# Patient Record
Sex: Female | Born: 1950
Health system: Southern US, Community
[De-identification: ages and names within clinical notes are randomized; demographics above are authoritative.]

## PROBLEM LIST (undated history)

## (undated) ENCOUNTER — Emergency Department (HOSPITAL_COMMUNITY): Payer: Self-pay | Source: Home / Self Care

## (undated) DIAGNOSIS — R519 Headache, unspecified: Secondary | ICD-10-CM

## (undated) DIAGNOSIS — F419 Anxiety disorder, unspecified: Secondary | ICD-10-CM

## (undated) DIAGNOSIS — R079 Chest pain, unspecified: Secondary | ICD-10-CM

## (undated) DIAGNOSIS — K449 Diaphragmatic hernia without obstruction or gangrene: Secondary | ICD-10-CM

## (undated) DIAGNOSIS — I10 Essential (primary) hypertension: Secondary | ICD-10-CM

## (undated) DIAGNOSIS — G8929 Other chronic pain: Secondary | ICD-10-CM

## (undated) DIAGNOSIS — E669 Obesity, unspecified: Secondary | ICD-10-CM

## (undated) DIAGNOSIS — K5792 Diverticulitis of intestine, part unspecified, without perforation or abscess without bleeding: Secondary | ICD-10-CM

## (undated) DIAGNOSIS — E559 Vitamin D deficiency, unspecified: Secondary | ICD-10-CM

## (undated) DIAGNOSIS — R109 Unspecified abdominal pain: Secondary | ICD-10-CM

## (undated) DIAGNOSIS — I509 Heart failure, unspecified: Secondary | ICD-10-CM

## (undated) DIAGNOSIS — E039 Hypothyroidism, unspecified: Secondary | ICD-10-CM

## (undated) DIAGNOSIS — E1165 Type 2 diabetes mellitus with hyperglycemia: Secondary | ICD-10-CM

## (undated) DIAGNOSIS — R51 Headache: Secondary | ICD-10-CM

## (undated) DIAGNOSIS — M549 Dorsalgia, unspecified: Secondary | ICD-10-CM

## (undated) DIAGNOSIS — IMO0002 Reserved for concepts with insufficient information to code with codable children: Secondary | ICD-10-CM

## (undated) DIAGNOSIS — J189 Pneumonia, unspecified organism: Secondary | ICD-10-CM

## (undated) DIAGNOSIS — E785 Hyperlipidemia, unspecified: Secondary | ICD-10-CM

## (undated) DIAGNOSIS — G5600 Carpal tunnel syndrome, unspecified upper limb: Secondary | ICD-10-CM

## (undated) DIAGNOSIS — G5620 Lesion of ulnar nerve, unspecified upper limb: Secondary | ICD-10-CM

## (undated) DIAGNOSIS — G629 Polyneuropathy, unspecified: Secondary | ICD-10-CM

## (undated) DIAGNOSIS — N189 Chronic kidney disease, unspecified: Secondary | ICD-10-CM

## (undated) HISTORY — DX: Reserved for concepts with insufficient information to code with codable children: IMO0002

## (undated) HISTORY — DX: Vitamin D deficiency, unspecified: E55.9

## (undated) HISTORY — DX: Unspecified abdominal pain: R10.9

## (undated) HISTORY — DX: Hypothyroidism, unspecified: E03.9

## (undated) HISTORY — PX: THYROID SURGERY: SHX805

## (undated) HISTORY — PX: KNEE SURGERY: SHX244

## (undated) HISTORY — DX: Carpal tunnel syndrome, unspecified upper limb: G56.00

## (undated) HISTORY — DX: Type 2 diabetes mellitus with hyperglycemia: E11.65

## (undated) HISTORY — DX: Lesion of ulnar nerve, unspecified upper limb: G56.20

---

## 1998-01-24 ENCOUNTER — Inpatient Hospital Stay (HOSPITAL_COMMUNITY): Admission: AD | Admit: 1998-01-24 | Discharge: 1998-01-24 | Payer: Self-pay | Admitting: Obstetrics & Gynecology

## 1998-02-07 ENCOUNTER — Other Ambulatory Visit: Admission: RE | Admit: 1998-02-07 | Discharge: 1998-02-07 | Payer: Self-pay | Admitting: Obstetrics

## 1998-02-07 ENCOUNTER — Encounter: Admission: RE | Admit: 1998-02-07 | Discharge: 1998-02-07 | Payer: Self-pay | Admitting: Obstetrics

## 1998-02-20 ENCOUNTER — Ambulatory Visit (HOSPITAL_COMMUNITY): Admission: RE | Admit: 1998-02-20 | Discharge: 1998-02-20 | Payer: Self-pay | Admitting: Obstetrics

## 1998-07-08 ENCOUNTER — Encounter: Payer: Self-pay | Admitting: Emergency Medicine

## 1998-07-08 ENCOUNTER — Emergency Department (HOSPITAL_COMMUNITY): Admission: EM | Admit: 1998-07-08 | Discharge: 1998-07-09 | Payer: Self-pay | Admitting: Radiology

## 1998-11-25 ENCOUNTER — Ambulatory Visit (HOSPITAL_COMMUNITY): Admission: RE | Admit: 1998-11-25 | Discharge: 1998-11-25 | Payer: Self-pay | Admitting: Gynecology

## 1998-11-25 ENCOUNTER — Encounter: Payer: Self-pay | Admitting: Internal Medicine

## 1998-11-28 ENCOUNTER — Encounter: Admission: RE | Admit: 1998-11-28 | Discharge: 1999-01-01 | Payer: Self-pay | Admitting: Internal Medicine

## 1999-03-14 ENCOUNTER — Emergency Department (HOSPITAL_COMMUNITY): Admission: EM | Admit: 1999-03-14 | Discharge: 1999-03-15 | Payer: Self-pay | Admitting: Emergency Medicine

## 1999-11-17 ENCOUNTER — Encounter: Payer: Self-pay | Admitting: Internal Medicine

## 1999-11-17 ENCOUNTER — Ambulatory Visit (HOSPITAL_COMMUNITY): Admission: RE | Admit: 1999-11-17 | Discharge: 1999-11-17 | Payer: Self-pay | Admitting: Internal Medicine

## 2000-01-18 ENCOUNTER — Emergency Department (HOSPITAL_COMMUNITY): Admission: EM | Admit: 2000-01-18 | Discharge: 2000-01-18 | Payer: Self-pay | Admitting: Emergency Medicine

## 2000-01-18 ENCOUNTER — Emergency Department (HOSPITAL_COMMUNITY): Admission: EM | Admit: 2000-01-18 | Discharge: 2000-01-19 | Payer: Self-pay | Admitting: Emergency Medicine

## 2000-01-18 ENCOUNTER — Encounter: Payer: Self-pay | Admitting: Emergency Medicine

## 2000-01-31 ENCOUNTER — Encounter: Payer: Self-pay | Admitting: Emergency Medicine

## 2000-01-31 ENCOUNTER — Emergency Department (HOSPITAL_COMMUNITY): Admission: EM | Admit: 2000-01-31 | Discharge: 2000-01-31 | Payer: Self-pay | Admitting: Emergency Medicine

## 2000-05-15 ENCOUNTER — Ambulatory Visit (HOSPITAL_COMMUNITY): Admission: RE | Admit: 2000-05-15 | Discharge: 2000-05-15 | Payer: Self-pay | Admitting: *Deleted

## 2000-05-15 ENCOUNTER — Encounter: Payer: Self-pay | Admitting: *Deleted

## 2001-03-05 ENCOUNTER — Encounter: Payer: Self-pay | Admitting: Emergency Medicine

## 2001-03-05 ENCOUNTER — Emergency Department (HOSPITAL_COMMUNITY): Admission: EM | Admit: 2001-03-05 | Discharge: 2001-03-05 | Payer: Self-pay | Admitting: Emergency Medicine

## 2001-11-17 ENCOUNTER — Encounter: Payer: Self-pay | Admitting: Internal Medicine

## 2001-11-17 ENCOUNTER — Encounter: Admission: RE | Admit: 2001-11-17 | Discharge: 2001-11-17 | Payer: Self-pay | Admitting: Internal Medicine

## 2002-02-25 ENCOUNTER — Emergency Department (HOSPITAL_COMMUNITY): Admission: EM | Admit: 2002-02-25 | Discharge: 2002-02-25 | Payer: Self-pay | Admitting: Emergency Medicine

## 2002-04-10 ENCOUNTER — Emergency Department (HOSPITAL_COMMUNITY): Admission: EM | Admit: 2002-04-10 | Discharge: 2002-04-11 | Payer: Self-pay | Admitting: Emergency Medicine

## 2002-04-11 ENCOUNTER — Encounter: Payer: Self-pay | Admitting: Emergency Medicine

## 2002-04-14 ENCOUNTER — Encounter: Payer: Self-pay | Admitting: Internal Medicine

## 2002-04-14 ENCOUNTER — Encounter: Admission: RE | Admit: 2002-04-14 | Discharge: 2002-04-14 | Payer: Self-pay | Admitting: Internal Medicine

## 2002-04-24 ENCOUNTER — Emergency Department (HOSPITAL_COMMUNITY): Admission: EM | Admit: 2002-04-24 | Discharge: 2002-04-24 | Payer: Self-pay | Admitting: Emergency Medicine

## 2002-08-21 ENCOUNTER — Ambulatory Visit (HOSPITAL_BASED_OUTPATIENT_CLINIC_OR_DEPARTMENT_OTHER): Admission: RE | Admit: 2002-08-21 | Discharge: 2002-08-21 | Payer: Self-pay | Admitting: Orthopedic Surgery

## 2002-09-04 ENCOUNTER — Encounter: Admission: RE | Admit: 2002-09-04 | Discharge: 2002-11-27 | Payer: Self-pay | Admitting: Orthopedic Surgery

## 2002-11-19 ENCOUNTER — Emergency Department (HOSPITAL_COMMUNITY): Admission: EM | Admit: 2002-11-19 | Discharge: 2002-11-19 | Payer: Self-pay | Admitting: Emergency Medicine

## 2003-03-06 ENCOUNTER — Encounter: Admission: RE | Admit: 2003-03-06 | Discharge: 2003-03-21 | Payer: Self-pay | Admitting: Orthopedic Surgery

## 2003-03-19 ENCOUNTER — Other Ambulatory Visit: Admission: RE | Admit: 2003-03-19 | Discharge: 2003-03-19 | Payer: Self-pay | Admitting: Obstetrics and Gynecology

## 2003-03-22 ENCOUNTER — Ambulatory Visit (HOSPITAL_COMMUNITY): Admission: RE | Admit: 2003-03-22 | Discharge: 2003-03-22 | Payer: Self-pay | Admitting: Obstetrics and Gynecology

## 2003-03-22 ENCOUNTER — Encounter: Payer: Self-pay | Admitting: Obstetrics and Gynecology

## 2003-06-14 ENCOUNTER — Encounter: Payer: Self-pay | Admitting: Internal Medicine

## 2003-06-14 ENCOUNTER — Encounter: Admission: RE | Admit: 2003-06-14 | Discharge: 2003-06-14 | Payer: Self-pay | Admitting: Internal Medicine

## 2003-09-30 ENCOUNTER — Emergency Department (HOSPITAL_COMMUNITY): Admission: AD | Admit: 2003-09-30 | Discharge: 2003-10-01 | Payer: Self-pay | Admitting: Emergency Medicine

## 2003-10-29 ENCOUNTER — Emergency Department (HOSPITAL_COMMUNITY): Admission: EM | Admit: 2003-10-29 | Discharge: 2003-10-29 | Payer: Self-pay | Admitting: Emergency Medicine

## 2003-12-21 ENCOUNTER — Emergency Department (HOSPITAL_COMMUNITY): Admission: EM | Admit: 2003-12-21 | Discharge: 2003-12-22 | Payer: Self-pay | Admitting: Emergency Medicine

## 2004-01-20 ENCOUNTER — Emergency Department (HOSPITAL_COMMUNITY): Admission: EM | Admit: 2004-01-20 | Discharge: 2004-01-20 | Payer: Self-pay | Admitting: Emergency Medicine

## 2004-01-21 ENCOUNTER — Emergency Department (HOSPITAL_COMMUNITY): Admission: EM | Admit: 2004-01-21 | Discharge: 2004-01-22 | Payer: Self-pay | Admitting: Emergency Medicine

## 2004-02-15 ENCOUNTER — Emergency Department (HOSPITAL_COMMUNITY): Admission: EM | Admit: 2004-02-15 | Discharge: 2004-02-15 | Payer: Self-pay | Admitting: Family Medicine

## 2004-02-16 ENCOUNTER — Emergency Department (HOSPITAL_COMMUNITY): Admission: EM | Admit: 2004-02-16 | Discharge: 2004-02-16 | Payer: Self-pay | Admitting: Emergency Medicine

## 2004-04-30 ENCOUNTER — Encounter: Admission: RE | Admit: 2004-04-30 | Discharge: 2004-04-30 | Payer: Self-pay | Admitting: Internal Medicine

## 2004-05-09 ENCOUNTER — Ambulatory Visit (HOSPITAL_COMMUNITY): Admission: RE | Admit: 2004-05-09 | Discharge: 2004-05-09 | Payer: Self-pay | Admitting: Obstetrics & Gynecology

## 2004-11-27 ENCOUNTER — Ambulatory Visit (HOSPITAL_COMMUNITY): Admission: RE | Admit: 2004-11-27 | Discharge: 2004-11-27 | Payer: Self-pay | Admitting: Obstetrics & Gynecology

## 2005-03-07 ENCOUNTER — Emergency Department (HOSPITAL_COMMUNITY): Admission: EM | Admit: 2005-03-07 | Discharge: 2005-03-07 | Payer: Self-pay | Admitting: Emergency Medicine

## 2005-06-24 ENCOUNTER — Emergency Department (HOSPITAL_COMMUNITY): Admission: EM | Admit: 2005-06-24 | Discharge: 2005-06-24 | Payer: Self-pay | Admitting: Emergency Medicine

## 2005-07-28 ENCOUNTER — Emergency Department (HOSPITAL_COMMUNITY): Admission: EM | Admit: 2005-07-28 | Discharge: 2005-07-29 | Payer: Self-pay | Admitting: *Deleted

## 2005-10-06 ENCOUNTER — Encounter: Admission: RE | Admit: 2005-10-06 | Discharge: 2005-10-06 | Payer: Self-pay | Admitting: Obstetrics & Gynecology

## 2005-10-30 ENCOUNTER — Encounter: Admission: RE | Admit: 2005-10-30 | Discharge: 2005-10-30 | Payer: Self-pay | Admitting: Obstetrics & Gynecology

## 2006-03-25 ENCOUNTER — Emergency Department (HOSPITAL_COMMUNITY): Admission: EM | Admit: 2006-03-25 | Discharge: 2006-03-25 | Payer: Self-pay | Admitting: Emergency Medicine

## 2006-09-29 ENCOUNTER — Emergency Department (HOSPITAL_COMMUNITY): Admission: EM | Admit: 2006-09-29 | Discharge: 2006-09-29 | Payer: Self-pay | Admitting: Emergency Medicine

## 2007-02-10 ENCOUNTER — Emergency Department (HOSPITAL_COMMUNITY): Admission: EM | Admit: 2007-02-10 | Discharge: 2007-02-10 | Payer: Self-pay | Admitting: Emergency Medicine

## 2007-04-10 ENCOUNTER — Emergency Department (HOSPITAL_COMMUNITY): Admission: EM | Admit: 2007-04-10 | Discharge: 2007-04-11 | Payer: Self-pay | Admitting: Emergency Medicine

## 2007-04-10 ENCOUNTER — Emergency Department (HOSPITAL_COMMUNITY): Admission: EM | Admit: 2007-04-10 | Discharge: 2007-04-10 | Payer: Self-pay | Admitting: Emergency Medicine

## 2007-07-22 ENCOUNTER — Emergency Department (HOSPITAL_COMMUNITY): Admission: EM | Admit: 2007-07-22 | Discharge: 2007-07-22 | Payer: Self-pay | Admitting: Emergency Medicine

## 2007-08-31 ENCOUNTER — Emergency Department (HOSPITAL_COMMUNITY): Admission: EM | Admit: 2007-08-31 | Discharge: 2007-08-31 | Payer: Self-pay | Admitting: Emergency Medicine

## 2007-12-15 ENCOUNTER — Emergency Department (HOSPITAL_COMMUNITY): Admission: EM | Admit: 2007-12-15 | Discharge: 2007-12-16 | Payer: Self-pay | Admitting: Emergency Medicine

## 2008-01-13 ENCOUNTER — Emergency Department (HOSPITAL_COMMUNITY): Admission: EM | Admit: 2008-01-13 | Discharge: 2008-01-14 | Payer: Self-pay | Admitting: Emergency Medicine

## 2008-05-23 ENCOUNTER — Encounter: Admission: RE | Admit: 2008-05-23 | Discharge: 2008-05-23 | Payer: Self-pay | Admitting: Internal Medicine

## 2008-11-03 ENCOUNTER — Emergency Department (HOSPITAL_COMMUNITY): Admission: EM | Admit: 2008-11-03 | Discharge: 2008-11-03 | Payer: Self-pay | Admitting: Emergency Medicine

## 2008-12-18 ENCOUNTER — Emergency Department (HOSPITAL_COMMUNITY): Admission: EM | Admit: 2008-12-18 | Discharge: 2008-12-18 | Payer: Self-pay | Admitting: Emergency Medicine

## 2009-07-05 ENCOUNTER — Emergency Department (HOSPITAL_COMMUNITY): Admission: EM | Admit: 2009-07-05 | Discharge: 2009-07-05 | Payer: Self-pay | Admitting: Emergency Medicine

## 2009-07-08 ENCOUNTER — Emergency Department (HOSPITAL_COMMUNITY): Admission: EM | Admit: 2009-07-08 | Discharge: 2009-07-08 | Payer: Self-pay | Admitting: Emergency Medicine

## 2009-08-14 ENCOUNTER — Emergency Department (HOSPITAL_COMMUNITY): Admission: EM | Admit: 2009-08-14 | Discharge: 2009-08-14 | Payer: Self-pay | Admitting: Emergency Medicine

## 2009-08-17 ENCOUNTER — Emergency Department (HOSPITAL_COMMUNITY): Admission: EM | Admit: 2009-08-17 | Discharge: 2009-08-17 | Payer: Self-pay | Admitting: Emergency Medicine

## 2009-08-20 ENCOUNTER — Emergency Department (HOSPITAL_COMMUNITY): Admission: EM | Admit: 2009-08-20 | Discharge: 2009-08-20 | Payer: Self-pay | Admitting: Emergency Medicine

## 2009-09-12 ENCOUNTER — Ambulatory Visit (HOSPITAL_COMMUNITY): Admission: RE | Admit: 2009-09-12 | Discharge: 2009-09-12 | Payer: Self-pay | Admitting: Obstetrics

## 2009-10-03 ENCOUNTER — Emergency Department (HOSPITAL_COMMUNITY): Admission: EM | Admit: 2009-10-03 | Discharge: 2009-10-03 | Payer: Self-pay | Admitting: Emergency Medicine

## 2009-11-03 ENCOUNTER — Emergency Department (HOSPITAL_COMMUNITY): Admission: EM | Admit: 2009-11-03 | Discharge: 2009-11-03 | Payer: Self-pay | Admitting: Emergency Medicine

## 2009-11-19 ENCOUNTER — Emergency Department (HOSPITAL_BASED_OUTPATIENT_CLINIC_OR_DEPARTMENT_OTHER): Admission: EM | Admit: 2009-11-19 | Discharge: 2009-11-19 | Payer: Self-pay | Admitting: Emergency Medicine

## 2010-01-17 ENCOUNTER — Emergency Department (HOSPITAL_COMMUNITY): Admission: EM | Admit: 2010-01-17 | Discharge: 2010-01-17 | Payer: Self-pay | Admitting: Emergency Medicine

## 2010-04-30 ENCOUNTER — Ambulatory Visit: Payer: Self-pay | Admitting: Internal Medicine

## 2010-09-24 ENCOUNTER — Encounter (INDEPENDENT_AMBULATORY_CARE_PROVIDER_SITE_OTHER): Payer: Self-pay | Admitting: *Deleted

## 2010-09-24 LAB — CONVERTED CEMR LAB
ALT: 13 units/L (ref 0–35)
AST: 15 units/L (ref 0–37)
Albumin: 4.2 g/dL (ref 3.5–5.2)
Alkaline Phosphatase: 41 units/L (ref 39–117)
Calcium: 8.6 mg/dL (ref 8.4–10.5)
Chloride: 104 meq/L (ref 96–112)
HCT: 39.4 % (ref 36.0–46.0)
HDL: 46 mg/dL (ref >39)
LDL Cholesterol: 62 mg/dL (ref 0–99)
MCHC: 33 g/dL (ref 30.0–36.0)
Platelets: 230 10*3/uL (ref 150–400)
Potassium: 4.4 meq/L (ref 3.5–5.3)
RDW: 13.5 % (ref 11.5–15.5)
TSH: 2.331 microintl units/mL (ref 0.350–4.500)
Triglycerides: 108 mg/dL (ref <150)

## 2010-10-11 ENCOUNTER — Encounter: Payer: Self-pay | Admitting: Obstetrics & Gynecology

## 2010-10-12 ENCOUNTER — Encounter: Payer: Self-pay | Admitting: Internal Medicine

## 2010-10-12 ENCOUNTER — Encounter: Payer: Self-pay | Admitting: Obstetrics & Gynecology

## 2010-10-13 ENCOUNTER — Encounter: Payer: Self-pay | Admitting: Internal Medicine

## 2010-11-18 ENCOUNTER — Institutional Professional Consult (permissible substitution): Payer: Self-pay | Admitting: Cardiovascular Disease

## 2010-12-09 LAB — GLUCOSE, CAPILLARY
Glucose-Capillary: 428 mg/dL — ABNORMAL HIGH (ref 70–99)
Glucose-Capillary: 517 mg/dL — ABNORMAL HIGH (ref 70–99)

## 2010-12-10 LAB — GLUCOSE, CAPILLARY

## 2010-12-15 LAB — CBC
HCT: 41.2 % (ref 36.0–46.0)
MCHC: 34.2 g/dL (ref 30.0–36.0)
MCV: 87.4 fL (ref 78.0–100.0)
Platelets: 263 10*3/uL (ref 150–400)
RDW: 12.3 % (ref 11.5–15.5)
WBC: 9.4 10*3/uL (ref 4.0–10.5)

## 2010-12-15 LAB — DIFFERENTIAL
Basophils Absolute: 0 10*3/uL (ref 0.0–0.1)
Basophils Relative: 0 % (ref 0–1)
Eosinophils Absolute: 0.1 10*3/uL (ref 0.0–0.7)
Eosinophils Relative: 1 % (ref 0–5)
Lymphs Abs: 1.7 10*3/uL (ref 0.7–4.0)
Neutrophils Relative %: 73 % (ref 43–77)

## 2010-12-15 LAB — URINALYSIS, ROUTINE W REFLEX MICROSCOPIC
Glucose, UA: >1000 mg/dL — AB
Ketones, ur: NEGATIVE mg/dL
Leukocytes, UA: NEGATIVE
Protein, ur: NEGATIVE mg/dL
Urobilinogen, UA: 0.2 mg/dL (ref 0.0–1.0)

## 2010-12-15 LAB — URINE MICROSCOPIC-ADD ON

## 2010-12-15 LAB — BASIC METABOLIC PANEL
BUN: 18 mg/dL (ref 6–23)
CO2: 32 mEq/L (ref 19–32)
Chloride: 98 mEq/L (ref 96–112)
Creatinine, Ser: 0.7 mg/dL (ref 0.4–1.2)
Glucose, Bld: 281 mg/dL — ABNORMAL HIGH (ref 70–99)
Potassium: 4.6 mEq/L (ref 3.5–5.1)

## 2010-12-24 LAB — DIFFERENTIAL
Basophils Absolute: 0 10*3/uL (ref 0.0–0.1)
Basophils Relative: 0 % (ref 0–1)
Eosinophils Absolute: 0.1 10*3/uL (ref 0.0–0.7)
Eosinophils Relative: 1 % (ref 0–5)
Monocytes Absolute: 0.6 10*3/uL (ref 0.1–1.0)
Neutro Abs: 4.5 10*3/uL (ref 1.7–7.7)

## 2010-12-24 LAB — BASIC METABOLIC PANEL
CO2: 28 mEq/L (ref 19–32)
Calcium: 9.4 mg/dL (ref 8.4–10.5)
Chloride: 102 mEq/L (ref 96–112)
Glucose, Bld: 295 mg/dL — ABNORMAL HIGH (ref 70–99)
Sodium: 138 mEq/L (ref 135–145)

## 2010-12-24 LAB — CBC
Hemoglobin: 13.3 g/dL (ref 12.0–15.0)
MCHC: 34.2 g/dL (ref 30.0–36.0)
MCV: 88 fL (ref 78.0–100.0)
RDW: 12.9 % (ref 11.5–15.5)

## 2010-12-24 LAB — GLUCOSE, CAPILLARY

## 2010-12-25 LAB — DIFFERENTIAL
Basophils Absolute: 0 10*3/uL (ref 0.0–0.1)
Basophils Relative: 0 % (ref 0–1)
Eosinophils Relative: 1 % (ref 0–5)
Lymphocytes Relative: 29 % (ref 12–46)
Monocytes Absolute: 0.7 10*3/uL (ref 0.1–1.0)

## 2010-12-25 LAB — BASIC METABOLIC PANEL
Chloride: 104 mEq/L (ref 96–112)
GFR calc Af Amer: >60 mL/min (ref >60)
GFR calc non Af Amer: >60 mL/min (ref >60)
Potassium: 3.5 mEq/L (ref 3.5–5.1)
Sodium: 138 mEq/L (ref 135–145)

## 2010-12-25 LAB — CBC
HCT: 36.6 % (ref 36.0–46.0)
Hemoglobin: 12.6 g/dL (ref 12.0–15.0)
MCHC: 34.4 g/dL (ref 30.0–36.0)
RDW: 12.9 % (ref 11.5–15.5)

## 2010-12-25 LAB — GLUCOSE, CAPILLARY

## 2011-01-09 ENCOUNTER — Ambulatory Visit (INDEPENDENT_AMBULATORY_CARE_PROVIDER_SITE_OTHER): Payer: Self-pay

## 2011-01-09 ENCOUNTER — Inpatient Hospital Stay (INDEPENDENT_AMBULATORY_CARE_PROVIDER_SITE_OTHER)
Admission: RE | Admit: 2011-01-09 | Discharge: 2011-01-09 | Disposition: A | Discharge status: Home / Self Care | Payer: Self-pay | Source: Ambulatory Visit | Attending: Family Medicine | Admitting: Family Medicine

## 2011-01-09 ENCOUNTER — Encounter (HOSPITAL_COMMUNITY): Payer: Self-pay

## 2011-01-09 DIAGNOSIS — J40 Bronchitis, not specified as acute or chronic: Secondary | ICD-10-CM

## 2011-01-21 ENCOUNTER — Emergency Department (HOSPITAL_COMMUNITY): Payer: Self-pay

## 2011-01-21 ENCOUNTER — Emergency Department (HOSPITAL_COMMUNITY)
Admission: EM | Admit: 2011-01-21 | Discharge: 2011-01-21 | Disposition: A | Discharge status: Home / Self Care | Payer: Self-pay | Attending: Emergency Medicine | Admitting: Emergency Medicine

## 2011-01-21 DIAGNOSIS — R1011 Right upper quadrant pain: Secondary | ICD-10-CM | POA: Insufficient documentation

## 2011-01-21 DIAGNOSIS — Z794 Long term (current) use of insulin: Secondary | ICD-10-CM | POA: Insufficient documentation

## 2011-01-21 DIAGNOSIS — E039 Hypothyroidism, unspecified: Secondary | ICD-10-CM | POA: Insufficient documentation

## 2011-01-21 DIAGNOSIS — E119 Type 2 diabetes mellitus without complications: Secondary | ICD-10-CM | POA: Insufficient documentation

## 2011-01-21 DIAGNOSIS — R079 Chest pain, unspecified: Secondary | ICD-10-CM | POA: Insufficient documentation

## 2011-01-21 DIAGNOSIS — I10 Essential (primary) hypertension: Secondary | ICD-10-CM | POA: Insufficient documentation

## 2011-01-21 LAB — COMPREHENSIVE METABOLIC PANEL
AST: 17 U/L (ref 0–37)
Albumin: 4.2 g/dL (ref 3.5–5.2)
BUN: 21 mg/dL (ref 6–23)
Calcium: 10.1 mg/dL (ref 8.4–10.5)
Creatinine, Ser: 0.94 mg/dL (ref 0.4–1.2)
GFR calc Af Amer: >60 mL/min (ref >60)
Total Protein: 6.7 g/dL (ref 6.0–8.3)

## 2011-01-21 LAB — DIFFERENTIAL
Eosinophils Relative: 2 % (ref 0–5)
Lymphocytes Relative: 36 % (ref 12–46)
Lymphs Abs: 2.2 10*3/uL (ref 0.7–4.0)
Neutro Abs: 3.1 10*3/uL (ref 1.7–7.7)

## 2011-01-21 LAB — CBC
HCT: 36.6 % (ref 36.0–46.0)
Hemoglobin: 12.5 g/dL (ref 12.0–15.0)
MCV: 85.7 fL (ref 78.0–100.0)
RDW: 12.8 % (ref 11.5–15.5)
WBC: 6.1 10*3/uL (ref 4.0–10.5)

## 2011-01-21 LAB — URINALYSIS, ROUTINE W REFLEX MICROSCOPIC
Bilirubin Urine: NEGATIVE
Glucose, UA: 500 mg/dL — AB
Hgb urine dipstick: NEGATIVE
Specific Gravity, Urine: 1.034 — ABNORMAL HIGH (ref 1.005–1.030)
Urobilinogen, UA: 0.2 mg/dL (ref 0.0–1.0)
pH: 5 (ref 5.0–8.0)

## 2011-01-21 LAB — URINE MICROSCOPIC-ADD ON

## 2011-01-21 LAB — GLUCOSE, CAPILLARY: Glucose-Capillary: 195 mg/dL — ABNORMAL HIGH (ref 70–99)

## 2011-01-22 LAB — URINE CULTURE
Culture  Setup Time: 201205020855
Culture: NO GROWTH

## 2011-02-06 NOTE — Op Note (Signed)
   NAME:  Anita James, Anita James                           ACCOUNT NO.:  [id number]   MEDICAL RECORD NO.:  [id number]                   PATIENT TYPE:  AMB   LOCATION:  DSC                                  FACILITY:  [facility]   PHYSICIAN:  Harvie Junior, M.D.                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   POSTOPERATIVE DIAGNOSES:  1. Chondromalacia patella.  2. Medial plica.  3. Tight lateral retinaculum.   PROCEDURES:  1. Debridement of chondromalacia patella.  2. Debridement of medial plica.  3. Lateral retinacular release.   SURGEON:  Dr. Luiz Blare.   ANESTHESIA:  General.   BRIEF HISTORY:  This is a 59 year old female with a long history of having  right knee pain.  She ultimately had an arthroscopy previously by Dr.  Magdalene River who ultimately found a lateral meniscal tear.  Because of  continued complaints of pain, and more recent onset of medial side pain, she  was brought to the operating room for operative arthroscopic.   DESCRIPTION OF PROCEDURE:  The patient was brought to the operating room,  after adequate general anesthesia was obtained, the patient was placed on  the operating table where the right leg then prepped and draped in the usual  sterile fashion.  Following arthroscopic examination, there was an obvious  medial plica, this was debrided back to a smooth, stable rim and there was  obvious chondral injury where the plica was abrading the femoral condyle.  The medial facet of the patella had significant chondromalacia, this was  debrided back to smooth and stable rim, this was a grade 4 change.  Because  of the grade 4 change and the lateral patellar tracking, tight lateral  retinaculum, the patient ultimately underwent a lateral retinacular release,  this was done arthroscopically  without significant abnormality.  At this  point, the knee was copiously irrigated, suctioned dry and the arthroscopic  portals were closed with bandage and sterile compressive dressing was  applied.  The patient was taken to the recovery room and was noted to be  satisfactory condition.   ESTIMATED BLOOD LOSS:  None.                                               Harvie Junior, M.D.    Ranae Plumber  D:  08/21/2002  T:  08/21/2002  Job:  [id number]

## 2011-05-29 ENCOUNTER — Inpatient Hospital Stay (INDEPENDENT_AMBULATORY_CARE_PROVIDER_SITE_OTHER)
Admission: RE | Admit: 2011-05-29 | Discharge: 2011-05-29 | Disposition: A | Discharge status: Home / Self Care | Payer: Self-pay | Source: Ambulatory Visit | Attending: Family Medicine | Admitting: Family Medicine

## 2011-05-29 DIAGNOSIS — L03211 Cellulitis of face: Secondary | ICD-10-CM

## 2011-06-15 LAB — CBC
HCT: 38.6
MCV: 87.1
Platelets: 258
RDW: 12.8
WBC: 7.3

## 2011-06-15 LAB — DIFFERENTIAL
Basophils Absolute: 0
Eosinophils Absolute: 0
Eosinophils Relative: 1
Lymphs Abs: 1.9
Neutrophils Relative %: 63

## 2011-06-15 LAB — BASIC METABOLIC PANEL
BUN: 11
Chloride: 101
Creatinine, Ser: 0.76
GFR calc non Af Amer: >60
Glucose, Bld: 363 — ABNORMAL HIGH
Potassium: 4.2

## 2011-06-15 LAB — D-DIMER, QUANTITATIVE: D-Dimer, Quant: 0.33

## 2011-06-15 LAB — POCT CARDIAC MARKERS: Operator id: 1192

## 2011-06-16 LAB — BASIC METABOLIC PANEL
Calcium: 9.2
GFR calc Af Amer: >60
GFR calc non Af Amer: >60
Glucose, Bld: 136 — ABNORMAL HIGH
Potassium: 4.1
Sodium: 139

## 2011-06-16 LAB — URINE MICROSCOPIC-ADD ON

## 2011-06-16 LAB — CBC
HCT: 40.8
Hemoglobin: 14.5
RBC: 4.65
RDW: 12.8
WBC: 9.3

## 2011-06-16 LAB — URINALYSIS, ROUTINE W REFLEX MICROSCOPIC
Bilirubin Urine: NEGATIVE
Glucose, UA: NEGATIVE
Hgb urine dipstick: NEGATIVE
Ketones, ur: NEGATIVE
Specific Gravity, Urine: 1.029
pH: 6

## 2011-06-16 LAB — DIFFERENTIAL
Basophils Absolute: 0
Lymphocytes Relative: 9 — ABNORMAL LOW
Lymphs Abs: 0.8
Monocytes Absolute: 0.8
Neutro Abs: 7.6

## 2011-06-16 LAB — POCT CARDIAC MARKERS
CKMB, poc: 1.1
Troponin i, poc: <0.05

## 2011-06-29 LAB — URINE MICROSCOPIC-ADD ON

## 2011-06-29 LAB — URINALYSIS, ROUTINE W REFLEX MICROSCOPIC
Nitrite: NEGATIVE
Specific Gravity, Urine: 1.028
pH: 8

## 2011-06-29 LAB — URINE CULTURE

## 2011-07-01 LAB — ETHANOL: Alcohol, Ethyl (B): <5

## 2011-07-06 LAB — URINALYSIS, ROUTINE W REFLEX MICROSCOPIC
Bilirubin Urine: NEGATIVE
Nitrite: NEGATIVE
Specific Gravity, Urine: 1.018
Urobilinogen, UA: 1

## 2011-07-06 LAB — URINE MICROSCOPIC-ADD ON

## 2011-07-24 ENCOUNTER — Emergency Department (HOSPITAL_COMMUNITY)
Admission: EM | Admit: 2011-07-24 | Discharge: 2011-07-25 | Disposition: A | Discharge status: Home / Self Care | Payer: Self-pay | Attending: Emergency Medicine | Admitting: Emergency Medicine

## 2011-07-24 ENCOUNTER — Emergency Department (HOSPITAL_COMMUNITY): Payer: Self-pay

## 2011-07-24 DIAGNOSIS — E039 Hypothyroidism, unspecified: Secondary | ICD-10-CM | POA: Insufficient documentation

## 2011-07-24 DIAGNOSIS — M25519 Pain in unspecified shoulder: Secondary | ICD-10-CM | POA: Insufficient documentation

## 2011-07-24 DIAGNOSIS — Z79899 Other long term (current) drug therapy: Secondary | ICD-10-CM | POA: Insufficient documentation

## 2011-07-24 DIAGNOSIS — E119 Type 2 diabetes mellitus without complications: Secondary | ICD-10-CM | POA: Insufficient documentation

## 2011-07-24 DIAGNOSIS — I10 Essential (primary) hypertension: Secondary | ICD-10-CM | POA: Insufficient documentation

## 2011-07-24 DIAGNOSIS — E7800 Pure hypercholesterolemia, unspecified: Secondary | ICD-10-CM | POA: Insufficient documentation

## 2011-07-24 DIAGNOSIS — R071 Chest pain on breathing: Secondary | ICD-10-CM | POA: Insufficient documentation

## 2011-07-24 DIAGNOSIS — R0602 Shortness of breath: Secondary | ICD-10-CM | POA: Insufficient documentation

## 2011-07-24 LAB — DIFFERENTIAL
Basophils Absolute: 0 10*3/uL (ref 0.0–0.1)
Eosinophils Relative: 1 % (ref 0–5)
Lymphocytes Relative: 33 % (ref 12–46)
Monocytes Absolute: 0.7 10*3/uL (ref 0.1–1.0)

## 2011-07-24 LAB — BASIC METABOLIC PANEL
CO2: 25 mEq/L (ref 19–32)
Calcium: 9.9 mg/dL (ref 8.4–10.5)
Glucose, Bld: 230 mg/dL — ABNORMAL HIGH (ref 70–99)
Potassium: 3.4 mEq/L — ABNORMAL LOW (ref 3.5–5.1)
Sodium: 138 mEq/L (ref 135–145)

## 2011-07-24 LAB — CBC
HCT: 39.1 % (ref 36.0–46.0)
MCHC: 34.8 g/dL (ref 30.0–36.0)
RDW: 12.9 % (ref 11.5–15.5)

## 2011-07-24 LAB — PROTIME-INR: INR: 0.94 (ref 0.00–1.49)

## 2011-07-25 LAB — POCT I-STAT TROPONIN I: Troponin i, poc: 0 ng/mL (ref 0.00–0.08)

## 2011-08-07 ENCOUNTER — Emergency Department (HOSPITAL_BASED_OUTPATIENT_CLINIC_OR_DEPARTMENT_OTHER)
Admission: EM | Admit: 2011-08-07 | Discharge: 2011-08-08 | Disposition: A | Discharge status: Home / Self Care | Payer: Self-pay | Attending: Emergency Medicine | Admitting: Emergency Medicine

## 2011-08-07 ENCOUNTER — Encounter (HOSPITAL_BASED_OUTPATIENT_CLINIC_OR_DEPARTMENT_OTHER): Payer: Self-pay | Admitting: Emergency Medicine

## 2011-08-07 ENCOUNTER — Emergency Department (INDEPENDENT_AMBULATORY_CARE_PROVIDER_SITE_OTHER): Payer: Self-pay

## 2011-08-07 DIAGNOSIS — K5792 Diverticulitis of intestine, part unspecified, without perforation or abscess without bleeding: Secondary | ICD-10-CM

## 2011-08-07 DIAGNOSIS — E119 Type 2 diabetes mellitus without complications: Secondary | ICD-10-CM | POA: Insufficient documentation

## 2011-08-07 DIAGNOSIS — E785 Hyperlipidemia, unspecified: Secondary | ICD-10-CM | POA: Insufficient documentation

## 2011-08-07 DIAGNOSIS — I10 Essential (primary) hypertension: Secondary | ICD-10-CM | POA: Insufficient documentation

## 2011-08-07 DIAGNOSIS — R350 Frequency of micturition: Secondary | ICD-10-CM | POA: Insufficient documentation

## 2011-08-07 DIAGNOSIS — R109 Unspecified abdominal pain: Secondary | ICD-10-CM | POA: Insufficient documentation

## 2011-08-07 DIAGNOSIS — K5732 Diverticulitis of large intestine without perforation or abscess without bleeding: Secondary | ICD-10-CM | POA: Insufficient documentation

## 2011-08-07 DIAGNOSIS — E079 Disorder of thyroid, unspecified: Secondary | ICD-10-CM | POA: Insufficient documentation

## 2011-08-07 DIAGNOSIS — R300 Dysuria: Secondary | ICD-10-CM

## 2011-08-07 HISTORY — DX: Diaphragmatic hernia without obstruction or gangrene: K44.9

## 2011-08-07 HISTORY — DX: Essential (primary) hypertension: I10

## 2011-08-07 HISTORY — DX: Hyperlipidemia, unspecified: E78.5

## 2011-08-07 LAB — URINALYSIS, ROUTINE W REFLEX MICROSCOPIC
Bilirubin Urine: NEGATIVE
Glucose, UA: 100 mg/dL — AB
Ketones, ur: 15 mg/dL — AB
Nitrite: NEGATIVE
Protein, ur: 100 mg/dL — AB
Specific Gravity, Urine: 1.031 — ABNORMAL HIGH (ref 1.005–1.030)
Urobilinogen, UA: 1 mg/dL (ref 0.0–1.0)
pH: 5.5 (ref 5.0–8.0)

## 2011-08-07 LAB — BASIC METABOLIC PANEL
BUN: 11 mg/dL (ref 6–23)
CO2: 26 mEq/L (ref 19–32)
Calcium: 9.1 mg/dL (ref 8.4–10.5)
Chloride: 100 mEq/L (ref 96–112)
Creatinine, Ser: 0.6 mg/dL (ref 0.50–1.10)
GFR calc Af Amer: >90 mL/min (ref >90)
GFR calc non Af Amer: >90 mL/min (ref >90)
Glucose, Bld: 285 mg/dL — ABNORMAL HIGH (ref 70–99)
Potassium: 4.3 mEq/L (ref 3.5–5.1)
Sodium: 138 mEq/L (ref 135–145)

## 2011-08-07 LAB — URINE MICROSCOPIC-ADD ON

## 2011-08-07 LAB — CBC
HCT: 38.8 % (ref 36.0–46.0)
Hemoglobin: 13.5 g/dL (ref 12.0–15.0)
MCH: 29.3 pg (ref 26.0–34.0)
MCHC: 34.8 g/dL (ref 30.0–36.0)
MCV: 84.3 fL (ref 78.0–100.0)
Platelets: 260 10*3/uL (ref 150–400)
RBC: 4.6 MIL/uL (ref 3.87–5.11)
RDW: 13 % (ref 11.5–15.5)
WBC: 9.2 10*3/uL (ref 4.0–10.5)

## 2011-08-07 MED ORDER — METRONIDAZOLE 500 MG PO TABS: 500.0000 mg | ORAL_TABLET | Freq: Three times a day (TID) | ORAL | Status: DC

## 2011-08-07 MED ORDER — CIPROFLOXACIN IN D5W 400 MG/200ML IV SOLN
400.0000 mg | Freq: Once | INTRAVENOUS | Status: AC
  Administered 2011-08-07: 400 mg via INTRAVENOUS
  Filled 2011-08-07: qty 200

## 2011-08-07 MED ORDER — METRONIDAZOLE IN NACL 5-0.79 MG/ML-% IV SOLN
500.0000 mg | Freq: Once | INTRAVENOUS | Status: AC
  Administered 2011-08-07: 500 mg via INTRAVENOUS
  Filled 2011-08-07: qty 100

## 2011-08-07 MED ORDER — SODIUM CHLORIDE 0.9 % IV BOLUS (SEPSIS)
1000.0000 mL | Freq: Once | INTRAVENOUS | Status: AC
  Administered 2011-08-07: 1000 mL via INTRAVENOUS

## 2011-08-07 MED ORDER — MORPHINE SULFATE 4 MG/ML IJ SOLN
6.0000 mg | Freq: Once | INTRAMUSCULAR | Status: AC
  Administered 2011-08-07: 6 mg via INTRAVENOUS
  Filled 2011-08-07: qty 2

## 2011-08-07 MED ORDER — CIPROFLOXACIN HCL 500 MG PO TABS: 500.0000 mg | ORAL_TABLET | Freq: Two times a day (BID) | ORAL | Status: DC

## 2011-08-07 MED ORDER — OXYCODONE-ACETAMINOPHEN 5-325 MG PO TABS
1.0000 | ORAL_TABLET | Freq: Once | ORAL | Status: AC
  Administered 2011-08-07: 1 via ORAL
  Filled 2011-08-07: qty 1

## 2011-08-07 NOTE — ED Notes (Signed)
Pt c/o urinary frequency and dysuria

## 2011-08-07 NOTE — ED Notes (Signed)
Dr Juleen China informed that the patient has called out again requesting pain medications.  No orders received.

## 2011-08-07 NOTE — ED Notes (Signed)
Pt has called out requesting pain medication, Dr Juleen China informed, no orders received.

## 2011-08-07 NOTE — ED Notes (Addendum)
When reassessing pt's pain after admin of pain medication, pt was asked to rate her pain on a 0-10 scale.  Pt stated, "I'm still hurting"  When asked again what number her pain was on a scale of 0-10 she quipped, "I told you I am still hurting, that means it is a 10/10."

## 2011-08-07 NOTE — ED Notes (Signed)
Pt changed to observation status while IV abx continue infusing.  Will discharge patient as soon those abx infusions are complete.

## 2011-08-08 NOTE — ED Provider Notes (Signed)
History    70yf with lower abdominal pain. Onset shortly before arrival. Feels a lot of pressure. " I feel like there is something coming out." No urinary complaints. Has not felt mass or bulge. Denies hx of prolapse. No hx of similar symptoms. No n/v. No fever or chills. Pain has been relatively constant since onset. Feels pressure like needs to urinate. Uncomfortable when pees. Denies hx of kidney stones. No blood in urine. CSN: [id number] Arrival date & time: 08/07/2011  7:29 PM   First MD Initiated Contact with Patient 08/07/11 1931      Chief Complaint  Patient presents with  . Dysuria  . Urinary Frequency    (Consider location/radiation/quality/duration/timing/severity/associated sxs/prior treatment) HPI  Past Medical History  Diagnosis Date  . Diabetes mellitus   . Hypertension   . Thyroid disease   . Hiatal hernia   . Hyperlipemia     Past Surgical History  Procedure Date  . Cesarean section   . Knee surgery   . Thyroid surgery     History reviewed. No pertinent family history.  History  Substance Use Topics  . Smoking status: Never Smoker   . Smokeless tobacco: Not on file  . Alcohol Use: No    OB History    Grav Para Term Preterm Abortions TAB SAB Ect Mult Living                  Review of Systems   Review of symptoms negative unless otherwise noted in HPI.   Allergies  Sulfa antibiotics and Augmentin  Home Medications   Current Outpatient Rx  Name Route Sig Dispense Refill  . CIPROFLOXACIN HCL 500 MG PO TABS Oral Take 1 tablet (500 mg total) by mouth 2 (two) times daily. 14 tablet 0  . METRONIDAZOLE 500 MG PO TABS Oral Take 1 tablet (500 mg total) by mouth 3 (three) times daily. 21 tablet 0    BP 144/77  Pulse 74  Temp(Src) 97.7 F (36.5 C) (Oral)  Resp 17  Ht 5\' 7"  (1.702 m)  Wt 240 lb (108.863 kg)  BMI 37.59 kg/m2  SpO2 95%  Physical Exam  Nursing note and vitals reviewed. Constitutional: She appears well-developed and  well-nourished. No distress.       obese  HENT:  Head: Normocephalic and atraumatic.  Eyes: Conjunctivae are normal. Right eye exhibits no discharge. Left eye exhibits no discharge.  Neck: Neck supple.  Cardiovascular: Normal rate, regular rhythm and normal heart sounds.  Exam reveals no gallop and no friction rub.   No murmur heard. Pulmonary/Chest: Effort normal and breath sounds normal. No respiratory distress.  Abdominal: Soft. She exhibits no distension. There is tenderness. There is no rebound and no guarding.       Mild lower abdominal tenderness without guarding or rebound  Genitourinary:       Normal external female genitalia. No uterine or vaginal prolapse. No concerning lesions noted or significant discharge.  Musculoskeletal: She exhibits no edema and no tenderness.  Neurological: She is alert.  Skin: Skin is warm and dry. No pallor.  Psychiatric: Thought content normal.    ED Course  Procedures (including critical care time)  Labs Reviewed  URINALYSIS, ROUTINE W REFLEX MICROSCOPIC - Abnormal; Notable for the following:    Color, Urine AMBER (*) BIOCHEMICALS MAY BE AFFECTED BY COLOR   Appearance CLOUDY (*)    Specific Gravity, Urine 1.031 (*)    Glucose, UA 100 (*)    Hgb urine dipstick LARGE (*)  Ketones, ur 15 (*)    Protein, ur 100 (*)    Leukocytes, UA MODERATE (*)    All other components within normal limits  URINE MICROSCOPIC-ADD ON - Abnormal; Notable for the following:    Squamous Epithelial / LPF FEW (*)    Bacteria, UA FEW (*)    All other components within normal limits  BASIC METABOLIC PANEL - Abnormal; Notable for the following:    Glucose, Bld 285 (*)    All other components within normal limits  CBC   Ct Abdomen Pelvis Wo Contrast  08/07/2011  *RADIOLOGY REPORT*  Clinical Data: Dysuria, urinary frequency  CT ABDOMEN AND PELVIS WITHOUT CONTRAST  Technique:  Multidetector CT imaging of the abdomen and pelvis was performed following the standard  protocol without intravenous contrast.  Comparison: None.  Findings: Lung bases are clear.  Unenhanced liver is notable for probable hepatic steatosis.  Spleen, pancreas, adrenal glands within normal limits.  Gallbladder is unremarkable.  No intrahepatic or extrahepatic ductal dilatation.  Kidneys are unremarkable.  No renal calculi or hydronephrosis.  No evidence of bowel obstruction.  Normal appendix.  Mild diverticulosis with very mild inflammatory changes involving a loop of proximal sigmoid colon (series 2/image 72), possibly reflecting very mild diverticulitis.  No drainable fluid collection or abscess.  No free air.  No evidence of abdominal aortic aneurysm.  No abdominopelvic ascites.  No suspicious abdominopelvic lymphadenopathy.  No ureteral or bladder calculi.  Bladder is underdistended.  Fat-containing right inguinal hernia.  Mild degenerative changes of the visualized thoracolumbar spine.  IMPRESSION: No renal, ureteral, or bladder calculi.  No hydronephrosis.  Bladder is underdistended.  Mild inflammatory changes involving a loop of proximal sigmoid colon, possibly reflecting very mild diverticulitis.  Probable hepatic steatosis.  Original Report Authenticated By: Charline Bills, M.D.     1. Diverticulitis   2. Abdominal pain       MDM  60yf with lower abdominal pain. CT with evidence of possible sigmoid diverticulitis.  Treating as potential etiology of symptoms. UA not convincing for UTI. Cipro should cover anyways. Consider prolapse but no evidence on exam. DC with scripts and outpt fu.        Raeford Razor, MD 08/08/11 [phone]

## 2011-08-08 NOTE — ED Notes (Signed)
Pt has been given discharge paperwork, IV DC'd.  Pt was informed that she cannot drive home due to narcotic administration.  Pt states that she does not have a ride home and cannot get anyone to bring her back to the [facility] to pick up her vehicle if she is sent home with a taxi voucher.  Pt states that she will be stranded.  Pt was offered to stay at the [facility] until 0400 at which time she should no longer be under the influence of narcotics.  Pt has accepted this offer.

## 2011-08-08 NOTE — ED Notes (Signed)
Pt states that she wants to make sure that her discharge prescriptions are on the $4 list at [facility]  She also wishes to speak with the physician again.  Dr Juleen China informed, he is to speak with the patient again and verify discharge rx's.

## 2011-08-10 ENCOUNTER — Emergency Department (HOSPITAL_COMMUNITY)
Admission: EM | Admit: 2011-08-10 | Discharge: 2011-08-11 | Disposition: A | Discharge status: Home / Self Care | Payer: Self-pay | Attending: Emergency Medicine | Admitting: Emergency Medicine

## 2011-08-10 ENCOUNTER — Encounter (HOSPITAL_COMMUNITY): Payer: Self-pay | Admitting: *Deleted

## 2011-08-10 DIAGNOSIS — E119 Type 2 diabetes mellitus without complications: Secondary | ICD-10-CM | POA: Insufficient documentation

## 2011-08-10 DIAGNOSIS — N390 Urinary tract infection, site not specified: Secondary | ICD-10-CM | POA: Insufficient documentation

## 2011-08-10 DIAGNOSIS — R197 Diarrhea, unspecified: Secondary | ICD-10-CM | POA: Insufficient documentation

## 2011-08-10 DIAGNOSIS — I10 Essential (primary) hypertension: Secondary | ICD-10-CM | POA: Insufficient documentation

## 2011-08-10 DIAGNOSIS — R112 Nausea with vomiting, unspecified: Secondary | ICD-10-CM | POA: Insufficient documentation

## 2011-08-10 HISTORY — DX: Diverticulitis of intestine, part unspecified, without perforation or abscess without bleeding: K57.92

## 2011-08-10 LAB — CBC
HCT: 37.5 % (ref 36.0–46.0)
Hemoglobin: 13.5 g/dL (ref 12.0–15.0)
MCH: 30.2 pg (ref 26.0–34.0)
MCV: 83.9 fL (ref 78.0–100.0)
RBC: 4.47 MIL/uL (ref 3.87–5.11)

## 2011-08-10 LAB — COMPREHENSIVE METABOLIC PANEL
AST: 32 U/L (ref 0–37)
BUN: 6 mg/dL (ref 6–23)
CO2: 28 mEq/L (ref 19–32)
Chloride: 100 mEq/L (ref 96–112)
Creatinine, Ser: 0.61 mg/dL (ref 0.50–1.10)
GFR calc non Af Amer: >90 mL/min (ref >90)
Glucose, Bld: 183 mg/dL — ABNORMAL HIGH (ref 70–99)
Total Bilirubin: 0.3 mg/dL (ref 0.3–1.2)

## 2011-08-10 LAB — LIPASE, BLOOD: Lipase: 26 U/L (ref 11–59)

## 2011-08-10 MED ORDER — PROMETHAZINE HCL 25 MG RE SUPP: 25.0000 mg | Freq: Four times a day (QID) | RECTAL | Status: DC | PRN

## 2011-08-10 MED ORDER — ONDANSETRON 4 MG PO TBDP
4.0000 mg | ORAL_TABLET | Freq: Once | ORAL | Status: AC
  Administered 2011-08-10: 4 mg via ORAL
  Filled 2011-08-10: qty 1

## 2011-08-10 MED ORDER — ONDANSETRON HCL 4 MG/2ML IJ SOLN
4.0000 mg | Freq: Once | INTRAMUSCULAR | Status: AC
  Administered 2011-08-10: 4 mg via INTRAVENOUS
  Filled 2011-08-10: qty 2

## 2011-08-10 MED ORDER — HYDROCODONE-ACETAMINOPHEN 5-500 MG PO TABS: 1.0000 | ORAL_TABLET | Freq: Four times a day (QID) | ORAL | Status: DC | PRN

## 2011-08-10 MED ORDER — PROMETHAZINE HCL 25 MG RE SUPP: 25.0000 mg | Freq: Four times a day (QID) | RECTAL | Status: AC | PRN

## 2011-08-10 MED ORDER — SODIUM CHLORIDE 0.9 % IV BOLUS (SEPSIS)
1000.0000 mL | Freq: Once | INTRAVENOUS | Status: AC
  Administered 2011-08-10: 1000 mL via INTRAVENOUS

## 2011-08-10 MED ORDER — CEPHALEXIN 500 MG PO CAPS: 500.0000 mg | ORAL_CAPSULE | Freq: Four times a day (QID) | ORAL | Status: AC

## 2011-08-10 MED ORDER — HYDROCODONE-ACETAMINOPHEN 5-500 MG PO TABS: 1.0000 | ORAL_TABLET | Freq: Four times a day (QID) | ORAL | Status: AC | PRN

## 2011-08-10 NOTE — ED Notes (Signed)
Pt says that when she takes her medication for diverticulitis (flagyl and cipro)  she has vomiting and diarrhea. Symptoms have been occuring since she was released on saturday

## 2011-08-10 NOTE — ED Provider Notes (Signed)
History     CSN: [id number] Arrival date & time: 08/10/2011  5:10 PM   First MD Initiated Contact with Patient 08/10/11 2102      Chief Complaint  Patient presents with  . Medication Reaction    (Consider location/radiation/quality/duration/timing/severity/associated sxs/prior treatment) HPI Patient reports 4 days of nausea vomiting and diarrhea.  She was seen 3 days ago and was started on ciprofloxacin and Flagyl for possible diverticulitis.  The patient reports she continues to vomit sometimes it occurs 45 minutes after taking the ciprofloxacin and she believes it may be secondary to the antibiotics.  The patient however is having nausea and vomiting in between antibiotics as well.  Her vomiting is nonbloody and nonbilious.  Her diarrhea is watery without blood.  She's had no fevers or chills.  She reports continued fullness in her suprapubic region without dysuria or frank urinary frequency.  Nothing worsens her symptoms.  Nothing improves her symptoms.  She is a diabetic.  Her symptoms are constant  Past Medical History  Diagnosis Date  . Diabetes mellitus   . Hypertension   . Thyroid disease   . Hiatal hernia   . Hyperlipemia   . Diverticulitis     Past Surgical History  Procedure Date  . Cesarean section   . Knee surgery   . Thyroid surgery     No family history on file.  History  Substance Use Topics  . Smoking status: Never Smoker   . Smokeless tobacco: Not on file  . Alcohol Use: No    OB History    Grav Para Term Preterm Abortions TAB SAB Ect Mult Living                  Review of Systems  All other systems reviewed and are negative.    Allergies  Sulfa antibiotics and Augmentin  Home Medications   Current Outpatient Rx  Name Route Sig Dispense Refill  . AMLODIPINE BESYLATE 10 MG PO TABS Oral Take 10 mg by mouth daily.      . FENOFIBRATE 145 MG PO TABS Oral Take 145 mg by mouth daily.      . INSULIN ASPART 100 UNIT/ML [place] SOLN Subcutaneous  Inject 20 Units into the skin 4 (four) times daily.      . INSULIN DETEMIR 100 UNIT/ML [place] SOLN Subcutaneous Inject 60 Units into the skin at bedtime.      Marland Kitchen LEVOTHYROXINE SODIUM 100 MCG PO TABS Oral Take 100 mcg by mouth daily.      Marland Kitchen LISINOPRIL 40 MG PO TABS Oral Take 40 mg by mouth daily.      Marland Kitchen METFORMIN HCL 1000 MG PO TABS Oral Take 1,000 mg by mouth 2 (two) times daily with a meal.      . METOPROLOL TARTRATE 50 MG PO TABS Oral Take 50 mg by mouth 2 (two) times daily.      Marland Kitchen PANTOPRAZOLE SODIUM 40 MG PO TBEC Oral Take 40 mg by mouth daily.      Marland Kitchen SIMVASTATIN 20 MG PO TABS Oral Take 20 mg by mouth at bedtime.      . CEPHALEXIN 500 MG PO CAPS Oral Take 1 capsule (500 mg total) by mouth 4 (four) times daily. 20 capsule 0  . HYDROCODONE-ACETAMINOPHEN 5-500 MG PO TABS Oral Take 1 tablet by mouth every 6 (six) hours as needed for pain. 15 tablet 0  . PROMETHAZINE HCL 25 MG RE SUPP Rectal Place 1 suppository (25 mg total) rectally every 6 (  six) hours as needed for nausea. 12 each 0    BP 149/90  Pulse 70  Temp(Src) 98.2 F (36.8 C) (Oral)  Resp 16  Ht 5\' 6"  (1.676 m)  Wt 240 lb (108.863 kg)  BMI 38.74 kg/m2  SpO2 98%  Physical Exam  Nursing note and vitals reviewed. Constitutional: She is oriented to person, place, and time. She appears well-developed and well-nourished. No distress.  HENT:  Head: Normocephalic and atraumatic.  Eyes: EOM are normal.  Neck: Normal range of motion.  Cardiovascular: Normal rate, regular rhythm and normal heart sounds.   Pulmonary/Chest: Effort normal and breath sounds normal.  Abdominal: Soft. She exhibits no distension. There is no tenderness.  Musculoskeletal: Normal range of motion.  Neurological: She is alert and oriented to person, place, and time.  Skin: Skin is warm and dry.  Psychiatric: She has a normal mood and affect. Judgment normal.    ED Course  Procedures (including critical care time)  Labs Reviewed  GLUCOSE, CAPILLARY - Abnormal;  Notable for the following:    Glucose-Capillary 138 (*)    All other components within normal limits  COMPREHENSIVE METABOLIC PANEL - Abnormal; Notable for the following:    Glucose, Bld 183 (*)    All other components within normal limits  CBC  LIPASE, BLOOD   No results found.   1. Nausea, vomiting and diarrhea   2. Urinary tract infection       MDM   Patient feels much better in the emergency department.  I reviewed her CT scan from several days ago which was underwelming on CT scan.  Suspect is a viral gastroenteritis given her nausea vomiting or diarrhea.  Her abdomen is completely benign on exam.  Will cover urine with Keflex.  Urine culture sent.  DC home with close followup with her PCP.       Lyanne Co, MD 08/10/11 [phone]

## 2011-08-10 NOTE — ED Notes (Addendum)
Pt to ER with possible medication reaction. Pt states that she was seen at [facility] due to not being able to urinate. Pt states was dx with diverticulitis. Pt states was discharged with 2 antibiotics and states every time  she takes the medications she vomits. Pt states she drinks tea and water to take meds. Pt denies any abd pain or dysuria. Pt also reports diarrhea

## 2011-08-10 NOTE — ED Notes (Signed)
Pt given discharge instructions and verbalizes understanding  

## 2011-08-26 ENCOUNTER — Encounter: Payer: Self-pay | Admitting: Cardiovascular Disease

## 2011-08-27 ENCOUNTER — Ambulatory Visit: Payer: Self-pay | Admitting: Cardiovascular Disease

## 2011-09-18 ENCOUNTER — Inpatient Hospital Stay (HOSPITAL_COMMUNITY)
Admission: AD | Admit: 2011-09-18 | Discharge: 2011-09-18 | Disposition: A | Discharge status: Home / Self Care | Payer: Self-pay | Source: Ambulatory Visit | Attending: Obstetrics and Gynecology | Admitting: Obstetrics and Gynecology

## 2011-09-18 ENCOUNTER — Encounter (HOSPITAL_COMMUNITY): Payer: Self-pay | Admitting: *Deleted

## 2011-09-18 ENCOUNTER — Inpatient Hospital Stay (HOSPITAL_COMMUNITY)
Admission: EM | Admit: 2011-09-18 | Discharge: 2011-09-18 | Disposition: A | Discharge status: Home / Self Care | Payer: Self-pay | Attending: Obstetrics and Gynecology | Admitting: Obstetrics and Gynecology

## 2011-09-18 DIAGNOSIS — L738 Other specified follicular disorders: Secondary | ICD-10-CM | POA: Insufficient documentation

## 2011-09-18 DIAGNOSIS — L0231 Cutaneous abscess of buttock: Secondary | ICD-10-CM | POA: Insufficient documentation

## 2011-09-18 DIAGNOSIS — L0232 Furuncle of buttock: Secondary | ICD-10-CM

## 2011-09-18 DIAGNOSIS — N764 Abscess of vulva: Secondary | ICD-10-CM | POA: Insufficient documentation

## 2011-09-18 DIAGNOSIS — L739 Follicular disorder, unspecified: Secondary | ICD-10-CM

## 2011-09-18 DIAGNOSIS — L0233 Carbuncle of buttock: Secondary | ICD-10-CM

## 2011-09-18 MED ORDER — CIPROFLOXACIN HCL 500 MG PO TABS: 500.0000 mg | ORAL_TABLET | Freq: Two times a day (BID) | ORAL | Status: AC

## 2011-09-18 NOTE — ED Provider Notes (Signed)
History     Chief Complaint  Patient presents with  . Recurrent Skin Infections   HPI This is a 60 y.o. female who presents with complaints of 2 boils which started yesterday.  Has tried warm soaks without success. One is on buttocks and is draining, and the other is on the left labia.   OB History    Grav Para Term Preterm Abortions TAB SAB Ect Mult Living   4 4 3 1      6       Past Medical History  Diagnosis Date  . Diabetes mellitus   . Hypertension   . Thyroid disease   . Hiatal hernia   . Hyperlipemia   . Diverticulitis     Past Surgical History  Procedure Date  . Cesarean section   . Knee surgery   . Thyroid surgery     No family history on file.  History  Substance Use Topics  . Smoking status: Never Smoker   . Smokeless tobacco: Not on file  . Alcohol Use: No    Allergies:  Allergies  Allergen Reactions  . Sulfa Antibiotics Shortness Of Breath  . Augmentin     Combination of medications tear lining of stomach    Prescriptions prior to admission  Medication Sig Dispense Refill  . amLODipine (NORVASC) 10 MG tablet Take 10 mg by mouth daily.        . fenofibrate (TRICOR) 145 MG tablet Take 145 mg by mouth daily.        Marland Kitchen ibuprofen (ADVIL,MOTRIN) 200 MG tablet Take 200 mg by mouth every 6 (six) hours as needed. For pain       . insulin aspart (NOVOLOG) 100 UNIT/ML injection Inject 20 Units into the skin 4 (four) times daily.        . insulin detemir (LEVEMIR) 100 UNIT/ML injection Inject 60 Units into the skin at bedtime.        Marland Kitchen levothyroxine (SYNTHROID, LEVOTHROID) 100 MCG tablet Take 100 mcg by mouth daily.        Marland Kitchen lisinopril (PRINIVIL,ZESTRIL) 40 MG tablet Take 40 mg by mouth daily.        . metFORMIN (GLUCOPHAGE) 1000 MG tablet Take 1,000 mg by mouth 2 (two) times daily with a meal.        . metoprolol (LOPRESSOR) 50 MG tablet Take 50 mg by mouth 2 (two) times daily.        . pantoprazole (PROTONIX) 40 MG tablet Take 40 mg by mouth daily.          . simvastatin (ZOCOR) 20 MG tablet Take 20 mg by mouth at bedtime.          ROS See above  Physical Exam   Blood pressure 139/75, pulse 94, temperature 98.3 F (36.8 C), resp. rate 18, height 5\' 7"  (1.702 m), weight 251 lb 12.8 oz (114.216 kg).  Physical Exam  Constitutional: She is oriented to person, place, and time. She appears well-developed and well-nourished. No distress.  HENT:  Head: Normocephalic.  Cardiovascular: Normal rate.   Respiratory: Effort normal.  GI: Soft. There is no tenderness.  Genitourinary: Vagina normal.       There is a 2cm round abscess on left labia (well above [place]) which is superficial and has a scratch on surface but is not draining. Firm and not fluctuant.  There is also a 1mm draining furuncle on left buttock with minimal erethema.  Musculoskeletal: Normal range of motion.  Neurological: She is alert  and oriented to person, place, and time.  Skin: Skin is warm and dry.  Psychiatric: She has a normal mood and affect.    MAU Course  Procedures  Assessment and Plan  A:  Furunculosis/folliculitis      Buttock and labial abscesses P:  Unable to prescribe sulfa and keflex due to allergies       Rx cipro 500mg  bid       Recommend warm soaks      Abscess on labia is not ready for I&D      followup with family doctor  [facility] 09/18/2011, 4:50 AM

## 2011-09-18 NOTE — Progress Notes (Signed)
Pt reports 2 boils, one on labia and one by her rectum.  Pt has soaked in hot baths-no relief.

## 2011-09-21 NOTE — ED Provider Notes (Signed)
Agree with above note.  Anita James 09/21/2011 1:10 PM

## 2011-12-30 ENCOUNTER — Emergency Department (HOSPITAL_BASED_OUTPATIENT_CLINIC_OR_DEPARTMENT_OTHER)
Admission: EM | Admit: 2011-12-30 | Discharge: 2011-12-30 | Disposition: A | Discharge status: Home / Self Care | Payer: Self-pay | Attending: Emergency Medicine | Admitting: Emergency Medicine

## 2011-12-30 ENCOUNTER — Encounter (HOSPITAL_BASED_OUTPATIENT_CLINIC_OR_DEPARTMENT_OTHER): Payer: Self-pay | Admitting: *Deleted

## 2011-12-30 ENCOUNTER — Emergency Department (INDEPENDENT_AMBULATORY_CARE_PROVIDER_SITE_OTHER): Payer: Self-pay

## 2011-12-30 DIAGNOSIS — Z79899 Other long term (current) drug therapy: Secondary | ICD-10-CM | POA: Insufficient documentation

## 2011-12-30 DIAGNOSIS — E785 Hyperlipidemia, unspecified: Secondary | ICD-10-CM | POA: Insufficient documentation

## 2011-12-30 DIAGNOSIS — J3489 Other specified disorders of nose and nasal sinuses: Secondary | ICD-10-CM

## 2011-12-30 DIAGNOSIS — J329 Chronic sinusitis, unspecified: Secondary | ICD-10-CM | POA: Insufficient documentation

## 2011-12-30 DIAGNOSIS — E079 Disorder of thyroid, unspecified: Secondary | ICD-10-CM | POA: Insufficient documentation

## 2011-12-30 DIAGNOSIS — E119 Type 2 diabetes mellitus without complications: Secondary | ICD-10-CM | POA: Insufficient documentation

## 2011-12-30 DIAGNOSIS — R51 Headache: Secondary | ICD-10-CM

## 2011-12-30 DIAGNOSIS — I10 Essential (primary) hypertension: Secondary | ICD-10-CM | POA: Insufficient documentation

## 2011-12-30 LAB — COMPREHENSIVE METABOLIC PANEL
Albumin: 4.1 g/dL (ref 3.5–5.2)
Alkaline Phosphatase: 47 U/L (ref 39–117)
BUN: 14 mg/dL (ref 6–23)
Potassium: 4 mEq/L (ref 3.5–5.1)
Total Protein: 7.2 g/dL (ref 6.0–8.3)

## 2011-12-30 LAB — SEDIMENTATION RATE: Sed Rate: 5 mm/hr (ref 0–22)

## 2011-12-30 LAB — ACETAMINOPHEN LEVEL: Acetaminophen (Tylenol), Serum: <15 ug/mL (ref 10–30)

## 2011-12-30 MED ORDER — METOCLOPRAMIDE HCL 5 MG/ML IJ SOLN
10.0000 mg | Freq: Once | INTRAMUSCULAR | Status: AC
  Administered 2011-12-30: 10 mg via INTRAVENOUS
  Filled 2011-12-30: qty 2

## 2011-12-30 MED ORDER — KETOROLAC TROMETHAMINE 30 MG/ML IJ SOLN
30.0000 mg | Freq: Once | INTRAMUSCULAR | Status: AC
  Administered 2011-12-30: 30 mg via INTRAVENOUS
  Filled 2011-12-30: qty 1

## 2011-12-30 MED ORDER — HYDROCODONE-ACETAMINOPHEN 5-500 MG PO TABS
1.0000 | ORAL_TABLET | Freq: Four times a day (QID) | ORAL | Status: AC | PRN
Start: 2011-12-30 — End: 2012-01-09

## 2011-12-30 MED ORDER — CEPHALEXIN 250 MG PO CAPS
500.0000 mg | ORAL_CAPSULE | Freq: Once | ORAL | Status: AC
  Administered 2011-12-30: 500 mg via ORAL
  Filled 2011-12-30: qty 2

## 2011-12-30 MED ORDER — SODIUM CHLORIDE 0.9 % IV BOLUS (SEPSIS)
1000.0000 mL | Freq: Once | INTRAVENOUS | Status: AC
  Administered 2011-12-30: 1000 mL via INTRAVENOUS

## 2011-12-30 MED ORDER — DIPHENHYDRAMINE HCL 50 MG/ML IJ SOLN
25.0000 mg | Freq: Once | INTRAMUSCULAR | Status: AC
  Administered 2011-12-30: 23:00:00 via INTRAVENOUS
  Filled 2011-12-30: qty 1

## 2011-12-30 MED ORDER — CEPHALEXIN 500 MG PO CAPS: 500.0000 mg | ORAL_CAPSULE | Freq: Four times a day (QID) | ORAL | Status: AC

## 2011-12-30 NOTE — Discharge Instructions (Signed)
Headache Headaches are caused by many different problems. Most commonly, headache is caused by muscle tension from an injury, fatigue, or emotional upset. Excessive muscle contractions in the scalp and neck result in a headache that often feels like a tight band around the head. Tension headaches often have areas of tenderness over the scalp and the back of the neck. These headaches may last for hours, days, or longer, and some may contribute to migraines in those who have migraine problems. Migraines usually cause a throbbing headache, which is made worse by activity. Sometimes only one side of the head hurts. Nausea, vomiting, eye pain, and avoidance of food are common with migraines. Visual symptoms such as light sensitivity, blind spots, or flashing lights may also occur. Loud noises may worsen migraine headaches. Many factors may cause migraine headaches:  Emotional stress, lack of sleep, and menstrual periods.   Alcohol and some drugs (such as birth control pills).   Diet factors (fasting, caffeine, food preservatives, chocolate).   Environmental factors (weather changes, bright lights, odors, smoke).  Other causes of headaches include minor injuries to the head. Arthritis in the neck; problems with the jaw, eyes, ears, or nose are also causes of headaches. Allergies, drugs, alcohol, and exposure to smoke can also cause moderate headaches. Rebound headaches can occur if someone uses pain medications for a long period of time and then stops. Less commonly, blood vessel problems in the neck and brain (including stroke) can cause various types of headache. Treatment of headaches includes medicines for pain and relaxation. Ice packs or heat applied to the back of the head and neck help some people. Massaging the shoulders, neck and scalp are often very useful. Relaxation techniques and stretching can help prevent these headaches. Avoid alcohol and cigarette smoking as these tend to make headaches  worse. Please see your caregiver if your headache is not better in 2 days.  SEEK IMMEDIATE MEDICAL CARE IF:   You develop a high fever, chills, or repeated vomiting.   You faint or have difficulty with vision.   You develop unusual numbness or weakness of your arms or legs.   Relief of pain is inadequate with medication, or you develop severe pain.   You develop confusion, or neck stiffness.   You have a worsening of a headache or do not obtain relief.  Document Released: 09/07/2005 Document Revised: 08/27/2011 Document Reviewed: 03/03/2007 [facility] Patient Information 2012 [place], [place].  Sinusitis Sinuses are air pockets within the bones of your face. The growth of bacteria within a sinus leads to infection. The infection prevents the sinuses from draining. This infection is called sinusitis. SYMPTOMS  There will be different areas of pain depending on which sinuses have become infected.  The maxillary sinuses often produce pain beneath the eyes.   Frontal sinusitis may cause pain in the middle of the forehead and above the eyes.  Other problems (symptoms) include:  Toothaches.   Colored, pus-like (purulent) drainage from the nose.   Swelling, warmth, and tenderness over the sinus areas may be signs of infection.  TREATMENT  Sinusitis is most often determined by an exam.X-rays may be taken. If x-rays have been taken, make sure you obtain your results or find out how you are to obtain them. Your caregiver may give you medications (antibiotics). These are medications that will help kill the bacteria causing the infection. You may also be given a medication (decongestant) that helps to reduce sinus swelling.  HOME CARE INSTRUCTIONS   Only take over-the-counter  or prescription medicines for pain, discomfort, or fever as directed by your caregiver.   Drink extra fluids. Fluids help thin the mucus so your sinuses can drain more easily.   Applying either moist heat or ice packs  to the sinus areas may help relieve discomfort.   Use saline nasal sprays to help moisten your sinuses. The sprays can be found at your local drugstore.  SEEK IMMEDIATE MEDICAL CARE IF:  You have a fever.   You have increasing pain, severe headaches, or toothache.   You have nausea, vomiting, or drowsiness.   You develop unusual swelling around the face or trouble seeing.  MAKE SURE YOU:   Understand these instructions.   Will watch your condition.   Will get help right away if you are not doing well or get worse.  Document Released: 09/07/2005 Document Revised: 08/27/2011 Document Reviewed: 04/06/2007 [facility] Patient Information 2012 [place], [place].

## 2011-12-30 NOTE — ED Notes (Signed)
Patient states she has a headache for the last 3 weeks.  Describes headache as constant and has intermittent sharp throbbing pain in the right side of her head.

## 2011-12-30 NOTE — ED Provider Notes (Signed)
History     CSN: [id number]  Arrival date & time 12/30/11  2056   First MD Initiated Contact with Patient 12/30/11 2207      Chief Complaint  Patient presents with  . Headache    (Consider location/radiation/quality/duration/timing/severity/associated sxs/prior treatment) Patient is a 61 y.o. female presenting with headaches. The history is provided by the patient. No language interpreter was used.  Headache  This is a new problem. Episode onset: 3 weeks ago. The problem occurs constantly. The problem has been gradually worsening. The pain is located in the right unilateral region. The quality of the pain is described as throbbing. The pain is moderate. The pain does not radiate. Pertinent negatives include no anorexia, no fever, no malaise/fatigue, no near-syncope, no palpitations, no shortness of breath, no nausea and no vomiting. She has tried acetaminophen for the symptoms. The treatment provided moderate relief.    Past Medical History  Diagnosis Date  . Diabetes mellitus   . Hypertension   . Thyroid disease   . Hiatal hernia   . Hyperlipemia   . Diverticulitis     Past Surgical History  Procedure Date  . Cesarean section   . Knee surgery   . Thyroid surgery     No family history on file.  History  Substance Use Topics  . Smoking status: Never Smoker   . Smokeless tobacco: Not on file  . Alcohol Use: No    OB History    Grav Para Term Preterm Abortions TAB SAB Ect Mult Living   4 4 3 1      6       Review of Systems  Constitutional: Negative for fever, malaise/fatigue, activity change, appetite change and fatigue.  HENT: Negative for congestion, sore throat, rhinorrhea, neck pain and neck stiffness.   Eyes: Negative for photophobia, discharge and itching.  Respiratory: Negative for cough and shortness of breath.   Cardiovascular: Negative for chest pain, palpitations and near-syncope.  Gastrointestinal: Negative for nausea, vomiting, abdominal pain and  anorexia.  Genitourinary: Negative for dysuria, urgency, frequency and flank pain.  Musculoskeletal: Negative for myalgias, back pain and arthralgias.  Neurological: Positive for headaches. Negative for dizziness, weakness, light-headedness and numbness.  All other systems reviewed and are negative.    Allergies  Sulfa antibiotics and Augmentin  Home Medications   Current Outpatient Rx  Name Route Sig Dispense Refill  . AMLODIPINE BESYLATE 10 MG PO TABS Oral Take 10 mg by mouth daily.      . FENOFIBRATE 145 MG PO TABS Oral Take 145 mg by mouth daily.      . IBUPROFEN 200 MG PO TABS Oral Take 200 mg by mouth every 6 (six) hours as needed. For pain     . INSULIN ASPART 100 UNIT/ML [place] SOLN Subcutaneous Inject 20 Units into the skin 4 (four) times daily.      . INSULIN GLARGINE 100 UNIT/ML [place] SOLN Subcutaneous Inject 50 Units into the skin at bedtime.    Marland Kitchen LEVOTHYROXINE SODIUM 100 MCG PO TABS Oral Take 100 mcg by mouth daily.      Marland Kitchen LISINOPRIL 40 MG PO TABS Oral Take 40 mg by mouth daily.      Marland Kitchen METFORMIN HCL 1000 MG PO TABS Oral Take 1,000 mg by mouth 2 (two) times daily with a meal.      . METOPROLOL TARTRATE 50 MG PO TABS Oral Take 50 mg by mouth 2 (two) times daily.      Marland Kitchen PANTOPRAZOLE SODIUM 40  MG PO TBEC Oral Take 40 mg by mouth daily.      Marland Kitchen SIMVASTATIN 20 MG PO TABS Oral Take 20 mg by mouth at bedtime.      . CEPHALEXIN 500 MG PO CAPS Oral Take 1 capsule (500 mg total) by mouth 4 (four) times daily. 28 capsule 0  . HYDROCODONE-ACETAMINOPHEN 5-500 MG PO TABS Oral Take 1-2 tablets by mouth every 6 (six) hours as needed for pain. 15 tablet 0  . INSULIN DETEMIR 100 UNIT/ML [place] SOLN Subcutaneous Inject 60 Units into the skin at bedtime.        BP 161/93  Pulse 80  Temp(Src) 97.6 F (36.4 C) (Oral)  Resp 20  Ht 5' 6.5" (1.689 m)  Wt 260 lb (117.935 kg)  BMI 41.34 kg/m2  SpO2 99%  Physical Exam  Nursing note and vitals reviewed. Constitutional: She is oriented to person, place,  and time. She appears well-developed and well-nourished. No distress.  HENT:  Head: Normocephalic and atraumatic.  Mouth/Throat: Oropharynx is clear and moist.  Eyes: Conjunctivae and EOM are normal. Pupils are equal, round, and reactive to light.       No photophobia  Neck: Normal range of motion. Neck supple.  Cardiovascular: Normal rate, regular rhythm, normal heart sounds and intact distal pulses.  Exam reveals no gallop and no friction rub.   No murmur heard. Pulmonary/Chest: Effort normal and breath sounds normal. No respiratory distress. She exhibits no tenderness.  Abdominal: Soft. Bowel sounds are normal. There is no tenderness. There is no rebound and no guarding.  Musculoskeletal: Normal range of motion. She exhibits no edema and no tenderness.  Neurological: She is alert and oriented to person, place, and time. No cranial nerve deficit.  Skin: Skin is warm and dry. No rash noted.    ED Course  Procedures (including critical care time)  Labs Reviewed  COMPREHENSIVE METABOLIC PANEL - Abnormal; Notable for the following:    Glucose, Bld 299 (*)    All other components within normal limits  GLUCOSE, CAPILLARY - Abnormal; Notable for the following:    Glucose-Capillary 286 (*)    All other components within normal limits  ACETAMINOPHEN LEVEL  SEDIMENTATION RATE   Ct Head Wo Contrast  12/30/2011  *RADIOLOGY REPORT*  Clinical Data: Headache for 3 days.  CT HEAD WITHOUT CONTRAST  Technique:  Contiguous axial images were obtained from the base of the skull through the vertex without contrast.  Comparison: CT of the head performed 11/03/2008  Findings: There is no evidence of acute infarction, mass lesion, or intra- or extra-axial hemorrhage on CT.  Prominence of the sulci reflects mild cortical volume loss.  Mild cerebellar atrophy is noted.  The brainstem and fourth ventricle are within normal limits.  The third and lateral ventricles, and basal ganglia are unremarkable in  appearance.  The cerebral hemispheres are symmetric in appearance, with normal gray-white differentiation.  No mass effect or midline shift is seen.  There is no evidence of fracture; visualized osseous structures are unremarkable in appearance.  The visualized portions of the orbits are within normal limits.  There is mild partial opacification of the right side of the sphenoid sinus; the remaining paranasal sinuses and mastoid air cells are well-aerated.  No significant soft tissue abnormalities are seen.  IMPRESSION:  1.  No acute intracranial pathology seen on CT. 2.  Mild cortical volume loss noted. 3.  Mild partial opacification of the right side of the sphenoid sinus.  Original Report Authenticated By:  JEFFREY CHANG, M.D.     1. Headache   2. Sinusitis       MDM  Headache likely secondary to sinusitis. Headache resolved with a migraine cocktail. She received her first dose of Keflex here. She is afebrile. She'll be discharged home with a prescription for Keflex. Patient is self-pay and has limited resources therefore I will not prescribe doxycycline or additional expensive antibiotic. [facility]. I have no concern about a malignant cause of headache such as subarachnoid hemorrhage or meningitis. Symptoms have been present intermittently for 3 weeks.        Dayton Bailiff, MD 12/30/11 2351

## 2012-03-16 ENCOUNTER — Ambulatory Visit (HOSPITAL_COMMUNITY)
Admission: RE | Admit: 2012-03-16 | Discharge: 2012-03-16 | Disposition: A | Discharge status: Home / Self Care | Payer: Self-pay | Source: Ambulatory Visit | Attending: Family Medicine | Admitting: Family Medicine

## 2012-03-16 DIAGNOSIS — R0609 Other forms of dyspnea: Secondary | ICD-10-CM | POA: Insufficient documentation

## 2012-03-16 DIAGNOSIS — I10 Essential (primary) hypertension: Secondary | ICD-10-CM | POA: Insufficient documentation

## 2012-03-16 DIAGNOSIS — R609 Edema, unspecified: Secondary | ICD-10-CM | POA: Insufficient documentation

## 2012-03-16 DIAGNOSIS — E119 Type 2 diabetes mellitus without complications: Secondary | ICD-10-CM | POA: Insufficient documentation

## 2012-03-16 DIAGNOSIS — R0989 Other specified symptoms and signs involving the circulatory and respiratory systems: Secondary | ICD-10-CM | POA: Insufficient documentation

## 2012-03-16 DIAGNOSIS — R079 Chest pain, unspecified: Secondary | ICD-10-CM | POA: Insufficient documentation

## 2012-03-16 DIAGNOSIS — E785 Hyperlipidemia, unspecified: Secondary | ICD-10-CM | POA: Insufficient documentation

## 2012-03-16 DIAGNOSIS — R0602 Shortness of breath: Secondary | ICD-10-CM

## 2012-03-16 DIAGNOSIS — I369 Nonrheumatic tricuspid valve disorder, unspecified: Secondary | ICD-10-CM

## 2012-03-16 DIAGNOSIS — R635 Abnormal weight gain: Secondary | ICD-10-CM

## 2012-08-11 ENCOUNTER — Emergency Department (HOSPITAL_COMMUNITY)
Admission: EM | Admit: 2012-08-11 | Discharge: 2012-08-11 | Disposition: A | Discharge status: Home / Self Care | Payer: Self-pay | Source: Home / Self Care

## 2012-08-11 ENCOUNTER — Encounter (HOSPITAL_COMMUNITY): Payer: Self-pay | Admitting: *Deleted

## 2012-08-11 DIAGNOSIS — E119 Type 2 diabetes mellitus without complications: Secondary | ICD-10-CM

## 2012-08-11 MED ORDER — INSULIN DETEMIR 100 UNIT/ML ~~LOC~~ SOLN: 60.0000 [IU] | Freq: Every day | SUBCUTANEOUS | Status: DC

## 2012-08-11 MED ORDER — LISINOPRIL 40 MG PO TABS: 40.0000 mg | ORAL_TABLET | Freq: Every day | ORAL | Status: DC

## 2012-08-11 MED ORDER — PANTOPRAZOLE SODIUM 40 MG PO TBEC: 40.0000 mg | DELAYED_RELEASE_TABLET | Freq: Two times a day (BID) | ORAL | Status: DC

## 2012-08-11 MED ORDER — AMLODIPINE BESYLATE 10 MG PO TABS: 10.0000 mg | ORAL_TABLET | Freq: Every day | ORAL | Status: DC

## 2012-08-11 MED ORDER — POTASSIUM CHLORIDE ER 10 MEQ PO TBCR: 10.0000 meq | EXTENDED_RELEASE_TABLET | Freq: Two times a day (BID) | ORAL | Status: DC

## 2012-08-11 MED ORDER — SIMVASTATIN 20 MG PO TABS: 20.0000 mg | ORAL_TABLET | Freq: Every day | ORAL | Status: DC

## 2012-08-11 MED ORDER — METFORMIN HCL 1000 MG PO TABS: 1000.0000 mg | ORAL_TABLET | Freq: Two times a day (BID) | ORAL | Status: DC

## 2012-08-11 MED ORDER — METOPROLOL TARTRATE 50 MG PO TABS: 50.0000 mg | ORAL_TABLET | Freq: Two times a day (BID) | ORAL | Status: DC

## 2012-08-11 MED ORDER — FUROSEMIDE 40 MG PO TABS: 40.0000 mg | ORAL_TABLET | Freq: Every day | ORAL | Status: DC

## 2012-08-11 MED ORDER — FENOFIBRATE 145 MG PO TABS: 145.0000 mg | ORAL_TABLET | Freq: Every day | ORAL | Status: DC

## 2012-08-11 MED ORDER — LEVOTHYROXINE SODIUM 100 MCG PO TABS: 100.0000 ug | ORAL_TABLET | Freq: Every day | ORAL | Status: DC

## 2012-08-11 MED ORDER — INSULIN ASPART 100 UNIT/ML ~~LOC~~ SOLN: 20.0000 [IU] | Freq: Every day | SUBCUTANEOUS | Status: DC

## 2012-08-11 MED ORDER — GABAPENTIN 300 MG PO CAPS: 300.0000 mg | ORAL_CAPSULE | Freq: Four times a day (QID) | ORAL | Status: DC

## 2012-08-11 MED ORDER — EYE VITAMINS PO CAPS: ORAL_CAPSULE | ORAL | Status: DC

## 2012-08-11 NOTE — ED Provider Notes (Signed)
Patient Demographics  Anita James, is a 61 y.o. female  JXB:[id number]  ZHY:[id number]  DOB - 1951/03/01  Chief Complaint  Patient presents with  . Medication Refill        Subjective:   Anita James today has, No headache, [name], [name], [name], [name]   Objective:    Filed Vitals:   08/11/12 1645  BP: 140/61  Pulse: 99  Temp: 98.3 F (36.8 C)  TempSrc: Oral  Resp: 20  SpO2: 100%     Exam  Awake Alert, Oriented X 3, No new F.N deficits, Normal affect [place].AT,PERRAL Supple Neck,No JVD, No cervical lymphadenopathy appriciated.  Symmetrical Chest wall movement, Good air movement bilaterally, CTAB RRR,No Gallops,Rubs or new Murmurs, No Parasternal Heave +ve B.Sounds, Abd Soft, Non tender, No organomegaly appriciated, No rebound - guarding or rigidity. No Cyanosis, Clubbing or edema, No new Rash or bruise      Data Review   CBC No results found for this basename: WBC:5,HGB:5,HCT:5,PLT:5,MCV:5,MCH:5,MCHC:5,RDW:5,NEUTRABS:5,LYMPHSABS:5,MONOABS:5,EOSABS:5,BASOSABS:5,BANDABS:5,BANDSABD:5 in the last 168 hours  Chemistries   No results found for this basename: NA:5,K:5,CL:5,CO2:5,GLUCOSE:5,BUN:5,CREATININE:5,GFRCGP,:5,CALCIUM:5,MG:5,AST:5,ALT:5,ALKPHOS:5,BILITOT:5 in the last 168 hours ------------------------------------------------------------------------------------------------------------------ No results found for this basename: HGBA1C:2 in the last 72 hours ------------------------------------------------------------------------------------------------------------------ No results found for this basename: CHOL:2,HDL:2,LDLCALC:2,TRIG:2,CHOLHDL:2,LDLDIRECT:2 in the last 72 hours ------------------------------------------------------------------------------------------------------------------ No results found for this basename: TSH,T4TOTAL,FREET3,T3FREE,THYROIDAB in the last 72  hours ------------------------------------------------------------------------------------------------------------------ No results found for this basename: VITAMINB12:2,FOLATE:2,FERRITIN:2,TIBC:2,IRON:2,RETICCTPCT:2 in the last 72 hours  Coagulation profile  No results found for this basename: INR:5,PROTIME:5 in the last 168 hours  Urinalysis    Component Value Date/Time   COLORURINE AMBER* 08/07/2011 1925   APPEARANCEUR CLOUDY* 08/07/2011 1925   LABSPEC 1.031* 08/07/2011 1925   PHURINE 5.5 08/07/2011 1925   GLUCOSEU 100* 08/07/2011 1925   HGBUR LARGE* 08/07/2011 1925   BILIRUBINUR NEGATIVE 08/07/2011 1925   KETONESUR 15* 08/07/2011 1925   PROTEINUR 100* 08/07/2011 1925   UROBILINOGEN 1.0 08/07/2011 1925   NITRITE NEGATIVE 08/07/2011 1925   LEUKOCYTESUR MODERATE* 08/07/2011 1925     Prior to Admission medications   Medication Sig Start Date End Date Taking? Authorizing Provider  amLODipine (NORVASC) 10 MG tablet Take 1 tablet (10 mg total) by mouth daily. 08/11/12   Leroy Sea, MD  fenofibrate (TRICOR) 145 MG tablet Take 1 tablet (145 mg total) by mouth daily. 08/11/12   Leroy Sea, MD  furosemide (LASIX) 40 MG tablet Take 1 tablet (40 mg total) by mouth daily. 08/11/12   Leroy Sea, MD  gabapentin (NEURONTIN) 300 MG capsule Take 1 capsule (300 mg total) by mouth 4 (four) times daily. 08/11/12   Leroy Sea, MD  ibuprofen (ADVIL,MOTRIN) 200 MG tablet Take 200 mg by mouth every 6 (six) hours as needed. For pain     Historical Provider, MD  insulin aspart (NOVOLOG) 100 UNIT/ML injection Inject 20 Units into the skin 5 (five) times daily. 08/11/12   Leroy Sea, MD  insulin detemir (LEVEMIR) 100 UNIT/ML injection Inject 60 Units into the skin at bedtime. 08/11/12   Leroy Sea, MD  levothyroxine (SYNTHROID, LEVOTHROID) 100 MCG tablet Take 1 tablet (100 mcg total) by mouth daily. 08/11/12   Leroy Sea, MD  lisinopril (PRINIVIL,ZESTRIL) 40 MG  tablet Take 1 tablet (40 mg total) by mouth daily. 08/11/12   Leroy Sea, MD  metFORMIN (GLUCOPHAGE) 1000 MG tablet Take 1 tablet (1,000 mg total) by mouth 2 (two) times daily with a  meal. 08/11/12   Leroy Sea, MD  metoprolol (LOPRESSOR) 50 MG tablet Take 1 tablet (50 mg total) by mouth 2 (two) times daily. 08/11/12   Leroy Sea, MD  Multiple Vitamins-Minerals (EYE VITAMINS) CAPS 1 pill Po daily, 1 month supply 08/11/12   Leroy Sea, MD  pantoprazole (PROTONIX) 40 MG tablet Take 1 tablet (40 mg total) by mouth 2 (two) times daily. 08/11/12   Leroy Sea, MD  potassium chloride (K-DUR) 10 MEQ tablet Take 1 tablet (10 mEq total) by mouth 2 (two) times daily. 08/11/12   Leroy Sea, MD  simvastatin (ZOCOR) 20 MG tablet Take 1 tablet (20 mg total) by mouth at bedtime. 08/11/12   Leroy Sea, MD     Assessment & Plan   Patient with history of diabetes mellitus type 2 and hypertension along with dyslipidemia here for routine followup visit. She has no subjective complaints however has run out of her blood pressure medications 3 days ago. Her primary reason for today's visit is to get her medications refilled.  Her medications for diabetes mellitus and hypertension have been refilled, she has been given instructions on Accu-Cheks, will order an A1c, we'll request her to come back in 2-3 weeks with her Accu-Chek log for followup for sugar control along with her A1c followup.    Leroy Sea M.D on 08/11/2012 at 4:52 PM  Leroy Sea, MD 08/11/12 [phone]

## 2012-08-11 NOTE — ED Notes (Signed)
Pt reports needing med refill.  

## 2012-08-12 LAB — HEMOGLOBIN A1C: Mean Plasma Glucose: 255 mg/dL — ABNORMAL HIGH (ref <117)

## 2012-08-16 MED ORDER — INSULIN GLARGINE 100 UNIT/ML ~~LOC~~ SOLN: 60.0000 [IU] | Freq: Every day | SUBCUTANEOUS | Status: DC

## 2012-08-17 MED ORDER — INSULIN GLARGINE 100 UNIT/ML ~~LOC~~ SOLN: 60.0000 [IU] | Freq: Every day | SUBCUTANEOUS | Status: DC

## 2012-08-24 ENCOUNTER — Encounter (HOSPITAL_COMMUNITY): Payer: Self-pay

## 2012-08-24 ENCOUNTER — Emergency Department (INDEPENDENT_AMBULATORY_CARE_PROVIDER_SITE_OTHER)
Admission: EM | Admit: 2012-08-24 | Discharge: 2012-08-24 | Disposition: A | Discharge status: Home / Self Care | Payer: Self-pay | Source: Home / Self Care

## 2012-08-24 DIAGNOSIS — Z23 Encounter for immunization: Secondary | ICD-10-CM

## 2012-08-24 DIAGNOSIS — E119 Type 2 diabetes mellitus without complications: Secondary | ICD-10-CM

## 2012-08-24 MED ORDER — INSULIN GLARGINE 100 UNIT/ML ~~LOC~~ SOLN: 60.0000 [IU] | Freq: Every day | SUBCUTANEOUS | Status: DC

## 2012-08-24 MED ORDER — INFLUENZA VIRUS VACC SPLIT PF IM SUSP
0.5000 mL | Freq: Once | INTRAMUSCULAR | Status: AC
  Administered 2012-08-24: 0.5 mL via INTRAMUSCULAR

## 2012-08-24 MED ORDER — INSULIN ASPART 100 UNIT/ML ~~LOC~~ SOLN: 10.0000 [IU] | Freq: Three times a day (TID) | SUBCUTANEOUS | Status: DC

## 2012-08-24 NOTE — Progress Notes (Signed)
Patient Demographics  Anita James, is a 61 y.o. female  [id number]  [id number]  DOB - 06-18-1951  Chief Complaint  Patient presents with  . Medication Refill     also requesting flu shot   Subjective:   Anita James today has, No headache, [name], [name], [name], [name] She is requesting prescriptions for NovoLog and Lantus pens (instead of the bottles), also wants a flu shot  Objective:    Filed Vitals:   08/24/12 1757  BP: 134/66  Pulse: 92  Temp: 98.6 F (37 C)  TempSrc: Oral  Resp: 20  SpO2: 97%     Exam  Awake Alert, Oriented X 3, nonfocal, Normal affect [place].AT,PERRAL Supple Neck,No JVD, No cervical lymphadenopathy appriciated.  Symmetrical Chest wall movement, Good air movement bilaterally, CTAB RRR,No Gallops,Rubs or new Murmurs, No Parasternal Heave +ve B.Sounds, Abd Soft, Non tender, No organomegaly appriciated, No rebound - guarding or rigidity. No Cyanosis, Clubbing or edema, no rash    Data Review   CBC No results found for this basename: WBC:5,HGB:5,HCT:5,PLT:5,MCV:5,MCH:5,MCHC:5,RDW:5,NEUTRABS:5,LYMPHSABS:5,MONOABS:5,EOSABS:5,BASOSABS:5,BANDABS:5,BANDSABD:5 in the last 168 hours  Chemistries   No results found for this basename: NA:5,K:5,CL:5,CO2:5,GLUCOSE:5,BUN:5,CREATININE:5,GFRCGP,:5,CALCIUM:5,MG:5,AST:5,ALT:5,ALKPHOS:5,BILITOT:5 in the last 168 hours ------------------------------------------------------------------------------------------------------------------ No results found for this basename: HGBA1C:2 in the last 72 hours ------------------------------------------------------------------------------------------------------------------ No results found for this basename: CHOL:2,HDL:2,LDLCALC:2,TRIG:2,CHOLHDL:2,LDLDIRECT:2 in the last 72 hours ------------------------------------------------------------------------------------------------------------------ No  results found for this basename: TSH,T4TOTAL,FREET3,T3FREE,THYROIDAB in the last 72 hours ------------------------------------------------------------------------------------------------------------------ No results found for this basename: VITAMINB12:2,FOLATE:2,FERRITIN:2,TIBC:2,IRON:2,RETICCTPCT:2 in the last 72 hours  Coagulation profile  No results found for this basename: INR:5,PROTIME:5 in the last 168 hours     Prior to Admission medications   Medication Sig Start Date End Date Taking? Authorizing Provider  amLODipine (NORVASC) 10 MG tablet Take 1 tablet (10 mg total) by mouth daily. 08/11/12   Leroy Sea, MD  fenofibrate (TRICOR) 145 MG tablet Take 1 tablet (145 mg total) by mouth daily. 08/11/12   Leroy Sea, MD  furosemide (LASIX) 40 MG tablet Take 1 tablet (40 mg total) by mouth daily. 08/11/12   Leroy Sea, MD  gabapentin (NEURONTIN) 300 MG capsule Take 1 capsule (300 mg total) by mouth 4 (four) times daily. 08/11/12   Leroy Sea, MD  ibuprofen (ADVIL,MOTRIN) 200 MG tablet Take 200 mg by mouth every 6 (six) hours as needed. For pain     Historical Provider, MD  insulin aspart (NOVOLOG FLEXPEN) 100 UNIT/ML injection Inject 10-15 Units into the skin 3 (three) times daily before meals. 08/24/12   Kela Millin, MD  insulin glargine (LANTUS) 100 UNIT/ML injection Inject 60 Units into the skin at bedtime. 08/24/12   Kela Millin, MD  levothyroxine (SYNTHROID, LEVOTHROID) 100 MCG tablet Take 1 tablet (100 mcg total) by mouth daily. 08/11/12   Leroy Sea, MD  lisinopril (PRINIVIL,ZESTRIL) 40 MG tablet Take 1 tablet (40 mg total) by mouth daily. 08/11/12   Leroy Sea, MD  metFORMIN (GLUCOPHAGE) 1000 MG tablet Take 1 tablet (1,000 mg total) by mouth 2 (two) times daily with a meal. 08/11/12   Leroy Sea, MD  metoprolol (LOPRESSOR) 50 MG tablet Take 1 tablet (50 mg total) by mouth 2 (two) times daily. 08/11/12   Leroy Sea, MD  Multiple  Vitamins-Minerals (EYE VITAMINS) CAPS 1 pill Po daily, 1 month supply 08/11/12   Leroy Sea, MD  pantoprazole (PROTONIX) 40 MG tablet Take 1 tablet (40 mg total)  by mouth 2 (two) times daily. 08/11/12   Leroy Sea, MD  potassium chloride (K-DUR) 10 MEQ tablet Take 1 tablet (10 mEq total) by mouth 2 (two) times daily. 08/11/12   Leroy Sea, MD  simvastatin (ZOCOR) 20 MG tablet Take 1 tablet (20 mg total) by mouth at bedtime. 08/11/12   Leroy Sea, MD     Assessment & Plan   Diabetes mellitus -She is here for refill of her Lantus, because she states her last prescription was for the bottle instead of the pen and she cannot use the bottle. Nursing staff to instruct patient on how to draw up her insulin and administer- states she'll use this for tonight but wants the prescription for the Lantus and NovoLog pens and these have been written Influenza vaccine -Requesting the flu shot, will be administered.     Follow-up Information    Please follow up. (As previously directed per November visit)           Kela Millin M.D on 08/24/2012 at 6:40 PM

## 2012-08-24 NOTE — ED Notes (Signed)
Patient was seen here on 08/11/12.  Here because she needs insulin administration teaching as well as has questions on her novolog  Dosage.

## 2012-08-30 ENCOUNTER — Ambulatory Visit: Payer: Self-pay | Admitting: Family Medicine

## 2012-08-30 ENCOUNTER — Encounter: Payer: Self-pay | Admitting: Family Medicine

## 2012-08-30 DIAGNOSIS — E1140 Type 2 diabetes mellitus with diabetic neuropathy, unspecified: Secondary | ICD-10-CM

## 2012-08-30 DIAGNOSIS — E1165 Type 2 diabetes mellitus with hyperglycemia: Secondary | ICD-10-CM | POA: Insufficient documentation

## 2012-08-30 DIAGNOSIS — I10 Essential (primary) hypertension: Secondary | ICD-10-CM | POA: Insufficient documentation

## 2012-08-30 DIAGNOSIS — K219 Gastro-esophageal reflux disease without esophagitis: Secondary | ICD-10-CM | POA: Insufficient documentation

## 2012-08-30 DIAGNOSIS — E039 Hypothyroidism, unspecified: Secondary | ICD-10-CM | POA: Insufficient documentation

## 2012-08-30 NOTE — ED Notes (Signed)
Spoke with health department today in reference to patient  Faxed over sliding scale for her insulin.  Per Anita James at the health dept. Patient has not yet picked up her prescription for her insulin

## 2012-08-31 ENCOUNTER — Telehealth (HOSPITAL_COMMUNITY): Payer: Self-pay

## 2012-08-31 ENCOUNTER — Telehealth (HOSPITAL_COMMUNITY): Payer: Self-pay | Admitting: Family Medicine

## 2012-08-31 NOTE — Progress Notes (Signed)
I still haven't received a callback from the pharmacist at the [facility] regarding the patient's insulin prescriptions.  I called but no answer.    Rodney Langton, MD, CDE, FAAFP [facility] [place], [place]

## 2012-08-31 NOTE — ED Notes (Signed)
I received a phone call at home from the pharmacy tech at the health department regarding the novolog flexpen prescription that I wrote for the patient yesterday.  [facility] was [facility] yesterday for the 2 other providers that have seen the patient here in clinic, most recently 12/4.  The pharmacy tech told me that the pharmacist is not comfortable filling the novolog flexpen prescription because the sig says : "take 20 units [facility] or as directed by MD"     She says that they feel that the patient is noncompliant and they don't want her to get the impression that she can take insulin without a doctor's orders.  She says that they need a specific sliding scale order.  I explained to the pharmacy tech that it is customary for the insulin doses to change rapidly when we are trying to get control of someone who has poorly controlled diabetes mellitus and that is why I worded the sig. instructions as I have done many times in the past for many patients.    I explained to her that I am concerned that the patient is glucose toxic and she has been denied her insulin for at least a week or longer.  I explained to her that the patient was not prescribed a sliding scale and that she had not been using a sliding scale for several years according to what she told us when we spoke with her on the phone yesterday.  I asked to speak with the pharmacist but she says that the original pharmacist who denied the medication to her is not working today but she would have me to speak with another pharmacist.  Then she tells me that the pharmacist has stepped out to go to lunch but will call me back later on my cellular telephone.    I was present with Arvin Collard, LPN and Charlett Lango yesterday evening around 7:10pm when they called the patient and verified with her the insulin doses that she was actually taking.  I had specifically asked them to do so because I have never seen the patient in clinic.  She was  seen by 2 other providers.  The patient said that she had not used a sliding scale in over 3 years and it was prescribed by Dr. Concepcion Elk years ago and that she has not been using a sliding scale since she had been going to the [facility] clinic.  The patient said that she was having blurry vision and she was advised to check her BS immediately and to seek emergency care if her BS was over 300.  I gave this same patient the same advise over the phone a couple of weeks ago and it is frustrating because we are still trying to get this patient her insulin medications 2 weeks later.  It is impossible to get her diabetes under control if the patient  does not have access to her insulin medications.  I am now waiting for the pharmacist to call me so that I can speak with them about what concerns they have about getting this patient her medication today.    Rodney Langton, MD, CDE, FAAFP [facility] [place], [place]    Cleora Fleet, MD 08/31/12 1340

## 2012-08-31 NOTE — Telephone Encounter (Signed)
Message copied by Lestine Mount on Wed Aug 31, 2012  4:30 PM ------      Message from: Cleora Fleet      Created: Wed Aug 31, 2012  1:52 PM      Regarding: Insulin very important       At this time, I still haven't received a callback from the pharmacist at the health department about this patient's insulin medications.  I tried to explain to the pharmacy tech on the phone that I believe that this patient is glucose toxic and needs her insulin right now.  I am very frustrated that the patient has been denied her insulin medication for over 2 weeks now.  The tech told me that they believed that the patient is noncompliant with taking her insulin but I told them that was not a reason to deny her lifesaving medication.             I told them that she was new to this [facility] and I was not in a position to label this patient noncompliant without ever having seen her but after we spoke with her on the phone and she said that she was having blurred vision we directed her to check her blood sugar immediately and go to the ER if her BS was over 300.              Please document the conversations you have had with the patient over these past couple of weeks in the EMR.   Please have the patient to come in to the clinic this week to check her blood sugars as she is having symptoms of glucose toxicity and may need more aggressive treatment.  Encourage patient to check her blood sugars 5-6 times per day and write down numbers and bring to office visit this week.             CPhilippa Chester

## 2012-08-31 NOTE — Progress Notes (Signed)
The pharmacist from the health department called me and I was able to verify her prescribed dose of Novolog 20 units TIDAC.  I explained to her that the insulin doses may change as adjustments are made to her insulin regimen.  She said that they had called the patient on Friday of last week to notify her that her insulin was ready to pick up and the patient never came to pick up the medication.   The pharmacist said that they would call her again today and ask her to come and pick up her medication.   Rodney Langton, MD, CDE, FAAFP [facility] [place], [place]

## 2012-08-31 NOTE — Telephone Encounter (Signed)
Message copied by Lestine Mount on Wed Aug 31, 2012  4:47 PM ------      Message from: Cleora Fleet      Created: Wed Aug 31, 2012  4:22 PM      Regarding: Insulin Medication       I finally got in touch with the pharmacist and she said that the patient was told last Friday that she could come and pick up her insulin medication but she never came to pick it up.  I clarified the dose of novolog to be 20 units TID AC or as directed by MD.  The pharmacy said that they would call patient to come and pick up her medication today.  Please call and tell patient to go and pick up her insulin and start taking her medications as prescribed.                   Rodney Langton, MD, CDE, FAAFP      [facility]      [facility]      [place], [place]

## 2012-08-31 NOTE — ED Notes (Signed)
Please see progress note written on this matter dated 08/31/12.    I spoke with the pharmacist.  She said she would have pt to pick up her medication today.  She said that they had told patient last week that she could come and pick up her medication but she never came to pick up.    Rodney Langton, MD, CDE, FAAFP [facility] [place], [place]    Cleora Fleet, MD 08/31/12 [phone]

## 2012-09-08 ENCOUNTER — Other Ambulatory Visit (HOSPITAL_COMMUNITY): Payer: Self-pay | Admitting: Family Medicine

## 2012-09-08 MED ORDER — LISINOPRIL 40 MG PO TABS: 40.0000 mg | ORAL_TABLET | Freq: Every day | ORAL | Status: DC

## 2012-10-04 DIAGNOSIS — E785 Hyperlipidemia, unspecified: Secondary | ICD-10-CM | POA: Insufficient documentation

## 2012-10-04 DIAGNOSIS — I10 Essential (primary) hypertension: Secondary | ICD-10-CM | POA: Insufficient documentation

## 2012-10-04 DIAGNOSIS — Z794 Long term (current) use of insulin: Secondary | ICD-10-CM | POA: Insufficient documentation

## 2012-10-04 DIAGNOSIS — IMO0001 Reserved for inherently not codable concepts without codable children: Secondary | ICD-10-CM | POA: Insufficient documentation

## 2012-10-04 DIAGNOSIS — I509 Heart failure, unspecified: Secondary | ICD-10-CM | POA: Insufficient documentation

## 2012-10-04 DIAGNOSIS — E039 Hypothyroidism, unspecified: Secondary | ICD-10-CM | POA: Insufficient documentation

## 2012-10-04 DIAGNOSIS — Z79899 Other long term (current) drug therapy: Secondary | ICD-10-CM | POA: Insufficient documentation

## 2012-10-04 DIAGNOSIS — F411 Generalized anxiety disorder: Secondary | ICD-10-CM | POA: Insufficient documentation

## 2012-10-04 DIAGNOSIS — I5032 Chronic diastolic (congestive) heart failure: Secondary | ICD-10-CM | POA: Insufficient documentation

## 2012-10-04 DIAGNOSIS — K449 Diaphragmatic hernia without obstruction or gangrene: Secondary | ICD-10-CM | POA: Insufficient documentation

## 2012-10-04 DIAGNOSIS — Z7902 Long term (current) use of antithrombotics/antiplatelets: Secondary | ICD-10-CM | POA: Insufficient documentation

## 2012-10-04 DIAGNOSIS — R079 Chest pain, unspecified: Principal | ICD-10-CM | POA: Insufficient documentation

## 2012-10-04 NOTE — ED Notes (Signed)
Presents with left sided chest pain that began this am before noon, the pain began while sleeping and became worse afternoon. Pain is left sided chest described as soreness and constant radiates into left neck. Pain is worse with movement in the neck and chest. C/o left arm numbness and tingling, SOB. She states, "I had a sharp pain in my shoulder blade on the way here and is comes and goes"

## 2012-10-05 ENCOUNTER — Encounter (HOSPITAL_COMMUNITY): Payer: Self-pay | Admitting: Adult Health

## 2012-10-05 ENCOUNTER — Observation Stay (HOSPITAL_COMMUNITY)
Admission: EM | Admit: 2012-10-05 | Discharge: 2012-10-07 | Disposition: A | Discharge status: Home / Self Care | Payer: Self-pay | Attending: Internal Medicine | Admitting: Internal Medicine

## 2012-10-05 ENCOUNTER — Emergency Department (HOSPITAL_COMMUNITY): Payer: Self-pay

## 2012-10-05 DIAGNOSIS — R079 Chest pain, unspecified: Secondary | ICD-10-CM

## 2012-10-05 DIAGNOSIS — K219 Gastro-esophageal reflux disease without esophagitis: Secondary | ICD-10-CM

## 2012-10-05 DIAGNOSIS — E1165 Type 2 diabetes mellitus with hyperglycemia: Secondary | ICD-10-CM

## 2012-10-05 DIAGNOSIS — R072 Precordial pain: Secondary | ICD-10-CM

## 2012-10-05 DIAGNOSIS — E785 Hyperlipidemia, unspecified: Secondary | ICD-10-CM

## 2012-10-05 DIAGNOSIS — I5032 Chronic diastolic (congestive) heart failure: Secondary | ICD-10-CM

## 2012-10-05 HISTORY — DX: Polyneuropathy, unspecified: G62.9

## 2012-10-05 HISTORY — DX: Pneumonia, unspecified organism: J18.9

## 2012-10-05 HISTORY — DX: Anxiety disorder, unspecified: F41.9

## 2012-10-05 LAB — TROPONIN I
Troponin I: <0.3 ng/mL (ref <0.30)
Troponin I: <0.3 ng/mL (ref <0.30)

## 2012-10-05 LAB — CBC
HCT: 37.1 % (ref 36.0–46.0)
Hemoglobin: 11.5 g/dL — ABNORMAL LOW (ref 12.0–15.0)
Hemoglobin: 13.1 g/dL (ref 12.0–15.0)
MCH: 30 pg (ref 26.0–34.0)
MCHC: 34.4 g/dL (ref 30.0–36.0)
MCHC: 35.3 g/dL (ref 30.0–36.0)
RBC: 4.27 MIL/uL (ref 3.87–5.11)
RDW: 12.8 % (ref 11.5–15.5)

## 2012-10-05 LAB — GLUCOSE, CAPILLARY
Glucose-Capillary: 163 mg/dL — ABNORMAL HIGH (ref 70–99)
Glucose-Capillary: 180 mg/dL — ABNORMAL HIGH (ref 70–99)
Glucose-Capillary: 77 mg/dL (ref 70–99)

## 2012-10-05 LAB — BASIC METABOLIC PANEL
CO2: 26 mEq/L (ref 19–32)
GFR calc non Af Amer: >90 mL/min (ref >90)
Glucose, Bld: 75 mg/dL (ref 70–99)
Potassium: 3.6 mEq/L (ref 3.5–5.1)
Sodium: 140 mEq/L (ref 135–145)

## 2012-10-05 LAB — CREATININE, SERUM: Creatinine, Ser: 0.75 mg/dL (ref 0.50–1.10)

## 2012-10-05 LAB — HEMOGLOBIN A1C: Mean Plasma Glucose: 154 mg/dL — ABNORMAL HIGH (ref <117)

## 2012-10-05 LAB — POCT I-STAT TROPONIN I

## 2012-10-05 MED ORDER — ACETAMINOPHEN 325 MG PO TABS
650.0000 mg | ORAL_TABLET | Freq: Once | ORAL | Status: AC
  Administered 2012-10-05: 650 mg via ORAL
  Filled 2012-10-05: qty 2

## 2012-10-05 MED ORDER — ONDANSETRON HCL 4 MG/2ML IJ SOLN
4.0000 mg | Freq: Four times a day (QID) | INTRAMUSCULAR | Status: DC | PRN
  Administered 2012-10-05 – 2012-10-07 (×5): 4 mg via INTRAVENOUS
  Filled 2012-10-05 (×4): qty 2

## 2012-10-05 MED ORDER — METFORMIN HCL 500 MG PO TABS
1000.0000 mg | ORAL_TABLET | Freq: Two times a day (BID) | ORAL | Status: DC
  Administered 2012-10-05 – 2012-10-07 (×5): 1000 mg via ORAL
  Filled 2012-10-05 (×7): qty 2

## 2012-10-05 MED ORDER — NITROGLYCERIN 0.4 MG SL SUBL
0.4000 mg | SUBLINGUAL_TABLET | SUBLINGUAL | Status: DC | PRN
  Administered 2012-10-05 (×2): 0.4 mg via SUBLINGUAL
  Filled 2012-10-05: qty 25

## 2012-10-05 MED ORDER — METOPROLOL TARTRATE 50 MG PO TABS
50.0000 mg | ORAL_TABLET | Freq: Two times a day (BID) | ORAL | Status: DC
  Administered 2012-10-05: 50 mg via ORAL
  Filled 2012-10-05 (×2): qty 1

## 2012-10-05 MED ORDER — ENOXAPARIN SODIUM 40 MG/0.4ML ~~LOC~~ SOLN
40.0000 mg | SUBCUTANEOUS | Status: DC
  Administered 2012-10-05: 40 mg via SUBCUTANEOUS
  Filled 2012-10-05 (×2): qty 0.4

## 2012-10-05 MED ORDER — HYDROMORPHONE HCL PF 1 MG/ML IJ SOLN
1.0000 mg | INTRAMUSCULAR | Status: DC | PRN
  Administered 2012-10-05 – 2012-10-07 (×9): 1 mg via INTRAVENOUS
  Filled 2012-10-05 (×9): qty 1

## 2012-10-05 MED ORDER — INSULIN ASPART 100 UNIT/ML ~~LOC~~ SOLN
0.0000 [IU] | Freq: Three times a day (TID) | SUBCUTANEOUS | Status: DC
  Administered 2012-10-05 – 2012-10-07 (×5): 3 [IU] via SUBCUTANEOUS
  Administered 2012-10-07: 2 [IU] via SUBCUTANEOUS

## 2012-10-05 MED ORDER — NITROGLYCERIN 2 % TD OINT
1.0000 [in_us] | TOPICAL_OINTMENT | Freq: Four times a day (QID) | TRANSDERMAL | Status: DC
  Administered 2012-10-05 – 2012-10-07 (×7): 1 [in_us] via TOPICAL
  Filled 2012-10-05: qty 30

## 2012-10-05 MED ORDER — MORPHINE SULFATE 2 MG/ML IJ SOLN
2.0000 mg | Freq: Once | INTRAMUSCULAR | Status: AC
  Administered 2012-10-05: 2 mg via INTRAVENOUS
  Filled 2012-10-05: qty 1

## 2012-10-05 MED ORDER — NITROGLYCERIN 2 % TD OINT
0.5000 [in_us] | TOPICAL_OINTMENT | Freq: Four times a day (QID) | TRANSDERMAL | Status: DC
  Administered 2012-10-05 (×3): 0.5 [in_us] via TOPICAL
  Filled 2012-10-05 (×2): qty 30

## 2012-10-05 MED ORDER — GABAPENTIN 300 MG PO CAPS
300.0000 mg | ORAL_CAPSULE | Freq: Four times a day (QID) | ORAL | Status: DC
  Administered 2012-10-05 – 2012-10-07 (×11): 300 mg via ORAL
  Filled 2012-10-05 (×12): qty 1

## 2012-10-05 MED ORDER — LISINOPRIL 40 MG PO TABS
40.0000 mg | ORAL_TABLET | Freq: Every day | ORAL | Status: DC
  Administered 2012-10-05 – 2012-10-07 (×3): 40 mg via ORAL
  Filled 2012-10-05 (×3): qty 1

## 2012-10-05 MED ORDER — AMLODIPINE BESYLATE 10 MG PO TABS
10.0000 mg | ORAL_TABLET | Freq: Every day | ORAL | Status: DC
  Administered 2012-10-05 – 2012-10-07 (×3): 10 mg via ORAL
  Filled 2012-10-05 (×3): qty 1

## 2012-10-05 MED ORDER — MORPHINE SULFATE 2 MG/ML IJ SOLN: 2.0000 mg | INTRAMUSCULAR | Status: DC | PRN

## 2012-10-05 MED ORDER — ASPIRIN 81 MG PO CHEW
81.0000 mg | CHEWABLE_TABLET | Freq: Once | ORAL | Status: AC
  Administered 2012-10-05: 81 mg via ORAL
  Filled 2012-10-05: qty 1

## 2012-10-05 MED ORDER — ASPIRIN EC 81 MG PO TBEC
81.0000 mg | DELAYED_RELEASE_TABLET | Freq: Every day | ORAL | Status: DC
  Administered 2012-10-05 – 2012-10-07 (×3): 81 mg via ORAL
  Filled 2012-10-05 (×4): qty 1

## 2012-10-05 MED ORDER — FENOFIBRATE 54 MG PO TABS
54.0000 mg | ORAL_TABLET | Freq: Every day | ORAL | Status: DC
  Administered 2012-10-05 – 2012-10-07 (×3): 54 mg via ORAL
  Filled 2012-10-05 (×3): qty 1

## 2012-10-05 MED ORDER — METOPROLOL TARTRATE 50 MG PO TABS
50.0000 mg | ORAL_TABLET | Freq: Two times a day (BID) | ORAL | Status: DC
  Administered 2012-10-05 – 2012-10-07 (×5): 50 mg via ORAL
  Filled 2012-10-05 (×5): qty 1

## 2012-10-05 MED ORDER — SIMVASTATIN 20 MG PO TABS
20.0000 mg | ORAL_TABLET | Freq: Every day | ORAL | Status: DC
  Administered 2012-10-05 – 2012-10-06 (×2): 20 mg via ORAL
  Filled 2012-10-05 (×3): qty 1

## 2012-10-05 MED ORDER — FUROSEMIDE 40 MG PO TABS
40.0000 mg | ORAL_TABLET | Freq: Every day | ORAL | Status: DC
  Administered 2012-10-05 – 2012-10-07 (×3): 40 mg via ORAL
  Filled 2012-10-05 (×3): qty 1

## 2012-10-05 MED ORDER — PANTOPRAZOLE SODIUM 40 MG PO TBEC
40.0000 mg | DELAYED_RELEASE_TABLET | Freq: Two times a day (BID) | ORAL | Status: DC
  Administered 2012-10-05 – 2012-10-07 (×6): 40 mg via ORAL
  Filled 2012-10-05 (×5): qty 1

## 2012-10-05 MED ORDER — SODIUM CHLORIDE 0.9 % IV SOLN: 250.0000 mL | INTRAVENOUS | Status: DC | PRN

## 2012-10-05 MED ORDER — SODIUM CHLORIDE 0.9 % IJ SOLN
3.0000 mL | Freq: Two times a day (BID) | INTRAMUSCULAR | Status: DC
  Administered 2012-10-06 – 2012-10-07 (×3): 3 mL via INTRAVENOUS

## 2012-10-05 MED ORDER — ENOXAPARIN SODIUM 40 MG/0.4ML ~~LOC~~ SOLN
40.0000 mg | SUBCUTANEOUS | Status: DC
  Administered 2012-10-06 – 2012-10-07 (×2): 40 mg via SUBCUTANEOUS
  Filled 2012-10-05 (×4): qty 0.4

## 2012-10-05 MED ORDER — INSULIN GLARGINE 100 UNIT/ML ~~LOC~~ SOLN
60.0000 [IU] | Freq: Every day | SUBCUTANEOUS | Status: DC
  Administered 2012-10-05 – 2012-10-06 (×2): 60 [IU] via SUBCUTANEOUS

## 2012-10-05 MED ORDER — POTASSIUM CHLORIDE ER 10 MEQ PO TBCR
10.0000 meq | EXTENDED_RELEASE_TABLET | Freq: Two times a day (BID) | ORAL | Status: DC
  Administered 2012-10-05 – 2012-10-07 (×6): 10 meq via ORAL
  Filled 2012-10-05 (×6): qty 1

## 2012-10-05 MED ORDER — LEVOTHYROXINE SODIUM 100 MCG PO TABS
100.0000 ug | ORAL_TABLET | Freq: Every day | ORAL | Status: DC
  Administered 2012-10-05 – 2012-10-07 (×3): 100 ug via ORAL
  Filled 2012-10-05 (×4): qty 1

## 2012-10-05 MED ORDER — ACETAMINOPHEN 650 MG RE SUPP: 650.0000 mg | Freq: Four times a day (QID) | RECTAL | Status: DC | PRN

## 2012-10-05 MED ORDER — ACETAMINOPHEN 325 MG PO TABS
650.0000 mg | ORAL_TABLET | Freq: Four times a day (QID) | ORAL | Status: DC | PRN
  Administered 2012-10-06: 650 mg via ORAL
  Filled 2012-10-05: qty 2

## 2012-10-05 MED ORDER — HYDROCODONE-ACETAMINOPHEN 5-325 MG PO TABS
1.0000 | ORAL_TABLET | ORAL | Status: DC | PRN
  Administered 2012-10-05: 2 via ORAL
  Filled 2012-10-05 (×3): qty 2

## 2012-10-05 MED ORDER — SODIUM CHLORIDE 0.9 % IJ SOLN: 3.0000 mL | INTRAMUSCULAR | Status: DC | PRN

## 2012-10-05 MED ORDER — SODIUM CHLORIDE 0.9 % IJ SOLN
3.0000 mL | Freq: Two times a day (BID) | INTRAMUSCULAR | Status: DC
  Administered 2012-10-05 – 2012-10-07 (×2): 3 mL via INTRAVENOUS

## 2012-10-05 NOTE — Progress Notes (Signed)
Patient c/o chest pain = # 10 to left side. Denied radiation to any other place.  Skin warm and dry.  Vomited in toilet just prior to chest pain.  Has Nitro paste 0.5 inches on.  12-Lead EKG obtained and showed NSR.  No ST elevation.  B/P 103/58, Pulse 73, R 20.  O2 sat 94%.  Dr. Thedore Mins informed via text page.  Orders given for Zofran prn and to increase Nitropaste to 1 inch.

## 2012-10-05 NOTE — H&P (Signed)
[facility]                                                                                    Patient Demographics  Anita James, is a 62 y.o. female  CSN: [id number]  MRN: [id number]  DOB - 01/08/1951  Admit Date - 10/05/2012  Outpatient Primary MD for the patient is Pcp Not In System   With History of -  Past Medical History  Diagnosis Date  . Diabetes mellitus type II, uncontrolled   . Hypertension   . Hypothyroidism   . Hiatal hernia   . Hyperlipemia   . Diverticulitis       Past Surgical History  Procedure Date  . Cesarean section   . Knee surgery   . Thyroid surgery     in for   Chief Complaint  Patient presents with  . Chest Pain     HPI  Anita James  is a 62 y.o. female, with past medical history significant for diabetes mellitus, hyperlipidemia, and hypertension presenting today with a chest pain episode that woke her up from sleep. Patient denies anycold sweats, nausea but reports shortness of breath. The chest pain was on and off for the whole day sometimes lasting as long as 30 minutes. The chest pain is a retrosternal sometimes goes under her left breast and radiates to the left arm with tingling. Her left Jaw was numb. No history of orthopnea PND or history of leg swelling. Patient denies any history of stress test or cardiac catheterization done lately.   Review of Systems    In addition to the HPI above,  No Fever-chills, No Headache, No changes with Vision or hearing, No problems swallowing food or Liquids, No Abdominal pain, No Nausea or Vommitting, Bowel movements are regular, No Blood in stool or Urine, No dysuria, No new skin rashes or bruises, No new joints pains-aches,  No new weakness, tingling, numbness in any extremity, No recent weight gain or loss, No polyuria, polydypsia or polyphagia, No significant Mental Stressors.  A full 10 point Review of Systems was done, except as stated above, all other Review of  Systems were negative.   Social History History  Substance Use Topics  . Smoking status: Never Smoker   . Smokeless tobacco: Not on file  . Alcohol Use: No     Family History History reviewed. No pertinent family history.   Prior to Admission medications   Medication Sig Start Date End Date Taking? Authorizing Provider  amLODipine (NORVASC) 10 MG tablet Take 1 tablet (10 mg total) by mouth daily. 08/11/12  Yes Leroy Sea, MD  fenofibrate (TRICOR) 145 MG tablet Take 1 tablet (145 mg total) by mouth daily. 08/11/12  Yes Leroy Sea, MD  furosemide (LASIX) 40 MG tablet Take 1 tablet (40 mg total) by mouth daily. 08/11/12  Yes Leroy Sea, MD  gabapentin (NEURONTIN) 300 MG capsule Take 1 capsule (300 mg total) by mouth 4 (four) times daily. 08/11/12  Yes Leroy Sea, MD  ibuprofen (ADVIL,MOTRIN) 200 MG tablet Take 200 mg by mouth every 6 (six) hours as needed. For pain    Yes Historical Provider,  MD  insulin aspart (NOVOLOG) 100 UNIT/ML injection Inject 20 Units into the skin 3 (three) times daily before meals. 08/24/12  Yes Adeline C Viyuoh, MD  insulin glargine (LANTUS) 100 UNIT/ML injection Inject 60 Units into the skin at bedtime. 08/24/12  Yes Adeline C Viyuoh, MD  levothyroxine (SYNTHROID, LEVOTHROID) 100 MCG tablet Take 1 tablet (100 mcg total) by mouth daily. 08/11/12  Yes Leroy Sea, MD  lisinopril (PRINIVIL,ZESTRIL) 40 MG tablet Take 1 tablet (40 mg total) by mouth daily. 09/08/12  Yes Cl[place] Cyndie Mull, MD  metFORMIN (GLUCOPHAGE) 1000 MG tablet Take 1 tablet (1,000 mg total) by mouth 2 (two) times daily with a meal. 08/11/12  Yes Leroy Sea, MD  metoprolol (LOPRESSOR) 50 MG tablet Take 1 tablet (50 mg total) by mouth 2 (two) times daily. 08/11/12  Yes Leroy Sea, MD  Multiple Vitamins-Minerals (EYE VITAMINS) CAPS 1 pill Po daily, 1 month supply 08/11/12  Yes Leroy Sea, MD  pantoprazole (PROTONIX) 40 MG tablet Take 1 tablet (40 mg  total) by mouth 2 (two) times daily. 08/11/12  Yes Leroy Sea, MD  potassium chloride (K-DUR) 10 MEQ tablet Take 1 tablet (10 mEq total) by mouth 2 (two) times daily. 08/11/12  Yes Leroy Sea, MD  simvastatin (ZOCOR) 20 MG tablet Take 1 tablet (20 mg total) by mouth at bedtime. 08/11/12  Yes Leroy Sea, MD    Allergies  Allergen Reactions  . Sulfa Antibiotics Shortness Of Breath  . Amoxicillin-Pot Clavulanate     Combination of medications tear lining of stomach    Physical Exam  Vitals  Blood pressure 150/66, pulse 94, temperature 98.1 F (36.7 C), temperature source Oral, resp. rate 14, SpO2 96.00%.   1.  Gen.; Middle-aged white American female in no acute distress  2. Normal affect and insight, Not Suicidal or Homicidal, Awake Alert, Oriented X 3.  3. No F.N deficits, ALL C.Nerves Intact, Strength 5/5 all 4 extremities, Sensation intact all 4 extremities, Plantars down going.  4. Ears and Eyes appear Normal, Conjunctivae clear, PERRLA. Moist Oral Mucosa.  5. Supple Neck, No JVD, No cervical lymphadenopathy appriciated, No Carotid Bruits.  6. Symmetrical Chest wall movement, Good air movement bilaterally, CTAB.  7. RRR, No Gallops, Rubs or Murmurs, No Parasternal Heave.  8. Positive Bowel Sounds, Abdomen Soft, obese, Non tender, No organomegaly appriciated,No rebound -guarding or rigidity.  9.  No Cyanosis, Normal Skin Turgor, No Skin Rash or Bruise.  10. Good muscle tone,  joints appear normal , no effusions, Normal ROM.  11. No Palpable Lymph Nodes in Neck or Axillae    Data Review  CBC  Lab 10/05/12 0004  WBC 7.5  HGB 13.1  HCT 37.1  PLT 158  MCV 86.9  MCH 30.7  MCHC 35.3  RDW 12.8  LYMPHSABS --  MONOABS --  EOSABS --  BASOSABS --  BANDABS --   ------------------------------------------------------------------------------------------------------------------  Chemistries   Lab 10/05/12 0004  NA 140  K 3.6  CL 102  CO2 26    GLUCOSE 75  BUN 17  CREATININE 0.72  CALCIUM 10.0  MG --  AST --  ALT --  ALKPHOS --  BILITOT --   ------------------------------------------------------------------------------------------------------------------ -------------------------------------------------------------------------------------------------------------------  Anita James 10/05/12 0101  DDIMER <0.27   -------------------------------------------------------------------------------------------------------------------    ---------------------------------------------------------------------------------------------------------------      Imaging results:   Dg Chest 2 View  10/05/2012  *RADIOLOGY REPORT*  Clinical Data: Shortness of breath, chest pain.  CHEST - 2 VIEW  Comparison:  07/24/2011  Findings: Heart size upper limits normal.  Lungs clear.  No effusion.  Regional bones unremarkable.  IMPRESSION:  1.  No acute disease   Original Report Authenticated By: D. Andria Rhein, MD     My personal review of EKG: Normal sinus rhythm with LVH. No significant EKG changes,    Assessment & Plan  1. chest pain, troponins are negative, and EKG is negative for acute events.  2. History of hypertension, hyperlipidemia, diabetes mellitus and hypothyroidism.  Plan 1. Admit the patient to telemetry under observation. 2. Rule out MI 3. Consult cardiology in a.m. for possible stress test or cardiac cath as outpatient.    DVT Prophylaxis Lovenox  AM Labs Ordered, also please review Full Orders  Family Communication: Admission, patients condition and plan of care including tests being ordered have been discussed with the patient  who indicate understanding and agree with the plan and Code Status.  Code Status full  Disposition Plan: Admit to telemetry for probable stress test and discharge home when stable  Time spent in minutes : 35 minutes  Condition fair

## 2012-10-05 NOTE — Progress Notes (Signed)
Assigned Anita James as patient @ [phone].  NTG paste due @ 12 noon - med not in med drawer - notification sent to pharmacy for missing med.  Introduced myself to patient and my remaining shift, until 7:30pm tonight.  No complaints @ present.

## 2012-10-05 NOTE — Plan of Care (Signed)
Problem: Phase II Progression Outcomes Goal: Stress Test if indicated Outcome: Progressing Scheduled for 10/06/13

## 2012-10-05 NOTE — Plan of Care (Signed)
Problem: Phase I Progression Outcomes Goal: MD aware of Cardiac Marker results Outcome: Completed/Met Date Met:  10/05/12 Negative x 2

## 2012-10-05 NOTE — ED Provider Notes (Signed)
History     CSN: [id number]  Arrival date & time 10/04/12  2343   First MD Initiated Contact with Patient 10/05/12 0029      Chief Complaint  Patient presents with  . Chest Pain    (Consider location/radiation/quality/duration/timing/severity/associated sxs/prior treatment) Patient is a 62 y.o. female presenting with chest pain.  Chest Pain The chest pain began yesterday. Duration of episode(s) is 3 minutes. Chest pain occurs intermittently. The chest pain is unchanged. At its most intense, the pain is at 7/10. The pain is currently at 2/10. The severity of the pain is moderate. The quality of the pain is described as aching. The pain radiates to the left jaw, left neck and left arm. She tried nothing for the symptoms.  Her past medical history is significant for diabetes and hypertension.  Procedure history is negative for cardiac catheterization.     Past Medical History  Diagnosis Date  . Diabetes mellitus type II, uncontrolled   . Hypertension   . Hypothyroidism   . Hiatal hernia   . Hyperlipemia   . Diverticulitis     Past Surgical History  Procedure Date  . Cesarean section   . Knee surgery   . Thyroid surgery     History reviewed. No pertinent family history.  History  Substance Use Topics  . Smoking status: Never Smoker   . Smokeless tobacco: Not on file  . Alcohol Use: No    OB History    Grav Para Term Preterm Abortions TAB SAB Ect Mult Living   4 4 3 1      6       Review of Systems  Cardiovascular: Positive for chest pain.  All other systems reviewed and are negative.    Allergies  Sulfa antibiotics and Amoxicillin-pot clavulanate  Home Medications   Current Outpatient Rx  Name  Route  Sig  Dispense  Refill  . AMLODIPINE BESYLATE 10 MG PO TABS   Oral   Take 1 tablet (10 mg total) by mouth daily.   30 tablet   2   . FENOFIBRATE 145 MG PO TABS   Oral   Take 1 tablet (145 mg total) by mouth daily.   30 tablet   2   . FUROSEMIDE  40 MG PO TABS   Oral   Take 1 tablet (40 mg total) by mouth daily.   30 tablet   2   . GABAPENTIN 300 MG PO CAPS   Oral   Take 1 capsule (300 mg total) by mouth 4 (four) times daily.   120 capsule   2   . IBUPROFEN 200 MG PO TABS   Oral   Take 200 mg by mouth every 6 (six) hours as needed. For pain          . INSULIN ASPART 100 UNIT/ML [place] SOLN   Subcutaneous   Inject 10-15 Units into the skin 3 (three) times daily before meals.   1 vial   12     OR AS PER SLIDING SCALE   . INSULIN GLARGINE 100 UNIT/ML [place] SOLN   Subcutaneous   Inject 60 Units into the skin at bedtime.   10 mL   3   . LEVOTHYROXINE SODIUM 100 MCG PO TABS   Oral   Take 1 tablet (100 mcg total) by mouth daily.   30 tablet   2   . LISINOPRIL 40 MG PO TABS   Oral   Take 1 tablet (40 mg total) by mouth  daily.   30 tablet   3   . METFORMIN HCL 1000 MG PO TABS   Oral   Take 1 tablet (1,000 mg total) by mouth 2 (two) times daily with a meal.   60 tablet   2   . METOPROLOL TARTRATE 50 MG PO TABS   Oral   Take 1 tablet (50 mg total) by mouth 2 (two) times daily.   60 tablet   2   . EYE VITAMINS PO CAPS      1 pill Po daily, 1 month supply   30 capsule   2   . PANTOPRAZOLE SODIUM 40 MG PO TBEC   Oral   Take 1 tablet (40 mg total) by mouth 2 (two) times daily.   60 tablet   2   . POTASSIUM CHLORIDE ER 10 MEQ PO TBCR   Oral   Take 1 tablet (10 mEq total) by mouth 2 (two) times daily.   30 tablet   2   . SIMVASTATIN 20 MG PO TABS   Oral   Take 1 tablet (20 mg total) by mouth at bedtime.   30 tablet   2     BP 138/74  Pulse 98  Temp 98.1 F (36.7 C) (Oral)  Resp 16  SpO2 94%  Physical Exam  Constitutional: She is oriented to person, place, and time. She appears well-developed and well-nourished.  HENT:  Head: Normocephalic and atraumatic.  Eyes: Conjunctivae normal and EOM are normal. Pupils are equal, round, and reactive to light.  Neck: Normal range of motion.    Cardiovascular: Normal rate, regular rhythm and normal heart sounds.   Pulmonary/Chest: Effort normal and breath sounds normal.  Abdominal: Soft. Bowel sounds are normal.  Musculoskeletal: Normal range of motion.  Neurological: She is alert and oriented to person, place, and time.  Skin: Skin is warm and dry.  Psychiatric: She has a normal mood and affect. Her behavior is normal.    ED Course  Procedures (including critical care time)   Labs Reviewed  CBC  POCT I-STAT TROPONIN I  BASIC METABOLIC PANEL   Dg Chest 2 View  10/05/2012  *RADIOLOGY REPORT*  Clinical Data: Shortness of breath, chest pain.  CHEST - 2 VIEW  Comparison: 07/24/2011  Findings: Heart size upper limits normal.  Lungs clear.  No effusion.  Regional bones unremarkable.  IMPRESSION:  1.  No acute disease   Original Report Authenticated By: D. Andria Rhein, MD      No diagnosis found.   Date: 10/05/2012  Rate: 95  Rhythm: normal sinus rhythm  QRS Axis: normal  Intervals: normal  ST/T Wave abnormalities: normal  Conduction Disutrbances: none  Narrative Interpretation: unremarkable     MDM  + chest pain,  Diabetes,  Htn. No prior cath/stress per pt.  Will admit for observation        Rosanne Ashing, MD 10/05/12 [phone]

## 2012-10-05 NOTE — Consult Note (Signed)
CARDIOLOGY CONSULT NOTE  [name], MRN: [id number], DOB/AGE: 04-10-1951 62 y.o. Admit date: 10/05/2012 Date of Consult: 10/05/2012  Primary Physician: None Primary Cardiologist: None  Chief Complaint: chest pain Reason for Consultation: chest pain  HPI: 62 y.o. female w/ PMHx significant for HTN, HLD, DMII, Obesity, Hypothyroidism, Hiatal Hernia, and Anxiety who presented to [facility] on 10/05/2012 with complaints of chest pain.  No prior cardiac history. No stress test or cardiac cath. Echo 02/2012 showed mild LVH, EF 55-60%, grade 1 diastolic dysfunction, PASP , no significant valvular abnls. Patient is a difficult historian as she did not seem to want to answer any questions, mostly answered I don't know or didn't answer at all. From what I could gather she woke up yesterday with numbness in left hand and sharp shooting pains into her left shoulder/neck and through left chest/breast. The sharp pain lasted ~1.5hrs, with residual dullness and return of sharp pain last night prompting her to present to the ED. Associated mild shortness of breath. No nausea, diaphoresis, or dizziness. Worse with deep inspiration or neck movement. Denies recent illness, fever, chills, cough, extended travel or prolonged immobilization. She is occasionally active and able to walk and do stationary bike without chest pain.   EKG demonstrates NSR 77bpm, poor R wave progress, no acute ST/T changes, unchanged from prior EKG. CXR is without acute cardiopulmonary findings. Labs are significant for normal DDimer, normal troponin x2,  unremarkable CBC/BMET.  Past Medical History  Diagnosis Date  . Diabetes mellitus type II, [name]      02/2012 - Echo Study Conclusions: - Left ventricle: The cavity size was normal. Wall thickness was increased in a  pattern of mild LVH. Systolic function was normal. The estimated ejection fraction was in the range of 55% to 60%. Wall motion was normal; there were no regional wall motion abnormalities. Doppler parameters are consistent with abnormal left ventricular relaxation (grade 1 diastolic dysfunction). - Atrial septum: No defect or patent foramen ovale was identified. - Pulmonary arteries: PA peak pressure: 32mm Hg (S).   Surgical History:  Past Surgical History  Procedure Date  . Cesarean section   . Knee surgery   . Thyroid surgery      Home Meds: Medication Sig  amLODipine (NORVASC) 10 MG tablet Take 1 tablet (10 mg total) by mouth daily.  fenofibrate (TRICOR) 145 MG tablet Take 1 tablet (145 mg total) by mouth daily.  furosemide (LASIX) 40 MG tablet Take 1 tablet (40 mg total) by mouth daily.  gabapentin (NEURONTIN) 300 MG capsule Take 1 capsule (300 mg total) by mouth 4 (four) times daily.  ibuprofen (ADVIL,MOTRIN) 200 MG tablet Take 200 mg by mouth every 6 (six) hours as needed. For pain   insulin aspart (NOVOLOG) 100 UNIT/ML injection Inject 20 Units into the skin 3 (three) times daily before meals.  insulin glargine (LANTUS) 100 UNIT/ML injection Inject 60 Units into the skin at bedtime.  levothyroxine (SYNTHROID, LEVOTHROID) 100 MCG tablet Take 1 tablet (100 mcg total) by mouth daily.  lisinopril (PRINIVIL,ZESTRIL) 40 MG tablet Take 1 tablet (40 mg total) by mouth daily.  metFORMIN (GLUCOPHAGE) 1000 MG tablet Take 1 tablet (1,000 mg total) by mouth 2 (two) times daily with a meal.  metoprolol (LOPRESSOR) 50 MG tablet Take 1 tablet (50  mg total) by mouth 2 (two) times daily.  Multiple Vitamins-Minerals (EYE VITAMINS) CAPS 1 pill Po daily, 1 month supply  pantoprazole (PROTONIX) 40 MG tablet Take 1 tablet (40 mg total) by mouth 2 (two) times daily.  potassium chloride (K-DUR) 10 MEQ tablet Take 1 tablet (10 mEq total) by mouth 2 (two) times daily.  simvastatin (ZOCOR) 20 MG tablet Take  1 tablet (20 mg total) by mouth at bedtime.    Inpatient Medications:   . amLODipine  10 mg Oral Daily  . aspirin EC  81 mg Oral Daily  . enoxaparin (LOVENOX) injection  40 mg Subcutaneous Q24H  . enoxaparin (LOVENOX) injection  40 mg Subcutaneous Q24H  . fenofibrate  54 mg Oral Daily  . furosemide  40 mg Oral Daily  . gabapentin  300 mg Oral QID  . insulin aspart  0-15 Units Subcutaneous TID WC  . insulin glargine  60 Units Subcutaneous QHS  . levothyroxine  100 mcg Oral QAC breakfast  . lisinopril  40 mg Oral Daily  . metFORMIN  1,000 mg Oral BID WC  . metoprolol  50 mg Oral BID  . nitroGLYCERIN  0.5 inch Topical Q6H  . pantoprazole  40 mg Oral BID  . potassium chloride  10 mEq Oral BID  . simvastatin  20 mg Oral QHS  . sodium chloride  3 mL Intravenous Q12H      Allergies:  Allergies  Allergen Reactions  . Sulfa Antibiotics Shortness Of Breath  . Amoxicillin-Pot Clavulanate     Combination of medications tear lining of stomach    History   Social History  . Marital Status: Divorced    Spouse Name: N/A    Number of Children: N/A  . Years of Education: N/A   Occupational History  . Not on file.   Social History Main Topics  . Smoking status: Never Smoker   . Smokeless tobacco: Never Used  . Alcohol Use: No  . Drug Use: No  . Sexually Active:    Other Topics Concern  . Not on file   Social History Narrative  . No narrative on file     History reviewed. No pertinent family history.   Review of Systems: General: negative for chills, fever, night sweats or weight changes.  Cardiovascular: As per HPI Dermatological: negative for rash Respiratory: negative for cough or wheezing Urologic: negative for hematuria Abdominal: negative for nausea, vomiting, diarrhea, bright red blood per rectum, melena, or hematemesis Neurologic: negative for visual changes, syncope, or dizziness All other systems reviewed and are otherwise negative except as noted  above.  Labs:    10/05/2012 00:18  Troponin i, poc 0.00   Basename 10/05/12 0700  TROPONINI <0.30   Component Value Date   WBC 5.4 10/05/2012   HGB 11.5* 10/05/2012   HCT 33.4* 10/05/2012   MCV 87.2 10/05/2012   PLT 211 10/05/2012    Lab 10/05/12 0700 10/05/12 0004  NA -- 140  K -- 3.6  CL -- 102  CO2 -- 26  BUN -- 17  CREATININE 0.75 --  CALCIUM -- 10.0  PROT -- --  BILITOT -- --  ALKPHOS -- --  ALT -- --  AST -- --  GLUCOSE -- 75   Component Value Date   DDIMER <0.27 10/05/2012    Radiology/Studies:   10/05/2012 - CHEST - 2 VIEW  Findings: Heart size upper limits normal.  Lungs clear.  No effusion.  Regional bones unremarkable.  IMPRESSION:  1.  No  acute disease       EKG: 10/05/12 0427 - NSR 77bpm, poor R wave progress, no acute ST/T changes, unchanged from prior EKG  Physical Exam: Blood pressure 153/73, pulse 81, temperature 98 F (36.7 C), temperature source Oral, resp. rate 18, height 5\' 6"  (1.676 m), weight 256 lb 2.8 oz (116.2 kg), SpO2 96.00%. General: Overweight white female in no acute distress. Head: Normocephalic, atraumatic, sclera non-icteric, no xanthomas, nares are without discharge.  Neck: Supple. Negative for carotid bruits or JVD  Lungs: Clear bilaterally to auscultation without wheezes, rales, or rhonchi. Breathing is unlabored. Heart: RRR with S1 S2. Soft systolic murmur LUSB. No rubs, or gallops appreciated. Abdomen: Obese, soft, non-tender, non-distended with normoactive bowel sounds. . No rebound/guarding. No obvious abdominal masses. Msk:  Strength and tone appear normal for age. Extremities: No clubbing or cyanosis. No edema.  Distal pedal pulses are intact and equal bilaterally. Neuro: Alert and oriented X 3. Moves all extremities spontaneously. Psych:  Responds to questions appropriately with a flat affect.   Assessment and Plan:  62 y.o. female w/ PMHx significant for HTN, HLD, DMII, Hypothyroidism, Hiatal Hernia, and Anxiety who  presented to [facility] on 10/05/2012 with complaints of chest pain.  1. Precordial Pain 2. Hypertension 3. Hyperlipidemia 4. Diabetes Mellitus, Type 2, Uncontrolled, A1c 10.5 07/2012 5. Mild normocytic anemia 6. Hypothyroidism  Patients with precordial pain that is atypical for ACS. EKG is nonischemic and cardiac enzymes normal. DDimer normal. Sounds musculoskeletal in nature, but given cardiac risk factors will plan for two day nuclear stress test. Cont home meds. Check TSH.    Signed, HOPE, JESSICA PA-C 10/05/2012, 9:37 AM Patient interviewed and examined.  Her chest pain is atypical. EKG and cardiac enzymes are normal.  Physical examination is unremarkable.  She does have some left sided chest wall tenderness.  She does have good peripheral pulses despite her history that her legs ache when she walks.  She does have multiple risk factors for ischemic heart disease including poorly controlled diabetes.  We will proceed with a 2 day Myoview stress test.  She feels that she can walk adequately on the treadmill.  If she is unable to attain her target heart rate we can switch to [employer].  Agree with assessment and plan as noted above.  I suspect that her chest pain is of musculoskeletal origin.

## 2012-10-06 ENCOUNTER — Observation Stay (HOSPITAL_COMMUNITY): Payer: Self-pay

## 2012-10-06 DIAGNOSIS — R079 Chest pain, unspecified: Secondary | ICD-10-CM

## 2012-10-06 DIAGNOSIS — I5032 Chronic diastolic (congestive) heart failure: Secondary | ICD-10-CM

## 2012-10-06 DIAGNOSIS — I509 Heart failure, unspecified: Secondary | ICD-10-CM

## 2012-10-06 DIAGNOSIS — K219 Gastro-esophageal reflux disease without esophagitis: Secondary | ICD-10-CM

## 2012-10-06 DIAGNOSIS — IMO0001 Reserved for inherently not codable concepts without codable children: Secondary | ICD-10-CM

## 2012-10-06 LAB — GLUCOSE, CAPILLARY
Glucose-Capillary: 120 mg/dL — ABNORMAL HIGH (ref 70–99)
Glucose-Capillary: 114 mg/dL — ABNORMAL HIGH (ref 70–99)
Glucose-Capillary: 137 mg/dL — ABNORMAL HIGH (ref 70–99)

## 2012-10-06 MED ORDER — ONDANSETRON HCL 4 MG/2ML IJ SOLN
INTRAMUSCULAR | Status: AC
  Filled 2012-10-06: qty 2

## 2012-10-06 MED ORDER — DOCUSATE SODIUM 100 MG PO CAPS
100.0000 mg | ORAL_CAPSULE | Freq: Two times a day (BID) | ORAL | Status: DC
  Administered 2012-10-06 – 2012-10-07 (×3): 100 mg via ORAL
  Filled 2012-10-06 (×4): qty 1

## 2012-10-06 MED ORDER — REGADENOSON 0.4 MG/5ML IV SOLN
0.4000 mg | Freq: Once | INTRAVENOUS | Status: AC
  Administered 2012-10-06: 0.4 mg via INTRAVENOUS

## 2012-10-06 MED ORDER — NITROGLYCERIN 0.4 MG SL SUBL: 0.4000 mg | SUBLINGUAL_TABLET | SUBLINGUAL | Status: DC | PRN

## 2012-10-06 MED ORDER — DSS 100 MG PO CAPS: 100.0000 mg | ORAL_CAPSULE | Freq: Two times a day (BID) | ORAL | Status: DC

## 2012-10-06 MED ORDER — MAGNESIUM HYDROXIDE 400 MG/5ML PO SUSP
30.0000 mL | Freq: Every day | ORAL | Status: DC
  Administered 2012-10-06 – 2012-10-07 (×2): 30 mL via ORAL
  Filled 2012-10-06 (×2): qty 30

## 2012-10-06 MED ORDER — PANTOPRAZOLE SODIUM 40 MG PO TBEC: 40.0000 mg | DELAYED_RELEASE_TABLET | Freq: Two times a day (BID) | ORAL | Status: DC

## 2012-10-06 MED ORDER — ASPIRIN 81 MG PO TBEC: 81.0000 mg | DELAYED_RELEASE_TABLET | Freq: Every day | ORAL | Status: DC

## 2012-10-06 NOTE — Progress Notes (Signed)
Patient completed nuc stress. Attempted walking, but unable to reach target heart rate as she was given her beta blocker this morning. Converted to lexiscan. Results pending.   [place], PA-C 10/06/2012 10:35 AM  [phone] pgr

## 2012-10-06 NOTE — Progress Notes (Signed)
Pt stated they here nauseous, gave Zofran with continue to monitor

## 2012-10-06 NOTE — Discharge Summary (Signed)
[facility]                                                                                   Anita James, is a 62 y.o. female  DOB 07/25/1951  MRN [id number].  Admission date:  10/05/2012  Discharge Date:  10/07/2012  Primary MD  Pcp Not In System  Admitting Physician  Carron Curie, MD  Admission Diagnosis  Diabetes mellitus type II, uncontrolled [250.02] Diastolic CHF, chronic [428.32, 428.0] Chest pain [786.50] Chest pain  Discharge Diagnosis     Principal Problem:  *Chest pain      Past Medical History  Diagnosis Date  . Diabetes mellitus type II, [name]     Past Surgical History  Procedure Date  . Cesarean section   . Knee surgery   . Thyroid surgery        Discharge Diagnoses:   Principal Problem:  *Chest pain    Discharge Condition: Stable   Diet recommendation: See Discharge Instructions below   Consults Cardiology - Dr Patty Sermons   History of present illness and  Hospital Course:  See H&P, Labs, Consult and Test reports for all details in brief, patient was admitted for atypical noncardiac chest pain who ruled out for MI with serially negative troponins, underwent less he scan which was negative for reversible ischemia EF was 72%, her chest pain is likely coming from reflux, she will be placed on 3 times a day PPI and discharged home with outpatient followup with primary care physician and cardiologist. If symptoms persist outpatient GI input is recommended for EGD and further causes of substernal chest pain.    Patient has history of diabetes mellitus, hypertension and hypothyroidism her home medications will be continued, her A1c was 7 TSH was 1.5.    Today   Subjective:   Anita James today has no headache,no chest abdominal pain,no new weakness tingling or numbness, feels much better  wants to go home today.   Objective:   Blood pressure 112/63, pulse 84, temperature 97.8 F (36.6 C), temperature source Oral, resp. rate 20, height 5\' 6"  (1.676 m), weight 114.987 kg (253 lb 8 oz), SpO2 97.00%.   Intake/Output Summary (Last 24 hours) at 10/07/12 1506 Last data filed at 10/07/12 0837  Gross per 24 hour  Intake    878 ml  Output   3500 ml  Net  -2622 ml    Exam Awake Alert, Oriented *3, No new F.N deficits, Normal affect Reynolds.AT,PERRAL Supple Neck,No JVD, No cervical lymphadenopathy appriciated.  Symmetrical Chest wall movement, Good air movement bilaterally, CTAB RRR,No Gallops,Rubs or new Murmurs, No Parasternal Heave +ve B.Sounds, Abd Soft, Non tender, No organomegaly appriciated, No rebound -guarding or rigidity. No Cyanosis, Clubbing or edema, No new Rash or bruise  Data Review   Major procedures and Radiology Reports - PLEASE review detailed and final reports for all details in brief Eugenie Birks   Dg Chest 2 View  10/05/2012  *RADIOLOGY REPORT*  Clinical Data: Shortness of breath, chest pain.  CHEST - 2 VIEW  Comparison: 07/24/2011  Findings: Heart size upper limits normal.  Lungs clear.  No effusion.  Regional bones unremarkable.  IMPRESSION:  1.  No acute disease   Original Report Authenticated By: D. Andria Rhein, MD     Micro Results      No results found for this or any previous visit (from the past 240 hour(s)).   CBC w Diff: Lab Results  Component Value Date   WBC 5.4 10/05/2012   HGB 11.5* 10/05/2012   HCT 33.4* 10/05/2012   PLT 211 10/05/2012   LYMPHOPCT 33 07/24/2011   MONOPCT 11 07/24/2011   EOSPCT 1 07/24/2011   BASOPCT 0 07/24/2011    CMP: Lab Results  Component Value Date   NA 140 10/05/2012   K 3.6 10/05/2012   CL 102 10/05/2012   CO2 26 10/05/2012   BUN 17 10/05/2012   CREATININE 0.75 10/05/2012   PROT 7.2 12/30/2011   ALBUMIN 4.1 12/30/2011   BILITOT 0.3 12/30/2011   ALKPHOS 47 12/30/2011   AST 18 12/30/2011   ALT 21 12/30/2011   .   Discharge Instructions     Follow with Primary MD in 7 days   Get CBC, CMP, checked 7 days by Primary MD and again as instructed by your Primary MD.    Get Medicines reviewed and adjusted.  Please request your Prim.MD to go over all Hospital Tests and Procedure/Radiological results at the follow up, please get all Hospital records sent to your Prim MD by signing hospital release before you go home.  Activity: As tolerated with Full fall precautions use walker/cane & assistance as needed   Diet:  Heart Healthy-Low carb, Aspiration precautions.  For Heart failure patients - Check your Weight same time everyday, if you gain over 2 pounds, or you develop in leg swelling, experience more shortness of breath or chest pain, call your Primary MD immediately. Follow Cardiac Low Salt Diet and 1.8 lit/day fluid restriction.  Disposition Home    If you experience worsening of your admission symptoms, develop shortness of breath, life threatening emergency, suicidal or homicidal thoughts you must seek medical attention immediately by calling 911 or calling your MD immediately  if symptoms less severe.  You Must read complete instructions/literature along with all the possible adverse reactions/side effects for all the Medicines you take and that have been prescribed to you. Take any new Medicines after you have completely understood and accpet all the possible adverse reactions/side effects.   Do not drive and provide baby sitting services if your were admitted for syncope or siezures until you have seen by Primary MD or a Neurologist and advised to do so again.  Do not drive when taking Pain medications.    Do not take more than prescribed Pain, Sleep and Anxiety Medications  Special Instructions: If you have smoked or chewed Tobacco  in the last 2 yrs please stop smoking, stop any regular Alcohol  and or any Recreational drug use.  Wear Seat belts while driving.   Follow-up  Information    Follow up with Your Primary MD. Schedule an appointment as soon as possible for a visit in 1 week.      Follow up with Cassell Clement, MD. Schedule an appointment as soon as possible for a visit in 1 week.   Contact information:   [address] [place] [address] [phone]  Follow up with Stan Head, MD. Schedule an appointment as soon as possible for a visit in 1 week.   Contact information:   [address] [place] [address] [phone]            Discharge Medications     Medication List     As of 10/07/2012  3:06 PM    START taking these medications         DSS 100 MG Caps   Take 100 mg by mouth 2 (two) times daily.      nitroGLYCERIN 0.4 MG SL tablet   Commonly known as: NITROSTAT   Place 1 tablet (0.4 mg total) under the tongue every 5 (five) minutes as needed for chest pain.      CONTINUE taking these medications         amLODipine 10 MG tablet   Commonly known as: NORVASC   Take 1 tablet (10 mg total) by mouth daily.      Eye Vitamins Caps   1 pill Po daily, 1 month supply      fenofibrate 145 MG tablet   Commonly known as: TRICOR   Take 1 tablet (145 mg total) by mouth daily.      furosemide 40 MG tablet   Commonly known as: LASIX   Take 1 tablet (40 mg total) by mouth daily.      gabapentin 300 MG capsule   Commonly known as: NEURONTIN   Take 1 capsule (300 mg total) by mouth 4 (four) times daily.      insulin aspart 100 UNIT/ML injection   Commonly known as: novoLOG      insulin glargine 100 UNIT/ML injection   Commonly known as: LANTUS   Inject 60 Units into the skin at bedtime.      levothyroxine 100 MCG tablet   Commonly known as: SYNTHROID, LEVOTHROID   Take 1 tablet (100 mcg total) by mouth daily.      lisinopril 40 MG tablet   Commonly known as: PRINIVIL,ZESTRIL   Take 1 tablet (40 mg total) by mouth daily.      metFORMIN 1000 MG tablet   Commonly known  as: GLUCOPHAGE   Take 1 tablet (1,000 mg total) by mouth 2 (two) times daily with a meal.   Start taking on: 10/09/2012      metoprolol 50 MG tablet   Commonly known as: LOPRESSOR   Take 1 tablet (50 mg total) by mouth 2 (two) times daily.      pantoprazole 40 MG tablet   Commonly known as: PROTONIX   Take 1 tablet (40 mg total) by mouth 2 (two) times daily.      potassium chloride 10 MEQ tablet   Commonly known as: K-DUR   Take 1 tablet (10 mEq total) by mouth 2 (two) times daily.      simvastatin 20 MG tablet   Commonly known as: ZOCOR   Take 1 tablet (20 mg total) by mouth at bedtime.      STOP taking these medications         ibuprofen 200 MG tablet   Commonly known as: ADVIL,MOTRIN          Where to get your medications    These are the prescriptions that you need to pick up. We sent them to a specific pharmacy, so you will need to go there to get them.   [address] [place], [place] [address]    [address] [place] [address]    Phone: [phone]        DSS 100 MG Caps   nitroGLYCERIN 0.4 MG SL tablet   pantoprazole 40 MG tablet         You may get these medications from any pharmacy.         metFORMIN 1000 MG tablet               Total Time in preparing paper work, data evaluation and todays exam - 35 minutes  Leroy Sea M.D on 10/07/2012 at 3:06 PM  [facility] Office  [phone]

## 2012-10-06 NOTE — Progress Notes (Signed)
[facility]                                                                                Patient Demographics  Anita James, is a 62 y.o. female  [id number]  [id number]  DOB - 1951-02-06  Admit date - 10/05/2012  Admitting Physician Carron Curie, MD  Outpatient Primary MD for the patient is Pcp Not In System  LOS - 1   Chief Complaint  Patient presents with  . Chest Pain        Assessment & Plan    1.Atypical Chest pain, ruled out MI, stress pending per cards, pain free, Continue present medical rx.   2.DM-2- iss + lantus  CBG (last 3)   Basename 10/06/12 1635 10/06/12 1210 10/06/12 0618  GLUCAP 114* 168* 120*     Lab Results  Component Value Date   HGBA1C 7.0* 10/05/2012    3.HTN - home meds.   4.Hypothyroidism - home dose synthroid.  Lab Results  Component Value Date   TSH 1.533 10/06/2012     Consults  Cards   DVT Prophylaxis  Lovenox    Lab Results  Component Value Date   PLT 211 10/05/2012    Medications  Scheduled Meds:   . amLODipine  10 mg Oral Daily  . aspirin EC  81 mg Oral Daily  . docusate sodium  100 mg Oral BID  . enoxaparin (LOVENOX) injection  40 mg Subcutaneous Q24H  . fenofibrate  54 mg Oral Daily  . furosemide  40 mg Oral Daily  . gabapentin  300 mg Oral QID  . insulin aspart  0-15 Units Subcutaneous TID WC  . insulin glargine  60 Units Subcutaneous QHS  . levothyroxine  100 mcg Oral QAC breakfast  . lisinopril  40 mg Oral Daily  . magnesium hydroxide  30 mL Oral Daily  . metFORMIN  1,000 mg Oral BID WC  . metoprolol  50 mg Oral BID  . nitroGLYCERIN  1 inch Topical Q6H  . ondansetron      . pantoprazole  40 mg Oral BID  . potassium chloride  10 mEq Oral BID  . simvastatin  20 mg Oral QHS  . sodium chloride  3 mL Intravenous Q12H  . sodium chloride  3 mL Intravenous Q12H   Continuous Infusions:  PRN Meds:.sodium chloride, acetaminophen, acetaminophen, HYDROcodone-acetaminophen,  HYDROmorphone (DILAUDID) injection, nitroGLYCERIN, ondansetron (ZOFRAN) IV, sodium chloride  Antibiotics     Anti-infectives    None       Time Spent in minutes   30   Susa Raring K M.D on 10/06/2012 at 5:32 PM  Between 7am to 7pm - Pager - [phone]  After 7pm go to [email or web address] - password [email or web address]  And look for the night coverage person covering for me after hours  [facility] Office  [phone]    Subjective:   Elecia Serafin today has, No headache, [name], [name], [name], No Cough - SOB   Objective:   Filed Vitals:   10/06/12 1039 10/06/12 1041 10/06/12 1043 10/06/12 1341  BP: 118/56 145/59 134/68 116/65  Pulse:  66  Temp:    98 F (36.7 C)  TempSrc:    Oral  Resp:    18  Height:      Weight:      SpO2:    96%    Wt Readings from Last 3 Encounters:  10/06/12 110.7 kg (244 lb 0.8 oz)  12/30/11 117.935 kg (260 lb)  09/18/11 114.216 kg (251 lb 12.8 oz)     Intake/Output Summary (Last 24 hours) at 10/06/12 1732 Last data filed at 10/06/12 1712  Gross per 24 hour  Intake    220 ml  Output   3050 ml  Net  -2830 ml    Exam Awake Alert, Oriented X 3, No new F.N deficits, Normal affect [place].AT,PERRAL Supple Neck,No JVD, No cervical lymphadenopathy appriciated.  Symmetrical Chest wall movement, Good air movement bilaterally, CTAB RRR,No Gallops,Rubs or new Murmurs, No Parasternal Heave +ve B.Sounds, Abd Soft, Non tender, No organomegaly appriciated, No rebound - guarding or rigidity. No Cyanosis, Clubbing or edema, No new Rash or bruise      Data Review   Micro Results No results found for this or any previous visit (from the past 240 hour(s)).  Radiology Reports Dg Chest 2 View  10/05/2012  *RADIOLOGY REPORT*  Clinical Data: Shortness of breath, chest pain.  CHEST - 2 VIEW  Comparison: 07/24/2011  Findings: Heart size upper limits normal.  Lungs clear.  No effusion.   Regional bones unremarkable.  IMPRESSION:  1.  No acute disease   Original Report Authenticated By: D. Andria Rhein, MD     CBC  Lab 10/05/12 0700 10/05/12 0004  WBC 5.4 7.5  HGB 11.5* 13.1  HCT 33.4* 37.1  PLT 211 158  MCV 87.2 86.9  MCH 30.0 30.7  MCHC 34.4 35.3  RDW 12.8 12.8  LYMPHSABS -- --  MONOABS -- --  EOSABS -- --  BASOSABS -- --  BANDABS -- --    Chemistries   Lab 10/05/12 0700 10/05/12 0004  NA -- 140  K -- 3.6  CL -- 102  CO2 -- 26  GLUCOSE -- 75  BUN -- 17  CREATININE 0.75 0.72  CALCIUM -- 10.0  MG -- --  AST -- --  ALT -- --  ALKPHOS -- --  BILITOT -- --   ------------------------------------------------------------------------------------------------------------------ estimated creatinine clearance is 95.5 ml/min (by C-G formula based on Cr of 0.75). ------------------------------------------------------------------------------------------------------------------  Basename 10/05/12 0700  HGBA1C 7.0*   ------------------------------------------------------------------------------------------------------------------ No results found for this basename: CHOL:2,HDL:2,LDLCALC:2,TRIG:2,CHOLHDL:2,LDLDIRECT:2 in the last 72 hours ------------------------------------------------------------------------------------------------------------------  Basename 10/06/12 0630  TSH 1.533  T4TOTAL --  T3FREE --  THYROIDAB --   ------------------------------------------------------------------------------------------------------------------ No results found for this basename: VITAMINB12:2,FOLATE:2,FERRITIN:2,TIBC:2,IRON:2,RETICCTPCT:2 in the last 72 hours  Coagulation profile No results found for this basename: INR:5,PROTIME:5 in the last 168 hours   Basename 10/05/12 0101  DDIMER <0.27    Cardiac Enzymes  Lab 10/05/12 1530 10/05/12 0910 10/05/12 0700  CKMB -- -- --  TROPONINI <0.30 <0.30 <0.30  MYOGLOBIN -- -- --    ------------------------------------------------------------------------------------------------------------------ No components found with this basename: POCBNP:3

## 2012-10-06 NOTE — Progress Notes (Signed)
Subjective: Continues to have CP intermittently   Objective: Filed Vitals:   10/05/12 1809 10/05/12 2047 10/06/12 0155 10/06/12 0602  BP: 103/58 114/66 122/72 134/68  Pulse: 75 78 74 75  Temp:  98 F (36.7 C) 98.2 F (36.8 C) 98.1 F (36.7 C)  TempSrc:  Oral Oral Oral  Resp: 20 19 20 20   Height:      Weight:    244 lb 0.8 oz (110.7 kg)  SpO2: 95% 92% 94% 98%   Weight change: -12 lb 2 oz (-5.5 kg)  Intake/Output Summary (Last 24 hours) at 10/06/12 0850 Last data filed at 10/06/12 0655  Gross per 24 hour  Intake    780 ml  Output   3875 ml  Net  -3095 ml    General: Alert, awake, oriented x3, in no acute distress Neck:  JVP is normal Heart: Regular rate and rhythm, without murmurs, rubs, gallops.  Chest:  Tender to palpitation Lungs: Clear to auscultation.  No rales or wheezes. Exemities:  No edema.   Neuro: Grossly intact, nonfocal.  Tele:  SR  Lab Results: Results for orders placed during the hospital encounter of 10/05/12 (from the past 24 hour(s))  TROPONIN I     Status: Normal   Collection Time   10/05/12  9:10 AM      Component Value Range   Troponin I <0.30  <0.30 ng/mL  GLUCOSE, CAPILLARY     Status: Abnormal   Collection Time   10/05/12 11:34 AM      Component Value Range   Glucose-Capillary 114 (*) 70 - 99 mg/dL   Comment 1 Notify RN    TROPONIN I     Status: Normal   Collection Time   10/05/12  3:30 PM      Component Value Range   Troponin I <0.30  <0.30 ng/mL  GLUCOSE, CAPILLARY     Status: Abnormal   Collection Time   10/05/12  4:23 PM      Component Value Range   Glucose-Capillary 163 (*) 70 - 99 mg/dL   Comment 1 Notify RN    GLUCOSE, CAPILLARY     Status: Abnormal   Collection Time   10/05/12  8:46 PM      Component Value Range   Glucose-Capillary 121 (*) 70 - 99 mg/dL  GLUCOSE, CAPILLARY     Status: Abnormal   Collection Time   10/06/12  6:18 AM      Component Value Range   Glucose-Capillary 120 (*) 70 - 99 mg/dL   Comment 1 Notify RN        Studies/Results: @RISRSLT24 @  Medications  Reviewed   Patient Active Hospital Problem List: Chest pain (10/05/2012)   Assessment:Atypical  Patient to undergo myoview today. HTN  Good control   LOS: 1 day   Dietrich Pates 10/06/2012, 8:50 AM

## 2012-10-06 NOTE — Progress Notes (Signed)
Utilization Review Completed.   Swayze Pries, RN, BSN Nurse Case Manager  [phone]  

## 2012-10-06 NOTE — Progress Notes (Signed)
Pt stated they where SOB after brushing teeth. Pt was lying flat in bed relaxed unlabored breathing bp 108/60 stat 98% on 2L, no sign for distress. Evld HOB put Pt on 4L for comfort. Will continue to monitor

## 2012-10-07 ENCOUNTER — Encounter (HOSPITAL_COMMUNITY): Payer: No Typology Code available for payment source | Attending: Cardiology

## 2012-10-07 ENCOUNTER — Encounter (HOSPITAL_COMMUNITY): Payer: No Typology Code available for payment source

## 2012-10-07 DIAGNOSIS — R079 Chest pain, unspecified: Secondary | ICD-10-CM | POA: Insufficient documentation

## 2012-10-07 LAB — GLUCOSE, CAPILLARY: Glucose-Capillary: 123 mg/dL — ABNORMAL HIGH (ref 70–99)

## 2012-10-07 MED ORDER — GI COCKTAIL ~~LOC~~
30.0000 mL | Freq: Two times a day (BID) | ORAL | Status: DC
  Administered 2012-10-07: 30 mL via ORAL
  Filled 2012-10-07 (×2): qty 30

## 2012-10-07 MED ORDER — METFORMIN HCL 1000 MG PO TABS: 1000.0000 mg | ORAL_TABLET | Freq: Two times a day (BID) | ORAL | Status: DC

## 2012-10-07 MED ORDER — TECHNETIUM TC 99M SESTAMIBI GENERIC - CARDIOLITE
30.0000 | Freq: Once | INTRAVENOUS | Status: AC | PRN
  Administered 2012-10-07: 30 via INTRAVENOUS

## 2012-10-07 NOTE — Progress Notes (Signed)
[facility]                                                                                Patient Demographics  Anita James, is a 62 y.o. female  [id number]  [id number]  DOB - October 21, 1950  Admit date - 10/05/2012  Admitting Physician Carron Curie, MD  Outpatient Primary MD for the patient is Pcp Not In System  LOS - 2   Chief Complaint  Patient presents with  . Chest Pain        Assessment & Plan    1.Atypical Chest pain, ruled out MI, stress pending per cards, pain free, Continue present medical rx. Will add GI cocktail to rule out GI source.   2.DM-2- iss + lantus  CBG (last 3)   Basename 10/07/12 0606 10/06/12 2106 10/06/12 1635  GLUCAP 123* 137* 114*     Lab Results  Component Value Date   HGBA1C 7.0* 10/05/2012    3.HTN - home meds.   4.Hypothyroidism - home dose synthroid.  Lab Results  Component Value Date   TSH 1.533 10/06/2012     Consults  Cards   DVT Prophylaxis  Lovenox    Lab Results  Component Value Date   PLT 211 10/05/2012    Medications  Scheduled Meds:    . amLODipine  10 mg Oral Daily  . aspirin EC  81 mg Oral Daily  . docusate sodium  100 mg Oral BID  . enoxaparin (LOVENOX) injection  40 mg Subcutaneous Q24H  . fenofibrate  54 mg Oral Daily  . furosemide  40 mg Oral Daily  . gabapentin  300 mg Oral QID  . gi cocktail  30 mL Oral BID  . insulin aspart  0-15 Units Subcutaneous TID WC  . insulin glargine  60 Units Subcutaneous QHS  . levothyroxine  100 mcg Oral QAC breakfast  . lisinopril  40 mg Oral Daily  . magnesium hydroxide  30 mL Oral Daily  . metFORMIN  1,000 mg Oral BID WC  . metoprolol  50 mg Oral BID  . nitroGLYCERIN  1 inch Topical Q6H  . pantoprazole  40 mg Oral BID  . potassium chloride  10 mEq Oral BID  . simvastatin  20 mg Oral QHS  . sodium chloride  3 mL Intravenous Q12H  . sodium chloride  3 mL Intravenous Q12H   Continuous Infusions:  PRN Meds:.sodium chloride,  acetaminophen, acetaminophen, HYDROcodone-acetaminophen, HYDROmorphone (DILAUDID) injection, nitroGLYCERIN, ondansetron (ZOFRAN) IV, sodium chloride  Antibiotics     Anti-infectives    None       Time Spent in minutes   30   Susa Raring K M.D on 10/07/2012 at 10:34 AM  Between 7am to 7pm - Pager - [phone]  After 7pm go to [email or web address] - password [email or web address]  And look for the night coverage person covering for me after hours  [facility] Office  [phone]    Subjective:   Anita James today has, No headache, [name], [name], [name], No Cough - SOB   Objective:   Filed Vitals:   10/06/12 1341 10/06/12 2110 10/07/12 0608 10/07/12  0947  BP: 116/65 118/59 124/77 119/50  Pulse: 66 98 104 102  Temp: 98 F (36.7 C) 98.1 F (36.7 C) 98.6 F (37 C) 98.8 F (37.1 C)  TempSrc: Oral Oral Oral   Resp: 18 18 20 20   Height:      Weight:   114.987 kg (253 lb 8 oz)   SpO2: 96% 97% 92% 95%    Wt Readings from Last 3 Encounters:  10/07/12 114.987 kg (253 lb 8 oz)  12/30/11 117.935 kg (260 lb)  09/18/11 114.216 kg (251 lb 12.8 oz)     Intake/Output Summary (Last 24 hours) at 10/07/12 1034 Last data filed at 10/07/12 0837  Gross per 24 hour  Intake   1098 ml  Output   3950 ml  Net  -2852 ml    Exam Awake Alert, Oriented X 3, No new F.N deficits, Normal affect [place].AT,PERRAL Supple Neck,No JVD, No cervical lymphadenopathy appriciated.  Symmetrical Chest wall movement, Good air movement bilaterally, CTAB RRR,No Gallops,Rubs or new Murmurs, No Parasternal Heave +ve B.Sounds, Abd Soft, Non tender, No organomegaly appriciated, No rebound - guarding or rigidity. No Cyanosis, Clubbing or edema, No new Rash or bruise      Data Review   Micro Results No results found for this or any previous visit (from the past 240 hour(s)).  Radiology Reports Dg Chest 2 View  10/05/2012  *RADIOLOGY REPORT*   Clinical Data: Shortness of breath, chest pain.  CHEST - 2 VIEW  Comparison: 07/24/2011  Findings: Heart size upper limits normal.  Lungs clear.  No effusion.  Regional bones unremarkable.  IMPRESSION:  1.  No acute disease   Original Report Authenticated By: D. Andria Rhein, MD     CBC  Lab 10/05/12 0700 10/05/12 0004  WBC 5.4 7.5  HGB 11.5* 13.1  HCT 33.4* 37.1  PLT 211 158  MCV 87.2 86.9  MCH 30.0 30.7  MCHC 34.4 35.3  RDW 12.8 12.8  LYMPHSABS -- --  MONOABS -- --  EOSABS -- --  BASOSABS -- --  BANDABS -- --    Chemistries   Lab 10/05/12 0700 10/05/12 0004  NA -- 140  K -- 3.6  CL -- 102  CO2 -- 26  GLUCOSE -- 75  BUN -- 17  CREATININE 0.75 0.72  CALCIUM -- 10.0  MG -- --  AST -- --  ALT -- --  ALKPHOS -- --  BILITOT -- --   ------------------------------------------------------------------------------------------------------------------ estimated creatinine clearance is 97.5 ml/min (by C-G formula based on Cr of 0.75). ------------------------------------------------------------------------------------------------------------------  Basename 10/05/12 0700  HGBA1C 7.0*   ------------------------------------------------------------------------------------------------------------------ No results found for this basename: CHOL:2,HDL:2,LDLCALC:2,TRIG:2,CHOLHDL:2,LDLDIRECT:2 in the last 72 hours ------------------------------------------------------------------------------------------------------------------  Basename 10/06/12 0630  TSH 1.533  T4TOTAL --  T3FREE --  THYROIDAB --   ------------------------------------------------------------------------------------------------------------------ No results found for this basename: VITAMINB12:2,FOLATE:2,FERRITIN:2,TIBC:2,IRON:2,RETICCTPCT:2 in the last 72 hours  Coagulation profile No results found for this basename: INR:5,PROTIME:5 in the last 168 hours   Basename 10/05/12 0101  DDIMER <0.27    Cardiac  Enzymes  Lab 10/05/12 1530 10/05/12 0910 10/05/12 0700  CKMB -- -- --  TROPONINI <0.30 <0.30 <0.30  MYOGLOBIN -- -- --   ------------------------------------------------------------------------------------------------------------------ No components found with this basename: POCBNP:3

## 2012-10-07 NOTE — Progress Notes (Signed)
Patient sleeping.  To complete myoview today  No new recommendations.

## 2012-10-07 NOTE — Progress Notes (Signed)
Woke pt for dinner - asked to have it heated up later.

## 2012-10-07 NOTE — Progress Notes (Signed)
Call to [facility]. Pt cleared for discharge per Hurman Horn PA . Dr Tenny Craw had seen Pt this am.

## 2012-10-07 NOTE — Progress Notes (Signed)
Heated up pt's dinner. Went over all discharge instructions including home meds, follow up appts, prescription, and when to call MD. Pt states her daughter will come and take her home. Pt has her private vehicle here but prefers not to drive tonight.

## 2012-10-07 NOTE — Progress Notes (Signed)
Pt c/o CP 8 out of 1 to 10. Lt side of stomach , chest and up thru neck and down shoulder to arm. BP 119/50 HR ST 102- 02 sat 95 % on 2 L n/c 12 lead EKG done @ 1000 given 1 mg of Dilaudid for CP and c/o H/A

## 2012-10-07 NOTE — Progress Notes (Signed)
Informed pt that she is discharged per DR Thedore Mins. Pt cleared by cardiology to go. Her heart stress test was negative. Pt decided to go back to sleep and I told her we would go over her home meds, prescriptions and follow up appts at dinnertime.

## 2012-10-13 ENCOUNTER — Encounter: Payer: Self-pay | Admitting: Cardiology

## 2012-10-13 ENCOUNTER — Ambulatory Visit (INDEPENDENT_AMBULATORY_CARE_PROVIDER_SITE_OTHER): Payer: No Typology Code available for payment source | Admitting: Cardiology

## 2012-10-13 VITALS — BP 140/76 | HR 98 | Resp 18 | Ht 66.0 in | Wt 256.4 lb

## 2012-10-13 DIAGNOSIS — E1165 Type 2 diabetes mellitus with hyperglycemia: Secondary | ICD-10-CM

## 2012-10-13 DIAGNOSIS — I119 Hypertensive heart disease without heart failure: Secondary | ICD-10-CM

## 2012-10-13 DIAGNOSIS — R079 Chest pain, unspecified: Secondary | ICD-10-CM

## 2012-10-13 DIAGNOSIS — I10 Essential (primary) hypertension: Secondary | ICD-10-CM

## 2012-10-13 NOTE — Assessment & Plan Note (Signed)
The patient has not been having any hypoglycemic episodes.  She is discouraged about her weight.  She wants to lose weight.  We talked about a low carbohydrate low-calorie approach.

## 2012-10-13 NOTE — Assessment & Plan Note (Signed)
Blood pressure was remaining stable on current regimen.  She does have trace pedal edema from her amlodipine.

## 2012-10-13 NOTE — Patient Instructions (Addendum)
Your physician recommends that you continue on your current medications as directed. Please refer to the Current Medication list given to you today.  Your physician wants you to follow-up in: 6 month ov/ekg You will receive a reminder letter in the mail two months in advance. If you don't receive a letter, please call our office to schedule the follow-up appointment.   Try a soft cervical collar for your neck pain, if no better contact your Primary Care

## 2012-10-13 NOTE — Progress Notes (Signed)
Anita James Date of Birth:  [birth year] [facility] [address] [place], [place]  [address] [phone]         Fax   [phone]  History of Present Illness: This pleasant 62 year old woman is seen for a post hospital followup visit.  The patient was admitted to [facility] on 10/05/12 with left-sided chest pain as well as left-sided neck pain shoulder pain and left arm radiation she did not have any enzyme evidence for myocardial damage.  She does have multiple risk factors for ischemic heart disease including diabetes hypertension and hypercholesterolemia and sedentary lifestyle.  In the hospital she underwent a 2 day Lexa scan Myoview stress test which was completed on 10/08/11 and showed no evidence of ischemia and she had an ejection fraction of 72% with no wall motion abnormalities.  At discharge it was felt that some of her symptoms might be related to gastrointestinal factors and that if her symptoms persisted she should see a GI specialist.  Since discharge her pain has been improved.  She is still however having some discomfort extending up the posterior lateral aspect of the left and neck and she has some difficulty in turning her head to the left.  She has not been having any substernal chest pressure to suggest angina pectoris Her social history reveals that she has never smoked cigarettes.  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  She is not presently working.  She is divorced.  She has 6 children 4 of whom live in the [place] area.  [facility]  Current Outpatient Prescriptions  Medication Sig Dispense Refill  . amLODipine (NORVASC) 10 MG tablet Take 1 tablet (10 mg total) by mouth daily.  30 tablet  2  . docusate sodium 100 MG CAPS Take 100 mg by mouth 2 (two) times daily.  10 capsule  0  . fenofibrate (TRICOR) 145 MG tablet Take 1 tablet (145 mg total) by mouth  daily.  30 tablet  2  . furosemide (LASIX) 40 MG tablet Take 1 tablet (40 mg total) by mouth daily.  30 tablet  2  . gabapentin (NEURONTIN) 300 MG capsule Take 1 capsule (300 mg total) by mouth 4 (four) times daily.  120 capsule  2  . insulin aspart (NOVOLOG) 100 UNIT/ML injection Inject 20 Units into the skin 3 (three) times daily before meals.      . insulin glargine (LANTUS) 100 UNIT/ML injection Inject 60 Units into the skin at bedtime.  10 mL  3  . levothyroxine (SYNTHROID, LEVOTHROID) 100 MCG tablet Take 1 tablet (100 mcg total) by mouth daily.  30 tablet  2  . lisinopril (PRINIVIL,ZESTRIL) 40 MG tablet Take 1 tablet (40 mg total) by mouth daily.  30 tablet  3  . metFORMIN (GLUCOPHAGE) 1000 MG tablet Take 1 tablet (1,000 mg total) by mouth 2 (two) times daily with a meal.  60 tablet  2  . metoprolol (LOPRESSOR) 50 MG tablet Take 1 tablet (50 mg total) by mouth 2 (two) times daily.  60 tablet  2  . Multiple Vitamins-Minerals (EYE VITAMINS) CAPS 1 pill Po daily, 1 month supply  30 capsule  2  . nitroGLYCERIN (NITROSTAT) 0.4 MG SL tablet Place 1 tablet (0.4 mg total) under the tongue every 5 (five) minutes as needed for chest pain.  30 tablet  0  . pantoprazole (PROTONIX) 40 MG tablet  Take 1 tablet (40 mg total) by mouth 2 (two) times daily.  60 tablet  2  . potassium chloride (K-DUR) 10 MEQ tablet Take 1 tablet (10 mEq total) by mouth 2 (two) times daily.  30 tablet  2  . simvastatin (ZOCOR) 20 MG tablet Take 1 tablet (20 mg total) by mouth at bedtime.  30 tablet  2  . [DISCONTINUED] insulin detemir (LEVEMIR) 100 UNIT/ML injection Inject 60 Units into the skin at bedtime.  10 mL  1    Allergies  Allergen Reactions  . Sulfa Antibiotics Shortness Of Breath  . Amoxicillin-Pot Clavulanate     Combination of medications tear lining of stomach    Patient Active Problem List  Diagnosis  . Hypertension  . Hypothyroidism  . Hyperlipemia  . Diabetes mellitus type II, uncontrolled  . GERD  (gastroesophageal reflux disease)  . Diastolic CHF, chronic  . Chest pain    History  Smoking status  . Never Smoker   Smokeless tobacco  . Never Used    History  Alcohol Use No    No family history on file.  Review of Systems: Constitutional: no fever chills diaphoresis or fatigue or change in weight.  Head and neck: no hearing loss, no epistaxis, no photophobia or visual disturbance. Respiratory: No cough, shortness of breath or wheezing. Cardiovascular: [name] peripheral edema, palpitations. Gastrointestinal: No abdominal distention, no abdominal pain, no change in bowel habits hematochezia or melena. Genitourinary: No dysuria, no frequency, no urgency, no nocturia. Musculoskeletal:No arthralgias, no back pain, no gait disturbance or myalgias. Neurological: No dizziness, no headaches, no numbness, no seizures, no syncope, no weakness, no tremors. Hematologic: No lymphadenopathy, no easy bruising. Psychiatric: No confusion, no hallucinations, no sleep disturbance.    Physical Exam: Filed Vitals:   10/13/12 1447  BP: 140/76  Pulse: 98  Resp: 18   the general appearance reveals a well-developed well-nourished somewhat overweight woman in no distress.The head and neck exam reveals pupils equal and reactive.  Extraocular movements are full.  There is no scleral icterus.  The mouth and pharynx are normal.  The neck is supple.  The carotids reveal no bruits.  The jugular venous pressure is normal.  The  thyroid is not enlarged.  There is no lymphadenopathy.  The chest is clear to percussion and auscultation.  There are no rales or rhonchi.  Expansion of the chest is symmetrical.  The precordium is quiet.  The first heart sound is normal.  The second heart sound is physiologically split.  There is no murmur gallop rub or click.  There is no abnormal lift or heave.  The abdomen is soft and nontender.  The bowel sounds are normal.  The liver and spleen are not enlarged.  There  are no abdominal masses.  There are no abdominal bruits.  Extremities reveal good pedal pulses.  There is no phlebitis.  There is trace ankle edema.  There is no cyanosis or clubbing.  Strength is normal and symmetrical in all extremities.  There is no lateralizing weakness.  There are no sensory deficits.  The skin is warm and dry.  There is no rash.     Assessment / Plan: The patient is to continue her present medication.  Her left-sided neck pain is suggestive of possible cervical spondylitis and I suggested that she obtain a soft cervical collar and try this to see if she can get some symptomatic relief.  She will followup with her primary care physician.  She  will continue present cardiac and blood pressure medicines.  Recheck in 6 months for followup office visit and EKG.  Work harder at weight loss.

## 2012-10-13 NOTE — Assessment & Plan Note (Signed)
No further severe chest pain.  Recent nuclear stress test showed no evidence of ischemia and her ejection fraction was 72%

## 2012-10-20 ENCOUNTER — Emergency Department (HOSPITAL_COMMUNITY)
Admission: EM | Admit: 2012-10-20 | Discharge: 2012-10-20 | Disposition: A | Discharge status: Home / Self Care | Payer: No Typology Code available for payment source | Source: Home / Self Care | Attending: Family Medicine | Admitting: Family Medicine

## 2012-10-20 ENCOUNTER — Encounter (HOSPITAL_COMMUNITY): Payer: Self-pay

## 2012-10-20 DIAGNOSIS — I10 Essential (primary) hypertension: Secondary | ICD-10-CM

## 2012-10-20 DIAGNOSIS — K449 Diaphragmatic hernia without obstruction or gangrene: Secondary | ICD-10-CM

## 2012-10-20 DIAGNOSIS — I5032 Chronic diastolic (congestive) heart failure: Secondary | ICD-10-CM

## 2012-10-20 DIAGNOSIS — E785 Hyperlipidemia, unspecified: Secondary | ICD-10-CM

## 2012-10-20 DIAGNOSIS — K219 Gastro-esophageal reflux disease without esophagitis: Secondary | ICD-10-CM

## 2012-10-20 DIAGNOSIS — E039 Hypothyroidism, unspecified: Secondary | ICD-10-CM

## 2012-10-20 DIAGNOSIS — E1165 Type 2 diabetes mellitus with hyperglycemia: Secondary | ICD-10-CM

## 2012-10-20 MED ORDER — TRAMADOL HCL 50 MG PO TABS: 50.0000 mg | ORAL_TABLET | Freq: Three times a day (TID) | ORAL | Status: DC | PRN

## 2012-10-20 MED ORDER — CYCLOBENZAPRINE HCL 5 MG PO TABS: 5.0000 mg | ORAL_TABLET | Freq: Three times a day (TID) | ORAL | Status: DC | PRN

## 2012-10-20 NOTE — ED Notes (Signed)
Follow up- hospital discharge   Had a sharp pain that radiated down her  n eck into her chest which led her to go to the ED

## 2012-10-20 NOTE — ED Provider Notes (Signed)
History    CSN: [id number]  Arrival date & time 10/20/12  1521   First MD Initiated Contact with Patient 10/20/12 1552     Chief Complaint  [name]   HPI Pt presenting today to follow up from her recent hospitalization.  Pt has been admitted and discharged for having chest pain.  Pt was seen by cardiology and had a stress test and heart disease was thought unlikely.  Pt was told it was her acid reflux and her protonix was intensified.  Pt is now taking the protonix twice per day.  She is reporting that she is having persistent neck spasms and pain.  She was told she likely had a pinched nerve in her neck.  She says that she has trouble sleeping at night because of the pain.  Pt's blood sugars are much better controlled now.  Pt says that she is taking her meds and not having hypoglycemia.  No CP reported.  She followed up with cardiology after being discharged from hospital.  She was not allowed to see GI as recommended after discharge because she didn't have any medical insurance.    Past Medical History  Diagnosis Date  . Diabetes mellitus type II, [name]     Past Surgical History  Procedure Date  . Cesarean section   . Knee surgery   . Thyroid surgery     No family history on file.  History  Substance Use Topics  . Smoking status: Never Smoker   . Smokeless tobacco: Never Used  . Alcohol Use: No    OB History    Grav Para Term Preterm Abortions TAB SAB Ect Mult Living   4 4 3 1      6      Review of Systems  HENT: Positive for congestion and rhinorrhea.   Musculoskeletal:       Neck pain and spasm   Neurological: Positive for headaches.  All other systems reviewed and are negative.    Allergies  Sulfa antibiotics and Amoxicillin-pot clavulanate  Home Medications   Current Outpatient Rx  Name   Route  Sig  Dispense  Refill  . AMLODIPINE BESYLATE 10 MG PO TABS   Oral   Take 1 tablet (10 mg total) by mouth daily.   30 tablet   2   . DSS 100 MG PO CAPS   Oral   Take 100 mg by mouth 2 (two) times daily.   10 capsule   0   . FENOFIBRATE 145 MG PO TABS   Oral   Take 1 tablet (145 mg total) by mouth daily.   30 tablet   2   . FUROSEMIDE 40 MG PO TABS   Oral   Take 1 tablet (40 mg total) by mouth daily.   30 tablet   2   . GABAPENTIN 300 MG PO CAPS   Oral   Take 1 capsule (300 mg total) by mouth 4 (four) times daily.   120 capsule   2   . INSULIN ASPART 100 UNIT/ML [place] SOLN   Subcutaneous   Inject 20 Units into the skin 3 (three) times daily before meals.         . INSULIN GLARGINE 100 UNIT/ML [place] SOLN   Subcutaneous   Inject 60  Units into the skin at bedtime.   10 mL   3   . LEVOTHYROXINE SODIUM 100 MCG PO TABS   Oral   Take 1 tablet (100 mcg total) by mouth daily.   30 tablet   2   . LISINOPRIL 40 MG PO TABS   Oral   Take 1 tablet (40 mg total) by mouth daily.   30 tablet   3   . METFORMIN HCL 1000 MG PO TABS   Oral   Take 1 tablet (1,000 mg total) by mouth 2 (two) times daily with a meal.   60 tablet   2   . METOPROLOL TARTRATE 50 MG PO TABS   Oral   Take 1 tablet (50 mg total) by mouth 2 (two) times daily.   60 tablet   2   . EYE VITAMINS PO CAPS      1 pill Po daily, 1 month supply   30 capsule   2   . NITROGLYCERIN 0.4 MG SL SUBL   Sublingual   Place 1 tablet (0.4 mg total) under the tongue every 5 (five) minutes as needed for chest pain.   30 tablet   0   . PANTOPRAZOLE SODIUM 40 MG PO TBEC   Oral   Take 1 tablet (40 mg total) by mouth 2 (two) times daily.   60 tablet   2   . POTASSIUM CHLORIDE ER 10 MEQ PO TBCR   Oral   Take 1 tablet (10 mEq total) by mouth 2 (two) times daily.   30 tablet   2   . SIMVASTATIN 20 MG PO TABS   Oral   Take 1 tablet (20 mg total) by mouth at bedtime.   30 tablet   2     BP 119/65   Pulse 83  Temp 98.4 F (36.9 C) (Oral)  Resp 16  SpO2 100%  Physical Exam  Nursing note and vitals reviewed. Constitutional: She is oriented to person, place, and time. She appears well-developed and well-nourished. No distress.  HENT:  Head: Normocephalic and atraumatic.  Right Ear: External ear normal.  Left Ear: External ear normal.  Eyes: Conjunctivae normal and EOM are normal. Pupils are equal, round, and reactive to light.  Neck: Normal range of motion. Neck supple. No thyromegaly present.  Cardiovascular: Normal rate, regular rhythm and normal heart sounds.   Pulmonary/Chest: Effort normal and breath sounds normal.  Abdominal: Soft. Bowel sounds are normal. She exhibits no distension and no mass. There is no tenderness. There is no rebound and no guarding.  Musculoskeletal: Normal range of motion. She exhibits no edema and no tenderness.       Arms: Lymphadenopathy:    She has no cervical adenopathy.  Neurological: She is alert and oriented to person, place, and time. She has normal reflexes.  Skin: Skin is warm and dry.  Psychiatric: She has a normal mood and affect. Her behavior is normal. Judgment and thought content normal.    ED Course  Procedures (including critical care time)  Labs Reviewed - No data to display No results found.  No diagnosis found.  MDM  IMPRESSION  Hiatal Hernia  GERD - severe   Diabetes Mellitus, type 2   Neck spasms  RECOMMENDATIONS / PLAN Pt is having neck spasms on left side of neck Cyclobenzaprine 5 mg po every 12 hours prn  Tramadol 50 mg prn severe pain Continue current mgmt of diabetes as she is improving daily,  Hypoglycemia precautions advised  Continue aggressive PPI therapy Will try to refer to GI but pt has no insurance and none of the local providers are willing to see patients who can't afford to pay large amounts of cash upfront.   FOLLOW UP 2 months   The patient was given clear instructions to go to ER or  return to medical center if symptoms don't improve, worsen or new problems develop.  The patient verbalized understanding.  The patient was told to call to get lab results if they haven't heard anything in the next week.            Cleora Fleet, MD 10/20/12 [phone]

## 2012-10-21 ENCOUNTER — Other Ambulatory Visit: Payer: Self-pay | Admitting: Obstetrics

## 2012-10-21 DIAGNOSIS — Z1231 Encounter for screening mammogram for malignant neoplasm of breast: Secondary | ICD-10-CM

## 2012-10-21 NOTE — ED Notes (Signed)
appt made at [facility] center for 10/24/2012 @ 12pm. Patient to arrive at 11:45 left message for patient to return our call to make her aware

## 2012-10-21 NOTE — ED Notes (Signed)
Message left on machine with date and time of her appt. @ breast center

## 2012-10-24 ENCOUNTER — Ambulatory Visit
Admission: RE | Admit: 2012-10-24 | Discharge: 2012-10-24 | Disposition: A | Discharge status: Home / Self Care | Payer: Self-pay | Source: Ambulatory Visit | Attending: Obstetrics | Admitting: Obstetrics

## 2012-10-24 DIAGNOSIS — Z1231 Encounter for screening mammogram for malignant neoplasm of breast: Secondary | ICD-10-CM

## 2012-10-24 NOTE — ED Notes (Signed)
Patient had a scheduled appt @ the breast center - when asked for proper ID she refused and left the appt.

## 2012-10-28 MED ORDER — CHOLINE FENOFIBRATE 135 MG PO CPDR: 135.0000 mg | DELAYED_RELEASE_CAPSULE | Freq: Every day | ORAL | Status: DC

## 2012-10-28 NOTE — ED Notes (Signed)
Patient has appt [facility] 12/26/12 @ 1pm they will mail a package message was left for patient

## 2012-11-11 MED ORDER — METOPROLOL SUCCINATE ER 50 MG PO TB24: 50.0000 mg | ORAL_TABLET | Freq: Every day | ORAL | Status: DC

## 2012-12-14 ENCOUNTER — Emergency Department (HOSPITAL_COMMUNITY)
Admission: EM | Admit: 2012-12-14 | Discharge: 2012-12-14 | Disposition: A | Discharge status: Home / Self Care | Payer: No Typology Code available for payment source | Source: Home / Self Care

## 2012-12-14 DIAGNOSIS — I509 Heart failure, unspecified: Secondary | ICD-10-CM

## 2012-12-14 DIAGNOSIS — E039 Hypothyroidism, unspecified: Secondary | ICD-10-CM

## 2012-12-14 DIAGNOSIS — R079 Chest pain, unspecified: Secondary | ICD-10-CM

## 2012-12-14 DIAGNOSIS — I10 Essential (primary) hypertension: Secondary | ICD-10-CM

## 2012-12-14 DIAGNOSIS — I5032 Chronic diastolic (congestive) heart failure: Secondary | ICD-10-CM

## 2012-12-14 DIAGNOSIS — E785 Hyperlipidemia, unspecified: Secondary | ICD-10-CM

## 2012-12-14 DIAGNOSIS — M542 Cervicalgia: Secondary | ICD-10-CM

## 2012-12-14 LAB — LIPID PANEL
LDL Cholesterol: 73 mg/dL (ref 0–99)
Triglycerides: 129 mg/dL (ref <150)
VLDL: 26 mg/dL (ref 0–40)

## 2012-12-14 MED ORDER — OXYCODONE-ACETAMINOPHEN 5-325 MG PO TABS: 1.0000 | ORAL_TABLET | ORAL | Status: DC | PRN

## 2012-12-14 NOTE — ED Provider Notes (Signed)
History     CSN: [id number]  Arrival date & time 12/14/12  1551   First MD Initiated Contact with Patient 12/14/12 1606      Chief Complaint  [name]    (Consider location/radiation/quality/duration/timing/severity/associated sxs/prior treatment) HPI Patient is 62 year old female who presents to clinic for followup on neck pain. She describes neck pain as intermittent and sharp in etiology, occasionally radiating to bilateral shoulders, 2 weeks in duration, 7/10 in severity when present and with no specific alleviating factors. She describes the pain is aggravated by even minimal twisting of the neck, flexion or extension definitely makes it worse. Patient denies any specific traumas to the areas, no specific actions that she has performed to call with this sudden onset of pain. She was started on tramadol and Flexeril 2 weeks ago with no improvement at all. Patient denies fevers and chills, no headaches, no other systemic symptoms in addition patient would like to have triglycerides level checked because she has been on TriCor but the medication has been discontinued by [facility] program.  Past Medical History  Diagnosis Date  . Diabetes mellitus type II, [name]     Past Surgical History  Procedure Laterality Date  . Cesarean section    . Knee surgery    . Thyroid surgery      No family history on file.  History  Substance Use Topics  . Smoking status: Never Smoker   . Smokeless tobacco: Never Used  . Alcohol Use: No    OB History   Grav Para Term Preterm Abortions TAB SAB Ect Mult Living   4 4 3 1      6       Review of Systems  Constitutional: Negative for fever, chills, diaphoresis, activity change, appetite change and fatigue.  HENT: Negative for ear pain, nosebleeds, congestion, facial swelling,  rhinorrhea Eyes: Negative for pain, discharge, redness, itching and visual disturbance.  Respiratory: Negative for cough, choking, chest tightness, shortness of breath, wheezing and stridor.   Cardiovascular: Negative for chest pain, palpitations and leg swelling.  Gastrointestinal: Negative for abdominal distention.  Genitourinary: Negative for dysuria, urgency, frequency, hematuria, flank pain, decreased urine volume, difficulty urinating and dyspareunia.  Musculoskeletal: Negative for back pain, joint swelling, arthralgias and gait problem.  Neurological: Negative for dizziness, tremors, seizures, syncope, facial asymmetry, speech difficulty, weakness, light-headedness, numbness and headaches.  Hematological: Negative for adenopathy. Does not bruise/bleed easily.  Psychiatric/Behavioral: Negative for hallucinations, behavioral problems, confusion, dysphoric mood, decreased concentration and agitation.    Allergies  Sulfa antibiotics and Amoxicillin-pot clavulanate  Home Medications   Current Outpatient Rx  Name  Route  Sig  Dispense  Refill  . amLODipine (NORVASC) 10 MG tablet   Oral   Take 1 tablet (10 mg total) by mouth daily.   30 tablet   2   . Choline Fenofibrate (TRILIPIX) 135 MG capsule   Oral   Take 1 capsule (135 mg total) by mouth daily.   30 capsule   3   . docusate sodium 100 MG CAPS   Oral   Take 100 mg by mouth 2 (two) times daily.   10 capsule   0   . furosemide (LASIX) 40 MG tablet   Oral   Take 1 tablet (40 mg total) by mouth  daily.   30 tablet   2   . gabapentin (NEURONTIN) 300 MG capsule   Oral   Take 1 capsule (300 mg total) by mouth 4 (four) times daily.   120 capsule   2   . insulin aspart (NOVOLOG) 100 UNIT/ML injection   Subcutaneous   Inject 20 Units into the skin 3 (three) times daily before meals.         . insulin glargine (LANTUS) 100 UNIT/ML injection   Subcutaneous   Inject 60 Units into the skin at bedtime.   10 mL   3    . levothyroxine (SYNTHROID, LEVOTHROID) 100 MCG tablet   Oral   Take 1 tablet (100 mcg total) by mouth daily.   30 tablet   2   . lisinopril (PRINIVIL,ZESTRIL) 40 MG tablet   Oral   Take 1 tablet (40 mg total) by mouth daily.   30 tablet   3   . metFORMIN (GLUCOPHAGE) 1000 MG tablet   Oral   Take 1 tablet (1,000 mg total) by mouth 2 (two) times daily with a meal.   60 tablet   2   . metoprolol succinate (TOPROL XL) 50 MG 24 hr tablet   Oral   Take 1 tablet (50 mg total) by mouth daily. Take with or immediately following a meal.   30 tablet   3   . Multiple Vitamins-Minerals (EYE VITAMINS) CAPS      1 pill Po daily, 1 month supply   30 capsule   2   . nitroGLYCERIN (NITROSTAT) 0.4 MG SL tablet   Sublingual   Place 1 tablet (0.4 mg total) under the tongue every 5 (five) minutes as needed for chest pain.   30 tablet   0   . oxyCODONE-acetaminophen (PERCOCET) 5-325 MG per tablet   Oral   Take 1 tablet by mouth every 4 (four) hours as needed for pain.   30 tablet   0   . pantoprazole (PROTONIX) 40 MG tablet   Oral   Take 1 tablet (40 mg total) by mouth 2 (two) times daily.   60 tablet   2   . potassium chloride (K-DUR) 10 MEQ tablet   Oral   Take 1 tablet (10 mEq total) by mouth 2 (two) times daily.   30 tablet   2   . simvastatin (ZOCOR) 20 MG tablet   Oral   Take 1 tablet (20 mg total) by mouth at bedtime.   30 tablet   2     BP 111/71  Pulse 102  Temp(Src) 98 F (36.7 C) (Oral)  SpO2 95%  Physical Exam  Constitutional: Appears well-developed and well-nourished. No distress.  HENT: Normocephalic. External right and left ear normal. Oropharynx is clear and moist.  Eyes: Conjunctivae and EOM are normal. PERRLA, no scleral icterus.  Neck: No JVD. No tracheal deviation. No thyromegaly.  paraspinal muscle tenderness in the neck area cervical spine, difficulty with extension and flexion, difficulty with twisting left and right CVS: RRR, S1/S2 +, no  murmurs, no gallops, no carotid bruit.  Pulmonary: Effort and breath sounds normal, no stridor, rhonchi, wheezes, rales.  Abdominal: Soft. BS +,  no distension, tenderness, rebound or guarding.  Musculoskeletal: Normal range of motion. No edema and no tenderness.  Lymphadenopathy: No lymphadenopathy noted, cervical, inguinal. Neuro: Alert. Normal reflexes, muscle tone coordination. No cranial nerve deficit. Skin: Skin is warm and dry. No rash noted. Not diaphoretic. No erythema. No pallor.  Psychiatric: Normal mood  and affect. Behavior, judgment, thought content normal.    ED Course  Procedures (including critical care time)  Labs Reviewed  LIPID PANEL   No results found.   1. Hypertension - reasonable control, continue metoprolol, Norvasc  2. Hypothyroidism - continue Synthroid   3. Hyperlipemia - will check lipid panel today and decide if medication for triglyceride control is needed   4. Neck pain - unclear etiology recent x-ray of the with no acute abnormalities, we'll proceed with MRI of the neck  for further evaluation, continue analgesia for pain control.    MDM  Neck pain, hypertension, hyperlipidemia        Dorothea Ogle, MD 12/14/12 1622

## 2012-12-14 NOTE — ED Notes (Signed)
Pt here for follow up and has some medication questions.

## 2012-12-15 NOTE — ED Notes (Signed)
appt for MRI is for 12/14/12

## 2012-12-15 NOTE — ED Notes (Signed)
Patient was scheduled for an mri for Friday-told me appt was not good for her, gave her the number for radiology so she could schedule a good day for scan

## 2012-12-16 ENCOUNTER — Ambulatory Visit (HOSPITAL_COMMUNITY): Admission: RE | Admit: 2012-12-16 | Payer: No Typology Code available for payment source | Source: Ambulatory Visit

## 2012-12-21 ENCOUNTER — Ambulatory Visit (HOSPITAL_COMMUNITY)
Admission: RE | Admit: 2012-12-21 | Discharge: 2012-12-21 | Disposition: A | Discharge status: Home / Self Care | Payer: No Typology Code available for payment source | Source: Ambulatory Visit | Attending: Internal Medicine | Admitting: Internal Medicine

## 2012-12-21 DIAGNOSIS — D1809 Hemangioma of other sites: Secondary | ICD-10-CM | POA: Insufficient documentation

## 2012-12-21 DIAGNOSIS — R209 Unspecified disturbances of skin sensation: Secondary | ICD-10-CM | POA: Insufficient documentation

## 2012-12-21 DIAGNOSIS — M25519 Pain in unspecified shoulder: Secondary | ICD-10-CM | POA: Insufficient documentation

## 2012-12-21 DIAGNOSIS — M5030 Other cervical disc degeneration, unspecified cervical region: Secondary | ICD-10-CM | POA: Insufficient documentation

## 2012-12-21 DIAGNOSIS — M4802 Spinal stenosis, cervical region: Secondary | ICD-10-CM | POA: Insufficient documentation

## 2012-12-21 DIAGNOSIS — M5020 Other cervical disc displacement, unspecified cervical region: Secondary | ICD-10-CM | POA: Insufficient documentation

## 2012-12-21 DIAGNOSIS — M542 Cervicalgia: Secondary | ICD-10-CM

## 2012-12-22 ENCOUNTER — Ambulatory Visit (HOSPITAL_COMMUNITY)
Admission: RE | Admit: 2012-12-22 | Discharge: 2012-12-22 | Disposition: A | Discharge status: Home / Self Care | Payer: No Typology Code available for payment source | Source: Ambulatory Visit | Attending: Family Medicine | Admitting: Family Medicine

## 2012-12-22 DIAGNOSIS — Z1231 Encounter for screening mammogram for malignant neoplasm of breast: Secondary | ICD-10-CM | POA: Insufficient documentation

## 2012-12-26 DIAGNOSIS — R1310 Dysphagia, unspecified: Secondary | ICD-10-CM | POA: Insufficient documentation

## 2012-12-29 ENCOUNTER — Telehealth (HOSPITAL_COMMUNITY): Payer: Self-pay

## 2012-12-29 NOTE — ED Notes (Signed)
Returned patients phone call Not available Left message on machine to call office

## 2013-01-18 ENCOUNTER — Emergency Department (INDEPENDENT_AMBULATORY_CARE_PROVIDER_SITE_OTHER)
Admission: EM | Admit: 2013-01-18 | Discharge: 2013-01-18 | Disposition: A | Discharge status: Home / Self Care | Payer: No Typology Code available for payment source | Source: Home / Self Care

## 2013-01-18 ENCOUNTER — Encounter (HOSPITAL_COMMUNITY): Payer: Self-pay

## 2013-01-18 DIAGNOSIS — M5412 Radiculopathy, cervical region: Secondary | ICD-10-CM

## 2013-01-18 DIAGNOSIS — M5010 Cervical disc disorder with radiculopathy, unspecified cervical region: Secondary | ICD-10-CM

## 2013-01-18 DIAGNOSIS — N951 Menopausal and female climacteric states: Secondary | ICD-10-CM

## 2013-01-18 DIAGNOSIS — M542 Cervicalgia: Secondary | ICD-10-CM

## 2013-01-18 DIAGNOSIS — R232 Flushing: Secondary | ICD-10-CM

## 2013-01-18 MED ORDER — OXYCODONE-ACETAMINOPHEN 5-325 MG PO TABS: 1.0000 | ORAL_TABLET | ORAL | Status: DC | PRN

## 2013-01-18 MED ORDER — CHOLINE FENOFIBRATE 135 MG PO CPDR: 135.0000 mg | DELAYED_RELEASE_CAPSULE | Freq: Every day | ORAL | Status: DC

## 2013-01-18 MED ORDER — GABAPENTIN 300 MG PO CAPS: 300.0000 mg | ORAL_CAPSULE | Freq: Four times a day (QID) | ORAL | Status: DC

## 2013-01-18 MED ORDER — LEVOTHYROXINE SODIUM 100 MCG PO TABS: 100.0000 ug | ORAL_TABLET | Freq: Every day | ORAL | Status: DC

## 2013-01-18 MED ORDER — METOPROLOL SUCCINATE ER 50 MG PO TB24: 50.0000 mg | ORAL_TABLET | Freq: Every day | ORAL | Status: DC

## 2013-01-18 MED ORDER — PANTOPRAZOLE SODIUM 40 MG PO TBEC: 40.0000 mg | DELAYED_RELEASE_TABLET | Freq: Every day | ORAL | Status: DC

## 2013-01-18 MED ORDER — POTASSIUM CHLORIDE ER 10 MEQ PO TBCR: 20.0000 meq | EXTENDED_RELEASE_TABLET | Freq: Two times a day (BID) | ORAL | Status: DC

## 2013-01-18 MED ORDER — METHYLPREDNISOLONE 4 MG PO KIT: PACK | ORAL | Status: DC

## 2013-01-18 MED ORDER — INSULIN ASPART 100 UNIT/ML ~~LOC~~ SOLN: 20.0000 [IU] | Freq: Three times a day (TID) | SUBCUTANEOUS | Status: DC

## 2013-01-18 MED ORDER — INSULIN GLARGINE 100 UNIT/ML ~~LOC~~ SOLN: 60.0000 [IU] | Freq: Every day | SUBCUTANEOUS | Status: DC

## 2013-01-18 MED ORDER — CLONIDINE HCL 0.1 MG PO TABS: 0.1000 mg | ORAL_TABLET | Freq: Two times a day (BID) | ORAL | Status: DC

## 2013-01-18 MED ORDER — METFORMIN HCL 1000 MG PO TABS: 1000.0000 mg | ORAL_TABLET | Freq: Two times a day (BID) | ORAL | Status: DC

## 2013-01-18 MED ORDER — SIMVASTATIN 20 MG PO TABS: 20.0000 mg | ORAL_TABLET | Freq: Every day | ORAL | Status: DC

## 2013-01-18 MED ORDER — FUROSEMIDE 40 MG PO TABS: 40.0000 mg | ORAL_TABLET | Freq: Every day | ORAL | Status: DC

## 2013-01-18 MED ORDER — DSS 100 MG PO CAPS: 100.0000 mg | ORAL_CAPSULE | Freq: Two times a day (BID) | ORAL | Status: DC

## 2013-01-18 MED ORDER — LISINOPRIL 40 MG PO TABS: 40.0000 mg | ORAL_TABLET | Freq: Every day | ORAL | Status: DC

## 2013-01-18 MED ORDER — AMLODIPINE BESYLATE 10 MG PO TABS: 10.0000 mg | ORAL_TABLET | Freq: Every day | ORAL | Status: DC

## 2013-01-18 NOTE — ED Notes (Signed)
Follow up neck pain

## 2013-01-18 NOTE — ED Provider Notes (Signed)
History     CSN: [id number]  Arrival date & time 01/18/13  1721   None     Chief Complaint  [name]   HPI Ms. Struve has multiple chronic medical problems, but she comes in today specifically to get the results of her MRI done a few weeks ago. She continues to have severe neck pain, back pain, weakness in her upper and lowertt exremties,pain in her arms is described as electrical, lancing pain worse with certain movements, she has severe pain with neck extension. MRI was markedly abnormal. Seh also need medication refills. She has also had multiple admission for chest pain, rulled out for cardiac, MRI suggests this may be musculoskeletal.  Past Medical History  Diagnosis Date  . Diabetes mellitus type II, [name]     Past Surgical History  Procedure Laterality Date  . Cesarean section    . Knee surgery    . Thyroid surgery      No family history on file.  History  Substance Use Topics  . Smoking status: Never Smoker   . Smokeless tobacco: Never Used  . Alcohol Use: No    OB History   Grav Para Term Preterm Abortions TAB SAB Ect Mult Living   4 4 3 1      6       Review of Systems  Constitutional: Positive for activity change and fatigue.  HENT: Positive for neck pain and neck stiffness.   Eyes: Negative.   Respiratory: Negative.   Cardiovascular: Positive for chest pain. Negative for palpitations.  Gastrointestinal: Negative.   Endocrine: Negative.   Genitourinary: Negative.   Musculoskeletal: Positive for arthralgias.  Skin:       Hot flashes, sev and frequent  Allergic/Immunologic: Negative.   Neurological: Positive for weakness, light-headedness and headaches.  Psychiatric/Behavioral: Positive for sleep disturbance. The patient is nervous/anxious.     Allergies  Sulfa antibiotics and  Amoxicillin-pot clavulanate  Home Medications   Current Outpatient Rx  Name  Route  Sig  Dispense  Refill  . amLODipine (NORVASC) 10 MG tablet   Oral   Take 1 tablet (10 mg total) by mouth daily.   30 tablet   2   . Choline Fenofibrate (TRILIPIX) 135 MG capsule   Oral   Take 1 capsule (135 mg total) by mouth daily.   30 capsule   3   . docusate sodium 100 MG CAPS   Oral   Take 100 mg by mouth 2 (two) times daily.   10 capsule   0   . furosemide (LASIX) 40 MG tablet   Oral   Take 1 tablet (40 mg total) by mouth daily.   30 tablet   2   . gabapentin (NEURONTIN) 300 MG capsule   Oral   Take 1 capsule (300 mg total) by mouth 4 (four) times daily.   120 capsule   2   . insulin aspart (NOVOLOG) 100 UNIT/ML injection   Subcutaneous   Inject 20 Units into the skin 3 (three) times daily before meals.         . insulin glargine (LANTUS) 100 UNIT/ML injection   Subcutaneous   Inject 60 Units into the skin at bedtime.   10 mL   3   . levothyroxine (SYNTHROID, LEVOTHROID)  100 MCG tablet   Oral   Take 1 tablet (100 mcg total) by mouth daily.   30 tablet   2   . lisinopril (PRINIVIL,ZESTRIL) 40 MG tablet   Oral   Take 1 tablet (40 mg total) by mouth daily.   30 tablet   3   . metFORMIN (GLUCOPHAGE) 1000 MG tablet   Oral   Take 1 tablet (1,000 mg total) by mouth 2 (two) times daily with a meal.   60 tablet   2   . metoprolol succinate (TOPROL XL) 50 MG 24 hr tablet   Oral   Take 1 tablet (50 mg total) by mouth daily. Take with or immediately following a meal.   30 tablet   3   . Multiple Vitamins-Minerals (EYE VITAMINS) CAPS      1 pill Po daily, 1 month supply   30 capsule   2   . nitroGLYCERIN (NITROSTAT) 0.4 MG SL tablet   Sublingual   Place 1 tablet (0.4 mg total) under the tongue every 5 (five) minutes as needed for chest pain.   30 tablet   0   . oxyCODONE-acetaminophen (PERCOCET) 5-325 MG per tablet   Oral   Take 1 tablet by mouth every 4  (four) hours as needed for pain.   30 tablet   0   . pantoprazole (PROTONIX) 40 MG tablet   Oral   Take 1 tablet (40 mg total) by mouth 2 (two) times daily.   60 tablet   2   . potassium chloride (K-DUR) 10 MEQ tablet   Oral   Take 1 tablet (10 mEq total) by mouth 2 (two) times daily.   30 tablet   2   . simvastatin (ZOCOR) 20 MG tablet   Oral   Take 1 tablet (20 mg total) by mouth at bedtime.   30 tablet   2     BP 146/76  Pulse 94  Temp(Src) 98.4 F (36.9 C) (Oral)  Resp 15  SpO2 96%  Physical Exam  Constitutional: She is oriented to person, place, and time. She appears well-developed and well-nourished.  HENT:  Head: Normocephalic and atraumatic.  Mouth/Throat: Oropharynx is clear and moist.  Eyes: Right eye exhibits no discharge. Left eye exhibits no discharge. No scleral icterus.  Neck: Normal range of motion.  Limited ROM, pai with flexion and extension. Point tenderness.  Cardiovascular: Normal rate and regular rhythm.   Pulmonary/Chest: Breath sounds normal.  Abdominal: Soft. Bowel sounds are normal.  Musculoskeletal: She exhibits tenderness.  Neurological: She is alert and oriented to person, place, and time.  Skin: Skin is warm and dry.  Psychiatric:  tearful    ED Course  Procedures (including critical care time)  Labs Reviewed - No data to display No results found.   No diagnosis found.    MDM   1. 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 The MRI needs specialist evaluation, namely and urgent neurosurgical evaluation.  Referral to [facility] for neurosurgery eval ASAP  Refilled oxycodone  Medrol dose pack  Neurontin started  2. Hot Flashes, unknown etilogy, I suspect medication side effect vs. Post menopausal syndrome, if continues needs complete w/u, will start clonidine which will help with hot flashes, pain and  BP.  Other chonic meds refilled at today's visit.        Edsel Petrin, DO 01/22/13 2227

## 2013-01-26 ENCOUNTER — Encounter (HOSPITAL_COMMUNITY): Payer: Self-pay | Admitting: *Deleted

## 2013-01-26 ENCOUNTER — Inpatient Hospital Stay (HOSPITAL_COMMUNITY)
Admission: AD | Admit: 2013-01-26 | Discharge: 2013-01-27 | Disposition: A | Discharge status: Home / Self Care | Payer: No Typology Code available for payment source | Source: Ambulatory Visit | Attending: Obstetrics and Gynecology | Admitting: Obstetrics and Gynecology

## 2013-01-26 DIAGNOSIS — R3915 Urgency of urination: Secondary | ICD-10-CM | POA: Insufficient documentation

## 2013-01-26 DIAGNOSIS — N898 Other specified noninflammatory disorders of vagina: Secondary | ICD-10-CM

## 2013-01-26 DIAGNOSIS — Z202 Contact with and (suspected) exposure to infections with a predominantly sexual mode of transmission: Secondary | ICD-10-CM | POA: Insufficient documentation

## 2013-01-26 LAB — URINALYSIS, ROUTINE W REFLEX MICROSCOPIC
Glucose, UA: NEGATIVE mg/dL
Hgb urine dipstick: NEGATIVE
Ketones, ur: 15 mg/dL — AB
Leukocytes, UA: NEGATIVE
pH: 5.5 (ref 5.0–8.0)

## 2013-01-26 LAB — WET PREP, GENITAL: Yeast Wet Prep HPF POC: NONE SEEN

## 2013-01-26 NOTE — MAU Note (Signed)
Pt states she thinks she may vaginitis related to sex/broken condom 3 days ago and has a fishy smell. Denies pains or discharge or bleeding.

## 2013-01-26 NOTE — MAU Provider Note (Signed)
Chief Complaint: Vaginitis   First Provider Initiated Contact with Patient 01/26/13 2331     SUBJECTIVE HPI: Anita James is a 62 y.o. [phone] post-menopausal female who presents with vaginal discharge w/ odor x several days after the condom slipped off of her partner during intercourse and pressure and urgency w/ urination. Does not have gynecologist. Wants STD testing.   Past Medical History  Diagnosis Date  . Diabetes mellitus type II, [name]    OB History   Grav Para Term Preterm Abortions TAB SAB Ect Mult Living   4 3 2 1 1   1 2 6      # Outc Date GA Lbr Len/2nd Wgt Sex Del Anes PTL Lv   1 PRE     M SVD   Yes   2A TRM     F LTCS   Yes   2B      F LTCS   Yes   3A TRM     F LTCS   Yes   3B      F LTCS   Yes   4 ECT              Past Surgical History  Procedure Laterality Date  . Cesarean section    . Knee surgery    . Thyroid surgery     History   Social History  . Marital Status: Divorced    Spouse Name: N/A    Number of Children: N/A  . Years of Education: N/A   Occupational History  . Not on file.   Social History Main Topics  . Smoking status: Never Smoker   . Smokeless tobacco: Never Used  . Alcohol Use: No  . Drug Use: No  . Sexually Active:    Other Topics Concern  . Not on file   Social History Narrative  . No narrative on file   No current facility-administered medications on file prior to encounter.   Current Outpatient Prescriptions on File Prior to Encounter  Medication Sig Dispense Refill  . amLODipine (NORVASC) 10 MG tablet Take 1 tablet (10 mg total) by mouth daily.  30 tablet  2  . Choline Fenofibrate (TRILIPIX) 135 MG capsule Take 1 capsule (135 mg total) by mouth daily.  30 capsule  3  . cloNIDine (CATAPRES) 0.1 MG tablet Take 1 tablet (0.1 mg total) by mouth 2 (two) times daily.  60 tablet  11   . Docusate Sodium (DSS) 100 MG CAPS Take 100 mg by mouth 2 (two) times daily.  30 each  3  . furosemide (LASIX) 40 MG tablet Take 1 tablet (40 mg total) by mouth daily.  30 tablet  2  . gabapentin (NEURONTIN) 300 MG capsule Take 1 capsule (300 mg total) by mouth 4 (four) times daily.  120 capsule  2  . insulin aspart (NOVOLOG) 100 UNIT/ML injection Inject 20 Units into the skin 3 (three) times daily before meals.  1 vial  12  . insulin glargine (LANTUS) 100 UNIT/ML injection Inject 0.6 mLs (60 Units total) into the skin at bedtime.  10 mL  12  . levothyroxine (SYNTHROID, LEVOTHROID) 100 MCG tablet Take 1 tablet (100 mcg total) by mouth daily.  30 tablet  12  . lisinopril (PRINIVIL,ZESTRIL) 40 MG tablet Take 1 tablet (40 mg  total) by mouth daily.  30 tablet  12  . metFORMIN (GLUCOPHAGE) 1000 MG tablet Take 1 tablet (1,000 mg total) by mouth 2 (two) times daily with a meal.  60 tablet  12  . methylPREDNISolone (MEDROL DOSEPAK) 4 MG tablet follow package directions  21 tablet  0  . metoprolol succinate (TOPROL XL) 50 MG 24 hr tablet Take 1 tablet (50 mg total) by mouth daily. Take with or immediately following a meal.  30 tablet  12  . Multiple Vitamins-Minerals (EYE VITAMINS) CAPS 1 pill Po daily, 1 month supply  30 capsule  2  . nitroGLYCERIN (NITROSTAT) 0.4 MG SL tablet Place 1 tablet (0.4 mg total) under the tongue every 5 (five) minutes as needed for chest pain.  30 tablet  0  . oxyCODONE-acetaminophen (PERCOCET) 5-325 MG per tablet Take 1 tablet by mouth every 4 (four) hours as needed for pain.  30 tablet  0  . pantoprazole (PROTONIX) 40 MG tablet Take 1 tablet (40 mg total) by mouth daily.  60 tablet  2  . potassium chloride (K-DUR) 10 MEQ tablet Take 2 tablets (20 mEq total) by mouth 2 (two) times daily.  30 tablet  2  . simvastatin (ZOCOR) 20 MG tablet Take 1 tablet (20 mg total) by mouth at bedtime.  30 tablet  2  . [DISCONTINUED] fenofibrate (TRICOR) 145 MG tablet Take 1 tablet (145 mg  total) by mouth daily.  30 tablet  2  . [DISCONTINUED] insulin detemir (LEVEMIR) 100 UNIT/ML injection Inject 60 Units into the skin at bedtime.  10 mL  1  . [DISCONTINUED] metoprolol (LOPRESSOR) 50 MG tablet Take 1 tablet (50 mg total) by mouth 2 (two) times daily.  60 tablet  2   Allergies  Allergen Reactions  . Sulfa Antibiotics Shortness Of Breath  . Amoxicillin-Pot Clavulanate     Combination of medications tear lining of stomach    ROS: Neg for fever, chills, abd pain, flank pain, dysuria, frequency, dyspareunia.   OBJECTIVE Blood pressure 132/70, pulse 86, temperature 98.3 F (36.8 C), temperature source Oral, resp. rate 20, height 5\' 5"  (1.651 m), weight 116.631 kg (257 lb 2 oz). GENERAL: Well-developed, well-nourished female in no acute distress.  HEENT: Normocephalic HEART: normal rate RESP: normal effort ABDOMEN: Soft, non-tender EXTREMITIES: Nontender, no edema NEURO: Alert and oriented SPECULUM EXAM: NEFG, scant amount of thin, white, odorless discharge, no blood noted, cervix clean. No lesions.  BIMANUAL: cervix closed; UTA uterine size due to body habitus, mild bilat adnexal tenderness. No masses or CMT.   LAB RESULTS Results for orders placed during the hospital encounter of 01/26/13 (from the past 24 hour(s))  WET PREP, GENITAL     Status: Abnormal   Collection Time    01/26/13  9:15 PM      Result Value Range   Yeast Wet Prep HPF POC NONE SEEN  NONE SEEN   Trich, Wet Prep NONE SEEN  NONE SEEN   Clue Cells Wet Prep HPF POC NONE SEEN  NONE SEEN   WBC, Wet Prep HPF POC FEW (*) NONE SEEN  URINALYSIS, ROUTINE W REFLEX MICROSCOPIC     Status: Abnormal   Collection Time    01/26/13 10:06 PM      Result Value Range   Color, Urine YELLOW  YELLOW   APPearance CLEAR  CLEAR   Specific Gravity, Urine >1.030 (*) 1.005 - 1.030   pH 5.5  5.0 - 8.0   Glucose, UA NEGATIVE  NEGATIVE mg/dL  Hgb urine dipstick NEGATIVE  NEGATIVE   Bilirubin Urine NEGATIVE  NEGATIVE    Ketones, ur 15 (*) NEGATIVE mg/dL   Protein, ur NEGATIVE  NEGATIVE mg/dL   Urobilinogen, UA 0.2  0.0 - 1.0 mg/dL   Nitrite NEGATIVE  NEGATIVE   Leukocytes, UA NEGATIVE  NEGATIVE    IMAGING No results found.  MAU COURSE  ASSESSMENT 1. Urinary urgency   2. Vaginal discharge   3. Possible exposure to STD    PLAN Discharge home in stable condition. Urine and GC/CT culture pending.     Follow-up Information   Follow up with [facility]. (for routine gynecology care or STD testing)    Contact information:   [address] [place] [address] [phone]      Follow up with [facility] [place]. (routine gynecology care or STD testing)    Contact information:   [address] [place] [address] [phone]       Medication List    TAKE these medications       amLODipine 10 MG tablet  Commonly known as:  NORVASC  Take 1 tablet (10 mg total) by mouth daily.     Choline Fenofibrate 135 MG capsule  Commonly known as:  TRILIPIX  Take 1 capsule (135 mg total) by mouth daily.     cloNIDine 0.1 MG tablet  Commonly known as:  CATAPRES  Take 1 tablet (0.1 mg total) by mouth 2 (two) times daily.     DSS 100 MG Caps  Take 100 mg by mouth 2 (two) times daily.     Eye Vitamins Caps  1 pill Po daily, 1 month supply     furosemide 40 MG tablet  Commonly known as:  LASIX  Take 1 tablet (40 mg total) by mouth daily.     gabapentin 300 MG capsule  Commonly known as:  NEURONTIN  Take 1 capsule (300 mg total) by mouth 4 (four) times daily.     insulin aspart 100 UNIT/ML injection  Commonly known as:  novoLOG  Inject 20 Units into the skin 3 (three) times daily before meals.     insulin glargine 100 UNIT/ML injection  Commonly known as:  LANTUS  Inject 0.6 mLs (60 Units total) into the skin at bedtime.     levothyroxine 100 MCG tablet  Commonly known as:  SYNTHROID, LEVOTHROID  Take 1 tablet (100 mcg total) by mouth daily.     lisinopril  40 MG tablet  Commonly known as:  PRINIVIL,ZESTRIL  Take 1 tablet (40 mg total) by mouth daily.     metFORMIN 1000 MG tablet  Commonly known as:  GLUCOPHAGE  Take 1 tablet (1,000 mg total) by mouth 2 (two) times daily with a meal.     methylPREDNISolone 4 MG tablet  Commonly known as:  MEDROL DOSEPAK  follow package directions     metoprolol succinate 50 MG 24 hr tablet  Commonly known as:  TOPROL XL  Take 1 tablet (50 mg total) by mouth daily. Take with or immediately following a meal.     nitroGLYCERIN 0.4 MG SL tablet  Commonly known as:  NITROSTAT  Place 1 tablet (0.4 mg total) under the tongue every 5 (five) minutes as needed for chest pain.     oxyCODONE-acetaminophen 5-325 MG per tablet  Commonly known as:  PERCOCET  Take 1 tablet by mouth every 4 (four) hours as needed for pain.     pantoprazole 40 MG tablet  Commonly known as:  PROTONIX  Take 1 tablet (40 mg total) by mouth daily.     potassium chloride 10 MEQ tablet  Commonly known as:  K-DUR  Take 2 tablets (20 mEq total) by mouth 2 (two) times daily.     simvastatin 20 MG tablet  Commonly known as:  ZOCOR  Take 1 tablet (20 mg total) by mouth at bedtime.       Blaine, CNM 01/27/2013  12:05 AM

## 2013-01-26 NOTE — MAU Note (Addendum)
PT SAYS SHE DOES   NOT HAVE CYCLE ANYMORE- LAST 1990.     PT SAYS TONIGHT  SHE FEELS  PRESSURE  WHEN TIME TO  URINATE-  STARTED LAST NIGHT.    SAYS SHE HAS FISHY ODOR.- STARTED TODAY.    LAST SEX- 01-24-2013-  SAME PARTNER FOR SEVERAL MTHS.-   USE CONDOMS.     WAS  SEEN AT [facility]-2013- CLOSED-  HAS SEEN NOBODY SINCE.

## 2013-01-27 DIAGNOSIS — R3915 Urgency of urination: Secondary | ICD-10-CM

## 2013-01-27 LAB — GC/CHLAMYDIA PROBE AMP: CT Probe RNA: NEGATIVE

## 2013-01-27 NOTE — MAU Provider Note (Signed)
Attestation of Attending Supervision of Advanced Practitioner: Evaluation and management procedures were performed by the PA/NP/CNM/OB Fellow under my supervision/collaboration. Chart reviewed and agree with management and plan.  Taylan Mayhan V 01/27/2013 1:01 PM

## 2013-02-23 ENCOUNTER — Ambulatory Visit: Payer: Self-pay | Attending: Family Medicine | Admitting: Internal Medicine

## 2013-02-23 ENCOUNTER — Encounter: Payer: Self-pay | Admitting: Internal Medicine

## 2013-02-23 MED ORDER — METHOCARBAMOL 500 MG PO TABS: 500.0000 mg | ORAL_TABLET | Freq: Four times a day (QID) | ORAL | Status: DC

## 2013-02-23 MED ORDER — GABAPENTIN 300 MG PO CAPS: 300.0000 mg | ORAL_CAPSULE | Freq: Four times a day (QID) | ORAL | Status: DC

## 2013-02-23 MED ORDER — OXYCODONE HCL 5 MG PO TABS: 5.0000 mg | ORAL_TABLET | ORAL | Status: DC | PRN

## 2013-02-23 NOTE — Progress Notes (Unsigned)
[name], female   DOB: 1951-04-28, 62 y.o.   MRN: [id number]  .62 year old woman is seen for a post hospital followup visit. The patient was admitted to [facility] on 10/05/12 with left-sided chest pain as well as left-sided neck pain shoulder pain and left arm radiation she did not have any enzyme evidence for myocardial damage. She does have multiple risk factors for ischemic heart disease including diabetes hypertension and hypercholesterolemia and sedentary lifestyle. In the hospital she underwent a 2 day Lexa scan Myoview stress test which was completed on 10/08/11 and showed no evidence of ischemia and she had an ejection fraction of 72% with no wall motion abnormalities. At discharge it was felt that some of her symptoms might be related to gastrointestinal factors and that if her symptoms persisted she should see a GI specialist. Since discharge her pain has been improved. She is still however having some discomfort extending up the posterior lateral aspect of the left and neck and she has some difficulty in turning her head to the left. She states that she has tried   prednisone pack for 21 days.And gained 20 lbs . She has also been taking gabapentin, or Percocet with no pain relief  Current Outpatient Prescriptions   Medication  Sig  Dispense  Refill   .  amLODipine (NORVASC) 10 MG tablet  Take 1 tablet (10 mg total) by mouth daily.  30 tablet  2   .  docusate sodium 100 MG CAPS  Take 100 mg by mouth 2 (two) times daily.  10 capsule  0   .  fenofibrate (TRICOR) 145 MG tablet  Take 1 tablet (145 mg total) by mouth daily.  30 tablet  2   .  furosemide (LASIX) 40 MG tablet  Take 1 tablet (40 mg total) by mouth daily.  30 tablet  2   .  gabapentin (NEURONTIN) 300 MG capsule  Take 1 capsule (300 mg total) by mouth 4 (four) times daily.  120 capsule  2   .  insulin aspart (NOVOLOG) 100 UNIT/ML injection  Inject 20 Units into the skin 3 (three) times daily before meals.     .   insulin glargine (LANTUS) 100 UNIT/ML injection  Inject 60 Units into the skin at bedtime.  10 mL  3   .  levothyroxine (SYNTHROID, LEVOTHROID) 100 MCG tablet  Take 1 tablet (100 mcg total) by mouth daily.  30 tablet  2   .  lisinopril (PRINIVIL,ZESTRIL) 40 MG tablet  Take 1 tablet (40 mg total) by mouth daily.  30 tablet  3   .  metFORMIN (GLUCOPHAGE) 1000 MG tablet  Take 1 tablet (1,000 mg total) by mouth 2 (two) times daily with a meal.  60 tablet  2   .  metoprolol (LOPRESSOR) 50 MG tablet  Take 1 tablet (50 mg total) by mouth 2 (two) times daily.  60 tablet  2   .  Multiple Vitamins-Minerals (EYE VITAMINS) CAPS  1 pill Po daily, 1 month supply  30 capsule  2   .  nitroGLYCERIN (NITROSTAT) 0.4 MG SL tablet  Place 1 tablet (0.4 mg total) under the tongue every 5 (five) minutes as needed for chest pain.  30 tablet  0   .  pantoprazole (PROTONIX) 40 MG tablet  Take 1 tablet (40 mg total) by mouth 2 (two) times daily.  60 tablet  2   .  potassium chloride (K-DUR) 10 MEQ tablet  Take 1 tablet (  10 mEq total) by mouth 2 (two) times daily.  30 tablet  2   .  simvastatin (ZOCOR) 20 MG tablet  Take 1 tablet (20 mg total) by mouth at bedtime.  30 tablet  2   .  [DISCONTINUED] insulin detemir (LEVEMIR) 100 UNIT/ML injection  Inject 60 Units into the skin at bedtime.  10 mL  1    Allergies   Allergen  Reactions   .  Sulfa Antibiotics  Shortness Of Breath   .  Amoxicillin-Pot Clavulanate      Combination of medications tear lining of stomach    Patient Active Problem List   Diagnosis   .  Hypertension   .  Hypothyroidism   .  Hyperlipemia   .  Diabetes mellitus type II, uncontrolled   .  GERD (gastroesophageal reflux disease)   .  Diastolic CHF, chronic   .  Chest pain    History   Smoking status   .  Never Smoker   Smokeless tobacco   .  Never Used    History   Alcohol Use  No   No family history on file.  Review of Systems:  Constitutional: no fever chills diaphoresis or fatigue or  change in weight.  Head and neck: no hearing loss, no epistaxis, no photophobia or visual disturbance.  Respiratory: No cough, shortness of breath or wheezing.  Cardiovascular: [name] peripheral edema, palpitations.  Gastrointestinal: No abdominal distention, no abdominal pain, no change in bowel habits hematochezia or melena.  Genitourinary: No dysuria, no frequency, no urgency, no nocturia.  Musculoskeletal:No arthralgias, no back pain, no gait disturbance or myalgias.  Neurological: No dizziness, no headaches, no numbness, no seizures, no syncope, no weakness, no tremors.  Hematologic: No lymphadenopathy, no easy bruising.  Psychiatric: No confusion, no hallucinations, no sleep disturbance.  Physical Exam:  Filed Vitals:      BP:  140/76   Pulse:  98   Resp:  18   the general appearance reveals a well-developed well-nourished somewhat overweight woman in no distress.The head and neck exam reveals pupils equal and reactive. Extraocular movements are full. There is no scleral icterus. The mouth and pharynx are normal. The neck is supple. The carotids reveal no bruits. The jugular venous pressure is normal. The thyroid is not enlarged. There is no lymphadenopathy. The chest is clear to percussion and auscultation. There are no rales or rhonchi. Expansion of the chest is symmetrical. The precordium is quiet. The first heart sound is normal. The second heart sound is physiologically split. There is no murmur gallop rub or click. There is no abnormal lift or heave. The abdomen is soft and nontender. The bowel sounds are normal. The liver and spleen are not enlarged. There are no abdominal masses. There are no abdominal bruits. Extremities reveal good pedal pulses. There is no phlebitis. There is trace ankle edema. There is no cyanosis or clubbing. Strength is normal and symmetrical in all extremities. There is no lateralizing weakness. There are no sensory deficits. The skin is warm and dry.  There is no rash.    assessment and plan Cervical radiculopathy #1 patient has had an MRI, results reviewed with the patient #2 physician-switch patient to OxyIR, continue gabapentin, start the patient on Robaxin #3 pain clinic referral provided #4 follow up with [place] in 2 weeks

## 2013-02-23 NOTE — Progress Notes (Unsigned)
Patient states that she was given a referral to ortho, but was told she did not need to be there by Doctor. Complains of pain in neck that radiates to back, head and ears.

## 2013-02-24 ENCOUNTER — Telehealth: Payer: Self-pay | Admitting: Family Medicine

## 2013-02-24 NOTE — Telephone Encounter (Signed)
Anita James from Neurology office says pt had visit on 02/22/13 but did not get MRI results in referral notes that were sent over, which they need for visit.  If already addressed please disregard, otherwise f/u with Neurology office.  Thank you

## 2013-02-27 DIAGNOSIS — M542 Cervicalgia: Secondary | ICD-10-CM | POA: Insufficient documentation

## 2013-03-01 NOTE — Telephone Encounter (Signed)
I re faxed again the progress notes and mri to [facility]

## 2013-03-09 ENCOUNTER — Telehealth: Payer: Self-pay | Admitting: Family Medicine

## 2013-03-09 ENCOUNTER — Ambulatory Visit: Payer: Self-pay | Attending: Family Medicine

## 2013-03-09 VITALS — BP 120/85 | HR 98 | Temp 98.1°F | Ht 67.0 in | Wt 267.0 lb

## 2013-03-09 DIAGNOSIS — G894 Chronic pain syndrome: Secondary | ICD-10-CM

## 2013-03-09 DIAGNOSIS — R609 Edema, unspecified: Secondary | ICD-10-CM

## 2013-03-09 MED ORDER — METHOCARBAMOL 500 MG PO TABS: 500.0000 mg | ORAL_TABLET | Freq: Four times a day (QID) | ORAL | Status: DC

## 2013-03-09 MED ORDER — GABAPENTIN 300 MG PO CAPS: 300.0000 mg | ORAL_CAPSULE | Freq: Four times a day (QID) | ORAL | Status: DC

## 2013-03-09 MED ORDER — FUROSEMIDE 20 MG PO TABS: 60.0000 mg | ORAL_TABLET | Freq: Every day | ORAL | Status: DC

## 2013-03-09 MED ORDER — OXYCODONE-ACETAMINOPHEN 5-325 MG PO TABS: 1.0000 | ORAL_TABLET | Freq: Three times a day (TID) | ORAL | Status: DC | PRN

## 2013-03-09 NOTE — Telephone Encounter (Addendum)
Pt misplaced all three scripts she received last week 02/23/13   1. gabapentin (NEURONTIN) 300 MG capsule  2. methocarbamol (ROBAXIN) 500 MG tablet  3. oxyCODONE (ROXICODONE) 5 MG immediate release tablet   and now came in person to replace them.

## 2013-03-09 NOTE — Telephone Encounter (Signed)
Prescriptions provided for robaxin and gabapentin

## 2013-03-09 NOTE — Progress Notes (Unsigned)
[name], female   DOB: February 19, 1951, 62 y.o.   MRN: [id number]   CC:  HPI:  62 year old female with a history of hypertension, left-sided neck pain and back pain, MRI done on 4/02, showing anterolisthesis, facet arthropathy, prescribed OxyIR, gabapentin, Robaxin, who has not filled any of her prescriptions because she did not have her orange card. Presents today with back pain. She also states that she has had some leg swelling progressively worse over the last couple of weeks. She denies any chest pain or shortness of breath.  Allergies  Allergen Reactions  . Sulfa Antibiotics Shortness Of Breath  . Amoxicillin-Pot Clavulanate     Combination of medications tear lining of stomach   Past Medical History  Diagnosis Date  . Diabetes mellitus type II, [name]    Current Outpatient Prescriptions on File Prior to Visit  Medication Sig Dispense Refill  . amLODipine (NORVASC) 10 MG tablet Take 1 tablet (10 mg total) by mouth daily.  30 tablet  2  . Choline Fenofibrate (TRILIPIX) 135 MG capsule Take 1 capsule (135 mg total) by mouth daily.  30 capsule  3  . cloNIDine (CATAPRES) 0.1 MG tablet Take 1 tablet (0.1 mg total) by mouth 2 (two) times daily.  60 tablet  11  . Docusate Sodium (DSS) 100 MG CAPS Take 100 mg by mouth 2 (two) times daily.  30 each  3  . insulin aspart (NOVOLOG) 100 UNIT/ML injection Inject 20 Units into the skin 3 (three) times daily before meals.  1 vial  12  . insulin glargine (LANTUS) 100 UNIT/ML injection Inject 0.6 mLs (60 Units total) into the skin at bedtime.  10 mL  12  . levothyroxine (SYNTHROID, LEVOTHROID) 100 MCG tablet Take 1 tablet (100 mcg total) by mouth daily.  30 tablet  12  . lisinopril (PRINIVIL,ZESTRIL) 40 MG tablet Take 1 tablet (40 mg total) by mouth daily.  30 tablet  12  . metFORMIN (GLUCOPHAGE)  1000 MG tablet Take 1 tablet (1,000 mg total) by mouth 2 (two) times daily with a meal.  60 tablet  12  . methocarbamol (ROBAXIN) 500 MG tablet Take 1 tablet (500 mg total) by mouth 4 (four) times daily.  30 tablet  2  . metoprolol succinate (TOPROL XL) 50 MG 24 hr tablet Take 1 tablet (50 mg total) by mouth daily. Take with or immediately following a meal.  30 tablet  12  . Multiple Vitamins-Minerals (EYE VITAMINS) CAPS 1 pill Po daily, 1 month supply  30 capsule  2  . nitroGLYCERIN (NITROSTAT) 0.4 MG SL tablet Place 1 tablet (0.4 mg total) under the tongue every 5 (five) minutes as needed for chest pain.  30 tablet  0  . pantoprazole (PROTONIX) 40 MG tablet Take 1 tablet (40 mg total) by mouth daily.  60 tablet  2  . potassium chloride (K-DUR) 10 MEQ tablet Take 2 tablets (20 mEq total) by mouth 2 (two) times daily.  30 tablet  2  . simvastatin (ZOCOR) 20 MG tablet Take 1 tablet (20 mg total) by mouth at bedtime.  30 tablet  2  . [DISCONTINUED] fenofibrate (TRICOR) 145 MG tablet Take 1 tablet (145 mg total) by mouth daily.  30 tablet  2  . [DISCONTINUED] insulin detemir (LEVEMIR) 100 UNIT/ML injection Inject  60 Units into the skin at bedtime.  10 mL  1  . [DISCONTINUED] metoprolol (LOPRESSOR) 50 MG tablet Take 1 tablet (50 mg total) by mouth 2 (two) times daily.  60 tablet  2   No current facility-administered medications on file prior to visit.   No family history on file. History   Social History  . Marital Status: Divorced    Spouse Name: N/A    Number of Children: N/A  . Years of Education: N/A   Occupational History  . Not on file.   Social History Main Topics  . Smoking status: Never Smoker   . Smokeless tobacco: Never Used  . Alcohol Use: No  . Drug Use: No  . Sexually Active:    Other Topics Concern  . Not on file   Social History Narrative  . No narrative on file    Review of Systems  Constitutional: Negative for fever, chills, diaphoresis, activity change,  appetite change and fatigue.  HENT: Negative for ear pain, nosebleeds, congestion, facial swelling, rhinorrhea, neck pain, neck stiffness and ear discharge.   Eyes: Negative for pain, discharge, redness, itching and visual disturbance.  Respiratory: Negative for cough, choking, chest tightness, shortness of breath, wheezing and stridor.   Cardiovascular: Negative for chest pain, palpitations and leg swelling.  Gastrointestinal: Negative for abdominal distention.  Genitourinary: Negative for dysuria, urgency, frequency, hematuria, flank pain, decreased urine volume, difficulty urinating and dyspareunia.  Musculoskeletal: Negative for back pain, joint swelling, arthralgias and gait problem.  Neurological: Negative for dizziness, tremors, seizures, syncope, facial asymmetry, speech difficulty, weakness, light-headedness, numbness and headaches.  Hematological: Negative for adenopathy. Does not bruise/bleed easily.  Psychiatric/Behavioral: Negative for hallucinations, behavioral problems, confusion, dysphoric mood, decreased concentration and agitation.    Objective:   Filed Vitals:   03/09/13 1058  BP: 120/85  Pulse: 98  Temp: 98.1 F (36.7 C)    Physical Exam  Constitutional: Appears well-developed and well-nourished. No distress.  HENT: Normocephalic. External right and left ear normal. Oropharynx is clear and moist.  Eyes: Conjunctivae and EOM are normal. PERRLA, no scleral icterus.  Neck: Normal ROM. Neck supple. No JVD. No tracheal deviation. No thyromegaly.  CVS: RRR, S1/S2 +, no murmurs, no gallops, no carotid bruit.  Pulmonary: Effort and breath sounds normal, no stridor, rhonchi, wheezes, rales.  Abdominal: Soft. BS +,  no distension, tenderness, rebound or guarding.  Musculoskeletal: Normal range of motion. No edema and no tenderness.  Lymphadenopathy: No lymphadenopathy noted, cervical, inguinal. Neuro: Alert. Normal reflexes, muscle tone coordination. No cranial nerve  deficit. Skin: Skin is warm and dry. No rash noted. Not diaphoretic. No erythema. No pallor.  Psychiatric: Normal mood and affect. Behavior, judgment, thought content normal.   Lab Results  Component Value Date   WBC 5.4 10/05/2012   HGB 11.5* 10/05/2012   HCT 33.4* 10/05/2012   MCV 87.2 10/05/2012   PLT 211 10/05/2012   Lab Results  Component Value Date   CREATININE 0.75 10/05/2012   BUN 17 10/05/2012   NA 140 10/05/2012   K 3.6 10/05/2012   CL 102 10/05/2012   CO2 26 10/05/2012    Lab Results  Component Value Date   HGBA1C 7.0* 10/05/2012   Lipid Panel     Component Value Date/Time   CHOL 146 12/14/2012 1618   TRIG 129 12/14/2012 1618   HDL 47 12/14/2012 1618   CHOLHDL 3.1 12/14/2012 1618   VLDL 26 12/14/2012 1618   LDLCALC 73 12/14/2012 1618  Assessment and plan:   Patient Active Problem List   Diagnosis Date Noted  . Chest pain 10/05/2012  . GERD (gastroesophageal reflux disease) 08/30/2012  . Diastolic CHF, chronic 08/30/2012  . Hypertension   . Hypothyroidism   . Hyperlipemia   . Diabetes mellitus type II, uncontrolled        Peripheral edema Patient asked to increase her Lasix to 60 mg a day A new prescription has been provided We'll obtain a CMP panel She does have a history of chronic diastolic heart failure Advice to follow up in a week  Neck pain/cervical radiculopathy Refills for carboplatin, Robaxin, Percocet have been provided OxyIR has been discontinued Pain clinic referral has been provided

## 2013-03-10 LAB — COMPREHENSIVE METABOLIC PANEL
AST: 18 U/L (ref 0–37)
Albumin: 4.1 g/dL (ref 3.5–5.2)
Alkaline Phosphatase: 47 U/L (ref 39–117)
BUN: 13 mg/dL (ref 6–23)
Creat: 0.73 mg/dL (ref 0.50–1.10)
Glucose, Bld: 205 mg/dL — ABNORMAL HIGH (ref 70–99)
Potassium: 5 mEq/L (ref 3.5–5.3)
Total Bilirubin: 0.6 mg/dL (ref 0.3–1.2)

## 2013-03-16 ENCOUNTER — Ambulatory Visit: Payer: Self-pay

## 2013-03-17 ENCOUNTER — Ambulatory Visit: Payer: Self-pay

## 2013-03-21 DIAGNOSIS — N189 Chronic kidney disease, unspecified: Secondary | ICD-10-CM

## 2013-03-21 HISTORY — DX: Chronic kidney disease, unspecified: N18.9

## 2013-04-12 ENCOUNTER — Encounter (HOSPITAL_COMMUNITY): Payer: Self-pay | Admitting: Emergency Medicine

## 2013-04-12 ENCOUNTER — Inpatient Hospital Stay (HOSPITAL_COMMUNITY)
Admission: EM | Admit: 2013-04-12 | Discharge: 2013-04-15 | DRG: 683 | Disposition: A | Discharge status: Home / Self Care | Payer: No Typology Code available for payment source | Attending: Family Medicine | Admitting: Family Medicine

## 2013-04-12 DIAGNOSIS — IMO0002 Reserved for concepts with insufficient information to code with codable children: Secondary | ICD-10-CM

## 2013-04-12 DIAGNOSIS — N390 Urinary tract infection, site not specified: Secondary | ICD-10-CM

## 2013-04-12 DIAGNOSIS — N179 Acute kidney failure, unspecified: Principal | ICD-10-CM

## 2013-04-12 DIAGNOSIS — R0789 Other chest pain: Secondary | ICD-10-CM

## 2013-04-12 DIAGNOSIS — R079 Chest pain, unspecified: Secondary | ICD-10-CM

## 2013-04-12 DIAGNOSIS — E1165 Type 2 diabetes mellitus with hyperglycemia: Secondary | ICD-10-CM

## 2013-04-12 DIAGNOSIS — Z79899 Other long term (current) drug therapy: Secondary | ICD-10-CM

## 2013-04-12 DIAGNOSIS — E785 Hyperlipidemia, unspecified: Secondary | ICD-10-CM

## 2013-04-12 DIAGNOSIS — I509 Heart failure, unspecified: Secondary | ICD-10-CM | POA: Diagnosis present

## 2013-04-12 DIAGNOSIS — K625 Hemorrhage of anus and rectum: Secondary | ICD-10-CM | POA: Diagnosis present

## 2013-04-12 DIAGNOSIS — K219 Gastro-esophageal reflux disease without esophagitis: Secondary | ICD-10-CM

## 2013-04-12 DIAGNOSIS — I10 Essential (primary) hypertension: Secondary | ICD-10-CM

## 2013-04-12 DIAGNOSIS — G589 Mononeuropathy, unspecified: Secondary | ICD-10-CM | POA: Diagnosis present

## 2013-04-12 DIAGNOSIS — Z794 Long term (current) use of insulin: Secondary | ICD-10-CM

## 2013-04-12 DIAGNOSIS — I5032 Chronic diastolic (congestive) heart failure: Secondary | ICD-10-CM

## 2013-04-12 DIAGNOSIS — F411 Generalized anxiety disorder: Secondary | ICD-10-CM | POA: Diagnosis present

## 2013-04-12 DIAGNOSIS — IMO0001 Reserved for inherently not codable concepts without codable children: Secondary | ICD-10-CM | POA: Diagnosis present

## 2013-04-12 DIAGNOSIS — E039 Hypothyroidism, unspecified: Secondary | ICD-10-CM

## 2013-04-12 LAB — CBC WITH DIFFERENTIAL/PLATELET
Eosinophils Relative: 1 % (ref 0–5)
Hemoglobin: 11.9 g/dL — ABNORMAL LOW (ref 12.0–15.0)
Lymphocytes Relative: 31 % (ref 12–46)
Lymphs Abs: 2.2 10*3/uL (ref 0.7–4.0)
MCV: 87.4 fL (ref 78.0–100.0)
Monocytes Relative: 15 % — ABNORMAL HIGH (ref 3–12)
Neutrophils Relative %: 53 % (ref 43–77)
Platelets: 223 10*3/uL (ref 150–400)
RBC: 4.04 MIL/uL (ref 3.87–5.11)
WBC: 7.1 10*3/uL (ref 4.0–10.5)

## 2013-04-12 LAB — COMPREHENSIVE METABOLIC PANEL
ALT: 21 U/L (ref 0–35)
Alkaline Phosphatase: 57 U/L (ref 39–117)
BUN: 26 mg/dL — ABNORMAL HIGH (ref 6–23)
CO2: 30 mEq/L (ref 19–32)
GFR calc Af Amer: 40 mL/min — ABNORMAL LOW (ref >90)
GFR calc non Af Amer: 35 mL/min — ABNORMAL LOW (ref >90)
Glucose, Bld: 312 mg/dL — ABNORMAL HIGH (ref 70–99)
Potassium: 4.6 mEq/L (ref 3.5–5.1)
Sodium: 135 mEq/L (ref 135–145)

## 2013-04-12 LAB — GLUCOSE, CAPILLARY: Glucose-Capillary: 250 mg/dL — ABNORMAL HIGH (ref 70–99)

## 2013-04-12 LAB — URINALYSIS, ROUTINE W REFLEX MICROSCOPIC
Bilirubin Urine: NEGATIVE
Hgb urine dipstick: NEGATIVE
Specific Gravity, Urine: 1.012 (ref 1.005–1.030)
Urobilinogen, UA: 0.2 mg/dL (ref 0.0–1.0)
pH: 6 (ref 5.0–8.0)

## 2013-04-12 MED ORDER — INSULIN ASPART 100 UNIT/ML ~~LOC~~ SOLN
20.0000 [IU] | Freq: Three times a day (TID) | SUBCUTANEOUS | Status: DC
  Administered 2013-04-13 – 2013-04-15 (×8): 20 [IU] via SUBCUTANEOUS

## 2013-04-12 MED ORDER — DOCUSATE SODIUM 100 MG PO CAPS
100.0000 mg | ORAL_CAPSULE | Freq: Two times a day (BID) | ORAL | Status: DC
  Administered 2013-04-12 – 2013-04-15 (×6): 100 mg via ORAL
  Filled 2013-04-12 (×7): qty 1

## 2013-04-12 MED ORDER — PANTOPRAZOLE SODIUM 40 MG PO TBEC
40.0000 mg | DELAYED_RELEASE_TABLET | Freq: Every day | ORAL | Status: DC
  Administered 2013-04-13 – 2013-04-15 (×3): 40 mg via ORAL
  Filled 2013-04-12 (×3): qty 1

## 2013-04-12 MED ORDER — GABAPENTIN 300 MG PO CAPS
300.0000 mg | ORAL_CAPSULE | Freq: Four times a day (QID) | ORAL | Status: DC
  Administered 2013-04-12 – 2013-04-15 (×11): 300 mg via ORAL
  Filled 2013-04-12 (×13): qty 1

## 2013-04-12 MED ORDER — INSULIN ASPART 100 UNIT/ML ~~LOC~~ SOLN
0.0000 [IU] | Freq: Three times a day (TID) | SUBCUTANEOUS | Status: DC
  Administered 2013-04-13 – 2013-04-14 (×3): 3 [IU] via SUBCUTANEOUS
  Administered 2013-04-14: 5 [IU] via SUBCUTANEOUS
  Administered 2013-04-15 (×2): 3 [IU] via SUBCUTANEOUS

## 2013-04-12 MED ORDER — LEVOTHYROXINE SODIUM 100 MCG PO TABS
100.0000 ug | ORAL_TABLET | Freq: Every day | ORAL | Status: DC
  Administered 2013-04-13 – 2013-04-15 (×3): 100 ug via ORAL
  Filled 2013-04-12 (×4): qty 1

## 2013-04-12 MED ORDER — ONDANSETRON HCL 4 MG PO TABS
4.0000 mg | ORAL_TABLET | Freq: Four times a day (QID) | ORAL | Status: DC | PRN
  Administered 2013-04-13 – 2013-04-14 (×2): 4 mg via ORAL
  Filled 2013-04-12 (×2): qty 1

## 2013-04-12 MED ORDER — DEXTROSE 5 % IV SOLN
1.0000 g | INTRAVENOUS | Status: DC
  Administered 2013-04-13 – 2013-04-14 (×3): 1 g via INTRAVENOUS
  Filled 2013-04-12 (×4): qty 10

## 2013-04-12 MED ORDER — INSULIN GLARGINE 100 UNIT/ML ~~LOC~~ SOLN
60.0000 [IU] | Freq: Every day | SUBCUTANEOUS | Status: DC
  Administered 2013-04-13 – 2013-04-14 (×3): 60 [IU] via SUBCUTANEOUS
  Filled 2013-04-12 (×4): qty 0.6

## 2013-04-12 MED ORDER — INSULIN ASPART 100 UNIT/ML ~~LOC~~ SOLN
0.0000 [IU] | Freq: Every day | SUBCUTANEOUS | Status: DC
  Administered 2013-04-13 – 2013-04-14 (×2): 2 [IU] via SUBCUTANEOUS

## 2013-04-12 MED ORDER — METFORMIN HCL 500 MG PO TABS: 1000.0000 mg | ORAL_TABLET | Freq: Two times a day (BID) | ORAL | Status: DC

## 2013-04-12 MED ORDER — SIMVASTATIN 20 MG PO TABS
20.0000 mg | ORAL_TABLET | Freq: Every day | ORAL | Status: DC
  Administered 2013-04-12 – 2013-04-14 (×3): 20 mg via ORAL
  Filled 2013-04-12 (×4): qty 1

## 2013-04-12 MED ORDER — CLONIDINE HCL 0.1 MG PO TABS: 0.1000 mg | ORAL_TABLET | Freq: Two times a day (BID) | ORAL | Status: DC

## 2013-04-12 MED ORDER — AMLODIPINE BESYLATE 5 MG PO TABS
5.0000 mg | ORAL_TABLET | Freq: Every day | ORAL | Status: DC
  Administered 2013-04-13: 5 mg via ORAL
  Filled 2013-04-12: qty 1

## 2013-04-12 MED ORDER — SODIUM CHLORIDE 0.9 % IV SOLN
INTRAVENOUS | Status: DC
  Administered 2013-04-12: 23:00:00 via INTRAVENOUS

## 2013-04-12 MED ORDER — ONDANSETRON HCL 4 MG/2ML IJ SOLN: 4.0000 mg | Freq: Four times a day (QID) | INTRAMUSCULAR | Status: DC | PRN

## 2013-04-12 MED ORDER — METOPROLOL SUCCINATE ER 50 MG PO TB24
50.0000 mg | ORAL_TABLET | Freq: Every day | ORAL | Status: DC
  Administered 2013-04-13 – 2013-04-15 (×3): 50 mg via ORAL
  Filled 2013-04-12 (×3): qty 1

## 2013-04-12 MED ORDER — CLONIDINE HCL 0.1 MG PO TABS
0.1000 mg | ORAL_TABLET | Freq: Two times a day (BID) | ORAL | Status: DC
  Administered 2013-04-12 – 2013-04-13 (×2): 0.1 mg via ORAL
  Filled 2013-04-12 (×3): qty 1

## 2013-04-12 MED ORDER — POTASSIUM CHLORIDE ER 10 MEQ PO TBCR
20.0000 meq | EXTENDED_RELEASE_TABLET | Freq: Every day | ORAL | Status: DC
  Administered 2013-04-13 – 2013-04-14 (×2): 20 meq via ORAL
  Filled 2013-04-12 (×2): qty 2

## 2013-04-12 MED ORDER — DSS 100 MG PO CAPS: 100.0000 mg | ORAL_CAPSULE | Freq: Two times a day (BID) | ORAL | Status: DC

## 2013-04-12 NOTE — ED Notes (Signed)
The pt has 2 complaints rectal bleeding after having a bm  Med color for 2-3 weeks and she has  Swelling in her lower extremities.  She denies eating salt and she reports taking extra lasix with no improvement.  She has seen her doctor 2-3 days ago

## 2013-04-12 NOTE — ED Notes (Signed)
Report called to [id number] 

## 2013-04-12 NOTE — ED Notes (Signed)
The pt she is unable to void

## 2013-04-12 NOTE — Progress Notes (Signed)
Called for report room is ready for patient for admission.

## 2013-04-12 NOTE — ED Notes (Signed)
Asking for food given 

## 2013-04-12 NOTE — ED Provider Notes (Signed)
History    CSN: [id number] Arrival date & time 04/12/13  1721  First MD Initiated Contact with Patient 04/12/13 1804     Chief Complaint  Patient presents with  . Rectal Bleeding  . Leg Swelling   (Consider location/radiation/quality/duration/timing/severity/associated sxs/prior Treatment) HPI Patient presents with concern of rectal bleeding and concern of lower extremity edema. Rectal bleeding began approximately 3 days ago. Since onset she has had bleeding associated with bowel movements. There is no bleeding that is associated with non-defecation. Patient also complains of lower extremity edema.  This seems to have done several days ago, without clear precipitant. She has had prior episodes, though nonsustained, none with associated discomfort.  She describes burning sensation about the dorsum of each foot. During this illness, there has been no syncope, no lightheadedness, no chest pain, dyspnea. New medication, and travel.   Past Medical History  Diagnosis Date  . Diabetes mellitus type II, [name]    Past Surgical History  Procedure Laterality Date  . Cesarean section    . Knee surgery    . Thyroid surgery     History reviewed. No pertinent family history. History  Substance Use Topics  . Smoking status: Never Smoker   . Smokeless tobacco: Never Used  . Alcohol Use: No   OB History   Grav Para Term Preterm Abortions TAB SAB Ect Mult Living   4 3 2 1 1   1 2 6      Review of Systems  Constitutional:       Per HPI, otherwise negative  HENT:       Per HPI, otherwise negative  Respiratory:       Per HPI, otherwise negative  Cardiovascular:       Per HPI, otherwise negative  Gastrointestinal: Negative for vomiting.  Endocrine:       Negative aside from HPI  Genitourinary:       Neg aside from HPI    Musculoskeletal:       Per HPI, otherwise negative  Skin: Negative.   Neurological: Negative for syncope.    Allergies  Sulfa antibiotics and Amoxicillin-pot clavulanate  Home Medications   Current Outpatient Rx  Name  Route  Sig  Dispense  Refill  . amLODipine (NORVASC) 10 MG tablet   Oral   Take 1 tablet (10 mg total) by mouth daily.   30 tablet   2   . Choline Fenofibrate 135 MG capsule   Oral   Take 135 mg by mouth daily.         . cloNIDine (CATAPRES) 0.1 MG tablet   Oral   Take 0.1 mg by mouth 2 (two) times daily.         Tery Sanfilippo Sodium (DSS) 100 MG CAPS   Oral   Take 100 mg by mouth 2 (two) times daily.   30 each   3   . furosemide (LASIX) 20 MG tablet   Oral   Take 3 tablets (60 mg total) by mouth daily.   120 tablet   3   . gabapentin (NEURONTIN) 300 MG capsule   Oral   Take 1 capsule (300 mg total) by mouth 4 (four) times daily.   120 capsule   2   . insulin aspart (NOVOLOG) 100 UNIT/ML injection   Subcutaneous  Inject 20 Units into the skin 3 (three) times daily before meals.   1 vial   12   . insulin glargine (LANTUS) 100 UNIT/ML injection   Subcutaneous   Inject 0.6 mLs (60 Units total) into the skin at bedtime.   10 mL   12   . levothyroxine (SYNTHROID, LEVOTHROID) 100 MCG tablet   Oral   Take 1 tablet (100 mcg total) by mouth daily.   30 tablet   12   . lisinopril (PRINIVIL,ZESTRIL) 40 MG tablet   Oral   Take 1 tablet (40 mg total) by mouth daily.   30 tablet   12   . metFORMIN (GLUCOPHAGE) 1000 MG tablet   Oral   Take 1 tablet (1,000 mg total) by mouth 2 (two) times daily with a meal.   60 tablet   12   . methocarbamol (ROBAXIN) 500 MG tablet   Oral   Take 1 tablet (500 mg total) by mouth 4 (four) times daily.   30 tablet   2   . metoprolol succinate (TOPROL-XL) 50 MG 24 hr tablet   Oral   Take 50 mg by mouth daily.         . Multiple Vitamins-Minerals (EYE VITAMINS) CAPS   Oral   Take 1 capsule by  mouth daily.         . nitroGLYCERIN (NITROSTAT) 0.4 MG SL tablet   Sublingual   Place 0.4 mg under the tongue every 5 (five) minutes as needed for chest pain.         . pantoprazole (PROTONIX) 40 MG tablet   Oral   Take 1 tablet (40 mg total) by mouth daily.   60 tablet   2   . potassium chloride (K-DUR) 10 MEQ tablet   Oral   Take 20 mEq by mouth daily.         . simvastatin (ZOCOR) 20 MG tablet   Oral   Take 1 tablet (20 mg total) by mouth at bedtime.   30 tablet   2    BP 122/39  Pulse 99  Temp(Src) 98 F (36.7 C) (Oral)  Resp 14  SpO2 100% Physical Exam  Nursing note and vitals reviewed. Constitutional: She is oriented to person, place, and time. She appears well-developed and well-nourished. No distress.  HENT:  Head: Normocephalic and atraumatic.  Eyes: Conjunctivae and EOM are normal.  Cardiovascular: Normal rate and regular rhythm.   Pulmonary/Chest: Effort normal and breath sounds normal. No stridor. No respiratory distress.  Abdominal: She exhibits no distension.  Genitourinary: Rectal exam shows no external hemorrhoid, no mass, no tenderness and anal tone normal.  Guaiac pending, grossly negative stool  Musculoskeletal: She exhibits no edema.  Symmetric bilateral lower extremity edema, 1+  Neurological: She is alert and oriented to person, place, and time. No cranial nerve deficit.  Skin: Skin is warm and dry.  Psychiatric: She has a normal mood and affect.    ED Course  Procedures (including critical care time) Labs Reviewed  CBC WITH DIFFERENTIAL - Abnormal; Notable for the following:    Hemoglobin 11.9 (*)    HCT 35.3 (*)    Monocytes Relative 15 (*)    All other components within normal limits  URINALYSIS, ROUTINE W REFLEX MICROSCOPIC  COMPREHENSIVE METABOLIC PANEL  PRO B NATRIURETIC PEPTIDE  OCCULT BLOOD X 1 CARD TO LAB, STOOL   No results found. No diagnosis found.  Pulse ox 99% room air normal   MDM  Patient presents  with  concern of rectal bleeding as well as lower extremity edema and generalized complaints.  In regards to the rectal bleeding, there are no external hemorrhoids, her hemoglobin is increased, and she is grossly negative.  On admission the Hemoccult card is pending. Patient does have evidence of new kidney function decrease.  This may be due to recent initiation of Lasix.  Patient was admitted for further evaluation and management.   Gerhard Munch, MD 04/12/13 2149

## 2013-04-12 NOTE — ED Notes (Signed)
Pt c/o rectal bleeding x several weeks she thinks from hemorrhoids; pt sts swelling in legs x 3 days; pt sts taking lasix

## 2013-04-12 NOTE — ED Notes (Signed)
Warm blanket offered.  Turned down

## 2013-04-12 NOTE — Progress Notes (Signed)
PHARMACIST - PHYSICIAN COMMUNICATION DR:  CONCERNING:  METFORMIN SAFE ADMINISTRATION POLICY  RECOMMENDATION: Metformin has been placed on DISCONTINUE (rejected order) STATUS and should be reordered only after any of the conditions below are ruled out.  Current safety recommendations include avoiding metformin for a minimum of 48 hours after the patient's exposure to intravenous contrast media.  DESCRIPTION:  The Pharmacy Committee has adopted a policy that restricts the use of metformin in hospitalized patients until all the contraindications to administration have been ruled out. Specific contraindications are: []  Serum creatinine ? 1.5 for males [x]  Serum creatinine ? 1.4 for females  (current = 1.6) []  Shock, acute MI, sepsis, hypoxemia, dehydration []  Planned administration of intravenous iodinated contrast media []  Heart Failure patients with low EF []  Acute or chronic metabolic acidosis (including DKA)

## 2013-04-13 ENCOUNTER — Encounter (HOSPITAL_COMMUNITY): Payer: Self-pay | Admitting: *Deleted

## 2013-04-13 LAB — URINE CULTURE
Colony Count: NO GROWTH
Culture: NO GROWTH

## 2013-04-13 LAB — BASIC METABOLIC PANEL
BUN: 23 mg/dL (ref 6–23)
Chloride: 101 mEq/L (ref 96–112)
GFR calc Af Amer: 71 mL/min — ABNORMAL LOW (ref >90)
GFR calc non Af Amer: 61 mL/min — ABNORMAL LOW (ref >90)
Glucose, Bld: 197 mg/dL — ABNORMAL HIGH (ref 70–99)
Potassium: 4 mEq/L (ref 3.5–5.1)
Sodium: 138 mEq/L (ref 135–145)

## 2013-04-13 LAB — CBC
HCT: 31.3 % — ABNORMAL LOW (ref 36.0–46.0)
Hemoglobin: 10.8 g/dL — ABNORMAL LOW (ref 12.0–15.0)
MCH: 30 pg (ref 26.0–34.0)
MCHC: 34.5 g/dL (ref 30.0–36.0)
RBC: 3.6 MIL/uL — ABNORMAL LOW (ref 3.87–5.11)

## 2013-04-13 LAB — TSH: TSH: 2.868 u[IU]/mL (ref 0.350–4.500)

## 2013-04-13 LAB — GLUCOSE, CAPILLARY: Glucose-Capillary: 249 mg/dL — ABNORMAL HIGH (ref 70–99)

## 2013-04-13 MED ORDER — OXYCODONE-ACETAMINOPHEN 5-325 MG PO TABS
1.0000 | ORAL_TABLET | Freq: Once | ORAL | Status: AC
Start: 2013-04-13 — End: 2013-04-13
  Administered 2013-04-13: 1 via ORAL
  Filled 2013-04-13: qty 1

## 2013-04-13 MED ORDER — OXYCODONE-ACETAMINOPHEN 5-325 MG PO TABS
1.0000 | ORAL_TABLET | Freq: Four times a day (QID) | ORAL | Status: DC | PRN
  Administered 2013-04-13 – 2013-04-14 (×3): 1 via ORAL
  Filled 2013-04-13 (×3): qty 1

## 2013-04-13 NOTE — Progress Notes (Signed)
Pt sitting in bed tearful/upset regarding medical issues. Pt states she is "depressed that all this is going on." Sat with pt, offered therapeutic communication and touch. Will continue to monitor.

## 2013-04-13 NOTE — H&P (Signed)
[facility] History and Physical  Joliyah Lippens UJW:[id number] DOB: [birth year]    PCP:   Standley Dakins, MD   Chief Complaint: leg swelling and malaise.  HPI: Anita James is an 62 y.o. female with hx of DM on metformin, HTN on clonidine and Norvasc 10mg  per day, hyperlipidemia, hypothyroidism, peripheral neuropathy, presents to the ER with bilateral lower extremity edema.  She was found to have Cr of 1.6, a new rise from normal baseline a month ago.  In the ER, she has a small amount of BRBPR, with no diarrhea, black or bloody stool.  Hospitalist was asked to admit her for further evaluation and treatment. Apparently has was given Lasix about a couple of months ago.  She was a patient of [facility] before, but now has no PCP.  Rewiew of Systems:  Constitutional: Negative for malaise, fever and chills. No significant weight loss or weight gain Eyes: Negative for eye pain, redness and discharge, diplopia, visual changes, or flashes of light. ENMT: Negative for ear pain, hoarseness, nasal congestion, sinus pressure and sore throat. No headaches; tinnitus, drooling, or problem swallowing. Cardiovascular: Negative for chest pain, palpitations, diaphoresis, dyspnea and peripheral edema. ; No orthopnea, PND Respiratory: Negative for cough, hemoptysis, wheezing and stridor. No pleuritic chestpain. Gastrointestinal: Negative for nausea, vomiting, diarrhea, constipation, abdominal pain, melena, blood in stool, hematemesis, jaundice and rectal bleeding.    Genitourinary: Negative for frequency, dysuria, incontinence,flank pain and hematuria; Musculoskeletal: Negative for back pain and neck pain.   Skin: . Negative for pruritus, rash, abrasions, bruising and skin lesion.; ulcerations Neuro: Negative for headache, lightheadedness and neck stiffness. Negative for weakness, altered level of consciousness , altered mental status, extremity weakness, burning feet, involuntary movement, seizure and  syncope.  Psych: negative for anxiety, depression, insomnia, tearfulness, panic attacks, hallucinations, paranoia, suicidal or homicidal ideation   Past Medical History  Diagnosis Date  . Diabetes mellitus type II, [name]     Past Surgical History  Procedure Laterality Date  . Cesarean section    . Knee surgery    . Thyroid surgery      Medications:  HOME MEDS: Prior to Admission medications   Medication Sig Start Date End Date Taking? Authorizing Provider  amLODipine (NORVASC) 10 MG tablet Take 1 tablet (10 mg total) by mouth daily. 01/18/13  Yes Edsel Petrin, DO  Choline Fenofibrate 135 MG capsule Take 135 mg by mouth daily. 01/18/13  Yes Edsel Petrin, DO  cloNIDine (CATAPRES) 0.1 MG tablet Take 0.1 mg by mouth 2 (two) times daily. 01/18/13  Yes Edsel Petrin, DO  Docusate Sodium (DSS) 100 MG CAPS Take 100 mg by mouth 2 (two) times daily. 01/18/13  Yes Edsel Petrin, DO  furosemide (LASIX) 20 MG tablet Take 3 tablets (60 mg total) by mouth daily. 03/09/13  Yes Richarda Overlie, MD  gabapentin (NEURONTIN) 300 MG capsule Take 1 capsule (300 mg total) by mouth 4 (four) times daily. 03/09/13  Yes Alison Murray, MD  insulin aspart (NOVOLOG) 100 UNIT/ML injection Inject 20 Units into the skin 3 (three) times daily before meals. 01/18/13  Yes Edsel Petrin, DO  insulin glargine (LANTUS) 100 UNIT/ML injection Inject 0.6 mLs (60 Units total) into the skin at bedtime. 01/18/13  Yes Edsel Petrin, DO  levothyroxine (SYNTHROID, LEVOTHROID) 100  MCG tablet Take 1 tablet (100 mcg total) by mouth daily. 01/18/13  Yes Edsel Petrin, DO  lisinopril (PRINIVIL,ZESTRIL) 40 MG tablet Take 1 tablet (40 mg total) by mouth daily. 01/18/13  Yes Edsel Petrin, DO  metFORMIN (GLUCOPHAGE) 1000 MG tablet Take 1 tablet (1,000 mg total)  by mouth 2 (two) times daily with a meal. 01/18/13  Yes Edsel Petrin, DO  methocarbamol (ROBAXIN) 500 MG tablet Take 1 tablet (500 mg total) by mouth 4 (four) times daily. 03/09/13  Yes Alison Murray, MD  metoprolol succinate (TOPROL-XL) 50 MG 24 hr tablet Take 50 mg by mouth daily. 01/18/13  Yes Edsel Petrin, DO  Multiple Vitamins-Minerals (EYE VITAMINS) CAPS Take 1 capsule by mouth daily. 08/11/12  Yes Leroy Sea, MD  nitroGLYCERIN (NITROSTAT) 0.4 MG SL tablet Place 0.4 mg under the tongue every 5 (five) minutes as needed for chest pain. 10/06/12  Yes Leroy Sea, MD  pantoprazole (PROTONIX) 40 MG tablet Take 1 tablet (40 mg total) by mouth daily. 01/18/13  Yes Edsel Petrin, DO  potassium chloride (K-DUR) 10 MEQ tablet Take 20 mEq by mouth daily. 01/18/13  Yes Edsel Petrin, DO  simvastatin (ZOCOR) 20 MG tablet Take 1 tablet (20 mg total) by mouth at bedtime. 01/18/13  Yes Edsel Petrin, DO     Allergies:  Allergies  Allergen Reactions  . Sulfa Antibiotics Shortness Of Breath  . Amoxicillin-Pot Clavulanate     Combination of medications tear lining of stomach    Social History:   reports that she has never smoked. She has never used smokeless tobacco. She reports that she does not drink alcohol or use illicit drugs.  Family History: History reviewed. No pertinent family history.   Physical Exam: Filed Vitals:   04/12/13 1724 04/12/13 2223 04/12/13 2312  BP: 122/39 107/62 126/52  Pulse: 99 95 94  Temp: 98 F (36.7 C) 98.3 F (36.8 C) 97.5 F (36.4 C)  TempSrc: Oral Oral Oral  Resp: 14  16  Height:   5\' 6"  (1.676 m)  Weight:   121.2 kg (267 lb 3.2 oz)  SpO2: 100% 96% 95%   Blood pressure 126/52, pulse 94, temperature 97.5 F (36.4 C), temperature source Oral, resp. rate 16, height 5\' 6"  (1.676 m), weight 121.2 kg (267 lb 3.2 oz), SpO2 95.00%.  GEN:  Pleasant patient lying in the stretcher in no acute distress; cooperative with  exam. PSYCH:  alert and oriented x4; does not appear anxious or depressed; affect is appropriate. HEENT: Mucous membranes pink and anicteric; PERRLA; EOM intact; no cervical lymphadenopathy nor thyromegaly or carotid bruit; no JVD; There were no stridor. Neck is very supple. Breasts:: Not examined CHEST WALL: No tenderness CHEST: Normal respiration, clear to auscultation bilaterally.  HEART: Regular rate and rhythm.  There are no murmur, rub, or gallops.   BACK: No kyphosis or scoliosis; no CVA tenderness ABDOMEN: soft and non-tender; no masses, no organomegaly, normal abdominal bowel sounds; no pannus; no intertriginous candida. There is no rebound and no distention. Rectal Exam: Not done EXTREMITIES: No bone or joint deformity; age-appropriate arthropathy of the hands and knees; ankle edema is present bilaterally.  no ulcerations.  There is no calf tenderness. Genitalia: not examined PULSES: 2+ and symmetric SKIN: Normal hydration no rash or ulceration CNS: Cranial nerves 2-12 grossly intact no focal lateralizing neurologic deficit.  Speech is fluent; uvula elevated with phonation, facial symmetry and tongue midline. DTR are normal bilaterally, cerebella exam  is intact, barbinski is negative and strengths are equaled bilaterally.  No sensory loss.   Labs on Admission:  Basic Metabolic Panel:  Recent Labs Lab 04/12/13 1759  NA 135  K 4.6  CL 93*  CO2 30  GLUCOSE 312*  BUN 26*  CREATININE 1.58*  CALCIUM 9.4   Liver Function Tests:  Recent Labs Lab 04/12/13 1759  AST 19  ALT 21  ALKPHOS 57  BILITOT 0.3  PROT 6.9  ALBUMIN 3.7   No results found for this basename: LIPASE, AMYLASE,  in the last 168 hours No results found for this basename: AMMONIA,  in the last 168 hours CBC:  Recent Labs Lab 04/12/13 1759  WBC 7.1  NEUTROABS 3.8  HGB 11.9*  HCT 35.3*  MCV 87.4  PLT 223   Cardiac Enzymes: No results found for this basename: CKTOTAL, CKMB, CKMBINDEX, TROPONINI,   in the last 168 hours  CBG:  Recent Labs Lab 04/12/13 2221 04/12/13 2313  GLUCAP 263* 250*     Radiological Exams on Admission: No results found.  Assessment/Plan Present on Admission:  . AKI (acute kidney injury) . UTI (lower urinary tract infection) . Hypothyroidism . Hypertension . Hyperlipemia . Diabetes mellitus type II, uncontrolled  PLAN:  Will admit her for mild volume depletion as evidence with elevated Cr.  Will give IVF and follow her Cr carefully.  Her metformin was held for now.  The lower extremity edema is most likely because of Norvasc and clonidine.  I see norvasc at dose 10mg  per day causing pedal edema, but not less.  Will give her 5mg  per day.  Her clonidine can do this too, but will continue for now.  I have placed her on SSI, but suspect she can resume her metformin once her Cr normalizes.  She also has hypothyroid, which can also cause pretibial edema, and will have her TSH checked. She is stable, full code, and will be [facility] to [facility] service.  She also has a UA positive for UTI, and will get IV Rocephin.  Thank you for allowing me to partake in the care of this nice patient.  Other plans as per orders.  Code Status:FULL CODE.   Houston Siren, MD. [facility] Pager [phone] 7pm to 7am.  04/13/2013, 12:07 AM

## 2013-04-13 NOTE — Progress Notes (Signed)
Chaplain visited with pt at ED B16 in response to pt's request for prayer. Pt states "she could not tolerate the excruciating pain in her feet." Chaplain listened empathically to her story, shared words of comfort and encouragement, and prayed for her. Pt was later admitted at 6E08. Pt thanked chaplain for prayer and presence. Chaplain will refer to the Unit Chaplain for follow up. Kelle Darting [phone]  04/12/13 2250  Clinical Encounter Type  Visited With Patient  Visit Type Initial;Spiritual support;ED  Referral From Patient  Spiritual Encounters  Spiritual Needs Prayer;Emotional  Stress Factors  Patient Stress Factors Other (Comment) (Excruciating foot pain)

## 2013-04-13 NOTE — Progress Notes (Signed)
Patient seen and evaluated earlier this Am by my associate. Please refer to his H and P for further details regarding Assessment and Plan.  Lower extremity edema BL - agree that this is most likely due to side effects of antihypertensive medication amlodipine and clonidine. - Recommended patient elevating legs above heart - Patient has h/o CHF but is breathing comfortably on room air  ARF - Most likely prerenal as improved after saline lock - Will discontinue fluids given improvement in S creatinine - hold metformin at this juncture  DM - agree with holding metformin given recently elevated creatinine - SSI - diabetic diet.  UTI - continue rocephin - f/u on urine culture and adjust abx pending sensitivities.  History of CHF (diastolic) - compensated - no rhales on PE - saline lock IVF's  Will reassess next am.  Penny Pia

## 2013-04-13 NOTE — Care Management Note (Signed)
Met with pt to discuss followup care. Pt states that she is unhappy with care provided at [facility] and [facility] and [facility]. This CM informed pt that we can provide list of other clinics in the [place] area that pt may want to explore. Pt MD, Dr Hollie Beach inform of pt discontent with previous health care providers. CM has no further options to offer this pt. Pt states that she has an orange card and gets her medications at the [facility].  Johny Shock RN MPH Case Manager [phone]

## 2013-04-13 NOTE — Progress Notes (Signed)
Utilization review completed. Abagale Boulos, RN, BSN. 

## 2013-04-13 NOTE — Progress Notes (Signed)
Utilization review completed. Dellene Mcgroarty, RN, BSN. 

## 2013-04-14 ENCOUNTER — Inpatient Hospital Stay (HOSPITAL_COMMUNITY): Payer: No Typology Code available for payment source

## 2013-04-14 DIAGNOSIS — E039 Hypothyroidism, unspecified: Secondary | ICD-10-CM

## 2013-04-14 DIAGNOSIS — E785 Hyperlipidemia, unspecified: Secondary | ICD-10-CM

## 2013-04-14 DIAGNOSIS — N390 Urinary tract infection, site not specified: Secondary | ICD-10-CM

## 2013-04-14 DIAGNOSIS — I5032 Chronic diastolic (congestive) heart failure: Secondary | ICD-10-CM

## 2013-04-14 DIAGNOSIS — R0789 Other chest pain: Secondary | ICD-10-CM

## 2013-04-14 DIAGNOSIS — I509 Heart failure, unspecified: Secondary | ICD-10-CM

## 2013-04-14 DIAGNOSIS — K219 Gastro-esophageal reflux disease without esophagitis: Secondary | ICD-10-CM

## 2013-04-14 LAB — TROPONIN I: Troponin I: <0.3 ng/mL (ref <0.30)

## 2013-04-14 LAB — GLUCOSE, CAPILLARY
Glucose-Capillary: 166 mg/dL — ABNORMAL HIGH (ref 70–99)
Glucose-Capillary: 183 mg/dL — ABNORMAL HIGH (ref 70–99)

## 2013-04-14 MED ORDER — POLYETHYLENE GLYCOL 3350 17 G PO PACK
17.0000 g | PACK | Freq: Every day | ORAL | Status: DC
  Administered 2013-04-15 (×2): 17 g via ORAL
  Filled 2013-04-14 (×2): qty 1

## 2013-04-14 MED ORDER — OLOPATADINE HCL 0.1 % OP SOLN
1.0000 [drp] | Freq: Two times a day (BID) | OPHTHALMIC | Status: DC
  Administered 2013-04-14 – 2013-04-15 (×2): 1 [drp] via OPHTHALMIC
  Filled 2013-04-14: qty 5

## 2013-04-14 MED ORDER — OXYCODONE-ACETAMINOPHEN 5-325 MG PO TABS
1.0000 | ORAL_TABLET | Freq: Four times a day (QID) | ORAL | Status: DC | PRN
  Administered 2013-04-14 – 2013-04-15 (×4): 2 via ORAL
  Filled 2013-04-14 (×4): qty 2

## 2013-04-14 NOTE — Progress Notes (Signed)
[facility] PROGRESS NOTE  Anita James WUJ:[id number] DOB: [birth year] DOA: 04/12/2013 PCP: Standley Dakins, MD  Assessment/Plan: Lower extremity edema BL  - agree that this is most likely due to side effects of antihypertensive medication amlodipine and clonidine.  - Recommended patient elevating legs above heart  - Patient has h/o CHF but is breathing comfortably on room air   Chest discomfort - Troponins q 6 hours x 3. Troponins negative - atypical for cardiac origin. - EKG - Continue trial of protonix.  ARF  - Most likely prerenal as improved after saline lock  - Will discontinue fluids given improvement in S creatinine  - hold metformin at this juncture  - hold lasix at this point.  DM  - agree with holding metformin given recently elevated creatinine  - SSI  - diabetic diet.   UTI  - continue rocephin  - f/u on urine culture and adjust abx pending sensitivities.   History of CHF (diastolic)  - compensated  - no rhales on PE  - saline lock IVF's  - lasix on hold due to ARF   Code Status: full Family Communication: no family at bedside  Disposition Plan: Pending improvement in condition. Likely d/c in 1-2 days   Consultants:  None  Procedures:  none  Antibiotics:  Ceftriaxone   HPI/Subjective: No new complaints. No acute issues overnight.  Objective: Filed Vitals:   04/13/13 2123 04/14/13 0557 04/14/13 0929 04/14/13 1410  BP: 113/54 105/47 126/68 119/58  Pulse: 78 80 81 77  Temp: 97.8 F (36.6 C) 98.3 F (36.8 C) 98.4 F (36.9 C) 98.7 F (37.1 C)  TempSrc: Oral Oral Oral Oral  Resp: 20 20 18 18   Height: 5\' 6"  (1.676 m)     Weight: 123.7 kg (272 lb 11.3 oz)     SpO2: 96% 93% 94% 98%    Intake/Output Summary (Last 24 hours) at 04/14/13 1612 Last data filed at 04/14/13 0600  Gross per 24 hour  Intake    410 ml  Output      0 ml  Net    410 ml   Filed Weights   04/12/13 2312 04/13/13 2123  Weight: 121.2 kg (267 lb 3.2 oz) 123.7  kg (272 lb 11.3 oz)    Exam:   General:  Pt in NAD, Alert and Awake  Cardiovascular: RRR, no MRG  Respiratory: CTA BL, no wheezes  Abdomen: soft, NT, ND  Musculoskeletal: no cyanosis or clubbing.   Data Reviewed: Basic Metabolic Panel:  Recent Labs Lab 04/12/13 1759 04/13/13 0536  NA 135 138  K 4.6 4.0  CL 93* 101  CO2 30 24  GLUCOSE 312* 197*  BUN 26* 23  CREATININE 1.58* 0.99  CALCIUM 9.4 8.8   Liver Function Tests:  Recent Labs Lab 04/12/13 1759  AST 19  ALT 21  ALKPHOS 57  BILITOT 0.3  PROT 6.9  ALBUMIN 3.7   No results found for this basename: LIPASE, AMYLASE,  in the last 168 hours No results found for this basename: AMMONIA,  in the last 168 hours CBC:  Recent Labs Lab 04/12/13 1759 04/13/13 0536  WBC 7.1 4.5  NEUTROABS 3.8  --   HGB 11.9* 10.8*  HCT 35.3* 31.3*  MCV 87.4 86.9  PLT 223 183   Cardiac Enzymes:  Recent Labs Lab 04/14/13 1158  TROPONINI <0.30   BNP (last 3 results)  Recent Labs  04/12/13 1955  PROBNP 14.2   CBG:  Recent Labs Lab 04/13/13 1217 04/13/13 1804  04/13/13 2122 04/14/13 0829 04/14/13 1126  GLUCAP 154* 90 166* 156* 225*    Recent Results (from the past 240 hour(s))  URINE CULTURE     Status: None   Collection Time    04/12/13  8:10 PM      Result Value Range Status   Specimen Description URINE, RANDOM   Final   Special Requests NONE   Final   Culture  Setup Time 04/13/2013 02:32   Final   Colony Count NO GROWTH   Final   Culture NO GROWTH   Final   Report Status 04/13/2013 FINAL   Final     Studies: No results found.  Scheduled Meds: . cefTRIAXone (ROCEPHIN)  IV  1 g Intravenous Q24H  . docusate sodium  100 mg Oral BID  . gabapentin  300 mg Oral QID  . insulin aspart  0-15 Units Subcutaneous TID WC  . insulin aspart  0-5 Units Subcutaneous QHS  . insulin aspart  20 Units Subcutaneous TID AC  . insulin glargine  60 Units Subcutaneous QHS  . levothyroxine  100 mcg Oral QAC breakfast   . metoprolol succinate  50 mg Oral Daily  . olopatadine  1 drop Right Eye BID  . pantoprazole  40 mg Oral Daily  . simvastatin  20 mg Oral QHS   Continuous Infusions:   Principal Problem:   AKI (acute kidney injury) Active Problems:   Hypertension   Hypothyroidism   Hyperlipemia   Diabetes mellitus type II, uncontrolled   UTI (lower urinary tract infection)    Time spent: > 35 minutes    Penny Pia  [facility] Pager [phone]. If 7PM-7AM, please contact night-coverage at [email or web address], password [email or web address] 04/14/2013, 4:12 PM  LOS: 2 days

## 2013-04-14 NOTE — Progress Notes (Signed)
Explained to patient about the purpose of EKG,she sid yes and later when the n.tech went to her room,to do the EKG,PATIENT REFUSED.

## 2013-04-15 LAB — TROPONIN I
Troponin I: <0.3 ng/mL (ref <0.30)
Troponin I: <0.3 ng/mL (ref <0.30)

## 2013-04-15 LAB — GLUCOSE, CAPILLARY: Glucose-Capillary: 167 mg/dL — ABNORMAL HIGH (ref 70–99)

## 2013-04-15 MED ORDER — OXYCODONE-ACETAMINOPHEN 5-325 MG PO TABS: 1.0000 | ORAL_TABLET | Freq: Three times a day (TID) | ORAL | Status: DC | PRN

## 2013-04-15 NOTE — Progress Notes (Signed)
Discharge home via wheelchair.  

## 2013-04-15 NOTE — Discharge Summary (Signed)
Physician Discharge Summary  Anita James ZOX:[id number] DOB: 1950-09-22 DOA: 04/12/2013  PCP: Standley Dakins, MD  Admit date: 04/12/2013 Discharge date: 04/15/2013  Time spent: > 35 minutes  Recommendations for Outpatient Follow-up:  1. Please follow up with blood sugar levels and adjust hyperglycemic agents 2. Also be sure to follow up with creatinine levels 3. Held Amlodipine and clonidine due to lower leg edema.  Please recheck blood pressures and adjust medications as indicated.  Discharge Diagnoses:  Principal Problem:   AKI (acute kidney injury) Active Problems:   Hypertension   Hypothyroidism   Hyperlipemia   Diabetes mellitus type II, uncontrolled   UTI (lower urinary tract infection)   Chest discomfort   Discharge Condition: stable  Diet recommendation: Heart healthy/diabetic diet.  Filed Weights   04/12/13 2312 04/13/13 2123 04/14/13 2211  Weight: 121.2 kg (267 lb 3.2 oz) 123.7 kg (272 lb 11.3 oz) 125.7 kg (277 lb 1.9 oz)    History of present illness:  Patient is a 62 y/o CF with h/o htn on clonidine and norvasc, DM on metformin and insulin, hypothyroidism, and grade 1 diastolic HF that presented to the hospital complaining of leg swelling and malaise.    Hospital Course:   Lower extremity edema BL  - agree that this is most likely due to side effects of antihypertensive medication amlodipine and clonidine.  - Recommended patient elevating legs above heart  - Patient has h/o CHF but is breathing comfortably on room air. - Improved after discontinuing clonidine and amlodipine   Chest discomfort  - Troponins q 6 hours x 3. Troponins negative  - atypical for cardiac origin.  - EKG (refused by patient)  - Continue trial of protonix.  - Recommend further evaluation as outpatient. [name] reported on day of discharge.  ARF  - Most likely prerenal as improved after saline lock  - Will discontinue fluids given improvement in S creatinine  -  Resolved  DM  - Metformin held while patient in house. Of note patient's creatinine was > 1.4 initially. Discussed with patient and currently she does not want to get back on the metformin.   - Will continue patient's insulin regimen - diabetic diet.   UTI  - Received 3 days of antibiotics - No growth on urine culture.  History of CHF (diastolic)  - compensated  - Continue B blocker and lisinopril - Given resolution of ARF will resume patient's lasix regimen   Procedures:  None  Consultations:  None  Discharge Exam: Filed Vitals:   04/14/13 1410 04/14/13 1604 04/14/13 2211 04/15/13 0455  BP: 119/58 114/59 121/53 139/79  Pulse: 77 72 92 89  Temp: 98.7 F (37.1 C) 98.5 F (36.9 C) 98.8 F (37.1 C) 98.7 F (37.1 C)  TempSrc: Oral Oral Oral Oral  Resp: 18 18 20 19   Height:   5\' 6"  (1.676 m)   Weight:   125.7 kg (277 lb 1.9 oz)   SpO2: 98% 99% 95% 93%    General: Pt in NAD, Alert and Awake Cardiovascular: RRR, no MRG Respiratory: CTA BL, no wheezes  Discharge Instructions  Discharge Orders   Future Orders Complete By Expires     Call MD for:  difficulty breathing, headache or visual disturbances  As directed     Call MD for:  temperature >100.4  As directed     Diet - low sodium heart healthy  As directed     Discharge instructions  As directed     Comments:  Please be sure to follow up with your primary care physician in 1-2 weeks or sooner should any new conditions arise.  You are to adhere to a low sodium and carb modified diet.  Discuss an exercise regimen with your pcp    Increase activity slowly  As directed         Medication List    STOP taking these medications       amLODipine 10 MG tablet  Commonly known as:  NORVASC     cloNIDine 0.1 MG tablet  Commonly known as:  CATAPRES     metFORMIN 1000 MG tablet  Commonly known as:  GLUCOPHAGE      TAKE these medications       Choline Fenofibrate 135 MG capsule  Take 135 mg by mouth  daily.     DSS 100 MG Caps  Take 100 mg by mouth 2 (two) times daily.     EYE VITAMINS Caps  Take 1 capsule by mouth daily.     furosemide 20 MG tablet  Commonly known as:  LASIX  Take 3 tablets (60 mg total) by mouth daily.     gabapentin 300 MG capsule  Commonly known as:  NEURONTIN  Take 1 capsule (300 mg total) by mouth 4 (four) times daily.     insulin aspart 100 UNIT/ML injection  Commonly known as:  novoLOG  Inject 20 Units into the skin 3 (three) times daily before meals.     insulin glargine 100 UNIT/ML injection  Commonly known as:  LANTUS  Inject 0.6 mLs (60 Units total) into the skin at bedtime.     levothyroxine 100 MCG tablet  Commonly known as:  SYNTHROID, LEVOTHROID  Take 1 tablet (100 mcg total) by mouth daily.     lisinopril 40 MG tablet  Commonly known as:  PRINIVIL,ZESTRIL  Take 1 tablet (40 mg total) by mouth daily.     methocarbamol 500 MG tablet  Commonly known as:  ROBAXIN  Take 1 tablet (500 mg total) by mouth 4 (four) times daily.     metoprolol succinate 50 MG 24 hr tablet  Commonly known as:  TOPROL-XL  Take 50 mg by mouth daily.     nitroGLYCERIN 0.4 MG SL tablet  Commonly known as:  NITROSTAT  Place 0.4 mg under the tongue every 5 (five) minutes as needed for chest pain.     oxyCODONE-acetaminophen 5-325 MG per tablet  Commonly known as:  PERCOCET/ROXICET  Take 1 tablet by mouth every 8 (eight) hours as needed for pain.     pantoprazole 40 MG tablet  Commonly known as:  PROTONIX  Take 1 tablet (40 mg total) by mouth daily.     potassium chloride 10 MEQ tablet  Commonly known as:  K-DUR  Take 20 mEq by mouth daily.     simvastatin 20 MG tablet  Commonly known as:  ZOCOR  Take 1 tablet (20 mg total) by mouth at bedtime.       Allergies  Allergen Reactions  . Sulfa Antibiotics Shortness Of Breath  . Amoxicillin-Pot Clavulanate     Combination of medications tear lining of stomach      The results of significant  diagnostics from this hospitalization (including imaging, microbiology, ancillary and laboratory) are listed below for reference.    Significant Diagnostic Studies: Dg Chest 2 View  04/14/2013   *RADIOLOGY REPORT*  Clinical Data: Chest pain, shortness of breath  CHEST - 2 VIEW  Comparison: October 05, 2012.  Findings: Cardiomediastinal silhouette appears normal.  No acute pulmonary disease is noted.  Bony thorax is intact.  IMPRESSION: No acute cardiopulmonary abnormality seen.   Original Report Authenticated By: Lupita Raider.,  M.D.    Microbiology: Recent Results (from the past 240 hour(s))  URINE CULTURE     Status: None   Collection Time    04/12/13  8:10 PM      Result Value Range Status   Specimen Description URINE, RANDOM   Final   Special Requests NONE   Final   Culture  Setup Time 04/13/2013 02:32   Final   Colony Count NO GROWTH   Final   Culture NO GROWTH   Final   Report Status 04/13/2013 FINAL   Final     Labs: Basic Metabolic Panel:  Recent Labs Lab 04/12/13 1759 04/13/13 0536  NA 135 138  K 4.6 4.0  CL 93* 101  CO2 30 24  GLUCOSE 312* 197*  BUN 26* 23  CREATININE 1.58* 0.99  CALCIUM 9.4 8.8   Liver Function Tests:  Recent Labs Lab 04/12/13 1759  AST 19  ALT 21  ALKPHOS 57  BILITOT 0.3  PROT 6.9  ALBUMIN 3.7   No results found for this basename: LIPASE, AMYLASE,  in the last 168 hours No results found for this basename: AMMONIA,  in the last 168 hours CBC:  Recent Labs Lab 04/12/13 1759 04/13/13 0536  WBC 7.1 4.5  NEUTROABS 3.8  --   HGB 11.9* 10.8*  HCT 35.3* 31.3*  MCV 87.4 86.9  PLT 223 183   Cardiac Enzymes:  Recent Labs Lab 04/14/13 1158 04/14/13 1747 04/14/13 2243 04/15/13 0440  TROPONINI <0.30 <0.30 <0.30 <0.30   BNP: BNP (last 3 results)  Recent Labs  04/12/13 1955  PROBNP 14.2   CBG:  Recent Labs Lab 04/14/13 1126 04/14/13 1704 04/14/13 2201 04/14/13 2334 04/15/13 0736  GLUCAP 225* 95 204* 183* 169*        Signed:  Penny Pia  [facility] 04/15/2013, 11:14 AM

## 2013-04-15 NOTE — Progress Notes (Signed)
Upon attempting to educate pt on discharge, pt became agitated anxious. Voiced multiple concerns including medications and lab work. Explained to pt discharge instructions and plan for follow up. Pt agitation became more escalated, calling staff rude demanding questions to be answered once again. Vss. No complaints of acute pain, or SOB. Notified Aquilla Solian CN.

## 2013-04-17 ENCOUNTER — Ambulatory Visit: Payer: No Typology Code available for payment source | Attending: Family Medicine | Admitting: Family Medicine

## 2013-04-17 VITALS — BP 182/95 | HR 88 | Resp 17 | Wt 262.8 lb

## 2013-04-17 DIAGNOSIS — K219 Gastro-esophageal reflux disease without esophagitis: Secondary | ICD-10-CM

## 2013-04-17 DIAGNOSIS — E039 Hypothyroidism, unspecified: Secondary | ICD-10-CM

## 2013-04-17 DIAGNOSIS — I5032 Chronic diastolic (congestive) heart failure: Secondary | ICD-10-CM

## 2013-04-17 DIAGNOSIS — I10 Essential (primary) hypertension: Secondary | ICD-10-CM

## 2013-04-17 DIAGNOSIS — IMO0002 Reserved for concepts with insufficient information to code with codable children: Secondary | ICD-10-CM

## 2013-04-17 DIAGNOSIS — E1165 Type 2 diabetes mellitus with hyperglycemia: Secondary | ICD-10-CM

## 2013-04-17 DIAGNOSIS — I509 Heart failure, unspecified: Secondary | ICD-10-CM

## 2013-04-17 DIAGNOSIS — E785 Hyperlipidemia, unspecified: Secondary | ICD-10-CM

## 2013-04-17 MED ORDER — METOPROLOL SUCCINATE ER 50 MG PO TB24: 50.0000 mg | ORAL_TABLET | Freq: Every day | ORAL | Status: DC

## 2013-04-17 MED ORDER — PANTOPRAZOLE SODIUM 40 MG PO TBEC: 40.0000 mg | DELAYED_RELEASE_TABLET | Freq: Every day | ORAL | Status: DC

## 2013-04-17 MED ORDER — GABAPENTIN 300 MG PO CAPS: 300.0000 mg | ORAL_CAPSULE | Freq: Four times a day (QID) | ORAL | Status: DC

## 2013-04-17 MED ORDER — SIMVASTATIN 20 MG PO TABS: 20.0000 mg | ORAL_TABLET | Freq: Every day | ORAL | Status: DC

## 2013-04-17 MED ORDER — LISINOPRIL 40 MG PO TABS: 40.0000 mg | ORAL_TABLET | Freq: Every day | ORAL | Status: DC

## 2013-04-17 MED ORDER — LEVOTHYROXINE SODIUM 100 MCG PO TABS: 100.0000 ug | ORAL_TABLET | Freq: Every day | ORAL | Status: DC

## 2013-04-17 NOTE — Progress Notes (Signed)
[name], female   DOB: 1950/11/15, 62 y.o.   MRN: [id number]  CC:  Follow up from hospital   HPI: Pt is presenting to follow up from recent hospitalization.  Pt says that she has not been taking metoprolol but has been taking her lisinopril. She was taken off metformin because of concern of renal insufficiency.  Her creatinine did improve to normal before being discharged from hospital. Pt says that she is afraid to restart metformin at this time. Pt says that she needs refills of her medications and a letter saying that she is fit to return to normal activities with no restrictions.   Allergies  Allergen Reactions  . Sulfa Antibiotics Shortness Of Breath  . Amoxicillin-Pot Clavulanate     Combination of medications tear lining of stomach   Past Medical History  Diagnosis Date  . Diabetes mellitus type II, [name]    Current Outpatient Prescriptions on File Prior to Visit  Medication Sig Dispense Refill  . Choline Fenofibrate 135 MG capsule Take 135 mg by mouth daily.      Tery Sanfilippo Sodium (DSS) 100 MG CAPS Take 100 mg by mouth 2 (two) times daily.  30 each  3  . insulin aspart (NOVOLOG) 100 UNIT/ML injection Inject 20 Units into the skin 3 (three) times daily before meals.  1 vial  12  . insulin glargine (LANTUS) 100 UNIT/ML injection Inject 0.6 mLs (60 Units total) into the skin at bedtime.  10 mL  12  . methocarbamol (ROBAXIN) 500 MG tablet Take 1 tablet (500 mg total) by mouth 4 (four) times daily.  30 tablet  2  . Multiple Vitamins-Minerals (EYE VITAMINS) CAPS Take 1 capsule by mouth daily.      . nitroGLYCERIN (NITROSTAT) 0.4 MG SL tablet Place 0.4 mg under the tongue every 5 (five) minutes as needed for chest pain.      Marland Kitchen oxyCODONE-acetaminophen (PERCOCET/ROXICET) 5-325 MG per tablet Take 1 tablet by mouth every 8 (eight) hours  as needed for pain.  15 tablet  0  . [DISCONTINUED] fenofibrate (TRICOR) 145 MG tablet Take 1 tablet (145 mg total) by mouth daily.  30 tablet  2  . [DISCONTINUED] insulin detemir (LEVEMIR) 100 UNIT/ML injection Inject 60 Units into the skin at bedtime.  10 mL  1  . [DISCONTINUED] metoprolol (LOPRESSOR) 50 MG tablet Take 1 tablet (50 mg total) by mouth 2 (two) times daily.  60 tablet  2   No current facility-administered medications on file prior to visit.   History reviewed. No pertinent family history. History   Social History  . Marital Status: Divorced    Spouse Name: N/A    Number of Children: N/A  . Years of Education: N/A   Occupational History  . Not on file.   Social History Main Topics  . Smoking status: Never Smoker   . Smokeless tobacco: Never Used  . Alcohol Use: No  . Drug Use: No  . Sexually Active:    Other Topics Concern  . Not on file   Social History Narrative  . No narrative on file    Review of Systems  Constitutional: Negative for fever, chills, diaphoresis, activity change, appetite change and fatigue.  HENT: Negative for ear pain, nosebleeds, congestion, facial swelling, rhinorrhea, neck pain, neck stiffness  and ear discharge.   Eyes: Negative for pain, discharge, redness, itching and visual disturbance.  Respiratory: Negative for cough, choking, chest tightness, shortness of breath, wheezing and stridor.   Cardiovascular: Negative for chest pain, palpitations and leg swelling.  Gastrointestinal: Negative for abdominal distention.  Genitourinary: Negative for dysuria, urgency, frequency, hematuria, flank pain, decreased urine volume, difficulty urinating and dyspareunia.  Musculoskeletal: Negative for back pain, joint swelling, arthralgias and gait problem.  Neurological: Negative for dizziness, tremors, seizures, syncope, facial asymmetry, speech difficulty, weakness, light-headedness, numbness and headaches.  Hematological: Negative for adenopathy.  Does not bruise/bleed easily.  Psychiatric/Behavioral: Negative for hallucinations, behavioral problems, confusion, dysphoric mood, decreased concentration and agitation.    Objective:   Filed Vitals:   04/17/13 1606  BP: 182/95  Pulse: 88  Resp: 17    Physical Exam  Constitutional: Appears well-developed and well-nourished. No distress.  HENT: Normocephalic. External right and left ear normal. Oropharynx is clear and moist.  Eyes: Conjunctivae and EOM are normal. PERRLA, no scleral icterus.  Neck: Normal ROM. Neck supple. No JVD. No tracheal deviation. No thyromegaly.  CVS: RRR, S1/S2 +, no murmurs, no gallops, no carotid bruit.  Pulmonary: Effort and breath sounds normal, no stridor, rhonchi, wheezes, rales.  Abdominal: Soft. BS +,  no distension, tenderness, rebound or guarding.  Musculoskeletal: Normal range of motion. No edema and no tenderness.  Lymphadenopathy: No lymphadenopathy noted, cervical, inguinal. Neuro: Alert. Normal reflexes, muscle tone coordination. No cranial nerve deficit. Skin: Skin is warm and dry. No rash noted. Not diaphoretic. No erythema. No pallor.  Psychiatric: Pt crying and upset at times.   Lab Results  Component Value Date   WBC 4.5 04/13/2013   HGB 10.8* 04/13/2013   HCT 31.3* 04/13/2013   MCV 86.9 04/13/2013   PLT 183 04/13/2013   Lab Results  Component Value Date   CREATININE 0.99 04/13/2013   BUN 23 04/13/2013   NA 138 04/13/2013   K 4.0 04/13/2013   CL 101 04/13/2013   CO2 24 04/13/2013    Lab Results  Component Value Date   HGBA1C 7.0* 10/05/2012   Lipid Panel     Component Value Date/Time   CHOL 146 12/14/2012 1618   TRIG 129 12/14/2012 1618   HDL 47 12/14/2012 1618   CHOLHDL 3.1 12/14/2012 1618   VLDL 26 12/14/2012 1618   LDLCALC 73 12/14/2012 1618       Assessment and plan:   Patient Active Problem List   Diagnosis Date Noted  . Unspecified essential hypertension 04/17/2013  . GERD (gastroesophageal reflux disease) 08/30/2012   . Diastolic CHF, chronic 08/30/2012  . Hypertension   . Hypothyroidism   . Hyperlipemia   . Diabetes mellitus type II, uncontrolled    Refilled meds today Pt advised to start back on toprol XL 50 mg and continue the lisinopril as ordered.   Check labs today  BP check in 2 weeks RTC in 1 month  The patient was given clear instructions to go to ER or return to medical center if symptoms don't improve, worsen or new problems develop.  The patient verbalized understanding.  The patient was told to call to get any lab results if not heard anything in the next week.     Rodney Langton, MD, CDE, FAAFP [facility] [place], [place]

## 2013-04-17 NOTE — Progress Notes (Signed)
Patient was recently in the hospital for kidney failure Upset she was never notified of her bloodwork from last month

## 2013-04-17 NOTE — Patient Instructions (Addendum)
Hypertension As your heart beats, it forces blood through your arteries. This force is your blood pressure. If the pressure is too high, it is called hypertension (HTN) or high blood pressure. HTN is dangerous because you may have it and not know it. High blood pressure may mean that your heart has to work harder to pump blood. Your arteries may be narrow or stiff. The extra work puts you at risk for heart disease, stroke, and other problems.  Blood pressure consists of two numbers, a higher number over a lower, 110/72, for example. It is stated as "110 over 72." The ideal is below 120 for the top number (systolic) and under 80 for the bottom (diastolic). Write down your blood pressure today. You should pay close attention to your blood pressure if you have certain conditions such as:  Heart failure.  Prior heart attack.  Diabetes  Chronic kidney disease.  Prior stroke.  Multiple risk factors for heart disease. To see if you have HTN, your blood pressure should be measured while you are seated with your arm held at the level of the heart. It should be measured at least twice. A one-time elevated blood pressure reading (especially in the Emergency Department) does not mean that you need treatment. There may be conditions in which the blood pressure is different between your right and left arms. It is important to see your caregiver soon for a recheck. Most people have essential hypertension which means that there is not a specific cause. This type of high blood pressure may be lowered by changing lifestyle factors such as:  Stress.  Smoking.  Lack of exercise.  Excessive weight.  Drug/tobacco/alcohol use.  Eating less salt. Most people do not have symptoms from high blood pressure until it has caused damage to the body. Effective treatment can often prevent, delay or reduce that damage. TREATMENT  When a cause has been identified, treatment for high blood pressure is directed at the  cause. There are a large number of medications to treat HTN. These fall into several categories, and your caregiver will help you select the medicines that are best for you. Medications may have side effects. You should review side effects with your caregiver. If your blood pressure stays high after you have made lifestyle changes or started on medicines,   Your medication(s) may need to be changed.  Other problems may need to be addressed.  Be certain you understand your prescriptions, and know how and when to take your medicine.  Be sure to follow up with your caregiver within the time frame advised (usually within two weeks) to have your blood pressure rechecked and to review your medications.  If you are taking more than one medicine to lower your blood pressure, make sure you know how and at what times they should be taken. Taking two medicines at the same time can result in blood pressure that is too low. SEEK IMMEDIATE MEDICAL CARE IF:  You develop a severe headache, blurred or changing vision, or confusion.  You have unusual weakness or numbness, or a faint feeling.  You have severe chest or abdominal pain, vomiting, or breathing problems. MAKE SURE YOU:   Understand these instructions.  Will watch your condition.  Will get help right away if you are not doing well or get worse. Document Released: 09/07/2005 Document Revised: 11/30/2011 Document Reviewed: 04/27/2008 ExitCare Patient Information 2014 [place], [place]. 1800 Calorie Diet for Diabetes Meal Planning The 1800 calorie diet is designed for eating up  to 1800 calories each day. Following this diet and making healthy meal choices can help improve overall health. This diet controls blood sugar (glucose) levels and can also help lower blood pressure and cholesterol. SERVING SIZES Measuring foods and serving sizes helps to make sure you are getting the right amount of food. The list below tells how big or small some common  serving sizes are:  1 oz.........4 stacked dice.  3 oz........Marland KitchenDeck of cards.  1 tsp.......Marland KitchenTip of little finger.  1 tbs......Marland KitchenMarland KitchenThumb.  2 tbs.......Marland KitchenGolf ball.   cup......Marland KitchenHalf of a fist.  1 cup.......Marland KitchenA fist. GUIDELINES FOR CHOOSING FOODS The goal of this diet is to eat a variety of foods and limit calories to 1800 each day. This can be done by choosing foods that are low in calories and fat. The diet also suggests eating small amounts of food frequently. Doing this helps control your blood glucose levels so they do not get too high or too low. Each meal or snack may include a protein food source to help you feel more satisfied and to stabilize your blood glucose. Try to eat about the same amount of food around the same time each day. This includes weekend days, travel days, and days off work. Space your meals about 4 to 5 hours apart and add a snack between them if you wish.  For example, a daily food plan could include breakfast, a morning snack, lunch, dinner, and an evening snack. Healthy meals and snacks include whole grains, vegetables, fruits, lean meats, poultry, fish, and dairy products. As you plan your meals, select a variety of foods. Choose from the bread and starch, vegetable, fruit, dairy, and meat/protein groups. Examples of foods from each group and their suggested serving sizes are listed below. Use measuring cups and spoons to become familiar with what a healthy portion looks like. Bread and Starch Each serving equals 15 grams of carbohydrates.  1 slice bread.   bagel.   cup cold cereal (unsweetened).   cup hot cereal or mashed potatoes.  1 small potato (size of a computer mouse).   cup cooked pasta or rice.   English muffin.  1 cup broth-based soup.  3 cups of popcorn.  4 to 6 whole-wheat crackers.   cup cooked beans, peas, or corn. Vegetable Each serving equals 5 grams of carbohydrates.   cup cooked vegetables.  1 cup raw vegetables.    cup tomato or vegetable juice. Fruit Each serving equals 15 grams of carbohydrates.  1 small apple or orange.  1 cup watermelon or strawberries.   cup applesauce (no sugar added).  2 tbs raisins.   banana.   cup canned fruit, packed in water, its own juice, or sweetened with a sugar substitute.   cup unsweetened fruit juice. Dairy Each serving equals 12 to 15 grams of carbohydrates.  1 cup fat-free milk.  6 oz artificially sweetened yogurt or plain yogurt.  1 cup low-fat buttermilk.  1 cup soy milk.  1 cup almond milk. Meat/Protein  1 large egg.  2 to 3 oz meat, poultry, or fish.   cup low-fat cottage cheese.  1 tbs peanut butter.  1 oz low-fat cheese.   cup tuna in water.   cup tofu. Fat  1 tsp oil.  1 tsp trans-fat-free margarine.  1 tsp butter.  1 tsp mayonnaise.  2 tbs avocado.  1 tbs salad dressing.  1 tbs cream cheese.  2 tbs sour cream. SAMPLE 1800 CALORIE DIET PLAN Breakfast   cup unsweetened  cereal (1 carb serving).  1 cup fat-free milk (1 carb serving).  1 slice whole-wheat toast (1 carb serving).   small banana (1 carb serving).  1 scrambled egg.  1 tsp trans-fat-free margarine. Lunch  Tuna sandwich.  2 slices whole-wheat bread (2 carb servings).   cup canned tuna in water, drained.  1 tbs reduced fat mayonnaise.  1 stalk celery, chopped.  2 slices tomato.  1 lettuce leaf.  1 cup carrot sticks.  24 to 30 seedless grapes (2 carb servings).  6 oz light yogurt (1 carb serving). Afternoon Snack  3 graham cracker squares (1 carb serving).  Fat-free milk, 1 cup (1 carb serving).  1 tbs peanut butter. Dinner  3 oz salmon, broiled with 1 tsp oil.  1 cup mashed potatoes (2 carb servings) with 1 tsp trans-fat-free margarine.  1 cup fresh or frozen green beans.  1 cup steamed asparagus.  1 cup fat-free milk (1 carb serving). Evening Snack  3 cups air-popped popcorn (1 carb serving).  2 tbs  parmesan cheese sprinkled on top. MEAL PLAN Use this worksheet to help you make a daily meal plan based on the 1800 calorie diet suggestions. If you are using this plan to help you control your blood glucose, you may interchange carbohydrate-containing foods (dairy, starches, and fruits). Select a variety of fresh foods of varying colors and flavors. The total amount of carbohydrate in your meals or snacks is more important than making sure you include all of the food groups every time you eat. Choose from the following foods to build your day's meals:  8 Starches.  4 Vegetables.  3 Fruits.  2 Dairy.  6 to 7 oz Meat/Protein.  Up to 4 Fats. Your dietician can use this worksheet to help you decide how many servings and which types of foods are right for you. BREAKFAST Food Group and Servings / Food Choice Starch ________________________________________________________ Dairy _________________________________________________________ Fruit _________________________________________________________ Meat/Protein __________________________________________________ Fat ___________________________________________________________ LUNCH Food Group and Servings / Food Choice Starch ________________________________________________________ Meat/Protein __________________________________________________ Vegetable _____________________________________________________ Fruit _________________________________________________________ Dairy _________________________________________________________ Fat ___________________________________________________________ Aura Fey Food Group and Servings / Food Choice Starch ________________________________________________________ Meat/Protein __________________________________________________ Fruit __________________________________________________________ Dairy _________________________________________________________ Laural Golden Food Group and Servings / Food  Choice Starch _________________________________________________________ Meat/Protein ___________________________________________________ Dairy __________________________________________________________ Vegetable ______________________________________________________ Fruit ___________________________________________________________ Fat ____________________________________________________________ Lollie Sails Food Group and Servings / Food Choice Fruit __________________________________________________________ Meat/Protein ___________________________________________________ Dairy __________________________________________________________ Starch _________________________________________________________ DAILY TOTALS Starch ____________________________ Vegetable _________________________ Fruit _____________________________ Dairy _____________________________ Meat/Protein______________________ Fat _______________________________ Document Released: 03/30/2005 Document Revised: 11/30/2011 Document Reviewed: 07/24/2011 ExitCare Patient Information 2014 [employer]

## 2013-04-18 ENCOUNTER — Telehealth: Payer: Self-pay | Admitting: *Deleted

## 2013-04-18 LAB — COMPLETE METABOLIC PANEL WITH GFR
ALT: 21 U/L (ref 0–35)
CO2: 27 mEq/L (ref 19–32)
Calcium: 9.4 mg/dL (ref 8.4–10.5)
Chloride: 104 mEq/L (ref 96–112)
GFR, Est African American: 84 mL/min
Potassium: 4.3 mEq/L (ref 3.5–5.3)
Sodium: 143 mEq/L (ref 135–145)
Total Protein: 6.7 g/dL (ref 6.0–8.3)

## 2013-04-18 NOTE — Telephone Encounter (Signed)
04/18/13 Message left  Via telephone that kidney function improved back to normal. Other labs are okay. P.Derrico Zhong,RN BSN [facility]

## 2013-04-18 NOTE — Telephone Encounter (Signed)
Patient not available Left message on machine to please return our call 

## 2013-04-18 NOTE — Progress Notes (Signed)
Quick Note:  Please inform patient that her kidney function has improved back to normal. Other labs OK.   Rodney Langton, MD, CDE, FAAFP [facility] [place], [place]   ______

## 2013-04-18 NOTE — Telephone Encounter (Signed)
Spoke with Viola Aware of her recent lab results

## 2013-04-20 ENCOUNTER — Telehealth: Payer: Self-pay | Admitting: Internal Medicine

## 2013-04-20 NOTE — Telephone Encounter (Signed)
Pt has swelling in legs and was told by Dr this could be due to bp meds.  Would like to speak to nurse/ Dr about this matter and whether there is alternative for medication. Please f/u with pt.

## 2013-04-21 ENCOUNTER — Telehealth: Payer: Self-pay

## 2013-04-21 NOTE — Telephone Encounter (Signed)
Patient called complaining of feet swelling and some SOB i offered her an appointment and she refused telling me she is not coming in She wanted to know if dr should change her blood pressure medication Stated that  The swelling is a side affect of lisinopril-(that was told to her at the health dept)

## 2013-04-27 ENCOUNTER — Ambulatory Visit: Payer: No Typology Code available for payment source | Attending: Family Medicine | Admitting: Internal Medicine

## 2013-04-27 VITALS — BP 150/84 | HR 92 | Temp 98.0°F | Resp 17 | Wt 265.4 lb

## 2013-04-27 DIAGNOSIS — I5032 Chronic diastolic (congestive) heart failure: Secondary | ICD-10-CM

## 2013-04-27 DIAGNOSIS — I10 Essential (primary) hypertension: Secondary | ICD-10-CM

## 2013-04-27 DIAGNOSIS — I509 Heart failure, unspecified: Secondary | ICD-10-CM

## 2013-04-27 DIAGNOSIS — R609 Edema, unspecified: Secondary | ICD-10-CM

## 2013-04-27 DIAGNOSIS — R600 Localized edema: Secondary | ICD-10-CM

## 2013-04-27 MED ORDER — METOPROLOL SUCCINATE ER 50 MG PO TB24: 100.0000 mg | ORAL_TABLET | Freq: Every day | ORAL | Status: DC

## 2013-04-27 MED ORDER — TRAMADOL HCL 50 MG PO TABS: 50.0000 mg | ORAL_TABLET | Freq: Three times a day (TID) | ORAL | Status: DC | PRN

## 2013-04-27 NOTE — Progress Notes (Signed)
[name], female   DOB: 04-20-1951, 62 y.o.   MRN: [id number] Patient Demographics  Anita James, is a 62 y.o. female  BJY:[id number]  [id number]  DOB - 1951-02-09  Chief Complaint  [name]        [name] I explained to her that she had the labs checked on 04/17/13 and creatinine has normalized.  [name], [name], [name], [name], [name]   Objective:    Filed Vitals:   04/27/13 1522  BP: 150/84  Pulse: 92  Temp: 98 F (36.7 C)  Resp: 17  Weight: 265 lb 6.4 oz (120.385 kg)  SpO2: 100%     ALLERGIES:   Allergies  Allergen Reactions  . Sulfa Antibiotics Shortness Of Breath  . Amoxicillin-Pot Clavulanate     Combination of medications tear lining of stomach    PAST MEDICAL HISTORY: Past Medical History  Diagnosis Date  . Diabetes mellitus type II, [name]     MEDICATIONS AT HOME: Prior to Admission medications   Medication Sig Start Date End Date Taking? Authorizing Provider  Choline Fenofibrate 135 MG capsule Take 135 mg by mouth daily. 01/18/13   Edsel Petrin, DO  Docusate Sodium (DSS) 100 MG CAPS Take 100 mg by mouth 2 (two) times daily. 01/18/13   Edsel Petrin, DO  gabapentin (NEURONTIN) 300 MG capsule Take 1 capsule (300 mg total) by mouth 4 (four) times daily. 04/17/13   Clanford Cyndie Mull, MD  insulin aspart (NOVOLOG) 100 UNIT/ML injection Inject 20 Units into the skin 3 (three) times daily before meals. 01/18/13   Edsel Petrin, DO  insulin glargine (LANTUS) 100 UNIT/ML injection Inject 0.6 mLs (60 Units total) into the  skin at bedtime. 01/18/13   Edsel Petrin, DO  levothyroxine (SYNTHROID, LEVOTHROID) 100 MCG tablet Take 1 tablet (100 mcg total) by mouth daily. 04/17/13   Clanford Cyndie Mull, MD  lisinopril (PRINIVIL,ZESTRIL) 40 MG tablet Take 1 tablet (40 mg total) by mouth daily. 04/17/13   Clanford Cyndie Mull, MD  methocarbamol (ROBAXIN) 500 MG tablet Take 1 tablet (500 mg total) by mouth 4 (four) times daily. 03/09/13   Alison Murray, MD  metoprolol succinate (TOPROL-XL) 50 MG 24 hr tablet Take 2 tablets (100 mg total) by mouth daily. 04/27/13   Ripudeep Jenna Luo, MD  Multiple Vitamins-Minerals (EYE VITAMINS) CAPS Take 1 capsule by mouth daily. 08/11/12   Leroy Sea, MD  nitroGLYCERIN (NITROSTAT) 0.4 MG SL tablet Place 0.4 mg under the tongue every 5 (five) minutes as needed for chest pain. 10/06/12   Leroy Sea, MD  oxyCODONE-acetaminophen (PERCOCET/ROXICET) 5-325 MG per tablet Take 1 tablet by mouth every 8 (eight) hours as needed for pain. 04/15/13   Penny Pia, MD  pantoprazole (PROTONIX) 40 MG tablet Take 1 tablet (40 mg total) by mouth daily. 04/17/13   Clanford Cyndie Mull, MD  simvastatin (ZOCOR) 20 MG tablet Take 1 tablet (20 mg total) by mouth at bedtime. 04/17/13   Clanford Cyndie Mull, MD  Exam  General appearance :Awake, alert, NAD, Speech Clear.  HEENT: Atraumatic and Normocephalic, PERLA Neck: supple, no JVD. No cervical lymphadenopathy.  Chest: Clear to auscultation bilaterally, no wheezing, rales or rhonchi CVS: S1 S2 regular, no murmurs.  Abdomen: soft, NBS, NT, ND, no gaurding, rigidity or rebound. Extremities: no cyanosis or clubbing, B/L Lower Ext shows trace-1+ edema Neurology: Awake alert, and oriented X 3, CN II-XII intact, Non focal Skin: No Rash or lesions Wounds:N/A    Data Review   Basic Metabolic Panel: No results found for this basename: NA, K, CL, CO2, GLUCOSE, BUN, CREATININE, CALCIUM, MG, PHOS,  in the last 168 hours Liver Function Tests: No results found  for this basename: AST, ALT, ALKPHOS, BILITOT, PROT, ALBUMIN,  in the last 168 hours  CBC: No results found for this basename: WBC, NEUTROABS, HGB, HCT, MCV, PLT,  in the last 168 hours  ------------------------------------------------------------------------------------------------------------------ No results found for this basename: HGBA1C,  in the last 72 hours ------------------------------------------------------------------------------------------------------------------ No results found for this basename: CHOL, HDL, LDLCALC, TRIG, CHOLHDL, LDLDIRECT,  in the last 72 hours ------------------------------------------------------------------------------------------------------------------ No results found for this basename: TSH, T4TOTAL, FREET3, T3FREE, THYROIDAB,  in the last 72 hours ------------------------------------------------------------------------------------------------------------------ No results found for this basename: VITAMINB12, FOLATE, FERRITIN, TIBC, IRON, RETICCTPCT,  in the last 72 hours  Coagulation profile  No results found for this basename: INR, PROTIME,  in the last 168 hours    Assessment & Plan   Active Problems: AKI: This has resolved, labs drawn on 04/17/2013 showed BUN of 8 creatinine 0.87  Hypertension: Somewhat uncontrolled 150/84 today but patient is also upset and agitated. She however states that it is also high at home. - Increase to Toprol-XL 100 mg daily. I counseled patient to check her blood pressure daily, if she notices dizziness over BP is low 100s, she can change it back to 50 mg daily. Continue lisinopril.    Per Edema: Her main concern is about peripheral edema - Per patient, she was on Lasix and which caused acute kidney injury and now she will not be on any diuretics ever again. I explained her that she can get Ted Hoses, elevate legs. I offered her a cardiology referral.   Recommendations: cardiology referral  Follow-up in 1  month. She only wants to followup with Dr. Laural Benes.    RAI,RIPUDEEP M.D. 04/27/2013, 3:52 PM

## 2013-04-27 NOTE — Progress Notes (Signed)
Patient here for follow up Still having some shortness of breath Left side kidney pain Right side hip pain

## 2013-05-19 NOTE — Progress Notes (Signed)
Patient was in to inquire about a sliding scale (05/19/2013) Per DR jegede changed her novolog to 25 units  TID

## 2013-05-30 ENCOUNTER — Ambulatory Visit: Payer: No Typology Code available for payment source | Admitting: Cardiology

## 2013-05-31 ENCOUNTER — Encounter: Payer: Self-pay | Admitting: Cardiology

## 2013-05-31 ENCOUNTER — Ambulatory Visit: Payer: No Typology Code available for payment source | Attending: Internal Medicine | Admitting: Internal Medicine

## 2013-05-31 ENCOUNTER — Ambulatory Visit (INDEPENDENT_AMBULATORY_CARE_PROVIDER_SITE_OTHER): Payer: No Typology Code available for payment source | Admitting: Cardiology

## 2013-05-31 VITALS — BP 166/91 | HR 96 | Temp 98.2°F | Resp 15

## 2013-05-31 VITALS — BP 170/98 | HR 88 | Ht 66.0 in | Wt 266.0 lb

## 2013-05-31 DIAGNOSIS — E785 Hyperlipidemia, unspecified: Secondary | ICD-10-CM

## 2013-05-31 DIAGNOSIS — I509 Heart failure, unspecified: Secondary | ICD-10-CM

## 2013-05-31 DIAGNOSIS — R609 Edema, unspecified: Secondary | ICD-10-CM

## 2013-05-31 DIAGNOSIS — I10 Essential (primary) hypertension: Secondary | ICD-10-CM

## 2013-05-31 DIAGNOSIS — I5032 Chronic diastolic (congestive) heart failure: Secondary | ICD-10-CM

## 2013-05-31 DIAGNOSIS — I119 Hypertensive heart disease without heart failure: Secondary | ICD-10-CM

## 2013-05-31 DIAGNOSIS — E039 Hypothyroidism, unspecified: Secondary | ICD-10-CM

## 2013-05-31 DIAGNOSIS — G894 Chronic pain syndrome: Secondary | ICD-10-CM | POA: Insufficient documentation

## 2013-05-31 LAB — BASIC METABOLIC PANEL WITH GFR
BUN: 11 mg/dL (ref 6–23)
CO2: 28 meq/L (ref 19–32)
Calcium: 9 mg/dL (ref 8.4–10.5)
Chloride: 103 meq/L (ref 96–112)
Creat: 0.74 mg/dL (ref 0.50–1.10)
Glucose, Bld: 231 mg/dL — ABNORMAL HIGH (ref 70–99)
Potassium: 4.1 meq/L (ref 3.5–5.3)
Sodium: 138 meq/L (ref 135–145)

## 2013-05-31 MED ORDER — TRAMADOL HCL 50 MG PO TABS: 100.0000 mg | ORAL_TABLET | Freq: Three times a day (TID) | ORAL | Status: DC | PRN

## 2013-05-31 MED ORDER — HYDROCHLOROTHIAZIDE 25 MG PO TABS: 25.0000 mg | ORAL_TABLET | Freq: Every day | ORAL | Status: DC

## 2013-05-31 NOTE — Progress Notes (Signed)
Anita James Date of Birth:  February 23, 1951 [facility] [address] [place], [place]  [address] [phone]         Fax   [phone]  History of Present Illness: This pleasant 62 year old woman is seen for a post hospital office visit.  She was recently hospitalized at [facility] for fluid retention felt to be secondary to amlodipine resulting in significant pedal edema.  She does not have any history of known coronary disease despite having multiple risk factors for ischemic heart disease including diabetes, hypertension, hypercholesterolemia, and sedentary lifestyle.  She was previously hospitalized in January 2014 with left-sided chest pain and at that time she did not have any evidence by enzymes for myocardial damage.  She underwent two day Lexa scan Myoview stress test on 10/08/11 which showed no evidence of ischemia and she had an ejection fraction of 72% and no wall motion abnormalities. Since discharge from the hospital she has been feeling well.  She's had no recurrent chest discomfort Current Outpatient Prescriptions  Medication Sig Dispense Refill  . Choline Fenofibrate 135 MG capsule Take 135 mg by mouth daily.      Tery Sanfilippo Sodium (DSS) 100 MG CAPS Take 100 mg by mouth 2 (two) times daily.  30 each  3  . gabapentin (NEURONTIN) 300 MG capsule Take 1 capsule (300 mg total) by mouth 4 (four) times daily.  120 capsule  2  . hydrochlorothiazide (HYDRODIURIL) 25 MG tablet Take 1 tablet (25 mg total) by mouth daily.  90 tablet  3  . insulin aspart (NOVOLOG) 100 UNIT/ML injection Inject 20 Units into the skin 3 (three) times daily before meals.  1 vial  12  . insulin glargine (LANTUS) 100 UNIT/ML injection Inject 0.6 mLs (60 Units total) into the skin at bedtime.  10 mL  12  . levothyroxine (SYNTHROID, LEVOTHROID) 100 MCG tablet Take 1 tablet (100 mcg total) by mouth daily.  30 tablet  3  . lisinopril (PRINIVIL,ZESTRIL) 40 MG tablet Take 1 tablet (40 mg  total) by mouth daily.  30 tablet  2  . methocarbamol (ROBAXIN) 500 MG tablet Take 1 tablet (500 mg total) by mouth 4 (four) times daily.  30 tablet  2  . metoprolol succinate (TOPROL-XL) 50 MG 24 hr tablet Take 2 tablets (100 mg total) by mouth daily.  60 tablet  3  . Multiple Vitamins-Minerals (EYE VITAMINS) CAPS Take 1 capsule by mouth daily.      . nitroGLYCERIN (NITROSTAT) 0.4 MG SL tablet Place 0.4 mg under the tongue every 5 (five) minutes as needed for chest pain.      Marland Kitchen oxyCODONE-acetaminophen (PERCOCET/ROXICET) 5-325 MG per tablet Take 1 tablet by mouth every 8 (eight) hours as needed for pain.  15 tablet  0  . pantoprazole (PROTONIX) 40 MG tablet Take 1 tablet (40 mg total) by mouth daily.  60 tablet  2  . simvastatin (ZOCOR) 20 MG tablet Take 1 tablet (20 mg total) by mouth at bedtime.  30 tablet  2  . traMADol (ULTRAM) 50 MG tablet Take 2 tablets (100 mg total) by mouth every 8 (eight) hours as needed for pain.  60 tablet  0  . [DISCONTINUED] fenofibrate (TRICOR) 145 MG tablet Take 1 tablet (145 mg total) by mouth daily.  30 tablet  2  . [DISCONTINUED] insulin detemir (LEVEMIR) 100 UNIT/ML injection Inject 60 Units into the skin at bedtime.  10 mL  1  . [DISCONTINUED] metoprolol (LOPRESSOR) 50 MG tablet  Take 1 tablet (50 mg total) by mouth 2 (two) times daily.  60 tablet  2   No current facility-administered medications for this visit.    Allergies  Allergen Reactions  . Sulfa Antibiotics Shortness Of Breath  . Amoxicillin-Pot Clavulanate     Combination of medications tear lining of stomach    Patient Active Problem List   Diagnosis Date Noted  . Chronic pain syndrome 05/31/2013  . Peripheral edema 04/27/2013  . Unspecified essential hypertension 04/17/2013  . GERD (gastroesophageal reflux disease) 08/30/2012  . Diastolic CHF, chronic 08/30/2012  . Hypertension   . Hypothyroidism   . Hyperlipemia   . Diabetes mellitus type II, uncontrolled     History  Smoking  status  . Never Smoker   Smokeless tobacco  . Never Used    History  Alcohol Use No    No family history on file.  Review of Systems: Constitutional: no fever chills diaphoresis or fatigue or change in weight.  Head and neck: no hearing loss, no epistaxis, no photophobia or visual disturbance. Respiratory: No cough, shortness of breath or wheezing. Cardiovascular: [name] peripheral edema, palpitations. Gastrointestinal: No abdominal distention, no abdominal pain, no change in bowel habits hematochezia or melena. Genitourinary: No dysuria, no frequency, no urgency, no nocturia. Musculoskeletal:No arthralgias, no back pain, no gait disturbance or myalgias. Neurological: No dizziness, no headaches, no numbness, no seizures, no syncope, no weakness, no tremors. Hematologic: No lymphadenopathy, no easy bruising. Psychiatric: No confusion, no hallucinations, no sleep disturbance.    Physical Exam: Filed Vitals:   05/31/13 1632  BP: 170/98  Pulse: 88   general appearance reveals a large woman in no acute distress.The head and neck exam reveals pupils equal and reactive.  Extraocular movements are full.  There is no scleral icterus.  The mouth and pharynx are normal.  The neck is supple.  The carotids reveal no bruits.  The jugular venous pressure is normal.  The  thyroid is not enlarged.  There is no lymphadenopathy.  The chest is clear to percussion and auscultation.  There are no rales or rhonchi.  Expansion of the chest is symmetrical.  The precordium is quiet.  The first heart sound is normal.  The second heart sound is physiologically split.  There is no murmur gallop rub or click.  There is no abnormal lift or heave.  The abdomen is soft and nontender.  The bowel sounds are normal.  The liver and spleen are not enlarged.  There are no abdominal masses.  There are no abdominal bruits.  Extremities reveal good pedal pulses.  There is no phlebitis or edema.  There is no cyanosis or  clubbing.  Strength is normal and symmetrical in all extremities.  There is no lateralizing weakness.  There are no sensory deficits.  The skin is warm and dry.  There is no rash.  EKG shows normal sinus rhythm and is within normal limits   Assessment / Plan: Continue same medication.  Recheck in one year for office visit and EKG, worsen or as necessary

## 2013-05-31 NOTE — Assessment & Plan Note (Signed)
Patient is clinically euthyroid on current Synthroid replacement therapy

## 2013-05-31 NOTE — Assessment & Plan Note (Signed)
Blood pressure is still running high and her blood pressure medicines were adjusted earlier today by her internist.

## 2013-05-31 NOTE — Progress Notes (Signed)
[name], female   DOB: 1951-02-26, 62 y.o.   MRN: [id number] Patient Demographics  Anita James, is a 62 y.o. female  [id number]  [id number]  DOB - [birth year]  Chief Complaint  [name]        Subjective:   Anita James today is here for a follow up visit. She is complaining of shortness of breath especially after walking a short distance. She is known to have diastolic heart failure, last echo was in 2013 June. She has long-standing history of hypertension most of the time uncontrolled. She is also complaining of easy fatigability and shoulder and neck pain. The shoulder and neck pain her chronic and she said the tramadol (does and the Percocet not working [name], [name], [name], [name], No Cough.  ALLERGIES:   Allergies  Allergen Reactions  . Sulfa Antibiotics Shortness Of Breath  . Amoxicillin-Pot Clavulanate     Combination of medications tear lining of stomach    PAST MEDICAL HISTORY: Past Medical History  Diagnosis Date  . Diabetes mellitus type II, [name]     MEDICATIONS AT HOME: Prior to Admission medications   Medication Sig Start Date End Date Taking? Authorizing Provider  Choline Fenofibrate 135 MG capsule Take 135 mg by mouth daily. 01/18/13   Edsel Petrin, DO  Docusate Sodium (DSS) 100 MG CAPS Take 100 mg by mouth 2 (two) times daily. 01/18/13   Edsel Petrin, DO  gabapentin (NEURONTIN) 300 MG capsule Take 1 capsule (300 mg total) by mouth 4 (four) times daily. 04/17/13   Clanford Cyndie Mull, MD  hydrochlorothiazide (HYDRODIURIL) 25 MG tablet Take 1 tablet (25 mg total) by mouth daily. 05/31/13   Jeanann Lewandowsky, MD  insulin aspart (NOVOLOG) 100 UNIT/ML injection Inject 20 Units into the  skin 3 (three) times daily before meals. 01/18/13   Edsel Petrin, DO  insulin glargine (LANTUS) 100 UNIT/ML injection Inject 0.6 mLs (60 Units total) into the skin at bedtime. 01/18/13   Edsel Petrin, DO  levothyroxine (SYNTHROID, LEVOTHROID) 100 MCG tablet Take 1 tablet (100 mcg total) by mouth daily. 04/17/13   Clanford Cyndie Mull, MD  lisinopril (PRINIVIL,ZESTRIL) 40 MG tablet Take 1 tablet (40 mg total) by mouth daily. 04/17/13   Clanford Cyndie Mull, MD  methocarbamol (ROBAXIN) 500 MG tablet Take 1 tablet (500 mg total) by mouth 4 (four) times daily. 03/09/13   Alison Murray, MD  metoprolol succinate (TOPROL-XL) 50 MG 24 hr tablet Take 2 tablets (100 mg total) by mouth daily. 04/27/13   Ripudeep Jenna Luo, MD  Multiple Vitamins-Minerals (EYE VITAMINS) CAPS Take 1 capsule by mouth daily. 08/11/12   Leroy Sea, MD  nitroGLYCERIN (NITROSTAT) 0.4 MG SL tablet Place 0.4 mg under the tongue every 5 (five) minutes as needed for chest pain. 10/06/12   Leroy Sea, MD  oxyCODONE-acetaminophen (PERCOCET/ROXICET) 5-325 MG per tablet Take 1 tablet by mouth every 8 (eight) hours as needed for pain. 04/15/13   Penny Pia, MD  pantoprazole (PROTONIX) 40 MG tablet Take 1 tablet (40 mg total) by mouth daily. 04/17/13   Clanford Cyndie Mull, MD  simvastatin (ZOCOR) 20 MG tablet Take 1 tablet (20 mg total) by  mouth at bedtime. 04/17/13   Clanford Cyndie Mull, MD  traMADol (ULTRAM) 50 MG tablet Take 1 tablet (50 mg total) by mouth every 8 (eight) hours as needed for pain. 04/27/13   Ripudeep Jenna Luo, MD  traMADol (ULTRAM) 50 MG tablet Take 2 tablets (100 mg total) by mouth every 8 (eight) hours as needed for pain. 05/31/13   Jeanann Lewandowsky, MD     Objective:   Filed Vitals:   05/31/13 1446  BP: 166/91  Pulse: 96  Temp: 98.2 F (36.8 C)  Resp: 15  SpO2: 100%    Exam General appearance :Awake, alert, not in any distress. Speech Clear. Not toxic Looking HEENT: Atraumatic and Normocephalic, pupils  equally reactive to light and accomodation Neck: supple, no JVD. No cervical lymphadenopathy.  Chest:Good air entry bilaterally, no added sounds  CVS: S1 S2 regular, no murmurs.  Abdomen: Bowel sounds present, Non tender and not distended with no gaurding, rigidity or rebound. Extremities: B/L Lower Ext shows no edema, both legs are warm to touch Neurology: Awake alert, and oriented X 3, CN II-XII intact, Non focal Skin:No Rash Wounds:N/A   Data Review   CBC No results found for this basename: WBC, HGB, HCT, PLT, MCV, MCH, MCHC, RDW, NEUTRABS, LYMPHSABS, MONOABS, EOSABS, BASOSABS, BANDABS, BANDSABD,  in the last 168 hours  Chemistries   No results found for this basename: NA, K, CL, CO2, GLUCOSE, BUN, CREATININE, GFRCGP, CALCIUM, MG, AST, ALT, ALKPHOS, BILITOT,  in the last 168 hours ------------------------------------------------------------------------------------------------------------------ No results found for this basename: HGBA1C,  in the last 72 hours ------------------------------------------------------------------------------------------------------------------ No results found for this basename: CHOL, HDL, LDLCALC, TRIG, CHOLHDL, LDLDIRECT,  in the last 72 hours ------------------------------------------------------------------------------------------------------------------ No results found for this basename: TSH, T4TOTAL, FREET3, T3FREE, THYROIDAB,  in the last 72 hours ------------------------------------------------------------------------------------------------------------------ No results found for this basename: VITAMINB12, FOLATE, FERRITIN, TIBC, IRON, RETICCTPCT,  in the last 72 hours  Coagulation profile  No results found for this basename: INR, PROTIME,  in the last 168 hours    Assessment & Plan   Patient Active Problem List   Diagnosis Date Noted  . Chronic pain syndrome 05/31/2013  . Peripheral edema 04/27/2013  . Unspecified essential hypertension  04/17/2013  . GERD (gastroesophageal reflux disease) 08/30/2012  . Diastolic CHF, chronic 08/30/2012  . Hypertension   . Hypothyroidism   . Hyperlipemia   . Diabetes mellitus type II, uncontrolled      Plan: Echocardiogram Chest x-ray Basic metabolic panel Vitamin D level Will review patient's with the above results  Increased tramadol 200 mg tablet by mouth 3 times a day Add hydrochlorothiazide 25 mg tablet by mouth daily  Patient extensively counseled about nutrition and exercise as tolerated She was encouraged to continue other medications The need for medication compliance was emphasized Patient also encouraged to keep the cardiology appointment this afternoon  Health Maintenance -Colonoscopy: Will schedule -Pap Smear: Not applicable -Mammogram: Will schedule -Vaccinations:  -TdAP  -PNA (PPSV23) (one dose after 65) (or one dose before 65 if chronic conditions)  -Zoster (1 dose after 60 yrs)  -Influenza  Follow up in 2 months   The patient was given clear instructions to go to ER or return to medical center if symptoms don't improve, worsen or new problems develop. The patient verbalized understanding. The patient was told to call to get lab results if they haven't heard anything in the next week.   Jeanann Lewandowsky, MD, MHA, FACP [facility] [place], [place] [phone]  05/31/2013, 3:30 PM

## 2013-05-31 NOTE — Progress Notes (Signed)
Patient here for follow up Complains of SOB Retaining fluid fatiqued all the time

## 2013-05-31 NOTE — Assessment & Plan Note (Signed)
The patient has not been experiencing any symptoms of recurrent acute diastolic heart failure.  Her edema has cleared with stopping her amlodipine.

## 2013-05-31 NOTE — Patient Instructions (Signed)
Your physician recommends that you continue on your current medications as directed. Please refer to the Current Medication list given to you today.  Your physician wants you to follow-up in: 1 year. You will receive a reminder letter in the mail two months in advance. If you don't receive a letter, please call our office to schedule the follow-up appointment.  

## 2013-06-01 ENCOUNTER — Encounter: Payer: Self-pay | Admitting: Internal Medicine

## 2013-06-01 LAB — VITAMIN D 25 HYDROXY (VIT D DEFICIENCY, FRACTURES): Vit D, 25-Hydroxy: 27 ng/mL — ABNORMAL LOW (ref 30–89)

## 2013-06-02 ENCOUNTER — Telehealth: Payer: Self-pay

## 2013-06-02 NOTE — Telephone Encounter (Signed)
Message copied by Lestine Mount on Fri Jun 02, 2013  3:46 PM ------      Message from: Jeanann Lewandowsky E      Created: Thu Jun 01, 2013  9:42 AM       Please call to inform patient that her kidney function has improved significantly. However her vitamin D level is slightly insufficient and she can correct this with over-the-counter vitamin D supplement 1000 units daily ------

## 2013-06-02 NOTE — Telephone Encounter (Signed)
Left message on machine to return our call. 

## 2013-06-08 ENCOUNTER — Telehealth: Payer: Self-pay

## 2013-06-09 ENCOUNTER — Telehealth: Payer: Self-pay

## 2013-06-09 NOTE — Telephone Encounter (Signed)
Patient has allergy to sulfa pharmacy will not fill HCTZ Per dr Hyman Hopes ok to switch to furosemide 40 mg

## 2013-06-19 ENCOUNTER — Emergency Department: Payer: Self-pay | Admitting: Emergency Medicine

## 2013-06-19 LAB — CBC
HCT: 38.2 % (ref 35.0–47.0)
HGB: 13.3 g/dL (ref 12.0–16.0)
MCV: 84 fL (ref 80–100)
RBC: 4.56 10*6/uL (ref 3.80–5.20)
WBC: 5.2 10*3/uL (ref 3.6–11.0)

## 2013-06-19 LAB — BASIC METABOLIC PANEL
Anion Gap: 4 — ABNORMAL LOW (ref 7–16)
Calcium, Total: 9.6 mg/dL (ref 8.5–10.1)
Chloride: 104 mmol/L (ref 98–107)
Co2: 30 mmol/L (ref 21–32)
Sodium: 138 mmol/L (ref 136–145)

## 2013-06-19 LAB — TROPONIN I: Troponin-I: <0.02 ng/mL

## 2013-06-19 LAB — CK TOTAL AND CKMB (NOT AT ARMC): CK, Total: 154 U/L (ref 21–215)

## 2013-06-21 ENCOUNTER — Emergency Department: Payer: Self-pay | Admitting: Internal Medicine

## 2013-06-21 LAB — URINALYSIS, COMPLETE
Bacteria: NONE SEEN
Bilirubin,UR: NEGATIVE
Blood: NEGATIVE
Nitrite: NEGATIVE
Ph: 7 (ref 4.5–8.0)
Protein: NEGATIVE
RBC,UR: 1 /HPF (ref 0–5)
Specific Gravity: 1.005 (ref 1.003–1.030)
WBC UR: 2 /HPF (ref 0–5)

## 2013-06-21 LAB — COMPREHENSIVE METABOLIC PANEL
Albumin: 3.7 g/dL (ref 3.4–5.0)
Alkaline Phosphatase: 69 U/L (ref 50–136)
Anion Gap: 7 (ref 7–16)
Calcium, Total: 8.9 mg/dL (ref 8.5–10.1)
Chloride: 105 mmol/L (ref 98–107)
Co2: 26 mmol/L (ref 21–32)
Creatinine: 0.74 mg/dL (ref 0.60–1.30)
EGFR (Non-African Amer.): >60
Glucose: 214 mg/dL — ABNORMAL HIGH (ref 65–99)
Potassium: 3.8 mmol/L (ref 3.5–5.1)
SGOT(AST): 33 U/L (ref 15–37)
SGPT (ALT): 29 U/L (ref 12–78)
Total Protein: 7.1 g/dL (ref 6.4–8.2)

## 2013-06-21 LAB — TROPONIN I: Troponin-I: <0.02 ng/mL

## 2013-06-21 LAB — CBC: MCV: 84 fL (ref 80–100)

## 2013-06-22 ENCOUNTER — Encounter: Payer: Self-pay | Admitting: Family Medicine

## 2013-06-22 ENCOUNTER — Ambulatory Visit: Payer: No Typology Code available for payment source | Attending: Internal Medicine | Admitting: Family Medicine

## 2013-06-22 VITALS — BP 132/88 | HR 77 | Temp 99.1°F | Resp 18 | Wt 258.0 lb

## 2013-06-22 DIAGNOSIS — E1165 Type 2 diabetes mellitus with hyperglycemia: Secondary | ICD-10-CM

## 2013-06-22 DIAGNOSIS — I509 Heart failure, unspecified: Secondary | ICD-10-CM

## 2013-06-22 DIAGNOSIS — I5032 Chronic diastolic (congestive) heart failure: Secondary | ICD-10-CM

## 2013-06-22 DIAGNOSIS — I10 Essential (primary) hypertension: Secondary | ICD-10-CM

## 2013-06-22 DIAGNOSIS — E1110 Type 2 diabetes mellitus with ketoacidosis without coma: Secondary | ICD-10-CM

## 2013-06-22 DIAGNOSIS — E1310 Other specified diabetes mellitus with ketoacidosis without coma: Secondary | ICD-10-CM | POA: Insufficient documentation

## 2013-06-22 DIAGNOSIS — Z23 Encounter for immunization: Secondary | ICD-10-CM

## 2013-06-22 DIAGNOSIS — J309 Allergic rhinitis, unspecified: Secondary | ICD-10-CM | POA: Insufficient documentation

## 2013-06-22 DIAGNOSIS — J329 Chronic sinusitis, unspecified: Secondary | ICD-10-CM

## 2013-06-22 MED ORDER — AZITHROMYCIN 250 MG PO TABS: ORAL_TABLET | ORAL | Status: DC

## 2013-06-22 MED ORDER — LORATADINE 10 MG PO TABS: 10.0000 mg | ORAL_TABLET | Freq: Every day | ORAL | Status: DC

## 2013-06-22 MED ORDER — FLUTICASONE PROPIONATE 50 MCG/ACT NA SUSP: 2.0000 | Freq: Every day | NASAL | Status: DC

## 2013-06-22 NOTE — Progress Notes (Signed)
[name], female   DOB: [birth year], 62 y.o.   MRN: [id number]  CC: allergies  HPI: Pt reports that she is having runny nose and cough, nasal congestion, ears have felt full.  Pt never got prescription filled for lasix.  She is unwilling to take other bp meds because she believes that it will cause kidney damage.  She had taken lasix in the past and developed acute kidney injury.  She reports no fever or chills.  She was recently seen at [facility] ER and discharged.  She had a CT scan and labs and reported as normal.  Pt was evaluated for left side pain and discomfort.  Pt says she has been ill and BS have been mildly elevated.    Allergies  Allergen Reactions  . Sulfa Antibiotics Shortness Of Breath  . Amoxicillin-Pot Clavulanate     Combination of medications tear lining of stomach   Past Medical History  Diagnosis Date  . Diabetes mellitus type II, [name]    Current Outpatient Prescriptions on File Prior to Visit  Medication Sig Dispense Refill  . Choline Fenofibrate 135 MG capsule Take 135 mg by mouth daily.      Tery Sanfilippo Sodium (DSS) 100 MG CAPS Take 100 mg by mouth 2 (two) times daily.  30 each  3  . gabapentin (NEURONTIN) 300 MG capsule Take 1 capsule (300 mg total) by mouth 4 (four) times daily.  120 capsule  2  . insulin aspart (NOVOLOG) 100 UNIT/ML injection Inject 20 Units into the skin 3 (three) times daily before meals.  1 vial  12  . insulin glargine (LANTUS) 100 UNIT/ML injection Inject 0.6 mLs (60 Units total) into the skin at bedtime.  10 mL  12  . levothyroxine (SYNTHROID, LEVOTHROID) 100 MCG tablet Take 1 tablet (100 mcg total) by mouth daily.  30 tablet  3  . lisinopril (PRINIVIL,ZESTRIL) 40 MG tablet Take 1 tablet (40 mg total) by mouth daily.  30 tablet  2  . methocarbamol (ROBAXIN) 500 MG tablet Take 1 tablet (500 mg  total) by mouth 4 (four) times daily.  30 tablet  2  . metoprolol succinate (TOPROL-XL) 50 MG 24 hr tablet Take 2 tablets (100 mg total) by mouth daily.  60 tablet  3  . Multiple Vitamins-Minerals (EYE VITAMINS) CAPS Take 1 capsule by mouth daily.      . nitroGLYCERIN (NITROSTAT) 0.4 MG SL tablet Place 0.4 mg under the tongue every 5 (five) minutes as needed for chest pain.      Marland Kitchen oxyCODONE-acetaminophen (PERCOCET/ROXICET) 5-325 MG per tablet Take 1 tablet by mouth every 8 (eight) hours as needed for pain.  15 tablet  0  . pantoprazole (PROTONIX) 40 MG tablet Take 1 tablet (40 mg total) by mouth daily.  60 tablet  2  . simvastatin (ZOCOR) 20 MG tablet Take 1 tablet (20 mg total) by mouth at bedtime.  30 tablet  2  . traMADol (ULTRAM) 50 MG tablet Take 2 tablets (100 mg total) by mouth every 8 (eight) hours as needed for pain.  60 tablet  0  . [DISCONTINUED] fenofibrate (TRICOR) 145 MG tablet Take 1 tablet (145 mg total) by mouth daily.  30 tablet  2  . [DISCONTINUED] insulin detemir (LEVEMIR) 100 UNIT/ML injection Inject 60 Units into  the skin at bedtime.  10 mL  1  . [DISCONTINUED] metoprolol (LOPRESSOR) 50 MG tablet Take 1 tablet (50 mg total) by mouth 2 (two) times daily.  60 tablet  2   No current facility-administered medications on file prior to visit.   History reviewed. No pertinent family history. History   Social History  . Marital Status: Divorced    Spouse Name: N/A    Number of Children: N/A  . Years of Education: N/A   Occupational History  . Not on file.   Social History Main Topics  . Smoking status: Never Smoker   . Smokeless tobacco: Never Used  . Alcohol Use: No  . Drug Use: No  . Sexual Activity:    Other Topics Concern  . Not on file   Social History Narrative  . No narrative on file    Review of Systems  Constitutional: Negative for fever, chills, diaphoresis, activity change, appetite change and fatigue.  HENT: Positive for ear congestion and nasal  congestion.  Negative for ear pain, nosebleeds, facial swelling, rhinorrhea, neck pain, neck stiffness and ear discharge.   Eyes: Negative for pain, discharge, redness, itching and visual disturbance.  Respiratory: Negative for cough, choking, chest tightness, shortness of breath, wheezing and stridor.   Cardiovascular: Negative for chest pain, palpitations and leg swelling.  Gastrointestinal: Negative for abdominal distention.  Genitourinary: Negative for dysuria, urgency, frequency, hematuria, flank pain, decreased urine volume, difficulty urinating and dyspareunia.  Musculoskeletal: Negative for back pain, joint swelling, arthralgias and gait problem.  Neurological: Negative for dizziness, tremors, seizures, syncope, facial asymmetry, speech difficulty, weakness, light-headedness, numbness and headaches.  Hematological: Negative for adenopathy. Does not bruise/bleed easily.  Psychiatric/Behavioral: Negative for hallucinations, behavioral problems, confusion, dysphoric mood, decreased concentration and agitation.    Objective:   Filed Vitals:   06/22/13 1220  BP: 132/88  Pulse: 77  Temp: 99.1 F (37.3 C)  Resp: 18    Physical Exam  Constitutional: Appears well-developed and well-nourished. No distress.  HENT: Normocephalic. External right and left ear normal. Oropharynx is clear and moist.  Eyes: Conjunctivae and EOM are normal. PERRLA, no scleral icterus.  Neck: Normal ROM. Neck supple. No JVD. No tracheal deviation. No thyromegaly.  CVS: RRR, S1/S2 +, no murmurs, no gallops, no carotid bruit.  Pulmonary: Effort and breath sounds normal, no stridor, rhonchi, wheezes, rales.  Abdominal: Soft. BS +,  no distension, tenderness, rebound or guarding.  Musculoskeletal: Normal range of motion. No edema and no tenderness.  Lymphadenopathy: No lymphadenopathy noted, cervical, inguinal. Neuro: Alert. Normal reflexes, muscle tone coordination. No cranial nerve deficit. Skin: Skin is warm  and dry. No rash noted. Not diaphoretic. No erythema. No pallor.  Psychiatric: Normal mood and affect. Behavior, judgment, thought content normal.   Lab Results  Component Value Date   WBC 4.5 04/13/2013   HGB 10.8* 04/13/2013   HCT 31.3* 04/13/2013   MCV 86.9 04/13/2013   PLT 183 04/13/2013   Lab Results  Component Value Date   CREATININE 0.74 05/31/2013   BUN 11 05/31/2013   NA 138 05/31/2013   K 4.1 05/31/2013   CL 103 05/31/2013   CO2 28 05/31/2013    Lab Results  Component Value Date   HGBA1C 7.0* 10/05/2012   Lipid Panel     Component Value Date/Time   CHOL 146 12/14/2012 1618   TRIG 129 12/14/2012 1618   HDL 47 12/14/2012 1618   CHOLHDL 3.1 12/14/2012 1618   VLDL 26 12/14/2012 1618  LDLCALC 73 12/14/2012 1618      Assessment and plan:   Patient Active Problem List   Diagnosis Date Noted  . DM (diabetes mellitus) type 2, uncontrolled, with ketoacidosis 06/22/2013  . Allergic rhinitis 06/22/2013  . Chronic pain syndrome 05/31/2013  . Peripheral edema 04/27/2013  . Unspecified essential hypertension 04/17/2013  . GERD (gastroesophageal reflux disease) 08/30/2012  . Diastolic CHF, chronic 08/30/2012  . Hypertension   . Hypothyroidism   . Hyperlipemia   . Diabetes mellitus type II, uncontrolled    DM (diabetes mellitus) type 2, uncontrolled, with ketoacidosis - Plan: Amb ref to Medical Nutrition Therapy-MNT  Unspecified essential hypertension - Plan: Amb ref to Medical Nutrition Therapy-MNT  Allergic rhinitis  Z pack take as directed  Loratadine 10 mg po daily  flonase NS 2 sprays per nostril once daily  Flu vaccine requested: pt doesn't have a high fever so will go ahead and give it today. Pt decided not to return to get it at another time.   Pt declined to go back on lasix or to try amlodipine for better bP control.    Encouraged fluids and rest.    RTC in 2 weeks for BP check and office visit  The patient was given clear instructions to go to ER or return  to medical center if symptoms don't improve, worsen or new problems develop.  The patient verbalized understanding.  The patient was told to call to get any lab results if not heard anything in the next week.    Rodney Langton, MD, CDE, FAAFP [facility] [place], [place]

## 2013-06-22 NOTE — Progress Notes (Signed)
PT HERE WITH C/O SINUS/ALLERGY SX THAT STARTED YESTERDAY SLIGHT COUGH,FACIAL PRESSURE RADIATING BEHIND EYES

## 2013-06-22 NOTE — Patient Instructions (Signed)
DASH Diet The DASH diet stands for "Dietary Approaches to Stop Hypertension." It is a healthy eating plan that has been shown to reduce high blood pressure (hypertension) in as little as 14 days, while also possibly providing other significant health benefits. These other health benefits include reducing the risk of breast cancer after menopause and reducing the risk of type 2 diabetes, heart disease, colon cancer, and stroke. Health benefits also include weight loss and slowing kidney failure in patients with chronic kidney disease.  DIET GUIDELINES  Limit salt (sodium). Your diet should contain less than 1500 mg of sodium daily.  Limit refined or processed carbohydrates. Your diet should include mostly whole grains. Desserts and added sugars should be used sparingly.  Include small amounts of heart-healthy fats. These types of fats include nuts, oils, and tub margarine. Limit saturated and trans fats. These fats have been shown to be harmful in the body. CHOOSING FOODS  The following food groups are based on a 2000 calorie diet. See your Registered Dietitian for individual calorie needs. Grains and Grain Products (6 to 8 servings daily)  Eat More Often: Whole-wheat bread, brown rice, whole-grain or wheat pasta, quinoa, popcorn without added fat or salt (air popped).  Eat Less Often: White bread, white pasta, white rice, cornbread. Vegetables (4 to 5 servings daily)  Eat More Often: Fresh, frozen, and canned vegetables. Vegetables may be raw, steamed, roasted, or grilled with a minimal amount of fat.  Eat Less Often/Avoid: Creamed or fried vegetables. Vegetables in a cheese sauce. Fruit (4 to 5 servings daily)  Eat More Often: All fresh, canned (in natural juice), or frozen fruits. Dried fruits without added sugar. One hundred percent fruit juice ( cup [237 mL] daily).  Eat Less Often: Dried fruits with added sugar. Canned fruit in light or heavy syrup. Lean Meats, Fish, and Poultry (2  servings or less daily. One serving is 3 to 4 oz [85-114 g]).  Eat More Often: Ninety percent or leaner ground beef, tenderloin, sirloin. Round cuts of beef, chicken breast, turkey breast. All fish. Grill, bake, or broil your meat. Nothing should be fried.  Eat Less Often/Avoid: Fatty cuts of meat, turkey, or chicken leg, thigh, or wing. Fried cuts of meat or fish. Dairy (2 to 3 servings)  Eat More Often: Low-fat or fat-free milk, low-fat plain or light yogurt, reduced-fat or part-skim cheese.  Eat Less Often/Avoid: Milk (whole, 2%).Whole milk yogurt. Full-fat cheeses. Nuts, Seeds, and Legumes (4 to 5 servings per week)  Eat More Often: All without added salt.  Eat Less Often/Avoid: Salted nuts and seeds, canned beans with added salt. Fats and Sweets (limited)  Eat More Often: Vegetable oils, tub margarines without trans fats, sugar-free gelatin. Mayonnaise and salad dressings.  Eat Less Often/Avoid: Coconut oils, palm oils, butter, stick margarine, cream, half and half, cookies, candy, pie. FOR MORE INFORMATION The Dash Diet Eating Plan: www.dashdiet.org Document Released: 08/27/2011 Document Revised: 11/30/2011 Document Reviewed: 08/27/2011 [employer] Patient Information 2014 [employer]. Hypertension Hypertension is another name for high blood pressure. High blood pressure may mean that your heart needs to work harder to pump blood. Blood pressure consists of two numbers, which includes a higher number over a lower number (example: 110/72). HOME CARE   Make lifestyle changes as told by your doctor. This may include weight loss and exercise.  Take your blood pressure medicine every day.  Limit how much salt you use.  Stop smoking if you smoke.  Do not use drugs.  Talk   to your doctor if you are using decongestants or birth control pills. These medicines might make blood pressure higher.  Females should not drink more than 1 alcoholic drink per day. Males should not drink  more than 2 alcoholic drinks per day.  See your doctor as told. GET HELP RIGHT AWAY IF:   You have a blood pressure reading with a top number of 180 or higher.  You get a very bad headache.  You get blurred or changing vision.  You feel confused.  You feel weak, numb, or faint.  You get chest or belly (abdominal) pain.  You throw up (vomit).  You cannot breathe very well. MAKE SURE YOU:   Understand these instructions.  Will watch your condition.  Will get help right away if you are not doing well or get worse. Document Released: 02/24/2008 Document Revised: 11/30/2011 Document Reviewed: 02/24/2008 [facility] Patient Information 2014 [place], [place]. Sinusitis Sinusitis is redness, soreness, and puffiness (inflammation) of the air pockets in the bones of your face (sinuses). The redness, soreness, and puffiness can cause air and mucus to get trapped in your sinuses. This can allow germs to grow and cause an infection.  HOME CARE   Drink enough fluids to keep your pee (urine) clear or pale yellow.  Use a humidifier in your home.  Run a hot shower to create steam in the bathroom. Sit in the bathroom with the door closed. Breathe in the steam 3 4 times a day.  Put a warm, moist washcloth on your face 3 4 times a day, or as told by your doctor.  Use salt water sprays (saline sprays) to wet the thick fluid in your nose. This can help the sinuses drain.  Only take medicine as told by your doctor. GET HELP RIGHT AWAY IF:   Your pain gets worse.  You have very bad headaches.  You are sick to your stomach (nauseous).  You throw up (vomit).  You are very sleepy (drowsy) all the time.  Your face is puffy (swollen).  Your vision changes.  You have a stiff neck.  You have trouble breathing. MAKE SURE YOU:   Understand these instructions.  Will watch your condition.  Will get help right away if you are not doing well or get worse. Document Released: 02/24/2008  Document Revised: 06/01/2012 Document Reviewed: 04/12/2012 [facility] Patient Information 2014 [place], [place]. Allergic Rhinitis Allergic rhinitis is when the mucous membranes in the nose respond to allergens. Allergens are particles in the air that cause your body to have an allergic reaction. This causes you to release allergic antibodies. Through a chain of events, these eventually cause you to release histamine into the blood stream (hence the use of antihistamines). Although meant to be protective to the body, it is this release that causes your discomfort, such as frequent sneezing, congestion and an itchy runny nose.  CAUSES  The pollen allergens may come from grasses, trees, and weeds. This is seasonal allergic rhinitis, or "hay fever." Other allergens cause year-round allergic rhinitis (perennial allergic rhinitis) such as house dust mite allergen, pet dander and mold spores.  SYMPTOMS   Nasal stuffiness (congestion).  Runny, itchy nose with sneezing and tearing of the eyes.  There is often an itching of the mouth, eyes and ears. It cannot be cured, but it can be controlled with medications. DIAGNOSIS  If you are unable to determine the offending allergen, skin or blood testing may find it. TREATMENT   Avoid the allergen.  Medications  and allergy shots (immunotherapy) can help.  Hay fever may often be treated with antihistamines in pill or nasal spray forms. Antihistamines block the effects of histamine. There are over-the-counter medicines that may help with nasal congestion and swelling around the eyes. Check with your caregiver before taking or giving this medicine. If the treatment above does not work, there are many new medications your caregiver can prescribe. Stronger medications may be used if initial measures are ineffective. Desensitizing injections can be used if medications and avoidance fails. Desensitization is when a patient is given ongoing shots until the body becomes  less sensitive to the allergen. Make sure you follow up with your caregiver if problems continue. SEEK MEDICAL CARE IF:   You develop fever (more than 100.5 F (38.1 C).  You develop a cough that does not stop easily (persistent).  You have shortness of breath.  You start wheezing.  Symptoms interfere with normal daily activities. Document Released: 06/02/2001 Document Revised: 11/30/2011 Document Reviewed: 12/12/2008 [facility] Patient Information 2014 [place], [place].

## 2013-07-12 ENCOUNTER — Ambulatory Visit: Payer: No Typology Code available for payment source

## 2013-07-27 ENCOUNTER — Encounter: Payer: Self-pay | Admitting: Internal Medicine

## 2013-07-27 ENCOUNTER — Ambulatory Visit: Payer: No Typology Code available for payment source | Attending: Internal Medicine | Admitting: Internal Medicine

## 2013-07-27 VITALS — BP 145/83 | HR 104 | Temp 97.8°F | Resp 18 | Wt 260.0 lb

## 2013-07-27 DIAGNOSIS — E1165 Type 2 diabetes mellitus with hyperglycemia: Secondary | ICD-10-CM

## 2013-07-27 DIAGNOSIS — E119 Type 2 diabetes mellitus without complications: Secondary | ICD-10-CM

## 2013-07-27 DIAGNOSIS — E785 Hyperlipidemia, unspecified: Secondary | ICD-10-CM

## 2013-07-27 DIAGNOSIS — IMO0002 Reserved for concepts with insufficient information to code with codable children: Secondary | ICD-10-CM

## 2013-07-27 DIAGNOSIS — I10 Essential (primary) hypertension: Secondary | ICD-10-CM

## 2013-07-27 DIAGNOSIS — K219 Gastro-esophageal reflux disease without esophagitis: Secondary | ICD-10-CM

## 2013-07-27 DIAGNOSIS — G894 Chronic pain syndrome: Secondary | ICD-10-CM

## 2013-07-27 DIAGNOSIS — J309 Allergic rhinitis, unspecified: Secondary | ICD-10-CM

## 2013-07-27 DIAGNOSIS — E039 Hypothyroidism, unspecified: Secondary | ICD-10-CM

## 2013-07-27 LAB — POCT GLYCOSYLATED HEMOGLOBIN (HGB A1C): Hemoglobin A1C: 6.3

## 2013-07-27 MED ORDER — LEVOTHYROXINE SODIUM 100 MCG PO TABS: 100.0000 ug | ORAL_TABLET | Freq: Every day | ORAL | Status: DC

## 2013-07-27 MED ORDER — PANTOPRAZOLE SODIUM 40 MG PO TBEC: 40.0000 mg | DELAYED_RELEASE_TABLET | Freq: Every day | ORAL | Status: DC

## 2013-07-27 MED ORDER — FLUTICASONE PROPIONATE 50 MCG/ACT NA SUSP: 2.0000 | Freq: Every day | NASAL | Status: DC

## 2013-07-27 MED ORDER — ISOSORBIDE MONONITRATE ER 30 MG PO TB24: 30.0000 mg | ORAL_TABLET | Freq: Every day | ORAL | Status: DC

## 2013-07-27 MED ORDER — LISINOPRIL 40 MG PO TABS: 40.0000 mg | ORAL_TABLET | Freq: Every day | ORAL | Status: DC

## 2013-07-27 MED ORDER — DIAZEPAM 5 MG PO TABS: 5.0000 mg | ORAL_TABLET | Freq: Two times a day (BID) | ORAL | Status: DC | PRN

## 2013-07-27 MED ORDER — CHOLINE FENOFIBRATE 135 MG PO CPDR: 135.0000 mg | DELAYED_RELEASE_CAPSULE | Freq: Every day | ORAL | Status: DC

## 2013-07-27 MED ORDER — ACETAMINOPHEN-CODEINE #3 300-30 MG PO TABS: 2.0000 | ORAL_TABLET | ORAL | Status: DC | PRN

## 2013-07-27 MED ORDER — TRAMADOL HCL 50 MG PO TABS: 100.0000 mg | ORAL_TABLET | Freq: Three times a day (TID) | ORAL | Status: DC | PRN

## 2013-07-27 MED ORDER — GABAPENTIN 300 MG PO CAPS: 300.0000 mg | ORAL_CAPSULE | Freq: Four times a day (QID) | ORAL | Status: DC

## 2013-07-27 MED ORDER — BUTALBITAL-APAP-CAFFEINE 50-325-40 MG PO TABS: 1.0000 | ORAL_TABLET | Freq: Four times a day (QID) | ORAL | Status: DC | PRN

## 2013-07-27 MED ORDER — SIMVASTATIN 20 MG PO TABS: 20.0000 mg | ORAL_TABLET | Freq: Every day | ORAL | Status: DC

## 2013-07-27 NOTE — Patient Instructions (Signed)

## 2013-07-27 NOTE — Progress Notes (Signed)
Pt is here for a f/u and needing refills on meds CBG = 236 non fasting Voices no new concerns Alert w/no signs of acute distress.

## 2013-07-27 NOTE — Progress Notes (Signed)
[name], female   DOB: 07/15/1951, 62 y.o.   MRN: [id number]  CC: follow up  HPI: 62 year old female with past medical history of hypertension, diabetes, migraine headache, hypothyroidism who presented to clinic for follow up. Patient has chronic headaches and has headache at this time. [name], no shortness of breath. No abdominal pain, nausea or vomiting.  Allergies  Allergen Reactions  . Sulfa Antibiotics Shortness Of Breath  . Amoxicillin-Pot Clavulanate     Combination of medications tear lining of stomach   Past Medical History  Diagnosis Date  . Diabetes mellitus type II, [name]    Current Outpatient Prescriptions on File Prior to Visit  Medication Sig Dispense Refill  . Docusate Sodium (DSS) 100 MG CAPS Take 100 mg by mouth 2 (two) times daily.  30 each  3  . insulin aspart (NOVOLOG) 100 UNIT/ML injection Inject 20 Units into the skin 3 (three) times daily before meals.  1 vial  12  . insulin glargine (LANTUS) 100 UNIT/ML injection Inject 0.6 mLs (60 Units total) into the skin at bedtime.  10 mL  12  . loratadine (CLARITIN) 10 MG tablet Take 1 tablet (10 mg total) by mouth daily.  30 tablet  3  . methocarbamol (ROBAXIN) 500 MG tablet Take 1 tablet (500 mg total) by mouth 4 (four) times daily.  30 tablet  2  . metoprolol succinate (TOPROL-XL) 50 MG 24 hr tablet Take 2 tablets (100 mg total) by mouth daily.  60 tablet  3  . Multiple Vitamins-Minerals (EYE VITAMINS) CAPS Take 1 capsule by mouth daily.      . nitroGLYCERIN (NITROSTAT) 0.4 MG SL tablet Place 0.4 mg under the tongue every 5 (five) minutes as needed for chest pain.      Marland Kitchen oxyCODONE-acetaminophen (PERCOCET/ROXICET) 5-325 MG per tablet Take 1 tablet by mouth every 8 (eight) hours as needed for pain.  15 tablet  0  . [DISCONTINUED] fenofibrate (TRICOR) 145 MG  tablet Take 1 tablet (145 mg total) by mouth daily.  30 tablet  2  . [DISCONTINUED] insulin detemir (LEVEMIR) 100 UNIT/ML injection Inject 60 Units into the skin at bedtime.  10 mL  1  . [DISCONTINUED] metoprolol (LOPRESSOR) 50 MG tablet Take 1 tablet (50 mg total) by mouth 2 (two) times daily.  60 tablet  2   No current facility-administered medications on file prior to visit.   HSV in sister.  History   Social History  . Marital Status: Divorced    Spouse Name: N/A    Number of Children: N/A  . Years of Education: N/A   Occupational History  . Not on file.   Social History Main Topics  . Smoking status: Never Smoker   . Smokeless tobacco: Never Used  . Alcohol Use: No  . Drug Use: No  . Sexual Activity:    Other Topics Concern  . Not on file   Social History Narrative  . No narrative on file    Review of Systems  Constitutional: Negative for fever, chills, diaphoresis, activity change, appetite change and fatigue.  HENT: Negative for ear pain, nosebleeds, congestion, facial swelling, rhinorrhea, neck pain, neck stiffness and ear discharge.   Eyes: Negative for pain, discharge, redness, itching and visual disturbance.  Respiratory: Negative for cough,  choking, chest tightness, shortness of breath, wheezing and stridor.   Cardiovascular: Negative for chest pain, palpitations and leg swelling.  Gastrointestinal: Negative for abdominal distention.  Genitourinary: Negative for dysuria, urgency, frequency, hematuria, flank pain, decreased urine volume, difficulty urinating and dyspareunia.  Musculoskeletal: Negative for back pain, joint swelling, arthralgias and gait problem.  Neurological: Negative for dizziness, tremors, seizures, syncope, facial asymmetry, speech difficulty, weakness, light-headedness, numbness and headaches.  Hematological: Negative for adenopathy. Does not bruise/bleed easily.  Psychiatric/Behavioral: Negative for hallucinations, behavioral problems,  confusion, dysphoric mood, decreased concentration and agitation.    Objective:   Filed Vitals:   07/27/13 1143  BP: 145/83  Pulse: 104  Temp: 97.8 F (36.6 C)  Resp: 18    Physical Exam  Constitutional: Appears well-developed and well-nourished. No distress.  HENT: Normocephalic. External right and left ear normal. Oropharynx is clear and moist.  Eyes: Conjunctivae and EOM are normal. PERRLA, no scleral icterus.  Neck: Normal ROM. Neck supple. No JVD. No tracheal deviation. No thyromegaly.  CVS: RRR, S1/S2 +, no murmurs, no gallops, no carotid bruit.  Pulmonary: Effort and breath sounds normal, no stridor, rhonchi, wheezes, rales.  Abdominal: Soft. BS +,  no distension, tenderness, rebound or guarding.  Musculoskeletal: Normal range of motion. No edema and no tenderness.  Lymphadenopathy: No lymphadenopathy noted, cervical, inguinal. Neuro: Alert. Normal reflexes, muscle tone coordination. No cranial nerve deficit. Skin: Skin is warm and dry. No rash noted. Not diaphoretic. No erythema. No pallor.  Psychiatric: Normal mood and affect. Behavior, judgment, thought content normal.   Lab Results  Component Value Date   WBC 4.5 04/13/2013   HGB 10.8* 04/13/2013   HCT 31.3* 04/13/2013   MCV 86.9 04/13/2013   PLT 183 04/13/2013   Lab Results  Component Value Date   CREATININE 0.74 05/31/2013   BUN 11 05/31/2013   NA 138 05/31/2013   K 4.1 05/31/2013   CL 103 05/31/2013   CO2 28 05/31/2013    Lab Results  Component Value Date   HGBA1C 7.0* 10/05/2012   Lipid Panel     Component Value Date/Time   CHOL 146 12/14/2012 1618   TRIG 129 12/14/2012 1618   HDL 47 12/14/2012 1618   CHOLHDL 3.1 12/14/2012 1618   VLDL 26 12/14/2012 1618   LDLCALC 73 12/14/2012 1618       Assessment and plan:   Patient Active Problem List   Diagnosis Date Noted  . Dyslipidemia 07/27/2013    Priority: Medium - continue fenofibrate  . Allergic rhinitis 06/22/2013    Priority: Medium - continue flonase   . Chronic pain syndrome 05/31/2013    Priority: Medium - tylenol #3 prescribed; awaiting pain management clinic appt  . GERD (gastroesophageal reflux disease) 08/30/2012    Priority: Medium  . Hypertension     Priority: Medium - We have discussed target BP range - I have advised pt to check BP regularly and to call us back if the numbers are higher than 140/90 - discussed the importance of compliance with medical therapy and diet  - continue toprol XL, lisinopril - add imdur 30 mg daily  . Hypothyroidism     Priority: Medium - contineu levothyroxine   . Diabetes mellitus type II, uncontrolled     Priority: Medium - check A1c today - continue current insulin regimen

## 2013-07-28 ENCOUNTER — Telehealth: Payer: Self-pay

## 2013-07-28 NOTE — Telephone Encounter (Signed)
Health dept called  Changed flonase to nasonex trichlor to lofibra They wanted to change protonix to bid but there was not  Doctor on hand to authorize

## 2013-08-03 ENCOUNTER — Ambulatory Visit: Payer: No Typology Code available for payment source

## 2013-08-16 ENCOUNTER — Ambulatory Visit: Payer: No Typology Code available for payment source

## 2013-08-28 ENCOUNTER — Ambulatory Visit: Payer: No Typology Code available for payment source | Attending: Internal Medicine

## 2013-09-11 ENCOUNTER — Telehealth: Payer: Self-pay | Admitting: Emergency Medicine

## 2013-09-11 NOTE — Telephone Encounter (Signed)
Spoke with Anita James regarding sx n/v with insomnia since last Friday. States she feels very depressed lately.last CBG yesterday 124 Denies diarrhea with sx or pain. Informed pt to take prn Phenergan and come in tomorrow @ 230 pm with /Dr. Hyman Hopes Verbalized understanding.

## 2013-09-12 ENCOUNTER — Encounter: Payer: Self-pay | Admitting: Internal Medicine

## 2013-09-12 ENCOUNTER — Ambulatory Visit: Payer: No Typology Code available for payment source | Attending: Internal Medicine | Admitting: Internal Medicine

## 2013-09-12 VITALS — BP 129/80 | HR 119 | Temp 98.7°F | Resp 14 | Ht 66.0 in | Wt 256.6 lb

## 2013-09-12 DIAGNOSIS — I10 Essential (primary) hypertension: Secondary | ICD-10-CM | POA: Insufficient documentation

## 2013-09-12 DIAGNOSIS — I509 Heart failure, unspecified: Secondary | ICD-10-CM | POA: Insufficient documentation

## 2013-09-12 DIAGNOSIS — I5032 Chronic diastolic (congestive) heart failure: Secondary | ICD-10-CM | POA: Insufficient documentation

## 2013-09-12 DIAGNOSIS — I5030 Unspecified diastolic (congestive) heart failure: Secondary | ICD-10-CM | POA: Insufficient documentation

## 2013-09-12 DIAGNOSIS — G894 Chronic pain syndrome: Secondary | ICD-10-CM

## 2013-09-12 DIAGNOSIS — F329 Major depressive disorder, single episode, unspecified: Secondary | ICD-10-CM | POA: Insufficient documentation

## 2013-09-12 DIAGNOSIS — E785 Hyperlipidemia, unspecified: Secondary | ICD-10-CM | POA: Insufficient documentation

## 2013-09-12 MED ORDER — SERTRALINE HCL 50 MG PO TABS
50.0000 mg | ORAL_TABLET | Freq: Every day | ORAL | Status: DC
Start: 2013-09-12 — End: 2014-05-18

## 2013-09-12 NOTE — Patient Instructions (Signed)
Major Depressive Disorder °Major depressive disorder (MDD) is a mental illness. It also may be called clinical depression or unipolar depression. MDD usually causes feelings of sadness, hopelessness, or helplessness. Some people with MDD do not feel particularly sad but lose interest in doing things they used to enjoy (anhedonia). MDD also can cause physical symptoms. It can interfere with work, school, relationships, and other normal everyday activities. MDD varies in severity but is longer lasting and more serious than the sadness we all feel from time to time in our lives. °MDD often is triggered by stressful life events or major life changes. Examples of these triggers include divorce, loss of your job or home, a move, and the death of a family member or close friend. Sometimes MDD occurs for no obvious reason at all. People who have family members with MDD or bipolar disorder are at higher risk for developing MDD, with or without life stressors. MDD can occur at any age. It may occur just once in your life (single episode MDD). It may occur multiple times (recurrent MDD). °SYMPTOMS °People with MDD have either anhedonia or depressed mood on nearly a daily basis for at least 2 weeks or longer. Symptoms of depressed mood include: °· Feelings of sadness (blue or down in the dumps) or emptiness. °· Feelings of hopelessness or helplessness. °· Tearfulness or episodes of crying (may be observed by others). °· Irritability (children and adolescents). °In addition to depressed mood or anhedonia or both, people with MDD have at least four of the following symptoms: °· Difficulty sleeping or sleeping too much.   °· Significant change (increase or decrease) in appetite or weight.   °· Lack of energy or motivation. °· Feelings of guilt and worthlessness.   °· Difficulty concentrating, remembering, or making decisions. °· Unusually slow movement (psychomotor retardation) or restlessness (as observed by others).    °· Recurrent wishes for death, recurrent thoughts of self-harm (suicide), or a suicide attempt. °People with MDD commonly have persistent negative thoughts about themselves, other people, and the world. People with severe MDD may experience distorted beliefs or perceptions about the world (psychotic delusions). They also may see or hear things that are not real (psychotic hallucinations). °DIAGNOSIS °MDD is diagnosed through an assessment by your caregiver. Your caregiver will ask about aspects of your daily life, such as mood, sleep, and appetite, to see if you have the diagnostic symptoms of MDD. Your caregiver may ask about your medical history and use of alcohol or drugs, including prescription medications. Your caregiver also may do a physical exam and blood work. This is because certain medical conditions and the use of certain substances can cause MDD-like symptoms (secondary depression). Your caregiver also may refer you to a mental health specialist for further evaluation and treatment. °TREATMENT °It is important to recognize the symptoms of MDD and seek treatment. The following treatments can be prescribed for MDD:   °· Medication Antidepressant medications usually are prescribed. Antidepressant medications are thought to correct chemical imbalances in the brain that are commonly associated with MDD. Other types of medication may be added if MDD symptoms do not respond to antidepressant medications alone or if psychotic delusions or hallucinations occur. °· Talk therapy Talk therapy can be helpful in treating MDD by providing support, education, and guidance. Certain types of talk therapy also can help with negative thinking (cognitive behavioral therapy) and with relationship issues that trigger MDD (interpersonal therapy). °A mental health specialist can help determine which treatment is best for you. Most people with MDD do well with a   combination of medication and talk therapy. Treatments involving  electrical stimulation of the brain can be used in situations with extremely severe symptoms or when medication and talk therapy do not work over time. These treatments include electroconvulsive therapy, transcranial magnetic stimulation, and vagal nerve stimulation. °Document Released: 01/02/2013 Document Reviewed: 01/02/2013 °[employer]® Patient Information ©2014 [employer]. ° °

## 2013-09-12 NOTE — Progress Notes (Signed)
[name], female   DOB: [birth year], 62 y.o.   MRN: [id number] Patient Demographics  Anita James, is a 62 y.o. female  [id number]  [id number]  DOB - Dec 15, 1950  Chief Complaint  [name]        Subjective:   Anita James is a 62 y.o. female here today for a follow up visit. Patient's major complaint today is excessive crying including at work and at home. She has had yes to being depressed, she said that a lot is going on in her life. She is tearful during this conversation, she lacks interest in the usual things that she likes to do, she does not sleep well, she lacks energy. She has history of hypertension, diabetes, hypothyroidism, hyperlipidemia and anxiety. She is on several medications as listed below.  [name], [name], [name], [name], [name]  ALLERGIES: Allergies  Allergen Reactions  . Sulfa Antibiotics Shortness Of Breath  . Amoxicillin-Pot Clavulanate     Combination of medications tear lining of stomach    PAST MEDICAL HISTORY: Past Medical History  Diagnosis Date  . Diabetes mellitus type II, [name]     MEDICATIONS AT HOME: Prior to Admission medications   Medication Sig Start Date End Date Taking? Authorizing Provider  acetaminophen-codeine (TYLENOL #3) 300-30 MG per tablet Take 2 tablets by mouth every 4 (four) hours as needed for moderate pain. 07/27/13  Yes Alison Murray, MD  butalbital-acetaminophen-caffeine (FIORICET) [phone] MG per tablet Take 1-2 tablets by mouth every 6 (six) hours as needed for headache or migraine. 07/27/13 07/27/14 Yes Alison Murray, MD  Choline Fenofibrate 135 MG capsule Take 1 capsule (135 mg total) by mouth daily. 07/27/13  Yes Alison Murray, MD  diazepam (VALIUM) 5 MG  tablet Take 1 tablet (5 mg total) by mouth every 12 (twelve) hours as needed for anxiety. 07/27/13  Yes Alison Murray, MD  Docusate Sodium (DSS) 100 MG CAPS Take 100 mg by mouth 2 (two) times daily. 01/18/13  Yes Edsel Petrin, DO  fluticasone (FLONASE) 50 MCG/ACT nasal spray Place 2 sprays into both nostrils daily. 07/27/13  Yes Alison Murray, MD  gabapentin (NEURONTIN) 300 MG capsule Take 1 capsule (300 mg total) by mouth 4 (four) times daily. 07/27/13  Yes Alison Murray, MD  insulin aspart (NOVOLOG) 100 UNIT/ML injection Inject 20 Units into the skin 3 (three) times daily before meals. 01/18/13  Yes Edsel Petrin, DO  insulin glargine (LANTUS) 100 UNIT/ML injection Inject 0.6 mLs (60 Units total) into the skin at bedtime. 01/18/13  Yes Edsel Petrin, DO  isosorbide mononitrate (IMDUR) 30 MG 24 hr tablet Take 1 tablet (30 mg total) by mouth daily. 07/27/13  Yes Alison Murray, MD  levothyroxine (SYNTHROID, LEVOTHROID) 100 MCG tablet Take 1 tablet (100 mcg total) by mouth daily. 07/27/13  Yes Alison Murray, MD  lisinopril (PRINIVIL,ZESTRIL) 40 MG tablet Take 1 tablet (40 mg total) by mouth daily. 07/27/13  Yes Alison Murray, MD  loratadine (CLARITIN) 10 MG tablet Take 1 tablet (10 mg total) by mouth daily. 06/22/13  Yes Clanford Cyndie Mull, MD  methocarbamol (ROBAXIN) 500 MG tablet Take 1 tablet (500  mg total) by mouth 4 (four) times daily. 03/09/13  Yes Alison Murray, MD  metoprolol succinate (TOPROL-XL) 50 MG 24 hr tablet Take 2 tablets (100 mg total) by mouth daily. 04/27/13  Yes Ripudeep Jenna Luo, MD  Multiple Vitamins-Minerals (EYE VITAMINS) CAPS Take 1 capsule by mouth daily. 08/11/12  Yes Leroy Sea, MD  nitroGLYCERIN (NITROSTAT) 0.4 MG SL tablet Place 0.4 mg under the tongue every 5 (five) minutes as needed for chest pain. 10/06/12  Yes Leroy Sea, MD  pantoprazole (PROTONIX) 40 MG tablet Take 1 tablet (40 mg total) by mouth daily. 07/27/13  Yes Alison Murray, MD  simvastatin  (ZOCOR) 20 MG tablet Take 1 tablet (20 mg total) by mouth at bedtime. 07/27/13  Yes Alison Murray, MD  traMADol (ULTRAM) 50 MG tablet Take 2 tablets (100 mg total) by mouth every 8 (eight) hours as needed. 07/27/13  Yes Alison Murray, MD  oxyCODONE-acetaminophen (PERCOCET/ROXICET) 5-325 MG per tablet Take 1 tablet by mouth every 8 (eight) hours as needed for pain. 04/15/13   Penny Pia, MD  sertraline (ZOLOFT) 50 MG tablet Take 1 tablet (50 mg total) by mouth daily. 09/12/13   Jeanann Lewandowsky, MD     Objective:   Filed Vitals:   09/12/13 1430  BP: 129/80  Pulse: 119  Temp: 98.7 F (37.1 C)  TempSrc: Oral  Resp: 14  Height: 5\' 6"  (1.676 m)  Weight: 256 lb 9.6 oz (116.393 kg)  SpO2: 96%    Exam General appearance : Awake, alert, not in any distress. Speech Clear. Not toxic looking, obvious HEENT: Atraumatic and Normocephalic, pupils equally reactive to light and accomodation Neck: supple, no JVD. No cervical lymphadenopathy.  Chest:Good air entry bilaterally, no added sounds  CVS: S1 S2 regular, no murmurs.  Abdomen: Bowel sounds present, Non tender and not distended with no gaurding, rigidity or rebound. Extremities: B/L Lower Ext shows no edema, both legs are warm to touch Neurology: Awake alert, and oriented X 3, CN II-XII intact, Non focal. Tearful Skin:No Rash Wounds:N/A   Data Review   CBC No results found for this basename: WBC, HGB, HCT, PLT, MCV, MCH, MCHC, RDW, NEUTRABS, LYMPHSABS, MONOABS, EOSABS, BASOSABS, BANDABS, BANDSABD,  in the last 168 hours  Chemistries   No results found for this basename: NA, K, CL, CO2, GLUCOSE, BUN, CREATININE, GFRCGP, CALCIUM, MG, AST, ALT, ALKPHOS, BILITOT,  in the last 168 hours ------------------------------------------------------------------------------------------------------------------ No results found for this basename: HGBA1C,  in the last 72  hours ------------------------------------------------------------------------------------------------------------------ No results found for this basename: CHOL, HDL, LDLCALC, TRIG, CHOLHDL, LDLDIRECT,  in the last 72 hours ------------------------------------------------------------------------------------------------------------------ No results found for this basename: TSH, T4TOTAL, FREET3, T3FREE, THYROIDAB,  in the last 72 hours ------------------------------------------------------------------------------------------------------------------ No results found for this basename: VITAMINB12, FOLATE, FERRITIN, TIBC, IRON, RETICCTPCT,  in the last 72 hours  Coagulation profile  No results found for this basename: INR, PROTIME,  in the last 168 hours    Assessment & Plan   1. Hyperlipemia Continue present medications Patient was counseled extensively about nutrition and exercise For repeat lab test in February  2. Diastolic CHF, chronic Continue present medications  3. Chronic pain syndrome Continue present pain medication  4. Hypertension Continue present regimen  5. Major depression Patient extensively counseled, however labor resources provided for support socially - sertraline (ZOLOFT) 50 MG tablet; Take 1 tablet (50 mg total) by mouth daily.  Dispense: 30 tablet; Refill: 3 -Patient needs a proper psychiatry evaluation and for that  reason I will refer her to a psychiatrist. - Ambulatory referral to Psychiatry  Follow up in 3 months or when necessary   The patient was given clear instructions to go to ER or return to medical center if symptoms don't improve, worsen or new problems develop. The patient verbalized understanding. The patient was told to call to get lab results if they haven't heard anything in the next week.    Jeanann Lewandowsky, MD, MHA, FACP, FAAP [facility] [place], [place] [phone]   09/12/2013, 2:56 PM

## 2013-09-12 NOTE — Progress Notes (Signed)
Pt is here for a f/u.  Complains of going through depression, blood sugar level up and down, BP levels had been up and down. Requests thyriod panel, kidney testing and other blood work. Pt requests to see Dr. Hyman Hopes.

## 2013-09-19 ENCOUNTER — Inpatient Hospital Stay (HOSPITAL_COMMUNITY)
Admission: AD | Admit: 2013-09-19 | Discharge: 2013-09-19 | Disposition: A | Discharge status: Home / Self Care | Payer: No Typology Code available for payment source | Source: Ambulatory Visit | Attending: Obstetrics & Gynecology | Admitting: Obstetrics & Gynecology

## 2013-09-19 ENCOUNTER — Encounter (HOSPITAL_COMMUNITY): Payer: Self-pay | Admitting: *Deleted

## 2013-09-19 DIAGNOSIS — N390 Urinary tract infection, site not specified: Secondary | ICD-10-CM | POA: Insufficient documentation

## 2013-09-19 DIAGNOSIS — R300 Dysuria: Secondary | ICD-10-CM | POA: Insufficient documentation

## 2013-09-19 HISTORY — DX: Chronic kidney disease, unspecified: N18.9

## 2013-09-19 LAB — URINALYSIS, ROUTINE W REFLEX MICROSCOPIC
Glucose, UA: NEGATIVE mg/dL
pH: 5.5 (ref 5.0–8.0)

## 2013-09-19 LAB — URINE MICROSCOPIC-ADD ON

## 2013-09-19 MED ORDER — CIPROFLOXACIN HCL 500 MG PO TABS: 500.0000 mg | ORAL_TABLET | Freq: Two times a day (BID) | ORAL | Status: DC

## 2013-09-19 NOTE — MAU Provider Note (Signed)
History     CSN: [id number]  Arrival date and time: 09/19/13 1712   None     Chief Complaint  Patient presents with  . Dysuria   HPI  Anita James is a 62 y.o. who presents today with painful urination and pressure with urination x 1 day. She denies any fever, nausea, vomiting.   RN Note:  Pressure when tries to urinate. Painful with urination. Went by dr's unable to get in    Past Medical History  Diagnosis Date  . Diabetes mellitus type II, [name]   . Chronic kidney disease 03/2013    failure due to meds    Past Surgical History  Procedure Laterality Date  . Cesarean section    . Knee surgery    . Thyroid surgery      Family History  Problem Relation Age of Onset  . Cancer Mother     cervical- mets    History  Substance Use Topics  . Smoking status: Never Smoker   . Smokeless tobacco: Never Used  . Alcohol Use: No    Allergies:  Allergies  Allergen Reactions  . Sulfa Antibiotics Shortness Of Breath  . Amoxicillin-Pot Clavulanate Nausea And Vomiting    Combination of medications tear lining of stomach    Prescriptions prior to admission  Medication Sig Dispense Refill  . acetaminophen-codeine (TYLENOL #3) 300-30 MG per tablet Take 2 tablets by mouth every 4 (four) hours as needed for moderate pain.  60 tablet  0  . butalbital-acetaminophen-caffeine (FIORICET) 50-325-40 MG per tablet Take 1-2 tablets by mouth every 6 (six) hours as needed for headache or migraine.  30 tablet  0  . Choline Fenofibrate 135 MG capsule Take 1 capsule (135 mg total) by mouth daily.  30 capsule  5  . diazepam (VALIUM) 5 MG tablet Take 1 tablet (5 mg total) by mouth every 12 (twelve) hours as needed for anxiety.  60 tablet  1  . fluticasone (FLONASE) 50 MCG/ACT nasal spray Place 2 sprays into both nostrils daily.  16 g  5  .  gabapentin (NEURONTIN) 300 MG capsule Take 1 capsule (300 mg total) by mouth 4 (four) times daily.  120 capsule  5  . insulin aspart (NOVOLOG) 100 UNIT/ML injection Inject 30 Units into the skin 3 (three) times daily before meals.      . insulin glargine (LANTUS) 100 UNIT/ML injection Inject 0.6 mLs (60 Units total) into the skin at bedtime.  10 mL  12  . isosorbide mononitrate (IMDUR) 30 MG 24 hr tablet Take 1 tablet (30 mg total) by mouth daily.  30 tablet  6  . levothyroxine (SYNTHROID, LEVOTHROID) 100 MCG tablet Take 1 tablet (100 mcg total) by mouth daily.  30 tablet  5  . lisinopril (PRINIVIL,ZESTRIL) 40 MG tablet Take 1 tablet (40 mg total) by mouth daily.  30 tablet  5  . loratadine (CLARITIN) 10 MG tablet Take 1 tablet (10 mg total) by mouth daily.  30 tablet  3  . methocarbamol (ROBAXIN) 500 MG tablet Take 1 tablet (500 mg total) by mouth 4 (four) times daily.  30 tablet  2  . metoprolol succinate (TOPROL-XL) 50 MG 24 hr tablet Take 2 tablets (100 mg total) by mouth daily.  60 tablet  3  . Multiple Vitamins-Minerals (  EYE VITAMINS) CAPS Take 1 capsule by mouth daily.      Marland Kitchen oxyCODONE-acetaminophen (PERCOCET/ROXICET) 5-325 MG per tablet Take 1 tablet by mouth every 8 (eight) hours as needed for pain.  15 tablet  0  . pantoprazole (PROTONIX) 40 MG tablet Take 1 tablet (40 mg total) by mouth daily.  30 tablet  5  . sertraline (ZOLOFT) 50 MG tablet Take 1 tablet (50 mg total) by mouth daily.  30 tablet  3  . simvastatin (ZOCOR) 20 MG tablet Take 1 tablet (20 mg total) by mouth at bedtime.  30 tablet  5  . traMADol (ULTRAM) 50 MG tablet Take 2 tablets (100 mg total) by mouth every 8 (eight) hours as needed.  60 tablet  3  . nitroGLYCERIN (NITROSTAT) 0.4 MG SL tablet Place 0.4 mg under the tongue every 5 (five) minutes as needed for chest pain.        ROS Physical Exam   Blood pressure 147/81, pulse 90, temperature 98.9 F (37.2 C), temperature source Oral, resp. rate 18.  Physical Exam   Nursing note and vitals reviewed. Constitutional: She is oriented to person, place, and time. She appears well-developed and well-nourished. No distress.  Cardiovascular: Normal rate.   Respiratory: Effort normal.  GI: Soft. There is tenderness (suprapubic).  Neurological: She is alert and oriented to person, place, and time.  Skin: Skin is warm and dry.  Psychiatric: She has a normal mood and affect.    MAU Course  Procedures  Results for orders placed during the hospital encounter of 09/19/13 (from the past 24 hour(s))  URINALYSIS, ROUTINE W REFLEX MICROSCOPIC     Status: Abnormal   Collection Time    09/19/13  5:35 PM      Result Value Range   Color, Urine YELLOW  YELLOW   APPearance HAZY (*) CLEAR   Specific Gravity, Urine >1.030 (*) 1.005 - 1.030   pH 5.5  5.0 - 8.0   Glucose, UA NEGATIVE  NEGATIVE mg/dL   Hgb urine dipstick MODERATE (*) NEGATIVE   Bilirubin Urine NEGATIVE  NEGATIVE   Ketones, ur NEGATIVE  NEGATIVE mg/dL   Protein, ur 454 (*) NEGATIVE mg/dL   Urobilinogen, UA 0.2  0.0 - 1.0 mg/dL   Nitrite NEGATIVE  NEGATIVE   Leukocytes, UA SMALL (*) NEGATIVE  URINE MICROSCOPIC-ADD ON     Status: Abnormal   Collection Time    09/19/13  5:35 PM      Result Value Range   Squamous Epithelial / LPF FEW (*) RARE   WBC, UA 21-50  <3 WBC/hpf   RBC / HPF 3-6  <3 RBC/hpf   Bacteria, UA RARE  RARE   Urine-Other MUCOUS PRESENT       Assessment and Plan   1. UTI (lower urinary tract infection)    Urine culture pending RX: Cipro 500mg  BID X 5   Follow-up Information   Follow up with [facility]    . (If symptoms worsen)    Contact information:   [address] [place] [address] [phone]       Tawnya Crook 09/19/2013, 5:48 PM

## 2013-09-19 NOTE — MAU Note (Signed)
Pressure when tries to urinate. Painful with urination. Went by dr's unable to get in

## 2013-09-21 LAB — URINE CULTURE: Colony Count: >=100000

## 2013-09-26 ENCOUNTER — Ambulatory Visit: Payer: No Typology Code available for payment source | Attending: Internal Medicine | Admitting: Internal Medicine

## 2013-09-26 ENCOUNTER — Ambulatory Visit: Payer: Self-pay

## 2013-09-26 ENCOUNTER — Encounter: Payer: Self-pay | Admitting: Internal Medicine

## 2013-09-26 ENCOUNTER — Telehealth: Payer: Self-pay | Admitting: Emergency Medicine

## 2013-09-26 ENCOUNTER — Ambulatory Visit: Payer: No Typology Code available for payment source | Attending: Internal Medicine

## 2013-09-26 VITALS — BP 183/103 | HR 83 | Temp 98.7°F | Resp 14 | Ht 67.0 in | Wt 256.4 lb

## 2013-09-26 DIAGNOSIS — N390 Urinary tract infection, site not specified: Secondary | ICD-10-CM | POA: Insufficient documentation

## 2013-09-26 DIAGNOSIS — IMO0001 Reserved for inherently not codable concepts without codable children: Secondary | ICD-10-CM | POA: Insufficient documentation

## 2013-09-26 DIAGNOSIS — I10 Essential (primary) hypertension: Secondary | ICD-10-CM | POA: Insufficient documentation

## 2013-09-26 DIAGNOSIS — IMO0002 Reserved for concepts with insufficient information to code with codable children: Secondary | ICD-10-CM

## 2013-09-26 DIAGNOSIS — G894 Chronic pain syndrome: Secondary | ICD-10-CM

## 2013-09-26 DIAGNOSIS — E1165 Type 2 diabetes mellitus with hyperglycemia: Secondary | ICD-10-CM

## 2013-09-26 LAB — GLUCOSE, POCT (MANUAL RESULT ENTRY): POC GLUCOSE: 161 mg/dL — AB (ref 70–99)

## 2013-09-26 MED ORDER — INSULIN ASPART 100 UNIT/ML FLEXPEN: 25.0000 [IU] | PEN_INJECTOR | Freq: Three times a day (TID) | SUBCUTANEOUS | Status: DC

## 2013-09-26 NOTE — Progress Notes (Signed)
[name], female   DOB: 1950/10/23, 63 y.o.   MRN: [id number] Patient Demographics  Anita James, is a 63 y.o. female  [id number]  [id number]  DOB - Jul 18, 1951  Chief Complaint  Patient presents with  . office visit        Subjective:   Anita James is a 63 y.o. female here today for a follow up visit. Patient was seen in the ER recently for UTI, here for followup of ER visit. She also still continue to have depression, she claims Zoloft put her to sleep all the time. She thinks she may be getting better but the urinary tract infection and sent her back. Her urine culture grew Escherichia coli sensitive to ciprofloxacin. She was treated with Cipro for 5 days but she still have symptoms, now including her back. She has no fever. No headache. She denies chest pain. [name], [name], [name], [name], [name]  ALLERGIES: Allergies  Allergen Reactions  . Sulfa Antibiotics Shortness Of Breath  . Amoxicillin-Pot Clavulanate Nausea And Vomiting    Combination of medications tear lining of stomach    PAST MEDICAL HISTORY: Past Medical History  Diagnosis Date  . Diabetes mellitus type II, [name]   . Chronic kidney disease 03/2013    failure due to meds    MEDICATIONS AT HOME: Prior to Admission medications   Medication Sig Start Date End Date Taking? Authorizing Provider  acetaminophen-codeine (TYLENOL #3) 300-30 MG per tablet Take 2 tablets by mouth every 4 (four) hours as needed for moderate pain. 07/27/13  Yes Robbie Lis, MD  butalbital-acetaminophen-caffeine (FIORICET) [phone] MG per tablet Take 1-2 tablets by mouth every 6 (six) hours as needed for headache or migraine. 07/27/13 07/27/14 Yes Robbie Lis, MD  Choline Fenofibrate 135 MG  capsule Take 1 capsule (135 mg total) by mouth daily. 07/27/13  Yes Robbie Lis, MD  ciprofloxacin (CIPRO) 500 MG tablet Take 1 tablet (500 mg total) by mouth 2 (two) times daily. 09/19/13  Yes Mathis Bud, CNM  diazepam (VALIUM) 5 MG tablet Take 1 tablet (5 mg total) by mouth every 12 (twelve) hours as needed for anxiety. 07/27/13  Yes Robbie Lis, MD  fluticasone [facility]) 50 MCG/ACT nasal spray Place 2 sprays into both nostrils daily. 07/27/13  Yes Robbie Lis, MD  gabapentin (NEURONTIN) 300 MG capsule Take 1 capsule (300 mg total) by mouth 4 (four) times daily. 07/27/13  Yes Robbie Lis, MD  insulin aspart (NOVOLOG) 100 UNIT/ML FlexPen Inject 25 Units into the skin 3 (three) times daily with meals. 09/26/13  Yes Angelica Chessman, MD  insulin glargine (LANTUS) 100 UNIT/ML injection Inject 0.6 mLs (60 Units total) into the skin at bedtime. 01/18/13  Yes Acquanetta Chain, DO  isosorbide mononitrate (IMDUR) 30 MG 24 hr tablet Take 1 tablet (30 mg total) by mouth daily. 07/27/13  Yes Robbie Lis, MD  levothyroxine (SYNTHROID, LEVOTHROID) 100 MCG tablet Take 1 tablet (100 mcg total) by mouth daily. 07/27/13  Yes Robbie Lis, MD  lisinopril (PRINIVIL,ZESTRIL) 40 MG tablet Take 1 tablet (40 mg total) by mouth daily. 07/27/13  Yes Robbie Lis, MD  loratadine (CLARITIN) 10 MG tablet Take  1 tablet (10 mg total) by mouth daily. 06/22/13  Yes Clanford Marisa Hua, MD  methocarbamol (ROBAXIN) 500 MG tablet Take 1 tablet (500 mg total) by mouth 4 (four) times daily. 03/09/13  Yes Robbie Lis, MD  metoprolol succinate (TOPROL-XL) 50 MG 24 hr tablet Take 2 tablets (100 mg total) by mouth daily. 04/27/13  Yes Ripudeep Krystal Eaton, MD  Multiple Vitamins-Minerals (EYE VITAMINS) CAPS Take 1 capsule by mouth daily. 08/11/12  Yes Thurnell Lose, MD  nitroGLYCERIN (NITROSTAT) 0.4 MG SL tablet Place 0.4 mg under the tongue every 5 (five) minutes as needed for chest pain. 10/06/12  Yes Thurnell Lose, MD   oxyCODONE-acetaminophen (PERCOCET/ROXICET) 5-325 MG per tablet Take 1 tablet by mouth every 8 (eight) hours as needed for pain. 04/15/13  Yes Velvet Bathe, MD  pantoprazole (PROTONIX) 40 MG tablet Take 1 tablet (40 mg total) by mouth daily. 07/27/13  Yes Robbie Lis, MD  sertraline (ZOLOFT) 50 MG tablet Take 1 tablet (50 mg total) by mouth daily. 09/12/13  Yes Angelica Chessman, MD  simvastatin (ZOCOR) 20 MG tablet Take 1 tablet (20 mg total) by mouth at bedtime. 07/27/13  Yes Robbie Lis, MD  traMADol (ULTRAM) 50 MG tablet Take 2 tablets (100 mg total) by mouth every 8 (eight) hours as needed. 07/27/13  Yes Robbie Lis, MD     Objective:   Filed Vitals:   09/26/13 1141  BP: 183/103  Pulse: 83  Temp: 98.7 F (37.1 C)  TempSrc: Oral  Resp: 14  Height: 5\' 7"  (1.702 m)  Weight: 256 lb 6.4 oz (116.302 kg)  SpO2: 95%    Exam General appearance : Awake, alert, not in any distress. Speech Clear. Depressed, obese HEENT: Atraumatic and Normocephalic, pupils equally reactive to light and accomodation Neck: supple, no JVD. No cervical lymphadenopathy.  Chest:Good air entry bilaterally, no added sounds  CVS: S1 S2 regular, no murmurs.  Abdomen: Bowel sounds present, Non tender and not distended with no gaurding, rigidity or rebound. Extremities: B/L Lower Ext shows no edema, both legs are warm to touch Neurology: Awake alert, and oriented X 3, CN II-XII intact, Non focal Skin:No Rash Wounds:N/A   Data Review   CBC No results found for this basename: WBC, HGB, HCT, PLT, MCV, MCH, MCHC, RDW, NEUTRABS, LYMPHSABS, MONOABS, EOSABS, BASOSABS, BANDABS, BANDSABD,  in the last 168 hours  Chemistries   No results found for this basename: NA, K, CL, CO2, GLUCOSE, BUN, CREATININE, GFRCGP, CALCIUM, MG, AST, ALT, ALKPHOS, BILITOT,  in the last 168 hours ------------------------------------------------------------------------------------------------------------------ No results found for this  basename: HGBA1C,  in the last 72 hours ------------------------------------------------------------------------------------------------------------------ No results found for this basename: CHOL, HDL, LDLCALC, TRIG, CHOLHDL, LDLDIRECT,  in the last 72 hours ------------------------------------------------------------------------------------------------------------------ No results found for this basename: TSH, T4TOTAL, FREET3, T3FREE, THYROIDAB,  in the last 72 hours ------------------------------------------------------------------------------------------------------------------ No results found for this basename: VITAMINB12, FOLATE, FERRITIN, TIBC, IRON, RETICCTPCT,  in the last 72 hours  Coagulation profile  No results found for this basename: INR, PROTIME,  in the last 168 hours    Assessment & Plan   1. Chronic pain syndrome Continue current pain medication Patient counseled  2. Hypertension Continue present regimen as prescribed The patient counseled extensively about compliance with medications Patient counseled about nutrition and exercise  3. Diabetes mellitus type II, uncontrolled Patient was encouraged to continue medications as prescribed and insulin regimen - Glucose (CBG)  4. UTI (urinary tract infection) Patient has been treated with  ciprofloxacin We will repeat:- CULTURE, URINE COMPREHENSIVE  Since patient is still having significant depression as well as the comorbidities including newly diagnosed urinary tract infection, I will offer her 2 more weeks off duty to recuperate and also to allow her to see a psychiatrist. She has been given a referral to psychiatrist and also a form for a walk-in at the mental health department today.  Follow up in 3 months or when necessary   The patient was given clear instructions to go to ER or return to medical center if symptoms don't improve, worsen or new problems develop. The patient verbalized understanding. The patient  was told to call to get lab results if they haven't heard anything in the next week.    Angelica Chessman, MD, [place], [place], [place] and [place], [place]   09/26/2013, 5:38 PM

## 2013-09-26 NOTE — Progress Notes (Signed)
Pt is here for a f/u for kidney panel. Recently had a UTI and taking antibiotics. Requests a check up for the UTI. Zoloft messes with sleeping x2 weeks; having lower back pain. Complains of leg pain.

## 2013-09-26 NOTE — Patient Instructions (Signed)
Urinary Tract Infection  Urinary tract infections (UTIs) can develop anywhere along your urinary tract. Your urinary tract is your body's drainage system for removing wastes and extra water. Your urinary tract includes two kidneys, two ureters, a bladder, and a urethra. Your kidneys are a pair of bean-shaped organs. Each kidney is about the size of your fist. They are located below your ribs, one on each side of your spine.  CAUSES  Infections are caused by microbes, which are microscopic organisms, including fungi, viruses, and bacteria. These organisms are so small that they can only be seen through a microscope. Bacteria are the microbes that most commonly cause UTIs.  SYMPTOMS   Symptoms of UTIs may vary by age and gender of the patient and by the location of the infection. Symptoms in young women typically include a frequent and intense urge to urinate and a painful, burning feeling in the bladder or urethra during urination. Older women and men are more likely to be tired, shaky, and weak and have muscle aches and abdominal pain. A fever may mean the infection is in your kidneys. Other symptoms of a kidney infection include pain in your back or sides below the ribs, nausea, and vomiting.  DIAGNOSIS  To diagnose a UTI, your caregiver will ask you about your symptoms. Your caregiver also will ask to provide a urine sample. The urine sample will be tested for bacteria and white blood cells. White blood cells are made by your body to help fight infection.  TREATMENT   Typically, UTIs can be treated with medication. Because most UTIs are caused by a bacterial infection, they usually can be treated with the use of antibiotics. The choice of antibiotic and length of treatment depend on your symptoms and the type of bacteria causing your infection.  HOME CARE INSTRUCTIONS   If you were prescribed antibiotics, take them exactly as your caregiver instructs you. Finish the medication even if you feel better after you  have only taken some of the medication.   Drink enough water and fluids to keep your urine clear or pale yellow.   Avoid caffeine, tea, and carbonated beverages. They tend to irritate your bladder.   Empty your bladder often. Avoid holding urine for long periods of time.   Empty your bladder before and after sexual intercourse.   After a bowel movement, women should cleanse from front to back. Use each tissue only once.  SEEK MEDICAL CARE IF:    You have back pain.   You develop a fever.   Your symptoms do not begin to resolve within 3 days.  SEEK IMMEDIATE MEDICAL CARE IF:    You have severe back pain or lower abdominal pain.   You develop chills.   You have nausea or vomiting.   You have continued burning or discomfort with urination.  MAKE SURE YOU:    Understand these instructions.   Will watch your condition.   Will get help right away if you are not doing well or get worse.  Document Released: 06/17/2005 Document Revised: 03/08/2012 Document Reviewed: 10/16/2011  [employer] Patient Information 2014 [employer].

## 2013-09-27 LAB — CULTURE, URINE COMPREHENSIVE
Colony Count: NO GROWTH
Organism ID, Bacteria: NO GROWTH

## 2013-09-28 ENCOUNTER — Other Ambulatory Visit: Payer: Self-pay | Admitting: Family Medicine

## 2013-10-16 ENCOUNTER — Telehealth: Payer: Self-pay | Admitting: Emergency Medicine

## 2013-10-16 ENCOUNTER — Telehealth: Payer: Self-pay | Admitting: Internal Medicine

## 2013-10-16 NOTE — Telephone Encounter (Signed)
PT case worker called to inquire why pt has not returned to work and if the [employer] paperwork has been filled out; Case Worker's name is Tamela Oddi and can be reached at [phone] ext. [place]

## 2013-10-23 ENCOUNTER — Telehealth: Payer: Self-pay | Admitting: Emergency Medicine

## 2013-10-23 ENCOUNTER — Telehealth: Payer: Self-pay | Admitting: Internal Medicine

## 2013-10-23 ENCOUNTER — Other Ambulatory Visit: Payer: Self-pay | Admitting: Emergency Medicine

## 2013-10-23 MED ORDER — AMLODIPINE BESYLATE 10 MG PO TABS: 10.0000 mg | ORAL_TABLET | Freq: Every day | ORAL | Status: DC

## 2013-10-23 MED ORDER — METOPROLOL SUCCINATE ER 50 MG PO TB24: 100.0000 mg | ORAL_TABLET | Freq: Every day | ORAL | Status: DC

## 2013-10-23 NOTE — Telephone Encounter (Signed)
Pt is calling about the status of her BP medication; pt was informed that it is being taken care of today;

## 2013-10-23 NOTE — Telephone Encounter (Signed)
Left voice mail for pt to call

## 2013-10-24 ENCOUNTER — Other Ambulatory Visit: Payer: Self-pay | Admitting: Emergency Medicine

## 2013-10-24 NOTE — Telephone Encounter (Signed)
Spoke with pt and [facility] pharmacy due to script clarification. Pt received medication

## 2013-10-24 NOTE — Telephone Encounter (Signed)
Pt says that pt received 2 different scripts for bp meds and health dept needs clarification.

## 2013-12-05 ENCOUNTER — Encounter: Payer: Self-pay | Admitting: Internal Medicine

## 2013-12-05 ENCOUNTER — Ambulatory Visit: Payer: No Typology Code available for payment source | Attending: Internal Medicine | Admitting: Internal Medicine

## 2013-12-05 VITALS — BP 162/78 | HR 81 | Temp 98.5°F | Resp 16 | Ht 66.0 in | Wt 267.0 lb

## 2013-12-05 DIAGNOSIS — IMO0001 Reserved for inherently not codable concepts without codable children: Secondary | ICD-10-CM | POA: Insufficient documentation

## 2013-12-05 DIAGNOSIS — Z79899 Other long term (current) drug therapy: Secondary | ICD-10-CM | POA: Insufficient documentation

## 2013-12-05 DIAGNOSIS — E785 Hyperlipidemia, unspecified: Secondary | ICD-10-CM | POA: Insufficient documentation

## 2013-12-05 DIAGNOSIS — E1310 Other specified diabetes mellitus with ketoacidosis without coma: Secondary | ICD-10-CM

## 2013-12-05 DIAGNOSIS — Z794 Long term (current) use of insulin: Secondary | ICD-10-CM | POA: Insufficient documentation

## 2013-12-05 DIAGNOSIS — E039 Hypothyroidism, unspecified: Secondary | ICD-10-CM | POA: Insufficient documentation

## 2013-12-05 DIAGNOSIS — E1165 Type 2 diabetes mellitus with hyperglycemia: Principal | ICD-10-CM

## 2013-12-05 DIAGNOSIS — I129 Hypertensive chronic kidney disease with stage 1 through stage 4 chronic kidney disease, or unspecified chronic kidney disease: Secondary | ICD-10-CM | POA: Insufficient documentation

## 2013-12-05 DIAGNOSIS — M549 Dorsalgia, unspecified: Secondary | ICD-10-CM | POA: Insufficient documentation

## 2013-12-05 DIAGNOSIS — E1110 Type 2 diabetes mellitus with ketoacidosis without coma: Secondary | ICD-10-CM

## 2013-12-05 DIAGNOSIS — I10 Essential (primary) hypertension: Secondary | ICD-10-CM

## 2013-12-05 DIAGNOSIS — N189 Chronic kidney disease, unspecified: Secondary | ICD-10-CM | POA: Insufficient documentation

## 2013-12-05 DIAGNOSIS — G894 Chronic pain syndrome: Secondary | ICD-10-CM

## 2013-12-05 LAB — GLUCOSE, POCT (MANUAL RESULT ENTRY): POC Glucose: 212 mg/dl — AB (ref 70–99)

## 2013-12-05 LAB — POCT GLYCOSYLATED HEMOGLOBIN (HGB A1C): Hemoglobin A1C: 5.6

## 2013-12-05 MED ORDER — TRAMADOL HCL 50 MG PO TABS: 100.0000 mg | ORAL_TABLET | Freq: Three times a day (TID) | ORAL | Status: DC | PRN

## 2013-12-05 MED ORDER — DAPAGLIFLOZIN PROPANEDIOL 10 MG PO TABS: 10.0000 mg | ORAL_TABLET | Freq: Every day | ORAL | Status: DC

## 2013-12-05 MED ORDER — LISINOPRIL 40 MG PO TABS: 40.0000 mg | ORAL_TABLET | Freq: Every day | ORAL | Status: DC

## 2013-12-05 NOTE — Progress Notes (Signed)
Pt is here following up on her diabetes and HTN. Pt want to address her weight gain.

## 2013-12-05 NOTE — Progress Notes (Signed)
[name], female   DOB: 19-Sep-1951, 63 y.o.   MRN: [id number]   Anita James, is a 63 y.o. female  Anita James  NAT:[id number]  DOB - March 21, 1951  Chief Complaint  [name]        Subjective:   Anita James is a 63 y.o. female here today for a follow up visit. Patient is here today for routine diabetes followup. She also complained of back pain and excessive weight gain in the last 2 months of about 10 pounds. She claims to walk frequently especially in the neighborhood in an attempt to lose weight and also keep fit. She's not had a result instead she continue to gain weight. This is very frustrating her. She claims to be compliant with her medications but her blood sugar is in the 200s usually, her blood pressure however is controlled at home. Her blood pressure is usually 427 to 062 systolic, 37-62 diastolic. [name], [name], [name], [name], [name]  Problem  DM (Diabetes Mellitus) Type 2, Uncontrolled, With Ketoacidosis  Unspecified Essential Hypertension    ALLERGIES: Allergies  Allergen Reactions  . Sulfa Antibiotics Shortness Of Breath  . Amoxicillin-Pot Clavulanate Nausea And Vomiting    Combination of medications tear lining of stomach    PAST MEDICAL HISTORY: Past Medical History  Diagnosis Date  . Diabetes mellitus type II, [name]   . Chronic kidney disease 03/2013    failure due to meds    MEDICATIONS AT HOME: Prior to Admission medications   Medication Sig Start Date End Date Taking? Authorizing Provider  butalbital-acetaminophen-caffeine (FIORICET) 50-325-40 MG per tablet Take 1-2 tablets by mouth every 6 (six) hours as needed for headache or migraine. 07/27/13 07/27/14 Yes Robbie Lis, MD    diazepam (VALIUM) 5 MG tablet Take 1 tablet (5 mg total) by mouth every 12 (twelve) hours as needed for anxiety. 07/27/13  Yes Robbie Lis, MD  fluticasone [facility]) 50 MCG/ACT nasal spray Place 2 sprays into both nostrils daily. 07/27/13  Yes Robbie Lis, MD  gabapentin (NEURONTIN) 300 MG capsule Take 1 capsule (300 mg total) by mouth 4 (four) times daily. 07/27/13  Yes Robbie Lis, MD  insulin aspart (NOVOLOG) 100 UNIT/ML FlexPen Inject 25 Units into the skin 3 (three) times daily with meals. 09/26/13  Yes Angelica Chessman, MD  insulin glargine (LANTUS) 100 UNIT/ML injection Inject 0.6 mLs (60 Units total) into the skin at bedtime. 01/18/13  Yes Acquanetta Chain, DO  levothyroxine (SYNTHROID, LEVOTHROID) 100 MCG tablet Take 1 tablet (100 mcg total) by mouth daily. 07/27/13  Yes Robbie Lis, MD  lisinopril (PRINIVIL,ZESTRIL) 40 MG tablet Take 1 tablet (40 mg total) by mouth daily. 12/05/13  Yes Angelica Chessman, MD  loratadine (CLARITIN) 10 MG tablet Take 1 tablet (10 mg total) by mouth daily. 06/22/13  Yes Clanford Marisa Hua, MD  methocarbamol (ROBAXIN) 500 MG tablet Take 1 tablet (500 mg total) by mouth 4 (four) times daily. 03/09/13  Yes Robbie Lis, MD  metoprolol succinate (TOPROL-XL) 50 MG 24 hr tablet Take 2 tablets (100 mg total) by mouth daily. 10/23/13  Yes Angelica Chessman, MD  Multiple Vitamins-Minerals (EYE VITAMINS) CAPS Take 1  capsule by mouth daily. 08/11/12  Yes Thurnell Lose, MD  nitroGLYCERIN (NITROSTAT) 0.4 MG SL tablet Place 0.4 mg under the tongue every 5 (five) minutes as needed for chest pain. 10/06/12  Yes Thurnell Lose, MD  pantoprazole (PROTONIX) 40 MG tablet Take 1 tablet (40 mg total) by mouth daily. 07/27/13  Yes Robbie Lis, MD  sertraline (ZOLOFT) 50 MG tablet Take 1 tablet (50 mg total) by mouth daily. 09/12/13  Yes Angelica Chessman, MD  simvastatin (ZOCOR) 20 MG tablet Take 1 tablet (20 mg total) by mouth at bedtime. 07/27/13  Yes Robbie Lis, MD   traMADol (ULTRAM) 50 MG tablet Take 2 tablets (100 mg total) by mouth every 8 (eight) hours as needed. 12/05/13  Yes Angelica Chessman, MD  acetaminophen-codeine (TYLENOL #3) 300-30 MG per tablet Take 2 tablets by mouth every 4 (four) hours as needed for moderate pain. 07/27/13   Robbie Lis, MD  Choline Fenofibrate 135 MG capsule Take 1 capsule (135 mg total) by mouth daily. 07/27/13   Robbie Lis, MD  ciprofloxacin (CIPRO) 500 MG tablet Take 1 tablet (500 mg total) by mouth 2 (two) times daily. 09/19/13   Heather Erby Pian, CNM  Dapagliflozin Propanediol (FARXIGA) 10 MG TABS Take 10 mg by mouth daily. 12/05/13   Angelica Chessman, MD  isosorbide mononitrate (IMDUR) 30 MG 24 hr tablet Take 1 tablet (30 mg total) by mouth daily. 07/27/13   Robbie Lis, MD  oxyCODONE-acetaminophen (PERCOCET/ROXICET) 5-325 MG per tablet Take 1 tablet by mouth every 8 (eight) hours as needed for pain. 04/15/13   Velvet Bathe, MD     Objective:   Filed Vitals:   12/05/13 1021  BP: 162/78  Pulse: 81  Temp: 98.5 F (36.9 C)  TempSrc: Oral  Resp: 16  Height: 5\' 6"  (1.676 m)  Weight: 267 lb (121.11 kg)  SpO2: 97%    Exam General appearance : Awake, alert, not in any distress. Speech Clear. Not toxic looking. Morbidly obese HEENT: Atraumatic and Normocephalic, pupils equally reactive to light and accomodation Neck: supple, no JVD. No cervical lymphadenopathy.  Chest:Good air entry bilaterally, no added sounds  CVS: S1 S2 regular, no murmurs.  Abdomen: Bowel sounds present, Non tender and not distended with no gaurding, rigidity or rebound. Extremities: B/L Lower Ext shows no edema, both legs are warm to touch Neurology: Awake alert, and oriented X 3, CN II-XII intact, Non focal Skin:No Rash Wounds:N/A  Data Review Lab Results  Component Value Date   HGBA1C 5.6 12/05/2013   HGBA1C 6.3 07/27/2013   HGBA1C 7.0* 10/05/2012     Assessment & Plan   1. DM (diabetes mellitus) type 2,  uncontrolled  - Glucose (CBG) - HgB A1c is 5.6 today down from 6.3% Due to increasing weight gain and blood sugar being in the 200s at home, I will add Dapagliflozin to her current regimen to help correct hyperglycemia as well as weight loss  - Dapagliflozin Propanediol (FARXIGA) 10 MG TABS; Take 10 mg by mouth daily.  Dispense: 30 tablet; Refill: 3  2. Unspecified essential hypertension. Repeat blood pressure today is 130/80 mmHg Refill - lisinopril (PRINIVIL,ZESTRIL) 40 MG tablet; Take 1 tablet (40 mg total) by mouth daily.  Dispense: 90 tablet; Refill: 3  3. Chronic pain syndrome Refill - traMADol (ULTRAM) 50 MG tablet; Take 2 tablets (100 mg total) by mouth every 8 (eight) hours as needed.  Dispense: 90 tablet; Refill: 3  Patient was counseled extensively on signs  and symptoms of hypoglycemia and actions to take if symptoms develop Patient was extensively counseled on nutrition and exercise  Return in about 3 months (around 03/07/2014), or if symptoms worsen or fail to improve, for Hemoglobin A1C and Follow up, DM.  The patient was given clear instructions to go to ER or return to medical center if symptoms don't improve, worsen or new problems develop. The patient verbalized understanding. The patient was told to call to get lab results if they haven't heard anything in the next week.   This note has been created with Surveyor, quantity. Any transcriptional errors are unintentional.    Angelica Chessman, MD, [place], [place], [place] and [facility] [place], [place]   12/05/2013, 11:15 AM

## 2013-12-05 NOTE — Patient Instructions (Signed)

## 2013-12-11 ENCOUNTER — Ambulatory Visit: Payer: Self-pay

## 2013-12-23 ENCOUNTER — Encounter (HOSPITAL_COMMUNITY): Payer: Self-pay | Admitting: Emergency Medicine

## 2013-12-23 ENCOUNTER — Emergency Department (HOSPITAL_COMMUNITY)
Admission: EM | Admit: 2013-12-23 | Discharge: 2013-12-23 | Disposition: A | Discharge status: Home / Self Care | Payer: No Typology Code available for payment source | Attending: Emergency Medicine | Admitting: Emergency Medicine

## 2013-12-23 ENCOUNTER — Emergency Department (INDEPENDENT_AMBULATORY_CARE_PROVIDER_SITE_OTHER): Payer: No Typology Code available for payment source

## 2013-12-23 ENCOUNTER — Emergency Department (HOSPITAL_COMMUNITY)
Admission: EM | Admit: 2013-12-23 | Discharge: 2013-12-23 | Disposition: A | Discharge status: Nursing Home (Includes ICF) | Payer: No Typology Code available for payment source | Source: Home / Self Care | Attending: Family Medicine | Admitting: Family Medicine

## 2013-12-23 ENCOUNTER — Emergency Department (HOSPITAL_COMMUNITY): Payer: No Typology Code available for payment source

## 2013-12-23 DIAGNOSIS — N189 Chronic kidney disease, unspecified: Secondary | ICD-10-CM | POA: Insufficient documentation

## 2013-12-23 DIAGNOSIS — E039 Hypothyroidism, unspecified: Secondary | ICD-10-CM | POA: Insufficient documentation

## 2013-12-23 DIAGNOSIS — Z880 Allergy status to penicillin: Secondary | ICD-10-CM | POA: Insufficient documentation

## 2013-12-23 DIAGNOSIS — R51 Headache: Secondary | ICD-10-CM | POA: Insufficient documentation

## 2013-12-23 DIAGNOSIS — Z79899 Other long term (current) drug therapy: Secondary | ICD-10-CM | POA: Insufficient documentation

## 2013-12-23 DIAGNOSIS — Z881 Allergy status to other antibiotic agents status: Secondary | ICD-10-CM | POA: Insufficient documentation

## 2013-12-23 DIAGNOSIS — E785 Hyperlipidemia, unspecified: Secondary | ICD-10-CM | POA: Insufficient documentation

## 2013-12-23 DIAGNOSIS — K449 Diaphragmatic hernia without obstruction or gangrene: Secondary | ICD-10-CM | POA: Insufficient documentation

## 2013-12-23 DIAGNOSIS — R079 Chest pain, unspecified: Secondary | ICD-10-CM | POA: Insufficient documentation

## 2013-12-23 DIAGNOSIS — Z882 Allergy status to sulfonamides status: Secondary | ICD-10-CM | POA: Insufficient documentation

## 2013-12-23 DIAGNOSIS — G589 Mononeuropathy, unspecified: Secondary | ICD-10-CM | POA: Insufficient documentation

## 2013-12-23 DIAGNOSIS — R109 Unspecified abdominal pain: Secondary | ICD-10-CM

## 2013-12-23 DIAGNOSIS — IMO0002 Reserved for concepts with insufficient information to code with codable children: Secondary | ICD-10-CM | POA: Insufficient documentation

## 2013-12-23 DIAGNOSIS — F411 Generalized anxiety disorder: Secondary | ICD-10-CM | POA: Insufficient documentation

## 2013-12-23 DIAGNOSIS — E669 Obesity, unspecified: Secondary | ICD-10-CM | POA: Insufficient documentation

## 2013-12-23 DIAGNOSIS — R1011 Right upper quadrant pain: Secondary | ICD-10-CM | POA: Insufficient documentation

## 2013-12-23 DIAGNOSIS — M549 Dorsalgia, unspecified: Secondary | ICD-10-CM | POA: Insufficient documentation

## 2013-12-23 DIAGNOSIS — E1165 Type 2 diabetes mellitus with hyperglycemia: Secondary | ICD-10-CM

## 2013-12-23 DIAGNOSIS — Z8701 Personal history of pneumonia (recurrent): Secondary | ICD-10-CM | POA: Insufficient documentation

## 2013-12-23 DIAGNOSIS — IMO0001 Reserved for inherently not codable concepts without codable children: Secondary | ICD-10-CM | POA: Insufficient documentation

## 2013-12-23 DIAGNOSIS — I129 Hypertensive chronic kidney disease with stage 1 through stage 4 chronic kidney disease, or unspecified chronic kidney disease: Secondary | ICD-10-CM | POA: Insufficient documentation

## 2013-12-23 DIAGNOSIS — G8929 Other chronic pain: Secondary | ICD-10-CM | POA: Insufficient documentation

## 2013-12-23 DIAGNOSIS — I10 Essential (primary) hypertension: Secondary | ICD-10-CM | POA: Insufficient documentation

## 2013-12-23 DIAGNOSIS — Z8719 Personal history of other diseases of the digestive system: Secondary | ICD-10-CM | POA: Insufficient documentation

## 2013-12-23 DIAGNOSIS — Z794 Long term (current) use of insulin: Secondary | ICD-10-CM | POA: Insufficient documentation

## 2013-12-23 DIAGNOSIS — K5732 Diverticulitis of large intestine without perforation or abscess without bleeding: Secondary | ICD-10-CM | POA: Insufficient documentation

## 2013-12-23 HISTORY — DX: Headache: R51

## 2013-12-23 HISTORY — DX: Chest pain, unspecified: R07.9

## 2013-12-23 HISTORY — DX: Other chronic pain: G89.29

## 2013-12-23 HISTORY — DX: Headache, unspecified: R51.9

## 2013-12-23 HISTORY — DX: Other chronic pain: M54.9

## 2013-12-23 LAB — POCT URINALYSIS DIP (DEVICE)
BILIRUBIN URINE: NEGATIVE
GLUCOSE, UA: 500 mg/dL — AB
HGB URINE DIPSTICK: NEGATIVE
Ketones, ur: NEGATIVE mg/dL
Leukocytes, UA: NEGATIVE
NITRITE: NEGATIVE
PH: 5.5 (ref 5.0–8.0)
Protein, ur: NEGATIVE mg/dL
Specific Gravity, Urine: 1.01 (ref 1.005–1.030)
Urobilinogen, UA: 0.2 mg/dL (ref 0.0–1.0)

## 2013-12-23 LAB — CBC WITH DIFFERENTIAL/PLATELET
Basophils Absolute: 0 10*3/uL (ref 0.0–0.1)
Basophils Relative: 0 % (ref 0–1)
Eosinophils Absolute: 0.1 10*3/uL (ref 0.0–0.7)
Eosinophils Relative: 1 % (ref 0–5)
HEMATOCRIT: 40.4 % (ref 36.0–46.0)
HEMOGLOBIN: 14.2 g/dL (ref 12.0–15.0)
LYMPHS ABS: 1.8 10*3/uL (ref 0.7–4.0)
Lymphocytes Relative: 33 % (ref 12–46)
MCH: 30.6 pg (ref 26.0–34.0)
MCHC: 35.1 g/dL (ref 30.0–36.0)
MCV: 87.1 fL (ref 78.0–100.0)
MONO ABS: 0.7 10*3/uL (ref 0.1–1.0)
MONOS PCT: 13 % — AB (ref 3–12)
NEUTROS PCT: 53 % (ref 43–77)
Neutro Abs: 2.8 10*3/uL (ref 1.7–7.7)
Platelets: 189 10*3/uL (ref 150–400)
RBC: 4.64 MIL/uL (ref 3.87–5.11)
RDW: 13.4 % (ref 11.5–15.5)
WBC: 5.3 10*3/uL (ref 4.0–10.5)

## 2013-12-23 LAB — COMPREHENSIVE METABOLIC PANEL
ALT: 15 U/L (ref 0–35)
AST: 18 U/L (ref 0–37)
Albumin: 3.5 g/dL (ref 3.5–5.2)
Alkaline Phosphatase: 56 U/L (ref 39–117)
BILIRUBIN TOTAL: 0.3 mg/dL (ref 0.3–1.2)
BUN: 10 mg/dL (ref 6–23)
CHLORIDE: 104 meq/L (ref 96–112)
CO2: 27 mEq/L (ref 19–32)
CREATININE: 0.7 mg/dL (ref 0.50–1.10)
Calcium: 9.3 mg/dL (ref 8.4–10.5)
GLUCOSE: 84 mg/dL (ref 70–99)
Potassium: 4.1 mEq/L (ref 3.7–5.3)
Sodium: 144 mEq/L (ref 137–147)
Total Protein: 7.1 g/dL (ref 6.0–8.3)

## 2013-12-23 LAB — LIPASE, BLOOD: LIPASE: 27 U/L (ref 11–59)

## 2013-12-23 MED ORDER — MORPHINE SULFATE 4 MG/ML IJ SOLN
6.0000 mg | Freq: Once | INTRAMUSCULAR | Status: AC
  Administered 2013-12-23: 6 mg via INTRAVENOUS
  Filled 2013-12-23: qty 2

## 2013-12-23 NOTE — ED Provider Notes (Signed)
Medical screening examination/treatment/procedure(s) were performed by resident physician or non-physician practitioner and as supervising physician I was immediately available for consultation/collaboration.   Pauline Good MD.   Billy Fischer, MD 12/23/13 [phone]

## 2013-12-23 NOTE — ED Notes (Addendum)
Noticed tender "knot" below right anterior ribcage in RUQ region 2 days ago.  Pain constant, worse when laying on either side.  States feels SOB due to discomfort when breathing.  Denies any injuries.  Denies constipation.  Last BM yesterday - states normal.  Denies n/v/d, fevers.

## 2013-12-23 NOTE — ED Notes (Addendum)
Pt sent from urgent care for further eval of Right upper abdominal pain x 2 days. Pt denies N/V, reports abdomen distention. Pt denies change in stools.

## 2013-12-23 NOTE — ED Notes (Signed)
Pt states she needs to take the lady she lives with to church so she is leaving.  PA Bowie notified.

## 2013-12-23 NOTE — ED Provider Notes (Signed)
CSN: [id number]     Arrival date & time 12/23/13  1449 History   First MD Initiated Contact with Patient 12/23/13 1522     Chief Complaint  Patient presents with  . Abdominal Pain     (Consider location/radiation/quality/duration/timing/severity/associated sxs/prior Treatment) HPI  63 year old female with history of insulin-dependent diabetes, hypertension, hypothyroidism, hyperlipidemia, diverticulitis, chronic back pain, and hiatal hernia presents complaining of right upper quadrant abdominal pain. Patient was sent here from urgent care. Patient reports pain to the right upper quadrant for the past 2 days. States pain worsened with taking deep breath. Describe pain as a burning sensation constant, nonradiating, worsening with taking deep breath and currently rated as a 10 out of 10. She denies fever, chills, lightheadedness, dizziness, chest pain, productive cough, hemoptysis, back pain, dysuria, hematuria, hematochezia or melena. She reports eating does not affect the pain. Change in position does not affect the pain. She denies prior history of PE, DVT, hormone use, recent surgery, prolonged bed rest, unilateral leg swelling or calf pain. She denies having shortness of breath or dyspnea on exertion.    Past Medical History  Diagnosis Date  . Diabetes mellitus type II, [name]   . Frequent headaches   . Chronic back pain   . Chronic kidney disease 03/2013    failure due to meds  . Chest pain    Past Surgical History  Procedure Laterality Date  . Cesarean section    . Knee surgery    . Thyroid surgery     Family History  Problem Relation Age of Onset  . Cancer Mother     cervical- mets   History  Substance Use Topics  . Smoking status: Never Smoker   . Smokeless tobacco: Never Used  . Alcohol Use: No   OB History   Grav Para Term  Preterm Abortions TAB SAB Ect Mult Living   4 3 2 1 1   1 2 6      Review of Systems  All other systems reviewed and are negative.      Allergies  Sulfa antibiotics and Amoxicillin-pot clavulanate  Home Medications   Current Outpatient Rx  Name  Route  Sig  Dispense  Refill  . acetaminophen-codeine (TYLENOL #3) 300-30 MG per tablet   Oral   Take 2 tablets by mouth every 4 (four) hours as needed for moderate pain.   60 tablet   0   . butalbital-acetaminophen-caffeine (FIORICET) 50-325-40 MG per tablet   Oral   Take 1-2 tablets by mouth every 6 (six) hours as needed for headache or migraine.   30 tablet   0   . Choline Fenofibrate 135 MG capsule   Oral   Take 1 capsule (135 mg total) by mouth daily.   30 capsule   5   . ciprofloxacin (CIPRO) 500 MG tablet   Oral   Take 1 tablet (500 mg total) by mouth 2 (two) times daily.   10 tablet   0   . Dapagliflozin Propanediol (FARXIGA) 10 MG TABS   Oral   Take 10 mg by mouth daily.   30 tablet   3   . diazepam (VALIUM) 5 MG tablet   Oral   Take 1 tablet (5 mg total) by mouth every 12 (twelve) hours as needed for anxiety.  60 tablet   1   . fluticasone (FLONASE) 50 MCG/ACT nasal spray   Each Nare   Place 2 sprays into both nostrils daily.   16 g   5   . gabapentin (NEURONTIN) 300 MG capsule   Oral   Take 1 capsule (300 mg total) by mouth 4 (four) times daily.   120 capsule   5   . insulin aspart (NOVOLOG) 100 UNIT/ML FlexPen   Subcutaneous   Inject 30 Units into the skin 4 (four) times daily.         . insulin glargine (LANTUS) 100 UNIT/ML injection   Subcutaneous   Inject 0.6 mLs (60 Units total) into the skin at bedtime.   10 mL   12   . isosorbide mononitrate (IMDUR) 30 MG 24 hr tablet   Oral   Take 1 tablet (30 mg total) by mouth daily.   30 tablet   6   . levothyroxine (SYNTHROID, LEVOTHROID) 100 MCG tablet   Oral   Take 1 tablet (100 mcg total) by mouth daily.   30 tablet   5   .  lisinopril (PRINIVIL,ZESTRIL) 40 MG tablet   Oral   Take 1 tablet (40 mg total) by mouth daily.   90 tablet   3   . loratadine (CLARITIN) 10 MG tablet   Oral   Take 1 tablet (10 mg total) by mouth daily.   30 tablet   3   . methocarbamol (ROBAXIN) 500 MG tablet   Oral   Take 1 tablet (500 mg total) by mouth 4 (four) times daily.   30 tablet   2   . metoprolol succinate (TOPROL-XL) 50 MG 24 hr tablet   Oral   Take 2 tablets (100 mg total) by mouth daily.   60 tablet   3   . Multiple Vitamins-Minerals (EYE VITAMINS) CAPS   Oral   Take 1 capsule by mouth daily.         . nitroGLYCERIN (NITROSTAT) 0.4 MG SL tablet   Sublingual   Place 0.4 mg under the tongue every 5 (five) minutes as needed for chest pain.         Marland Kitchen oxyCODONE-acetaminophen (PERCOCET/ROXICET) 5-325 MG per tablet   Oral   Take 1 tablet by mouth every 8 (eight) hours as needed for pain.   15 tablet   0   . pantoprazole (PROTONIX) 40 MG tablet   Oral   Take 1 tablet (40 mg total) by mouth daily.   30 tablet   5   . sertraline (ZOLOFT) 50 MG tablet   Oral   Take 1 tablet (50 mg total) by mouth daily.   30 tablet   3   . simvastatin (ZOCOR) 20 MG tablet   Oral   Take 1 tablet (20 mg total) by mouth at bedtime.   30 tablet   5   . traMADol (ULTRAM) 50 MG tablet   Oral   Take 2 tablets (100 mg total) by mouth every 8 (eight) hours as needed.   90 tablet   3    BP 123/75  Pulse 71  Temp(Src) 98 F (36.7 C) (Oral)  Resp 18  Ht 5\' 6"  (1.676 m)  Wt 260 lb (117.935 kg)  BMI 41.99 kg/m2  SpO2 95% Physical Exam  Nursing note and vitals reviewed. Constitutional: She is oriented to person, place, and time. She appears well-developed and well-nourished. No distress.  Moderately obese Awake, alert, nontoxic appearance  HENT:  Head: Atraumatic.  Eyes: Conjunctivae are normal. Right eye exhibits no discharge. Left eye exhibits no discharge.  Neck: Neck supple.  Cardiovascular: Normal rate  and regular rhythm.   Pulmonary/Chest: Effort normal. No respiratory distress. She has no wheezes. She has no rales. She exhibits no tenderness.  Abdominal: Soft. Bowel sounds are normal. There is tenderness (rreproducible right upper quadrant abdominal tenderness on palpation  without guarding or rebound tenderness.). There is no rebound.  Genitourinary:  No CVA tenderness  Musculoskeletal: She exhibits no tenderness.  ROM appears intact, no obvious focal weakness  Bilateral lower extremities without palpable cords, erythema, edema, negative Homans sign  Neurological: She is alert and oriented to person, place, and time.  Mental status and motor strength appears intact  Skin: No rash noted.  Psychiatric: She has a normal mood and affect.    ED Course  Procedures (including critical care time)  3:56 PM Patient with right upper quadrant abdominal pain with possible concern for gallbladder etiology. She has no significant risk factor for PE or DVT. Pain is reproducible on exam. She has had blood work and x-ray, and UA performed at urgent care today. Will obtain abdominal ultrasound for further evaluation. Patient is otherwise stable, afebrile with stable normal vital signs. She is in no acute respiratory distress.  5:46 PM Abdominal ultrasound reveals no evidence concerning for biliary disease. Some evidence suggests hepatic steatosis. However patient has normal liver function at this time. She continues to endorse abdominal pain and would need CT scan for further evaluation. However patient requests to be discharged so she can take her friend to church. I discussed her risk of worsening pain, having complication including death without further evaluation, patient acknowledge risk and agrees to sign AMA papers. She is able to make informed decisions. She is instructed to return if her pain worsens for further evaluation. Care discussed with my attending.  Labs Review Labs Reviewed  CBC WITH  DIFFERENTIAL - Abnormal; Notable for the following:    Monocytes Relative 13 (*)    All other components within normal limits  COMPREHENSIVE METABOLIC PANEL  LIPASE, BLOOD   Imaging Review Dg Chest 2 View  12/23/2013   CLINICAL DATA:  Difficulty breathing  EXAM: CHEST  2 VIEW  COMPARISON:  April 14, 2013  FINDINGS: Lungs are clear. Heart is upper normal in size with normal pulmonary vascularity. No adenopathy. No bone lesions. There is atherosclerotic change in the aortic arch region.  IMPRESSION: No edema or consolidation.   Electronically Signed   By: Lowella Grip M.D.   On: 12/23/2013 14:09     EKG Interpretation None      MDM   Final diagnoses:  Abdominal pain, right upper quadrant    BP 123/75  Pulse 71  Temp(Src) 98 F (36.7 C) (Oral)  Resp 18  Ht 5\' 6"  (1.676 m)  Wt 260 lb (117.935 kg)  BMI 41.99 kg/m2  SpO2 95%     Domenic Moras, PA-C 12/23/13 1749

## 2013-12-23 NOTE — ED Notes (Signed)
Pt to be transferred to ED via shuttle.  Report called to Luellen Pucker, ED First Nurse.

## 2013-12-23 NOTE — ED Notes (Signed)
PT reports "it feels like my skin is burning, but from the inside" points to RUQ of abdomen, tender upon palpation. Onset 2 days ago. Came from doc office for u/s

## 2013-12-23 NOTE — Discharge Instructions (Signed)
Discharge Against Medical Advice I am signing this paper to show that I am leaving this hospital or health care center of my own free will. It is done against all medical advice. In doing so, I am releasing this hospital or health care center and the attending physicians from any and all claims that I may want to make. I understand that further care has been recommended. My condition may worsen. This could cause me further bodily injury, illness, or even death. I do know that the medical staff has fully explained to me the risk that I am taking in leaving against medical advice. Document Released: 09/07/2005 Document Revised: 11/30/2011 Document Reviewed: 02/22/2007 [facility] Patient Information 2014 [place].

## 2013-12-23 NOTE — ED Provider Notes (Signed)
CSN: [id number]     Arrival date & time 12/23/13  1233 History   First MD Initiated Contact with Patient 12/23/13 1334     Chief Complaint  Patient presents with  . Abdominal Pain  . Shortness of Breath   (Consider location/radiation/quality/duration/timing/severity/associated sxs/prior Treatment) HPI Comments: States it is uncomfortable at her RUQ of her abdomen when she takes a deep breath. She does not feel dyspneic, just does not like to take a deep breath.  Only previous abdominal surgery has been C-section x 2.  Made an effort to be seen by her PCP when symptoms began but has not yet been given an appointment. PCP: Zimmerman  Patient is a 63 y.o. female presenting with abdominal pain and shortness of breath. The history is provided by the patient.  Abdominal Pain Pain location:  RUQ Pain quality: aching   Pain radiates to:  R flank Pain severity:  Moderate Onset quality:  Gradual Duration:  2 days Timing:  Constant Chronicity:  New Context: not alcohol use, not diet changes, not eating, not laxative use, not previous surgeries, not recent travel, not retching, not sick contacts, not suspicious food intake and not trauma   Relieved by:  Nothing Worsened by:  Nothing tried Ineffective treatments:  None tried Associated symptoms: no anorexia, no belching, no chest pain, no chills, no constipation, no cough, no diarrhea, no dysuria, no fatigue, no fever, no flatus, no hematemesis, no hematochezia, no hematuria, no melena, no nausea, no shortness of breath, no sore throat, no vaginal bleeding, no vaginal discharge and no vomiting   Risk factors: obesity   Risk factors: has not had multiple surgeries   Shortness of Breath Associated symptoms: abdominal pain   Associated symptoms: no chest pain, no cough, no fever, no sore throat and no vomiting     Past Medical History  Diagnosis Date  . Diabetes mellitus type II, uncontrolled   . Hypertension   . Hypothyroidism   . Hiatal hernia     . Hyperlipemia   . Diverticulitis   . Pneumonia   . Anxiety   . Neuropathy   . Frequent headaches   . Chronic back pain   . Chronic kidney disease 03/2013    failure due to meds  . Chest pain    Past Surgical History  Procedure Laterality Date  . Cesarean section    . Knee surgery    . Thyroid surgery     Family History  Problem Relation Age of Onset  . Cancer Mother     cervical- mets   History  Substance Use Topics  . Smoking status: Never Smoker   . Smokeless tobacco: Never Used  . Alcohol Use: No   OB History   Grav Para Term Preterm Abortions TAB SAB Ect Mult Living   4 3 2 1 1   1 2 6      Review of Systems  Constitutional: Negative for fever, chills and fatigue.  HENT: Negative.  Negative for sore throat.   Eyes: Negative.   Respiratory: Negative for cough and shortness of breath.   Cardiovascular: Negative for chest pain.  Gastrointestinal: Positive for abdominal pain. Negative for nausea, vomiting, diarrhea, constipation, melena, hematochezia, anorexia, flatus and hematemesis.  Endocrine: Negative for polydipsia, polyphagia and polyuria.  Genitourinary: Negative for dysuria, hematuria, vaginal bleeding and vaginal discharge.  Skin: Negative.   Neurological: Negative.     Allergies  Sulfa antibiotics and Amoxicillin-pot clavulanate  Home Medications   Current Outpatient Rx  Name  Route  Sig  Dispense  Refill  . Dapagliflozin Propanediol (FARXIGA) 10 MG TABS   Oral   Take 10 mg by mouth daily.   30 tablet   3   . fluticasone (FLONASE) 50 MCG/ACT nasal spray   Each Nare   Place 2 sprays into both nostrils daily.   16 g   5   . gabapentin (NEURONTIN) 300 MG capsule   Oral   Take 1 capsule (300 mg total) by mouth 4 (four) times daily.   120 capsule   5   . insulin aspart (NOVOLOG) 100 UNIT/ML FlexPen   Subcutaneous   Inject 30 Units into the skin 4 (four) times daily.         . insulin glargine (LANTUS) 100 UNIT/ML injection    Subcutaneous   Inject 0.6 mLs (60 Units total) into the skin at bedtime.   10 mL   12   . isosorbide mononitrate (IMDUR) 30 MG 24 hr tablet   Oral   Take 1 tablet (30 mg total) by mouth daily.   30 tablet   6   . levothyroxine (SYNTHROID, LEVOTHROID) 100 MCG tablet   Oral   Take 1 tablet (100 mcg total) by mouth daily.   30 tablet   5   . lisinopril (PRINIVIL,ZESTRIL) 40 MG tablet   Oral   Take 1 tablet (40 mg total) by mouth daily.   90 tablet   3   . loratadine (CLARITIN) 10 MG tablet   Oral   Take 1 tablet (10 mg total) by mouth daily.   30 tablet   3   . methocarbamol (ROBAXIN) 500 MG tablet   Oral   Take 1 tablet (500 mg total) by mouth 4 (four) times daily.   30 tablet   2   . metoprolol succinate (TOPROL-XL) 50 MG 24 hr tablet   Oral   Take 2 tablets (100 mg total) by mouth daily.   60 tablet   3   . Multiple Vitamins-Minerals (EYE VITAMINS) CAPS   Oral   Take 1 capsule by mouth daily.         . nitroGLYCERIN (NITROSTAT) 0.4 MG SL tablet   Sublingual   Place 0.4 mg under the tongue every 5 (five) minutes as needed for chest pain.         . pantoprazole (PROTONIX) 40 MG tablet   Oral   Take 1 tablet (40 mg total) by mouth daily.   30 tablet   5   . sertraline (ZOLOFT) 50 MG tablet   Oral   Take 1 tablet (50 mg total) by mouth daily.   30 tablet   3   . simvastatin (ZOCOR) 20 MG tablet   Oral   Take 1 tablet (20 mg total) by mouth at bedtime.   30 tablet   5   . traMADol (ULTRAM) 50 MG tablet   Oral   Take 2 tablets (100 mg total) by mouth every 8 (eight) hours as needed.   90 tablet   3   . acetaminophen-codeine (TYLENOL #3) 300-30 MG per tablet   Oral   Take 2 tablets by mouth every 4 (four) hours as needed for moderate pain.   60 tablet   0   . butalbital-acetaminophen-caffeine (FIORICET) 50-325-40 MG per tablet   Oral   Take 1-2 tablets by mouth every 6 (six) hours as needed for headache or migraine.   30 tablet   0   .  Choline  Fenofibrate 135 MG capsule   Oral   Take 1 capsule (135 mg total) by mouth daily.   30 capsule   5   . ciprofloxacin (CIPRO) 500 MG tablet   Oral   Take 1 tablet (500 mg total) by mouth 2 (two) times daily.   10 tablet   0   . diazepam (VALIUM) 5 MG tablet   Oral   Take 1 tablet (5 mg total) by mouth every 12 (twelve) hours as needed for anxiety.   60 tablet   1   . oxyCODONE-acetaminophen (PERCOCET/ROXICET) 5-325 MG per tablet   Oral   Take 1 tablet by mouth every 8 (eight) hours as needed for pain.   15 tablet   0    BP 123/72  Pulse 77  Temp(Src) 98.5 F (36.9 C) (Oral)  Resp 17  SpO2 93% Physical Exam  Nursing note and vitals reviewed. Constitutional: She is oriented to person, place, and time. She appears well-developed and well-nourished. No distress.  +obese  HENT:  Head: Normocephalic and atraumatic.  Eyes: Conjunctivae are normal. No scleral icterus.  Neck: Normal range of motion. Neck supple.  Cardiovascular: Normal rate, regular rhythm and normal heart sounds.   Pulmonary/Chest: Effort normal and breath sounds normal.  Abdominal: Soft. Bowel sounds are normal. She exhibits no distension. There is tenderness in the right upper quadrant. There is positive Murphy's sign. There is no rigidity, no rebound, no guarding and no CVA tenderness.    Outlined area is area of tenderness. Exam limited by patient's body habitus.   Musculoskeletal: Normal range of motion.  Neurological: She is alert and oriented to person, place, and time.  Skin: Skin is warm and dry. No rash noted.  Psychiatric: She has a normal mood and affect. Her behavior is normal.    ED Course  Procedures (including critical care time) Labs Review Labs Reviewed  POCT URINALYSIS DIP (DEVICE) - Abnormal; Notable for the following:    Glucose, UA 500 (*)    All other components within normal limits   Imaging Review Dg Chest 2 View  12/23/2013   CLINICAL DATA:  Difficulty breathing   EXAM: CHEST  2 VIEW  COMPARISON:  April 14, 2013  FINDINGS: Lungs are clear. Heart is upper normal in size with normal pulmonary vascularity. No adenopathy. No bone lesions. There is atherosclerotic change in the aortic arch region.  IMPRESSION: No edema or consolidation.   Electronically Signed   By: Lowella Grip M.D.   On: 12/23/2013 14:09     MDM   1. Abdominal pain    Exam suggests possible issue with gall bladder. CXR unremarkable. UA only notable for glucosuria. No hematuria or signs of infection. Patient is hemodynamically stable and will be transferred to [facility] for possible abdominal U/S and further evaluation.   [place], [place] 12/23/13 [phone]

## 2013-12-23 NOTE — ED Notes (Signed)
Pt. reports right abdominal pain for 2 days , denies nausea /vomitting or diarrhea , seen here this afternoon - blood tests done / x-ray and ultrasound done but left AMA .

## 2013-12-24 ENCOUNTER — Emergency Department (HOSPITAL_COMMUNITY): Payer: No Typology Code available for payment source

## 2013-12-24 ENCOUNTER — Emergency Department (HOSPITAL_COMMUNITY)
Admission: EM | Admit: 2013-12-24 | Discharge: 2013-12-24 | Disposition: A | Discharge status: Home / Self Care | Payer: No Typology Code available for payment source | Attending: Emergency Medicine | Admitting: Emergency Medicine

## 2013-12-24 ENCOUNTER — Encounter (HOSPITAL_COMMUNITY): Payer: Self-pay | Admitting: Radiology

## 2013-12-24 DIAGNOSIS — M792 Neuralgia and neuritis, unspecified: Secondary | ICD-10-CM

## 2013-12-24 HISTORY — DX: Obesity, unspecified: E66.9

## 2013-12-24 MED ORDER — IOHEXOL 300 MG/ML  SOLN
100.0000 mL | Freq: Once | INTRAMUSCULAR | Status: AC | PRN
  Administered 2013-12-24: 100 mL via INTRAVENOUS

## 2013-12-24 MED ORDER — IOHEXOL 300 MG/ML  SOLN
25.0000 mL | Freq: Once | INTRAMUSCULAR | Status: AC | PRN
  Administered 2013-12-24: 25 mL via ORAL

## 2013-12-24 NOTE — ED Notes (Addendum)
Pt requesting to leave AMA. Informed pt that she has already done her CT and we are waiting for Dr to review. Pt agrees to stay at this time. Pt removed BP cuff and does not want BP cuff placed back on.

## 2013-12-24 NOTE — ED Notes (Addendum)
Pt received dc instructions. All questions answered. Pt refuses to sign for discharge. Refused dc VS.

## 2013-12-24 NOTE — ED Notes (Signed)
Pt requesting pain medication. Dr Lita Mains aware. Non ordered at this time.

## 2013-12-24 NOTE — ED Notes (Signed)
Pt taken to CT.

## 2013-12-24 NOTE — ED Provider Notes (Signed)
CSN: [id number]     Arrival date & time 12/23/13  2211 History   First MD Initiated Contact with Patient 12/24/13 0032     Chief Complaint  Patient presents with  . Abdominal Pain     (Consider location/radiation/quality/duration/timing/severity/associated sxs/prior Treatment) HPI Presents with right-sided abdominal pain. She was seen yesterday at urgent care and sent to the emergency department for ultrasound to rule out gallbladder disease. Ultrasound was essentially negative. Patient was then scheduled for a CT scan but she left prior to that tests being performed. Her lab work was essentially normal including her LFTs and white blood cell count. Patient states the pain wraps around the right side of her abdomen and describes the sensation as burning on the skin. She is tender to very light touch in the skin area. She's had no rashes. Denies any trauma. She has no associated nausea, vomiting or diarrhea. She has no associated urinary symptoms including dysuria, frequency, urgency or flank pain. She's had no fever or chills. The pain is not associated with food. Patient has a history of diabetes and neuropathy. She's already taking gabapentin. Patient has no shortness of breath and no pain with inspiration. She denies any chest pain. She's had no new lower extremity swelling or pain. Past Medical History  Diagnosis Date  . Diabetes mellitus type II, [name]   . Frequent headaches   . Chronic back pain   . Chronic kidney disease 03/2013    failure due to meds  . Chest pain   . Obesity    Past Surgical History  Procedure Laterality Date  . Cesarean section    . Knee surgery    . Thyroid surgery     Family History  Problem Relation Age of Onset  . Cancer Mother     cervical- mets   History  Substance Use Topics  . Smoking status: Never Smoker    . Smokeless tobacco: Never Used  . Alcohol Use: No   OB History   Grav Para Term Preterm Abortions TAB SAB Ect Mult Living   4 3 2 1 1   1 2 6      Review of Systems  Constitutional: Negative for fever, chills and fatigue.  Respiratory: Negative for shortness of breath.   Cardiovascular: Negative for chest pain.  Gastrointestinal: Positive for abdominal pain. Negative for nausea, vomiting, diarrhea and constipation.  Genitourinary: Negative for dysuria, frequency, hematuria and flank pain.  Musculoskeletal: Negative for back pain, myalgias, neck pain and neck stiffness.  Skin: Negative for rash and wound.  Neurological: Negative for dizziness, weakness, light-headedness, numbness and headaches.  All other systems reviewed and are negative.      Allergies  Amoxicillin-pot clavulanate and Sulfa antibiotics  Home Medications   Current Outpatient Rx  Name  Route  Sig  Dispense  Refill  . Dapagliflozin Propanediol (FARXIGA) 10 MG TABS   Oral   Take 10 mg by mouth daily.   30 tablet   3   . fluticasone (FLONASE) 50 MCG/ACT nasal spray   Each Nare   Place 2 sprays into both nostrils daily.   16 g   5   . gabapentin (NEURONTIN) 300 MG capsule   Oral   Take 1 capsule (300 mg total) by mouth 4 (four) times daily.   [place]  capsule   5   . insulin aspart (NOVOLOG) 100 UNIT/ML FlexPen   Subcutaneous   Inject 30 Units into the skin 4 (four) times daily.         . insulin glargine (LANTUS) 100 UNIT/ML injection   Subcutaneous   Inject 0.6 mLs (60 Units total) into the skin at bedtime.   10 mL   12   . isosorbide mononitrate (IMDUR) 30 MG 24 hr tablet   Oral   Take 1 tablet (30 mg total) by mouth daily.   30 tablet   6   . levothyroxine (SYNTHROID, LEVOTHROID) 100 MCG tablet   Oral   Take 1 tablet (100 mcg total) by mouth daily.   30 tablet   5   . lisinopril (PRINIVIL,ZESTRIL) 40 MG tablet   Oral   Take 1 tablet (40 mg total) by mouth daily.   90 tablet    3   . loratadine (CLARITIN) 10 MG tablet   Oral   Take 1 tablet (10 mg total) by mouth daily.   30 tablet   3   . methocarbamol (ROBAXIN) 500 MG tablet   Oral   Take 1 tablet (500 mg total) by mouth 4 (four) times daily.   30 tablet   2   . metoprolol succinate (TOPROL-XL) 50 MG 24 hr tablet   Oral   Take 50 mg by mouth 2 (two) times daily. Take with or immediately following a meal.         . Multiple Vitamins-Minerals (EYE VITAMINS) CAPS   Oral   Take 1 capsule by mouth daily.         . nitroGLYCERIN (NITROSTAT) 0.4 MG SL tablet   Sublingual   Place 0.4 mg under the tongue every 5 (five) minutes as needed for chest pain.         Marland Kitchen oxyCODONE-acetaminophen (PERCOCET/ROXICET) 5-325 MG per tablet   Oral   Take 1 tablet by mouth every 8 (eight) hours as needed for pain.   15 tablet   0   . pantoprazole (PROTONIX) 40 MG tablet   Oral   Take 40 mg by mouth 2 (two) times daily.         . sertraline (ZOLOFT) 50 MG tablet   Oral   Take 1 tablet (50 mg total) by mouth daily.   30 tablet   3   . traMADol (ULTRAM) 50 MG tablet   Oral   Take 100 mg by mouth every 6 (six) hours as needed for severe pain.         . diazepam (VALIUM) 5 MG tablet   Oral   Take 1 tablet (5 mg total) by mouth every 12 (twelve) hours as needed for anxiety.   60 tablet   1   . simvastatin (ZOCOR) 20 MG tablet   Oral   Take 1 tablet (20 mg total) by mouth at bedtime.   30 tablet   5    BP 136/83  Pulse 91  Temp(Src) 97.9 F (36.6 C) (Oral)  Resp 17  SpO2 97% Physical Exam  Nursing note and vitals reviewed. Constitutional: She is oriented to person, place, and time. She appears well-developed and well-nourished. No distress.  HENT:  Head: Normocephalic and atraumatic.  Mouth/Throat: Oropharynx is clear and moist.  Eyes: EOM are normal. Pupils are equal, round, and reactive to light.  Neck: Normal range of motion. Neck supple.  Cardiovascular: Normal rate and regular rhythm.    Pulmonary/Chest: Effort normal  and breath sounds normal. No respiratory distress. She has no wheezes. She has no rales.  Abdominal: Soft. Bowel sounds are normal. She exhibits no distension and no mass. There is tenderness. There is no rebound and no guarding.  Patient's pain is reproduced with very light palpation of the skin in a bandlike pattern extending under the right costal border from the mid axillary line all the way to the midline of the abdomen. She has no rebound or guarding. The pain is not worsened with deep palpation.  Musculoskeletal: Normal range of motion. She exhibits no edema and no tenderness.  No calf swelling or tenderness.  Neurological: She is alert and oriented to person, place, and time.  Patient is alert and oriented x3 with clear, goal oriented speech. Patient has 5/5 motor in all extremities. Sensation is intact to light touch. Bilateral finger-to-nose is normal with no signs of dysmetria. Patient has a normal gait and walks without assistance.   Skin: Skin is warm and dry. No rash noted. No erythema.  No rashes especially in the abdominal region noted.  Psychiatric:  Anxious    ED Course  Procedures (including critical care time) Labs Review Labs Reviewed - No data to display Imaging Review Dg Chest 2 View  12/23/2013   CLINICAL DATA:  Difficulty breathing  EXAM: CHEST  2 VIEW  COMPARISON:  April 14, 2013  FINDINGS: Lungs are clear. Heart is upper normal in size with normal pulmonary vascularity. No adenopathy. No bone lesions. There is atherosclerotic change in the aortic arch region.  IMPRESSION: No edema or consolidation.   Electronically Signed   By: Lowella Grip M.D.   On: 12/23/2013 14:09   US Abdomen Complete  12/23/2013   CLINICAL DATA:  Abdominal pain  EXAM: ULTRASOUND ABDOMEN COMPLETE  COMPARISON:  CT abdomen and pelvis August 07, 2011  FINDINGS: Gallbladder:  No gallstones or wall thickening visualized. There is no pericholecystic fluid. No  sonographic Murphy sign noted.  Common bile duct:  Diameter: 4 mm. There is no intrahepatic, common hepatic, or common bile duct dilatation.  Liver:  No focal lesion identified.  Liver echogenicity is increased.  IVC:  No abnormality visualized.  Pancreas:  No mass or inflammatory focus.  Spleen:  Size and appearance within normal limits.  Right Kidney:  Length: 12.3 cm. Echogenicity within normal limits. No mass or hydronephrosis visualized.  Left Kidney:  Length: 13.1 cm. Echogenicity within normal limits. No mass or hydronephrosis visualized.  Abdominal aorta:  No aneurysm visualized.  Mild atherosclerotic change is noted.  Other findings:  No demonstrable ascites.  IMPRESSION: The liver echogenicity is somewhat increased, most likely due to hepatic steatosis. While no focal liver lesions are identified, it must be cautioned that the sensitivity of ultrasound for focal liver lesion is diminished with underlying hepatic steatosis.  There is mild atherosclerotic change in the aorta.  Study otherwise unremarkable.   Electronically Signed   By: Lowella Grip M.D.   On: 12/23/2013 17:21     EKG Interpretation None      MDM   Final diagnoses:  None    I have low suspicion for intra-abdominal pathology. I think likely her symptoms are related to neuropathic pain. Given her anxiety and previous discussion of obtaining a CT scan, we'll proceed. I do not believe that labs need to be repeated at this point.  CT without any acute findings.  Patient is tearful in the room. She is upset that no diagnosis has been found and  the length of time to get her CT scan. When asked if there was anything else going on she expressed increased stress and anxiety regarding her sister's medical conditions.   Patient's vital signs remain normal. I think she has been thoroughly medically screened for any type of emergent condition. I believe the pain is neuropathic in nature and have suggested she continue her  gabapentin and followup with primary MD. Return precautions have been given.   Julianne Rice, MD 12/24/13 [phone]

## 2013-12-24 NOTE — ED Provider Notes (Signed)
Medical screening examination/treatment/procedure(s) were performed by non-physician practitioner and as supervising physician I was immediately available for consultation/collaboration.   Neta Ehlers, MD 12/24/13 1045

## 2013-12-24 NOTE — Discharge Instructions (Signed)

## 2013-12-24 NOTE — ED Notes (Signed)
Pt back from CT

## 2014-01-03 ENCOUNTER — Ambulatory Visit: Payer: No Typology Code available for payment source | Attending: Internal Medicine | Admitting: Internal Medicine

## 2014-01-03 ENCOUNTER — Telehealth: Payer: Self-pay | Admitting: Internal Medicine

## 2014-01-03 VITALS — BP 150/84 | HR 81 | Resp 16

## 2014-01-03 DIAGNOSIS — G894 Chronic pain syndrome: Secondary | ICD-10-CM

## 2014-01-03 MED ORDER — DICLOFENAC SODIUM 1 % TD GEL: 2.0000 g | Freq: Four times a day (QID) | TRANSDERMAL | Status: DC

## 2014-01-03 NOTE — Telephone Encounter (Signed)
Pt came in today to let us know that she is having burning of her skin, pt states that her clothes touching her skin hurts. Please contact pt as soon as possible

## 2014-01-05 ENCOUNTER — Other Ambulatory Visit: Payer: Self-pay | Admitting: Emergency Medicine

## 2014-01-05 MED ORDER — PANTOPRAZOLE SODIUM 40 MG PO TBEC: 40.0000 mg | DELAYED_RELEASE_TABLET | Freq: Two times a day (BID) | ORAL | Status: DC

## 2014-01-05 NOTE — Progress Notes (Signed)
   Subjective:    [name], female    DOB: August 21, 1951, 63 y.o.   MRN: [id number]  HPI Patient came to the clinic requesting for referral for endoscopic evaluation of abdominal pain. She complains of abdominal pain in the right upper part that started about 2 weeks ago. She described the pain as burning from her and he reads all the way to the mid abdomen. No inciting trauma. No change in bowel habit. No fever. She has had CT abdomen with with contrast which only demonstrated fatty liver, abdominal ultrasound was done as well which ruled out cholelithiasis and cholecystitis.   Review of Systems All systems were reviewed and negative except for above  Objective:  Physical Exam Vital signs: Blood pressure is 150/84 mmHg, respiratory rate of 16, SpO2 93% on RA  Abdomen: Mild tenderness around the right hypochondriac region and right lower ribs,  No hepatomegaly, negative Murphy's sign. Bowel sounds active Assessment & Plan:  Abdominal pain: Possibly herpes zoster prodrome Patient was counseled No need for endoscopy Results of CT scan and abdominal ultrasound were explained to patient Pain medication as needed Follow up when necessary  Angelica Chessman, MD, [place], Karilyn Cota [facility] and [facility] [place], [place]

## 2014-01-23 ENCOUNTER — Other Ambulatory Visit: Payer: Self-pay | Admitting: Internal Medicine

## 2014-02-07 ENCOUNTER — Telehealth: Payer: Self-pay | Admitting: Emergency Medicine

## 2014-02-07 ENCOUNTER — Telehealth: Payer: Self-pay | Admitting: Internal Medicine

## 2014-02-07 MED ORDER — BUTALBITAL-APAP-CAFFEINE 50-325-40 MG PO TABS: ORAL_TABLET | ORAL | Status: DC

## 2014-02-07 NOTE — Telephone Encounter (Signed)
Pt calling regarding refill for migraine medication for [facility] outpatient pharmacy, she is experiencing constant headaches and has been waiting for 2 wks refill.  Pt says pharmacy sent request but it was returned stating pt needs appt, she has appt coming up in June and not sure why she would need appt.  Also has abscess on chin (which she has had previously its due to stress, pt had recent death in family) and would like antibiotic sent to [facility] [address]. Please f/u with pt, she would like to speak to nurse asap.

## 2014-02-07 NOTE — Telephone Encounter (Signed)
Pt informed we will refill medication for migraine headache but she will need to schedule OV for antibiotic therapy. Pt verbalized understanding. Script will be at front for pick up.

## 2014-02-08 ENCOUNTER — Other Ambulatory Visit: Payer: Self-pay | Admitting: Internal Medicine

## 2014-02-13 ENCOUNTER — Ambulatory Visit: Payer: No Typology Code available for payment source | Attending: Internal Medicine

## 2014-02-13 NOTE — Progress Notes (Unsigned)
Pt comes in as walk in with c/o continuous right upper quad burning sensation radiating to R flank area Pt c/o nausea with pain but denies vomiting.  After reading Dr. Doreene Burke note, pt was informed she doesn't need Endoscopy at this time based on CT scan Pt told Dr. Doreene Burke with f/u with further evaluation

## 2014-02-19 ENCOUNTER — Other Ambulatory Visit: Payer: Self-pay | Admitting: *Deleted

## 2014-02-19 DIAGNOSIS — I10 Essential (primary) hypertension: Secondary | ICD-10-CM

## 2014-02-19 MED ORDER — LISINOPRIL 40 MG PO TABS: 40.0000 mg | ORAL_TABLET | Freq: Every day | ORAL | Status: DC

## 2014-02-19 NOTE — Telephone Encounter (Signed)
Pharmacy also requesting a refill on Metfromin HCL 1000 mg tablets. Did not see that on the medication list.

## 2014-02-21 ENCOUNTER — Telehealth: Payer: Self-pay | Admitting: Internal Medicine

## 2014-02-21 ENCOUNTER — Other Ambulatory Visit: Payer: Self-pay | Admitting: Internal Medicine

## 2014-02-21 ENCOUNTER — Telehealth: Payer: Self-pay

## 2014-02-21 ENCOUNTER — Other Ambulatory Visit: Payer: Self-pay

## 2014-02-21 ENCOUNTER — Other Ambulatory Visit: Payer: Self-pay | Admitting: Emergency Medicine

## 2014-02-21 DIAGNOSIS — S025XXA Fracture of tooth (traumatic), initial encounter for closed fracture: Secondary | ICD-10-CM

## 2014-02-21 MED ORDER — INSULIN GLARGINE 100 UNIT/ML ~~LOC~~ SOLN: 60.0000 [IU] | Freq: Every day | SUBCUTANEOUS | Status: DC

## 2014-02-21 MED ORDER — INSULIN ASPART 100 UNIT/ML FLEXPEN
30.0000 [IU] | PEN_INJECTOR | Freq: Four times a day (QID) | SUBCUTANEOUS | Status: DC
Start: 2014-02-21 — End: 2014-05-01

## 2014-02-21 NOTE — Telephone Encounter (Signed)
Anita James from health dept called to clarify patient Order on her novolog Per Dr Doreene Burke [name] should be 30units PRN QID for blood sugars >250 Dr Doreene Burke would like her to follow up in office on friday

## 2014-02-21 NOTE — Telephone Encounter (Signed)
Patient called to see if we can get her if Friday with Dr Doreene Burke to discuss Her medications Patient not available Left message to return our call

## 2014-02-22 ENCOUNTER — Other Ambulatory Visit: Payer: Self-pay | Admitting: *Deleted

## 2014-02-22 DIAGNOSIS — E119 Type 2 diabetes mellitus without complications: Secondary | ICD-10-CM

## 2014-02-22 NOTE — Telephone Encounter (Signed)
There is also a refill request from [facility] for Toprol XL 100 mg (take one tablet by mouth daily). Patient medication list has Toprol XL 50 mg tablets two times daily. Please clarify and send prescription to [place].

## 2014-02-23 MED ORDER — INSULIN GLARGINE 100 UNIT/ML ~~LOC~~ SOLN: 60.0000 [IU] | Freq: Every day | SUBCUTANEOUS | Status: DC

## 2014-02-27 ENCOUNTER — Other Ambulatory Visit: Payer: Self-pay | Admitting: *Deleted

## 2014-02-27 DIAGNOSIS — I10 Essential (primary) hypertension: Secondary | ICD-10-CM

## 2014-02-27 NOTE — Telephone Encounter (Signed)
Patient also requesting refill on Metformin HCL 1000 mg (take one tablet by mouth twice daily with meals). Do not see this medication on list.

## 2014-02-28 MED ORDER — LISINOPRIL 40 MG PO TABS: 40.0000 mg | ORAL_TABLET | Freq: Every day | ORAL | Status: DC

## 2014-03-02 ENCOUNTER — Ambulatory Visit: Payer: Self-pay

## 2014-03-08 ENCOUNTER — Ambulatory Visit: Payer: Self-pay | Admitting: Internal Medicine

## 2014-03-21 ENCOUNTER — Other Ambulatory Visit: Payer: Self-pay | Admitting: *Deleted

## 2014-03-21 ENCOUNTER — Telehealth: Payer: Self-pay | Admitting: Internal Medicine

## 2014-03-21 MED ORDER — METOPROLOL SUCCINATE ER 50 MG PO TB24: 100.0000 mg | ORAL_TABLET | Freq: Every day | ORAL | Status: DC

## 2014-03-21 MED ORDER — OLMESARTAN MEDOXOMIL 40 MG PO TABS: 40.0000 mg | ORAL_TABLET | Freq: Every day | ORAL | Status: DC

## 2014-03-21 MED ORDER — GABAPENTIN 300 MG PO CAPS: 300.0000 mg | ORAL_CAPSULE | Freq: Four times a day (QID) | ORAL | Status: DC

## 2014-03-21 NOTE — Progress Notes (Signed)
Pt called from the [facility]. Needing refills on 3 medications. I refilled her medications. The pharmacist requested if we could switch the lisinapril to benicar. After speaking to the nurse we changed the lisinopril to benicar.

## 2014-03-21 NOTE — Telephone Encounter (Signed)
Pt calling about bp med refill.  She says the [facility] has been sending requests for refill since 02/21/14 and have not received call back. Pt is calling from the [facility] and says she needs refill today. Please f/u with pt.

## 2014-03-21 NOTE — Telephone Encounter (Signed)
Medication refilled and changed per Rachel Bo and faxed to health department

## 2014-03-27 ENCOUNTER — Ambulatory Visit: Payer: Self-pay

## 2014-04-05 ENCOUNTER — Ambulatory Visit: Payer: Self-pay | Attending: Internal Medicine

## 2014-05-01 ENCOUNTER — Other Ambulatory Visit: Payer: Self-pay | Admitting: *Deleted

## 2014-05-01 DIAGNOSIS — E1110 Type 2 diabetes mellitus with ketoacidosis without coma: Secondary | ICD-10-CM

## 2014-05-01 MED ORDER — INSULIN ASPART 100 UNIT/ML FLEXPEN: 30.0000 [IU] | PEN_INJECTOR | Freq: Four times a day (QID) | SUBCUTANEOUS | Status: DC

## 2014-05-11 ENCOUNTER — Other Ambulatory Visit: Payer: Self-pay | Admitting: *Deleted

## 2014-05-11 DIAGNOSIS — E119 Type 2 diabetes mellitus without complications: Secondary | ICD-10-CM

## 2014-05-11 MED ORDER — INSULIN GLARGINE 100 UNIT/ML ~~LOC~~ SOLN: 60.0000 [IU] | Freq: Every day | SUBCUTANEOUS | Status: DC

## 2014-05-14 ENCOUNTER — Encounter (HOSPITAL_COMMUNITY): Payer: Self-pay | Admitting: Emergency Medicine

## 2014-05-14 ENCOUNTER — Emergency Department (HOSPITAL_COMMUNITY)
Admission: EM | Admit: 2014-05-14 | Discharge: 2014-05-14 | Disposition: A | Discharge status: Home / Self Care | Payer: Self-pay | Attending: Emergency Medicine | Admitting: Emergency Medicine

## 2014-05-14 DIAGNOSIS — Z79899 Other long term (current) drug therapy: Secondary | ICD-10-CM | POA: Insufficient documentation

## 2014-05-14 DIAGNOSIS — E669 Obesity, unspecified: Secondary | ICD-10-CM | POA: Insufficient documentation

## 2014-05-14 DIAGNOSIS — Z880 Allergy status to penicillin: Secondary | ICD-10-CM | POA: Insufficient documentation

## 2014-05-14 DIAGNOSIS — F411 Generalized anxiety disorder: Secondary | ICD-10-CM | POA: Insufficient documentation

## 2014-05-14 DIAGNOSIS — G589 Mononeuropathy, unspecified: Secondary | ICD-10-CM | POA: Insufficient documentation

## 2014-05-14 DIAGNOSIS — G8929 Other chronic pain: Secondary | ICD-10-CM | POA: Insufficient documentation

## 2014-05-14 DIAGNOSIS — R110 Nausea: Secondary | ICD-10-CM | POA: Insufficient documentation

## 2014-05-14 DIAGNOSIS — Z794 Long term (current) use of insulin: Secondary | ICD-10-CM | POA: Insufficient documentation

## 2014-05-14 DIAGNOSIS — R209 Unspecified disturbances of skin sensation: Secondary | ICD-10-CM | POA: Insufficient documentation

## 2014-05-14 DIAGNOSIS — Z8719 Personal history of other diseases of the digestive system: Secondary | ICD-10-CM | POA: Insufficient documentation

## 2014-05-14 DIAGNOSIS — E119 Type 2 diabetes mellitus without complications: Secondary | ICD-10-CM | POA: Insufficient documentation

## 2014-05-14 DIAGNOSIS — R200 Anesthesia of skin: Secondary | ICD-10-CM

## 2014-05-14 DIAGNOSIS — E785 Hyperlipidemia, unspecified: Secondary | ICD-10-CM | POA: Insufficient documentation

## 2014-05-14 DIAGNOSIS — N189 Chronic kidney disease, unspecified: Secondary | ICD-10-CM | POA: Insufficient documentation

## 2014-05-14 DIAGNOSIS — I129 Hypertensive chronic kidney disease with stage 1 through stage 4 chronic kidney disease, or unspecified chronic kidney disease: Secondary | ICD-10-CM | POA: Insufficient documentation

## 2014-05-14 DIAGNOSIS — E039 Hypothyroidism, unspecified: Secondary | ICD-10-CM | POA: Insufficient documentation

## 2014-05-14 DIAGNOSIS — IMO0002 Reserved for concepts with insufficient information to code with codable children: Secondary | ICD-10-CM | POA: Insufficient documentation

## 2014-05-14 DIAGNOSIS — Z8701 Personal history of pneumonia (recurrent): Secondary | ICD-10-CM | POA: Insufficient documentation

## 2014-05-14 NOTE — Discharge Instructions (Signed)
Peripheral Neuropathy °Peripheral neuropathy is a type of nerve damage. It affects nerves that carry signals between the spinal cord and other parts of the body. These are called peripheral nerves. With peripheral neuropathy, one nerve or a group of nerves may be damaged.  °CAUSES  °Many things can damage peripheral nerves. For some people with peripheral neuropathy, the cause is unknown. Some causes include: °· Diabetes. This is the most common cause of peripheral neuropathy. °· Injury to a nerve. °· Pressure or stress on a nerve that lasts a long time. °· Too little vitamin B. Alcoholism can lead to this. °· Infections. °· Autoimmune diseases, such as multiple sclerosis and systemic lupus erythematosus. °· Inherited nerve diseases. °· Some medicines, such as cancer drugs. °· Toxic substances, such as lead and mercury. °· Too little blood flowing to the legs. °· Kidney disease. °· Thyroid disease. °SIGNS AND SYMPTOMS  °Different people have different symptoms. The symptoms you have will depend on which of your nerves is damaged.  Common symptoms include: °· Loss of feeling (numbness) in the feet and hands. °· Tingling in the feet and hands. °· Pain that burns. °· Very sensitive skin. °· Weakness. °· Not being able to move a part of the body (paralysis). °· Muscle twitching. °· Clumsiness or poor coordination. °· Loss of balance. °· Not being able to control your bladder. °· Feeling dizzy. °· Sexual problems. °DIAGNOSIS  °Peripheral neuropathy is a symptom, not a disease. Finding the cause of peripheral neuropathy can be hard. To figure that out, your health care provider will take a medical history and do a physical exam. A neurological exam will also be done. This involves checking things affected by your brain, spinal cord, and nerves (nervous system). For example, your health care provider will check your reflexes, how you move, and what you can feel.  °Other types of tests may also be ordered, such as: °· Blood  tests. °· A test of the fluid in your spinal cord. °· Imaging tests, such as CT scans or an MRI. °· Electromyography (EMG). This test checks the nerves that control muscles. °· Nerve conduction velocity tests. These tests check how fast messages pass through your nerves. °· Nerve biopsy. A small piece of nerve is removed. It is then checked under a microscope. °TREATMENT  °· Medicine is often used to treat peripheral neuropathy. Medicines may include: °¨ Pain-relieving medicines. Prescription or over-the-counter medicine may be suggested. °¨ Antiseizure medicine. This may be used for pain. °¨ Antidepressants. These also may help ease pain from neuropathy. °¨ Lidocaine. This is a numbing medicine. You might wear a patch or be given a shot. °¨ Mexiletine. This medicine is typically used to help control irregular heart rhythms. °· Surgery. Surgery may be needed to relieve pressure on a nerve or to destroy a nerve that is causing pain. °· Physical therapy to help movement. °· Assistive devices to help movement. °HOME CARE INSTRUCTIONS  °· Only take over-the-counter or prescription medicines as directed by your health care provider. Follow the instructions carefully for any given medicines. Do not take any other medicines without first getting approval from your health care provider. °· If you have diabetes, work closely with your health care provider to keep your blood sugar under control. °· If you have numbness in your feet: °¨ Check every day for signs of injury or infection. Watch for redness, warmth, and swelling. °¨ Wear padded socks and comfortable shoes. These help protect your feet. °· Do not do   things that put pressure on your damaged nerve.  Do not smoke. Smoking keeps blood from getting to damaged nerves.  Avoid or limit alcohol. Too much alcohol can cause a lack of B vitamins. These vitamins are needed for healthy nerves.  Develop a good support system. Coping with peripheral neuropathy can be  stressful. Talk to a mental health specialist or join a support group if you are struggling.  Follow up with your health care provider as directed. SEEK MEDICAL CARE IF:   You have new signs or symptoms of peripheral neuropathy.  You are struggling emotionally from dealing with peripheral neuropathy.  You have a fever. SEEK IMMEDIATE MEDICAL CARE IF:   You have an injury or infection that is not healing.  You feel very dizzy or begin vomiting.  You have chest pain.  You have trouble breathing. Document Released: 08/28/2002 Document Revised: 05/20/2011 Document Reviewed: 05/15/2013 [facility] Patient Information 2015 [place], [place]. This information is not intended to replace advice given to you by your health care provider. Make sure you discuss any questions you have with your health care provider. Diabetic Neuropathy Diabetic neuropathy is a nerve disease or nerve damage that is caused by diabetes mellitus. About half of all people with diabetes mellitus have some form of nerve damage. Nerve damage is more common in those who have had diabetes mellitus for many years and who generally have not had good control of their blood sugar (glucose) level. Diabetic neuropathy is a common complication of diabetes mellitus. There are three more common types of diabetic neuropathy and a fourth type that is less common and less understood:   Peripheral neuropathy--This is the most common type of diabetic neuropathy. It causes damage to the nerves of the feet and legs first and then eventually the hands and arms.The damage affects the ability to sense touch.  Autonomic neuropathy--This type causes damage to the autonomic nervous system, which controls the following functions:  Heartbeat.  Body temperature.  Blood pressure.  Urination.  Digestion.  Sweating.  Sexual function.  Focal neuropathy--Focal neuropathy can be painful and unpredictable and occurs most often in older adults with  diabetes mellitus. It involves a specific nerve or one area and often comes on suddenly. It usually does not cause long-term problems.  Radiculoplexus neuropathy-- Sometimes called lumbosacral radiculoplexus neuropathy, radiculoplexus neuropathy affects the nerves of the thighs, hips, buttocks, or legs. It is more common in people with type 2 diabetes mellitus and in older men. It is characterized by debilitating pain, weakness, and atrophy, usually in the thigh muscles. CAUSES  The cause of peripheral, autonomic, and focal neuropathies is diabetes mellitus that is uncontrolled and high glucose levels. The cause of radiculoplexus neuropathy is unknown. However, it is thought to be caused by inflammation related to uncontrolled glucose levels. SIGNS AND SYMPTOMS  Peripheral Neuropathy Peripheral neuropathy develops slowly over time. When the nerves of the feet and legs no longer work there may be:   Burning, stabbing, or aching pain in the legs or feet.  Inability to feel pressure or pain in your feet. This can lead to:  Thick calluses over pressure areas.  Pressure sores.  Ulcers.  Foot deformities.  Reduced ability to feel temperature changes.  Muscle weakness. Autonomic Neuropathy The symptoms of autonomic neuropathy vary depending on which nerves are affected. Symptoms may include:  Problems with digestion, such as:  Feeling sick to your stomach (nausea).  Vomiting.  Bloating.  Constipation.  Diarrhea.  Abdominal pain.  Difficulty with  urination. This occurs if you lose your ability to sense when your bladder is full. Problems include:  Urine leakage (incontinence).  Inability to empty your bladder completely (retention).  Rapid or irregular heartbeat (palpitations).  Blood pressure drops when you stand up (orthostatic hypotension). When you stand up you may feel:  Dizzy.  Weak.  Faint.  In men, inability to attain and maintain an erection.  In women,  vaginal dryness and problems with decreased sexual desire and arousal.  Problems with body temperature regulation.  Increased or decreased sweating. Focal Neuropathy  Abnormal eye movements or abnormal alignment of both eyes.  Weakness in the wrist.  Foot drop. This results in an inability to lift the foot properly and abnormal walking or foot movement.  Paralysis on one side of your face (Bell palsy).  Chest or abdominal pain. Radiculoplexus Neuropathy  Sudden, severe pain in your hip, thigh, or buttocks.  Weakness and wasting of thigh muscles.  Difficulty rising from a seated position.  Abdominal swelling.  Unexplained weight loss (usually more than 10 lb [4.5 kg]). DIAGNOSIS  Peripheral Neuropathy Your senses may be tested. Sensory function testing can be done with:  A light touch using a monofilament.  A vibration with tuning fork.  A sharp sensation with a pin prick. Other tests that can help diagnose neuropathy are:  Nerve conduction velocity. This test checks the transmission of an electrical current through a nerve.  Electromyography. This shows how muscles respond to electrical signals transmitted by nearby nerves.  Quantitative sensory testing. This is used to assess how your nerves respond to vibrations and changes in temperature. Autonomic Neuropathy Diagnosis is often based on reported symptoms. Tell your health care provider if you experience:   Dizziness.   Constipation.   Diarrhea.   Inappropriate urination or inability to urinate.   Inability to get or maintain an erection.  Tests that may be done include:   Electrocardiography or Holter monitor. These are tests that can help show problems with the heart rate or heart rhythm.   An X-ray exam may be done. Focal Neuropathy Diagnosis is made based on your symptoms and what your health care provider finds during your exam. Other tests may be done. They may include:  Nerve conduction  velocities. This checks the transmission of electrical current through a nerve.  Electromyography. This shows how muscles respond to electrical signals transmitted by nearby nerves.  Quantitative sensory testing. This test is used to assess how your nerves respond to vibration and changes in temperature. Radiculoplexus Neuropathy  Often the first thing is to eliminate any other issue or problems that might be the cause, as there is no stick test for diagnosis.  X-ray exam of your spine and lumbar region.  Spinal tap to rule out cancer.  MRI to rule out other lesions. TREATMENT  Once nerve damage occurs, it cannot be reversed. The goal of treatment is to keep the disease or nerve damage from getting worse and affecting more nerve fibers. Controlling your blood glucose level is the key. Most people with radiculoplexus neuropathy see at least a partial improvement over time. You will need to keep your blood glucose and HbA1c levels in the target range determined by your health care provider. Things that help control blood glucose levels include:   Blood glucose monitoring.   Meal planning.   Physical activity.   Diabetes medicine.  Over time, maintaining lower blood glucose levels helps lessen symptoms. Sometimes, prescription pain medicine is needed. HOME CARE  INSTRUCTIONS:  Do not smoke.  Keep your blood glucose level in the range that you and your health care provider have determined acceptable for you.  Keep your blood pressure level in the range that you and your health care provider have determined acceptable for you.  Eat a well-balanced diet.  Be active every day.  Check your feet every day. SEEK MEDICAL CARE IF:   You have burning, stabbing, or aching pain in the legs or feet.  You are unable to feel pressure or pain in your feet.  You develop problems with digestion such as:  Nausea.  Vomiting.  Bloating.  Constipation.  Diarrhea.  Abdominal  pain.  You have difficulty with urination, such as:  Incontinence.  Retention.  You have palpitations.  You develop orthostatic hypotension. When you stand up you may feel:  Dizzy.  Weak.  Faint.  You cannot attain and maintain an erection (in men).  You have vaginal dryness and problems with decreased sexual desire and arousal (in women).  You have severe pain in your thighs, legs, or buttocks.  You have unexplained weight loss. Document Released: 11/16/2001 Document Revised: 06/28/2013 Document Reviewed: 02/16/2013 [facility] Patient Information 2015 [place], [place]. This information is not intended to replace advice given to you by your health care provider. Make sure you discuss any questions you have with your health care provider.

## 2014-05-14 NOTE — ED Provider Notes (Signed)
CSN: [id number]     Arrival date & time 05/14/14  1612 History   First MD Initiated Contact with Patient 05/14/14 1859     Chief Complaint  Patient presents with  . numbness      (Consider location/radiation/quality/duration/timing/severity/associated sxs/prior Treatment) Patient is a 63 y.o. female presenting with general illness.  Illness Location:  Bil 4-5th fingers Quality:  Numbness Severity:  Severe Onset quality:  Unable to specify Duration:  1 day Timing:  Constant Progression:  Unchanged Chronicity:  New Context:  DM with peripheral neuropathy, DDD, chronic thumb tingling Relieved by:  Nothing Worsened by:  Nothing Associated symptoms: nausea (very brief and intermittent with looking at vomiting children)   Associated symptoms: no abdominal pain, no chest pain, no congestion, no cough, no diarrhea, no fatigue, no fever, no rash, no rhinorrhea, no shortness of breath, no sore throat and no vomiting     Past Medical History  Diagnosis Date  . Diabetes mellitus type II, [name]   . Frequent headaches   . Chronic back pain   . Chronic kidney disease 03/2013    failure due to meds  . Chest pain   . Obesity    Past Surgical History  Procedure Laterality Date  . Cesarean section    . Knee surgery    . Thyroid surgery     Family History  Problem Relation Age of Onset  . Cancer Mother     cervical- mets   History  Substance Use Topics  . Smoking status: Never Smoker   . Smokeless tobacco: Never Used  . Alcohol Use: No   OB History   Grav Para Term Preterm Abortions TAB SAB Ect Mult Living   4 3 2 1 1   1 2 6      Review of Systems  Constitutional: Negative for fever, chills and fatigue.  HENT: Negative for congestion, rhinorrhea and sore throat.   Eyes: Negative for photophobia and visual disturbance.   Respiratory: Negative for cough and shortness of breath.   Cardiovascular: Negative for chest pain and leg swelling.  Gastrointestinal: Positive for nausea (very brief and intermittent with looking at vomiting children). Negative for vomiting, abdominal pain, diarrhea and constipation.  Endocrine: Negative for polyphagia and polyuria.  Genitourinary: Negative for dysuria, flank pain, vaginal bleeding, vaginal discharge and enuresis.  Musculoskeletal: Negative for back pain and gait problem.  Skin: Negative for color change and rash.  Neurological: Negative for dizziness, syncope, light-headedness and numbness.  Hematological: Negative for adenopathy. Does not bruise/bleed easily.  All other systems reviewed and are negative.     Allergies  Amoxicillin-pot clavulanate and Sulfa antibiotics  Home Medications   Prior to Admission medications   Medication Sig Start Date End Date Taking? Authorizing Provider  butalbital-acetaminophen-caffeine (FIORICET, ESGIC) [phone] MG per tablet Take 1-2 tablets every 6 hours as needed for headache or migraine 02/07/14   Tresa Garter, MD  Dapagliflozin Propanediol (FARXIGA) 10 MG TABS Take 10 mg by mouth daily. 12/05/13   Tresa Garter, MD  diazepam (VALIUM) 5 MG tablet Take 1 tablet (5 mg total) by mouth every 12 (twelve) hours as needed for anxiety. 07/27/13   Robbie Lis, MD  diclofenac sodium (VOLTAREN) 1 % GEL Apply 2 g topically 4 (four) times daily. 01/03/14   Olugbemiga  Essie Christine, MD  fluticasone (FLONASE) 50 MCG/ACT nasal spray Place 2 sprays into both nostrils daily. 07/27/13   Robbie Lis, MD  gabapentin (NEURONTIN) 300 MG capsule Take 1 capsule (300 mg total) by mouth 4 (four) times daily. 03/21/14   Tresa Garter, MD  insulin aspart (NOVOLOG) 100 UNIT/ML FlexPen Inject 30 Units into the skin 4 (four) times daily. 05/01/14   Tresa Garter, MD  insulin glargine (LANTUS) 100 UNIT/ML injection Inject 0.6 mLs (60 Units total)  into the skin at bedtime. 05/11/14   Tresa Garter, MD  isosorbide mononitrate (IMDUR) 30 MG 24 hr tablet Take 1 tablet (30 mg total) by mouth daily. 07/27/13   Robbie Lis, MD  levothyroxine (SYNTHROID, LEVOTHROID) 100 MCG tablet Take 1 tablet (100 mcg total) by mouth daily. 07/27/13   Robbie Lis, MD  lisinopril (PRINIVIL,ZESTRIL) 40 MG tablet Take 1 tablet (40 mg total) by mouth daily.    Tresa Garter, MD  loratadine (CLARITIN) 10 MG tablet Take 1 tablet (10 mg total) by mouth daily. 06/22/13   Clanford Marisa Hua, MD  methocarbamol (ROBAXIN) 500 MG tablet Take 1 tablet (500 mg total) by mouth 4 (four) times daily. 03/09/13   Robbie Lis, MD  metoprolol succinate (TOPROL-XL) 50 MG 24 hr tablet Take 2 tablets (100 mg total) by mouth daily. Take with or immediately following a meal. 03/21/14   Tresa Garter, MD  Multiple Vitamins-Minerals (EYE VITAMINS) CAPS Take 1 capsule by mouth daily. 08/11/12   Thurnell Lose, MD  nitroGLYCERIN (NITROSTAT) 0.4 MG SL tablet Place 0.4 mg under the tongue every 5 (five) minutes as needed for chest pain. 10/06/12   Thurnell Lose, MD  olmesartan (BENICAR) 40 MG tablet Take 1 tablet (40 mg total) by mouth daily. 03/21/14   Tresa Garter, MD  oxyCODONE-acetaminophen (PERCOCET/ROXICET) 5-325 MG per tablet Take 1 tablet by mouth every 8 (eight) hours as needed for pain. 04/15/13   Velvet Bathe, MD  pantoprazole (PROTONIX) 40 MG tablet Take 1 tablet (40 mg total) by mouth 2 (two) times daily. 01/05/14   Tresa Garter, MD  sertraline (ZOLOFT) 50 MG tablet Take 1 tablet (50 mg total) by mouth daily. 09/12/13   Tresa Garter, MD  simvastatin (ZOCOR) 20 MG tablet Take 1 tablet (20 mg total) by mouth at bedtime. 07/27/13   Robbie Lis, MD  traMADol (ULTRAM) 50 MG tablet Take 100 mg by mouth every 6 (six) hours as needed for severe pain.    Historical Provider, MD   BP 153/102  Pulse 89  Temp(Src) 98.2 F (36.8 C) (Oral)  Resp 18  Wt  260 lb (117.935 kg)  SpO2 99% Physical Exam  Vitals reviewed. Constitutional: She is oriented to person, place, and time. She appears well-developed and well-nourished.  HENT:  Head: Normocephalic and atraumatic.  Right Ear: External ear normal.  Left Ear: External ear normal.  Eyes: Conjunctivae and EOM are normal. Pupils are equal, round, and reactive to light.  Neck: Normal range of motion. Neck supple.  Cardiovascular: Normal rate, regular rhythm, normal heart sounds and intact distal pulses.   Pulmonary/Chest: Effort normal and breath sounds normal.  Abdominal: Soft. Bowel sounds are normal. There is no tenderness.  Musculoskeletal: Normal range of motion.  Neurological: She is alert and oriented to person, place, and time. She has normal strength and normal reflexes. A sensory deficit (subj loss of light touch, not pain in 4-5th digits bilaterally  of upper extremities) is present. No cranial nerve deficit. Gait normal. GCS eye subscore is 4. GCS verbal subscore is 5. GCS motor subscore is 6.  Skin: Skin is warm and dry.    ED Course  Procedures (including critical care time) Labs Review Labs Reviewed - No data to display  Imaging Review No results found.   EKG Interpretation None      MDM   Final diagnoses:  Bilateral hand numbness    63 y.o. female  with pertinent PMH of DM, peripheral neuropathy presents with bilateral fourth and fifth digit numbness.  Symptoms present on awakening, no other systemic symptoms reported including cough, nausea, vomiting, chest pain, diarrhea. No history of trauma. Patient has a baseline history of peripheral neuropathy and tingling of bilateral palms. She also has a history of degenerative disc disease. She denies neck pain at this time. Patient states that she had one episode of numbness going up into her arm, however repeatedly denies any chest pain or dyspnea.  Likely etiology of pain related to peripheral nerves bilaterally, likely  neuropathy vs radicular pain.  Doubt other emergent pathology, and exam not consistent with acute spinal cord compression, CVA, or other pathology.    Labs and imaging as above reviewed.   1. Bilateral hand numbness         Debby Freiberg, MD 05/14/14 [phone]

## 2014-05-14 NOTE — ED Notes (Addendum)
Pt has numbness in pinky and ring finger in both hands, pt has neuropathy. Pt states she also has swelling in wrists

## 2014-05-18 ENCOUNTER — Encounter (HOSPITAL_COMMUNITY): Payer: Self-pay | Admitting: Emergency Medicine

## 2014-05-18 ENCOUNTER — Telehealth: Payer: Self-pay | Admitting: Internal Medicine

## 2014-05-18 ENCOUNTER — Emergency Department (HOSPITAL_COMMUNITY)
Admission: EM | Admit: 2014-05-18 | Discharge: 2014-05-18 | Disposition: A | Discharge status: Home / Self Care | Payer: Self-pay | Attending: Emergency Medicine | Admitting: Emergency Medicine

## 2014-05-18 DIAGNOSIS — I129 Hypertensive chronic kidney disease with stage 1 through stage 4 chronic kidney disease, or unspecified chronic kidney disease: Secondary | ICD-10-CM | POA: Insufficient documentation

## 2014-05-18 DIAGNOSIS — E039 Hypothyroidism, unspecified: Secondary | ICD-10-CM | POA: Insufficient documentation

## 2014-05-18 DIAGNOSIS — Z79899 Other long term (current) drug therapy: Secondary | ICD-10-CM | POA: Insufficient documentation

## 2014-05-18 DIAGNOSIS — E1142 Type 2 diabetes mellitus with diabetic polyneuropathy: Secondary | ICD-10-CM | POA: Insufficient documentation

## 2014-05-18 DIAGNOSIS — Z791 Long term (current) use of non-steroidal anti-inflammatories (NSAID): Secondary | ICD-10-CM | POA: Insufficient documentation

## 2014-05-18 DIAGNOSIS — N189 Chronic kidney disease, unspecified: Secondary | ICD-10-CM | POA: Insufficient documentation

## 2014-05-18 DIAGNOSIS — Z8719 Personal history of other diseases of the digestive system: Secondary | ICD-10-CM | POA: Insufficient documentation

## 2014-05-18 DIAGNOSIS — E1149 Type 2 diabetes mellitus with other diabetic neurological complication: Secondary | ICD-10-CM | POA: Insufficient documentation

## 2014-05-18 DIAGNOSIS — R209 Unspecified disturbances of skin sensation: Secondary | ICD-10-CM | POA: Insufficient documentation

## 2014-05-18 DIAGNOSIS — Z8701 Personal history of pneumonia (recurrent): Secondary | ICD-10-CM | POA: Insufficient documentation

## 2014-05-18 DIAGNOSIS — E785 Hyperlipidemia, unspecified: Secondary | ICD-10-CM | POA: Insufficient documentation

## 2014-05-18 DIAGNOSIS — Z794 Long term (current) use of insulin: Secondary | ICD-10-CM | POA: Insufficient documentation

## 2014-05-18 DIAGNOSIS — F411 Generalized anxiety disorder: Secondary | ICD-10-CM | POA: Insufficient documentation

## 2014-05-18 LAB — CBG MONITORING, ED: Glucose-Capillary: 79 mg/dL (ref 70–99)

## 2014-05-18 NOTE — Telephone Encounter (Signed)
Patient has walked in today saying that she is experiencing complete numbness in her hands that may be traveling; patient states that she has been calling and has not received a call back; patient says the Sx have been going on for about a week; please f/u with patient by phone or in the lobby to pin point her medical need

## 2014-05-18 NOTE — ED Notes (Signed)
Pt left w/o signing signiture pad. Discharged papers were review with patient.

## 2014-05-18 NOTE — ED Notes (Signed)
Pt states her sugar is low (CBG 79) pt given juice to drink. MD aware and states pt can be discharged.

## 2014-05-18 NOTE — ED Notes (Signed)
Per patient-was seen here Monday and sent home. Went to see PCP today and was sent immediately here for treatment. States "I have been waiting to see a neurologist for a year in [place]." Hx DM. Has neuropathy. C/o numbness and tingling on ring finger and pinky finger on bilateral hands. Reports swelling around wrists. Has full ROM with bilateral hands. Takes Gabapentin. States that the numbness shoots up to left side of neck. Per patient, "The doctor said it's not my heart." Face symmetrical. Ambulatory and moving all extremities equally. Denies chest pain. In NAD. Awaiting MD/PA.

## 2014-05-18 NOTE — Discharge Instructions (Signed)
Follow up with your PCP for Neurology and/or Endocrinology for further evaluation of diabetic nerve pain. Return to ED for further evaluation if you experience fevers, new numbness or weakness, difficulties breathing or speaking.   Emergency Department Resource Guide 1) Find a Doctor and Pay Out of Pocket Although you won't have to find out who is covered by your insurance plan, it is a good idea to ask around and get recommendations. You will then need to call the office and see if the doctor you have chosen will accept you as a new patient and what types of options they offer for patients who are self-pay. Some doctors offer discounts or will set up payment plans for their patients who do not have insurance, but you will need to ask so you aren't surprised when you get to your appointment.  2) Contact Your Local Health Department Not all health departments have doctors that can see patients for sick visits, but many do, so it is worth a call to see if yours does. If you don't know where your local health department is, you can check in your phone book. The CDC also has a tool to help you locate your state's health department, and many state websites also have listings of all of their local health departments.  3) Find a [facility] If your illness is not likely to be very severe or complicated, you may want to try a walk in clinic. These are popping up all over the country in pharmacies, drugstores, and shopping centers. They're usually staffed by nurse practitioners or physician assistants that have been trained to treat common illnesses and complaints. They're usually fairly quick and inexpensive. However, if you have serious medical issues or chronic medical problems, these are probably not your best option.  No Primary Care Doctor: - Call Health Connect at  [phone] - they can help you locate a primary care doctor that  accepts your insurance, provides certain services, etc. - Physician  Referral Service- [phone]  Chronic Pain Problems: Organization         Address  Phone   Notes  [facility]  [phone] Patients need to be referred by their primary care doctor.   Medication Assistance: Organization         Address  Phone   Notes  [facility] [place] [place], [place] [address] [phone] --Must be a resident of [facility] -- Must have NO insurance coverage whatsoever (no Medicaid/ Medicare, etc.) -- The pt. MUST have a primary care doctor that directs their care regularly and follows them in the community   [facility]ist  [phone]   [employer]  [phone]    Agencies that provide inexpensive medical care: Organization         Address  Phone   Notes  [place]  [phone]   [facility]    [phone]   [facility] [place], [place] [address] [phone]   [place] [address] [place] [phone]   Planned Parenthood    [phone]   [facility]    [phone]   [place] and [place] [address], [place] Phone:  [phone], Fax:  [phone] Hours of Operation:  9 am - 6 pm, M-F.  Also accepts Medicaid/Medicare and self-pay.  [facility] for  [facility]  [address], [place] Phone: [phone], Fax: [phone]. Hours of Operation:  8:30 am - 5:30 pm, M-F.  Also accepts Medicaid and self-pay.  [facility] [place] [facility] [address], [place] Phone: [phone]   [place], [place], [place] [phone], Ext. [phone] Mondays & Thursdays: 7-9 AM.  First 15 patients are seen on a first come, first serve basis.    [place] Providers:  Organization         Address  Phone   Notes  [facility] [address], [place] [phone] Also accepts self-pay patients.  [facility] [address], [place]  [phone]   [place], [address], [place] [phone]   [facility] [address] [place] [phone]   Lucianne Lei [address], [place]   [phone] Only accepts [place] Access [place] patients after they have their name applied to their card.   Self-Pay (no insurance) in [facility]:  Organization         Address  Phone   Notes  Sickle Cell Patients, [facility] [place] [phone]   [facility] [place] [phone]   [facility] [place]  [place], [place], [place] [phone]   [facility]/Dr. Osei-Bonsu  [address], [place] or [address], [place] [phone] Phone number for both [place] and [place] locations is the same.  Urgent [facility] and [facility] [address] [place] [phone]   [facility] [address], [place] or [address] [phone]   [facility] [address] [place] [phone], phone; [phone], fax Sees patients 1st and 3rd Saturday of every month.  Must not qualify for public or private insurance (i.e. Medicaid, Medicare, [place] Health Choice, Veterans' Benefits)  Household income should be no more than 200% of the poverty level The clinic cannot treat you if you are pregnant or think you are pregnant  Sexually transmitted diseases are not treated at the clinic.    Dental Care: Organization         Address  Phone  Notes  [facility] [place] [phone] Accepts children up to age 37 who are enrolled  in [place] or [place]; pregnant women with a Medicaid card; and children who have applied for Medicaid or [place] Health Choice, but were declined, whose parents can pay a reduced fee at time of service.  [facility] Department of [facility]  [address], [place] [phone] Accepts children up to age 9 who are enrolled in [place] or [place]; pregnant women with a Medicaid card; and children who have applied for Medicaid or [place] Health Choice, but were declined, whose parents can pay a reduced fee at time of service.  [place] Adult [facility] Access PRO[facility]  [place] [phone] Patients are seen by appointment only. Walk-ins are not accepted. [place] will see patients 62 years of age and older. Monday - Tuesday (8am-5pm) Most Wednesdays (8:30-5pm) $30 per visit, cash only  [place] Adult [facility] Access PRO[facility]  [address], [facility] [phone] Patients are seen by appointment only. Walk-ins are not accepted. [place] will see patients 67 years of age and older. One Wednesday Evening (Monthly: Volunteer Based).  $30 per visit, cash only  [place]  [phone] for adults; Children under age 6, call Graduate Pediatric Dentistry at [phone]. Children aged 3-14, please call [phone] to request a pediatric application.  Dental services are provided in all areas of dental care including fillings, crowns and bridges, complete and partial dentures, implants, gum treatment, root canals, and extractions. Preventive care is also provided. Treatment is provided to both adults and children. Patients are selected via a lottery and there is often a waiting list.   [facility] [address], [place]  [phone] [email or web address]   [facility] [address] [place], [place] [phone], Ext. [phone] Second and Fourth Thursday of each month, opens at  6:30 AM; Clinic ends at 9 AM.  Patients are seen on a first-come first-served basis, and a limited number are seen during each clinic.   [facility]  [address] Hillard Danker [place], [place] [phone]   Eligibility Requirements You must have lived in [place], [place], or [place] counties for at least the last three months.   You cannot be eligible for state or federal sponsored [employer], including [employer], [place], or [employer].   You generally cannot be eligible for healthcare insurance through your employer.    How to apply: Eligibility screenings are held every Tuesday and Wednesday afternoon from 1:00 pm until 4:00 pm. You do not need an appointment for the interview!  [facility] [address] [place], [place]   [place]  [place] Department  [place]  [phone]    Behavioral Health Resources in the Community: Intensive Outpatient Programs Organization         Address  Phone  Notes  [place]. [address], [place], [place] [phone]   [facility] Outpatient [address], [place], [place]   ADS: Alcohol & Drug Svcs [address] [place], [place]   [place] [address]  [place], [place] or [phone]   Substance Abuse Resources Organization         Address  Phone  Notes  Alcohol and Drug Services  [phone]   [place]  [phone]   The [place]   Chinita Pester  [phone]   Residential & Outpatient Substance Abuse Program  [phone]   Psychological Services Organization         Address  Phone  Notes  [facility] [place]  [place]  [phone]   [place] [address] [place]  [phone] or [phone]    Mobile Crisis Teams Organization         Address  Phone  Notes  [facility], Mobile Crisis Care Unit  [phone]   [facility] Psychotherapeutic Services  [address] [place], [place]   [place] Levels [address] [place] [phone]    Self-Help/Support Groups Organization         Address  Phone             Notes  [facility] of [place] - variety of support groups  [employer] Call for more information  Narcotics Anonymous (NA), [facility] [address], [employer] [place]  2 meetings at this location   Special educational needs teacher         Address  Phone  Notes  ASAP [facility]idential [facility] [place],    [place]  [phone]   [facility]  [address] [place] [phone], [place], [place]   [place], [place] [phone] Admissions: 8am-3pm M-F  Incentives Substance [place] [address],    [place], [place] [phone]   [facility] [address] [place], [place], [place]   [facility] [address],  [place], [place]   [facility] - Intensive Outpatient [address] Kristeen [address], [place], [place]   [facility] [employer]  [place], [place] [phone] or [phone]   [facility] (RTS) [address] [place], [place] Accepts Medicaid  [facility] [place] [address],  [place] [phone] Substance Abuse/Addiction Treatment   [facility] Organization         Address  Phone  Notes  [facility]  [phone]   Domenic Schwab, PhD [address] [place], [place]   [phone] or [phone]   [place], [place] [phone]   [facility]  [address], [place], [place] [phone] Insurance/Medicaid/sponsorship through [facility] and Families [address] [place]                                    [place], [place] [phone] [place] [address], [place] [phone]    Dr. Adele Schilder  [phone]   [facility] Dept. 1) [address] [place] 2) [place] 3)  [place] [address], [place] [phone]  [phone]   [place] [phone] or [phone] (After Hours)

## 2014-05-18 NOTE — ED Notes (Signed)
Patient requesting something to eat. Patient says she has been here since 16:30

## 2014-05-18 NOTE — ED Provider Notes (Signed)
CSN: [id number]     Arrival date & time 05/18/14  1647 History   First MD Initiated Contact with Patient 05/18/14 1906     Chief Complaint  Patient presents with  . Numbness     (Consider location/radiation/quality/duration/timing/severity/associated sxs/prior Treatment) HPI Anita James is a 63 y.o. female with a history of chronic back pain and diabetic neuropathy who comes in today for evaluation of numbness and tingling in her fourth and fifth fingers on both hands. She states she was seen here for the same complaint on Monday and the numbness and tingling is not getting better. She states she's been trying to see a neurologist for the past 18 months but she still has to get blue card instead of an orange card. She reports taking gabapentin for her neuropathy. She says sometimes the tingling will shoot up into her neck. She does have a history of spinal stenosis. She is able to ambulate independently. She also reports she has been seen by her primary care for this complaint multiple times. She denies any fevers, dysarthria, loss of bowel or bladder function, hemiparalysis or weakness.  Past Medical History  Diagnosis Date  . Diabetes mellitus type II, [name]   . Frequent headaches   . Chronic back pain   . Chronic kidney disease 03/2013    failure due to meds  . Chest pain   . Obesity    Past Surgical History  Procedure Laterality Date  . Cesarean section    . Knee surgery    . Thyroid surgery     Family History  Problem Relation Age of Onset  . Cancer Mother     cervical- mets   History  Substance Use Topics  . Smoking status: Never Smoker   . Smokeless tobacco: Never Used  . Alcohol Use: No   OB History   Grav Para Term Preterm Abortions TAB SAB Ect Mult Living   4 3 2 1 1   1 2 6      Review of Systems  Constitutional:  Negative for fever.  Eyes: Negative for visual disturbance.  Respiratory: Negative for shortness of breath.   Cardiovascular: Negative for chest pain.  Neurological: Positive for numbness. Negative for facial asymmetry and headaches.  Psychiatric/Behavioral: Negative for confusion.  All other systems reviewed and are negative.     Allergies  Amoxicillin-pot clavulanate and Sulfa antibiotics  Home Medications   Prior to Admission medications   Medication Sig Start Date End Date Taking? Authorizing Provider  butalbital-acetaminophen-caffeine (FIORICET, ESGIC) [phone] MG per tablet Take 1-2 tablets every 6 hours as needed for headache or migraine 02/07/14   Tresa Garter, MD  Dapagliflozin Propanediol (FARXIGA) 10 MG TABS Take 10 mg by mouth daily. 12/05/13   Tresa Garter, MD  diazepam (VALIUM) 5 MG tablet Take 1 tablet (5 mg total) by mouth every 12 (twelve) hours as needed for anxiety. 07/27/13   Robbie Lis, MD  diclofenac sodium (VOLTAREN) 1 % GEL Apply 2 g topically 4 (four) times daily. 01/03/14   Tresa Garter, MD  fluticasone (FLONASE) 50 MCG/ACT nasal spray Place 2 sprays into both nostrils daily. 07/27/13   Robbie Lis, MD  gabapentin (NEURONTIN) 300 MG capsule Take 1 capsule (300 mg total) by mouth 4 (four) times daily. 03/21/14  Tresa Garter, MD  insulin aspart (NOVOLOG) 100 UNIT/ML FlexPen Inject 30 Units into the skin 4 (four) times daily. 05/01/14   Tresa Garter, MD  insulin glargine (LANTUS) 100 UNIT/ML injection Inject 0.6 mLs (60 Units total) into the skin at bedtime. 05/11/14   Tresa Garter, MD  isosorbide mononitrate (IMDUR) 30 MG 24 hr tablet Take 1 tablet (30 mg total) by mouth daily. 07/27/13   Robbie Lis, MD  levothyroxine (SYNTHROID, LEVOTHROID) 100 MCG tablet Take 1 tablet (100 mcg total) by mouth daily. 07/27/13   Robbie Lis, MD  lisinopril (PRINIVIL,ZESTRIL) 40 MG tablet Take 1 tablet (40 mg total) by mouth daily.     Tresa Garter, MD  loratadine (CLARITIN) 10 MG tablet Take 1 tablet (10 mg total) by mouth daily. 06/22/13   [place] Marisa Hua, MD  methocarbamol (ROBAXIN) 500 MG tablet Take 1 tablet (500 mg total) by mouth 4 (four) times daily. 03/09/13   Robbie Lis, MD  metoprolol succinate (TOPROL-XL) 50 MG 24 hr tablet Take 2 tablets (100 mg total) by mouth daily. Take with or immediately following a meal. 03/21/14   Tresa Garter, MD  Multiple Vitamins-Minerals (EYE VITAMINS) CAPS Take 1 capsule by mouth daily. 08/11/12   Thurnell Lose, MD  nitroGLYCERIN (NITROSTAT) 0.4 MG SL tablet Place 0.4 mg under the tongue every 5 (five) minutes as needed for chest pain. 10/06/12   Thurnell Lose, MD  olmesartan (BENICAR) 40 MG tablet Take 1 tablet (40 mg total) by mouth daily. 03/21/14   Tresa Garter, MD  oxyCODONE-acetaminophen (PERCOCET/ROXICET) 5-325 MG per tablet Take 1 tablet by mouth every 8 (eight) hours as needed for pain. 04/15/13   Velvet Bathe, MD  pantoprazole (PROTONIX) 40 MG tablet Take 1 tablet (40 mg total) by mouth 2 (two) times daily. 01/05/14   Tresa Garter, MD  sertraline (ZOLOFT) 50 MG tablet Take 1 tablet (50 mg total) by mouth daily. 09/12/13   Tresa Garter, MD  simvastatin (ZOCOR) 20 MG tablet Take 1 tablet (20 mg total) by mouth at bedtime. 07/27/13   Robbie Lis, MD  traMADol (ULTRAM) 50 MG tablet Take 100 mg by mouth every 6 (six) hours as needed for severe pain.    Historical Provider, MD   BP 159/89  Pulse 81  Temp(Src) 98.1 F (36.7 C) (Oral)  Resp 17  SpO2 96% Physical Exam  Nursing note and vitals reviewed. Constitutional: She appears well-developed and well-nourished. No distress.  HENT:  Head: Normocephalic and atraumatic.  Eyes: Conjunctivae and EOM are normal. Right eye exhibits no discharge. Left eye exhibits no discharge. No scleral icterus.  Neck: Normal range of motion. Neck supple.  Pulmonary/Chest: Effort normal. No respiratory  distress.  Musculoskeletal: Normal range of motion.  Neurological:  No facial asymmetry Motor intact 5/5 in all 4 extremities. Grip strength intact. Deficit to light touch in 4-5 fingers bil. CN II-XII grossly intact AAO x 4  Skin: Skin is warm and dry. No rash noted. She is not diaphoretic.    ED Course  Procedures (including critical care time) Labs Review Labs Reviewed - No data to display  Imaging Review No results found.   EKG Interpretation None     Meds given in ED:  Medications - No data to display  New Prescriptions   No medications on file   Filed Vitals:   05/18/14 1721 05/18/14 1944  BP: 159/89 178/71  Pulse: 81 76  Temp:  98.1 F (36.7 C) 97.9 F (36.6 C)  TempSrc: Oral Oral  Resp: 17 20  SpO2: 96% 96%    MDM  Vitals stable - WNL -afebrile Pt resting comfortably in ED. PE and clinical picture not consistent with CVA or new spinal pathology or any other emergent pathology. Complaint today is chronic in nature, no acute abnormalities or changes today. Reinforced need to f/u with PCP and/or Neurology for further evaluation and management of neuropathy. 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 Discussed f/u with PCP and return precautions, pt very amenable to plan.   Final diagnoses:  None  Prior to patient discharge, I discussed and reviewed this case with Dr.Steinl         Verl Dicker, PA-C 05/18/14 2013  [place], PA-C 05/18/14 2031

## 2014-05-18 NOTE — ED Notes (Signed)
Alger phones prior to her arrival to tell us she is coming here for "numbness of hands".

## 2014-05-19 NOTE — ED Provider Notes (Signed)
Medical screening examination/treatment/procedure(s) were conducted as a shared visit with non-physician practitioner(s) and myself.  I personally evaluated the patient during the encounter.  Pt c/o tingling sensation to ring and small fingers of bilateral hands for past year +.  No recent or acute change. No weakness or loss of normal functional ability. No acute or abrupt change in symptoms. Occasional left neck pain, radiating towards left shoulder, also present x 1-2 years. No recent fall or injury. No headache. Prior mr noted for same symptoms w ddd, foraminal stenosis. Discussed need pcp, spine f/u.    Mirna Mires, MD 05/19/14 1537

## 2014-05-21 ENCOUNTER — Telehealth: Payer: Self-pay | Admitting: *Deleted

## 2014-05-21 NOTE — Telephone Encounter (Signed)
Patient was in ER on 05/18/2014 and was informed at the ED was told that her neuropathy is causing numbness in her hands. Patient was informed she needed to be referred to a neuropathy specialist. Patient wants referral to neuropathy specialist. Please follow up with pt.

## 2014-05-21 NOTE — Telephone Encounter (Signed)
Called returning patient call. No answer left a message for patient to return call.

## 2014-05-22 ENCOUNTER — Other Ambulatory Visit: Payer: Self-pay | Admitting: Internal Medicine

## 2014-05-22 DIAGNOSIS — E1142 Type 2 diabetes mellitus with diabetic polyneuropathy: Secondary | ICD-10-CM

## 2014-05-22 NOTE — Telephone Encounter (Signed)
Patient has been referred to Neurologist

## 2014-05-23 NOTE — Telephone Encounter (Signed)
Called to inform patient that she has been referred to Neuro. No answer, left a voicemail for patient to return our call.

## 2014-05-24 ENCOUNTER — Telehealth: Payer: Self-pay | Admitting: Internal Medicine

## 2014-05-24 NOTE — Telephone Encounter (Signed)
Patient has come in today requesting a cone financial appointment for today; patient was told by neurology that the current orange card that she has is not accepted and that the patient needs to acquire the cone discount in order to schedule an appointment; Facility was contacted and front desk informed me that she will be considered self pay but can still apply for the cone discount; patient verbally agreed to understanding and wanted to proceed with making an appointment;

## 2014-06-04 ENCOUNTER — Telehealth: Payer: Self-pay | Admitting: Internal Medicine

## 2014-06-04 NOTE — Telephone Encounter (Signed)
Patient requests to change PCP because Dr. Christa See comments during last visit. Patient would like to be helped by Dr. Adrian Blackwater. Please follow up with Patient.

## 2014-06-05 ENCOUNTER — Ambulatory Visit: Payer: Self-pay | Attending: Family Medicine | Admitting: Family Medicine

## 2014-06-05 ENCOUNTER — Encounter: Payer: Self-pay | Admitting: Family Medicine

## 2014-06-05 ENCOUNTER — Ambulatory Visit (HOSPITAL_COMMUNITY)
Admission: RE | Admit: 2014-06-05 | Discharge: 2014-06-05 | Disposition: A | Discharge status: Home / Self Care | Payer: Self-pay | Source: Ambulatory Visit | Attending: Family Medicine | Admitting: Family Medicine

## 2014-06-05 VITALS — BP 130/80 | HR 76 | Temp 98.8°F | Resp 18 | Ht 66.0 in | Wt 260.0 lb

## 2014-06-05 DIAGNOSIS — J309 Allergic rhinitis, unspecified: Secondary | ICD-10-CM | POA: Insufficient documentation

## 2014-06-05 DIAGNOSIS — IMO0002 Reserved for concepts with insufficient information to code with codable children: Secondary | ICD-10-CM

## 2014-06-05 DIAGNOSIS — M5412 Radiculopathy, cervical region: Secondary | ICD-10-CM | POA: Insufficient documentation

## 2014-06-05 DIAGNOSIS — R209 Unspecified disturbances of skin sensation: Secondary | ICD-10-CM | POA: Insufficient documentation

## 2014-06-05 DIAGNOSIS — E039 Hypothyroidism, unspecified: Secondary | ICD-10-CM | POA: Insufficient documentation

## 2014-06-05 DIAGNOSIS — Z794 Long term (current) use of insulin: Secondary | ICD-10-CM | POA: Insufficient documentation

## 2014-06-05 DIAGNOSIS — E1165 Type 2 diabetes mellitus with hyperglycemia: Secondary | ICD-10-CM

## 2014-06-05 DIAGNOSIS — E559 Vitamin D deficiency, unspecified: Secondary | ICD-10-CM | POA: Insufficient documentation

## 2014-06-05 DIAGNOSIS — E038 Other specified hypothyroidism: Secondary | ICD-10-CM

## 2014-06-05 DIAGNOSIS — E119 Type 2 diabetes mellitus without complications: Secondary | ICD-10-CM | POA: Insufficient documentation

## 2014-06-05 DIAGNOSIS — IMO0001 Reserved for inherently not codable concepts without codable children: Secondary | ICD-10-CM

## 2014-06-05 HISTORY — DX: Vitamin D deficiency, unspecified: E55.9

## 2014-06-05 LAB — POCT GLYCOSYLATED HEMOGLOBIN (HGB A1C): Hemoglobin A1C: 6

## 2014-06-05 MED ORDER — MOMETASONE FUROATE 50 MCG/ACT NA SUSP: 2.0000 | Freq: Every day | NASAL | Status: DC

## 2014-06-05 MED ORDER — INSULIN ASPART 100 UNIT/ML FLEXPEN: 30.0000 [IU] | PEN_INJECTOR | Freq: Three times a day (TID) | SUBCUTANEOUS | Status: DC

## 2014-06-05 NOTE — Assessment & Plan Note (Signed)
Request nasonex refills called in.

## 2014-06-05 NOTE — Assessment & Plan Note (Signed)
A: chronic P:  Check TSH

## 2014-06-05 NOTE — Assessment & Plan Note (Signed)
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 Patient already had neurology referral in place. Patient already on gabapentin.  P: Cervical x-ray B12 Vit D level  Information and reassurance.

## 2014-06-05 NOTE — Patient Instructions (Addendum)
Anita James,  Thank you for coming in today. It was a pleasure meeting you. I am sorry to hear about your 3 weeks of trouble with your hands. I am concerned about cervical radiculopathy.   You will be called with lab results. Please go for neck x-rays at your earliest convenience.    Your A1c is normal, suggesting very tightly controlled blood sugars. Please decrease novolog to three times daily, I called in the [facility].  Beware of hypoglycemia which is blood sugar less than 70 with or without symptoms.  The common symptoms of hypoglycemia are: sweating, pale or dusty skin, excessive fatigue, nausea, jitteriness. If you experience these symptoms please check your blood sugar.  My blood sugar is low  (less than 70). What should I do?  If low 60- 70, with or without symptoms. Do not take insulin or oral medication,  eat or drink carbohydrates right away (juice, sweets, breads, fruit). Recheck blood sugar in 2 hrs. If still low call your doctor. If normal take medication.   If 60-40 without symptoms.  Same as above and call your doctor.   If 60-40 with symptoms. Same as above. If symptoms resolve within 30 minutes of eating or drinking carbohydrates call your doctor. If symptoms persist call 911.   If less than 40 with or without symptoms. Same as above. Do not wait 30 minutes, instead call 911.      Dr. Adrian Blackwater   Cervical Radiculopathy Cervical radiculopathy happens when a nerve in the neck is pinched or bruised by a slipped (herniated) disk or by arthritic changes in the bones of the cervical spine. This can occur due to an injury or as part of the normal aging process. Pressure on the cervical nerves can cause pain or numbness that runs from your neck all the way down into your arm and fingers. CAUSES  There are many possible causes, including:  Injury.  Muscle tightness in the neck from overuse.  Swollen, painful joints (arthritis).  Breakdown  or degeneration in the bones and joints of the spine (spondylosis) due to aging.  Bone spurs that may develop near the cervical nerves. SYMPTOMS  Symptoms include pain, weakness, or numbness in the affected arm and hand. Pain can be severe or irritating. Symptoms may be worse when extending or turning the neck. DIAGNOSIS  Your caregiver will ask about your symptoms and do a physical exam. He or she may test your strength and reflexes. X-rays, CT scans, and MRI scans may be needed in cases of injury or if the symptoms do not go away after a period of time. Electromyography (EMG) or nerve conduction testing may be done to study how your nerves and muscles are working. TREATMENT  Your caregiver may recommend certain exercises to help relieve your symptoms. Cervical radiculopathy can, and often does, get better with time and treatment. If your problems continue, treatment options may include:  Wearing a soft collar for short periods of time.  Physical therapy to strengthen the neck muscles.  Medicines, such as nonsteroidal anti-inflammatory drugs (NSAIDs), oral corticosteroids, or spinal injections.  Surgery. Different types of surgery may be done depending on the cause of your problems. HOME CARE INSTRUCTIONS   Put ice on the affected area.  Put ice in a plastic bag.  Place a towel between your skin and the bag.  Leave the ice on for 15-20 minutes, 03-04 times a day or as directed by your caregiver.  If ice does  not help, you can try using heat. Take a warm shower or bath, or use a hot water bottle as directed by your caregiver.  You may try a gentle neck and shoulder massage.  Use a flat pillow when you sleep.  Only take over-the-counter or prescription medicines for pain, discomfort, or fever as directed by your caregiver.  If physical therapy was prescribed, follow your caregiver's directions.  If a soft collar was prescribed, use it as directed. SEEK IMMEDIATE MEDICAL CARE IF:     Your pain gets much worse and cannot be controlled with medicines.  You have weakness or numbness in your hand, arm, face, or leg.  You have a high fever or a stiff, rigid neck.  You lose bowel or bladder control (incontinence).  You have trouble with walking, balance, or speaking. MAKE SURE YOU:   Understand these instructions.  Will watch your condition.  Will get help right away if you are not doing well or get worse. Document Released: 06/02/2001 Document Revised: 11/30/2011 Document Reviewed: 04/21/2011 [facility] Patient Information 2015 [place], [place]. This information is not intended to replace advice given to you by your health care provider. Make sure you discuss any questions you have with your health care provider.

## 2014-06-05 NOTE — Progress Notes (Signed)
Complaining numbness on both hand, worst at night Pain shooting up arm and neck

## 2014-06-05 NOTE — Assessment & Plan Note (Addendum)
A: well controlled DM2. Normal A1c.  Meds: complaint P: Decrease novolog, patient prefers not to stop completely, decrease to TID Continue lantus.

## 2014-06-05 NOTE — Progress Notes (Signed)
   Subjective:    [name], female    DOB: 1951-08-27, 63 y.o.   MRN: [id number] CC: discuss tingling and numbness in hands, PCP change from Dr. Doreene Burke to myself.  HPI 63 year old female with history of diabetes, hypothyroidism and depression presents for followup appointment discussed the following:  #1 tingling and numbness in hands: 3 weeks ago patient woke up to tingling and numbness in both hands along the fourth and fifth digit. Since then symptoms have been progressively worse. She reports worsening numbness. She also reports decrease grip strength in those fingers. She also has daily sensations that radiate down from her neck to the medial arm into her hands. She did have a fall a few days ago she thought she was gripping onto a banister and was not. 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 She has a history of osteoporosis previously on Boniva and vitamin D supplementation.  Soc Hx: chronic non smoker  Review of Systems As per HPI     Objective:   Physical Exam BP 130/80  Pulse 76  Temp(Src) 98.8 F (37.1 C) (Oral)  Resp 18  Ht 5\' 6"  (1.676 m)  Wt 260 lb (117.935 kg)  BMI 41.99 kg/m2  SpO2 96% Wt Readings from Last 3 Encounters:  06/05/14 260 lb (117.935 kg)  05/14/14 260 lb (117.935 kg)  12/23/13 260 lb (117.935 kg)  General appearance: alert, cooperative and no distress Neck: Positive  spurling's on the L  Full neck range of motion Grip strength and sensation decreased in both hands R>L especially 4th and 5th digit.  Strength decreased in C8-T1 distribution, otherwise normal.  Decreased sensation to light touch and monofilament in C8 distribution on both hands.   Lab Results  Component Value Date   HGBA1C 6.0 06/05/2014       Assessment & Plan:

## 2014-06-05 NOTE — Telephone Encounter (Signed)
Nasonex [facility] [phone]

## 2014-06-06 LAB — VITAMIN D 25 HYDROXY (VIT D DEFICIENCY, FRACTURES): Vit D, 25-Hydroxy: 34 ng/mL (ref 30–89)

## 2014-06-06 LAB — TSH: TSH: 1.156 u[IU]/mL (ref 0.350–4.500)

## 2014-06-06 LAB — VITAMIN B12: Vitamin B-12: 635 pg/mL (ref 211–911)

## 2014-06-07 ENCOUNTER — Telehealth: Payer: Self-pay | Admitting: Family Medicine

## 2014-06-07 MED ORDER — LEVOTHYROXINE SODIUM 100 MCG PO TABS: 100.0000 ug | ORAL_TABLET | Freq: Every day | ORAL | Status: DC

## 2014-06-07 NOTE — Telephone Encounter (Signed)
Phoned in synthroid refill to [facility] pharmacy

## 2014-06-08 ENCOUNTER — Ambulatory Visit (INDEPENDENT_AMBULATORY_CARE_PROVIDER_SITE_OTHER): Payer: Self-pay | Admitting: Neurology

## 2014-06-08 ENCOUNTER — Encounter: Payer: Self-pay | Admitting: Neurology

## 2014-06-08 VITALS — BP 152/86 | HR 82 | Ht 66.5 in | Wt 260.0 lb

## 2014-06-08 DIAGNOSIS — R209 Unspecified disturbances of skin sensation: Secondary | ICD-10-CM | POA: Insufficient documentation

## 2014-06-08 NOTE — Patient Instructions (Signed)

## 2014-06-08 NOTE — Progress Notes (Signed)
Reason for visit: Hand numbness  Anita James is a 63 y.o. female  History of present illness:  Anita James is a 63 year old right-handed white female with a history of obesity and diabetes. The patient has a 3 week history of onset of numbness and tingling sensations in the hands bilaterally, affecting the ulnar nerve distribution. The patient has also reported some discomfort in the neck and shoulders, and she has some tight sensations in the wrists. The patient has had some chest discomfort associated with these symptoms, and she went to the emergency room for an evaluation. The patient is unclear whether there is any true weakness of the upper extremities. She denies any numbness or weakness in the lower extremities. She denies any significant gait instability problems or difficulty controlling the bowels or the bladder. She indicates that she turns her head to the left, she can induce some discomfort down into the left arm. The patient has been seen through her primary care physician, and she was referred to this office for an evaluation of these new symptoms.  Past Medical History  Diagnosis Date  . Diabetes mellitus type II, [name]   . Frequent headaches   . Chronic back pain   . Chronic kidney disease 03/2013    failure due to meds  . Chest pain   . Obesity     Past Surgical History  Procedure Laterality Date  . Cesarean section    . Knee surgery Left   . Thyroid surgery      goiter    Family History  Problem Relation Age of Onset  . Cancer Mother     cervical- mets  . Cancer Father   . Heart disease Father   . Diabetes Father   . Diabetes Sister   . Heart disease Sister   . Heart disease Sister   . COPD Sister     Social history:  reports that she has never smoked. She has never used smokeless tobacco. She reports that she  does not drink alcohol or use illicit drugs.  Medications:  Current Outpatient Prescriptions on File Prior to Visit  Medication Sig Dispense Refill  . butalbital-acetaminophen-caffeine (FIORICET, ESGIC) 50-325-40 MG per tablet Take 1-2 tablets every 6 hours as needed for headache or migraine  30 tablet  0  . diazepam (VALIUM) 5 MG tablet Take 1 tablet (5 mg total) by mouth every 12 (twelve) hours as needed for anxiety.  60 tablet  1  . gabapentin (NEURONTIN) 300 MG capsule Take 1 capsule (300 mg total) by mouth 4 (four) times daily.  120 capsule  5  . insulin aspart (NOVOLOG) 100 UNIT/ML FlexPen Inject 30 Units into the skin 3 (three) times daily with meals.  15 mL  3  . insulin glargine (LANTUS) 100 UNIT/ML injection Inject 0.6 mLs (60 Units total) into the skin at bedtime.  6 vial  3  . levothyroxine (SYNTHROID, LEVOTHROID) 100 MCG tablet Take 1 tablet (100 mcg total) by mouth daily.  30 tablet  5  . lisinopril (PRINIVIL,ZESTRIL) 40 MG tablet Take 1 tablet (40 mg total) by mouth daily.  90 tablet  0  . loratadine (CLARITIN) 10 MG tablet Take 1 tablet (10 mg total) by mouth daily.  30 tablet  3  . methocarbamol (ROBAXIN) 500 MG  tablet Take 1 tablet (500 mg total) by mouth 4 (four) times daily.  30 tablet  2  . metoprolol succinate (TOPROL-XL) 50 MG 24 hr tablet Take 2 tablets (100 mg total) by mouth daily. Take with or immediately following a meal.  90 tablet  3  . mometasone (NASONEX) 50 MCG/ACT nasal spray Place 2 sprays into the nose daily.  17 g  5  . Multiple Vitamins-Minerals (EYE VITAMINS) CAPS Take 1 capsule by mouth daily.      . nitroGLYCERIN (NITROSTAT) 0.4 MG SL tablet Place 0.4 mg under the tongue every 5 (five) minutes as needed for chest pain.      . pantoprazole (PROTONIX) 40 MG tablet Take 1 tablet (40 mg total) by mouth 2 (two) times daily.  90 tablet  3  . sertraline (ZOLOFT) 50 MG tablet Take 50 mg by mouth 3 (three) times daily as needed.       . simvastatin (ZOCOR) 20 MG  tablet Take 1 tablet (20 mg total) by mouth at bedtime.  30 tablet  5  . traMADol (ULTRAM) 50 MG tablet Take 100 mg by mouth every 6 (six) hours as needed for severe pain.      . isosorbide mononitrate (IMDUR) 30 MG 24 hr tablet Take 1 tablet (30 mg total) by mouth daily.  30 tablet  6  . olmesartan (BENICAR) 40 MG tablet Take 1 tablet (40 mg total) by mouth daily.  90 tablet  3  . oxyCODONE-acetaminophen (PERCOCET/ROXICET) 5-325 MG per tablet Take 1 tablet by mouth every 8 (eight) hours as needed for pain.  15 tablet  0  . [DISCONTINUED] fenofibrate (TRICOR) 145 MG tablet Take 1 tablet (145 mg total) by mouth daily.  30 tablet  2  . [DISCONTINUED] insulin detemir (LEVEMIR) 100 UNIT/ML injection Inject 60 Units into the skin at bedtime.  10 mL  1  . [DISCONTINUED] metoprolol (LOPRESSOR) 50 MG tablet Take 1 tablet (50 mg total) by mouth 2 (two) times daily.  60 tablet  2   No current facility-administered medications on file prior to visit.      Allergies  Allergen Reactions  . Amoxicillin-Pot Clavulanate Nausea And Vomiting and Other (See Comments)    Combination of medications tear lining of stomach  . Sulfa Antibiotics Shortness Of Breath    ROS:  Out of a complete 14 system review of symptoms, the patient complains only of the following symptoms, and all other reviewed systems are negative.  Weight gain, fatigue Chest pain, swelling in the legs Birthmark, itching Shortness of breath Joint pain, joint swelling, achy muscles Confusion, headache, numbness, weakness Depression, anxiety, decreased energy Sleepiness  Blood pressure 152/86, pulse 82, height 5' 6.5" (1.689 m), weight 260 lb (117.935 kg).  Physical Exam  General: The patient is alert and cooperative at the time of the examination. The patient is markedly obese.  Eyes: Pupils are equal, round, and reactive to light. Discs are flat bilaterally.  Neck: The neck is supple, no carotid bruits are noted.  Respiratory:  The respiratory examination is clear.  Cardiovascular: The cardiovascular examination reveals a regular rate and rhythm, no obvious murmurs or rubs are noted.  Skin: Extremities are with trace edema of ankles bilaterally.  Neurologic Exam  Mental status: The patient is alert and oriented x 3 at the time of the examination. The patient has apparent normal recent and remote memory, with an apparently normal attention span and concentration ability.  Cranial nerves: Facial symmetry is present. There  is good sensation of the face to pinprick and soft touch bilaterally. The strength of the facial muscles and the muscles to head turning and shoulder shrug are normal bilaterally. Speech is well enunciated, no aphasia or dysarthria is noted. Extraocular movements are full. Visual fields are full. The tongue is midline, and the patient has symmetric elevation of the soft palate. No obvious hearing deficits are noted.  Motor: The motor testing reveals 5 over 5 strength of all 4 extremities. Good symmetric motor tone is noted throughout.  Sensory: Sensory testing is intact to pinprick, soft touch, vibration sensation, and position sense on all 4 extremities, with exception that there is a stocking pattern pinprick sensory deficit in the distal third of the legs bilaterally. No evidence of extinction is noted.  Coordination: Cerebellar testing reveals good finger-nose-finger and heel-to-shin bilaterally.  Gait and station: Gait is normal. Tandem gait is unsteady. Romberg is negative. No drift is seen.  Reflexes: Deep tendon reflexes are symmetric, but are depressed bilaterally. Toes are downgoing bilaterally.   Assessment/Plan:  One. Cervical spondylosis  2. Bilateral upper extremity numbness, discomfort  3. History of diabetes  The patient has reported new symptoms of numbness and discomfort into both arms. The patient also has some neck discomfort that has been chronic in nature, but this has  increased some with the new symptoms in the arms. The patient will be set up for an evaluation of possible bilateral ulnar neuropathies versus cervical radiculopathies. The patient will undergo nerve conduction studies of both arms and one leg, and she will have EMG evaluation studies of both legs. She will return for the EMG evaluation.  Jill Alexanders MD 06/09/2014 9:37 AM  [facility] [address] [place], [place] [address]  Phone [phone] Fax [phone]

## 2014-06-11 ENCOUNTER — Telehealth: Payer: Self-pay | Admitting: *Deleted

## 2014-06-11 NOTE — Telephone Encounter (Signed)
Message copied by Betti Cruz on Mon Jun 11, 2014  2:43 PM ------      Message from: Boykin Nearing      Created: Thu Jun 07, 2014  1:52 PM       Normal B12, vitamin D, TSH.      Synthroid refill called in at same dose. ------

## 2014-06-11 NOTE — Telephone Encounter (Signed)
Left voice massage to return call 

## 2014-06-11 NOTE — Telephone Encounter (Signed)
Message copied by Betti Cruz on Mon Jun 11, 2014  2:44 PM ------      Message from: Boykin Nearing      Created: Thu Jun 07, 2014  1:52 PM       Normal B12, vitamin D, TSH.      Synthroid refill called in at same dose. ------

## 2014-06-13 ENCOUNTER — Telehealth: Payer: Self-pay | Admitting: Family Medicine

## 2014-06-13 ENCOUNTER — Telehealth: Payer: Self-pay | Admitting: Emergency Medicine

## 2014-06-13 NOTE — Telephone Encounter (Signed)
Pt call requesting lab results and med refill for levothyroxine (SYNTHROID, LEVOTHROID) 100 MCG tablet [[id number]]. Pt informed that her refill request was sent to her pharmacy ([facility] on 06/07/14. Pt states she contacted pharmacy this morning and they didn't have. Please follow up with pt.

## 2014-06-13 NOTE — Telephone Encounter (Signed)
Left message on VM of pt xray results  Pt also informed prescription Levothyroxine was called in [place]

## 2014-06-14 ENCOUNTER — Encounter (INDEPENDENT_AMBULATORY_CARE_PROVIDER_SITE_OTHER): Payer: Self-pay

## 2014-06-14 ENCOUNTER — Ambulatory Visit (INDEPENDENT_AMBULATORY_CARE_PROVIDER_SITE_OTHER): Payer: Self-pay | Admitting: Neurology

## 2014-06-14 ENCOUNTER — Encounter: Payer: Self-pay | Admitting: *Deleted

## 2014-06-14 ENCOUNTER — Telehealth: Payer: Self-pay | Admitting: Family Medicine

## 2014-06-14 ENCOUNTER — Encounter: Payer: Self-pay | Admitting: Neurology

## 2014-06-14 DIAGNOSIS — G5620 Lesion of ulnar nerve, unspecified upper limb: Secondary | ICD-10-CM

## 2014-06-14 DIAGNOSIS — Z0289 Encounter for other administrative examinations: Secondary | ICD-10-CM

## 2014-06-14 DIAGNOSIS — G5600 Carpal tunnel syndrome, unspecified upper limb: Secondary | ICD-10-CM | POA: Insufficient documentation

## 2014-06-14 DIAGNOSIS — G5623 Lesion of ulnar nerve, bilateral upper limbs: Secondary | ICD-10-CM

## 2014-06-14 DIAGNOSIS — R209 Unspecified disturbances of skin sensation: Secondary | ICD-10-CM

## 2014-06-14 DIAGNOSIS — Z599 Problem related to housing and economic circumstances, unspecified: Secondary | ICD-10-CM

## 2014-06-14 DIAGNOSIS — G5603 Carpal tunnel syndrome, bilateral upper limbs: Secondary | ICD-10-CM

## 2014-06-14 HISTORY — DX: Lesion of ulnar nerve, unspecified upper limb: G56.20

## 2014-06-14 HISTORY — DX: Carpal tunnel syndrome, unspecified upper limb: G56.00

## 2014-06-14 NOTE — Telephone Encounter (Signed)
Pt. Walked in asking for blood results performed on last office visit....Marland KitchenMarland Kitchenplease f/u with patient....pt. Would like to know if there is any medication she can be prescribed for her  Carpal tunnel syndrome.Marland KitchenMarland Kitchen

## 2014-06-14 NOTE — Procedures (Signed)
HISTORY:  Anita James is a 63 year old patient with a history of numbness in the ulnar aspects of both upper extremities. The patient also reports some neck discomfort, left greater than right. She is being evaluated for possible cervical radiculopathy versus a neuropathy affecting the upper extremities.  NERVE CONDUCTION STUDIES:  Nerve conduction studies were performed on both upper extremities. The distal motor latencies for the median nerves were prolonged bilaterally, with a low motor amplitude on the left, normal on the right. The distal motor latencies for the ulnar nerves were at the upper limits of normal on the right, normal on the left, with normal motor amplitudes for the ulnar nerves bilaterally. The F wave latencies for the median nerves were normal on the right, prolonged on the left, and prolonged for the ulnar nerves bilaterally. The nerve conduction velocities for the median and ulnar nerves were normal bilaterally. The sensory latencies for the median nerves were prolonged bilaterally, and minimally prolonged for the ulnar nerves bilaterally.  Nerve conduction studies were performed on the right lower extremity. The distal motor latencies and motor amplitudes for the peroneal and posterior tibial nerves were within normal limits. The nerve conduction velocities for these nerves were also normal. The sensory latency for the peroneal nerve was slightly prolonged.  EMG evaluation:  EMG study was performed on the right upper extremity:  The first dorsal interosseous muscle reveals 2 to 5 K units with decreased recruitment. No fibrillations or positive waves were noted. The abductor pollicis brevis muscle reveals 2 to 4 K units with decreased recruitment. One plus fibrillations and positive waves were noted. The extensor indicis proprius muscle reveals 1 to 3 K units with full recruitment. No fibrillations or positive waves were noted. The pronator teres muscle reveals 2 to 3 K  units with full recruitment. No fibrillations or positive waves were noted. The flexor digitorum profundus muscle (III-IV) reveals 2 to 3 K units with full recruitment. No fibrillations or positive waves were seen. The biceps muscle reveals 1 to 2 K units with full recruitment. No fibrillations or positive waves were noted. The triceps muscle reveals 2 to 4 K units with full recruitment. No fibrillations or positive waves were noted. The anterior deltoid muscle reveals 2 to 3 K units with full recruitment. No fibrillations or positive waves were noted. The cervical paraspinal muscles were tested at 2 levels. No abnormalities of insertional activity were seen at either level tested. There was fair relaxation.  EMG study was performed on the left upper extremity:  The first dorsal interosseous muscle reveals 2 to 5 K units with decreased recruitment. No fibrillations or positive waves were noted. The abductor pollicis brevis muscle reveals 2 to 5 K units with decreased recruitment. No fibrillations or positive waves were noted. The extensor indicis proprius muscle reveals 1 to 3 K units with full recruitment. No fibrillations or positive waves were noted. The pronator teres muscle reveals 2 to 3 K units with full recruitment. No fibrillations or positive waves were noted. The flexor digitorum profundus muscle (III-IV) reveals 2 to 3 K units with full recruitment. No fibrillations or positive waves were seen. The biceps muscle reveals 1 to 2 K units with full recruitment. No fibrillations or positive waves were noted. The triceps muscle reveals 2 to 4 K units with full recruitment. No fibrillations or positive waves were noted. The anterior deltoid muscle reveals 2 to 3 K units with full recruitment. No fibrillations or positive waves were noted. The  cervical paraspinal muscles were tested at 2 levels. No abnormalities of insertional activity were seen at either level tested. There was good  relaxation.   IMPRESSION:  Nerve conduction studies done on both upper extremities shows evidence of bilateral carpal tunnel syndrome of mild severity on the right, and moderate severity on the left. There are some diffuse sensory latency prolongation on the studies of the arms and legs, and a mild underlying peripheral neuropathy cannot be excluded. EMG evaluation of the right upper extremity shows findings consistent with the carpal tunnel syndrome, but there is evidence of a mild acute ulnar neuropathy as well, likely distal in nature. EMG of the left upper extremity shows findings consistent with a chronic carpal tunnel syndrome, and a mild chronic distal ulnar neuropathy as well.   Jill Alexanders MD 06/14/2014 4:45 PM  [facility] [address] [place], [place] [address]  Phone [phone] Fax [phone]

## 2014-06-14 NOTE — Progress Notes (Signed)
LCSW met with patient who stated that she needed wrist stints and an elbow brace.  LCSW contacted multiple resources and finally was able to identify low pricing for the items she needed.  LCSW provided patient with the contact information.  Patient will follow with LCSW as needed.  Christene Lye MSW, LCSW

## 2014-06-14 NOTE — Progress Notes (Signed)
Anita James is a 63 year old patient with a history of numbness and ulnar aspects of the hands bilaterally and some neck discomfort, left greater than right. The patient returns for EMG and nerve conduction study.  Nerve conduction studies showed evidence of bilateral carpal tunnel syndrome and mild distal bilateral ulnar neuropathies.  The patient will wear wrist splint for the next 2-3 months, she will followup at that time. Prescription was given for the wrist splint.

## 2014-07-12 ENCOUNTER — Telehealth: Payer: Self-pay | Admitting: Family Medicine

## 2014-07-12 NOTE — Telephone Encounter (Signed)
Patient is concerned about her blood test made last month. She would appreciate if a Nurse would let her know. Please follow up with Patient

## 2014-07-13 ENCOUNTER — Telehealth: Payer: Self-pay | Admitting: Emergency Medicine

## 2014-07-13 NOTE — Telephone Encounter (Signed)
Left message of normal blood work

## 2014-07-23 ENCOUNTER — Encounter: Payer: Self-pay | Admitting: Neurology

## 2014-08-08 ENCOUNTER — Encounter: Payer: Self-pay | Admitting: Neurology

## 2014-08-09 ENCOUNTER — Other Ambulatory Visit: Payer: Self-pay | Admitting: Emergency Medicine

## 2014-08-09 DIAGNOSIS — E119 Type 2 diabetes mellitus without complications: Secondary | ICD-10-CM

## 2014-08-09 MED ORDER — INSULIN GLARGINE 100 UNIT/ML ~~LOC~~ SOLN: 60.0000 [IU] | Freq: Every day | SUBCUTANEOUS | Status: DC

## 2014-08-09 MED ORDER — PANTOPRAZOLE SODIUM 40 MG PO TBEC
40.0000 mg | DELAYED_RELEASE_TABLET | Freq: Two times a day (BID) | ORAL | Status: DC
Start: 2014-08-09 — End: 2015-03-19

## 2014-08-10 ENCOUNTER — Emergency Department (HOSPITAL_COMMUNITY)
Admission: EM | Admit: 2014-08-10 | Discharge: 2014-08-10 | Disposition: A | Discharge status: Home / Self Care | Payer: No Typology Code available for payment source | Source: Home / Self Care | Attending: Family Medicine | Admitting: Family Medicine

## 2014-08-10 ENCOUNTER — Telehealth: Payer: Self-pay | Admitting: Family Medicine

## 2014-08-10 ENCOUNTER — Encounter (HOSPITAL_COMMUNITY): Payer: Self-pay | Admitting: Emergency Medicine

## 2014-08-10 DIAGNOSIS — J018 Other acute sinusitis: Secondary | ICD-10-CM

## 2014-08-10 MED ORDER — CEPHALEXIN 500 MG PO CAPS: 1000.0000 mg | ORAL_CAPSULE | Freq: Three times a day (TID) | ORAL | Status: DC

## 2014-08-10 NOTE — Telephone Encounter (Signed)
Pt. Came into facility stating that she has a sore throat and sinus pressure, pt. Declined to come back in during walk-in hours to see a triage nurse. Please f/u with pt.

## 2014-08-10 NOTE — Discharge Instructions (Signed)
Sinusitis °Sinusitis is redness, soreness, and puffiness (inflammation) of the air pockets in the bones of your face (sinuses). The redness, soreness, and puffiness can cause air and mucus to get trapped in your sinuses. This can allow germs to grow and cause an infection.  °HOME CARE  °· Drink enough fluids to keep your pee (urine) clear or pale yellow. °· Use a humidifier in your home. °· Run a hot shower to create steam in the bathroom. Sit in the bathroom with the door closed. Breathe in the steam 3-4 times a day. °· Put a warm, moist washcloth on your face 3-4 times a day, or as told by your doctor. °· Use salt water sprays (saline sprays) to wet the thick fluid in your nose. This can help the sinuses drain. °· Only take medicine as told by your doctor. °GET HELP RIGHT AWAY IF:  °· Your pain gets worse. °· You have very bad headaches. °· You are sick to your stomach (nauseous). °· You throw up (vomit). °· You are very sleepy (drowsy) all the time. °· Your face is puffy (swollen). °· Your vision changes. °· You have a stiff neck. °· You have trouble breathing. °MAKE SURE YOU:  °· Understand these instructions. °· Will watch your condition. °· Will get help right away if you are not doing well or get worse. °Document Released: 02/24/2008 Document Revised: 06/01/2012 Document Reviewed: 04/12/2012 °[employer]® Patient Information ©2015 [employer]. This information is not intended to replace advice given to you by your health care provider. Make sure you discuss any questions you have with your health care provider. ° °

## 2014-08-10 NOTE — ED Provider Notes (Signed)
CSN: [id number]     Arrival date & time 08/10/14  1315 History   First MD Initiated Contact with Patient 08/10/14 1417     Chief Complaint  Patient presents with  . Recurrent Sinusitis  . Sore Throat   (Consider location/radiation/quality/duration/timing/severity/associated sxs/prior Treatment) HPI             63 year old female presents complaining of a sinus infection. Since Wednesday she has sinus pressure in her frontal and maxillary sinuses, posterior nasal drainage, body aches, subjective fevers. She also has a sore throat and a cough. She has tried taking over-the-counter medications without relief. She has also been using her nasal nasal spray without relief. She has a history of diabetes, she says she gets sinus infections very easily if she gets sick.    Past Medical History  Diagnosis Date  . Diabetes mellitus type II, [name]   . Frequent headaches   . Chronic back pain   . Chronic kidney disease 03/2013    failure due to meds  . Chest pain   . Obesity   . Carpal tunnel syndrome 06/14/2014  . 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 Cesarean section    . Knee surgery Left   . Thyroid surgery      goiter   Family History  Problem Relation Age of Onset  . Cancer Mother     cervical- mets  . Cancer Father   . Heart disease Father   . Diabetes Father   . Diabetes Sister   . Heart disease Sister   . Heart disease Sister   . COPD Sister    History  Substance Use Topics  . Smoking status: Never Smoker   . Smokeless tobacco: Never Used  . Alcohol Use: No   OB History    Gravida Para Term Preterm AB TAB SAB Ectopic Multiple Living   4 3 2 1 1   1 2 6      Review of Systems  Constitutional: Positive for fever, chills and fatigue.  HENT: Positive for congestion, rhinorrhea,  sinus pressure and sore throat.   Musculoskeletal: Positive for myalgias.  All other systems reviewed and are negative.   Allergies  Amoxicillin-pot clavulanate and Sulfa antibiotics  Home Medications   Prior to Admission medications   Medication Sig Start Date End Date Taking? Authorizing Provider  butalbital-acetaminophen-caffeine (FIORICET, ESGIC) [phone] MG per tablet Take 1-2 tablets every 6 hours as needed for headache or migraine 02/07/14  Yes Olugbemiga E Doreene Burke, MD  diazepam (VALIUM) 5 MG tablet Take 1 tablet (5 mg total) by mouth every 12 (twelve) hours as needed for anxiety. 07/27/13  Yes Robbie Lis, MD  gabapentin (NEURONTIN) 300 MG capsule Take 1 capsule (300 mg total) by mouth 4 (four) times daily. 03/21/14  Yes Olugbemiga Essie Christine, MD  insulin aspart (NOVOLOG) 100 UNIT/ML FlexPen Inject 30 Units into the skin 3 (three) times daily with meals. 06/05/14  Yes Josalyn C Funches, MD  insulin glargine (LANTUS) 100 UNIT/ML injection Inject 0.6 mLs (60 Units total) into the skin at bedtime. 08/09/14  Yes Josalyn C Funches, MD  isosorbide mononitrate (IMDUR) 30 MG 24 hr tablet Take 1 tablet (30 mg total) by mouth daily. 07/27/13  Yes Robbie Lis, MD  levothyroxine (SYNTHROID, LEVOTHROID) 100 MCG tablet Take 1 tablet (100 mcg total) by mouth daily. 06/07/14  Yes Josalyn C Funches, MD  lisinopril (PRINIVIL,ZESTRIL) 40 MG tablet Take 1 tablet (40 mg total) by mouth daily.   Yes Tresa Garter, MD  loratadine (CLARITIN) 10 MG tablet Take 1 tablet (10 mg total) by mouth daily. 06/22/13  Yes Clanford Marisa Hua, MD  methocarbamol (ROBAXIN) 500 MG tablet Take 1 tablet (500 mg total) by mouth 4 (four) times daily. 03/09/13  Yes Robbie Lis, MD  metoprolol succinate (TOPROL-XL) 50 MG 24 hr tablet Take 2 tablets (100 mg total) by mouth daily. Take with or immediately following a meal. 03/21/14  Yes Olugbemiga E Jegede, MD  mometasone (NASONEX) 50 MCG/ACT nasal spray Place 2 sprays into the nose  daily. 06/05/14  Yes Josalyn C Funches, MD  Multiple Vitamins-Minerals (EYE VITAMINS) CAPS Take 1 capsule by mouth daily. 08/11/12  Yes Thurnell Lose, MD  nitroGLYCERIN (NITROSTAT) 0.4 MG SL tablet Place 0.4 mg under the tongue every 5 (five) minutes as needed for chest pain. 10/06/12  Yes Thurnell Lose, MD  olmesartan (BENICAR) 40 MG tablet Take 1 tablet (40 mg total) by mouth daily. 03/21/14  Yes Tresa Garter, MD  oxyCODONE-acetaminophen (PERCOCET/ROXICET) 5-325 MG per tablet Take 1 tablet by mouth every 8 (eight) hours as needed for pain. 04/15/13  Yes Velvet Bathe, MD  pantoprazole (PROTONIX) 40 MG tablet Take 1 tablet (40 mg total) by mouth 2 (two) times daily. 08/09/14  Yes Josalyn C Funches, MD  sertraline (ZOLOFT) 50 MG tablet Take 50 mg by mouth 3 (three) times daily as needed.    Yes Historical Provider, MD  simvastatin (ZOCOR) 20 MG tablet Take 1 tablet (20 mg total) by mouth at bedtime. 07/27/13  Yes Robbie Lis, MD  traMADol (ULTRAM) 50 MG tablet Take 100 mg by mouth every 6 (six) hours as needed for severe pain.   Yes Historical Provider, MD  cephALEXin (KEFLEX) 500 MG capsule Take 2 capsules (1,000 mg total) by mouth 3 (three) times daily. 08/10/14   Freeman Caldron Erasmo Vertz, PA-C   BP 157/85 mmHg  Pulse 79  Temp(Src) 97.7 F (36.5 C) (Oral)  Resp 18  SpO2 96% Physical Exam  Constitutional: She is oriented to person, place, and time. Vital signs are normal. She appears well-developed and well-nourished. No distress.  HENT:  Head: Normocephalic and atraumatic.  Right Ear: Tympanic membrane, external ear and ear canal normal.  Left Ear: Tympanic membrane, external ear and ear canal normal.  Nose: Right sinus exhibits maxillary sinus tenderness and frontal sinus tenderness. Left sinus exhibits maxillary sinus tenderness and frontal sinus tenderness.  Mouth/Throat: Oropharynx is clear and moist. No oropharyngeal exudate.  Morbidly obese habitus  Cardiovascular: Normal rate,  regular rhythm and normal heart sounds.   Pulmonary/Chest: Effort normal and breath sounds normal. No respiratory distress.  Neurological: She is alert and oriented to person, place, and time. She has normal strength. Coordination normal.  Skin: Skin is warm and dry. No rash noted. She is not diaphoretic.  Psychiatric: She has a normal mood and affect. Judgment normal.  Nursing note and vitals reviewed.   ED Course  Procedures (including critical care time) Labs Review Labs Reviewed - No data to display  Imaging Review No results found.   MDM   1. Other acute sinusitis    Treat with Keflex, continue nasal spray. Follow-up when necessary   Meds ordered this encounter  Medications  .  cephALEXin (KEFLEX) 500 MG capsule    Sig: Take 2 capsules (1,000 mg total) by mouth 3 (three) times daily.    Dispense:  42 capsule    Refill:  0    Order Specific Question:  Supervising Provider    Answer:  Ihor Gully D Hensley Laird Runnion, PA-C 08/10/14 1435

## 2014-08-10 NOTE — ED Notes (Signed)
Pt has been suffering from a sore throat and sinus infection since Wednesday night.

## 2014-08-14 ENCOUNTER — Encounter: Payer: Self-pay | Admitting: Neurology

## 2014-08-26 ENCOUNTER — Ambulatory Visit (HOSPITAL_COMMUNITY): Payer: No Typology Code available for payment source | Attending: Physician Assistant

## 2014-08-26 ENCOUNTER — Encounter (HOSPITAL_COMMUNITY): Payer: Self-pay | Admitting: *Deleted

## 2014-08-26 ENCOUNTER — Emergency Department (INDEPENDENT_AMBULATORY_CARE_PROVIDER_SITE_OTHER)
Admission: EM | Admit: 2014-08-26 | Discharge: 2014-08-26 | Disposition: A | Discharge status: Home / Self Care | Payer: No Typology Code available for payment source | Source: Home / Self Care | Attending: Family Medicine | Admitting: Family Medicine

## 2014-08-26 DIAGNOSIS — J069 Acute upper respiratory infection, unspecified: Secondary | ICD-10-CM

## 2014-08-26 DIAGNOSIS — R05 Cough: Secondary | ICD-10-CM | POA: Insufficient documentation

## 2014-08-26 DIAGNOSIS — R059 Cough, unspecified: Secondary | ICD-10-CM

## 2014-08-26 MED ORDER — IPRATROPIUM BROMIDE 0.06 % NA SOLN: 2.0000 | Freq: Four times a day (QID) | NASAL | Status: DC

## 2014-08-26 MED ORDER — BENZONATATE 100 MG PO CAPS: 100.0000 mg | ORAL_CAPSULE | Freq: Three times a day (TID) | ORAL | Status: DC | PRN

## 2014-08-26 NOTE — Discharge Instructions (Signed)
Chest films negative for pneumonia  Exam normal Vital signs normal with exception of elevated blood pressure.  Should pursue symptomatic care at home of upper respiratory symptoms and provide patient with prescription for cough medication.  PCP follow up if symptoms persist. Upper Respiratory Infection, Adult An upper respiratory infection (URI) is also sometimes known as the common cold. The upper respiratory tract includes the nose, sinuses, throat, trachea, and bronchi. Bronchi are the airways leading to the lungs. Most people improve within 1 week, but symptoms can last up to 2 weeks. A residual cough may last even longer.  CAUSES Many different viruses can infect the tissues lining the upper respiratory tract. The tissues become irritated and inflamed and often become very moist. Mucus production is also common. A cold is contagious. You can easily spread the virus to others by oral contact. This includes kissing, sharing a glass, coughing, or sneezing. Touching your mouth or nose and then touching a surface, which is then touched by another person, can also spread the virus. SYMPTOMS  Symptoms typically develop 1 to 3 days after you come in contact with a cold virus. Symptoms vary from person to person. They may include:  Runny nose.  Sneezing.  Nasal congestion.  Sinus irritation.  Sore throat.  Loss of voice (laryngitis).  Cough.  Fatigue.  Muscle aches.  Loss of appetite.  Headache.  Low-grade fever. DIAGNOSIS  You might diagnose your own cold based on familiar symptoms, since most people get a cold 2 to 3 times a year. Your caregiver can confirm this based on your exam. Most importantly, your caregiver can check that your symptoms are not due to another disease such as strep throat, sinusitis, pneumonia, asthma, or epiglottitis. Blood tests, throat tests, and X-rays are not necessary to diagnose a common cold, but they may sometimes be helpful in excluding other more  serious diseases. Your caregiver will decide if any further tests are required. RISKS AND COMPLICATIONS  You may be at risk for a more severe case of the common cold if you smoke cigarettes, have chronic heart disease (such as heart failure) or lung disease (such as asthma), or if you have a weakened immune system. The very young and very old are also at risk for more serious infections. Bacterial sinusitis, middle ear infections, and bacterial pneumonia can complicate the common cold. The common cold can worsen asthma and chronic obstructive pulmonary disease (COPD). Sometimes, these complications can require emergency medical care and may be life-threatening. PREVENTION  The best way to protect against getting a cold is to practice good hygiene. Avoid oral or hand contact with people with cold symptoms. Wash your hands often if contact occurs. There is no clear evidence that vitamin C, vitamin E, echinacea, or exercise reduces the chance of developing a cold. However, it is always recommended to get plenty of rest and practice good nutrition. TREATMENT  Treatment is directed at relieving symptoms. There is no cure. Antibiotics are not effective, because the infection is caused by a virus, not by bacteria. Treatment may include:  Increased fluid intake. Sports drinks offer valuable electrolytes, sugars, and fluids.  Breathing heated mist or steam (vaporizer or shower).  Eating chicken soup or other clear broths, and maintaining good nutrition.  Getting plenty of rest.  Using gargles or lozenges for comfort.  Controlling fevers with ibuprofen or acetaminophen as directed by your caregiver.  Increasing usage of your inhaler if you have asthma. Zinc gel and zinc lozenges, taken in the  first 24 hours of the common cold, can shorten the duration and lessen the severity of symptoms. Pain medicines may help with fever, muscle aches, and throat pain. A variety of non-prescription medicines are  available to treat congestion and runny nose. Your caregiver can make recommendations and may suggest nasal or lung inhalers for other symptoms.  HOME CARE INSTRUCTIONS   Only take over-the-counter or prescription medicines for pain, discomfort, or fever as directed by your caregiver.  Use a warm mist humidifier or inhale steam from a shower to increase air moisture. This may keep secretions moist and make it easier to breathe.  Drink enough water and fluids to keep your urine clear or pale yellow.  Rest as needed.  Return to work when your temperature has returned to normal or as your caregiver advises. You may need to stay home longer to avoid infecting others. You can also use a face mask and careful hand washing to prevent spread of the virus. SEEK MEDICAL CARE IF:   After the first few days, you feel you are getting worse rather than better.  You need your caregiver's advice about medicines to control symptoms.  You develop chills, worsening shortness of breath, or brown or red sputum. These may be signs of pneumonia.  You develop yellow or brown nasal discharge or pain in the face, especially when you bend forward. These may be signs of sinusitis.  You develop a fever, swollen neck glands, pain with swallowing, or white areas in the back of your throat. These may be signs of strep throat. SEEK IMMEDIATE MEDICAL CARE IF:   You have a fever.  You develop severe or persistent headache, ear pain, sinus pain, or chest pain.  You develop wheezing, a prolonged cough, cough up blood, or have a change in your usual mucus (if you have chronic lung disease).  You develop sore muscles or a stiff neck. Document Released: 03/03/2001 Document Revised: 11/30/2011 Document Reviewed: 12/13/2013 [facility] Patient Information 2015 [place], [place]. This information is not intended to replace advice given to you by your health care provider. Make sure you discuss any questions you have with your  health care provider.

## 2014-08-26 NOTE — ED Notes (Signed)
Patient transported to X-ray at hospital in shuttle with [facility] staff driving.

## 2014-08-26 NOTE — ED Provider Notes (Signed)
CSN: [id number]     Arrival date & time 08/26/14  0920 History   First MD Initiated Contact with Patient 08/26/14 [phone]     Chief Complaint  Patient presents with  . URI   (Consider location/radiation/quality/duration/timing/severity/associated sxs/prior Treatment) HPI Comments: Non smoker PCP: [facility] [employer] Reports some chest discomfort at right posterior chest with coughing  Patient is a 63 y.o. female presenting with URI. The history is provided by the patient.  URI Presenting symptoms: congestion, cough, ear pain, fatigue, fever and rhinorrhea   Presenting symptoms: no facial pain and no sore throat   Severity:  Mild Onset quality:  Gradual Duration: several days. Timing:  Constant Progression:  Unchanged Chronicity:  New Associated symptoms: no arthralgias, no headaches, no myalgias, no neck pain, no sinus pain, no sneezing, no swollen glands and no wheezing     Past Medical History  Diagnosis Date  . Diabetes mellitus type II, [name]   . Frequent headaches   . Chronic back pain   . Chronic kidney disease 03/2013    failure due to meds  . Chest pain   . Obesity   . Carpal tunnel syndrome 06/14/2014  . 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 Cesarean section    . Knee surgery Left   . Thyroid surgery      goiter   Family History  Problem Relation Age of Onset  . Cancer Mother     cervical- mets  . Cancer Father   . Heart disease Father   . Diabetes Father   . Diabetes Sister   . Heart disease Sister   . Heart disease Sister   . COPD Sister    History  Substance Use Topics  . Smoking status: Never Smoker   . Smokeless tobacco: Never Used  . Alcohol Use: No   OB History    Gravida Para Term Preterm AB TAB SAB Ectopic Multiple Living   4 3 2 1 1   1 2 6       Review of Systems  Constitutional: Positive for fever and fatigue.  HENT: Positive for congestion, ear pain and rhinorrhea. Negative for sneezing and sore throat.   Eyes: Negative.   Respiratory: Positive for cough. Negative for chest tightness, shortness of breath and wheezing.   Cardiovascular: Negative.   Gastrointestinal: Negative.   Musculoskeletal: Negative for myalgias, arthralgias and neck pain.  Neurological: Negative for headaches.    Allergies  Amoxicillin-pot clavulanate and Sulfa antibiotics  Home Medications   Prior to Admission medications   Medication Sig Start Date End Date Taking? Authorizing Provider  gabapentin (NEURONTIN) 300 MG capsule Take 1 capsule (300 mg total) by mouth 4 (four) times daily. 03/21/14  Yes Olugbemiga Essie Christine, MD  insulin aspart (NOVOLOG) 100 UNIT/ML FlexPen Inject 30 Units into the skin 3 (three) times daily with meals. 06/05/14  Yes Josalyn C Funches, MD  insulin glargine (LANTUS) 100 UNIT/ML injection Inject 0.6 mLs (60 Units total) into the skin at bedtime. 08/09/14  Yes Josalyn C Funches, MD  isosorbide mononitrate (IMDUR) 30 MG 24 hr tablet Take 1 tablet (30 mg total) by mouth daily. 07/27/13  Yes Robbie Lis, MD  levothyroxine (SYNTHROID, LEVOTHROID) 100 MCG tablet Take 1 tablet (100 mcg total) by  mouth daily. 06/07/14  Yes Josalyn C Funches, MD  lisinopril (PRINIVIL,ZESTRIL) 40 MG tablet Take 1 tablet (40 mg total) by mouth daily.   Yes Tresa Garter, MD  loratadine (CLARITIN) 10 MG tablet Take 1 tablet (10 mg total) by mouth daily. 06/22/13  Yes Clanford Marisa Hua, MD  methocarbamol (ROBAXIN) 500 MG tablet Take 1 tablet (500 mg total) by mouth 4 (four) times daily. 03/09/13  Yes Robbie Lis, MD  metoprolol succinate (TOPROL-XL) 50 MG 24 hr tablet Take 2 tablets (100 mg total) by mouth daily. Take with or immediately following a meal. 03/21/14  Yes Olugbemiga E Jegede, MD  mometasone (NASONEX) 50 MCG/ACT nasal spray Place 2 sprays into  the nose daily. 06/05/14  Yes Josalyn C Funches, MD  Multiple Vitamins-Minerals (EYE VITAMINS) CAPS Take 1 capsule by mouth daily. 08/11/12  Yes Thurnell Lose, MD  olmesartan (BENICAR) 40 MG tablet Take 1 tablet (40 mg total) by mouth daily. 03/21/14  Yes Tresa Garter, MD  pantoprazole (PROTONIX) 40 MG tablet Take 1 tablet (40 mg total) by mouth 2 (two) times daily. 08/09/14  Yes Josalyn C Funches, MD  simvastatin (ZOCOR) 20 MG tablet Take 1 tablet (20 mg total) by mouth at bedtime. 07/27/13  Yes Robbie Lis, MD  traMADol (ULTRAM) 50 MG tablet Take 100 mg by mouth every 6 (six) hours as needed for severe pain.   Yes Historical Provider, MD  benzonatate (TESSALON) 100 MG capsule Take 1 capsule (100 mg total) by mouth 3 (three) times daily as needed for cough. 08/26/14   Lutricia Feil, PA  butalbital-acetaminophen-caffeine (FIORICET, ESGIC) [phone] MG per tablet Take 1-2 tablets every 6 hours as needed for headache or migraine 02/07/14   Tresa Garter, MD  cephALEXin (KEFLEX) 500 MG capsule Take 2 capsules (1,000 mg total) by mouth 3 (three) times daily. 08/10/14   Liam Graham, PA-C  diazepam (VALIUM) 5 MG tablet Take 1 tablet (5 mg total) by mouth every 12 (twelve) hours as needed for anxiety. 07/27/13   Robbie Lis, MD  nitroGLYCERIN (NITROSTAT) 0.4 MG SL tablet Place 0.4 mg under the tongue every 5 (five) minutes as needed for chest pain. 10/06/12   Thurnell Lose, MD  oxyCODONE-acetaminophen (PERCOCET/ROXICET) 5-325 MG per tablet Take 1 tablet by mouth every 8 (eight) hours as needed for pain. 04/15/13   Velvet Bathe, MD  sertraline (ZOLOFT) 50 MG tablet Take 50 mg by mouth 3 (three) times daily as needed.     Historical Provider, MD   BP 161/79 mmHg  Pulse 82  Temp(Src) 98.2 F (36.8 C) (Oral)  Resp 16  SpO2 97% Physical Exam  Constitutional: She is oriented to person, place, and time. She appears well-developed and well-nourished.  +obese  HENT:  Head:  Normocephalic and atraumatic.  Right Ear: Hearing, external ear and ear canal normal. No mastoid tenderness. A middle ear effusion is present.  Left Ear: Hearing, external ear and ear canal normal. No mastoid tenderness. A middle ear effusion is present.  Nose: Nose normal.  Mouth/Throat: Uvula is midline, oropharynx is clear and moist and mucous membranes are normal. No oropharyngeal exudate.  Eyes: Conjunctivae are normal. No scleral icterus.  Neck: Normal range of motion. Neck supple.  Cardiovascular: Normal rate, regular rhythm and normal heart sounds.   Pulmonary/Chest: Effort normal and breath sounds normal. No respiratory distress. She has no wheezes.  Musculoskeletal: Normal range of motion.  Lymphadenopathy:    She has  no cervical adenopathy.  Neurological: She is alert and oriented to person, place, and time.  Skin: Skin is warm and dry.  Psychiatric: She has a normal mood and affect. Her behavior is normal.  Nursing note and vitals reviewed.   ED Course  Procedures (including critical care time) Labs Review Labs Reviewed - No data to display  Imaging Review Dg Chest 2 View  08/26/2014   CLINICAL DATA:  Productive cough for 2 weeks  EXAM: CHEST  2 VIEW  COMPARISON:  12/23/2013  FINDINGS: The heart size and mediastinal contours are within normal limits. Both lungs are clear. The visualized skeletal structures are unremarkable.  IMPRESSION: No active cardiopulmonary disease.   Electronically Signed   By: Maryclare Bean M.D.   On: 08/26/2014 11:13     MDM   1. URI (upper respiratory infection)   2. Cough   Chest films negative for acute process.  Exam benign Vitals grossly normal with exception of elevated SBP.  Will advise symptomatic care at home of URI sx and provide patient with tessalon rx for cough. Will not provide narcotic cough medication as patient already taking multiple medications with sedation side effects. PCP follow up if symptoms persist.  Lutricia Feil, PA 08/26/14 [phone]

## 2014-08-26 NOTE — ED Notes (Signed)
C/o URI. Here 2 weeks ago for same.  States its worse.  More SOB, more nasal congestion, ears hurt and pain under R shoulder blade.  No fever.  Cough prod. of green sputum.  Taking Tylenol, Advil, and  Alleve.

## 2014-08-26 NOTE — ED Notes (Signed)
Patient gone to Radiology for CXR

## 2014-09-18 ENCOUNTER — Ambulatory Visit: Payer: No Typology Code available for payment source

## 2014-09-19 ENCOUNTER — Ambulatory Visit (HOSPITAL_COMMUNITY)
Admission: RE | Admit: 2014-09-19 | Discharge: 2014-09-19 | Disposition: A | Discharge status: Home / Self Care | Payer: No Typology Code available for payment source | Source: Ambulatory Visit | Attending: Internal Medicine | Admitting: Internal Medicine

## 2014-09-19 ENCOUNTER — Ambulatory Visit: Payer: No Typology Code available for payment source | Attending: Family Medicine

## 2014-09-19 VITALS — BP 130/83 | HR 77 | Temp 98.0°F | Resp 16

## 2014-09-19 DIAGNOSIS — R05 Cough: Secondary | ICD-10-CM

## 2014-09-19 DIAGNOSIS — R059 Cough, unspecified: Secondary | ICD-10-CM

## 2014-09-19 DIAGNOSIS — R0602 Shortness of breath: Secondary | ICD-10-CM | POA: Insufficient documentation

## 2014-09-19 DIAGNOSIS — R509 Fever, unspecified: Secondary | ICD-10-CM | POA: Insufficient documentation

## 2014-09-19 MED ORDER — BENZONATATE 100 MG PO CAPS: 100.0000 mg | ORAL_CAPSULE | Freq: Three times a day (TID) | ORAL | Status: DC | PRN

## 2014-09-19 MED ORDER — AZITHROMYCIN 250 MG PO TABS: ORAL_TABLET | ORAL | Status: DC

## 2014-09-19 NOTE — Progress Notes (Unsigned)
Patient here for cough and congestion Patient states has been to urgent care for the same symptoms and not any better Had a chest xray approx. Three weeks ago and states was negative Will put orders in for another chest xray today and prescriptions for cough medications and zpack

## 2014-10-01 ENCOUNTER — Ambulatory Visit: Payer: No Typology Code available for payment source

## 2014-10-02 ENCOUNTER — Telehealth: Payer: Self-pay

## 2014-10-02 ENCOUNTER — Ambulatory Visit: Payer: No Typology Code available for payment source

## 2014-10-02 NOTE — Telephone Encounter (Signed)
Attempted to call patient two times this am to let her know the PPD is in Patient not available Unable to leave voice mail because the mail box is full

## 2014-10-15 ENCOUNTER — Other Ambulatory Visit: Payer: Self-pay | Admitting: Internal Medicine

## 2014-11-02 ENCOUNTER — Ambulatory Visit: Payer: No Typology Code available for payment source | Admitting: Family Medicine

## 2014-11-06 ENCOUNTER — Other Ambulatory Visit: Payer: Self-pay | Admitting: Emergency Medicine

## 2014-11-06 ENCOUNTER — Telehealth: Payer: Self-pay

## 2014-11-06 ENCOUNTER — Telehealth: Payer: Self-pay | Admitting: Family Medicine

## 2014-11-06 DIAGNOSIS — E119 Type 2 diabetes mellitus without complications: Secondary | ICD-10-CM

## 2014-11-06 MED ORDER — INSULIN GLARGINE 100 UNIT/ML ~~LOC~~ SOLN: 60.0000 [IU] | Freq: Every day | SUBCUTANEOUS | Status: DC

## 2014-11-06 MED ORDER — INSULIN ASPART 100 UNIT/ML FLEXPEN: 30.0000 [IU] | PEN_INJECTOR | Freq: Three times a day (TID) | SUBCUTANEOUS | Status: DC

## 2014-11-06 MED ORDER — LEVOTHYROXINE SODIUM 100 MCG PO TABS: 100.0000 ug | ORAL_TABLET | Freq: Every day | ORAL | Status: DC

## 2014-11-06 NOTE — Telephone Encounter (Signed)
Patient has come in today to request a medication refill for Anita James ?; Please f/u with patient about this request; patient spoke with [name]

## 2014-11-06 NOTE — Telephone Encounter (Signed)
Patient called requesting a refill on her novolog , lantus and thyroid medicine Patient states her appointment this past Friday was cancelled and is out of these medications A one month supply was sent to the health department

## 2014-11-12 ENCOUNTER — Ambulatory Visit: Payer: No Typology Code available for payment source | Attending: Family Medicine | Admitting: Family Medicine

## 2014-11-12 ENCOUNTER — Encounter: Payer: Self-pay | Admitting: Family Medicine

## 2014-11-12 ENCOUNTER — Ambulatory Visit: Payer: No Typology Code available for payment source

## 2014-11-12 VITALS — BP 140/86 | HR 81 | Temp 98.0°F | Resp 16 | Ht 66.0 in | Wt 268.0 lb

## 2014-11-12 DIAGNOSIS — E119 Type 2 diabetes mellitus without complications: Secondary | ICD-10-CM

## 2014-11-12 DIAGNOSIS — Z79899 Other long term (current) drug therapy: Secondary | ICD-10-CM | POA: Insufficient documentation

## 2014-11-12 DIAGNOSIS — I10 Essential (primary) hypertension: Secondary | ICD-10-CM | POA: Insufficient documentation

## 2014-11-12 DIAGNOSIS — G5603 Carpal tunnel syndrome, bilateral upper limbs: Secondary | ICD-10-CM

## 2014-11-12 DIAGNOSIS — G5601 Carpal tunnel syndrome, right upper limb: Secondary | ICD-10-CM | POA: Insufficient documentation

## 2014-11-12 DIAGNOSIS — E668 Other obesity: Secondary | ICD-10-CM | POA: Insufficient documentation

## 2014-11-12 DIAGNOSIS — E1140 Type 2 diabetes mellitus with diabetic neuropathy, unspecified: Secondary | ICD-10-CM | POA: Insufficient documentation

## 2014-11-12 DIAGNOSIS — E785 Hyperlipidemia, unspecified: Secondary | ICD-10-CM

## 2014-11-12 DIAGNOSIS — Z794 Long term (current) use of insulin: Secondary | ICD-10-CM | POA: Insufficient documentation

## 2014-11-12 DIAGNOSIS — E038 Other specified hypothyroidism: Secondary | ICD-10-CM

## 2014-11-12 DIAGNOSIS — Z6841 Body Mass Index (BMI) 40.0 and over, adult: Secondary | ICD-10-CM | POA: Insufficient documentation

## 2014-11-12 DIAGNOSIS — G5602 Carpal tunnel syndrome, left upper limb: Secondary | ICD-10-CM | POA: Insufficient documentation

## 2014-11-12 LAB — POCT GLYCOSYLATED HEMOGLOBIN (HGB A1C): Hemoglobin A1C: 6.2

## 2014-11-12 LAB — GLUCOSE, POCT (MANUAL RESULT ENTRY): POC Glucose: 163 mg/dl — AB (ref 70–99)

## 2014-11-12 MED ORDER — INSULIN GLARGINE 100 UNIT/ML ~~LOC~~ SOLN: 60.0000 [IU] | Freq: Every day | SUBCUTANEOUS | Status: DC

## 2014-11-12 MED ORDER — METOPROLOL SUCCINATE ER 50 MG PO TB24: 100.0000 mg | ORAL_TABLET | Freq: Every day | ORAL | Status: DC

## 2014-11-12 MED ORDER — TRAMADOL HCL 50 MG PO TABS: 100.0000 mg | ORAL_TABLET | Freq: Two times a day (BID) | ORAL | Status: DC | PRN

## 2014-11-12 MED ORDER — INSULIN ASPART 100 UNIT/ML FLEXPEN: 15.0000 [IU] | PEN_INJECTOR | Freq: Three times a day (TID) | SUBCUTANEOUS | Status: DC

## 2014-11-12 MED ORDER — LISINOPRIL 40 MG PO TABS: 40.0000 mg | ORAL_TABLET | Freq: Every day | ORAL | Status: DC

## 2014-11-12 MED ORDER — DAPAGLIFLOZIN PROPANEDIOL 5 MG PO TABS: 5.0000 mg | ORAL_TABLET | Freq: Every day | ORAL | Status: DC

## 2014-11-12 MED ORDER — INSULIN ASPART 100 UNIT/ML FLEXPEN: 30.0000 [IU] | PEN_INJECTOR | Freq: Three times a day (TID) | SUBCUTANEOUS | Status: DC

## 2014-11-12 MED ORDER — PREGABALIN 50 MG PO CAPS: 50.0000 mg | ORAL_CAPSULE | Freq: Three times a day (TID) | ORAL | Status: DC

## 2014-11-12 MED ORDER — LEVOTHYROXINE SODIUM 100 MCG PO TABS: 100.0000 ug | ORAL_TABLET | Freq: Every day | ORAL | Status: DC

## 2014-11-12 MED ORDER — DAPAGLIFLOZIN PROPANEDIOL 10 MG PO TABS: 10.0000 mg | ORAL_TABLET | Freq: Every day | ORAL | Status: DC

## 2014-11-12 MED ORDER — INSULIN GLARGINE 100 UNIT/ML ~~LOC~~ SOLN: 45.0000 [IU] | Freq: Every day | SUBCUTANEOUS | Status: DC

## 2014-11-12 NOTE — Assessment & Plan Note (Addendum)
Diabetes: goal A1c <7  Diabetes at goal Some low sugars Start farxiga 5 mg daily for weight loss Decrease lantus to 45 U  Decrease novolog to 15 U   3. Diabetic neuropathy: Continue gabapentin for now  Lyrica is new, this is via PASS, this will replace gabapentin starting dose of 50 mg three times daily, max dose of 100 mg three times daily.

## 2014-11-12 NOTE — Progress Notes (Signed)
   Subjective:    [name], female    DOB: 10-09-1950, 64 y.o.   MRN: [id number] CC: medication refill, carpal tunnel letter for work, diabetic nerve pain  HPI 64 yo F with DM2, HTN, hypothyroidism:  1. DM2: taking insulin (novolog 30 TID and lantus 60 daily) and farxiga 10 mg daily. Experienced renal disease with acute rise of Cr will taking metformin in 03/2013 so she never wants to take metformin again. Has some low CBGs down to 40.  Admits to weight gain and burning in feet. 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 Also taking gabapentin 600 mg BID.   2. Carpal tunnel: both wrist. Worse since starting work. Wearing braces would like a letter for work so she can wear them at work.   3. HTN: taking toprol 100 mg daily and lisinopril 40 mg daily. Reports that she is not taking benicar. No cough, swelling, CP or SOB.   Soc Hx: non smoker  Review of Systems As per HPI     Objective:   Physical Exam BP 140/86 mmHg  Pulse 81  Temp(Src) 98 F (36.7 C)  Resp 16  Ht 5\' 6"  (1.676 m)  Wt 268 lb (121.564 kg)  BMI 43.28 kg/m2  SpO2 95% General appearance: alert, cooperative, no distress and moderately obese Lungs: normal WOB Extremities: extremities normal, atraumatic, no cyanosis or edema  Lab Results  Component Value Date   HGBA1C 6.0 06/05/2014  CBG 163   Lab Results  Component Value Date   HGBA1C 6.20 11/12/2014       Assessment & Plan:

## 2014-11-12 NOTE — Progress Notes (Signed)
Patient needs note so that she can wear her wrist braces at work Patient foot pain she stands on her feet at work pain is burning in nature Patient c/o of left elbow pain she wears a brace Patient needs medication refills

## 2014-11-12 NOTE — Patient Instructions (Addendum)
Anita James,   Thank you for coming in today.   1. HTN:  Goal BP < 140/90 Metoprolol 100 mg once daily Lisinopril 40 mg once daily   2. Diabetes: goal A1c <7  Diabetes at goal Some low sugars Start farxiga 5 mg daily for weight loss Decrease lantus to 45 U  Decrease novolog to 15 U   3. Diabetic neuropathy: Continue gabapentin for now  Lyrica is new, this is via PASS, this will replace gabapentin starting dose of 50 mg three times daily, max dose of 100 mg three times daily.   4. Wrist pains and swelling: letter for you to wear braces  F/u in 3 months   Dr. Adrian Blackwater

## 2014-11-12 NOTE — Assessment & Plan Note (Signed)
Wrist pains and swelling: letter for you to wear braces at work provided

## 2014-11-12 NOTE — Assessment & Plan Note (Signed)
1. HTN:  Goal BP < 140/90 Metoprolol 100 mg once daily Lisinopril 40 mg once daily

## 2014-11-13 LAB — MICROALBUMIN / CREATININE URINE RATIO
CREATININE, URINE: 158.7 mg/dL
MICROALB/CREAT RATIO: 93.3 mg/g — AB (ref 0.0–30.0)
Microalb, Ur: 14.8 mg/dL — ABNORMAL HIGH (ref <2.0)

## 2014-11-13 LAB — LIPID PANEL
Cholesterol: 190 mg/dL (ref 0–200)
HDL: 40 mg/dL — ABNORMAL LOW (ref >=46)
LDL CALC: 103 mg/dL — AB (ref 0–99)
Total CHOL/HDL Ratio: 4.8 Ratio
Triglycerides: 235 mg/dL — ABNORMAL HIGH (ref <150)
VLDL: 47 mg/dL — ABNORMAL HIGH (ref 0–40)

## 2014-11-13 LAB — TSH: TSH: 1.707 u[IU]/mL (ref 0.350–4.500)

## 2014-11-13 MED ORDER — ATORVASTATIN CALCIUM 40 MG PO TABS: 40.0000 mg | ORAL_TABLET | Freq: Every day | ORAL | Status: DC

## 2014-11-13 NOTE — Addendum Note (Signed)
Addended by: Boykin Nearing on: 11/13/2014 11:11 AM   Modules accepted: Orders

## 2014-11-13 NOTE — Assessment & Plan Note (Signed)
A: persistent high lipids P: Change from simvastatin 20 mg to lipitor 40 mg daily.

## 2014-11-15 ENCOUNTER — Telehealth: Payer: Self-pay | Admitting: *Deleted

## 2014-11-15 NOTE — Telephone Encounter (Signed)
-----   Message from Minerva Ends, MD sent at 11/13/2014 11:10 AM EST ----- Normal TSH continue current dose of synthroid. Elevated cholesterol recommend change from simvastatin to stronger statin lipitor 40 mg daily.  Urine microalbumin elevated continue lisinopril 40 mg daily

## 2014-11-15 NOTE — Telephone Encounter (Signed)
Left voice message to return call 

## 2014-12-03 ENCOUNTER — Other Ambulatory Visit: Payer: No Typology Code available for payment source

## 2014-12-04 ENCOUNTER — Ambulatory Visit: Payer: Self-pay | Attending: Family Medicine | Admitting: *Deleted

## 2014-12-04 DIAGNOSIS — Z0000 Encounter for general adult medical examination without abnormal findings: Secondary | ICD-10-CM

## 2014-12-04 MED ORDER — TUBERCULIN PPD 5 UNIT/0.1ML ID SOLN
5.0000 [IU] | Freq: Once | INTRADERMAL | Status: AC
  Administered 2014-12-04: 5 [IU] via INTRADERMAL

## 2014-12-04 NOTE — Progress Notes (Signed)
Patient presents for PPD placement for work Denies known exposure to TB Tuberculin skin test applied to left ventral forearm.  Patient aware that she needs to return in 48 hours for PPD reading

## 2014-12-06 LAB — TB SKIN TEST
INDURATION: 0 mm
TB Skin Test: NEGATIVE

## 2015-01-02 ENCOUNTER — Other Ambulatory Visit: Payer: Self-pay | Admitting: *Deleted

## 2015-01-02 MED ORDER — MOMETASONE FUROATE 50 MCG/ACT NA SUSP: 2.0000 | Freq: Every day | NASAL | Status: DC

## 2015-01-08 ENCOUNTER — Ambulatory Visit (HOSPITAL_BASED_OUTPATIENT_CLINIC_OR_DEPARTMENT_OTHER): Payer: Self-pay | Admitting: *Deleted

## 2015-01-08 ENCOUNTER — Ambulatory Visit: Payer: Self-pay | Attending: Family Medicine

## 2015-01-08 ENCOUNTER — Telehealth: Payer: Self-pay | Admitting: *Deleted

## 2015-01-08 ENCOUNTER — Encounter: Payer: Self-pay | Admitting: *Deleted

## 2015-01-08 VITALS — BP 100/69 | HR 84 | Temp 98.1°F | Resp 16

## 2015-01-08 DIAGNOSIS — R739 Hyperglycemia, unspecified: Secondary | ICD-10-CM

## 2015-01-08 DIAGNOSIS — E1165 Type 2 diabetes mellitus with hyperglycemia: Secondary | ICD-10-CM | POA: Insufficient documentation

## 2015-01-08 LAB — GLUCOSE, POCT (MANUAL RESULT ENTRY)
POC Glucose: 295 mg/dl — AB (ref 70–99)
POC Glucose: 266 mg/dl — AB (ref 70–99)

## 2015-01-08 MED ORDER — INSULIN ASPART 100 UNIT/ML ~~LOC~~ SOLN
10.0000 [IU] | Freq: Once | SUBCUTANEOUS | Status: AC
  Administered 2015-01-08: 10 [IU] via SUBCUTANEOUS

## 2015-01-08 NOTE — Progress Notes (Signed)
[name], female   DOB: 1950-11-01, 64 y.o.   MRN: [id number] I have reviewed the note and agree.  Boykin Nearing

## 2015-01-08 NOTE — Telephone Encounter (Signed)
Encounter from today closed

## 2015-01-08 NOTE — Progress Notes (Signed)
Patient walked in to clinic this afternoon saying that she had gone to work on Friday night and was feeling poorly.  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  Patient says her blood sugar was checked at work and it was greater than 600.  Patient refused treatment and says she went to her car and administered insulin there and then her job sent her home.  She walked in today saying that her job had faxed [place] a paper yesterday that had to be filled out and faxed back to them by 1700 this evening so that she could go to work.  Patient's cbg here was 297 and per protocol, 10 units Novolog given to patient.  Patient's vitals are WNL.  Spoke to PCP who said patient would need to make an appointment to be seen and a medication review and diabetes management done in order for her to complete her form.  Patient not happy about this and demanding to be seen today.  I explained there was no provider in the clinic that had any patient appointments available this afternoon.    Patient currently being seen by the RN and is waiting in the exam room for 20 minutes so that her blood glucose can be rechecked after 10 units Novolog given.  Patient's CBG rechecked and sugar was 266.  Patient instructed to go home and start checking her blood sugars 4 times per day-before meals and at bedtime and to take her Novolog and Lantus as prescribed by PCP as well as take her [place].  Patient was talked to about eating a low carbohydrate diet rich in protein and fiber and to drink lots of fluids.    Patient is agreement and was told to stop by the front schedulers to make an appointment to see Dr. Adrian Blackwater as soon as possible.  Med list reviewed; patient reports taking all meds as directed   CBG  Blood pressure 100/69, pulse 84, temperature 98.1 F (36.7 C), resp. rate 16, SpO2 100 %.    Patient advised to call for med refills at least 7 days before running out so as not to go without.  Patient aware that she  is to  f/u with PCP  Patient given literature on Diabetes and Food, Diabetes and Exercise, Basic Carb Counting, Hypoglycemia, and The Plate Method

## 2015-01-09 ENCOUNTER — Other Ambulatory Visit: Payer: Self-pay | Admitting: Internal Medicine

## 2015-01-09 ENCOUNTER — Ambulatory Visit: Payer: Self-pay | Attending: Internal Medicine

## 2015-01-15 ENCOUNTER — Ambulatory Visit: Payer: Self-pay | Attending: Family Medicine | Admitting: Family Medicine

## 2015-01-15 ENCOUNTER — Encounter: Payer: Self-pay | Admitting: Family Medicine

## 2015-01-15 ENCOUNTER — Telehealth: Payer: Self-pay | Admitting: Family Medicine

## 2015-01-15 VITALS — BP 109/74 | HR 82 | Temp 97.8°F | Resp 16 | Ht 66.0 in | Wt 244.0 lb

## 2015-01-15 DIAGNOSIS — L298 Other pruritus: Secondary | ICD-10-CM | POA: Insufficient documentation

## 2015-01-15 DIAGNOSIS — R358 Other polyuria: Secondary | ICD-10-CM | POA: Insufficient documentation

## 2015-01-15 DIAGNOSIS — Z794 Long term (current) use of insulin: Secondary | ICD-10-CM | POA: Insufficient documentation

## 2015-01-15 DIAGNOSIS — E1140 Type 2 diabetes mellitus with diabetic neuropathy, unspecified: Secondary | ICD-10-CM | POA: Insufficient documentation

## 2015-01-15 DIAGNOSIS — IMO0002 Reserved for concepts with insufficient information to code with codable children: Secondary | ICD-10-CM

## 2015-01-15 DIAGNOSIS — B3731 Acute candidiasis of vulva and vagina: Secondary | ICD-10-CM | POA: Insufficient documentation

## 2015-01-15 DIAGNOSIS — B373 Candidiasis of vulva and vagina: Secondary | ICD-10-CM

## 2015-01-15 DIAGNOSIS — E1165 Type 2 diabetes mellitus with hyperglycemia: Secondary | ICD-10-CM

## 2015-01-15 DIAGNOSIS — R5383 Other fatigue: Secondary | ICD-10-CM | POA: Insufficient documentation

## 2015-01-15 DIAGNOSIS — E119 Type 2 diabetes mellitus without complications: Secondary | ICD-10-CM

## 2015-01-15 LAB — POCT URINALYSIS DIPSTICK
Bilirubin, UA: NEGATIVE
GLUCOSE UA: 500
Ketones, UA: NEGATIVE
Leukocytes, UA: NEGATIVE
NITRITE UA: NEGATIVE
PH UA: 5.5
Protein, UA: NEGATIVE
Spec Grav, UA: <=1.005
Urobilinogen, UA: 0.2

## 2015-01-15 LAB — POCT GLYCOSYLATED HEMOGLOBIN (HGB A1C): HEMOGLOBIN A1C: 13.3

## 2015-01-15 LAB — GLUCOSE, POCT (MANUAL RESULT ENTRY): POC GLUCOSE: 541 mg/dL — AB (ref 70–99)

## 2015-01-15 MED ORDER — GABAPENTIN 300 MG PO CAPS: 600.0000 mg | ORAL_CAPSULE | Freq: Two times a day (BID) | ORAL | Status: DC

## 2015-01-15 MED ORDER — INSULIN GLARGINE 100 UNIT/ML ~~LOC~~ SOLN: 60.0000 [IU] | Freq: Every day | SUBCUTANEOUS | Status: DC

## 2015-01-15 MED ORDER — FLUCONAZOLE 150 MG PO TABS: 150.0000 mg | ORAL_TABLET | ORAL | Status: DC

## 2015-01-15 MED ORDER — INSULIN ASPART 100 UNIT/ML FLEXPEN: 30.0000 [IU] | PEN_INJECTOR | Freq: Three times a day (TID) | SUBCUTANEOUS | Status: DC

## 2015-01-15 NOTE — Patient Instructions (Addendum)
Ms. Nordell,  Thank you for coming in today.  1. Diabetes: You are losing weight Sugar and A1c high  Goal sugar for return to work: < 300.   Increase lantus to 60 U Increase novolog to 30 U three times a day  Continue farxiga 10 mg daily  Diabetes blood sugar goals  Fasting (in AM before breakfast, 8 hrs of no eating or drinking (except water or unsweetened coffee or tea): 90-110 Post prandial: < 160,  2 hrs after meals  No low sugars: nothing < 70    Diflucan for yeast  F/u in 2-3 days RN CBG check F/u with me in 6 weeks   Dr. Adrian Blackwater

## 2015-01-15 NOTE — Progress Notes (Signed)
   Subjective:    [name], female    DOB: Jan 23, 1951, 64 y.o.   MRN: [id number] CC: DM2 f/u  HPI  1. CHRONIC DIABETES  Disease Monitoring: patient with fatigue, weight loss, polyuria, vaginal itching over last 3-4 weeks.   Blood Sugar Ranges: in 200s up to 300s   Polyuria: yes   Visual problems: no   Medication Compliance: yes farxiga 10 mg daily, lantus 45 U daily, novolog 20 U TID Medication Side Effects  Hypoglycemia: no   Soc Hx: non smoker  Review of Systems As per HPI    Objective:   Physical Exam BP 109/74 mmHg  Pulse 82  Temp(Src) 97.8 F (36.6 C) (Oral)  Resp 16  Ht 5\' 6"  (1.676 m)  Wt 244 lb (110.678 kg)  BMI 39.40 kg/m2  SpO2 97% General appearance: alert, cooperative and no distress Throat: lips, mucosa, and tongue normal; teeth and gums normal Lungs: clear to auscultation bilaterally Heart: regular rate and rhythm, S1, S2 normal, no murmur, click, rub or gallop Extremities: extremities normal, atraumatic, no cyanosis or edema Skin: 3 areas of redness with scaly skin on R lateral hip about 5 mm circular ares   Lab Results  Component Value Date   HGBA1C 6.20 11/12/2014   CBG 541  (ate cantaloupe and gave herself 20 U of novolog about 45 minute ago)   Lab Results  Component Value Date   HGBA1C 13.3 01/15/2015       Assessment & Plan:

## 2015-01-15 NOTE — Assessment & Plan Note (Signed)
A: decline in diabetes following decrease in high insulin doses and addition of farxiga at the time patient's A1c was <7. No evidence of active infection or inflammatory process. No CP to suggest MI. Patient would like to return to work w/o restrictions in 3 days if possible P:  Goal sugar for return to work: < 300.  Increase lantus to 60 U Increase novolog to 30 U three times a day  Continue farxiga 10 mg daily  Diabetes blood sugar goals  Fasting (in AM before breakfast, 8 hrs of no eating or drinking (except water or unsweetened coffee or tea): 90-110 Post prandial: < 160,  2 hrs after meals  No low sugars: nothing < 70    Low glycemic index foods Diflucan for yeast

## 2015-01-15 NOTE — Progress Notes (Signed)
F/U DM  Need form for work  Complaining of sore on hip

## 2015-01-15 NOTE — Telephone Encounter (Signed)
Pt says she is out of levothyroxine (SYNTHROID, LEVOTHROID) 100 MCG tablet and [facility], please f/u with pt when sent.

## 2015-01-16 NOTE — Telephone Encounter (Signed)
LVM to return call.  Pt has refills meed to call her pharmacy

## 2015-01-17 ENCOUNTER — Ambulatory Visit: Payer: Self-pay | Attending: Family Medicine | Admitting: *Deleted

## 2015-01-17 VITALS — BP 108/60 | HR 76 | Temp 98.1°F | Resp 20

## 2015-01-17 DIAGNOSIS — E1165 Type 2 diabetes mellitus with hyperglycemia: Secondary | ICD-10-CM | POA: Insufficient documentation

## 2015-01-17 DIAGNOSIS — Z794 Long term (current) use of insulin: Secondary | ICD-10-CM | POA: Insufficient documentation

## 2015-01-17 DIAGNOSIS — E038 Other specified hypothyroidism: Secondary | ICD-10-CM

## 2015-01-17 LAB — GLUCOSE, POCT (MANUAL RESULT ENTRY): POC GLUCOSE: 215 mg/dL — AB (ref 70–99)

## 2015-01-17 MED ORDER — LEVOTHYROXINE SODIUM 100 MCG PO TABS
100.0000 ug | ORAL_TABLET | Freq: Every day | ORAL | Status: DC
Start: 2015-01-17 — End: 2015-04-26

## 2015-01-17 NOTE — Progress Notes (Signed)
Patient presents for CBG and record review for T2DM, however, patient did not bring log or meter Med list reviewed; patient reports taking all meds as directed Taking 30 units novolog insulin tid AC, farxiga 10 mg daily and lantus 60 units qHS States fasting BS this AM was 134 but after drinking 1 can diet orange soda BS went up to 200s Patient given blood sugar log and instructed on use. Instructed to bring to all future visits. Patient requesting completed work form  CBG 215 3.5 hours after 1 slice of bread with peanut butter and 1 can diet orange soda  Lab Results  Component Value Date   HGBA1C 13.3 01/15/2015   Filed Vitals:   01/17/15 1157  BP: 108/60  Pulse: 76  Temp: 98.1 F (36.7 C)  Resp: 20    Per PCP: No changes to meds at this time Return for nurse visit in 6 weeks for CBG and record review Work form to be faxed today by RMA  Patient given literature on Diabetes and Food, Diabetes and Exercise, Basic Carb Counting, The Plate Method, Diabetes and Foot Care, and  Diabetic Neuropathy  Referral to Diabetic Nutrition and Education placed

## 2015-01-17 NOTE — Patient Instructions (Signed)
Diabetic Neuropathy Diabetic neuropathy is a nerve disease or nerve damage that is caused by diabetes mellitus. About half of all people with diabetes mellitus have some form of nerve damage. Nerve damage is more common in those who have had diabetes mellitus for many years and who generally have not had good control of their blood sugar (glucose) level. Diabetic neuropathy is a common complication of diabetes mellitus. There are three more common types of diabetic neuropathy and a fourth type that is less common and less understood:   Peripheral neuropathy--This is the most common type of diabetic neuropathy. It causes damage to the nerves of the feet and legs first and then eventually the hands and arms.The damage affects the ability to sense touch.  Autonomic neuropathy--This type causes damage to the autonomic nervous system, which controls the following functions:  Heartbeat.  Body temperature.  Blood pressure.  Urination.  Digestion.  Sweating.  Sexual function.  Focal neuropathy--Focal neuropathy can be painful and unpredictable and occurs most often in older adults with diabetes mellitus. It involves a specific nerve or one area and often comes on suddenly. It usually does not cause long-term problems.  Radiculoplexus neuropathy-- Sometimes called lumbosacral radiculoplexus neuropathy, radiculoplexus neuropathy affects the nerves of the thighs, hips, buttocks, or legs. It is more common in people with type 2 diabetes mellitus and in older men. It is characterized by debilitating pain, weakness, and atrophy, usually in the thigh muscles. CAUSES  The cause of peripheral, autonomic, and focal neuropathies is diabetes mellitus that is uncontrolled and high glucose levels. The cause of radiculoplexus neuropathy is unknown. However, it is thought to be caused by inflammation related to uncontrolled glucose levels. SIGNS AND SYMPTOMS  Peripheral Neuropathy Peripheral neuropathy develops  slowly over time. When the nerves of the feet and legs no longer work there may be:   Burning, stabbing, or aching pain in the legs or feet.  Inability to feel pressure or pain in your feet. This can lead to:  Thick calluses over pressure areas.  Pressure sores.  Ulcers.  Foot deformities.  Reduced ability to feel temperature changes.  Muscle weakness. Autonomic Neuropathy The symptoms of autonomic neuropathy vary depending on which nerves are affected. Symptoms may include:  Problems with digestion, such as:  Feeling sick to your stomach (nausea).  Vomiting.  Bloating.  Constipation.  Diarrhea.  Abdominal pain.  Difficulty with urination. This occurs if you lose your ability to sense when your bladder is full. Problems include:  Urine leakage (incontinence).  Inability to empty your bladder completely (retention).  Rapid or irregular heartbeat (palpitations).  Blood pressure drops when you stand up (orthostatic hypotension). When you stand up you may feel:  Dizzy.  Weak.  Faint.  In men, inability to attain and maintain an erection.  In women, vaginal dryness and problems with decreased sexual desire and arousal.  Problems with body temperature regulation.  Increased or decreased sweating. Focal Neuropathy  Abnormal eye movements or abnormal alignment of both eyes.  Weakness in the wrist.  Foot drop. This results in an inability to lift the foot properly and abnormal walking or foot movement.  Paralysis on one side of your face (Bell palsy).  Chest or abdominal pain. Radiculoplexus Neuropathy  Sudden, severe pain in your hip, thigh, or buttocks.  Weakness and wasting of thigh muscles.  Difficulty rising from a seated position.  Abdominal swelling.  Unexplained weight loss (usually more than 10 lb [4.5 kg]). DIAGNOSIS  Peripheral Neuropathy Your senses may   be tested. Sensory function testing can be done with:  A light touch using a  monofilament.  A vibration with tuning fork.  A sharp sensation with a pin prick. Other tests that can help diagnose neuropathy are:  Nerve conduction velocity. This test checks the transmission of an electrical current through a nerve.  Electromyography. This shows how muscles respond to electrical signals transmitted by nearby nerves.  Quantitative sensory testing. This is used to assess how your nerves respond to vibrations and changes in temperature. Autonomic Neuropathy Diagnosis is often based on reported symptoms. Tell your health care provider if you experience:   Dizziness.   Constipation.   Diarrhea.   Inappropriate urination or inability to urinate.   Inability to get or maintain an erection.  Tests that may be done include:   Electrocardiography or Holter monitor. These are tests that can help show problems with the heart rate or heart rhythm.   An X-ray exam may be done. Focal Neuropathy Diagnosis is made based on your symptoms and what your health care provider finds during your exam. Other tests may be done. They may include:  Nerve conduction velocities. This checks the transmission of electrical current through a nerve.  Electromyography. This shows how muscles respond to electrical signals transmitted by nearby nerves.  Quantitative sensory testing. This test is used to assess how your nerves respond to vibration and changes in temperature. Radiculoplexus Neuropathy  Often the first thing is to eliminate any other issue or problems that might be the cause, as there is no stick test for diagnosis.  X-ray exam of your spine and lumbar region.  Spinal tap to rule out cancer.  MRI to rule out other lesions. TREATMENT  Once nerve damage occurs, it cannot be reversed. The goal of treatment is to keep the disease or nerve damage from getting worse and affecting more nerve fibers. Controlling your blood glucose level is the key. Most people with  radiculoplexus neuropathy see at least a partial improvement over time. You will need to keep your blood glucose and HbA1c levels in the target range determined by your health care provider. Things that help control blood glucose levels include:   Blood glucose monitoring.   Meal planning.   Physical activity.   Diabetes medicine.  Over time, maintaining lower blood glucose levels helps lessen symptoms. Sometimes, prescription pain medicine is needed. HOME CARE INSTRUCTIONS:  Do not smoke.  Keep your blood glucose level in the range that you and your health care provider have determined acceptable for you.  Keep your blood pressure level in the range that you and your health care provider have determined acceptable for you.  Eat a well-balanced diet.  Be active every day.  Check your feet every day. SEEK MEDICAL CARE IF:   You have burning, stabbing, or aching pain in the legs or feet.  You are unable to feel pressure or pain in your feet.  You develop problems with digestion such as:  Nausea.  Vomiting.  Bloating.  Constipation.  Diarrhea.  Abdominal pain.  You have difficulty with urination, such as:  Incontinence.  Retention.  You have palpitations.  You develop orthostatic hypotension. When you stand up you may feel:  Dizzy.  Weak.  Faint.  You cannot attain and maintain an erection (in men).  You have vaginal dryness and problems with decreased sexual desire and arousal (in women).  You have severe pain in your thighs, legs, or buttocks.  You have unexplained weight loss.   Document Released: 11/16/2001 Document Revised: 06/28/2013 Document Reviewed: 02/16/2013 [facility] Patient Information 2015 [place], [place]. This information is not intended to replace advice given to you by your health care provider. Make sure you discuss any questions you have with your health care provider. Diabetes and Foot Care Diabetes may cause you to have problems  because of poor blood supply (circulation) to your feet and legs. This may cause the skin on your feet to become thinner, break easier, and heal more slowly. Your skin may become dry, and the skin may peel and crack. You may also have nerve damage in your legs and feet causing decreased feeling in them. You may not notice minor injuries to your feet that could lead to infections or more serious problems. Taking care of your feet is one of the most important things you can do for yourself.  HOME CARE INSTRUCTIONS  Wear shoes at all times, even in the house. Do not go barefoot. Bare feet are easily injured.  Check your feet daily for blisters, cuts, and redness. If you cannot see the bottom of your feet, use a mirror or ask someone for help.  Wash your feet with warm water (do not use hot water) and mild soap. Then pat your feet and the areas between your toes until they are completely dry. Do not soak your feet as this can dry your skin.  Apply a moisturizing lotion or petroleum jelly (that does not contain alcohol and is unscented) to the skin on your feet and to dry, brittle toenails. Do not apply lotion between your toes.  Trim your toenails straight across. Do not dig under them or around the cuticle. File the edges of your nails with an emery board or nail file.  Do not cut corns or calluses or try to remove them with medicine.  Wear clean socks or stockings every day. Make sure they are not too tight. Do not wear knee-high stockings since they may decrease blood flow to your legs.  Wear shoes that fit properly and have enough cushioning. To break in new shoes, wear them for just a few hours a day. This prevents you from injuring your feet. Always look in your shoes before you put them on to be sure there are no objects inside.  Do not cross your legs. This may decrease the blood flow to your feet.  If you find a minor scrape, cut, or break in the skin on your feet, keep it and the skin  around it clean and dry. These areas may be cleansed with mild soap and water. Do not cleanse the area with peroxide, alcohol, or iodine.  When you remove an adhesive bandage, be sure not to damage the skin around it.  If you have a wound, look at it several times a day to make sure it is healing.  Do not use heating pads or hot water bottles. They may burn your skin. If you have lost feeling in your feet or legs, you may not know it is happening until it is too late.  Make sure your health care provider performs a complete foot exam at least annually or more often if you have foot problems. Report any cuts, sores, or bruises to your health care provider immediately. SEEK MEDICAL CARE IF:   You have an injury that is not healing.  You have cuts or breaks in the skin.  You have an ingrown nail.  You notice redness on your legs or feet.  You feel burning or tingling in your legs or feet.  You have pain or cramps in your legs and feet.  Your legs or feet are numb.  Your feet always feel cold. SEEK IMMEDIATE MEDICAL CARE IF:   There is increasing redness, swelling, or pain in or around a wound.  There is a red line that goes up your leg.  Pus is coming from a wound.  You develop a fever or as directed by your health care provider.  You notice a bad smell coming from an ulcer or wound. Document Released: 09/04/2000 Document Revised: 05/10/2013 Document Reviewed: 02/14/2013 [facility] Patient Information 2015 [place], [place]. This information is not intended to replace advice given to you by your health care provider. Make sure you discuss any questions you have with your health care provider. Diabetes Mellitus and Food It is important for you to manage your blood sugar (glucose) level. Your blood glucose level can be greatly affected by what you eat. Eating healthier foods in the appropriate amounts throughout the day at about the same time each day will help you control your blood  glucose level. It can also help slow or prevent worsening of your diabetes mellitus. Healthy eating may even help you improve the level of your blood pressure and reach or maintain a healthy weight.  HOW CAN FOOD AFFECT ME? Carbohydrates Carbohydrates affect your blood glucose level more than any other type of food. Your dietitian will help you determine how many carbohydrates to eat at each meal and teach you how to count carbohydrates. Counting carbohydrates is important to keep your blood glucose at a healthy level, especially if you are using insulin or taking certain medicines for diabetes mellitus. Alcohol Alcohol can cause sudden decreases in blood glucose (hypoglycemia), especially if you use insulin or take certain medicines for diabetes mellitus. Hypoglycemia can be a life-threatening condition. Symptoms of hypoglycemia (sleepiness, dizziness, and disorientation) are similar to symptoms of having too much alcohol.  If your health care provider has given you approval to drink alcohol, do so in moderation and use the following guidelines:  Women should not have more than one drink per day, and men should not have more than two drinks per day. One drink is equal to:  12 oz of beer.  5 oz of wine.  1 oz of hard liquor.  Do not drink on an empty stomach.  Keep yourself hydrated. Have water, diet soda, or unsweetened iced tea.  Regular soda, juice, and other mixers might contain a lot of carbohydrates and should be counted. WHAT FOODS ARE NOT RECOMMENDED? As you make food choices, it is important to remember that all foods are not the same. Some foods have fewer nutrients per serving than other foods, even though they might have the same number of calories or carbohydrates. It is difficult to get your body what it needs when you eat foods with fewer nutrients. Examples of foods that you should avoid that are high in calories and carbohydrates but low in nutrients include:  Trans fats  (most processed foods list trans fats on the Nutrition Facts label).  Regular soda.  Juice.  Candy.  Sweets, such as cake, pie, doughnuts, and cookies.  Fried foods. WHAT FOODS CAN I EAT? Have nutrient-rich foods, which will nourish your body and keep you healthy. The food you should eat also will depend on several factors, including:  The calories you need.  The medicines you take.  Your weight.  Your blood glucose level.  Your blood pressure level.  Your cholesterol level. You also should eat a variety of foods, including:  Protein, such as meat, poultry, fish, tofu, nuts, and seeds (lean animal proteins are best).  Fruits.  Vegetables.  Dairy products, such as milk, cheese, and yogurt (low fat is best).  Breads, grains, pasta, cereal, rice, and beans.  Fats such as olive oil, trans fat-free margarine, canola oil, avocado, and olives. DOES EVERYONE WITH DIABETES MELLITUS HAVE THE SAME MEAL PLAN? Because every person with diabetes mellitus is different, there is not one meal plan that works for everyone. It is very important that you meet with a dietitian who will help you create a meal plan that is just right for you. Document Released: 06/04/2005 Document Revised: 09/12/2013 Document Reviewed: 08/04/2013 [facility] Patient Information 2015 [place], [place]. This information is not intended to replace advice given to you by your health care provider. Make sure you discuss any questions you have with your health care provider. Diabetes and Exercise Exercising regularly is important. It is not just about losing weight. It has many health benefits, such as:  Improving your overall fitness, flexibility, and endurance.  Increasing your bone density.  Helping with weight control.  Decreasing your body fat.  Increasing your muscle strength.  Reducing stress and tension.  Improving your overall health. People with diabetes who exercise gain additional benefits because  exercise:  Reduces appetite.  Improves the body's use of blood sugar (glucose).  Helps lower or control blood glucose.  Decreases blood pressure.  Helps control blood lipids (such as cholesterol and triglycerides).  Improves the body's use of the hormone insulin by:  Increasing the body's insulin sensitivity.  Reducing the body's insulin needs.  Decreases the risk for heart disease because exercising:  Lowers cholesterol and triglycerides levels.  Increases the levels of good cholesterol (such as high-density lipoproteins [HDL]) in the body.  Lowers blood glucose levels. YOUR ACTIVITY PLAN  Choose an activity that you enjoy and set realistic goals. Your health care provider or diabetes educator can help you make an activity plan that works for you. Exercise regularly as directed by your health care provider. This includes:  Performing resistance training twice a week such as push-ups, sit-ups, lifting weights, or using resistance bands.  Performing 150 minutes of cardio exercises each week such as walking, running, or playing sports.  Staying active and spending no more than 90 minutes at one time being inactive. Even short bursts of exercise are good for you. Three 10-minute sessions spread throughout the day are just as beneficial as a single 30-minute session. Some exercise ideas include:  Taking the dog for a walk.  Taking the stairs instead of the elevator.  Dancing to your favorite song.  Doing an exercise video.  Doing your favorite exercise with a friend. RECOMMENDATIONS FOR EXERCISING WITH TYPE 1 OR TYPE 2 DIABETES   Check your blood glucose before exercising. If blood glucose levels are greater than 240 mg/dL, check for urine ketones. Do not exercise if ketones are present.  Avoid injecting insulin into areas of the body that are going to be exercised. For example, avoid injecting insulin into:  The arms when playing tennis.  The legs when  jogging.  Keep a record of:  Food intake before and after you exercise.  Expected peak times of insulin action.  Blood glucose levels before and after you exercise.  The type and amount of exercise you have done.  Review your records with your health care  provider. Your health care provider will help you to develop guidelines for adjusting food intake and insulin amounts before and after exercising.  If you take insulin or oral hypoglycemic agents, watch for signs and symptoms of hypoglycemia. They include:  Dizziness.  Shaking.  Sweating.  Chills.  Confusion.  Drink plenty of water while you exercise to prevent dehydration or heat stroke. Body water is lost during exercise and must be replaced.  Talk to your health care provider before starting an exercise program to make sure it is safe for you. Remember, almost any type of activity is better than none. Document Released: 11/28/2003 Document Revised: 01/22/2014 Document Reviewed: 02/14/2013 [facility] Patient Information 2015 [place], [place]. This information is not intended to replace advice given to you by your health care provider. Make sure you discuss any questions you have with your health care provider. Basic Carbohydrate Counting for Diabetes Mellitus Carbohydrate counting is a method for keeping track of the amount of carbohydrates you eat. Eating carbohydrates naturally increases the level of sugar (glucose) in your blood, so it is important for you to know the amount that is okay for you to have in every meal. Carbohydrate counting helps keep the level of glucose in your blood within normal limits. The amount of carbohydrates allowed is different for every person. A dietitian can help you calculate the amount that is right for you. Once you know the amount of carbohydrates you can have, you can count the carbohydrates in the foods you want to eat. Carbohydrates are found in the following foods:  Grains, such as breads  and cereals.  Dried beans and soy products.  Starchy vegetables, such as potatoes, peas, and corn.  Fruit and fruit juices.  Milk and yogurt.  Sweets and snack foods, such as cake, cookies, candy, chips, soft drinks, and fruit drinks. CARBOHYDRATE COUNTING There are two ways to count the carbohydrates in your food. You can use either of the methods or a combination of both. Reading the "Nutrition Facts" on [place] The "Nutrition Facts" is an area that is included on the labels of almost all packaged food and beverages in the [place]. It includes the serving size of that food or beverage and information about the nutrients in each serving of the food, including the grams (g) of carbohydrate per serving.  Decide the number of servings of this food or beverage that you will be able to eat or drink. Multiply that number of servings by the number of grams of carbohydrate that is listed on the label for that serving. The total will be the amount of carbohydrates you will be having when you eat or drink this food or beverage. Learning Standard Serving Sizes of Food When you eat food that is not packaged or does not include "Nutrition Facts" on the label, you need to measure the servings in order to count the amount of carbohydrates.A serving of most carbohydrate-rich foods contains about 15 g of carbohydrates. The following list includes serving sizes of carbohydrate-rich foods that provide 15 g ofcarbohydrate per serving:   1 slice of bread (1 oz) or 1 six-inch tortilla.    of a hamburger bun or English muffin.  4-6 crackers.   cup unsweetened dry cereal.    cup hot cereal.   cup rice or pasta.    cup mashed potatoes or  of a large baked potato.  1 cup fresh fruit or one small piece of fruit.    cup canned or frozen fruit  or fruit juice.  1 cup milk.   cup plain fat-free yogurt or yogurt sweetened with artificial sweeteners.   cup cooked dried beans or  starchy vegetable, such as peas, corn, or potatoes.  Decide the number of standard-size servings that you will eat. Multiply that number of servings by 15 (the grams of carbohydrates in that serving). For example, if you eat 2 cups of strawberries, you will have eaten 2 servings and 30 g of carbohydrates (2 servings x 15 g = 30 g). For foods such as soups and casseroles, in which more than one food is mixed in, you will need to count the carbohydrates in each food that is included. EXAMPLE OF CARBOHYDRATE COUNTING Sample Dinner  3 oz chicken breast.   cup of brown rice.   cup of corn.  1 cup milk.   1 cup strawberries with sugar-free whipped topping.  Carbohydrate Calculation Step 1: Identify the foods that contain carbohydrates:   Rice.   Corn.   Milk.   Strawberries. Step 2:Calculate the number of servings eaten of each:   2 servings of rice.   1 serving of corn.   1 serving of milk.   1 serving of strawberries. Step 3: Multiply each of those number of servings by 15 g:   2 servings of rice x 15 g = 30 g.   1 serving of corn x 15 g = 15 g.   1 serving of milk x 15 g = 15 g.   1 serving of strawberries x 15 g = 15 g. Step 4: Add together all of the amounts to find the total grams of carbohydrates eaten: 30 g + 15 g + 15 g + 15 g = 75 g. Document Released: 09/07/2005 Document Revised: 01/22/2014 Document Reviewed: 08/04/2013 [facility] Patient Information 2015 [place], [place]. This information is not intended to replace advice given to you by your health care provider. Make sure you discuss any questions you have with your health care provider.

## 2015-01-18 ENCOUNTER — Other Ambulatory Visit: Payer: Self-pay | Admitting: Internal Medicine

## 2015-01-18 DIAGNOSIS — E1140 Type 2 diabetes mellitus with diabetic neuropathy, unspecified: Secondary | ICD-10-CM

## 2015-01-18 DIAGNOSIS — E119 Type 2 diabetes mellitus without complications: Secondary | ICD-10-CM

## 2015-01-18 MED ORDER — PREGABALIN 50 MG PO CAPS: 50.0000 mg | ORAL_CAPSULE | Freq: Three times a day (TID) | ORAL | Status: DC

## 2015-02-21 ENCOUNTER — Encounter: Payer: Self-pay | Admitting: Internal Medicine

## 2015-02-21 ENCOUNTER — Ambulatory Visit: Payer: Self-pay | Attending: Internal Medicine | Admitting: Internal Medicine

## 2015-02-21 VITALS — BP 133/82 | HR 93 | Temp 98.5°F | Resp 18 | Ht 66.0 in | Wt 257.0 lb

## 2015-02-21 DIAGNOSIS — E039 Hypothyroidism, unspecified: Secondary | ICD-10-CM | POA: Insufficient documentation

## 2015-02-21 DIAGNOSIS — M545 Low back pain, unspecified: Secondary | ICD-10-CM

## 2015-02-21 DIAGNOSIS — Z79899 Other long term (current) drug therapy: Secondary | ICD-10-CM | POA: Insufficient documentation

## 2015-02-21 DIAGNOSIS — E785 Hyperlipidemia, unspecified: Secondary | ICD-10-CM | POA: Insufficient documentation

## 2015-02-21 DIAGNOSIS — E1140 Type 2 diabetes mellitus with diabetic neuropathy, unspecified: Secondary | ICD-10-CM | POA: Insufficient documentation

## 2015-02-21 DIAGNOSIS — Z23 Encounter for immunization: Secondary | ICD-10-CM

## 2015-02-21 DIAGNOSIS — M542 Cervicalgia: Secondary | ICD-10-CM | POA: Insufficient documentation

## 2015-02-21 DIAGNOSIS — I129 Hypertensive chronic kidney disease with stage 1 through stage 4 chronic kidney disease, or unspecified chronic kidney disease: Secondary | ICD-10-CM | POA: Insufficient documentation

## 2015-02-21 DIAGNOSIS — E1142 Type 2 diabetes mellitus with diabetic polyneuropathy: Secondary | ICD-10-CM

## 2015-02-21 DIAGNOSIS — Z6841 Body Mass Index (BMI) 40.0 and over, adult: Secondary | ICD-10-CM | POA: Insufficient documentation

## 2015-02-21 DIAGNOSIS — I10 Essential (primary) hypertension: Secondary | ICD-10-CM

## 2015-02-21 DIAGNOSIS — E038 Other specified hypothyroidism: Secondary | ICD-10-CM

## 2015-02-21 DIAGNOSIS — N189 Chronic kidney disease, unspecified: Secondary | ICD-10-CM | POA: Insufficient documentation

## 2015-02-21 DIAGNOSIS — Z794 Long term (current) use of insulin: Secondary | ICD-10-CM | POA: Insufficient documentation

## 2015-02-21 DIAGNOSIS — E11649 Type 2 diabetes mellitus with hypoglycemia without coma: Secondary | ICD-10-CM | POA: Insufficient documentation

## 2015-02-21 DIAGNOSIS — F419 Anxiety disorder, unspecified: Secondary | ICD-10-CM | POA: Insufficient documentation

## 2015-02-21 LAB — GLUCOSE, POCT (MANUAL RESULT ENTRY): POC Glucose: 181 mg/dl — AB (ref 70–99)

## 2015-02-21 LAB — POCT GLYCOSYLATED HEMOGLOBIN (HGB A1C): Hemoglobin A1C: 7.8

## 2015-02-21 MED ORDER — DAPAGLIFLOZIN PROPANEDIOL 10 MG PO TABS: 10.0000 mg | ORAL_TABLET | Freq: Every day | ORAL | Status: DC

## 2015-02-21 NOTE — Progress Notes (Signed)
[name], female   DOB: 1951-08-18, 64 y.o.   MRN: [id number]  CC: DM f/u, pain   HPI: Anita James is a 64 y.o. female here today for a follow up visit.  Patient has past medical history of T2DM, HTN, hypothyroidism, HLD, neuropathy, and chronic pain syndrome. She was last seen by Dr. Adrian Blackwater on 01/15/15 and was found to have a elevated A1C. She reports that she has been controlled for several years but when she changed her regimen her levels began to increase. She is now currently taking Lantus 60 units nightly and Novolog 30 units TID. She has noticed a difference in her blood sugar levels since she has increased back to her original insulin dose. She states that she hardly has a appetite so she does not eat 3 meals per day. Had a partial thyroidectomy at 18. Full thyroidectomy over 30 years ago. More problems with fatigue and sleeping more often. She notes that she is unable to get her weight down. She states that she has received the prescription for Lyrica but has not begun to take it. She is wanting to finish all of her gabapentin before switching over.    She also c/o neck pain, neuropathy, and back pain. Colonoscopy less than 10 years ago.   [name], [name], [name], [name], [name]  Allergies  Allergen Reactions  . Amoxicillin-Pot Clavulanate Nausea And Vomiting and Other (See Comments)    Combination of medications tear lining of stomach  . Sulfa Antibiotics Shortness Of Breath   Past Medical History  Diagnosis Date  . Diabetes mellitus type II, [name]   . Frequent headaches   . Chronic back pain   . Chronic kidney disease 03/2013    failure due to meds  . Chest pain   . Obesity   . Carpal tunnel syndrome 06/14/2014  . Lesion of ulnar nerve  06/14/2014  . Vitamin D deficiency 06/05/2014   Current Outpatient Prescriptions on File Prior to Visit  Medication Sig Dispense Refill  . atorvastatin (LIPITOR) 40 MG tablet Take 1 tablet (40 mg total) by mouth daily. 90 tablet 3  . dapagliflozin propanediol (FARXIGA) 10 MG TABS tablet Take 10 mg by mouth daily. 90 tablet 3  . gabapentin (NEURONTIN) 300 MG capsule Take 2 capsules (600 mg total) by mouth 2 (two) times daily. 120 capsule 5  . insulin aspart (NOVOLOG) 100 UNIT/ML FlexPen Inject 30 Units into the skin 3 (three) times daily with meals. 15 mL 5  . insulin glargine (LANTUS) 100 UNIT/ML injection Inject 0.6 mLs (60 Units total) into the skin at bedtime. 6 vial 5  . ipratropium (ATROVENT) 0.06 % nasal spray Place 2 sprays into both nostrils 4 (four) times daily. 15 mL 0  . levothyroxine (SYNTHROID, LEVOTHROID) 100 MCG tablet Take 1 tablet (100 mcg total) by mouth daily before breakfast. 90 tablet 0  . lisinopril (PRINIVIL,ZESTRIL) 40 MG tablet TAKE 1 TABLET BY MOUTH  DAILY 90 tablet 1  . metoprolol succinate (TOPROL-XL) 50 MG 24 hr tablet Take 2 tablets (100 mg total) by mouth daily. Take with or immediately following a meal. 90 tablet 1  . mometasone (NASONEX) 50 MCG/ACT nasal spray Place 2 sprays into  the nose daily. 17 g 5  . Multiple Vitamins-Minerals (EYE VITAMINS) CAPS Take 1 capsule by mouth daily.    . nitroGLYCERIN (NITROSTAT) 0.4 MG SL tablet Place 0.4 mg under the tongue every 5 (five) minutes as needed for chest pain.    . pantoprazole (PROTONIX) 40 MG tablet Take 1 tablet (40 mg total) by mouth 2 (two) times daily. 90 tablet 3  . pregabalin (LYRICA) 50 MG capsule Take 1 capsule (50 mg total) by mouth 3 (three) times daily. 90 capsule 5  . sertraline (ZOLOFT) 50 MG tablet Take 50 mg by mouth 3 (three) times daily as needed.     . traMADol (ULTRAM) 50 MG tablet Take 2 tablets (100 mg total) by mouth every 12 (twelve) hours as needed for severe pain. 120 tablet 1  . fluconazole  (DIFLUCAN) 150 MG tablet Take 1 tablet (150 mg total) by mouth every 3 (three) days. (Patient not taking: Reported on 02/21/2015) 2 tablet 0  . [DISCONTINUED] fenofibrate (TRICOR) 145 MG tablet Take 1 tablet (145 mg total) by mouth daily. 30 tablet 2  . [DISCONTINUED] insulin detemir (LEVEMIR) 100 UNIT/ML injection Inject 60 Units into the skin at bedtime. 10 mL 1  . [DISCONTINUED] metoprolol (LOPRESSOR) 50 MG tablet Take 1 tablet (50 mg total) by mouth 2 (two) times daily. 60 tablet 2   No current facility-administered medications on file prior to visit.   Family History  Problem Relation Age of Onset  . Cancer Mother     cervical- mets  . Cancer Father   . Heart disease Father   . Diabetes Father   . Diabetes Sister   . Heart disease Sister   . Heart disease Sister   . COPD Sister    History   Social History  . Marital Status: Divorced    Spouse Name: N/A  . Number of Children: 6  . Years of Education: BA   Occupational History  . unemployed    Social History Main Topics  . Smoking status: Never Smoker   . Smokeless tobacco: Never Used  . Alcohol Use: No  . Drug Use: No  . Sexual Activity: Not on file   Other Topics Concern  . Not on file   Social History Narrative    Review of Systems: See HPI    Objective:   Filed Vitals:   02/21/15 1433  BP: 133/82  Pulse: 93  Temp: 98.5 F (36.9 C)  Resp: 18    Physical Exam  Constitutional: She is oriented to person, place, and time.  Neck: No JVD present.  Cardiovascular: Normal rate, regular rhythm and normal heart sounds.   Pulmonary/Chest: Effort normal and breath sounds normal.  Musculoskeletal: She exhibits no edema or tenderness.  Neurological: She is alert and oriented to person, place, and time.  Skin: Skin is warm and dry.     Lab Results  Component Value Date   WBC 5.3 12/23/2013   HGB 14.2 12/23/2013   HCT 40.4 12/23/2013   MCV 87.1 12/23/2013   PLT 189 12/23/2013   Lab Results  Component  Value Date   CREATININE 0.70 12/23/2013   BUN 10 12/23/2013   NA 144 12/23/2013   K 4.1 12/23/2013   CL 104 12/23/2013   CO2 27 12/23/2013    Lab Results  Component Value Date   HGBA1C 7.80 02/21/2015   Lipid Panel     Component Value Date/Time   CHOL 190 11/12/2014 1142   TRIG 235* 11/12/2014 1142  HDL 40* 11/12/2014 1142   CHOLHDL 4.8 11/12/2014 1142   VLDL 47* 11/12/2014 1142   LDLCALC 103* 11/12/2014 1142       Assessment and plan:   Tamiah was seen today for diabetes.  Diagnoses and all orders for this visit:  Type 2 diabetes, controlled, with neuropathy Orders: -     Glucose (CBG) -     HgB A1c -     Tdap vaccine greater than or equal to 7yo IM -     Refill dapagliflozin propanediol (FARXIGA) 10 MG TABS tablet; Take 10 mg by mouth daily. She notes that she has had 2 episodes of hypoglycemia. She will bring her sugar log back on the next exam and if she continues to have low sugars I will decrease dose or farxiga.  A1C is trending back down.   Essential hypertension Patient blood pressure is stable and may continue on current medication.  Education on diet, exercise, and modifiable risk factors discussed. Will obtain appropriate labs as needed. Will follow up in 3-6 months.  Continue same regimen   Diabetic polyneuropathy associated with type 2 diabetes mellitus I have stressed to patient to not take both lyrica and gabapentin together. I have went over the dangers of sedation and CNS depression with patient. She verbalizes understanding.  Morbid obesity Weight loss and exercise discussed in detail.   Other specified hypothyroidism I will recheck levels in September.   Neck pain May continue gabapentin and tramadol as needed.   Midline low back pain without sciatica See above. Weight loss stressed to help with back pain and pressure on joints   May f/u in September for DM/HTN/thyroid        Chari Manning, [facility] [phone] 02/21/2015, 2:46 PM

## 2015-02-21 NOTE — Progress Notes (Signed)
Patient here for diabetes. Patient reports blood glucose being better controlled with insulin. Patient reports pain in left shoulder, radiating towards neck, at level 10, described as throbbing. Patient reports lower back pain, at level 9, described as discomfort.

## 2015-02-21 NOTE — Patient Instructions (Addendum)
One of the side effects of your blood pressure medication metoprolol is drowsiness.   If you continue to notice low blood sugars call me back and we may adjust the farxiga.   I will recheck thyroid levels in September

## 2015-02-25 ENCOUNTER — Telehealth: Payer: Self-pay | Admitting: Clinical

## 2015-02-25 NOTE — Telephone Encounter (Signed)
Completed.

## 2015-03-19 ENCOUNTER — Other Ambulatory Visit: Payer: Self-pay | Admitting: Internal Medicine

## 2015-03-19 MED ORDER — DEXLANSOPRAZOLE 30 MG PO CPDR: 30.0000 mg | DELAYED_RELEASE_CAPSULE | Freq: Every day | ORAL | Status: DC

## 2015-03-29 ENCOUNTER — Ambulatory Visit: Payer: Self-pay | Attending: Internal Medicine

## 2015-03-29 DIAGNOSIS — E1165 Type 2 diabetes mellitus with hyperglycemia: Secondary | ICD-10-CM

## 2015-03-29 LAB — POCT GLYCOSYLATED HEMOGLOBIN (HGB A1C): Hemoglobin A1C: 6.3

## 2015-03-29 NOTE — Progress Notes (Unsigned)
Patient had appt for lab, however, no lab orders in Epic Patient states she wants to know what her A1c level is 3 months after restarting insulin  Order for A1c placed

## 2015-04-02 ENCOUNTER — Ambulatory Visit: Payer: Self-pay

## 2015-04-02 ENCOUNTER — Ambulatory Visit: Payer: Self-pay | Attending: Internal Medicine | Admitting: Physician Assistant

## 2015-04-02 VITALS — BP 125/79 | HR 96 | Temp 98.2°F | Resp 18 | Ht 66.0 in | Wt 265.6 lb

## 2015-04-02 DIAGNOSIS — J069 Acute upper respiratory infection, unspecified: Secondary | ICD-10-CM

## 2015-04-02 MED ORDER — AZITHROMYCIN 250 MG PO TABS: ORAL_TABLET | ORAL | Status: DC

## 2015-04-02 MED ORDER — GUAIFENESIN-CODEINE 100-10 MG/5ML PO SOLN: 5.0000 mL | Freq: Four times a day (QID) | ORAL | Status: DC | PRN

## 2015-04-02 MED ORDER — PREDNISONE 20 MG PO TABS
20.0000 mg | ORAL_TABLET | Freq: Every day | ORAL | Status: DC
Start: 2015-04-02 — End: 2015-05-15

## 2015-04-02 NOTE — Progress Notes (Signed)
Chief Complaint: cough  Subjective: 64 year old female presenting with 3 weeks of cough. She's had green mucus production. Now she's getting the point where she coughed so much that she chokes. She's had some sweating. She denies chills or fevers. She's been hoarse over the last several nights. She's also had a frontal headache as well as temporal headache. She admits to some shortness of breath. Some wheezing especially at night. She denies chest pain.  ROS: GEN: denies fever or chills, denies change in weight HEENT: + headache, earache, epistaxis, sore throat, no neck pain LUNGS: some SHOB, dyspnea, PND, orthopnea CV: denies CP or palpitations   Objective:  Filed Vitals:   04/02/15 1421 04/02/15 1424  BP:  125/79  Pulse:  96  Temp:  98.2 F (36.8 C)  TempSrc:  Oral  Resp:  18  Height: 5\' 6"  (1.676 m)   Weight: 265 lb 9.6 oz (120.475 kg)   SpO2:  94%    Physical Exam:  General: in no acute distress. HEENT: tender over ethmoid sinuses; no pallor, no icterus, moist oral mucosa, red; no JVD, no lymphadenopathy; left ear red Heart: Normal  s1 &s2  Regular rate and rhythm, without murmurs, rubs, gallops. Lungs: scant exp wheezes.    Medications: Prior to Admission medications   Medication Sig Start Date End Date Taking? Authorizing Provider  atorvastatin (LIPITOR) 40 MG tablet Take 1 tablet (40 mg total) by mouth daily. 11/13/14  Yes Josalyn Funches, MD  dapagliflozin propanediol (FARXIGA) 10 MG TABS tablet Take 10 mg by mouth daily. 02/21/15  Yes Lance Bosch, NP  gabapentin (NEURONTIN) 300 MG capsule Take 2 capsules (600 mg total) by mouth 2 (two) times daily. 01/15/15  Yes Josalyn Funches, MD  insulin aspart (NOVOLOG) 100 UNIT/ML FlexPen Inject 30 Units into the skin 3 (three) times daily with meals. 01/15/15  Yes Josalyn Funches, MD  insulin glargine (LANTUS) 100 UNIT/ML injection Inject 0.6 mLs (60 Units total) into the skin at bedtime. 01/15/15  Yes Josalyn Funches, MD   ipratropium (ATROVENT) 0.06 % nasal spray Place 2 sprays into both nostrils 4 (four) times daily. 08/26/14  Yes Lutricia Feil, PA  levothyroxine (SYNTHROID, LEVOTHROID) 100 MCG tablet Take 1 tablet (100 mcg total) by mouth daily before breakfast. 01/17/15  Yes Josalyn Funches, MD  lisinopril (PRINIVIL,ZESTRIL) 40 MG tablet TAKE 1 TABLET BY MOUTH  DAILY 01/10/15  Yes Tresa Garter, MD  metoprolol succinate (TOPROL-XL) 50 MG 24 hr tablet Take 2 tablets (100 mg total) by mouth daily. Take with or immediately following a meal. 11/12/14  Yes Josalyn Funches, MD  mometasone (NASONEX) 50 MCG/ACT nasal spray Place 2 sprays into the nose daily. 01/02/15  Yes Josalyn Funches, MD  Multiple Vitamins-Minerals (EYE VITAMINS) CAPS Take 1 capsule by mouth daily. 08/11/12  Yes Thurnell Lose, MD  nitroGLYCERIN (NITROSTAT) 0.4 MG SL tablet Place 0.4 mg under the tongue every 5 (five) minutes as needed for chest pain. 10/06/12  Yes Thurnell Lose, MD  pregabalin (LYRICA) 50 MG capsule Take 1 capsule (50 mg total) by mouth 3 (three) times daily. 01/18/15  Yes Tresa Garter, MD  sertraline (ZOLOFT) 50 MG tablet Take 50 mg by mouth 3 (three) times daily as needed.    Yes Historical Provider, MD  traMADol (ULTRAM) 50 MG tablet Take 2 tablets (100 mg total) by mouth every 12 (twelve) hours as needed for severe pain. 11/12/14  Yes Josalyn Funches, MD  azithromycin (ZITHROMAX) 250 MG tablet 2 tabs  today, 1 tab orally daily for 4 days thereafter 04/02/15   Brayton Caves, PA-C  Dexlansoprazole (DEXILANT) 30 MG capsule Take 1 capsule (30 mg total) by mouth daily. Patient not taking: Reported on 04/02/2015 03/19/15   Lance Bosch, NP  fluconazole (DIFLUCAN) 150 MG tablet Take 1 tablet (150 mg total) by mouth every 3 (three) days. Patient not taking: Reported on 02/21/2015 01/15/15   Boykin Nearing, MD  guaiFENesin-codeine 100-10 MG/5ML syrup Take 5 mLs by mouth every 6 (six) hours as needed for cough. 04/02/15    Demarcus Thielke Daneil Dan, PA-C  predniSONE (DELTASONE) 20 MG tablet Take 1 tablet (20 mg total) by mouth daily with breakfast. 04/02/15   Brayton Caves, PA-C    Assessment: Acute upper respiratory infection   Plan: Z-Pak Steroids Guaifenesin/Codeine syrup 4 week follow-up with Chari Manning, nurse practitioner  Follow up:4 weeks or sooner if needed  The patient was given clear instructions to go to ER or return to medical center if symptoms don't improve, worsen or new problems develop. The patient verbalized understanding. The patient was told to call to get lab results if they haven't heard anything in the next week.   This note has been created with Surveyor, quantity. Any transcriptional errors are unintentional.   Zettie Pho, PA-C 04/02/2015, 3:51 PM

## 2015-04-02 NOTE — Progress Notes (Signed)
Patient reports she has been coughing constantly for three weeks. Patient reports last night the mucus almost choked her. Patient reports SOB and constant head ache. Pain located in throat and middle of chest. Pain rated at a 10 and is constant. Patient unable to describe the pain.  Cough described as "hacky". Patient reports she can hear wheezing in her chest at night. Patient has been taking some medications to try and help it but it has not helped.   Patient needs refill on tramadol.

## 2015-04-08 ENCOUNTER — Other Ambulatory Visit: Payer: Self-pay | Admitting: Family Medicine

## 2015-04-09 ENCOUNTER — Other Ambulatory Visit: Payer: Self-pay | Admitting: Internal Medicine

## 2015-04-24 ENCOUNTER — Telehealth: Payer: Self-pay | Admitting: Internal Medicine

## 2015-04-24 NOTE — Telephone Encounter (Signed)
Patient called to request a med refill for her Thyroid medication, patient uses the [facility] Department for this medication. Please f/u

## 2015-04-26 ENCOUNTER — Encounter: Payer: Self-pay | Admitting: *Deleted

## 2015-04-26 ENCOUNTER — Telehealth: Payer: Self-pay

## 2015-04-26 DIAGNOSIS — E038 Other specified hypothyroidism: Secondary | ICD-10-CM

## 2015-04-26 MED ORDER — LEVOTHYROXINE SODIUM 100 MCG PO TABS: 100.0000 ug | ORAL_TABLET | Freq: Every day | ORAL | Status: DC

## 2015-04-26 NOTE — Telephone Encounter (Signed)
Dawn from health department called requesting a refill on patients Synthroid Medication sent to health dept

## 2015-04-30 ENCOUNTER — Telehealth: Payer: Self-pay | Admitting: Internal Medicine

## 2015-04-30 NOTE — Telephone Encounter (Signed)
Refill: insulin aspart (NOVOLOG) 100 UNIT/ML FlexPen

## 2015-05-01 ENCOUNTER — Ambulatory Visit: Payer: Self-pay | Admitting: Internal Medicine

## 2015-05-13 ENCOUNTER — Telehealth: Payer: Self-pay

## 2015-05-13 DIAGNOSIS — E1140 Type 2 diabetes mellitus with diabetic neuropathy, unspecified: Secondary | ICD-10-CM

## 2015-05-13 DIAGNOSIS — E119 Type 2 diabetes mellitus without complications: Secondary | ICD-10-CM

## 2015-05-13 MED ORDER — INSULIN ASPART 100 UNIT/ML FLEXPEN: 30.0000 [IU] | PEN_INJECTOR | Freq: Three times a day (TID) | SUBCUTANEOUS | Status: DC

## 2015-05-13 NOTE — Telephone Encounter (Signed)
Spoke with dawn at the health department Requesting a refill on novolog for this patient Prescription for refill sent to health department

## 2015-05-15 ENCOUNTER — Ambulatory Visit: Payer: Worker's Compensation

## 2015-05-15 ENCOUNTER — Ambulatory Visit (INDEPENDENT_AMBULATORY_CARE_PROVIDER_SITE_OTHER): Payer: Worker's Compensation | Admitting: Family Medicine

## 2015-05-15 VITALS — BP 126/80 | HR 98 | Temp 98.7°F | Resp 17 | Ht 65.5 in | Wt 270.0 lb

## 2015-05-15 DIAGNOSIS — M79661 Pain in right lower leg: Secondary | ICD-10-CM

## 2015-05-15 DIAGNOSIS — S20212A Contusion of left front wall of thorax, initial encounter: Secondary | ICD-10-CM | POA: Diagnosis not present

## 2015-05-15 DIAGNOSIS — S0093XA Contusion of unspecified part of head, initial encounter: Secondary | ICD-10-CM

## 2015-05-15 NOTE — Patient Instructions (Signed)
I suspect you have a rib contusion or bruise in the rib. See information below. He can take ibuprofen or Aleve over-the-counter. Ultram that you have at home only if needed for more severe pain. Do not drive or operate heavy machinery if you are taking Ultram.  Using the knee immobilizer, and crutches to keep weight off of your right leg unless it is not painful to put weight on it. Follow-up with me in 3 days for further evaluation. If the radiologist does see a broken bone or fracture, we will let you know.  See precautions below regarding head injuries, although at this point without a headache, I do not think you have a concussion.Return to the clinic or go to the nearest emergency room if any of your symptoms worsen or new symptoms occur.   Rib Contusion A rib contusion (bruise) can occur by a blow to the chest or by a fall against a hard object. Usually these will be much better in a couple weeks. If X-rays were taken today and there are no broken bones (fractures), the diagnosis of bruising is made. However, broken ribs may not show up for several days, or may be discovered later on a routine X-ray when signs of healing show up. If this happens to you, it does not mean that something was missed on the X-ray, but simply that it did not show up on the first X-rays. Earlier diagnosis will not usually change the treatment. HOME CARE INSTRUCTIONS   Avoid strenuous activity. Be careful during activities and avoid bumping the injured ribs. Activities that pull on the injured ribs and cause pain should be avoided, if possible.  For the first day or two, an ice pack used every 20 minutes while awake may be helpful. Put ice in a plastic bag and put a towel between the bag and the skin.  Eat a normal, well-balanced diet. Drink plenty of fluids to avoid constipation.  Take deep breaths several times a day to keep lungs free of infection. Try to cough several times a day. Splint the injured area with a  pillow while coughing to ease pain. Coughing can help prevent pneumonia.  Wear a rib belt or binder only if told to do so by your caregiver. If you are wearing a rib belt or binder, you must do the breathing exercises as directed by your caregiver. If not used properly, rib belts or binders restrict breathing which can lead to pneumonia.  Only take over-the-counter or prescription medicines for pain, discomfort, or fever as directed by your caregiver. SEEK MEDICAL CARE IF:   You or your child has an oral temperature above 102 F (38.9 C).  Your baby is older than 3 months with a rectal temperature of 100.5 F (38.1 C) or higher for more than 1 day.  You develop a cough, with thick or bloody sputum. SEEK IMMEDIATE MEDICAL CARE IF:   You have difficulty breathing.  You feel sick to your stomach (nausea), have vomiting or belly (abdominal) pain.  You have worsening pain, not controlled with medications, or there is a change in the location of the pain.  You develop sweating or radiation of the pain into the arms, jaw or shoulders, or become light headed or faint.  You or your child has an oral temperature above 102 F (38.9 C), not controlled by medicine.  Your or your baby is older than 3 months with a rectal temperature of 102 F (38.9 C) or higher.  Your baby  is 40 months old or younger with a rectal temperature of 100.4 F (38 C) or higher. MAKE SURE YOU:   Understand these instructions.  Will watch your condition.  Will get help right away if you are not doing well or get worse. Document Released: 06/02/2001 Document Revised: 01/02/2013 Document Reviewed: 04/25/2008 [facility] Patient Information 2015 [place], [place]. This information is not intended to replace advice given to you by your health care provider. Make sure you discuss any questions you have with your health care provider.  HEAD INJURY If any of the following occur notify your physician or go to the Hospital  Emergency Department: . Increased drowsiness, stupor or loss of consciousness . Restlessness or convulsions (fits) . Paralysis in arms or legs . Temperature above 100 F . Vomiting . Severe headache . Blood or clear fluid dripping from the nose or ears . Stiffness of the neck . Dizziness or blurred vision . Pulsating pain in the eye . Unequal pupils of eye . Personality changes . Any other unusual symptoms PRECAUTIONS . Keep head elevated at all times for the first 24 hours (Elevate mattress if pillow is ineffective) . Do not take tranquilizers, sedatives, narcotics or alcohol . Avoid aspirin. Use only acetaminophen (e.g. Tylenol) or ibuprofen (e.g. Advil) for relief of pain. Follow directions on the bottle for dosage. . Use ice packs for comfort  MEDICATIONS Use medications only as directed by your physician

## 2015-05-15 NOTE — Progress Notes (Signed)
Subjective:  This chart was scribed for [name], MD by Thea Alken, ED Scribe. This patient was seen in room 8 and the patient's care was started at 3:21 PM.   [name], female    DOB: 1951-02-04, 64 y.o.   MRN: [id number]  HPI   Chief Complaint  Patient presents with  . Leg Pain  . Chest Pain    rib pain    HPI Comments: Anita James is a 64 y.o. female who presents to the [facility] For an injury that occurred at work at 83 am. 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  Pt states she was getting out of a [employer] that had just been washed, stepping onto a wet running board of the vehicle with her left foot when she slipped an fell to her left hitting her neck and back on a running board of another vehicle and twisted her right leg during fall. Pt had some assistance getting up after the fall and was able to bear weight to stand on her own. She was able to drive a couple more vehicles at work but pain worsened and beginning to limp from right pain. She now presents with right upper back pain, abrasion on left elbow and left lower leg but no pain and right leg pain. She took Motrin and Tylenol for the pain. She denies having HA, nausea emesis, weakness,   Allergies  Allergen Reactions  . Amoxicillin-Pot Clavulanate Nausea And Vomiting and Other (See Comments)    Combination of medications tear lining of stomach  . Sulfa Antibiotics Shortness Of Breath   Prior to Admission medications   Medication Sig Start Date End Date Taking? Authorizing Provider  atorvastatin (LIPITOR) 40 MG tablet Take 1 tablet (40 mg total) by mouth daily. 11/13/14  Yes Josalyn Funches, MD  dapagliflozin propanediol (FARXIGA) 10 MG TABS tablet Take 10 mg by mouth daily. 02/21/15  Yes Lance Bosch, NP  Dexlansoprazole (DEXILANT) 30 MG capsule Take 1 capsule (30 mg total) by mouth daily. 03/19/15  Yes Lance Bosch, NP  gabapentin (NEURONTIN) 300 MG capsule Take 2 capsules  (600 mg total) by mouth 2 (two) times daily. 01/15/15  Yes Josalyn Funches, MD  insulin aspart (NOVOLOG) 100 UNIT/ML FlexPen Inject 30 Units into the skin 3 (three) times daily with meals. 05/13/15  Yes Lance Bosch, NP  insulin glargine (LANTUS) 100 UNIT/ML injection Inject 0.6 mLs (60 Units total) into the skin at bedtime. 01/15/15  Yes Josalyn Funches, MD  ipratropium (ATROVENT) 0.06 % nasal spray Place 2 sprays into both nostrils 4 (four) times daily. 08/26/14  Yes Lutricia Feil, PA  levothyroxine (SYNTHROID, LEVOTHROID) 100 MCG tablet Take 1 tablet (100 mcg total) by mouth daily before breakfast. 04/26/15  Yes Deepak Advani, MD  lisinopril (PRINIVIL,ZESTRIL) 40 MG tablet TAKE 1 TABLET BY MOUTH  DAILY 04/09/15  Yes Tresa Garter, MD  metoprolol succinate (TOPROL-XL) 50 MG 24 hr tablet TAKE 2 TABLETS BY MOUTH DAILY WITH OR IMMEDIATELY FOLLOWING A MEAL 04/09/15  Yes Josalyn Funches, MD  mometasone (NASONEX) 50 MCG/ACT nasal spray Place 2 sprays into the nose daily. 01/02/15  Yes Josalyn Funches, MD  Multiple Vitamins-Minerals (EYE VITAMINS) CAPS Take 1 capsule by mouth daily. 08/11/12  Yes Thurnell Lose, MD  nitroGLYCERIN (NITROSTAT) 0.4 MG SL tablet Place 0.4 mg under the tongue every 5 (five) minutes as needed for chest pain. 10/06/12  Yes Thurnell Lose, MD  pregabalin (LYRICA) 50 MG capsule Take 1 capsule (50 mg total) by mouth 3 (three) times daily. 01/18/15  Yes Tresa Garter, MD  sertraline (ZOLOFT) 50 MG tablet Take 50 mg by mouth 3 (three) times daily as needed.    Yes Historical Provider, MD  traMADol (ULTRAM) 50 MG tablet Take 2 tablets (100 mg total) by mouth every 12 (twelve) hours as needed for severe pain. 11/12/14  Yes Boykin Nearing, MD   Review of Systems  Musculoskeletal: Positive for back pain and arthralgias. Negative for neck pain.  Skin: Positive for wound.  Neurological: Negative for headaches.   Objective:   Physical Exam  Constitutional: She is  oriented to person, place, and time. She appears well-developed and well-nourished. No distress.  HENT:  Head: Normocephalic and atraumatic.  Eyes: Conjunctivae and EOM are normal.  Neck: Neck supple.  Cardiovascular: Normal rate.   Pulmonary/Chest: Effort normal.  Tender along posterior chest wall below scapula. No midline bony tenderness. No subq emphysema   Musculoskeletal: Normal range of motion.  Right knee no bony tenderness. no pain with movement. Tibia anteriorly non tender. Tenderness to lateral 3rd up to proximal fibula and fibular head, tender along perineal musculature. Gastrocnemius non tender. Soft tissue swelling with faint ecchymosis on right lateral leg. Ankle non tender, pain free ROM. NVI distally DP 2+.  Left lower leg has a superficial abrasion. Left knee non tender cspine- non tender full ROM. para spinal muscle nontender. Scalp nontender. Skin intact no wounds  Neurological: She is alert and oriented to person, place, and time.  Skin: Skin is warm and dry.  Psychiatric: She has a normal mood and affect. Her behavior is normal.  Nursing note and vitals reviewed.  [facility] reading (PRIMARY) by Dr. Carlota Raspberry   right lower leg Xray- Questionable irregularity to fibular head on later view. No other discrete fracture noted. Left rib-No apparent rib fracture . no apparent PTX    Assessment & Plan:  Anita James is a 64 y.o. female here with injuries at work.   Rib contusion, left, initial encounter - Plan: DG Ribs Unilateral W/Chest Left  -no fx seen. No PTX. Sx care discussed - has ultram at home if needed, SED, RTC precautions.   Pain of right lower leg - Plan: DG Tibia/Fibula Right  Strain of peroneals vs less likely proximal fibular fx. nvi distally.   -crutches, knee immobilizer, recheck in 3 days. Weightbearing restrictions discussed.   Contusion of head, initial encounter  -no sign of concussion on initial exam/hx, but head injury precautions discussed.  No orders of  the defined types were placed in this encounter.   Patient Instructions  I suspect you have a rib contusion or bruise in the rib. See information below. He can take ibuprofen or Aleve over-the-counter. Ultram that you have at home only if needed for more severe pain. Do not drive or operate heavy machinery if you are taking Ultram.  Using the knee immobilizer, and crutches to keep weight off of your right leg unless it is not painful to put weight on it. Follow-up with me in 3 days for further evaluation. If the radiologist does see a broken bone or fracture, we will let you know.  See precautions below regarding head injuries, although at this point without a headache, I do not think you have a concussion.Return to the clinic or go to the nearest emergency room if any of your symptoms worsen or new symptoms occur.   Rib Contusion A rib  contusion (bruise) can occur by a blow to the chest or by a fall against a hard object. Usually these will be much better in a couple weeks. If X-rays were taken today and there are no broken bones (fractures), the diagnosis of bruising is made. However, broken ribs may not show up for several days, or may be discovered later on a routine X-ray when signs of healing show up. If this happens to you, it does not mean that something was missed on the X-ray, but simply that it did not show up on the first X-rays. Earlier diagnosis will not usually change the treatment. HOME CARE INSTRUCTIONS   Avoid strenuous activity. Be careful during activities and avoid bumping the injured ribs. Activities that pull on the injured ribs and cause pain should be avoided, if possible.  For the first day or two, an ice pack used every 20 minutes while awake may be helpful. Put ice in a plastic bag and put a towel between the bag and the skin.  Eat a normal, well-balanced diet. Drink plenty of fluids to avoid constipation.  Take deep breaths several times a day to keep lungs free of  infection. Try to cough several times a day. Splint the injured area with a pillow while coughing to ease pain. Coughing can help prevent pneumonia.  Wear a rib belt or binder only if told to do so by your caregiver. If you are wearing a rib belt or binder, you must do the breathing exercises as directed by your caregiver. If not used properly, rib belts or binders restrict breathing which can lead to pneumonia.  Only take over-the-counter or prescription medicines for pain, discomfort, or fever as directed by your caregiver. SEEK MEDICAL CARE IF:   You or your child has an oral temperature above 102 F (38.9 C).  Your baby is older than 3 months with a rectal temperature of 100.5 F (38.1 C) or higher for more than 1 day.  You develop a cough, with thick or bloody sputum. SEEK IMMEDIATE MEDICAL CARE IF:   You have difficulty breathing.  You feel sick to your stomach (nausea), have vomiting or belly (abdominal) pain.  You have worsening pain, not controlled with medications, or there is a change in the location of the pain.  You develop sweating or radiation of the pain into the arms, jaw or shoulders, or become light headed or faint.  You or your child has an oral temperature above 102 F (38.9 C), not controlled by medicine.  Your or your baby is older than 3 months with a rectal temperature of 102 F (38.9 C) or higher.  Your baby is 81 months old or younger with a rectal temperature of 100.4 F (38 C) or higher. MAKE SURE YOU:   Understand these instructions.  Will watch your condition.  Will get help right away if you are not doing well or get worse. Document Released: 06/02/2001 Document Revised: 01/02/2013 Document Reviewed: 04/25/2008 [facility] Patient Information 2015 [place], [place]. This information is not intended to replace advice given to you by your health care provider. Make sure you discuss any questions you have with your health care provider.  HEAD  INJURY If any of the following occur notify your physician or go to the Hospital Emergency Department: . Increased drowsiness, stupor or loss of consciousness . Restlessness or convulsions (fits) . Paralysis in arms or legs . Temperature above 100 F . Vomiting . Severe headache . Blood or clear fluid dripping  from the nose or ears . Stiffness of the neck . Dizziness or blurred vision . Pulsating pain in the eye . Unequal pupils of eye . Personality changes . Any other unusual symptoms PRECAUTIONS . Keep head elevated at all times for the first 24 hours (Elevate mattress if pillow is ineffective) . Do not take tranquilizers, sedatives, narcotics or alcohol . Avoid aspirin. Use only acetaminophen (e.g. Tylenol) or ibuprofen (e.g. Advil) for relief of pain. Follow directions on the bottle for dosage. . Use ice packs for comfort  MEDICATIONS Use medications only as directed by your physician       I personally performed the services described in this documentation, which was scribed in my presence. The recorded information has been reviewed and considered, and addended by me as needed.

## 2015-05-17 ENCOUNTER — Ambulatory Visit (INDEPENDENT_AMBULATORY_CARE_PROVIDER_SITE_OTHER): Payer: Worker's Compensation | Admitting: Family Medicine

## 2015-05-17 VITALS — BP 126/64 | HR 86 | Temp 98.3°F | Resp 18 | Ht 65.5 in | Wt 270.0 lb

## 2015-05-17 DIAGNOSIS — T148XXA Other injury of unspecified body region, initial encounter: Secondary | ICD-10-CM

## 2015-05-17 DIAGNOSIS — M25512 Pain in left shoulder: Secondary | ICD-10-CM

## 2015-05-17 DIAGNOSIS — T148 Other injury of unspecified body region: Secondary | ICD-10-CM | POA: Diagnosis not present

## 2015-05-17 DIAGNOSIS — S0093XA Contusion of unspecified part of head, initial encounter: Secondary | ICD-10-CM | POA: Diagnosis not present

## 2015-05-17 DIAGNOSIS — M25561 Pain in right knee: Secondary | ICD-10-CM

## 2015-05-17 DIAGNOSIS — R0781 Pleurodynia: Secondary | ICD-10-CM | POA: Diagnosis not present

## 2015-05-17 MED ORDER — HYDROCODONE-ACETAMINOPHEN 5-325 MG PO TABS: 1.0000 | ORAL_TABLET | Freq: Three times a day (TID) | ORAL | Status: DC | PRN

## 2015-05-17 MED ORDER — MELOXICAM 7.5 MG PO TABS: 7.5000 mg | ORAL_TABLET | Freq: Two times a day (BID) | ORAL | Status: DC | PRN

## 2015-05-17 NOTE — Progress Notes (Addendum)
Chief Complaint:  Chief Complaint  [name]    rib and head contusion; still in pain  . Leg Pain    states her leg is worse since she has to put weight on her leg/foot    HPI: Anita James is a 64 y.o. female who reports to [facility] today complaining of recheck on her rib contusion, her shoulder pain and also her knee and lower leg pain. She states that she has been wearing the immobilzer and it is hurting her and causing her problems when she has to move and use the bathroom. She also feels like her pain is not adequately controlled. She still has 9-10/10 pain. She was suppose to come inlater but because she hurts so much she cannot tolerate it. She was already on tramadol so that was what she has been using. She was not prescribed anything except was told to be nonweighbearing and also use the crutches nad knee immobilizer. She wants to bend her leg. Deneis CP or SOB   IMPRESSION: 1. No evidence of fracture or dislocation. 2. Chondrocalcinosis of the knee joint.   Electronically Signed  By: Suzy Bouchard M.D.  On: 05/15/2015 17:38  CLINICAL DATA: Fall at work today. Left posterior chest pain and rib contusion. Initial encounter.  EXAM: LEFT RIBS AND CHEST - 3+ VIEW  COMPARISON: 09/19/2014  FINDINGS: No fracture or other bone lesions are seen involving the ribs. There is no evidence of pneumothorax or pleural effusion. Both lungs are clear. Heart size and mediastinal contours are within normal limits.  IMPRESSION: Negative.   Electronically Signed  By: Earle Gell M.D.  On: 05/15/2015 17:39  LAst OV with Dr Carlota Raspberry  Anita James is a 64 y.o. female who presents to the [facility] For an injury that occurred at work at 59 am. 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 Pt states she was getting out of a [employer] that had just been washed, stepping onto a wet running board of the vehicle with her left foot  when she slipped an fell to her left hitting her neck and back on a running board of another vehicle and twisted her right leg during fall. Pt had some assistance getting up after the fall and was able to bear weight to stand on her own. She was able to drive a couple more vehicles at work but pain worsened and beginning to limp from right pain. She now presents with right upper back pain, abrasion on left elbow and left lower leg but no pain and right leg pain. She took Motrin and Tylenol for the pain. She denies having HA, nausea emesis, weakness,    Allergies  Allergen Reactions  . Amoxicillin-Pot Clavulanate Nausea And Vomiting and Other (See Comments)    Combination of medications tear lining of stomach  . Sulfa Antibiotics Shortness Of Breath      ROS: The patient denies fevers, chills, night sweats, unintentional weight loss, chest pain, palpitations, wheezing, dyspnea on exertion, nausea, vomiting, abdominal pain, dysuria, hematuria, melena, numbness, weakness, or tingling.   All other systems have been reviewed and were otherwise negative with the exception of those mentioned in the HPI and as above.    PHYSICAL EXAM: Filed Vitals:   05/17/15 1820  BP: 126/64  Pulse: 86  Temp: 98.3 F (36.8 C)  Resp: 18   Body mass index is 44.23 kg/(m^2).   General: Alert, no acute distress HEENT:  Normocephalic, atraumatic, oropharynx patent. EOMI, PERRLA Cardiovascular:  Regular rate and rhythm, no rubs murmurs or gallops.  No Carotid bruits, radial pulse intact. No pedal edema.  Respiratory: Clear to auscultation bilaterally.  No wheezes, rales, or rhonchi.  No cyanosis, no use of accessory musculature Abdominal: No organomegaly, abdomen is soft and non-tender, positive bowel sounds. No masses. Skin: No rashes. Neurologic: Facial musculature symmetric. Psychiatric: Patient acts appropriately throughout our interaction. Lymphatic: No cervical or submandibular  lymphadenopathy Musculoskeletal: Using crutches Neck exam normal Left shoulder-ppor exam due to pain, she ahs about 30 degrees of flexion and cannot really move it beyond that in other directions, she states it hurts too much, full grip strength sesnation intact Right knee-minimally tender diffusely, full ROM in extensiona nd flexion Tib fib-tender laterally along proximal tib fib, no calf swelling, neg Homan's    LABS:   EKG/XRAY:   Primary read interpreted by Dr. Marin Comment at [facility].   ASSESSMENT/PLAN: Encounter Diagnoses  Name Primary?  . Right knee pain Yes  . Left shoulder pain   . Contusion   . Rib pain   . Contusion of head, initial encounter   . Sprain and strain    I have changed her out of the knee immobilizer since she is complaining that it limits and hurts her, she was given a hinge knee brace but I am not sure she likes it either but will see if it helps with keepingkeen stable, as not able to do a great knee exa since she kept complaing of pain, I worry about any ligamentous laxity or injury based on the description of how the injury occurre, at least this will give some protection. She still is very tender along the lateral tibia , but xrays of tib fib were normal Advise to be nonweight bearing, cont with crutches Since complaining of poorly controlled pain, will switch her tramadol to norco 5 mg, advise to stop tramadol if taking this and not to use it with other sedating meds She was given mobic as well, precautions also given about mobic Fu with Dr Carlota Raspberry on MOnday  Gross sideeffects, risk and benefits, and alternatives of medications d/w patient. Patient is aware that all medications have potential sideeffects and we are unable to predict every sideeffect or drug-drug interaction that may occur.  Mavryk Pino DO  05/18/2015 10:52 AM

## 2015-05-21 ENCOUNTER — Ambulatory Visit (INDEPENDENT_AMBULATORY_CARE_PROVIDER_SITE_OTHER): Payer: Worker's Compensation | Admitting: Family Medicine

## 2015-05-21 ENCOUNTER — Ambulatory Visit: Payer: Worker's Compensation

## 2015-05-21 VITALS — BP 128/68 | HR 77 | Temp 98.4°F | Resp 16 | Ht 66.0 in | Wt 269.0 lb

## 2015-05-21 DIAGNOSIS — M25512 Pain in left shoulder: Secondary | ICD-10-CM

## 2015-05-21 DIAGNOSIS — R0781 Pleurodynia: Secondary | ICD-10-CM | POA: Diagnosis not present

## 2015-05-21 DIAGNOSIS — S8011XD Contusion of right lower leg, subsequent encounter: Secondary | ICD-10-CM | POA: Diagnosis not present

## 2015-05-21 DIAGNOSIS — T148 Other injury of unspecified body region: Secondary | ICD-10-CM

## 2015-05-21 DIAGNOSIS — T148XXA Other injury of unspecified body region, initial encounter: Secondary | ICD-10-CM

## 2015-05-21 NOTE — Progress Notes (Signed)
 Chief Complaint:  Chief Complaint  [name]    Worker's Comp   . Leg Pain    right, x1 week   . Back Pain    HPI: Anita James is a 64 y.o. female who reports to [facility] today complaining of  Left shoulder pain and right leg pain and rib pain, minimal improvement. She cannot use crutches and completely be nonweightbearing because too hard on her shoudlers and hands. She is not having any n/w/t but has pain. She states she has a 2 percent improved since her fall. She states that she cannot walk or move without pain, she has been managing a little better with the changes in pain meds but not much.   LAst OV  Anita James is a 64 y.o. female who reports to [facility] today complaining of recheck on her rib contusion, her shoulder pain and also her knee and lower leg pain. She states that she has been wearing the immobilzer and it is hurting her and causing her problems when she has to move and use the bathroom. She also feels like her pain is not adequately controlled. She still has 9-10/10 pain. She was suppose to come inlater but because she hurts so much she cannot tolerate it. She was already on tramadol so that was what she has been using. She was not prescribed anything except was told to be nonweighbearing and also use the crutches nad knee immobilizer. She wants to bend her leg. Deneis CP or SOB   IMPRESSION: 1. No evidence of fracture or dislocation. 2. Chondrocalcinosis of the knee joint.   Electronically Signed  By: Suzy Bouchard M.D.  On: 05/15/2015 17:38  CLINICAL DATA: Fall at work today. Left posterior chest pain and rib contusion. Initial encounter.  EXAM: LEFT RIBS AND CHEST - 3+ VIEW  COMPARISON: 09/19/2014  FINDINGS: No fracture or other bone lesions are seen involving the ribs. There is no evidence of pneumothorax or pleural effusion. Both lungs are clear. Heart size and mediastinal contours are within normal  limits.  IMPRESSION: Negative.   Electronically Signed  By: Earle Gell M.D.  On: 05/15/2015 17:39  LAst OV with Dr Carlota Raspberry  Anita James is a 64 y.o. female who presents to the [facility] For an injury that occurred at work at 47 am. 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 Pt states she was getting out of a [employer] that had just been washed, stepping onto a wet running board of the vehicle with her left foot when she slipped an fell to her left hitting her neck and back on a running board of another vehicle and twisted her right leg during fall. Pt had some assistance getting up after the fall and was able to bear weight to stand on her own. She was able to drive a couple more vehicles at work but pain worsened and beginning to limp from right pain. She now presents with right upper back pain, abrasion on left elbow and left lower leg but no pain and right leg pain. She took Motrin and Tylenol for the pain. She denies having HA, nausea emesis, weakness,    ROS: The patient denies fevers, chills, night sweats, unintentional weight loss, chest pain, palpitations, wheezing, dyspnea on exertion, nausea, vomiting, abdominal pain, dysuria, hematuria, melena, acute numbness,  or tingling.   All other systems have been reviewed and were otherwise negative with the exception of those mentioned in  the HPI and as above.    PHYSICAL EXAM: Filed Vitals:   05/21/15 1257  BP: 128/68  Pulse: 77  Temp: 98.4 F (36.9 C)  Resp: 16   Body mass index is 43.44 kg/(m^2).   General: Alert, no acute distress HEENT:  Normocephalic, atraumatic, oropharynx patent. EOMI, PERRLA, fundoscopic exam nl Cardiovascular:  Regular rate and rhythm, no rubs murmurs or gallops.  No Carotid bruits, radial pulse intact. No pedal edema.  Respiratory: Clear to auscultation bilaterally.  No wheezes, rales, or rhonchi.  No cyanosis, no use of accessory musculature Abdominal: No organomegaly,  abdomen is soft and non-tender, positive bowel sounds. No masses. Skin: No rashes. Neurologic: Facial musculature symmetric. Psychiatric: Patient acts appropriately throughout our interaction. Lymphatic: No cervical or submandibular lymphadenopathy Musculoskeletal: Gait antalgic, she is using crutches.  Head exam normal Neck exam normal ft shoulder-she still has about 30 degrees of flexion and cannot really move it beyond that in other directions, she states it hurts too much, full grip strength, sensation intact Right knee-minimally tender diffusely, full ROM in extension and flexion Tib fib-minimally tender laterally along proximal tib fib, no calf swelling, neg Homan's   LABS:   EKG/XRAY:   Primary read interpreted by Dr. Marin Comment at [facility]. Neg for fracture or dislocation   ASSESSMENT/PLAN: Encounter Diagnoses  Name Primary?  . ft shoulder pain Yes  . Contusion   . Contusion of right lower leg, subsequent encounter   . Rib pain   . Sprain and strain    Ms. MVay has had a 2 percent improvement since she first presented, she needs to be referred and seen by ortho and referred to PT.  Cont with current meds Refer to ortho, if no appt with ortho then see me in 1 week, I will refer her to PT at that time.  Work note given Fu in 1 week   Gross sideeffects, risk and benefits, and alternatives of medications d/w patient. Patient is aware that all medications have potential sideeffects and we are unable to predict every sideeffect or drug-drug interaction that may occur.    DO  05/21/2015 2:35 PM

## 2015-05-28 ENCOUNTER — Ambulatory Visit (INDEPENDENT_AMBULATORY_CARE_PROVIDER_SITE_OTHER): Payer: Worker's Compensation | Admitting: Family Medicine

## 2015-05-28 VITALS — BP 122/80 | HR 90 | Temp 98.5°F | Resp 16 | Ht 66.0 in | Wt 265.0 lb

## 2015-05-28 DIAGNOSIS — M79661 Pain in right lower leg: Secondary | ICD-10-CM | POA: Diagnosis not present

## 2015-05-28 DIAGNOSIS — M25512 Pain in left shoulder: Secondary | ICD-10-CM | POA: Diagnosis not present

## 2015-05-28 DIAGNOSIS — T148 Other injury of unspecified body region: Secondary | ICD-10-CM | POA: Diagnosis not present

## 2015-05-28 DIAGNOSIS — R0781 Pleurodynia: Secondary | ICD-10-CM | POA: Diagnosis not present

## 2015-05-28 DIAGNOSIS — T148XXA Other injury of unspecified body region, initial encounter: Secondary | ICD-10-CM

## 2015-05-28 NOTE — Progress Notes (Addendum)
      Chief Complaint:  Chief Complaint  [name]    right leg pain, left shoulder painx 3-4 weeks, Workers Comp     HPI: Anita James is a 64 y.o. female who reports to [facility] today for a recheck of her WC injuries, and is complaining of no improvement of  left shoulder pain, rib contusion, and also left leg pain  .  She does not want anything done with PT until she sees work comp ortho referral. She She can't walk without the brace. She uses her crutches to try to walk.  She has not bee to see ortho. She is more aggravated.  She got sick with the medicine. She is back on tramadol. She feels like it does not Try ing to walk with brace off is 10/10 on right lateral  Collateral ligament and fibula.  Sh eis on 8/10 pain but she can;t hold weight off of it.    ROS: The patient denies fevers, chills, night sweats, unintentional weight loss, chest pain, palpitations, wheezing, dyspnea on exertion, nausea, vomiting, abdominal pain, dysuria, hematuria, melena,   All other systems have been reviewed and were otherwise negative with the exception of those mentioned in the HPI and as above.    PHYSICAL EXAM: Filed Vitals:   05/28/15 1724  BP: 122/80  Pulse: 90  Temp: 98.5 F (36.9 C)  Resp: 16   Body mass index is 42.79 kg/(m^2).   General: Alert, no acute distress, obese white female HEENT:  Normocephalic, atraumatic, oropharynx patent. EOMI, PERRLA Cardiovascular:  Regular rate and rhythm, no rubs murmurs or gallops.  Respiratory: Clear to auscultation bilaterally.  No wheezes, rales, or rhonchi.  No cyanosis, no use of accessory musculature Abdominal: No organomegaly, abdomen is soft and non-tender, positive bowel sounds. No masses. Skin: No rashes. Neurologic: Facial musculature symmetric. Psychiatric: Patient acts appropriately throughout our interaction. Lymphatic: No cervical or submandibular lymphadenopathy Musculoskeletal: Gait intact. No edema,  tenderness, neg homan LEft shoulder-she has actually a lot more ROM when no one is looking and she is trying to move herself in her chair, she is able to go into almost full ER,, IR but when we do it in [facility] then it is less than 20 degrees + paramsk tenderness left shoulder ribs  Right leg- 5/5 strength No saddle anesthesia Straight leg +/- Tender along lateral promimal fibula along LCL     EKG/XRAY:   Primary read interpreted by Dr. Marin Comment at [facility]   ASSESSMENT/PLAN: Encounter Diagnoses  Name Primary?  . Contusion Yes  . Left shoulder pain   . Rib pain   . Sprain and strain   . Pain of right lower leg    Continue with current regiment, she as having nausea so stopped hydorcodone, only on tramadol with no releif Crutches, brace, fu with ortho, waiting for [facility] approval for referral. Decline PT until she sees ortho Fu with ortho otherwise she can see [place] if no appt in 1 week   Gross sideeffects, risk and benefits, and alternatives of medications d/w patient. Patient is aware that all medications have potential sideeffects and we are unable to predict every sideeffect or drug-drug interaction that may occur.  Bentleigh Waren DO  05/30/2015 2:38 PM

## 2015-06-04 ENCOUNTER — Ambulatory Visit (INDEPENDENT_AMBULATORY_CARE_PROVIDER_SITE_OTHER): Payer: Worker's Compensation | Admitting: Family Medicine

## 2015-06-04 VITALS — BP 138/86 | HR 89 | Temp 98.4°F | Resp 16 | Ht 66.0 in | Wt 265.0 lb

## 2015-06-04 DIAGNOSIS — S86911D Strain of unspecified muscle(s) and tendon(s) at lower leg level, right leg, subsequent encounter: Secondary | ICD-10-CM

## 2015-06-04 DIAGNOSIS — M25512 Pain in left shoulder: Secondary | ICD-10-CM

## 2015-06-04 NOTE — Progress Notes (Addendum)
Right knee and left shoulder pains Subjective:  [name], female    DOB: [birth year]  Age: 64 y.o. 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 She has persisted in having some problems since then. The crutches seem to make her shoulder and left chest hurt more, so she is only using 1. She is ambulatory now. She continues to have a some pain in the right leg lateral to and below the knee. She also hurts in her left shoulder girdle area and posterior upper back. Using a crutch makes her hurt in the left axillary area. She is driving her own vehicle, able to get in and out of it. She feels she can do her duty at work. She had understood she might be being referred for a MRI, but that was never done. She did have an orthopedic referral placed, but she has never heard from that.   Objective:   Pain mildly in the left shoulder on palpation, especially it seems to be posterior in the scapular area. She is able to raise her arm, going up to a full 180 when walking her fingers up the wall. Her grip and strength are good. Her right leg is grossly normal. She had a knee brace on when she came. She does not seem to be tender at all on the joint line. No varus or valgus stress problems. Tenderness seems to be lateral to the knee and below it, along the proximal fibula area. Patient ambulated independently back and forth in the examining room without using any devices.  Assessment & Plan:   Assessment:  Left shoulder pain Right leg strain and pain  Plan:  I believe she should get some physical therapy to strengthen and mobilize these painful areas which are persisting. However she is able to function well enough to return to the job place.  Will discontinue orthopedics consult which has never come through, and make a referral to physical therapy.    Patient Instructions  Wear the knee brace when  active  Referral is being made to physical therapy for strengthening and reconditioning of your knee and shoulder  Take extreme caution when getting in and out of vehicles to avoid falls.  We will cancel the orthopedic referral.     HOPPER,DAVID, MD 06/04/2015

## 2015-06-04 NOTE — Patient Instructions (Addendum)
Wear the knee brace when active  Referral is being made to physical therapy for strengthening and reconditioning of your knee and shoulder  Take extreme caution when getting in and out of vehicles to avoid falls.  We will cancel the orthopedic referral.

## 2015-06-07 ENCOUNTER — Telehealth: Payer: Self-pay

## 2015-06-07 NOTE — Telephone Encounter (Signed)
At the patient's request I made a copy of x ray on CD and placed in the pick up drawer.  

## 2015-06-07 NOTE — Telephone Encounter (Signed)
Pt has an appointment on Monday 06/10/15 at [place] she will need an xray disc of RT Lower Leg from 05/15/15, Chest from 05/15/15 & LT Shoulder from 05/21/15 Pt to pick up Saturday morning, 06/08/15

## 2015-06-07 NOTE — Telephone Encounter (Signed)
Copy of note sent to Xray.

## 2015-06-10 ENCOUNTER — Other Ambulatory Visit: Payer: Self-pay

## 2015-06-10 DIAGNOSIS — E1140 Type 2 diabetes mellitus with diabetic neuropathy, unspecified: Secondary | ICD-10-CM

## 2015-06-10 DIAGNOSIS — E119 Type 2 diabetes mellitus without complications: Secondary | ICD-10-CM

## 2015-06-10 MED ORDER — INSULIN GLARGINE 100 UNIT/ML ~~LOC~~ SOLN: 60.0000 [IU] | Freq: Every day | SUBCUTANEOUS | Status: DC

## 2015-06-11 ENCOUNTER — Telehealth: Payer: Self-pay

## 2015-06-11 NOTE — Telephone Encounter (Signed)
Spoke with Crystal at the [facility] Department Verbal order given for Lantus flex pens Inject 60 units into the skin at bedtime 23mls ordered with three refills

## 2015-06-18 ENCOUNTER — Ambulatory Visit (INDEPENDENT_AMBULATORY_CARE_PROVIDER_SITE_OTHER): Payer: Worker's Compensation | Admitting: Family Medicine

## 2015-06-18 VITALS — BP 132/80 | HR 99 | Temp 98.7°F | Resp 17 | Ht 67.0 in | Wt 271.0 lb

## 2015-06-18 DIAGNOSIS — M25512 Pain in left shoulder: Secondary | ICD-10-CM

## 2015-06-18 DIAGNOSIS — M542 Cervicalgia: Secondary | ICD-10-CM | POA: Diagnosis not present

## 2015-06-18 DIAGNOSIS — S86911D Strain of unspecified muscle(s) and tendon(s) at lower leg level, right leg, subsequent encounter: Secondary | ICD-10-CM

## 2015-06-18 DIAGNOSIS — S161XXD Strain of muscle, fascia and tendon at neck level, subsequent encounter: Secondary | ICD-10-CM | POA: Diagnosis not present

## 2015-06-18 NOTE — Patient Instructions (Signed)
Continue trying to be as active in moving your neck and shoulder as possible without causing too much discomfort.   Continue ambulating to stretch and strengthen the leg.

## 2015-06-18 NOTE — Progress Notes (Signed)
[name], female    DOB: 12-Oct-1950  Age: 64 y.o. MRN: [id number]  Chief Complaint  [name]    knee and shoulde     Subjective:   Since being here the patient has continued to work. She does not get up and down into tall vehicles that have running boards, otherwise has been able to do her job and is gradually improving. She never heard from physical therapy. She did get a call about the orthopedic referral, but told them that we had canceled it. Her shoulder and neck continued to hurt to a moderate degree. She also still has pain below and lateral to the right knee. Has been wearing the brace which helps her feel better and more secure there. She has had previous surgeries on that knee.  Current allergies, medications, problem list, past/family and social histories reviewed.  Objective:  BP 132/80 mmHg  Pulse 99  Temp(Src) 98.7 F (37.1 C) (Oral)  Resp 17  Ht 5\' 7"  (1.702 m)  Wt 271 lb (122.925 kg)  BMI 42.43 kg/m2  SpO2 97%  No major acute distress. Range of motion of the neck is fair. She is tender in the paracervical muscles. Especially on the left. The right knee has tenderness just below and lateral to the knee still.  Assessment & Plan:   Assessment: 1. Neck pain on left side   2. Left shoulder pain   3. Neck strain, subsequent encounter   4. Strain of right knee and leg, subsequent encounter       Plan: Reviewed my old notes. I clearly canceled the orthopedic consult and requested the physical therapy. Somebody however discontinued the physical therapy referral because it was deferred until the orthopedist saw her. I don't think she needs an orthopedist but I do think she needs the physical therapy as originally ordered. I will try and push the priority on this since it has been delayed longer than it should have.  Orders Placed This Encounter  Procedures  . Ambulatory referral to Physical Therapy    Referral Priority:  Urgent     Referral Type:  Physical Medicine    Referral Reason:  Specialty Services Required    Requested Specialty:  Physical Therapy    Number of Visits Requested:  1    Patient Instructions  Continue trying to be as active in moving your neck and shoulder as possible without causing too much discomfort.   Continue ambulating to stretch and strengthen the leg.     Return in about 2 weeks (around 07/02/2015).   HOPPER,DAVID, MD 06/18/2015

## 2015-06-20 ENCOUNTER — Encounter: Payer: Self-pay | Admitting: Internal Medicine

## 2015-06-20 ENCOUNTER — Ambulatory Visit: Payer: Self-pay | Attending: Internal Medicine | Admitting: Internal Medicine

## 2015-06-20 ENCOUNTER — Telehealth: Payer: Self-pay | Admitting: Internal Medicine

## 2015-06-20 VITALS — BP 145/79 | HR 72 | Temp 98.8°F | Resp 16 | Ht 66.0 in | Wt 273.8 lb

## 2015-06-20 DIAGNOSIS — J0110 Acute frontal sinusitis, unspecified: Secondary | ICD-10-CM | POA: Insufficient documentation

## 2015-06-20 DIAGNOSIS — E119 Type 2 diabetes mellitus without complications: Secondary | ICD-10-CM | POA: Insufficient documentation

## 2015-06-20 LAB — GLUCOSE, POCT (MANUAL RESULT ENTRY): POC GLUCOSE: 131 mg/dL — AB (ref 70–99)

## 2015-06-20 MED ORDER — AZITHROMYCIN 250 MG PO TABS: ORAL_TABLET | ORAL | Status: DC

## 2015-06-20 NOTE — Progress Notes (Signed)
Patient states she is here for sinus infection Has been having a lot of sinus pressure drainage and headaches

## 2015-06-20 NOTE — Telephone Encounter (Signed)
Pharmacy called for patient to have medication changed as well. Please send medication to [facility] in [facility].

## 2015-06-20 NOTE — Progress Notes (Signed)
[name], female   DOB: [birth year], 64 y.o.   MRN: [id number]  CC: headaches, congestion  HPI: Anita James is a 64 y.o. female here today for a follow up visit.  Patient has past medical history of DM, CHF, thyroid disease, GERD.  Patient presents with a one day history of facial pressure, frontal headaches, and rhinitis. She denies cough, fevers, chills. She is convinced she has a sinus infection and is demanding antibiotic therapy.  Patient reports that 5 weeks ago she fell at her part time job and fractured her right knee and a left rib fracture. She states that she slipped on some water and her leg went the opposite direction. She reports that she has been told by her Orthopedics that she can not get her cast due to her diabetics. She is currently waiting on physical therapy.    Allergies  Allergen Reactions  . Amoxicillin-Pot Clavulanate Nausea And Vomiting and Other (See Comments)    Combination of medications tear lining of stomach  . Sulfa Antibiotics Shortness Of Breath   Past Medical History  Diagnosis Date  . Diabetes mellitus type II, [name]   . Frequent headaches   . Chronic back pain   . Chronic kidney disease 03/2013    failure due to meds  . Chest pain   . Obesity   . Carpal tunnel syndrome 06/14/2014  . 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 Vitamin D deficiency 06/05/2014   Current Outpatient Prescriptions on File Prior to Visit  Medication Sig Dispense Refill  . atorvastatin (LIPITOR) 40 MG tablet Take 1 tablet (40 mg total) by mouth daily. 90 tablet 3  . dapagliflozin propanediol (FARXIGA) 10 MG TABS tablet Take 10 mg by mouth daily. 90 tablet 3  . Dexlansoprazole (DEXILANT) 30 MG capsule Take 1 capsule (30 mg total) by mouth daily. 30 capsule 1  . gabapentin (NEURONTIN) 300 MG capsule Take 2 capsules (600 mg  total) by mouth 2 (two) times daily. 120 capsule 5  . insulin aspart (NOVOLOG) 100 UNIT/ML FlexPen Inject 30 Units into the skin 3 (three) times daily with meals. 15 mL 5  . insulin glargine (LANTUS) 100 UNIT/ML injection Inject 0.6 mLs (60 Units total) into the skin at bedtime. 6 vial 5  . levothyroxine (SYNTHROID, LEVOTHROID) 100 MCG tablet Take 1 tablet (100 mcg total) by mouth daily before breakfast. 90 tablet 0  . lisinopril (PRINIVIL,ZESTRIL) 40 MG tablet TAKE 1 TABLET BY MOUTH  DAILY 90 tablet 0  . meloxicam (MOBIC) 7.5 MG tablet Take 1 tablet (7.5 mg total) by mouth 2 (two) times daily as needed for pain. Take with food, no other NSAIDs 30 tablet 0  . metoprolol succinate (TOPROL-XL) 50 MG 24 hr tablet TAKE 2 TABLETS BY MOUTH DAILY WITH OR IMMEDIATELY FOLLOWING A MEAL 90 tablet 1  . mometasone (NASONEX) 50 MCG/ACT nasal spray Place 2 sprays into the nose daily. 17 g 5  . Multiple Vitamins-Minerals (EYE VITAMINS) CAPS Take 1 capsule by mouth daily.    . nitroGLYCERIN (NITROSTAT) 0.4 MG SL tablet Place 0.4 mg under the tongue every 5 (five) minutes as needed for chest pain.    . pregabalin (LYRICA) 50 MG capsule Take 1 capsule (50 mg total) by mouth 3 (three) times daily. 90 capsule 5  .  sertraline (ZOLOFT) 50 MG tablet Take 50 mg by mouth 3 (three) times daily as needed.     . traMADol (ULTRAM) 50 MG tablet Take 2 tablets (100 mg total) by mouth every 12 (twelve) hours as needed for severe pain. 120 tablet 1  . HYDROcodone-acetaminophen (NORCO) 5-325 MG per tablet Take 1 tablet by mouth every 8 (eight) hours as needed for moderate pain. Use with stool softener, stop tramadol 30 tablet 0  . ipratropium (ATROVENT) 0.06 % nasal spray Place 2 sprays into both nostrils 4 (four) times daily. 15 mL 0  . [DISCONTINUED] fenofibrate (TRICOR) 145 MG tablet Take 1 tablet (145 mg total) by mouth daily. 30 tablet 2  . [DISCONTINUED] insulin detemir (LEVEMIR) 100 UNIT/ML injection Inject 60 Units into the  skin at bedtime. 10 mL 1  . [DISCONTINUED] metoprolol (LOPRESSOR) 50 MG tablet Take 1 tablet (50 mg total) by mouth 2 (two) times daily. 60 tablet 2   No current facility-administered medications on file prior to visit.   Family History  Problem Relation Age of Onset  . Cancer Mother     cervical- mets  . Cancer Father   . Heart disease Father   . Diabetes Father   . Diabetes Sister   . Heart disease Sister   . Heart disease Sister   . COPD Sister    Social History   Social History  . Marital Status: Divorced    Spouse Name: N/A  . Number of Children: 6  . Years of Education: BA   Occupational History  . unemployed    Social History Main Topics  . Smoking status: Never Smoker   . Smokeless tobacco: Never Used  . Alcohol Use: No  . Drug Use: No  . Sexual Activity: Not on file   Other Topics Concern  . Not on file   Social History Narrative    Review of Systems: Other than what is stated in HPI, all other systems are negative.   Objective:   Filed Vitals:   06/20/15 1434  BP: 145/79  Pulse: 72  Temp: 98.8 F (37.1 C)  Resp: 16    Physical Exam  HENT:  Right Ear: External ear normal.  Left Ear: External ear normal.  Mouth/Throat: Oropharynx is clear and moist.  Eyes: Pupils are equal, round, and reactive to light. Right eye exhibits no discharge. Left eye exhibits no discharge.  Neck: Normal range of motion.  Cardiovascular: Normal rate, regular rhythm and normal heart sounds.   Pulmonary/Chest: Effort normal and breath sounds normal.  Musculoskeletal:  Has right knee brace  Lymphadenopathy:    She has no cervical adenopathy.     Lab Results  Component Value Date   WBC 5.3 12/23/2013   HGB 14.2 12/23/2013   HCT 40.4 12/23/2013   MCV 87.1 12/23/2013   PLT 189 12/23/2013   Lab Results  Component Value Date   CREATININE 0.70 12/23/2013   BUN 10 12/23/2013   NA 144 12/23/2013   K 4.1 12/23/2013   CL 104 12/23/2013   CO2 27 12/23/2013     Lab Results  Component Value Date   HGBA1C 6.30 03/29/2015   Lipid Panel     Component Value Date/Time   CHOL 190 11/12/2014 1142   TRIG 235* 11/12/2014 1142   HDL 40* 11/12/2014 1142   CHOLHDL 4.8 11/12/2014 1142   VLDL 47* 11/12/2014 1142   LDLCALC 103* 11/12/2014 1142       Assessment and plan:   Anita James  was seen today for sinusitis.  Diagnoses and all orders for this visit:  Acute frontal sinusitis, recurrence not specified -     azithromycin (ZITHROMAX) 250 MG tablet; Take 2 pills today and then one pill each day after until complete I do not feel patient has a infection at this time and I have stressed this with her. I will give patient antibiotic but I have stressed that she does the watch and wait. IF no improvement in 5 days she may begin medication.  Type 2 diabetes mellitus without complication -     Glucose (CBG) Stable, continue current therapy   Return in about 4 weeks (around 07/18/2015) for Diabetes Mellitus.       Lance Bosch, [facility] [phone] 06/20/2015, 3:13 PM

## 2015-06-20 NOTE — Telephone Encounter (Signed)
Patient called to request a med change for azithromycin (ZITHROMAX) 250 MG tablet to something different, patient stated that the medication is not on the free list. Please f/u

## 2015-06-20 NOTE — Patient Instructions (Signed)
I highly encourage you to do symptom management for 4-5 days before taking antibiotic. This is the peak of allergy season since fall has started and it may likely be allergy related. Drink plenty of fluids and rest. May use your nasal spray as well to help with sinus congestion (Nasonex)---you should still have some left

## 2015-06-21 ENCOUNTER — Other Ambulatory Visit: Payer: Self-pay

## 2015-06-21 DIAGNOSIS — J0110 Acute frontal sinusitis, unspecified: Secondary | ICD-10-CM

## 2015-06-21 MED ORDER — CIPROFLOXACIN HCL 500 MG PO TABS: 500.0000 mg | ORAL_TABLET | Freq: Two times a day (BID) | ORAL | Status: DC

## 2015-06-21 NOTE — Telephone Encounter (Signed)
Patient called requesting a different antibiotic that is on the free list at [facility]. Nurse called Anita James for list of free medications comparable to prescribed antibiotic. Per Dr. Adrian Blackwater, prescribe Cipro 500mg  twice a daily for 10 days. Nurse will enter prescription and send to [facility] in [place] per patient request.

## 2015-06-21 NOTE — Telephone Encounter (Signed)
Patient called requesting a med change for azithromycin (ZITHROMAX) 250 MG tablet to something different because patient stated that the medication is not on the free list.  Patient would like for it to be sent to [facility] in [facility]. Please f/u with pt.

## 2015-06-25 ENCOUNTER — Ambulatory Visit: Payer: Self-pay | Admitting: Pharmacist

## 2015-06-25 ENCOUNTER — Ambulatory Visit: Payer: Self-pay

## 2015-07-02 ENCOUNTER — Ambulatory Visit: Payer: Self-pay

## 2015-07-04 ENCOUNTER — Ambulatory Visit (INDEPENDENT_AMBULATORY_CARE_PROVIDER_SITE_OTHER): Payer: Worker's Compensation | Admitting: Emergency Medicine

## 2015-07-04 VITALS — BP 122/78 | HR 92 | Temp 98.2°F | Resp 18 | Ht 66.0 in | Wt 271.8 lb

## 2015-07-04 DIAGNOSIS — S86911D Strain of unspecified muscle(s) and tendon(s) at lower leg level, right leg, subsequent encounter: Secondary | ICD-10-CM

## 2015-07-04 DIAGNOSIS — M542 Cervicalgia: Secondary | ICD-10-CM | POA: Diagnosis not present

## 2015-07-04 MED ORDER — CELECOXIB 200 MG PO CAPS: 200.0000 mg | ORAL_CAPSULE | Freq: Every day | ORAL | Status: DC

## 2015-07-04 MED ORDER — CYCLOBENZAPRINE HCL 5 MG PO TABS: 5.0000 mg | ORAL_TABLET | Freq: Three times a day (TID) | ORAL | Status: DC | PRN

## 2015-07-04 NOTE — Progress Notes (Signed)
   Subjective:  [name], female    DOB: [birth year]  Age: 64 y.o. MRN: [id number]  CC: Follow-up   HPI Anita James presents  with a number of complaints. She was injured in a fall at work on 05/15/2015. She's been treated for neck shoulder and right knee pain. She is ambulating comfortably without crutches wears a  knee brace at work. She denies having any medication for pain. She can't tolerate nonsteroidals. She has improved since her last visit she's been unable to arrange an appointment with either orthopedics or physical therapy  History  Past medical family and social history noncontributory   Review of Systems  Review of systems is noncontributory  Objective:  BP 122/78 mmHg  Pulse 92  Temp(Src) 98.2 F (36.8 C) (Oral)  Resp 18  Ht 5\' 6"  (1.676 m)  Wt 271 lb 12.8 oz (123.288 kg)  BMI 43.89 kg/m2  SpO2 96%  Physical Exam  Constitutional: She is oriented to person, place, and time. She appears well-developed and well-nourished.  HENT:  Head: Normocephalic and atraumatic.  Eyes: Conjunctivae are normal. Pupils are equal, round, and reactive to light.  Pulmonary/Chest: Effort normal.  Musculoskeletal: She exhibits no edema.       Right knee: She exhibits normal range of motion, no swelling, no effusion and no ecchymosis.       Cervical back: She exhibits tenderness.  Neurological: She is alert and oriented to person, place, and time.  Skin: Skin is dry.  Psychiatric: She has a normal mood and affect. Her behavior is normal. Thought content normal.      Assessment & Plan:   Zlata was seen today for follow-up.  Diagnoses and all orders for this visit:  Neck pain on left side  Strain of right knee and leg, subsequent encounter  Other orders -     celecoxib (CELEBREX) 200 MG capsule; Take 1 capsule (200 mg total) by mouth daily. -     cyclobenzaprine (FLEXERIL) 5 MG tablet; Take 1 tablet (5 mg total) by mouth 3 (three) times daily as needed for  muscle spasms.  I have discontinued Ms. Rasnic's traMADol, HYDROcodone-acetaminophen, and meloxicam. I am also having her start on celecoxib and cyclobenzaprine. Additionally, I am having her maintain her EYE VITAMINS, nitroGLYCERIN, sertraline, ipratropium, atorvastatin, mometasone, gabapentin, pregabalin, dapagliflozin propanediol, Dexlansoprazole, metoprolol succinate, lisinopril, levothyroxine, insulin aspart, insulin glargine, azithromycin, and ciprofloxacin.  Meds ordered this encounter  Medications  . celecoxib (CELEBREX) 200 MG capsule    Sig: Take 1 capsule (200 mg total) by mouth daily.    Dispense:  30 capsule    Refill:  0  . cyclobenzaprine (FLEXERIL) 5 MG tablet    Sig: Take 1 tablet (5 mg total) by mouth 3 (three) times daily as needed for muscle spasms.    Dispense:  30 tablet    Refill:  1   Continued on light duty for 2 weeks we'll see her back in 2 weeks put her on Flexeril and Celebrex  Appropriate red flag conditions were discussed with the patient as well as actions that should be taken.  Patient expressed his understanding.  Follow-up: Return in 2 weeks (on 07/18/2015), or if symptoms worsen or fail to improve.  Roselee Culver, MD

## 2015-07-09 ENCOUNTER — Other Ambulatory Visit: Payer: Self-pay | Admitting: Family Medicine

## 2015-07-11 ENCOUNTER — Telehealth: Payer: Self-pay | Admitting: Internal Medicine

## 2015-07-11 ENCOUNTER — Other Ambulatory Visit: Payer: Self-pay

## 2015-07-11 MED ORDER — METOPROLOL SUCCINATE ER 50 MG PO TB24: ORAL_TABLET | ORAL | Status: DC

## 2015-07-11 NOTE — Telephone Encounter (Signed)
Patient came in requesting a medication refill for   metoprolol succinate (TOPROL-XL)      Please follow up with patient

## 2015-07-15 ENCOUNTER — Emergency Department (HOSPITAL_COMMUNITY)
Admission: EM | Admit: 2015-07-15 | Discharge: 2015-07-15 | Disposition: A | Discharge status: Home / Self Care | Payer: Self-pay | Attending: Emergency Medicine | Admitting: Emergency Medicine

## 2015-07-15 ENCOUNTER — Encounter (HOSPITAL_COMMUNITY): Payer: Self-pay | Admitting: Emergency Medicine

## 2015-07-15 DIAGNOSIS — G629 Polyneuropathy, unspecified: Secondary | ICD-10-CM | POA: Insufficient documentation

## 2015-07-15 DIAGNOSIS — F419 Anxiety disorder, unspecified: Secondary | ICD-10-CM | POA: Insufficient documentation

## 2015-07-15 DIAGNOSIS — Z79899 Other long term (current) drug therapy: Secondary | ICD-10-CM | POA: Insufficient documentation

## 2015-07-15 DIAGNOSIS — E119 Type 2 diabetes mellitus without complications: Secondary | ICD-10-CM | POA: Insufficient documentation

## 2015-07-15 DIAGNOSIS — L0201 Cutaneous abscess of face: Secondary | ICD-10-CM | POA: Insufficient documentation

## 2015-07-15 DIAGNOSIS — Z7951 Long term (current) use of inhaled steroids: Secondary | ICD-10-CM | POA: Insufficient documentation

## 2015-07-15 DIAGNOSIS — I129 Hypertensive chronic kidney disease with stage 1 through stage 4 chronic kidney disease, or unspecified chronic kidney disease: Secondary | ICD-10-CM | POA: Insufficient documentation

## 2015-07-15 DIAGNOSIS — E039 Hypothyroidism, unspecified: Secondary | ICD-10-CM | POA: Insufficient documentation

## 2015-07-15 DIAGNOSIS — Z880 Allergy status to penicillin: Secondary | ICD-10-CM | POA: Insufficient documentation

## 2015-07-15 DIAGNOSIS — G8929 Other chronic pain: Secondary | ICD-10-CM | POA: Insufficient documentation

## 2015-07-15 DIAGNOSIS — Z792 Long term (current) use of antibiotics: Secondary | ICD-10-CM | POA: Insufficient documentation

## 2015-07-15 DIAGNOSIS — N189 Chronic kidney disease, unspecified: Secondary | ICD-10-CM | POA: Insufficient documentation

## 2015-07-15 DIAGNOSIS — E669 Obesity, unspecified: Secondary | ICD-10-CM | POA: Insufficient documentation

## 2015-07-15 DIAGNOSIS — E785 Hyperlipidemia, unspecified: Secondary | ICD-10-CM | POA: Insufficient documentation

## 2015-07-15 DIAGNOSIS — D849 Immunodeficiency, unspecified: Secondary | ICD-10-CM | POA: Insufficient documentation

## 2015-07-15 DIAGNOSIS — Z8701 Personal history of pneumonia (recurrent): Secondary | ICD-10-CM | POA: Insufficient documentation

## 2015-07-15 DIAGNOSIS — Z794 Long term (current) use of insulin: Secondary | ICD-10-CM | POA: Insufficient documentation

## 2015-07-15 DIAGNOSIS — L03211 Cellulitis of face: Secondary | ICD-10-CM | POA: Insufficient documentation

## 2015-07-15 MED ORDER — LIDOCAINE HCL (PF) 1 % IJ SOLN
5.0000 mL | Freq: Once | INTRAMUSCULAR | Status: AC
  Administered 2015-07-15: 5 mL
  Filled 2015-07-15: qty 5

## 2015-07-15 MED ORDER — HYDROCODONE-ACETAMINOPHEN 5-325 MG PO TABS: 1.0000 | ORAL_TABLET | ORAL | Status: DC | PRN

## 2015-07-15 MED ORDER — DOXYCYCLINE HYCLATE 100 MG PO CAPS: 100.0000 mg | ORAL_CAPSULE | Freq: Two times a day (BID) | ORAL | Status: DC

## 2015-07-15 NOTE — Discharge Instructions (Signed)
Read the information below.  Use the prescribed medication as directed.  Please discuss all new medications with your pharmacist.  Do not take additional tylenol while taking the prescribed pain medication to avoid overdose.  You may return to the Emergency Department at any time for worsening condition or any new symptoms that concern you.    If you develop increased redness, swelling, uncontrolled pain, or fevers greater than 100.4, return to the ER immediately for a recheck.     Abscess An abscess is an infected area that contains a collection of pus and debris.It can occur in almost any part of the body. An abscess is also known as a furuncle or boil. CAUSES  An abscess occurs when tissue gets infected. This can occur from blockage of oil or sweat glands, infection of hair follicles, or a minor injury to the skin. As the body tries to fight the infection, pus collects in the area and creates pressure under the skin. This pressure causes pain. People with weakened immune systems have difficulty fighting infections and get certain abscesses more often.  SYMPTOMS Usually an abscess develops on the skin and becomes a painful mass that is red, warm, and tender. If the abscess forms under the skin, you may feel a moveable soft area under the skin. Some abscesses break open (rupture) on their own, but most will continue to get worse without care. The infection can spread deeper into the body and eventually into the bloodstream, causing you to feel ill.  DIAGNOSIS  Your caregiver will take your medical history and perform a physical exam. A sample of fluid may also be taken from the abscess to determine what is causing your infection. TREATMENT  Your caregiver may prescribe antibiotic medicines to fight the infection. However, taking antibiotics alone usually does not cure an abscess. Your caregiver may need to make a small cut (incision) in the abscess to drain the pus. In some cases, gauze is packed into  the abscess to reduce pain and to continue draining the area. HOME CARE INSTRUCTIONS   Only take over-the-counter or prescription medicines for pain, discomfort, or fever as directed by your caregiver.  If you were prescribed antibiotics, take them as directed. Finish them even if you start to feel better.  If gauze is used, follow your caregiver's directions for changing the gauze.  To avoid spreading the infection:  Keep your draining abscess covered with a bandage.  Wash your hands well.  Do not share personal care items, towels, or whirlpools with others.  Avoid skin contact with others.  Keep your skin and clothes clean around the abscess.  Keep all follow-up appointments as directed by your caregiver. SEEK MEDICAL CARE IF:   You have increased pain, swelling, redness, fluid drainage, or bleeding.  You have muscle aches, chills, or a general ill feeling.  You have a fever. MAKE SURE YOU:   Understand these instructions.  Will watch your condition.  Will get help right away if you are not doing well or get worse.   This information is not intended to replace advice given to you by your health care provider. Make sure you discuss any questions you have with your health care provider.   Document Released: 06/17/2005 Document Revised: 03/08/2012 Document Reviewed: 11/20/2011 Elsevier Interactive Patient Education 2016 Elsevier Inc[employer]  Cellulitis Cellulitis is an infection of the skin and the tissue beneath it. The infected area is usually red and tender. Cellulitis occurs most often in the arms and lower  legs.  CAUSES  Cellulitis is caused by bacteria that enter the skin through cracks or cuts in the skin. The most common types of bacteria that cause cellulitis are staphylococci and streptococci. SIGNS AND SYMPTOMS   Redness and warmth.  Swelling.  Tenderness or pain.  Fever. DIAGNOSIS  Your health care provider can usually determine what is wrong based on a  physical exam. Blood tests may also be done. TREATMENT  Treatment usually involves taking an antibiotic medicine. HOME CARE INSTRUCTIONS   Take your antibiotic medicine as directed by your health care provider. Finish the antibiotic even if you start to feel better.  Keep the infected arm or leg elevated to reduce swelling.  Apply a warm cloth to the affected area up to 4 times per day to relieve pain.  Take medicines only as directed by your health care provider.  Keep all follow-up visits as directed by your health care provider. SEEK MEDICAL CARE IF:   You notice red streaks coming from the infected area.  Your red area gets larger or turns dark in color.  Your bone or joint underneath the infected area becomes painful after the skin has healed.  Your infection returns in the same area or another area.  You notice a swollen bump in the infected area.  You develop new symptoms.  You have a fever. SEEK IMMEDIATE MEDICAL CARE IF:   You feel very sleepy.  You develop vomiting or diarrhea.  You have a general ill feeling (malaise) with muscle aches and pains.   This information is not intended to replace advice given to you by your health care provider. Make sure you discuss any questions you have with your health care provider.   Document Released: 06/17/2005 Document Revised: 05/29/2015 Document Reviewed: 11/23/2011 Elsevier Interactive Patient Education 2016 Elsevier Inc.  Incision and Drainage Incision and drainage is a procedure in which a sac-like structure (cystic structure) is opened and drained. The area to be drained usually contains material such as pus, fluid, or blood.  LET YOUR CAREGIVER KNOW ABOUT:   Allergies to medicine.  Medicines taken, including vitamins, herbs, eyedrops, over-the-counter medicines, and creams.  Use of steroids (by mouth or creams).  Previous problems with anesthetics or numbing medicines.  History of bleeding problems or blood  clots.  Previous surgery.  Other health problems, including diabetes and kidney problems.  Possibility of pregnancy, if this applies. RISKS AND COMPLICATIONS  Pain.  Bleeding.  Scarring.  Infection. BEFORE THE PROCEDURE  You may need to have an ultrasound or other imaging tests to see how large or deep your cystic structure is. Blood tests may also be used to determine if you have an infection or how severe the infection is. You may need to have a tetanus shot. PROCEDURE  The affected area is cleaned with a cleaning fluid. The cyst area will then be numbed with a medicine (local anesthetic). A small incision will be made in the cystic structure. A syringe or catheter may be used to drain the contents of the cystic structure, or the contents may be squeezed out. The area will then be flushed with a cleansing solution. After cleansing the area, it is often gently packed with a gauze or another wound dressing. Once it is packed, it will be covered with gauze and tape or some other type of wound dressing. AFTER THE PROCEDURE   Often, you will be allowed to go home right after the procedure.  You may be given antibiotic medicine  to prevent or heal an infection.  If the area was packed with gauze or some other wound dressing, you will likely need to come back in 1 to 2 days to get it removed.  The area should heal in about 14 days.   This information is not intended to replace advice given to you by your health care provider. Make sure you discuss any questions you have with your health care provider.   Document Released: 03/03/2001 Document Revised: 03/08/2012 Document Reviewed: 11/02/2011 Elsevier Interactive Patient Education [employer].

## 2015-07-15 NOTE — ED Provider Notes (Signed)
CSN: [id number]     Arrival date & time 07/15/15  1652 History   First MD Initiated Contact with Patient 07/15/15 1808     Chief Complaint  Patient presents with  . Abscess     (Consider location/radiation/quality/duration/timing/severity/associated sxs/prior Treatment) HPI   Pt reports she woke up Saturday morning with pain and swelling over her left chin. The area has been warm.  Pain radiates into her left face.   Has had this in the past requiring I&D.  Denies fevers, chills, sore throat, dental pain, difficulty breathing or swallowing.    Past Medical History  Diagnosis Date  . Diabetes mellitus type II, uncontrolled ([place])   . Hypertension   . Hypothyroidism   . Hiatal hernia   . Hyperlipemia   . Diverticulitis   . Pneumonia   . Anxiety   . Neuropathy ([place])   . Frequent headaches   . Chronic back pain   . Chronic kidney disease 03/2013    failure due to meds  . Chest pain   . Obesity   . Carpal tunnel syndrome 06/14/2014  . 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 Vitamin D deficiency 06/05/2014   Past Surgical History  Procedure Laterality Date  . Cesarean section    . Knee surgery Left   . Thyroid surgery      goiter   Family History  Problem Relation Age of Onset  . Cancer Mother     cervical- mets  . Cancer Father   . Heart disease Father   . Diabetes Father   . Diabetes Sister   . Heart disease Sister   . Heart disease Sister   . COPD Sister    Social History  Substance Use Topics  . Smoking status: Never Smoker   . Smokeless tobacco: Never Used  . Alcohol Use: No   OB History    Gravida Para Term Preterm AB TAB SAB Ectopic Multiple Living   4 3 2 1 1   1 2 6      Review of Systems  Constitutional: Negative for fever and chills.  Musculoskeletal: Negative for myalgias and neck stiffness.  Skin: Positive for color change.  Allergic/Immunologic: Positive for immunocompromised state (diabetes).  Hematological: Does not bruise/bleed easily.    Psychiatric/Behavioral: Negative for self-injury.      Allergies  Amoxicillin-pot clavulanate; Sulfa antibiotics; and Azithromycin  Home Medications   Prior to Admission medications   Medication Sig Start Date End Date Taking? Authorizing Provider  atorvastatin (LIPITOR) 40 MG tablet Take 1 tablet (40 mg total) by mouth daily. 11/13/14   Boykin Nearing, MD  azithromycin (ZITHROMAX) 250 MG tablet Take 2 pills today and then one pill each day after until complete 06/20/15   Lance Bosch, NP  celecoxib (CELEBREX) 200 MG capsule Take 1 capsule (200 mg total) by mouth daily. 07/04/15   Roselee Culver, MD  ciprofloxacin (CIPRO) 500 MG tablet Take 1 tablet (500 mg total) by mouth 2 (two) times daily. 06/21/15   Josalyn Funches, MD  cyclobenzaprine (FLEXERIL) 5 MG tablet Take 1 tablet (5 mg total) by mouth 3 (three) times daily as needed for muscle spasms. 07/04/15   Roselee Culver, MD  dapagliflozin propanediol (FARXIGA) 10 MG TABS tablet Take 10 mg by mouth daily. 02/21/15   Lance Bosch, NP  Dexlansoprazole (DEXILANT) 30 MG capsule Take 1 capsule (30 mg total) by mouth daily. 03/19/15   Lance Bosch, NP  gabapentin (NEURONTIN) 300 MG capsule Take 2  capsules (600 mg total) by mouth 2 (two) times daily. 01/15/15   Josalyn Funches, MD  insulin aspart (NOVOLOG) 100 UNIT/ML FlexPen Inject 30 Units into the skin 3 (three) times daily with meals. 05/13/15   Lance Bosch, NP  insulin glargine (LANTUS) 100 UNIT/ML injection Inject 0.6 mLs (60 Units total) into the skin at bedtime. 06/10/15   Lance Bosch, NP  ipratropium (ATROVENT) 0.06 % nasal spray Place 2 sprays into both nostrils 4 (four) times daily. 08/26/14   Lutricia Feil, PA  levothyroxine (SYNTHROID, LEVOTHROID) 100 MCG tablet Take 1 tablet (100 mcg total) by mouth daily before breakfast. 04/26/15   Lorayne Marek, MD  lisinopril (PRINIVIL,ZESTRIL) 40 MG tablet TAKE 1 TABLET BY MOUTH  DAILY 04/09/15   Tresa Garter, MD   metoprolol succinate (TOPROL-XL) 50 MG 24 hr tablet TAKE 2 TABLETS BY MOUTH DAILY WITH OR IMMEDIATELY FOLLOWING A MEAL 07/11/15   Lance Bosch, NP  mometasone (NASONEX) 50 MCG/ACT nasal spray Place 2 sprays into the nose daily. 01/02/15   Josalyn Funches, MD  Multiple Vitamins-Minerals (EYE VITAMINS) CAPS Take 1 capsule by mouth daily. 08/11/12   Thurnell Lose, MD  nitroGLYCERIN (NITROSTAT) 0.4 MG SL tablet Place 0.4 mg under the tongue every 5 (five) minutes as needed for chest pain. 10/06/12   Thurnell Lose, MD  pregabalin (LYRICA) 50 MG capsule Take 1 capsule (50 mg total) by mouth 3 (three) times daily. 01/18/15   Tresa Garter, MD  sertraline (ZOLOFT) 50 MG tablet Take 50 mg by mouth 3 (three) times daily as needed.     Historical Provider, MD   BP 165/95 mmHg  Pulse 101  Temp(Src) 97.9 F (36.6 C) (Oral)  Resp 16  SpO2 95% Physical Exam  Constitutional: She appears well-developed and well-nourished. No distress.  HENT:  Head: Normocephalic and atraumatic.    Neck: Neck supple.  Pulmonary/Chest: Effort normal.  Neurological: She is alert.  Skin: She is not diaphoretic.  Nursing note and vitals reviewed.   ED Course  Procedures (including critical care time) Labs Review Labs Reviewed - No data to display  Imaging Review No results found. I have personally reviewed and evaluated these images and lab results as part of my medical decision-making.   EKG Interpretation None       EMERGENCY DEPARTMENT US SOFT TISSUE INTERPRETATION "Study: Limited Ultrasound of the noted body part in comments below"  INDICATIONS: Soft tissue infection Multiple views of the body part are obtained with a multi-frequency linear probe  PERFORMED BY:  Myself  IMAGES ARCHIVED?: No    SIDE:Left  BODY PART:Other soft tisse (comment in note)  FINDINGS: Cellulitis present  LIMITATIONS:  Emergent Procedure  INTERPRETATION:  Cellulitis present  COMMENT:  Tiny area of fluid  collection under central scab.    INCISION AND DRAINAGE Performed by: Clayton Bibles Consent: Verbal consent obtained. Risks and benefits: risks, benefits and alternatives were discussed Type: abscess  Body area: left chin  Anesthesia: local infiltration  Incision was made with a scalpel.  Local anesthetic: lidocaine 1% no epinephrine  Anesthetic total: 1 ml  Complexity: complex Blunt dissection to break up loculations  Drainage: purulent  Drainage amount: small  Packing material: none  Patient tolerance: Patient tolerated the procedure well with no immediate complications.    7:54 PM Pt called the ED and was connected to my phone - she was at the pharmacy and found that doxycycline not covered by pharmacy for free. She  is allergic to bactrim.  Clindamycin is also not covered.  Pt insisted she must have a free antibiotic.  Verbalized change to pharmacist for pt to take Keflex instead, QID x 7 days.  Pt also told me she cannot take vicodin - I have advised her to not take it and to take tylenol or ibuprofen as needed instead.    MDM   Final diagnoses:  Facial cellulitis  Facial abscess    Afebrile, nontoxic patient with cellulitis of left chin with tiny bit of underlying fluid collection.  Discussed at length with pt and she begged me to I&D the area.  Small amount of pus extracted.  Mostly cellulitis.   D/C home with antibiotics, pain medication - please see note above regarding change after pt arrived to pharmacy.  PCP follow up in 2 days for recheck.  Discussed result, findings, treatment, and follow up  with patient.  Pt given return precautions.  Pt verbalizes understanding and agrees with plan.            Clayton Bibles, PA-C 07/15/15 1957  Milton Ferguson, MD 07/18/15 [phone]

## 2015-07-15 NOTE — ED Notes (Signed)
Pt c/o abscess on L side of chin since Saturday morning. Pt has hx of the same. Pt c/o pain at site. Pt has had them lanced before. Pt denies fever, N/V. Red swollen area noted to L chin. A&Ox4 and ambulatory.

## 2015-07-23 ENCOUNTER — Telehealth: Payer: Self-pay

## 2015-07-23 ENCOUNTER — Telehealth: Payer: Self-pay | Admitting: Internal Medicine

## 2015-07-23 DIAGNOSIS — E038 Other specified hypothyroidism: Secondary | ICD-10-CM

## 2015-07-23 MED ORDER — LEVOTHYROXINE SODIUM 100 MCG PO TABS: 100.0000 ug | ORAL_TABLET | Freq: Every day | ORAL | Status: DC

## 2015-07-23 NOTE — Telephone Encounter (Signed)
Tried to return patient phone call Called all three numbers listed in chart none of the Numbers are accepting incoming calls

## 2015-07-23 NOTE — Telephone Encounter (Signed)
Patient called requesting a medication refill for levothyroxine (SYNTHROID, LEVOTHROID). Please follow up with patient.

## 2015-08-02 ENCOUNTER — Other Ambulatory Visit: Payer: Self-pay | Admitting: Emergency Medicine

## 2015-08-05 ENCOUNTER — Other Ambulatory Visit: Payer: Self-pay

## 2015-08-05 DIAGNOSIS — E119 Type 2 diabetes mellitus without complications: Secondary | ICD-10-CM

## 2015-08-05 DIAGNOSIS — E1140 Type 2 diabetes mellitus with diabetic neuropathy, unspecified: Secondary | ICD-10-CM

## 2015-08-05 DIAGNOSIS — Z794 Long term (current) use of insulin: Principal | ICD-10-CM

## 2015-08-05 MED ORDER — INSULIN ASPART 100 UNIT/ML FLEXPEN: 30.0000 [IU] | PEN_INJECTOR | Freq: Three times a day (TID) | SUBCUTANEOUS | Status: DC

## 2015-08-29 ENCOUNTER — Telehealth: Payer: Self-pay | Admitting: Internal Medicine

## 2015-08-29 ENCOUNTER — Telehealth: Payer: Self-pay

## 2015-08-29 NOTE — Telephone Encounter (Signed)
Returned call to patient  The home number and mobile number are not in service Unable to leave message

## 2015-08-29 NOTE — Telephone Encounter (Signed)
Benicar 40mg  tab

## 2015-09-10 ENCOUNTER — Encounter: Payer: Self-pay | Admitting: Family Medicine

## 2015-09-10 ENCOUNTER — Ambulatory Visit: Payer: Self-pay | Attending: Family Medicine | Admitting: Family Medicine

## 2015-09-10 VITALS — BP 159/79 | HR 93 | Temp 98.2°F | Resp 16 | Ht 66.0 in | Wt 274.0 lb

## 2015-09-10 DIAGNOSIS — J029 Acute pharyngitis, unspecified: Secondary | ICD-10-CM | POA: Insufficient documentation

## 2015-09-10 DIAGNOSIS — Z794 Long term (current) use of insulin: Secondary | ICD-10-CM | POA: Insufficient documentation

## 2015-09-10 DIAGNOSIS — K219 Gastro-esophageal reflux disease without esophagitis: Secondary | ICD-10-CM | POA: Insufficient documentation

## 2015-09-10 DIAGNOSIS — L0292 Furuncle, unspecified: Secondary | ICD-10-CM | POA: Insufficient documentation

## 2015-09-10 DIAGNOSIS — E1140 Type 2 diabetes mellitus with diabetic neuropathy, unspecified: Secondary | ICD-10-CM | POA: Insufficient documentation

## 2015-09-10 DIAGNOSIS — Z880 Allergy status to penicillin: Secondary | ICD-10-CM | POA: Insufficient documentation

## 2015-09-10 DIAGNOSIS — I10 Essential (primary) hypertension: Secondary | ICD-10-CM | POA: Insufficient documentation

## 2015-09-10 DIAGNOSIS — E119 Type 2 diabetes mellitus without complications: Secondary | ICD-10-CM | POA: Insufficient documentation

## 2015-09-10 DIAGNOSIS — R6883 Chills (without fever): Secondary | ICD-10-CM | POA: Insufficient documentation

## 2015-09-10 DIAGNOSIS — J0140 Acute pansinusitis, unspecified: Secondary | ICD-10-CM | POA: Insufficient documentation

## 2015-09-10 DIAGNOSIS — Z79899 Other long term (current) drug therapy: Secondary | ICD-10-CM | POA: Insufficient documentation

## 2015-09-10 LAB — GLUCOSE, POCT (MANUAL RESULT ENTRY): POC GLUCOSE: 158 mg/dL — AB (ref 70–99)

## 2015-09-10 LAB — POCT GLYCOSYLATED HEMOGLOBIN (HGB A1C): HEMOGLOBIN A1C: 7.2

## 2015-09-10 MED ORDER — DOXYCYCLINE HYCLATE 100 MG PO TABS: 100.0000 mg | ORAL_TABLET | Freq: Two times a day (BID) | ORAL | Status: DC

## 2015-09-10 MED ORDER — INSULIN GLARGINE 100 UNIT/ML ~~LOC~~ SOLN: 60.0000 [IU] | Freq: Every day | SUBCUTANEOUS | Status: DC

## 2015-09-10 MED ORDER — DEXLANSOPRAZOLE 30 MG PO CPDR: 30.0000 mg | DELAYED_RELEASE_CAPSULE | Freq: Every day | ORAL | Status: DC

## 2015-09-10 NOTE — Progress Notes (Signed)
Patients c/o head cold, runny nose with right ear pain, sore throat numbness in stomach from needle stick and blurry vision.  Patient has boil on buttock.  [facility] Patient states she didn;t take her BP meds.  Dexilant 30mg  Lantus Actor

## 2015-09-10 NOTE — Progress Notes (Signed)
Subjective:  [name], female    DOB: 27-Feb-1951  Age: 64 y.o. MRN: [id number]  CC: Chills and Sore Throat   HPI [name] presents with a three day history of nasal congestion, sinus pressure, sore throat, otalgia and post nasal drain, chills. She has not had a fever and denies history of sick contacts. Also complains of a boil under her nose and on her buttock but denies any drainage from it.  Medical history is significant for type 2 DM (a1c 7.2), hyperlipidemia, hypertension, hypothyroidism, gastroesophageal reflux disease and she remains compliant with all her medications but admits to forgetting to take her antihypertensive this morning hence elevated blood pressure. She is requesting a refill for Dexilant which she takes for GERD  Outpatient Prescriptions Prior to Visit  Medication Sig Dispense Refill  . atorvastatin (LIPITOR) 40 MG tablet Take 1 tablet (40 mg total) by mouth daily. 90 tablet 3  . celecoxib (CELEBREX) 200 MG capsule Take 1 capsule (200 mg total) by mouth daily. 30 capsule 0  . ciprofloxacin (CIPRO) 500 MG tablet Take 1 tablet (500 mg total) by mouth 2 (two) times daily. 20 tablet 0  . cyclobenzaprine (FLEXERIL) 5 MG tablet Take 1 tablet (5 mg total) by mouth 3 (three) times daily as needed for muscle spasms. 30 tablet 1  . dapagliflozin propanediol (FARXIGA) 10 MG TABS tablet Take 10 mg by mouth daily. 90 tablet 3  . gabapentin (NEURONTIN) 300 MG capsule Take 2 capsules (600 mg total) by mouth 2 (two) times daily. 120 capsule 5  . insulin aspart (NOVOLOG) 100 UNIT/ML FlexPen Inject 30 Units into the skin 3 (three) times daily with meals. 15 mL 5  . ipratropium (ATROVENT) 0.06 % nasal spray Place 2 sprays into both nostrils 4 (four) times daily. 15 mL 0  . levothyroxine (SYNTHROID, LEVOTHROID) 100 MCG tablet Take 1 tablet (100 mcg total) by mouth daily before breakfast. 90 tablet 0  . lisinopril (PRINIVIL,ZESTRIL) 40 MG tablet TAKE 1 TABLET BY MOUTH   DAILY 90 tablet 0  . metoprolol succinate (TOPROL-XL) 50 MG 24 hr tablet TAKE 2 TABLETS BY MOUTH DAILY WITH OR IMMEDIATELY FOLLOWING A MEAL 90 tablet 1  . mometasone (NASONEX) 50 MCG/ACT nasal spray Place 2 sprays into the nose daily. 17 g 5  . Multiple Vitamins-Minerals (EYE VITAMINS) CAPS Take 1 capsule by mouth daily.    . nitroGLYCERIN (NITROSTAT) 0.4 MG SL tablet Place 0.4 mg under the tongue every 5 (five) minutes as needed for chest pain.    . pregabalin (LYRICA) 50 MG capsule Take 1 capsule (50 mg total) by mouth 3 (three) times daily. 90 capsule 5  . sertraline (ZOLOFT) 50 MG tablet Take 50 mg by mouth 3 (three) times daily as needed.     Marland Kitchen azithromycin (ZITHROMAX) 250 MG tablet Take 2 pills today and then one pill each day after until complete 6 tablet 0  . Dexlansoprazole (DEXILANT) 30 MG capsule Take 1 capsule (30 mg total) by mouth daily. 30 capsule 1  . doxycycline (VIBRAMYCIN) 100 MG capsule Take 1 capsule (100 mg total) by mouth 2 (two) times daily. One po bid x 7 days 14 capsule 0  . insulin glargine (LANTUS) 100 UNIT/ML injection Inject 0.6 mLs (60 Units total) into the skin at bedtime. 6 vial 5   No facility-administered medications prior to visit.    ROS Review of Systems  Constitutional: Positive for chills. Negative for fever, activity change, appetite change and fatigue.  HENT:       See hpi  Eyes: Negative for visual disturbance.  Respiratory: Negative for cough, chest tightness, shortness of breath and wheezing.   Cardiovascular: Negative for chest pain and palpitations.  Gastrointestinal: Negative for abdominal pain, constipation and abdominal distention.  Endocrine: Negative for polydipsia.  Genitourinary: Negative for dysuria and frequency.  Musculoskeletal: Negative for back pain and arthralgias.  Skin: Positive for rash (boil on nose and buttock).  Neurological: Negative for tremors, light-headedness and numbness.  Hematological: Does not bruise/bleed  easily.  Psychiatric/Behavioral: Negative for behavioral problems and agitation.    Objective:  BP 159/79 mmHg  Pulse 93  Temp(Src) 98.2 F (36.8 C) (Oral)  Resp 16  Ht 5\' 6"  (1.676 m)  Wt 274 lb (124.286 kg)  BMI 44.25 kg/m2  SpO2 95%  BP/Weight 09/10/2015 07/15/2015 [id number]  Systolic BP [id number]  Diastolic BP 79 76 78  Wt. (Lbs) 274 - 271.8  BMI 44.25 - 43.89      Physical Exam  Constitutional: She is oriented to person, place, and time. She appears well-developed and well-nourished.  Cardiovascular: Normal rate, normal heart sounds and intact distal pulses.   No murmur heard. Pulmonary/Chest: Effort normal and breath sounds normal. She has no wheezes. She has no rales. She exhibits no tenderness.  Abdominal: Soft. Bowel sounds are normal. She exhibits no distension and no mass. There is no tenderness.  Musculoskeletal: Normal range of motion.  Neurological: She is alert and oriented to person, place, and time.  Skin: Rash (boil on butt cheek) noted.     Assessment & Plan:   1. Acute pansinusitis, recurrence not specified She is penicillin allergic and so I will place her on doxycycline - doxycycline (VIBRA-TABS) 100 MG tablet; Take 1 tablet (100 mg total) by mouth 2 (two) times daily.  Dispense: 20 tablet; Refill: 0  2. Type 2 diabetes mellitus without complication, with long-term current use of insulin ([facility]) Controlled with A1c of 7.2 - insulin glargine (LANTUS) 100 UNIT/ML injection; Inject 0.6 mLs (60 Units total) into the skin at bedtime.  Dispense: 6 vial; Refill: 1  3. Type 2 diabetes, controlled, with neuropathy ([facility]) - Glucose (CBG) - POCT A1C - insulin glargine (LANTUS) 100 UNIT/ML injection; Inject 0.6 mLs (60 Units total) into the skin at bedtime.  Dispense: 6 vial; Refill: 1  4. Essential hypertension Uncontrolled due to running out of medications which she just picked up from the pharmacy today.  5. Gastroesophageal reflux disease without  esophagitis - Dexlansoprazole (DEXILANT) 30 MG capsule; Take 1 capsule (30 mg total) by mouth daily.  Dispense: 30 capsule; Refill: 1  6. Boil Sitz bath. She is a resident antibiotic for sinusitis which should help with this.   Meds ordered this encounter  Medications  . doxycycline (VIBRA-TABS) 100 MG tablet    Sig: Take 1 tablet (100 mg total) by mouth 2 (two) times daily.    Dispense:  20 tablet    Refill:  0  . Dexlansoprazole (DEXILANT) 30 MG capsule    Sig: Take 1 capsule (30 mg total) by mouth daily.    Dispense:  30 capsule    Refill:  1  . insulin glargine (LANTUS) 100 UNIT/ML injection    Sig: Inject 0.6 mLs (60 Units total) into the skin at bedtime.    Dispense:  6 vial    Refill:  1    Follow-up: Return in about 3 weeks (around 10/01/2015) for follow up of diabetes mellitus with PCP.  Arnoldo Morale MD

## 2015-09-11 ENCOUNTER — Other Ambulatory Visit: Payer: Self-pay

## 2015-09-11 NOTE — Telephone Encounter (Signed)
error 

## 2015-09-12 ENCOUNTER — Telehealth: Payer: Self-pay | Admitting: Internal Medicine

## 2015-09-12 NOTE — Telephone Encounter (Signed)
Patient came in the day before yesterday and was prescribed an antibiotic and the patient is wondering if we are able to prescribe her a stronger dosage. Please follow up with pt.

## 2015-09-13 ENCOUNTER — Telehealth: Payer: Self-pay | Admitting: *Deleted

## 2015-09-13 NOTE — Telephone Encounter (Signed)
Left patient voicemail message to return call to clinic.

## 2015-09-13 NOTE — Telephone Encounter (Signed)
Spoke to patient regarding her wanting a "stronger antibiotic" for her sinus infection.  RN spoke to Dr. Jarold Song who states patient needs to continue on antibiotics prescribed.  Patient disagrees with this plan and says she needs something stronger.  RN provided emotional support.

## 2015-09-17 NOTE — Telephone Encounter (Signed)
refill 

## 2015-09-30 ENCOUNTER — Ambulatory Visit: Payer: Self-pay

## 2015-10-08 ENCOUNTER — Other Ambulatory Visit: Payer: Self-pay | Admitting: Internal Medicine

## 2015-10-08 ENCOUNTER — Other Ambulatory Visit: Payer: Self-pay

## 2015-10-08 MED ORDER — METOPROLOL SUCCINATE ER 50 MG PO TB24: ORAL_TABLET | ORAL | Status: DC

## 2015-10-08 MED FILL — GABAPENTIN 600 MG TABLET: 600 | 30 days supply | Qty: 60 | Fill #8

## 2015-10-08 MED FILL — LISINOPRIL 40 MG TABLET: 40 | 30 days supply | Qty: 30 | Fill #8

## 2015-10-08 MED FILL — METOPROLOL SUCC ER 50 MG TA: 50 | 30 days supply | Qty: 60 | Fill #0

## 2015-10-08 NOTE — Telephone Encounter (Signed)
Patient came in requesting a medication refill for metroprolol. Please follow up.

## 2015-10-22 ENCOUNTER — Telehealth: Payer: Self-pay | Admitting: *Deleted

## 2015-10-22 NOTE — Telephone Encounter (Signed)
Tried to call [facility] to see if they are the pharmacy requesting  benicar refill  Unavailable at this time Message left for some one there  To return our call

## 2015-10-22 NOTE — Telephone Encounter (Signed)
Patients pharmacy is requesting Benicar Refill. Benicar is not listed on patients medication list.

## 2015-10-22 NOTE — Telephone Encounter (Signed)
I do not have patient on Benicar. My notes states that she is on Lisinopril

## 2015-10-29 ENCOUNTER — Other Ambulatory Visit: Payer: Self-pay | Admitting: *Deleted

## 2015-10-29 ENCOUNTER — Telehealth: Payer: Self-pay

## 2015-10-29 ENCOUNTER — Ambulatory Visit: Payer: Self-pay

## 2015-10-29 NOTE — Telephone Encounter (Signed)
No i will not switch. I do not see in chart where she has ever been on Benicar. Lisinopril 40 has been given to her since 2014. Plus Benicar has 2 different medications in it and it is stronger than what she is on now. So she will need to have her BP checked and if it is too high then we can possible do the switch.

## 2015-10-29 NOTE — Telephone Encounter (Signed)
Called patient to clarify her medication Patient would like to be on benicar because she can [facility] it [facility] Can we switch this back

## 2015-10-29 NOTE — Telephone Encounter (Signed)
Anita James did you ever contact patient and find out if she is on Benicar or Lisinopril. Please make sure she is not on both. The chart states that she should be on Lisinopril.

## 2015-11-06 ENCOUNTER — Telehealth: Payer: Self-pay

## 2015-11-06 MED FILL — LISINOPRIL 40 MG TABLET: 40 | 30 days supply | Qty: 30 | Fill #9

## 2015-11-06 MED FILL — GABAPENTIN 600 MG TABLET: 600 | 30 days supply | Qty: 60 | Fill #9

## 2015-11-06 MED FILL — METOPROLOL SUCC ER 50 MG TA: 50 | 30 days supply | Qty: 60 | Fill #1

## 2015-11-06 NOTE — Telephone Encounter (Signed)
Tried to contact patient in reference to her benicar RX Patient has been on lisinopril for months  i [facility] and she will update Her medication in the system to reflect she is on lisinopril

## 2015-11-07 ENCOUNTER — Other Ambulatory Visit: Payer: Self-pay

## 2015-11-07 DIAGNOSIS — E119 Type 2 diabetes mellitus without complications: Secondary | ICD-10-CM

## 2015-11-07 DIAGNOSIS — Z794 Long term (current) use of insulin: Principal | ICD-10-CM

## 2015-11-07 DIAGNOSIS — E1140 Type 2 diabetes mellitus with diabetic neuropathy, unspecified: Secondary | ICD-10-CM

## 2015-11-07 MED ORDER — INSULIN ASPART 100 UNIT/ML FLEXPEN: 30.0000 [IU] | PEN_INJECTOR | Freq: Three times a day (TID) | SUBCUTANEOUS | Status: DC

## 2015-11-14 ENCOUNTER — Ambulatory Visit: Payer: Self-pay

## 2015-12-09 ENCOUNTER — Other Ambulatory Visit: Payer: Self-pay | Admitting: Family Medicine

## 2015-12-09 MED FILL — GABAPENTIN 600 MG TABLET: 600 | 30 days supply | Qty: 60 | Fill #10

## 2015-12-09 MED FILL — LISINOPRIL 40 MG TABLET: 40 | 30 days supply | Qty: 30 | Fill #0

## 2015-12-09 MED FILL — METOPROLOL SUCC ER 50 MG TA: 50 | 30 days supply | Qty: 60 | Fill #2

## 2016-01-09 ENCOUNTER — Other Ambulatory Visit: Payer: Self-pay | Admitting: Internal Medicine

## 2016-01-09 DIAGNOSIS — E1140 Type 2 diabetes mellitus with diabetic neuropathy, unspecified: Secondary | ICD-10-CM

## 2016-01-09 MED ORDER — GABAPENTIN 300 MG PO CAPS: 600.0000 mg | ORAL_CAPSULE | Freq: Two times a day (BID) | ORAL | Status: DC

## 2016-01-09 MED FILL — GABAPENTIN 600 MG TABLET: 600 | 30 days supply | Qty: 60 | Fill #11

## 2016-01-09 MED FILL — METOPROLOL SUCC ER 50 MG TA: 50 | 30 days supply | Qty: 60 | Fill #0

## 2016-01-09 MED FILL — LISINOPRIL 40 MG TABLET: 40 | 30 days supply | Qty: 30 | Fill #0

## 2016-01-09 NOTE — Telephone Encounter (Signed)
Pt. Called requesting to speak to the nurse concerning a medical issue. She stated it's important. Please f/u with pt.

## 2016-01-13 ENCOUNTER — Ambulatory Visit: Payer: Self-pay | Attending: Family Medicine | Admitting: Family Medicine

## 2016-01-13 VITALS — BP 137/81 | HR 77 | Temp 98.0°F | Resp 16 | Ht 66.0 in | Wt 281.6 lb

## 2016-01-13 DIAGNOSIS — I10 Essential (primary) hypertension: Secondary | ICD-10-CM

## 2016-01-13 DIAGNOSIS — E038 Other specified hypothyroidism: Secondary | ICD-10-CM

## 2016-01-13 DIAGNOSIS — K219 Gastro-esophageal reflux disease without esophagitis: Secondary | ICD-10-CM

## 2016-01-13 DIAGNOSIS — IMO0002 Reserved for concepts with insufficient information to code with codable children: Secondary | ICD-10-CM

## 2016-01-13 DIAGNOSIS — H538 Other visual disturbances: Secondary | ICD-10-CM

## 2016-01-13 DIAGNOSIS — Z794 Long term (current) use of insulin: Secondary | ICD-10-CM | POA: Insufficient documentation

## 2016-01-13 DIAGNOSIS — E1165 Type 2 diabetes mellitus with hyperglycemia: Secondary | ICD-10-CM

## 2016-01-13 DIAGNOSIS — E785 Hyperlipidemia, unspecified: Secondary | ICD-10-CM

## 2016-01-13 DIAGNOSIS — Z79899 Other long term (current) drug therapy: Secondary | ICD-10-CM | POA: Insufficient documentation

## 2016-01-13 DIAGNOSIS — E1140 Type 2 diabetes mellitus with diabetic neuropathy, unspecified: Secondary | ICD-10-CM

## 2016-01-13 LAB — GLUCOSE, POCT (MANUAL RESULT ENTRY): POC Glucose: 108 mg/dl — AB (ref 70–99)

## 2016-01-13 LAB — POCT GLYCOSYLATED HEMOGLOBIN (HGB A1C): Hemoglobin A1C: 7.4

## 2016-01-13 MED ORDER — INSULIN GLARGINE 100 UNIT/ML ~~LOC~~ SOLN: 60.0000 [IU] | Freq: Every day | SUBCUTANEOUS | Status: DC

## 2016-01-13 MED ORDER — ATORVASTATIN CALCIUM 40 MG PO TABS: 40.0000 mg | ORAL_TABLET | Freq: Every day | ORAL | Status: DC

## 2016-01-13 MED ORDER — INSULIN ASPART 100 UNIT/ML FLEXPEN: 30.0000 [IU] | PEN_INJECTOR | Freq: Three times a day (TID) | SUBCUTANEOUS | Status: DC

## 2016-01-13 MED ORDER — METOPROLOL SUCCINATE ER 50 MG PO TB24: 50.0000 mg | ORAL_TABLET | Freq: Every day | ORAL | Status: DC

## 2016-01-13 MED ORDER — MOMETASONE FUROATE 50 MCG/ACT NA SUSP: 2.0000 | Freq: Every day | NASAL | Status: DC

## 2016-01-13 MED ORDER — DAPAGLIFLOZIN PROPANEDIOL 10 MG PO TABS: 10.0000 mg | ORAL_TABLET | Freq: Every day | ORAL | Status: DC

## 2016-01-13 MED ORDER — DEXLANSOPRAZOLE 30 MG PO CPDR: 30.0000 mg | DELAYED_RELEASE_CAPSULE | Freq: Every day | ORAL | Status: DC

## 2016-01-13 MED ORDER — LISINOPRIL 40 MG PO TABS: 40.0000 mg | ORAL_TABLET | Freq: Every day | ORAL | Status: DC

## 2016-01-13 MED ORDER — GABAPENTIN 300 MG PO CAPS: 600.0000 mg | ORAL_CAPSULE | Freq: Two times a day (BID) | ORAL | Status: DC

## 2016-01-13 MED ORDER — CELECOXIB 200 MG PO CAPS: 200.0000 mg | ORAL_CAPSULE | Freq: Every day | ORAL | Status: DC

## 2016-01-13 MED ORDER — CYCLOBENZAPRINE HCL 5 MG PO TABS: 5.0000 mg | ORAL_TABLET | Freq: Three times a day (TID) | ORAL | Status: DC | PRN

## 2016-01-13 NOTE — Progress Notes (Signed)
Subjective:  [name], female    DOB: 1950/11/20  Age: 65 y.o. MRN: [id number]  CC: Referral; Ankle Pain; Edema; and Medication Refill   HPI Anita James is a 65 year old female with a history of type 2 diabetes mellitus (A1c 7.4), hypothyroidism, hyperlipidemia, hypertension who comes into the clinic for a follow-up visit.  She complains of blurry vision and has not been to see an ophthalmologist in years; she currently uses over-the-counter reading glasses but states they don't help. She informs me of occasional ankle edema but denies any at this time; edema is dependent.  Has not been compliant with her statin because she says she did not receive any refills of them and so decided not to take it. She would like to have labs to determine if she still needs to take it. Not compliant with exercise and a diabetic diet.  Requests refills of medications.  Outpatient Prescriptions Prior to Visit  Medication Sig Dispense Refill  . levothyroxine (SYNTHROID, LEVOTHROID) 100 MCG tablet Take 1 tablet (100 mcg total) by mouth daily before breakfast. 90 tablet 0  . Multiple Vitamins-Minerals (EYE VITAMINS) CAPS Take 1 capsule by mouth daily.    . celecoxib (CELEBREX) 200 MG capsule Take 1 capsule (200 mg total) by mouth daily. 30 capsule 0  . cyclobenzaprine (FLEXERIL) 5 MG tablet Take 1 tablet (5 mg total) by mouth 3 (three) times daily as needed for muscle spasms. 30 tablet 1  . dapagliflozin propanediol (FARXIGA) 10 MG TABS tablet Take 10 mg by mouth daily. 90 tablet 3  . Dexlansoprazole (DEXILANT) 30 MG capsule Take 1 capsule (30 mg total) by mouth daily. 30 capsule 1  . doxycycline (VIBRA-TABS) 100 MG tablet Take 1 tablet (100 mg total) by mouth 2 (two) times daily. 20 tablet 0  . gabapentin (NEURONTIN) 300 MG capsule Take 2 capsules (600 mg total) by mouth 2 (two) times daily. Patient needs to schedule appointment. 120 capsule 0  . insulin aspart (NOVOLOG) 100 UNIT/ML FlexPen  Inject 30 Units into the skin 3 (three) times daily with meals. 15 mL 5  . insulin glargine (LANTUS) 100 UNIT/ML injection Inject 0.6 mLs (60 Units total) into the skin at bedtime. 6 vial 1  . ipratropium (ATROVENT) 0.06 % nasal spray Place 2 sprays into both nostrils 4 (four) times daily. 15 mL 0  . lisinopril (PRINIVIL,ZESTRIL) 40 MG tablet TAKE 1 TABLET BY MOUTH DAILY. NEEDS OFFICE VISIT FOR REFILLS 30 tablet 0  . metoprolol succinate (TOPROL-XL) 50 MG 24 hr tablet TAKE 2 TABLETS BY MOUTH DAILY WITH OR IMMEDIATELY FOLLOWING A MEAL 90 tablet 0  . mometasone (NASONEX) 50 MCG/ACT nasal spray Place 2 sprays into the nose daily. 17 g 5  . atorvastatin (LIPITOR) 40 MG tablet Take 1 tablet (40 mg total) by mouth daily. (Patient not taking: Reported on 01/13/2016) 90 tablet 3  . ciprofloxacin (CIPRO) 500 MG tablet Take 1 tablet (500 mg total) by mouth 2 (two) times daily. (Patient not taking: Reported on 01/13/2016) 20 tablet 0  . nitroGLYCERIN (NITROSTAT) 0.4 MG SL tablet Place 0.4 mg under the tongue every 5 (five) minutes as needed for chest pain. Reported on 01/13/2016    . pregabalin (LYRICA) 50 MG capsule Take 1 capsule (50 mg total) by mouth 3 (three) times daily. (Patient not taking: Reported on 01/13/2016) 90 capsule 5  . sertraline (ZOLOFT) 50 MG tablet Take 50 mg by mouth 3 (three) times daily as needed. Reported on 01/13/2016  No facility-administered medications prior to visit.    ROS Review of Systems  Constitutional: Negative for activity change, appetite change and fatigue.  HENT: Negative for congestion, sinus pressure and sore throat.   Eyes: Positive for visual disturbance.  Respiratory: Negative for cough, chest tightness, shortness of breath and wheezing.   Cardiovascular: Negative for chest pain and palpitations.  Gastrointestinal: Negative for abdominal pain, constipation and abdominal distention.  Endocrine: Negative for polydipsia.  Genitourinary: Negative for dysuria and  frequency.  Musculoskeletal: Negative for back pain and arthralgias.  Skin: Negative for rash.  Neurological: Negative for tremors, light-headedness and numbness.  Hematological: Does not bruise/bleed easily.  Psychiatric/Behavioral: Negative for behavioral problems and agitation.    Objective:  BP 137/81 mmHg  Pulse 77  Temp(Src) 98 F (36.7 C) (Oral)  Resp 16  Ht 5\' 6"  (1.676 m)  Wt 281 lb 9.6 oz (127.733 kg)  BMI 45.47 kg/m2  SpO2 94%  BP/Weight 01/13/2016 09/10/2015 [id number]  Systolic BP 0000000 [id number]  Diastolic BP 81 79 76  Wt. (Lbs) 281.6 274 -  BMI 45.47 44.25 -      Physical Exam  Constitutional: She is oriented to person, place, and time. She appears well-developed and well-nourished.  Cardiovascular: Normal rate, normal heart sounds and intact distal pulses.   No murmur heard. Pulmonary/Chest: Effort normal and breath sounds normal. She has no wheezes. She has no rales. She exhibits no tenderness.  Abdominal: Soft. Bowel sounds are normal. She exhibits no distension and no mass. There is no tenderness.  Musculoskeletal: Normal range of motion.  Neurological: She is alert and oriented to person, place, and time.  Skin: Skin is warm and dry.  Psychiatric:  Angry   Lab Results  Component Value Date   HGBA1C 7.4 01/13/2016      Assessment & Plan:   1. Type 2 diabetes, uncontrolled, with neuropathy (HCC) A1c 7.4. No regimen changes - Glucose (CBG) - POCT A1C - dapagliflozin propanediol (FARXIGA) 10 MG TABS tablet; Take 10 mg by mouth daily.  Dispense: 90 tablet; Refill: 3 - insulin glargine (LANTUS) 100 UNIT/ML injection; Inject 0.6 mLs (60 Units total) into the skin at bedtime.  Dispense: 6 vial; Refill: 3 - insulin aspart (NOVOLOG) 100 UNIT/ML FlexPen; Inject 30 Units into the skin 3 (three) times daily with meals.  Dispense: 15 mL; Refill: 3 - gabapentin (NEURONTIN) 300 MG capsule; Take 2 capsules (600 mg total) by mouth 2 (two) times daily. Patient  needs to schedule appointment.  Dispense: 120 capsule; Refill: 3 - COMPLETE METABOLIC PANEL WITH GFR - Lipid panel - TSH - Microalbumin / creatinine urine ratio  2. Hyperlipemia Patient has not been compliant with statin. She refuses returning another day to have fasting labs drawn Advised on compliance with statin and low cholesterol diet - atorvastatin (LIPITOR) 40 MG tablet; Take 1 tablet (40 mg total) by mouth daily.  Dispense: 30 tablet; Refill: 3  3. Essential hypertension Controlled  4. Other specified hypothyroidism We'll send off thyroid panel  5. Gastroesophageal reflux disease without esophagitis Controlled - Dexlansoprazole (DEXILANT) 30 MG capsule; Take 1 capsule (30 mg total) by mouth daily.  Dispense: 30 capsule; Refill: 1  6. Blurry vision I have educated the need to see an optometrist or ophthalmologist and community resources available given she has no medical coverage and vision is not covered under the [facility] card The patient is upset that we do not have ophthalmologist in the clinic who can see her as she will have  to pay some amount if she had to present to places like [place], [place], [place], [employer] care.   Meds ordered this encounter  Medications  . dapagliflozin propanediol (FARXIGA) 10 MG TABS tablet    Sig: Take 10 mg by mouth daily.    Dispense:  90 tablet    Refill:  3  . insulin glargine (LANTUS) 100 UNIT/ML injection    Sig: Inject 0.6 mLs (60 Units total) into the skin at bedtime.    Dispense:  6 vial    Refill:  3  . insulin aspart (NOVOLOG) 100 UNIT/ML FlexPen    Sig: Inject 30 Units into the skin 3 (three) times daily with meals.    Dispense:  15 mL    Refill:  3  . atorvastatin (LIPITOR) 40 MG tablet    Sig: Take 1 tablet (40 mg total) by mouth daily.    Dispense:  30 tablet    Refill:  3  . gabapentin (NEURONTIN) 300 MG capsule    Sig: Take 2 capsules (600 mg total) by mouth 2 (two) times daily. Patient needs to schedule appointment.     Dispense:  120 capsule    Refill:  3  . lisinopril (PRINIVIL,ZESTRIL) 40 MG tablet    Sig: Take 1 tablet (40 mg total) by mouth daily.    Dispense:  30 tablet    Refill:  3  . mometasone (NASONEX) 50 MCG/ACT nasal spray    Sig: Place 2 sprays into the nose daily.    Dispense:  17 g    Refill:  3  . Dexlansoprazole (DEXILANT) 30 MG capsule    Sig: Take 1 capsule (30 mg total) by mouth daily.    Dispense:  30 capsule    Refill:  1  . celecoxib (CELEBREX) 200 MG capsule    Sig: Take 1 capsule (200 mg total) by mouth daily.    Dispense:  30 capsule    Refill:  0  . metoprolol succinate (TOPROL XL) 50 MG 24 hr tablet    Sig: Take 1 tablet (50 mg total) by mouth daily. Take with or immediately following a meal.    Dispense:  60 tablet    Refill:  3  . cyclobenzaprine (FLEXERIL) 5 MG tablet    Sig: Take 1 tablet (5 mg total) by mouth 3 (three) times daily as needed for muscle spasms.    Dispense:  90 tablet    Refill:  1    Follow-up: Return in about 3 months (around 04/13/2016) for follow up of Diabetes mellitus.   Arnoldo Morale MD

## 2016-01-13 NOTE — Progress Notes (Signed)
Patient's here for DM/HTN f/up.  Patient c/o swollen ankles x4 days ago,which swelling has since sudsided.  Patient requesting a referral to Optometrist due to blurry vision.  Requesting printed rx for dexilant, novolog, lantus,atrovent, levothyroxine, nasonex.  Refill of celebrex, and flexeril.

## 2016-01-14 ENCOUNTER — Encounter: Payer: Self-pay | Admitting: Family Medicine

## 2016-01-14 ENCOUNTER — Telehealth: Payer: Self-pay

## 2016-01-14 ENCOUNTER — Other Ambulatory Visit: Payer: Self-pay | Admitting: Family Medicine

## 2016-01-14 ENCOUNTER — Encounter: Payer: Self-pay | Admitting: Clinical

## 2016-01-14 DIAGNOSIS — E038 Other specified hypothyroidism: Secondary | ICD-10-CM

## 2016-01-14 LAB — MICROALBUMIN / CREATININE URINE RATIO
CREATININE, URINE: 257 mg/dL (ref 20–320)
MICROALB UR: 89.5 mg/dL
MICROALB/CREAT RATIO: 348 ug/mg{creat} — AB (ref <30)

## 2016-01-14 LAB — COMPLETE METABOLIC PANEL WITH GFR
ALT: 16 U/L (ref 6–29)
AST: 22 U/L (ref 10–35)
Albumin: 4.1 g/dL (ref 3.6–5.1)
Alkaline Phosphatase: 60 U/L (ref 33–130)
BILIRUBIN TOTAL: 0.4 mg/dL (ref 0.2–1.2)
BUN: 10 mg/dL (ref 7–25)
CO2: 28 mmol/L (ref 20–31)
CREATININE: 0.79 mg/dL (ref 0.50–0.99)
Calcium: 9.4 mg/dL (ref 8.6–10.4)
Chloride: 105 mmol/L (ref 98–110)
GFR, Est Non African American: 80 mL/min (ref >=60)
GLUCOSE: 80 mg/dL (ref 65–99)
Potassium: 4.2 mmol/L (ref 3.5–5.3)
Sodium: 144 mmol/L (ref 135–146)
TOTAL PROTEIN: 6.5 g/dL (ref 6.1–8.1)

## 2016-01-14 LAB — TSH: TSH: 0.97 m[IU]/L

## 2016-01-14 LAB — LIPID PANEL
Cholesterol: 202 mg/dL — ABNORMAL HIGH (ref 125–200)
HDL: 38 mg/dL — ABNORMAL LOW (ref >=46)
LDL Cholesterol: 112 mg/dL (ref <130)
TRIGLYCERIDES: 261 mg/dL — AB (ref <150)
Total CHOL/HDL Ratio: 5.3 Ratio — ABNORMAL HIGH (ref <=5.0)
VLDL: 52 mg/dL — ABNORMAL HIGH (ref <30)

## 2016-01-14 MED ORDER — LEVOTHYROXINE SODIUM 100 MCG PO TABS: 100.0000 ug | ORAL_TABLET | Freq: Every day | ORAL | Status: DC

## 2016-01-14 NOTE — Progress Notes (Signed)
Depression screen [facility] 2/9 01/13/2016 09/10/2015 06/04/2015 05/28/2015 05/21/2015  Decreased Interest 3 0 0 0 0  Down, Depressed, Hopeless 3 0 0 0 0  PHQ - 2 Score 6 0 0 0 0  Altered sleeping 3 - - - -  Tired, decreased energy 3 - - - -  Change in appetite 2 - - - -  Feeling bad or failure about yourself  3 - - - -  Trouble concentrating 1 - - - -  Moving slowly or fidgety/restless 1 - - - -  Suicidal thoughts 0 - - - -  PHQ-9 Score 19 - - - -    GAD 7 : Generalized Anxiety Score 01/13/2016  Nervous, Anxious, on Edge 3  Control/stop worrying 3  Worry too much - different things 3  Trouble relaxing 3  Restless 2  Easily annoyed or irritable 2  Afraid - awful might happen 2  Total GAD 7 Score 18

## 2016-01-14 NOTE — Telephone Encounter (Signed)
Patient was contacted verified name and DOB. Patient was informed about fax sent over [facility] Pt was also given lab results, verbalize understanding with no further questions.

## 2016-01-24 ENCOUNTER — Telehealth: Payer: Self-pay | Admitting: Neurology

## 2016-01-24 NOTE — Telephone Encounter (Signed)
error 

## 2016-02-05 ENCOUNTER — Telehealth: Payer: Self-pay | Admitting: Family Medicine

## 2016-02-05 NOTE — Telephone Encounter (Signed)
Patient is stating she still experiencing swelling and tingling on both feet and shortness of pain....  Patient would like to speak to nurse regarding this concern  Patient states she discussed these concerns with PCP on her previous visit and symptoms persist.  Please call patient

## 2016-02-06 MED FILL — GABAPENTIN 300 MG CAPSULE: 300 | 30 days supply | Qty: 120 | Fill #0

## 2016-02-06 MED FILL — METOPROLOL SUCC ER 50 MG TA: 50 | 30 days supply | Qty: 30 | Fill #0

## 2016-02-06 MED FILL — LISINOPRIL 40 MG TABLET: 40 | 30 days supply | Qty: 30 | Fill #0

## 2016-02-06 NOTE — Telephone Encounter (Signed)
She will need an office visit to discuss her numbness as she is already on neurontin for it.

## 2016-02-07 ENCOUNTER — Ambulatory Visit: Payer: Self-pay | Admitting: Family Medicine

## 2016-02-07 NOTE — Telephone Encounter (Signed)
Patient has an appt today, 02/07/2016 to address her medical concerns.

## 2016-02-12 ENCOUNTER — Telehealth: Payer: Self-pay | Admitting: Family Medicine

## 2016-02-12 ENCOUNTER — Other Ambulatory Visit: Payer: Self-pay | Admitting: Family Medicine

## 2016-02-12 MED ORDER — METOPROLOL SUCCINATE ER 100 MG PO TB24: 100.0000 mg | ORAL_TABLET | Freq: Every day | ORAL | Status: DC

## 2016-02-12 NOTE — Telephone Encounter (Signed)
Dr. Jarold Song changed it at her last visit and I am not sure if there is a specific reason. Will forward to Dr. Jarold Song.

## 2016-02-12 NOTE — Telephone Encounter (Signed)
Metoprolol  Patient stated that she normally takes 2 pills a day, Patient stated that the new prescription states to only take one pill.  Patient needs clarity. Please follow up.

## 2016-02-12 NOTE — Telephone Encounter (Signed)
It seems she was on twice a day dosing of Toprol-XL and I changed this to a daily dosing; if she needs to be on 2 pills of 50 mg I have switched this to Toprol-XL 100 mg daily. Could you please send over to appropriate pharmacy as I am unable to e-scribe this? Thank you

## 2016-02-25 NOTE — Telephone Encounter (Signed)
Pt. Called requesting an appointment for Thursday or Friday. Pt. Was given an appointment  With her PCP and she asked " who am I going to be seeing". "  I replied the she was going to see her PCP. She stated " I do not want to see her,  She messed up my medication. I need to see a new doctor. Pt. Was told that we  Did not have any appointments available until the end of the month. Pt. Stated she would See her PCP but wound have to make some call and hanged up the phone.

## 2016-02-28 ENCOUNTER — Ambulatory Visit: Payer: Self-pay | Attending: Family Medicine | Admitting: Family Medicine

## 2016-02-28 ENCOUNTER — Other Ambulatory Visit: Payer: Self-pay

## 2016-02-28 ENCOUNTER — Encounter: Payer: Self-pay | Admitting: Family Medicine

## 2016-02-28 DIAGNOSIS — E1140 Type 2 diabetes mellitus with diabetic neuropathy, unspecified: Secondary | ICD-10-CM

## 2016-02-28 DIAGNOSIS — R0789 Other chest pain: Secondary | ICD-10-CM

## 2016-02-28 DIAGNOSIS — Z79899 Other long term (current) drug therapy: Secondary | ICD-10-CM | POA: Insufficient documentation

## 2016-02-28 DIAGNOSIS — IMO0002 Reserved for concepts with insufficient information to code with codable children: Secondary | ICD-10-CM

## 2016-02-28 DIAGNOSIS — Z794 Long term (current) use of insulin: Secondary | ICD-10-CM | POA: Insufficient documentation

## 2016-02-28 DIAGNOSIS — I5031 Acute diastolic (congestive) heart failure: Secondary | ICD-10-CM | POA: Insufficient documentation

## 2016-02-28 DIAGNOSIS — E038 Other specified hypothyroidism: Secondary | ICD-10-CM

## 2016-02-28 DIAGNOSIS — E1165 Type 2 diabetes mellitus with hyperglycemia: Secondary | ICD-10-CM

## 2016-02-28 DIAGNOSIS — Z880 Allergy status to penicillin: Secondary | ICD-10-CM | POA: Insufficient documentation

## 2016-02-28 DIAGNOSIS — I5032 Chronic diastolic (congestive) heart failure: Secondary | ICD-10-CM

## 2016-02-28 DIAGNOSIS — R635 Abnormal weight gain: Secondary | ICD-10-CM

## 2016-02-28 DIAGNOSIS — E559 Vitamin D deficiency, unspecified: Secondary | ICD-10-CM | POA: Insufficient documentation

## 2016-02-28 DIAGNOSIS — E1169 Type 2 diabetes mellitus with other specified complication: Secondary | ICD-10-CM | POA: Insufficient documentation

## 2016-02-28 DIAGNOSIS — I10 Essential (primary) hypertension: Secondary | ICD-10-CM

## 2016-02-28 DIAGNOSIS — R079 Chest pain, unspecified: Secondary | ICD-10-CM | POA: Insufficient documentation

## 2016-02-28 DIAGNOSIS — I130 Hypertensive heart and chronic kidney disease with heart failure and stage 1 through stage 4 chronic kidney disease, or unspecified chronic kidney disease: Secondary | ICD-10-CM | POA: Insufficient documentation

## 2016-02-28 DIAGNOSIS — Z9889 Other specified postprocedural states: Secondary | ICD-10-CM | POA: Insufficient documentation

## 2016-02-28 DIAGNOSIS — R600 Localized edema: Secondary | ICD-10-CM

## 2016-02-28 DIAGNOSIS — E039 Hypothyroidism, unspecified: Secondary | ICD-10-CM | POA: Insufficient documentation

## 2016-02-28 LAB — POCT GLYCOSYLATED HEMOGLOBIN (HGB A1C): HEMOGLOBIN A1C: 7.5

## 2016-02-28 LAB — GLUCOSE, POCT (MANUAL RESULT ENTRY): POC Glucose: 369 mg/dl — AB (ref 70–99)

## 2016-02-28 MED ORDER — METOPROLOL SUCCINATE ER 200 MG PO TB24: 200.0000 mg | ORAL_TABLET | Freq: Every day | ORAL | Status: DC

## 2016-02-28 MED ORDER — LIRAGLUTIDE 18 MG/3ML ~~LOC~~ SOPN: PEN_INJECTOR | SUBCUTANEOUS | Status: DC

## 2016-02-28 MED ORDER — LISINOPRIL 40 MG PO TABS: 40.0000 mg | ORAL_TABLET | Freq: Every day | ORAL | Status: DC

## 2016-02-28 MED ORDER — METOPROLOL SUCCINATE ER 200 MG PO TB24: 100.0000 mg | ORAL_TABLET | Freq: Every day | ORAL | Status: DC

## 2016-02-28 MED FILL — METOPROLOL SUCC ER 200 MG T: 200 | 30 days supply | Qty: 30 | Fill #0

## 2016-02-28 MED FILL — LISINOPRIL 40 MG TABLET: 40 | 30 days supply | Qty: 30 | Fill #0

## 2016-02-28 MED FILL — ATORVASTATIN 80 MG TABLET: 80 | 30 days supply | Qty: 15 | Fill #0

## 2016-02-28 NOTE — Progress Notes (Signed)
Subjective:  [name], female    DOB: 1951-08-15  Age: 65 y.o. MRN: [id number]  CC: Follow-up; Shortness of Breath; and Edema   HPI Anita James is a 65 year old female with a history of type 2 diabetes mellitus (123456 7.5), diastolic CHF (EF 0000000 from 02/2012) hypothyroidism, hyperlipidemia, hypertension who comes into the clinic for a follow-up visit.  She complains of pedal edema for the last 1 month denies excessive sodium intake; also complains of gasping when she lies down to sleep. She has gained weight and this is concerning to her.  Complains of chest pain which is intermittent and described as "in between her breasts". Denies shortness of breath. Has been breaking out in a rash in her face which she describes as boils but with no purulent discharge and they resolve spontaneously. She has no lesions in other parts of her body.  Her blood pressure is elevated today and she informs me she was taking 2 lisinopril tablets previously and now was prescribed only one and has resorted to taking 2 of her 40 mg tablet. I have strongly discouraged this as she will be taking over at the maximum daily limit   Past Medical History  Diagnosis Date  . Diabetes mellitus type II, uncontrolled ([place])   . Hypertension   . Hypothyroidism   . Hiatal hernia   . Hyperlipemia   . Diverticulitis   . Pneumonia   . Anxiety   . Neuropathy ([place])   . Frequent headaches   . Chronic back pain   . Chronic kidney disease 03/2013    failure due to meds  . Chest pain   . Obesity   . Carpal tunnel syndrome 06/14/2014  . 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 Vitamin D deficiency 06/05/2014    Past Surgical History  Procedure Laterality Date  . Cesarean section    . Knee surgery Left   . Thyroid surgery      goiter    Allergies  Allergen Reactions  . Amoxicillin-Pot Clavulanate Nausea And Vomiting and Other (See Comments)    Combination of medications tear lining of stomach  . Sulfa  Antibiotics Shortness Of Breath  . Azithromycin      Outpatient Prescriptions Prior to Visit  Medication Sig Dispense Refill  . atorvastatin (LIPITOR) 40 MG tablet Take 1 tablet (40 mg total) by mouth daily. 30 tablet 3  . dapagliflozin propanediol (FARXIGA) 10 MG TABS tablet Take 10 mg by mouth daily. 90 tablet 3  . Dexlansoprazole (DEXILANT) 30 MG capsule Take 1 capsule (30 mg total) by mouth daily. 30 capsule 1  . gabapentin (NEURONTIN) 300 MG capsule Take 2 capsules (600 mg total) by mouth 2 (two) times daily. Patient needs to schedule appointment. 120 capsule 3  . insulin aspart (NOVOLOG) 100 UNIT/ML FlexPen Inject 30 Units into the skin 3 (three) times daily with meals. 15 mL 3  . insulin glargine (LANTUS) 100 UNIT/ML injection Inject 0.6 mLs (60 Units total) into the skin at bedtime. 6 vial 3  . levothyroxine (SYNTHROID, LEVOTHROID) 100 MCG tablet Take 1 tablet (100 mcg total) by mouth daily before breakfast. 90 tablet 1  . mometasone (NASONEX) 50 MCG/ACT nasal spray Place 2 sprays into the nose daily. 17 g 3  . Multiple Vitamins-Minerals (EYE VITAMINS) CAPS Take 1 capsule by mouth daily.    Marland Kitchen lisinopril (PRINIVIL,ZESTRIL) 40 MG tablet Take 1 tablet (40 mg total) by mouth daily. 30 tablet 3  . metoprolol succinate (TOPROL-XL) 100  MG 24 hr tablet Take 1 tablet (100 mg total) by mouth daily. Take with or immediately following a meal. 30 tablet 3  . celecoxib (CELEBREX) 200 MG capsule Take 1 capsule (200 mg total) by mouth daily. (Patient not taking: Reported on 02/28/2016) 30 capsule 0  . cyclobenzaprine (FLEXERIL) 5 MG tablet Take 1 tablet (5 mg total) by mouth 3 (three) times daily as needed for muscle spasms. (Patient not taking: Reported on 02/28/2016) 90 tablet 1   No facility-administered medications prior to visit.    ROS Review of Systems  Constitutional: Negative for activity change, appetite change and fatigue.  HENT: Negative for congestion, sinus pressure and sore throat.     Eyes: Negative for visual disturbance.  Respiratory: Negative for cough, chest tightness, shortness of breath and wheezing.   Cardiovascular: Positive for chest pain and leg swelling. Negative for palpitations.  Gastrointestinal: Negative for abdominal pain, constipation and abdominal distention.  Endocrine: Negative for polydipsia.  Genitourinary: Negative for dysuria and frequency.  Musculoskeletal: Negative for back pain and arthralgias.  Skin: Positive for rash.  Neurological: Negative for tremors, light-headedness and numbness.  Hematological: Does not bruise/bleed easily.  Psychiatric/Behavioral: Negative for behavioral problems and agitation.    Objective:  BP 153/88 mmHg  Pulse 111  Temp(Src) 98 F (36.7 C)  Ht 5\' 6"  (1.676 m)  Wt 262 lb 3.2 oz (118.933 kg)  BMI 42.34 kg/m2  SpO2 94%  BP/Weight 02/28/2016 01/13/2016 [id number]  Systolic BP 0000000 0000000 Q000111Q  Diastolic BP 88 81 79  Wt. (Lbs) 262.2 281.6 274  BMI 42.34 45.47 44.25      Physical Exam  Constitutional: She is oriented to person, place, and time. She appears well-developed and well-nourished.  Obese  Cardiovascular: Normal heart sounds and intact distal pulses.  Tachycardia present.   No murmur heard. Pulmonary/Chest: Effort normal and breath sounds normal. She has no wheezes. She has no rales. She exhibits no tenderness.  Abdominal: Soft. Bowel sounds are normal. She exhibits no distension and no mass. There is no tenderness.  Musculoskeletal: Normal range of motion. She exhibits edema (1+ non pitting bilaterally).  Neurological: She is alert and oriented to person, place, and time.  Skin:  Erythematous scars on face.     Assessment & Plan:   1. Type 2 diabetes, uncontrolled, with neuropathy (HCC) A1c is 7.5 She is on huge doses of NovoLog and is reluctant to any adjustment in her regimen. I have placed her on Victoza which will hopefully help with weight loss but this has to be ordered through patient  ass[facility]. We will see back at her next visit and give instructions on titrating down dose of NovoLog while increasing that of Victoza - Glucose (CBG) - POCT A1C - Liraglutide 18 MG/3ML SOPN; 0.6 mg subcutaneously daily for1 week, then 1.2 mg daily for 1 week, then 1.8 mg daily subsequently.  Dispense: 18 mL; Refill: 3  2. Weight gain Reduce portion sizes - Split night study; Future  3. Pedal edema Elevate feet and reduce sodium intake - Echocardiogram; Future  4. Other chest pain EKG performed today-unchanged from previous  5. Chronic diastolic congestive heart failure (HCC) EF of 55-60% from 02/2012 Oxygen saturation is only 94% but this is her baseline. If BNP is elevated I will place her on low-dose Lasix - Pro b natriuretic peptide - Echocardiogram; Future - Split night study; Future  6. Essential hypertension Uncontrolled Advised against self medication. Dose of metoprolol increased -  metoprolol succinate (TOPROL-XL) 200 MG 24 hr tablet; Take 0.5 tablets (100 mg total) by mouth daily. Take with or immediately following a meal.  Dispense: 30 tablet; Refill: 3 - lisinopril (PRINIVIL,ZESTRIL) 40 MG tablet; Take 1 tablet (40 mg total) by mouth daily.  Dispense: 30 tablet; Refill: 3  7. Other specified hypothyroidism controlled    Meds ordered this encounter  Medications  . Liraglutide 18 MG/3ML SOPN    Sig: 0.6 mg subcutaneously daily for1 week, then 1.2 mg daily for 1 week, then 1.8 mg daily subsequently.    Dispense:  18 mL    Refill:  3  . metoprolol succinate (TOPROL-XL) 200 MG 24 hr tablet    Sig: Take 0.5 tablets (100 mg total) by mouth daily. Take with or immediately following a meal.    Dispense:  30 tablet    Refill:  3    Discontinue previous dose  . lisinopril (PRINIVIL,ZESTRIL) 40 MG tablet    Sig: Take 1 tablet (40 mg total) by mouth daily.    Dispense:  30 tablet    Refill:  3    Follow-up: Return in about 1 month  (around 03/29/2016) for Follow-up of hypertension.   Arnoldo Morale MD

## 2016-03-02 ENCOUNTER — Telehealth: Payer: Self-pay | Admitting: Family Medicine

## 2016-03-02 DIAGNOSIS — I5032 Chronic diastolic (congestive) heart failure: Secondary | ICD-10-CM

## 2016-03-02 NOTE — Telephone Encounter (Signed)
Patient called requesting lab results, notified patient results are still in process, patient states she has been having SHOB and hacking cough. Please f/up

## 2016-03-04 ENCOUNTER — Other Ambulatory Visit (HOSPITAL_COMMUNITY): Payer: Self-pay

## 2016-03-04 LAB — PRO B NATRIURETIC PEPTIDE

## 2016-03-05 ENCOUNTER — Telehealth: Payer: Self-pay | Admitting: Family Medicine

## 2016-03-05 NOTE — Addendum Note (Signed)
Addended by: Arnoldo Morale on: 03/05/2016 12:36 PM   Modules accepted: Orders

## 2016-03-05 NOTE — Telephone Encounter (Signed)
Labs were cancelled because specimen was received in an inappropriate tube. She has the option of coming in to have it repeated or repeating it at her next visit.

## 2016-03-05 NOTE — Telephone Encounter (Signed)
Called pt. Pt verified name and date of birth. Pt notified that her lab draw on 02/28/16  was collected in the wrong tube and was unable to be processed by [facility] lab. Pt given the option to return at her convenience to get her lab repeated or get her labs repeated during her next appt. Pt voiced dissatisfaction with the information. Pt phone service was in and out and cut off. Tried to call patient back and reached voicemail. Left message for pt to return call at [phone].

## 2016-03-09 ENCOUNTER — Ambulatory Visit: Payer: Self-pay | Attending: Family Medicine

## 2016-03-09 DIAGNOSIS — I5032 Chronic diastolic (congestive) heart failure: Secondary | ICD-10-CM

## 2016-03-11 ENCOUNTER — Other Ambulatory Visit: Payer: Self-pay | Admitting: Family Medicine

## 2016-03-11 ENCOUNTER — Telehealth: Payer: Self-pay

## 2016-03-11 DIAGNOSIS — I5032 Chronic diastolic (congestive) heart failure: Secondary | ICD-10-CM

## 2016-03-11 LAB — PRO B NATRIURETIC PEPTIDE

## 2016-03-11 MED ORDER — FUROSEMIDE 20 MG PO TABS: 20.0000 mg | ORAL_TABLET | Freq: Every day | ORAL | Status: DC

## 2016-03-11 MED FILL — ?FUROSEMIDE 20 MG TABLET: 20 | 30 days supply | Qty: 30 | Fill #0

## 2016-03-11 NOTE — Telephone Encounter (Signed)
Please find out if specimen is still available and if so, could you place an order for a  BNP to be run.

## 2016-03-11 NOTE — Telephone Encounter (Signed)
Placed telephone call to [facility] spoke with Carletta states they would not be able to change lab to BNP because sample is considered unstable been longer than 24 hours. Dr. Brita Romp notified.   By Kristen Cardinal, RN

## 2016-03-11 NOTE — Progress Notes (Signed)
I have sent a prescription for Lasix in the event that it could help with dyspnea to the pharmacy due to the fact that her BNP has been canceled by the lab twice. She is due for follow-up appointment in the first or second week of July for follow-up of her 2-D echo. Please ensure that she has been scheduled for 2-D echo.Thank you.

## 2016-03-11 NOTE — Telephone Encounter (Signed)
Pt. Called stating she received a call from the [facility].  Please f/u with pt.

## 2016-03-11 NOTE — Telephone Encounter (Signed)
[facility] lab , [place] called stating that BNP was incorrectly drawn. Writer called patient who refused to have lab redrawn, patient hung up before writer was able to offer her other alternatives. Raina Mina, RN

## 2016-03-11 NOTE — Telephone Encounter (Signed)
Unsuccessful call placed to member RN attempted to advise:I have sent a prescription for Lasix in the event that it could help with dyspnea to the pharmacy due to the fact that her BNP has been canceled by the lab twice. She is due for follow-up appointment in the first or second week of July for follow-up of her 2-D echo. Please ensure that she has been scheduled for 2-D echo Priscille Heidelberg, RN, BSN

## 2016-03-12 NOTE — Progress Notes (Signed)
Writer scheduled the echo for 03/18/16 at 3:00 pm.  Writer called patient several times to inform her of the appt for the echo as well as inform her that MD prescribed and sent lasix to her pharmacy to hopefully help with her dyspnea.  Was unable to LVM, phone just continuously rings.

## 2016-03-12 NOTE — Progress Notes (Signed)
Writer called patient 4 more times and the phone just rings then disconnects.  Not able to LVM.

## 2016-03-13 NOTE — Progress Notes (Signed)
Writer sent patient a letter after many attempts to call her and not having the option to leave her a VM.  The letter was to remind her of her echo appt and informing her the MD sent a lasix prescription to her pharmacy.  Letter mailed out on Friday 03/13/16.

## 2016-03-18 ENCOUNTER — Ambulatory Visit: Payer: Self-pay | Attending: Family Medicine

## 2016-03-18 ENCOUNTER — Ambulatory Visit (HOSPITAL_COMMUNITY)
Admission: RE | Admit: 2016-03-18 | Discharge: 2016-03-18 | Disposition: A | Discharge status: Home / Self Care | Payer: Self-pay | Source: Ambulatory Visit | Attending: Family Medicine | Admitting: Family Medicine

## 2016-03-18 DIAGNOSIS — I059 Rheumatic mitral valve disease, unspecified: Secondary | ICD-10-CM | POA: Insufficient documentation

## 2016-03-18 DIAGNOSIS — E119 Type 2 diabetes mellitus without complications: Secondary | ICD-10-CM | POA: Insufficient documentation

## 2016-03-18 DIAGNOSIS — I110 Hypertensive heart disease with heart failure: Secondary | ICD-10-CM | POA: Insufficient documentation

## 2016-03-18 DIAGNOSIS — R600 Localized edema: Secondary | ICD-10-CM | POA: Insufficient documentation

## 2016-03-18 DIAGNOSIS — I509 Heart failure, unspecified: Secondary | ICD-10-CM

## 2016-03-18 DIAGNOSIS — I5032 Chronic diastolic (congestive) heart failure: Secondary | ICD-10-CM | POA: Insufficient documentation

## 2016-03-18 LAB — ECHOCARDIOGRAM COMPLETE
CHL CUP DOP CALC LVOT VTI: 29.2 cm
E decel time: 306 msec
EERAT: 13.03
FS: 36 % (ref 28–44)
IV/PV OW: 0.97
LA ID, A-P, ES: 36 mm
LA vol A4C: 54.8 ml
LADIAMINDEX: 1.61 cm/m2
LAVOL: 57.1 mL
LAVOLIN: 25.5 mL/m2
LEFT ATRIUM END SYS DIAM: 36 mm
LV E/e' medial: 13.03
LVEEAVG: 13.03
LVELAT: 5.77 cm/s
LVOT area: 3.8 cm2
LVOT peak grad rest: 10 mmHg
LVOT peak vel: 157 cm/s
LVOTD: 22 mm
LVOTSV: 111 mL
MV Dec: 306
MVPG: 2 mmHg
MVPKAVEL: 104 m/s
MVPKEVEL: 75.2 m/s
PW: 10.9 mm — AB (ref 0.6–1.1)
TAPSE: 23.4 mm
TDI e' lateral: 5.77
TDI e' medial: 5.98

## 2016-03-18 MED FILL — GABAPENTIN 300 MG CAPSULE: 300 | 30 days supply | Qty: 120 | Fill #1

## 2016-03-18 NOTE — Progress Notes (Signed)
Echocardiogram 2D Echocardiogram has been performed.  Anita James 03/18/2016, 3:34 PM

## 2016-03-19 LAB — BRAIN NATRIURETIC PEPTIDE: Brain Natriuretic Peptide: <4 pg/mL (ref <100)

## 2016-03-20 ENCOUNTER — Telehealth: Payer: Self-pay | Admitting: Family Medicine

## 2016-03-20 NOTE — Telephone Encounter (Signed)
Blood work is negative for acute heart failure. 2-D echo reveals a normal ejection fraction, no valvular problems, mild left ventricular hypertrophy.

## 2016-03-20 NOTE — Telephone Encounter (Signed)
Writer called patient and LVM requesting a call back to discuss lab and echo results.

## 2016-03-20 NOTE — Telephone Encounter (Signed)
Late entry 6:05pm: after review of chart patient advised unfortunately Zigmund Daniel RN is not available but review of chart shows we received lab received BNP results "normal" and message will be sent to Nurse that she called. Patient verbalized understanding.

## 2016-03-20 NOTE — Telephone Encounter (Signed)
Pt called stating she received call from nurse about results a few minutes prior Pt was informed that nurse was no longer in the office Pt became upset Pt was informed that she would receive a call Wednesday from a nurse to follow up

## 2016-03-25 NOTE — Telephone Encounter (Signed)
Spoke with patient this morning regarding echo and blood work being WNL.  Patient stated understanding however is still suffering from SOB  Patient to see Dr. Janne Napoleon on 04/06/16.

## 2016-03-27 MED FILL — METOPROLOL SUCC ER 200 MG T: 200 | 30 days supply | Qty: 30 | Fill #1

## 2016-03-27 MED FILL — ATORVASTATIN 80 MG TABLET: 80 | 30 days supply | Qty: 15 | Fill #1

## 2016-03-27 MED FILL — METOPROLOL SUCC ER 50 MG TA: 50 | 30 days supply | Qty: 30 | Fill #1

## 2016-03-27 MED FILL — LISINOPRIL 40 MG TABLET: 40 | 30 days supply | Qty: 30 | Fill #1

## 2016-04-01 ENCOUNTER — Ambulatory Visit (HOSPITAL_BASED_OUTPATIENT_CLINIC_OR_DEPARTMENT_OTHER): Payer: Self-pay | Attending: Family Medicine | Admitting: Internal Medicine

## 2016-04-01 VITALS — Ht 66.0 in | Wt 280.0 lb

## 2016-04-01 DIAGNOSIS — G4733 Obstructive sleep apnea (adult) (pediatric): Secondary | ICD-10-CM | POA: Insufficient documentation

## 2016-04-01 DIAGNOSIS — I5032 Chronic diastolic (congestive) heart failure: Secondary | ICD-10-CM | POA: Insufficient documentation

## 2016-04-01 DIAGNOSIS — Z794 Long term (current) use of insulin: Secondary | ICD-10-CM | POA: Insufficient documentation

## 2016-04-01 DIAGNOSIS — Z79899 Other long term (current) drug therapy: Secondary | ICD-10-CM | POA: Insufficient documentation

## 2016-04-01 DIAGNOSIS — R0683 Snoring: Secondary | ICD-10-CM | POA: Insufficient documentation

## 2016-04-01 DIAGNOSIS — R635 Abnormal weight gain: Secondary | ICD-10-CM | POA: Insufficient documentation

## 2016-04-01 DIAGNOSIS — G4736 Sleep related hypoventilation in conditions classified elsewhere: Secondary | ICD-10-CM | POA: Insufficient documentation

## 2016-04-03 ENCOUNTER — Ambulatory Visit: Payer: Self-pay | Admitting: Family Medicine

## 2016-04-06 ENCOUNTER — Encounter: Payer: Self-pay | Admitting: Internal Medicine

## 2016-04-06 ENCOUNTER — Ambulatory Visit: Payer: Self-pay | Attending: Internal Medicine | Admitting: Internal Medicine

## 2016-04-06 VITALS — BP 116/71 | HR 90 | Temp 97.9°F | Resp 16 | Ht 66.0 in | Wt 278.0 lb

## 2016-04-06 DIAGNOSIS — Z833 Family history of diabetes mellitus: Secondary | ICD-10-CM | POA: Insufficient documentation

## 2016-04-06 DIAGNOSIS — Z809 Family history of malignant neoplasm, unspecified: Secondary | ICD-10-CM | POA: Insufficient documentation

## 2016-04-06 DIAGNOSIS — I10 Essential (primary) hypertension: Secondary | ICD-10-CM

## 2016-04-06 DIAGNOSIS — I5032 Chronic diastolic (congestive) heart failure: Secondary | ICD-10-CM | POA: Insufficient documentation

## 2016-04-06 DIAGNOSIS — E038 Other specified hypothyroidism: Secondary | ICD-10-CM

## 2016-04-06 DIAGNOSIS — E1140 Type 2 diabetes mellitus with diabetic neuropathy, unspecified: Secondary | ICD-10-CM

## 2016-04-06 DIAGNOSIS — I130 Hypertensive heart and chronic kidney disease with heart failure and stage 1 through stage 4 chronic kidney disease, or unspecified chronic kidney disease: Secondary | ICD-10-CM | POA: Insufficient documentation

## 2016-04-06 DIAGNOSIS — E1165 Type 2 diabetes mellitus with hyperglycemia: Secondary | ICD-10-CM

## 2016-04-06 DIAGNOSIS — IMO0002 Reserved for concepts with insufficient information to code with codable children: Secondary | ICD-10-CM

## 2016-04-06 DIAGNOSIS — J302 Other seasonal allergic rhinitis: Secondary | ICD-10-CM

## 2016-04-06 DIAGNOSIS — E039 Hypothyroidism, unspecified: Secondary | ICD-10-CM | POA: Insufficient documentation

## 2016-04-06 DIAGNOSIS — E1122 Type 2 diabetes mellitus with diabetic chronic kidney disease: Secondary | ICD-10-CM | POA: Insufficient documentation

## 2016-04-06 DIAGNOSIS — E785 Hyperlipidemia, unspecified: Secondary | ICD-10-CM | POA: Insufficient documentation

## 2016-04-06 DIAGNOSIS — Z79899 Other long term (current) drug therapy: Secondary | ICD-10-CM | POA: Insufficient documentation

## 2016-04-06 DIAGNOSIS — Z794 Long term (current) use of insulin: Secondary | ICD-10-CM | POA: Insufficient documentation

## 2016-04-06 DIAGNOSIS — Z8249 Family history of ischemic heart disease and other diseases of the circulatory system: Secondary | ICD-10-CM | POA: Insufficient documentation

## 2016-04-06 LAB — GLUCOSE, POCT (MANUAL RESULT ENTRY): POC GLUCOSE: 381 mg/dL — AB (ref 70–99)

## 2016-04-06 LAB — TSH: TSH: 1.15 m[IU]/L

## 2016-04-06 MED ORDER — LISINOPRIL 40 MG PO TABS: 40.0000 mg | ORAL_TABLET | Freq: Every day | ORAL | Status: DC

## 2016-04-06 MED ORDER — LEVOTHYROXINE SODIUM 100 MCG PO TABS: 100.0000 ug | ORAL_TABLET | Freq: Every day | ORAL | Status: DC

## 2016-04-06 MED ORDER — LORATADINE 10 MG PO TABS: 10.0000 mg | ORAL_TABLET | Freq: Every day | ORAL | Status: DC

## 2016-04-06 MED ORDER — ATORVASTATIN CALCIUM 40 MG PO TABS: 40.0000 mg | ORAL_TABLET | Freq: Every day | ORAL | Status: DC

## 2016-04-06 NOTE — Addendum Note (Signed)
Addended by: Lottie Mussel T on: 04/06/2016 04:39 PM   Modules accepted: Orders

## 2016-04-06 NOTE — Patient Instructions (Signed)
DASH Eating Plan DASH stands for "Dietary Approaches to Stop Hypertension." The DASH eating plan is a healthy eating plan that has been shown to reduce high blood pressure (hypertension). Additional health benefits may include reducing the risk of type 2 diabetes mellitus, heart disease, and stroke. The DASH eating plan may also help with weight loss. WHAT DO I NEED TO KNOW ABOUT THE DASH EATING PLAN? For the DASH eating plan, you will follow these general guidelines:  Choose foods with a percent daily value for sodium of less than 5% (as listed on the food label).  Use salt-free seasonings or herbs instead of table salt or sea salt.  Check with your health care provider or pharmacist before using salt substitutes.  Eat lower-sodium products, often labeled as "lower sodium" or "no salt added."  Eat fresh foods.  Eat more vegetables, fruits, and low-fat dairy products.  Choose whole grains. Look for the word "whole" as the first word in the ingredient list.  Choose fish and skinless chicken or turkey more often than red meat. Limit fish, poultry, and meat to 6 oz (170 g) each day.  Limit sweets, desserts, sugars, and sugary drinks.  Choose heart-healthy fats.  Limit cheese to 1 oz (28 g) per day.  Eat more home-cooked food and less restaurant, buffet, and fast food.  Limit fried foods.  Cook foods using methods other than frying.  Limit canned vegetables. If you do use them, rinse them well to decrease the sodium.  When eating at a restaurant, ask that your food be prepared with less salt, or no salt if possible. WHAT FOODS CAN I EAT? Seek help from a dietitian for individual calorie needs. Grains Whole grain or whole wheat bread. Brown rice. Whole grain or whole wheat pasta. Quinoa, bulgur, and whole grain cereals. Low-sodium cereals. Corn or whole wheat flour tortillas. Whole grain cornbread. Whole grain crackers. Low-sodium crackers. Vegetables Fresh or frozen vegetables  (raw, steamed, roasted, or grilled). Low-sodium or reduced-sodium tomato and vegetable juices. Low-sodium or reduced-sodium tomato sauce and paste. Low-sodium or reduced-sodium canned vegetables.  Fruits All fresh, canned (in natural juice), or frozen fruits. Meat and Other Protein Products Ground beef (85% or leaner), grass-fed beef, or beef trimmed of fat. Skinless chicken or turkey. Ground chicken or turkey. Pork trimmed of fat. All fish and seafood. Eggs. Dried beans, peas, or lentils. Unsalted nuts and seeds. Unsalted canned beans. Dairy Low-fat dairy products, such as skim or 1% milk, 2% or reduced-fat cheeses, low-fat ricotta or cottage cheese, or plain low-fat yogurt. Low-sodium or reduced-sodium cheeses. Fats and Oils Tub margarines without trans fats. Light or reduced-fat mayonnaise and salad dressings (reduced sodium). Avocado. Safflower, olive, or canola oils. Natural peanut or almond butter. Other Unsalted popcorn and pretzels. The items listed above may not be a complete list of recommended foods or beverages. Contact your dietitian for more options. WHAT FOODS ARE NOT RECOMMENDED? Grains White bread. White pasta. White rice. Refined cornbread. Bagels and croissants. Crackers that contain trans fat. Vegetables Creamed or fried vegetables. Vegetables in a cheese sauce. Regular canned vegetables. Regular canned tomato sauce and paste. Regular tomato and vegetable juices. Fruits Dried fruits. Canned fruit in light or heavy syrup. Fruit juice. Meat and Other Protein Products Fatty cuts of meat. Ribs, chicken wings, bacon, sausage, bologna, salami, chitterlings, fatback, hot dogs, bratwurst, and packaged luncheon meats. Salted nuts and seeds. Canned beans with salt. Dairy Whole or 2% milk, cream, half-and-half, and cream cheese. Whole-fat or sweetened yogurt. Full-fat   cheeses or blue cheese. Nondairy creamers and whipped toppings. Processed cheese, cheese spreads, or cheese  curds. Condiments Onion and garlic salt, seasoned salt, table salt, and sea salt. Canned and packaged gravies. Worcestershire sauce. Tartar sauce. Barbecue sauce. Teriyaki sauce. Soy sauce, including reduced sodium. Steak sauce. Fish sauce. Oyster sauce. Cocktail sauce. Horseradish. Ketchup and mustard. Meat flavorings and tenderizers. Bouillon cubes. Hot sauce. Tabasco sauce. Marinades. Taco seasonings. Relishes. Fats and Oils Butter, stick margarine, lard, shortening, ghee, and bacon fat. Coconut, palm kernel, or palm oils. Regular salad dressings. Other Pickles and olives. Salted popcorn and pretzels. The items listed above may not be a complete list of foods and beverages to avoid. Contact your dietitian for more information. WHERE CAN I FIND MORE INFORMATION? National Heart, Lung, and Blood Institute: travelstabloid.com   This information is not intended to replace advice given to you by your health care provider. Make sure you discuss any questions you have with your health care provider.   Document Released: 08/27/2011 Document Revised: 09/28/2014 Document Reviewed: 07/12/2013 Elsevier Interactive Patient Education 2016 [employer]  - Diabetes Mellitus and Food It is important for you to manage your blood sugar (glucose) level. Your blood glucose level can be greatly affected by what you eat. Eating healthier foods in the appropriate amounts throughout the day at about the same time each day will help you control your blood glucose level. It can also help slow or prevent worsening of your diabetes mellitus. Healthy eating may even help you improve the level of your blood pressure and reach or maintain a healthy weight.  General recommendations for healthful eating and cooking habits include:  Eating meals and snacks regularly. Avoid going long periods of time without eating to lose weight.  Eating a diet that consists mainly of plant-based foods, such  as fruits, vegetables, nuts, legumes, and whole grains.  Using low-heat cooking methods, such as baking, instead of high-heat cooking methods, such as deep frying. Work with your dietitian to make sure you understand how to use the Nutrition Facts information on food labels. HOW CAN FOOD AFFECT ME? Carbohydrates Carbohydrates affect your blood glucose level more than any other type of food. Your dietitian will help you determine how many carbohydrates to eat at each meal and teach you how to count carbohydrates. Counting carbohydrates is important to keep your blood glucose at a healthy level, especially if you are using insulin or taking certain medicines for diabetes mellitus. Alcohol Alcohol can cause sudden decreases in blood glucose (hypoglycemia), especially if you use insulin or take certain medicines for diabetes mellitus. Hypoglycemia can be a life-threatening condition. Symptoms of hypoglycemia (sleepiness, dizziness, and disorientation) are similar to symptoms of having too much alcohol.  If your health care provider has given you approval to drink alcohol, do so in moderation and use the following guidelines:  Women should not have more than one drink per day, and men should not have more than two drinks per day. One drink is equal to:  12 oz of beer.  5 oz of wine.  1 oz of hard liquor.  Do not drink on an empty stomach.  Keep yourself hydrated. Have water, diet soda, or unsweetened iced tea.  Regular soda, juice, and other mixers might contain a lot of carbohydrates and should be counted. WHAT FOODS ARE NOT RECOMMENDED? As you make food choices, it is important to remember that all foods are not the same. Some foods have fewer nutrients per serving than other foods, even  though they might have the same number of calories or carbohydrates. It is difficult to get your body what it needs when you eat foods with fewer nutrients. Examples of foods that you should avoid that are  high in calories and carbohydrates but low in nutrients include:  Trans fats (most processed foods list trans fats on the Nutrition Facts label).  Regular soda.  Juice.  Candy.  Sweets, such as cake, pie, doughnuts, and cookies.  Fried foods. WHAT FOODS CAN I EAT? Eat nutrient-rich foods, which will nourish your body and keep you healthy. The food you should eat also will depend on several factors, including:  The calories you need.  The medicines you take.  Your weight.  Your blood glucose level.  Your blood pressure level.  Your cholesterol level. You should eat a variety of foods, including:  Protein.  Lean cuts of meat.  Proteins low in saturated fats, such as fish, egg whites, and beans. Avoid processed meats.  Fruits and vegetables.  Fruits and vegetables that may help control blood glucose levels, such as apples, mangoes, and yams.  Dairy products.  Choose fat-free or low-fat dairy products, such as milk, yogurt, and cheese.  Grains, bread, pasta, and rice.  Choose whole grain products, such as multigrain bread, whole oats, and brown rice. These foods may help control blood pressure.  Fats.  Foods containing healthful fats, such as nuts, avocado, olive oil, canola oil, and fish. DOES EVERYONE WITH DIABETES MELLITUS HAVE THE SAME MEAL PLAN? Because every person with diabetes mellitus is different, there is not one meal plan that works for everyone. It is very important that you meet with a dietitian who will help you create a meal plan that is just right for you.   This information is not intended to replace advice given to you by your health care provider. Make sure you discuss any questions you have with your health care provider.   Document Released: 06/04/2005 Document Revised: 09/28/2014 Document Reviewed: 08/04/2013 Elsevier Interactive Patient Education 2016 [place] for Eating Away From Home If You Have Diabetes Controlling your  level of blood glucose, also known as blood sugar, can be challenging. It can be even more difficult when you do not prepare your own meals. The following tips can help you manage your diabetes when you eat away from home. PLANNING AHEAD Plan ahead if you know you will be eating away from home:  Ask your health care provider how to time meals and medicine if you are taking insulin.  Make a list of restaurants near you that offer healthy choices. If they have a carry-out menu, take it home and plan what you will order ahead of time.  Look up the restaurant you want to eat at online. Many chain and fast-food restaurants list nutritional information online. Use this information to choose the healthiest options and to calculate how many carbohydrates will be in your meal.  Use a carbohydrate-counting book or mobile app to look up the carbohydrate content and serving size of the foods you want to eat.  Become familiar with serving sizes and learn to recognize how many servings are in a portion. This will allow you to estimate how many carbohydrates you can eat. FREE FOODS A "free food" is any food or drink that has less than 5 g of carbohydrates per serving. Free foods include:  Many vegetables.  Hard boiled eggs.  Nuts or seeds.  Olives.  Cheeses.  Meats. These types  of foods make good appetizer choices and are often available at salad bars. Lemon juice, vinegar, or a low-calorie salad dressing of fewer than 20 calories per serving can be used as a "free" salad dressing.  CHOICES TO REDUCE CARBOHYDRATES  Substitute nonfat sweetened yogurt with a sugar-free yogurt. Yogurt made from soy milk may also be used, but you will still want a sugar-free or plain option to choose a lower carbohydrate amount.  Ask your server to take away the bread basket or chips from your table.  Order fresh fruit. A salad bar often offers fresh fruit choices. Avoid canned fruit because it is usually packed in  sugar or syrup.  Order a salad, and eat it without dressing. Or, create a "free" salad dressing.  Ask for substitutions. For example, instead of [place] fries, request an order of a vegetable such as salad, green beans, or broccoli. OTHER TIPS   If you take insulin, take the insulin once your food arrives to your table. This will ensure your insulin and food are timed correctly.  Ask your server about the portion size before your order, and ask for a take-out box if the portion has more servings than you should have. When your food comes, leave the amount you should have on the plate, and put the rest in the take-out box.  Consider splitting an entree with someone and ordering a side salad.   This information is not intended to replace advice given to you by your health care provider. Make sure you discuss any questions you have with your health care provider.   Document Released: 09/07/2005 Document Revised: 05/29/2015 Document Reviewed: 12/05/2013 Elsevier Interactive Patient Education 2016 [employer].  - Diabetes and Exercise Exercising regularly is important. It is not just about losing weight. It has many health benefits, such as:  Improving your overall fitness, flexibility, and endurance.  Increasing your bone density.  Helping with weight control.  Decreasing your body fat.  Increasing your muscle strength.  Reducing stress and tension.  Improving your overall health. People with diabetes who exercise gain additional benefits because exercise:  Reduces appetite.  Improves the body's use of blood sugar (glucose).  Helps lower or control blood glucose.  Decreases blood pressure.  Helps control blood lipids (such as cholesterol and triglycerides).  Improves the body's use of the hormone insulin by:  Increasing the body's insulin sensitivity.  Reducing the body's insulin needs.  Decreases the risk for heart disease because exercising:  Lowers cholesterol  and triglycerides levels.  Increases the levels of good cholesterol (such as high-density lipoproteins [HDL]) in the body.  Lowers blood glucose levels. YOUR ACTIVITY PLAN  Choose an activity that you enjoy, and set realistic goals. To exercise safely, you should begin practicing any new physical activity slowly, and gradually increase the intensity of the exercise over time. Your health care provider or diabetes educator can help create an activity plan that works for you. General recommendations include:  Encouraging children to engage in at least 60 minutes of physical activity each day.  Stretching and performing strength training exercises, such as yoga or weight lifting, at least 2 times per week.  Performing a total of at least 150 minutes of moderate-intensity exercise each week, such as brisk walking or water aerobics.  Exercising at least 3 days per week, making sure you allow no more than 2 consecutive days to pass without exercising.  Avoiding long periods of inactivity (90 minutes or more). When you have to spend  an extended period of time sitting down, take frequent breaks to walk or stretch. RECOMMENDATIONS FOR EXERCISING WITH TYPE 1 OR TYPE 2 DIABETES   Check your blood glucose before exercising. If blood glucose levels are greater than 240 mg/dL, check for urine ketones. Do not exercise if ketones are present.  Avoid injecting insulin into areas of the body that are going to be exercised. For example, avoid injecting insulin into:  The arms when playing tennis.  The legs when jogging.  Keep a record of:  Food intake before and after you exercise.  Expected peak times of insulin action.  Blood glucose levels before and after you exercise.  The type and amount of exercise you have done.  Review your records with your health care provider. Your health care provider will help you to develop guidelines for adjusting food intake and insulin amounts before and after  exercising.  If you take insulin or oral hypoglycemic agents, watch for signs and symptoms of hypoglycemia. They include:  Dizziness.  Shaking.  Sweating.  Chills.  Confusion.  Drink plenty of water while you exercise to prevent dehydration or heat stroke. Body water is lost during exercise and must be replaced.  Talk to your health care provider before starting an exercise program to make sure it is safe for you. Remember, almost any type of activity is better than none.   This information is not intended to replace advice given to you by your health care provider. Make sure you discuss any questions you have with your health care provider.   Document Released: 11/28/2003 Document Revised: 01/22/2015 Document Reviewed: 02/14/2013 Elsevier Interactive Patient Education [employer].

## 2016-04-06 NOTE — Progress Notes (Signed)
Anita James, is a 65 y.o. female  [id number]  [id number]  DOB - [birth year]  CC:  Chief Complaint  Patient presents with  . Establish Care       HPI: Anita James is a 65 y.o. female here today to establish medical care, last seen in clinic oon 02/28/16 for peripheral edema, started on 1 month lasix at that time, now sp sleep study last week as well (results pending).  Pt just had fast food for lunch, glud 381, did not take her insulin prior.  She is waiting for health dept to approve Victoza as well.  Per pt, diet compliance sometimes, does include fast food/fried foods at times.  C.o of more nasal congestion/runny nose lately, using nasonex.  Per pt, had colonoscopy perhaps <10 years ago, she will try to get report.  Per pt, had pneumococal vaccine in past as well, does not recall when.  [name], [name], [name], [name], [name]  Here today w/ her grown twin dgts and grandkids.   Review of Systems: Per HPI, o/w all systems reviewed and negative.  Allergies  Allergen Reactions  . Amoxicillin-Pot Clavulanate Nausea And Vomiting and Other (See Comments)    Combination of medications tear lining of stomach  . Sulfa Antibiotics Shortness Of Breath  . Azithromycin    Past Medical History  Diagnosis Date  . Diabetes mellitus type II, uncontrolled ([place])   . Hypertension   . Hypothyroidism   . Hiatal hernia   . Hyperlipemia   . Diverticulitis   . Pneumonia   . Anxiety   . Neuropathy ([place])   . Frequent headaches   . Chronic back pain   . Chronic kidney disease 03/2013    failure due to meds  . Chest pain   . Obesity   . Carpal tunnel syndrome 06/14/2014  . 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 Vitamin D deficiency 06/05/2014   Current Outpatient Prescriptions on File Prior to Visit  Medication Sig Dispense Refill  . cyclobenzaprine (FLEXERIL) 5 MG tablet Take 1 tablet (5 mg total)  by mouth 3 (three) times daily as needed for muscle spasms. 90 tablet 1  . dapagliflozin propanediol (FARXIGA) 10 MG TABS tablet Take 10 mg by mouth daily. 90 tablet 3  . Dexlansoprazole (DEXILANT) 30 MG capsule Take 1 capsule (30 mg total) by mouth daily. 30 capsule 1  . gabapentin (NEURONTIN) 300 MG capsule Take 2 capsules (600 mg total) by mouth 2 (two) times daily. Patient needs to schedule appointment. 120 capsule 3  . insulin aspart (NOVOLOG) 100 UNIT/ML FlexPen Inject 30 Units into the skin 3 (three) times daily with meals. 15 mL 3  . insulin glargine (LANTUS) 100 UNIT/ML injection Inject 0.6 mLs (60 Units total) into the skin at bedtime. 6 vial 3  . metoprolol (TOPROL-XL) 200 MG 24 hr tablet Take 1 tablet (200 mg total) by mouth daily. Take with or immediately following a meal. 30 tablet 3  . mometasone (NASONEX) 50 MCG/ACT nasal spray Place 2 sprays into the nose daily. 17 g 3  . Multiple Vitamins-Minerals (EYE VITAMINS) CAPS Take 1 capsule by mouth daily.    . Liraglutide 18 MG/3ML SOPN 0.6 mg subcutaneously daily for1 week, then 1.2 mg daily for 1 week, then 1.8 mg daily subsequently. (Patient not taking: Reported on 04/06/2016) 18 mL 3  . [DISCONTINUED] fenofibrate (TRICOR) 145 MG tablet Take 1 tablet (  145 mg total) by mouth daily. 30 tablet 2  . [DISCONTINUED] insulin detemir (LEVEMIR) 100 UNIT/ML injection Inject 60 Units into the skin at bedtime. 10 mL 1  . [DISCONTINUED] metoprolol (LOPRESSOR) 50 MG tablet Take 1 tablet (50 mg total) by mouth 2 (two) times daily. 60 tablet 2   No current facility-administered medications on file prior to visit.   Family History  Problem Relation Age of Onset  . Cancer Mother     cervical- mets  . Cancer Father   . Heart disease Father   . Diabetes Father   . Diabetes Sister   . Heart disease Sister   . Heart disease Sister   . COPD Sister    Social History   Social History  . Marital Status: Divorced    Spouse Name: N/A  . Number of  Children: 6  . Years of Education: BA   Occupational History  . unemployed    Social History Main Topics  . Smoking status: Never Smoker   . Smokeless tobacco: Never Used  . Alcohol Use: No  . Drug Use: No  . Sexual Activity: Not on file   Other Topics Concern  . Not on file   Social History Narrative    Objective:   Filed Vitals:   04/06/16 1403  BP: 116/71  Pulse: 90  Temp: 97.9 F (36.6 C)  Resp: 16    Filed Weights   04/06/16 1403  Weight: 278 lb (126.1 kg)    BP Readings from Last 3 Encounters:  04/06/16 116/71  02/28/16 153/88  01/13/16 137/81    Physical Exam: Constitutional: Patient appears well-developed and well-nourished. No distress. AAOx3 HENT: Normocephalic, atraumatic, External right and left ear normal. Oropharynx is clear and moist.  Eyes: Conjunctivae and EOM are normal. PERRL, no scleral icterus. Neck: Normal ROM. Neck supple. No JVD.  CVS: RRR, S1/S2 +, no murmurs, no gallops, no carotid bruit.  Pulmonary: Effort and breath sounds normal, no stridor, rhonchi, wheezes, rales.  Abdominal: Soft. BS +, no distension, tenderness, rebound or guarding.  Musculoskeletal: Normal range of motion. No edema and no tenderness.  LE: bilat/ no c/c/e, pulses 2+ bilateral. Neuro: Alert.  muscle tone coordination wnl. No cranial nerve deficit grossly. Skin: Skin is warm and dry. No rash noted. Not diaphoretic. No erythema. No pallor. Psychiatric: Normal mood and affect. Behavior, judgment, thought content normal.  Lab Results  Component Value Date   WBC 5.3 12/23/2013   HGB 14.2 12/23/2013   HCT 40.4 12/23/2013   MCV 87.1 12/23/2013   PLT 189 12/23/2013   Lab Results  Component Value Date   CREATININE 0.79 01/13/2016   BUN 10 01/13/2016   NA 144 01/13/2016   K 4.2 01/13/2016   CL 105 01/13/2016   CO2 28 01/13/2016    Lab Results  Component Value Date   HGBA1C 7.5 02/28/2016   Lipid Panel     Component Value Date/Time   CHOL 202*  01/13/2016 1457   TRIG 261* 01/13/2016 1457   HDL 38* 01/13/2016 1457   CHOLHDL 5.3* 01/13/2016 1457   VLDL 52* 01/13/2016 1457   LDLCALC 112 01/13/2016 1457       Depression screen PHQ 2/9 04/06/2016 02/28/2016 01/13/2016 09/10/2015 06/04/2015  Decreased Interest 1 3 3  0 0  Down, Depressed, Hopeless 1 3 3  0 0  PHQ - 2 Score 2 6 6  0 0  Altered sleeping 3 3 3  - -  Tired, decreased energy 1 3 3  - -  Change in appetite 1 3 2  - -  Feeling bad or failure about yourself  3 3 3  - -  Trouble concentrating 1 3 1  - -  Moving slowly or fidgety/restless 0 3 1 - -  Suicidal thoughts 0 3 0 - -  PHQ-9 Score 11 27 19  - -  Difficult doing work/chores - Extremely dIfficult - - -    Assessment and plan:   1. Type 2 diabetes, uncontrolled, with neuropathy (HCC) - cbg 381, just ate fast food., did not do short acting insulin prior or after meal. - continue lantus and novulog, encouragd to take as instructec. - pt still working on getting Victozia, thinks farziga caused weight gain   2. Chronic diastolic congestive heart failure (HCC) No more lasix for now, euvolemic appearing, DASH diet recd, low salt, less fried foods.  3. Essential hypertension Controlled, DASH diet discussed - lisinopril (PRINIVIL,ZESTRIL) 40 MG tablet; Take 1 tablet (40 mg total) by mouth daily.  Dispense: 90 tablet; Refill: 3 - BASIC METABOLIC PANEL WITH GFR   4.  hypothyroidism - levothyroxine (SYNTHROID, LEVOTHROID) 100 MCG tablet; Take 1 tablet (100 mcg total) by mouth daily before breakfast.  Dispense: 90 tablet; Refill: 1 - TSH  5. Hyperlipemia - atorvastatin (LIPITOR) 40 MG tablet; Take 1 tablet (40 mg total) by mouth daily.  Dispense: 90 tablet; Refill: 3   6. Possible OSA - per pt, sleep study dx w/ OSA, waiting for report, will refer to [facility] when see results.   7. Morbid obesity - recd increase activity and exercise.  Return in about 4 weeks (around 05/04/2016) for papsmear when available as well.  The  patient was given clear instructions to go to ER or return to medical center if symptoms don't improve, worsen or new problems develop. The patient verbalized understanding. The patient was told to call to get lab results if they haven't heard anything in the next week.    This note has been created with Surveyor, quantity. Any transcriptional errors are unintentional.   Maren Reamer, MD, [place], [place]   04/06/2016, 2:34 PM

## 2016-04-06 NOTE — Addendum Note (Signed)
Addended by: Kristen Cardinal on: 04/06/2016 03:45 PM   Modules accepted: Orders

## 2016-04-07 ENCOUNTER — Telehealth: Payer: Self-pay | Admitting: Internal Medicine

## 2016-04-07 ENCOUNTER — Other Ambulatory Visit: Payer: Self-pay | Admitting: Internal Medicine

## 2016-04-07 DIAGNOSIS — N183 Chronic kidney disease, stage 3 (moderate): Principal | ICD-10-CM

## 2016-04-07 DIAGNOSIS — E1122 Type 2 diabetes mellitus with diabetic chronic kidney disease: Secondary | ICD-10-CM

## 2016-04-07 LAB — BASIC METABOLIC PANEL WITH GFR
BUN: 22 mg/dL (ref 7–25)
CHLORIDE: 97 mmol/L — AB (ref 98–110)
CO2: 25 mmol/L (ref 20–31)
CREATININE: 1.24 mg/dL — AB (ref 0.50–0.99)
Calcium: 9.3 mg/dL (ref 8.6–10.4)
GFR, Est African American: 54 mL/min — ABNORMAL LOW (ref >=60)
GFR, Est Non African American: 47 mL/min — ABNORMAL LOW (ref >=60)
Glucose, Bld: 397 mg/dL — ABNORMAL HIGH (ref 65–99)
POTASSIUM: 4.7 mmol/L (ref 3.5–5.3)
SODIUM: 134 mmol/L — AB (ref 135–146)

## 2016-04-07 MED ORDER — CIPROFLOXACIN HCL 250 MG PO TABS: 250.0000 mg | ORAL_TABLET | Freq: Two times a day (BID) | ORAL | Status: DC

## 2016-04-07 NOTE — Telephone Encounter (Signed)
Attempted to call pt at all 3 nmbs (obtained the cell phn number by talking to Marshell Garfinkel), finally got hold of someone (not certain who).  But I was told pt is busy and we can call back with labs results later.

## 2016-04-07 NOTE — Telephone Encounter (Signed)
Patient returned doctors phone for lab results. Patient is also requesting an antibiotic for congestion. It's okay to leave voicemail.

## 2016-04-07 NOTE — Telephone Encounter (Signed)
Called pactient back and talked to her about results.  She is insistent that she typically eats well and watches her sugars. C/o of acute sinus problems w/ nasal /sinus congestion, couldn't sleep well last night as well has ha.  rx cipro 250mg  po bid x 5 days Recd tighter glucose control, fluid restrict to <2l /day. Trial otc biotin for dry mouth. Dc farxiga for now. amb ref to dentition.  Recd keep records of her sugars so we can adjust meds.  Close f/u.

## 2016-04-07 NOTE — Telephone Encounter (Signed)
Patient called requesting an abx and decongestive ,pt states she is worse than yesterday. Has nasal congestion. Please f/up

## 2016-04-08 MED FILL — CIPROFLOXACIN HCL 250 MG TA: 250 | 5 days supply | Qty: 10 | Fill #0

## 2016-04-08 NOTE — Telephone Encounter (Signed)
lv message that rx was at [facility]

## 2016-04-11 DIAGNOSIS — R635 Abnormal weight gain: Secondary | ICD-10-CM

## 2016-04-11 DIAGNOSIS — I5032 Chronic diastolic (congestive) heart failure: Secondary | ICD-10-CM

## 2016-04-11 NOTE — Procedures (Signed)
  Patient Name: Anita James, [name] Date: 04/01/2016 Gender: Female D.O.B: 12/19/1950 Age (years): 32 Referring Provider: Arnoldo Morale Height (inches): 66 Interpreting Physician: Baird Lyons MD, ABSM Weight (lbs): 280 RPSGT: Gerhard Perches BMI: 45 MRN: [id number] Neck Size: 17.00 CLINICAL INFORMATION Sleep Study Type: NPSG Indication for sleep study: Congestive Heart Failure Epworth Sleepiness Score: 15  SLEEP STUDY TECHNIQUE As per the AASM Manual for the Scoring of Sleep and Associated Events v2.3 (April 2016) with a hypopnea requiring 4% desaturations. The channels recorded and monitored were frontal, central and occipital EEG, electrooculogram (EOG), submentalis EMG (chin), nasal and oral airflow, thoracic and abdominal wall motion, anterior tibialis EMG, snore microphone, electrocardiogram, and pulse oximetry.  MEDICATIONS Patient's medications include: charted for review Medications self-administered by patient during sleep study : . METOPROLOL, GABAPENTIN, EXTRA STRENGTH TYLENOL, INSULIN GLARGINE.  SLEEP ARCHITECTURE The study was initiated at 10:59:40 PM and ended at 5:04:13 AM. Sleep onset time was 0.0 minutes and the sleep efficiency was 91.9%. The total sleep time was 335.1 minutes. Stage REM latency was 128.3 minutes. The patient spent 9.79% of the night in stage N1 sleep, 64.99% in stage N2 sleep, 15.22% in stage N3 and 10.00% in REM. Alpha intrusion was absent. Supine sleep was 38.10%.  RESPIRATORY PARAMETERS The overall apnea/hypopnea index (AHI) was 13.1 per hour. There were 17 total apneas, including 13 obstructive, 4 central and 0 mixed apneas. There were 56 hypopneas and 14 RERAs. The AHI during Stage REM sleep was 21.5 per hour. AHI while supine was 5.6 per hour. The mean oxygen saturation was 91.00%. The minimum SpO2 during sleep was 73.00%. Soft snoring was noted during this study.  CARDIAC DATA The 2 lead EKG demonstrated sinus rhythm. The mean  heart rate was 76.21 beats per minute. Other EKG findings include: None.  LEG MOVEMENT DATA The total PLMS were 8 with a resulting PLMS index of 1.43. Associated arousal with leg movement index was 0.0 .  IMPRESSIONS - Mild obstructive sleep apnea occurred during this study (AHI = 13.1/h). - There were not enough early events to meet protocol requirements for split CPAP titration. - No significant central sleep apnea occurred during this study (CAI = 0.7/h). - Moderate oxygen desaturation was noted during this study (Min O2 = 73.00%). - The patient snored with Soft snoring volume. - No cardiac abnormalities were noted during this study. - Clinically significant periodic limb movements did not occur during sleep. No significant associated arousals.  DIAGNOSIS - Obstructive Sleep Apnea (327.23 [G47.33 ICD-10]) - Nocturnal Hypoxemia (327.26 [G47.36 ICD-10])  RECOMMENDATIONS - Therapeutic CPAP titration to determine optimal pressure required to alleviate sleep disordered breathing. - Avoid alcohol, sedatives and other CNS depressants that may worsen sleep apnea and disrupt normal sleep architecture. - Sleep hygiene should be reviewed to assess factors that may improve sleep quality. - Weight management and regular exercise should be initiated or continued if appropriate.  [Electronically signed] 04/11/2016 12:11 PM  Baird Lyons MD, [place], American Board of Sleep Medicine   NPI: [id number]  [place], American Board of Sleep Medicine  ELECTRONICALLY SIGNED ON:  04/11/2016, 12:07 PM [place] PH: [phone]   FX: [phone] [place]

## 2016-04-13 ENCOUNTER — Other Ambulatory Visit: Payer: Self-pay | Admitting: Internal Medicine

## 2016-04-13 ENCOUNTER — Telehealth: Payer: Self-pay | Admitting: Internal Medicine

## 2016-04-13 MED ORDER — GUAIFENESIN-CODEINE 100-10 MG/5ML PO SOLN: 5.0000 mL | Freq: Four times a day (QID) | ORAL | 0 refills | Status: DC | PRN

## 2016-04-13 NOTE — Telephone Encounter (Signed)
Robitussin rx, will give to CMA to call for pt pick up.

## 2016-04-13 NOTE — Telephone Encounter (Signed)
Pt states she has been coughing non stop   Pt would like a Rx for decongestive cough medication with codeine  Pt states she spoke with pharmacy last week about this request and has not been followed up with  Please contact pt when/if this Rx has been placed Thank you

## 2016-04-13 NOTE — Telephone Encounter (Signed)
Will forward to pcp

## 2016-04-14 MED FILL — GABAPENTIN 300 MG CAPSULE: 300 | 30 days supply | Qty: 120 | Fill #2

## 2016-04-14 NOTE — Telephone Encounter (Signed)
Pt is aware rx is ready for pick up. 

## 2016-04-15 ENCOUNTER — Other Ambulatory Visit: Payer: Self-pay | Admitting: Family Medicine

## 2016-04-15 DIAGNOSIS — G4733 Obstructive sleep apnea (adult) (pediatric): Secondary | ICD-10-CM

## 2016-04-17 ENCOUNTER — Telehealth: Payer: Self-pay

## 2016-04-17 MED FILL — GUAIFENESIN AC COUGH SYRUP: 100-10 | 6 days supply | Qty: 120 | Fill #0

## 2016-04-17 NOTE — Telephone Encounter (Signed)
error 

## 2016-04-20 ENCOUNTER — Telehealth: Payer: Self-pay

## 2016-04-20 NOTE — Telephone Encounter (Signed)
Writer called patient last week to let her know that Dr. Jarold Song checked the results of her sleep study and it revealed that patient has a mild obstructive sleep disorder; CPAP titration was recommended and Dr. Jarold Song placed the order. Patient stated that Dr. Jarold Song is no longer her PCP and asked that Dr. Janne Napoleon and her nurse be notified.  She is requesting a call from MD's nurse.  In basket routed to [place] regarding this situation.  Patient removed from Dr. Johna Sheriff in basket per Dr. Johna Sheriff request. Patient stated understanding of this situation and does not wish to communicate with writer or Dr. Jarold Song in the future.

## 2016-04-22 ENCOUNTER — Telehealth: Payer: Self-pay

## 2016-04-22 NOTE — Telephone Encounter (Signed)
[facility] they informed me that they contacted pt yesterday to schedule pt did not answer they lvm for pt to give them a call back I contacted pt but pt did not answer lvm regarding that the [facility] contacted her yesteday to schedule but had to lvm if she could return their call to schedule also if she has any questions or concerns to give me a call at her convenience

## 2016-05-04 ENCOUNTER — Other Ambulatory Visit: Payer: Self-pay | Admitting: Pharmacist

## 2016-05-04 MED ORDER — MOMETASONE FUROATE 50 MCG/ACT NA SUSP: 2.0000 | Freq: Every day | NASAL | 3 refills | Status: DC

## 2016-05-06 MED FILL — ?LISINOPRIL 40 MG TABLET: 40 MG | 30 days supply | Qty: 30 | Fill #1

## 2016-05-06 MED FILL — METOPROLOL SUCC ER 50 MG TA: 50 | 30 days supply | Qty: 30 | Fill #2

## 2016-05-06 MED FILL — ATORVASTATIN 80 MG TABLET: 80 | 30 days supply | Qty: 15 | Fill #2

## 2016-05-14 MED FILL — METOPROLOL SUCC ER 200 MG T: 200 | 30 days supply | Qty: 30 | Fill #2

## 2016-05-28 ENCOUNTER — Encounter: Payer: Self-pay | Admitting: Internal Medicine

## 2016-05-28 ENCOUNTER — Ambulatory Visit: Payer: Self-pay | Attending: Internal Medicine | Admitting: Internal Medicine

## 2016-05-28 VITALS — BP 133/72 | HR 81 | Temp 98.1°F | Resp 18 | Ht 66.0 in | Wt 278.6 lb

## 2016-05-28 DIAGNOSIS — I129 Hypertensive chronic kidney disease with stage 1 through stage 4 chronic kidney disease, or unspecified chronic kidney disease: Secondary | ICD-10-CM | POA: Insufficient documentation

## 2016-05-28 DIAGNOSIS — E038 Other specified hypothyroidism: Secondary | ICD-10-CM

## 2016-05-28 DIAGNOSIS — K219 Gastro-esophageal reflux disease without esophagitis: Secondary | ICD-10-CM | POA: Insufficient documentation

## 2016-05-28 DIAGNOSIS — Z881 Allergy status to other antibiotic agents status: Secondary | ICD-10-CM | POA: Insufficient documentation

## 2016-05-28 DIAGNOSIS — Z6841 Body Mass Index (BMI) 40.0 and over, adult: Secondary | ICD-10-CM | POA: Insufficient documentation

## 2016-05-28 DIAGNOSIS — G5600 Carpal tunnel syndrome, unspecified upper limb: Secondary | ICD-10-CM | POA: Insufficient documentation

## 2016-05-28 DIAGNOSIS — E1165 Type 2 diabetes mellitus with hyperglycemia: Secondary | ICD-10-CM | POA: Insufficient documentation

## 2016-05-28 DIAGNOSIS — IMO0002 Reserved for concepts with insufficient information to code with codable children: Secondary | ICD-10-CM

## 2016-05-28 DIAGNOSIS — Z794 Long term (current) use of insulin: Secondary | ICD-10-CM | POA: Insufficient documentation

## 2016-05-28 DIAGNOSIS — N189 Chronic kidney disease, unspecified: Secondary | ICD-10-CM | POA: Insufficient documentation

## 2016-05-28 DIAGNOSIS — J209 Acute bronchitis, unspecified: Secondary | ICD-10-CM | POA: Insufficient documentation

## 2016-05-28 DIAGNOSIS — E559 Vitamin D deficiency, unspecified: Secondary | ICD-10-CM | POA: Insufficient documentation

## 2016-05-28 DIAGNOSIS — E1140 Type 2 diabetes mellitus with diabetic neuropathy, unspecified: Secondary | ICD-10-CM | POA: Insufficient documentation

## 2016-05-28 DIAGNOSIS — Z882 Allergy status to sulfonamides status: Secondary | ICD-10-CM | POA: Insufficient documentation

## 2016-05-28 DIAGNOSIS — F419 Anxiety disorder, unspecified: Secondary | ICD-10-CM | POA: Insufficient documentation

## 2016-05-28 DIAGNOSIS — E785 Hyperlipidemia, unspecified: Secondary | ICD-10-CM | POA: Insufficient documentation

## 2016-05-28 DIAGNOSIS — Z880 Allergy status to penicillin: Secondary | ICD-10-CM | POA: Insufficient documentation

## 2016-05-28 DIAGNOSIS — K449 Diaphragmatic hernia without obstruction or gangrene: Secondary | ICD-10-CM | POA: Insufficient documentation

## 2016-05-28 DIAGNOSIS — E1122 Type 2 diabetes mellitus with diabetic chronic kidney disease: Secondary | ICD-10-CM | POA: Insufficient documentation

## 2016-05-28 DIAGNOSIS — E039 Hypothyroidism, unspecified: Secondary | ICD-10-CM | POA: Insufficient documentation

## 2016-05-28 MED ORDER — INSULIN GLARGINE 100 UNIT/ML ~~LOC~~ SOLN: 60.0000 [IU] | Freq: Every day | SUBCUTANEOUS | 3 refills | Status: DC

## 2016-05-28 MED ORDER — LEVOFLOXACIN 500 MG PO TABS: 500.0000 mg | ORAL_TABLET | Freq: Every day | ORAL | 0 refills | Status: DC

## 2016-05-28 MED ORDER — CETIRIZINE HCL 10 MG PO CAPS: 1.0000 | ORAL_CAPSULE | Freq: Every day | ORAL | 0 refills | Status: DC

## 2016-05-28 MED ORDER — DEXLANSOPRAZOLE 30 MG PO CPDR: 30.0000 mg | DELAYED_RELEASE_CAPSULE | Freq: Every day | ORAL | 1 refills | Status: DC

## 2016-05-28 MED ORDER — INSULIN ASPART 100 UNIT/ML FLEXPEN: 30.0000 [IU] | PEN_INJECTOR | Freq: Three times a day (TID) | SUBCUTANEOUS | 3 refills | Status: DC

## 2016-05-28 MED ORDER — LEVOTHYROXINE SODIUM 100 MCG PO TABS: 100.0000 ug | ORAL_TABLET | Freq: Every day | ORAL | 1 refills | Status: DC

## 2016-05-28 MED ORDER — GUAIFENESIN ER 600 MG PO TB12: 600.0000 mg | ORAL_TABLET | Freq: Two times a day (BID) | ORAL | 1 refills | Status: DC | PRN

## 2016-05-28 MED FILL — ?CETIRIZINE HCL 10 MG TABLE: 10 | 30 days supply | Qty: 30 | Fill #0

## 2016-05-28 MED FILL — levoFLOXacin 500 MG TABS: 500 | 5 days supply | Qty: 5 | Fill #0

## 2016-05-28 NOTE — Progress Notes (Signed)
Anita James, is a 65 y.o. female  [id number]  [id number]  DOB - October 24, 1950  Chief Complaint  [name]        Subjective:   Anita James is a 65 y.o. female here today for a follow up visit, w/ pmhx of htn, dm, morbid obesity, pw increasing cough/chest/nasal congestion since Thursday.  +pleritic pain w/ cough.  +productive of thick green sputum.  Does not smoke.  Low grade subjective fevers at home, loo 100s.  Unable to sleep last night due to persistent cough.  Little appetite last few days.  This am, ate 1/2 banana, but did not give herself any insulin.   [name], [name], [name], [name], [name]  No problems updated.  ALLERGIES: Allergies  Allergen Reactions  . Amoxicillin-Pot Clavulanate Nausea And Vomiting and Other (See Comments)    Combination of medications tear lining of stomach  . Sulfa Antibiotics Shortness Of Breath  . Azithromycin     PAST MEDICAL HISTORY: Past Medical History:  Diagnosis Date  . Anxiety   . Carpal tunnel syndrome 06/14/2014  . Chest pain   . Chronic back pain   . Chronic kidney disease 03/2013   failure due to meds  . Diabetes mellitus type II, uncontrolled ([place])   . Diverticulitis   . Frequent headaches   . Hiatal hernia   . Hyperlipemia   . Hypertension   . Hypothyroidism   . 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 Neuropathy ([place])   . Obesity   . Pneumonia   . Vitamin D deficiency 06/05/2014    MEDICATIONS AT HOME: Prior to Admission medications   Medication Sig Start Date End Date Taking? Authorizing Provider  atorvastatin (LIPITOR) 40 MG tablet Take 1 tablet (40 mg total) by mouth daily. 04/06/16  Yes Luzmaria Devaux Lazarus Gowda, MD  cyclobenzaprine (FLEXERIL) 5 MG tablet Take 1 tablet (5 mg total) by mouth 3 (three) times daily as needed for muscle spasms. 01/13/16  Yes Arnoldo Morale, MD  Dexlansoprazole (DEXILANT) 30 MG  capsule Take 1 capsule (30 mg total) by mouth daily. 05/28/16  Yes Maren Reamer, MD  gabapentin (NEURONTIN) 300 MG capsule Take 2 capsules (600 mg total) by mouth 2 (two) times daily. Patient needs to schedule appointment. 01/13/16  Yes Arnoldo Morale, MD  insulin aspart (NOVOLOG) 100 UNIT/ML FlexPen Inject 30 Units into the skin 3 (three) times daily with meals. 05/28/16  Yes Maren Reamer, MD  insulin glargine (LANTUS) 100 UNIT/ML injection Inject 0.6 mLs (60 Units total) into the skin at bedtime. 05/28/16  Yes Maren Reamer, MD  levothyroxine (SYNTHROID, LEVOTHROID) 100 MCG tablet Take 1 tablet (100 mcg total) by mouth daily before breakfast. 05/28/16  Yes Maren Reamer, MD  Liraglutide 18 MG/3ML SOPN 0.6 mg subcutaneously daily for1 week, then 1.2 mg daily for 1 week, then 1.8 mg daily subsequently. 02/28/16  Yes Arnoldo Morale, MD  lisinopril (PRINIVIL,ZESTRIL) 40 MG tablet Take 1 tablet (40 mg total) by mouth daily. 04/06/16  Yes Maren Reamer, MD  loratadine (CLARITIN) 10 MG tablet Take 1 tablet (10 mg total) by mouth daily. 04/06/16  Yes Maren Reamer, MD  metoprolol (TOPROL-XL) 200 MG 24 hr tablet Take 1 tablet (200 mg total) by mouth daily. Take with or immediately following a meal. 02/28/16  Yes Arnoldo Morale, MD  mometasone (NASONEX) 50 MCG/ACT nasal spray Place  2 sprays into the nose daily. 05/04/16  Yes Maren Reamer, MD  Multiple Vitamins-Minerals (EYE VITAMINS) CAPS Take 1 capsule by mouth daily. 08/11/12  Yes Thurnell Lose, MD  Cetirizine HCl 10 MG CAPS Take 1 capsule (10 mg total) by mouth daily. 05/28/16   Maren Reamer, MD  guaiFENesin (MUCINEX) 600 MG 12 hr tablet Take 1 tablet (600 mg total) by mouth 2 (two) times daily as needed. 05/28/16   Maren Reamer, MD  levofloxacin (LEVAQUIN) 500 MG tablet Take 1 tablet (500 mg total) by mouth daily. 05/28/16   Maren Reamer, MD     Objective:   Vitals:   05/28/16 1057  BP: 133/72  Pulse: 81  Resp: 18  Temp: 98.1 F (36.7  C)  TempSrc: Oral  SpO2: 99%  Weight: 278 lb 9.6 oz (126.4 kg)  Height: 5\' 6"  (1.676 m)    Exam General appearance : Awake, alert, not in any distress. Speech Clear. Not toxic looking, morbid obese HEENT: Atraumatic and Normocephalic, pupils equally reactive to light. bilat TMs clear.  Boggy naires bilat.  Throat clear. Neck: supple, no JVD.  Chest:Good air entry bilaterally, no added sounds. CVS: S1 S2 regular, no murmurs/gallups or rubs. Abdomen: Bowel sounds active, obese, Non tender Extremities: B/L Lower Ext shows trace edema, both legs are warm to touch Neurology: Awake alert, and oriented X 3, CN II-XII grossly intact, Non focal Skin:No Rash  Data Review Lab Results  Component Value Date   HGBA1C 7.5 02/28/2016   HGBA1C 7.4 01/13/2016   HGBA1C 7.20 09/10/2015    Depression screen PHQ 2/9 04/06/2016 02/28/2016 01/13/2016 09/10/2015 06/04/2015  Decreased Interest 1 3 3  0 0  Down, Depressed, Hopeless 1 3 3  0 0  PHQ - 2 Score 2 6 6  0 0  Altered sleeping 3 3 3  - -  Tired, decreased energy 1 3 3  - -  Change in appetite 1 3 2  - -  Feeling bad or failure about yourself  3 3 3  - -  Trouble concentrating 1 3 1  - -  Moving slowly or fidgety/restless 0 3 1 - -  Suicidal thoughts 0 3 0 - -  PHQ-9 Score 11 27 19  - -  Difficult doing work/chores - Extremely dIfficult - - -      Assessment & Plan   1. Type 2 diabetes, uncontrolled, with neuropathy (HCC) -uncontrolled, did not take her short acting novolog this am when ate banana. dw pt some fruits have a lot of carbs, including bananas and tangerines, need to take at least 5-10 units of novolog if not eating much at time except bananas, etc. - insulin glargine (LANTUS) 100 UNIT/ML injection; Inject 0.6 mLs (60 Units total) into the skin at bedtime.  Dispense: 6 vial; Refill: 3 - insulin aspart (NOVOLOG) 100 UNIT/ML FlexPen; Inject 30 Units into the skin 3 (three) times daily with meals.  Dispense: 15 mL; Refill: 3  2. Other  specified hypothyroidism - levothyroxine (SYNTHROID, LEVOTHROID) 100 MCG tablet; Take 1 tablet (100 mcg total) by mouth daily before breakfast.  Dispense: 90 tablet; Refill: 1  3. Gastroesophageal reflux disease without esophagitis - Dexlansoprazole (DEXILANT) 30 MG capsule; Take 1 capsule (30 mg total) by mouth daily.  Dispense: 30 capsule; Refill: 1  4. Acute bronchitis, unspecified organism Suspect more viral than bacterial, but going on since last Thurs, almost a week, still sx, given her high risk of dm, will emp trx w/ levofloxacin 500 daily x 5 days. -  mucinex, and zyrtec started as well. - recd nasal inhaler /flonase to naires bid as well, pt states she has at home.     Patient have been counseled extensively about nutrition and exercise  Return in about 2 months (around 07/28/2016), or if symptoms worsen or fail to improve.  The patient was given clear instructions to go to ER or return to medical center if symptoms don't improve, worsen or new problems develop. The patient verbalized understanding. The patient was told to call to get lab results if they haven't heard anything in the next week.   This note has been created with Surveyor, quantity. Any transcriptional errors are unintentional.   Maren Reamer, MD, [place] and [facility] [place], [place]   05/28/2016, 11:17 AM

## 2016-05-28 NOTE — Progress Notes (Signed)
Patient is here for FU  Patient request refills on insulin and levothyroxine. She uses [facility] for these medications.

## 2016-06-02 ENCOUNTER — Telehealth: Payer: Self-pay | Admitting: Internal Medicine

## 2016-06-02 NOTE — Telephone Encounter (Signed)
Can you check w/ pt on which rx went to health dept? thx

## 2016-06-02 NOTE — Telephone Encounter (Signed)
[facility] called states rx that were sent over are illegible, would like new rx sent over .please f/up

## 2016-06-02 NOTE — Telephone Encounter (Signed)
Will forward to pcp

## 2016-06-03 NOTE — Telephone Encounter (Signed)
Lvm for pt to give me a call back at her earliest convenience

## 2016-06-04 MED FILL — ?LISINOPRIL 40 MG TABLET: 40 MG | 30 days supply | Qty: 30 | Fill #2

## 2016-06-05 MED FILL — GABAPENTIN 300 MG CAPSULE: 300 | 30 days supply | Qty: 120 | Fill #3

## 2016-06-05 MED FILL — METOPROLOL SUCC ER 200 MG T: 200 | 30 days supply | Qty: 30 | Fill #3

## 2016-06-05 NOTE — Telephone Encounter (Signed)
Contacted pt on both numbers provided pt did not answer lvm on first number but second number the phone just kept ringing

## 2016-06-16 ENCOUNTER — Other Ambulatory Visit: Payer: Self-pay | Admitting: Pharmacist

## 2016-06-16 DIAGNOSIS — E1165 Type 2 diabetes mellitus with hyperglycemia: Principal | ICD-10-CM

## 2016-06-16 DIAGNOSIS — IMO0002 Reserved for concepts with insufficient information to code with codable children: Secondary | ICD-10-CM

## 2016-06-16 DIAGNOSIS — E1140 Type 2 diabetes mellitus with diabetic neuropathy, unspecified: Secondary | ICD-10-CM

## 2016-06-16 MED ORDER — INSULIN GLARGINE 100 UNIT/ML ~~LOC~~ SOLN: 60.0000 [IU] | Freq: Every day | SUBCUTANEOUS | 3 refills | Status: DC

## 2016-06-24 ENCOUNTER — Other Ambulatory Visit: Payer: Self-pay | Admitting: Pharmacist

## 2016-06-24 MED ORDER — INSULIN GLARGINE 100 UNIT/ML SOLOSTAR PEN: 60.0000 [IU] | PEN_INJECTOR | Freq: Every day | SUBCUTANEOUS | 3 refills | Status: DC

## 2016-07-01 ENCOUNTER — Other Ambulatory Visit: Payer: Self-pay | Admitting: Pharmacist

## 2016-07-01 DIAGNOSIS — E1140 Type 2 diabetes mellitus with diabetic neuropathy, unspecified: Secondary | ICD-10-CM

## 2016-07-01 DIAGNOSIS — IMO0002 Reserved for concepts with insufficient information to code with codable children: Secondary | ICD-10-CM

## 2016-07-01 DIAGNOSIS — E1165 Type 2 diabetes mellitus with hyperglycemia: Principal | ICD-10-CM

## 2016-07-01 MED ORDER — INSULIN ASPART 100 UNIT/ML FLEXPEN: 30.0000 [IU] | PEN_INJECTOR | Freq: Three times a day (TID) | SUBCUTANEOUS | 3 refills | Status: DC

## 2016-07-03 ENCOUNTER — Ambulatory Visit: Payer: Self-pay | Attending: Internal Medicine

## 2016-07-03 MED FILL — METOPROLOL SUCC ER 50 MG TA: 50 | 30 days supply | Qty: 30 | Fill #3

## 2016-07-03 MED FILL — GABAPENTIN 300 MG CAPSULE: 300 | 30 days supply | Qty: 120 | Fill #0

## 2016-07-03 MED FILL — LISINOPRIL 40 MG TABLET: 40 | 30 days supply | Qty: 30 | Fill #3

## 2016-07-03 MED FILL — ATORVASTATIN 40 MG TABLET: 40 | 30 days supply | Qty: 30 | Fill #0

## 2016-07-08 ENCOUNTER — Other Ambulatory Visit: Payer: Self-pay | Admitting: Family Medicine

## 2016-07-08 DIAGNOSIS — I10 Essential (primary) hypertension: Secondary | ICD-10-CM

## 2016-07-08 MED FILL — METOPROLOL SUCC ER 200 MG T: 200 | 30 days supply | Qty: 30 | Fill #0

## 2016-07-13 ENCOUNTER — Other Ambulatory Visit: Payer: Self-pay

## 2016-07-13 DIAGNOSIS — E038 Other specified hypothyroidism: Secondary | ICD-10-CM

## 2016-07-13 DIAGNOSIS — E1165 Type 2 diabetes mellitus with hyperglycemia: Principal | ICD-10-CM

## 2016-07-13 DIAGNOSIS — E1140 Type 2 diabetes mellitus with diabetic neuropathy, unspecified: Secondary | ICD-10-CM

## 2016-07-13 DIAGNOSIS — IMO0002 Reserved for concepts with insufficient information to code with codable children: Secondary | ICD-10-CM

## 2016-07-13 DIAGNOSIS — K219 Gastro-esophageal reflux disease without esophagitis: Secondary | ICD-10-CM

## 2016-07-13 MED ORDER — LIRAGLUTIDE 18 MG/3ML ~~LOC~~ SOPN: PEN_INJECTOR | SUBCUTANEOUS | 3 refills | Status: DC

## 2016-07-13 MED ORDER — INSULIN GLARGINE 100 UNIT/ML SOLOSTAR PEN: 60.0000 [IU] | PEN_INJECTOR | Freq: Every day | SUBCUTANEOUS | 3 refills | Status: DC

## 2016-07-13 MED ORDER — LEVOTHYROXINE SODIUM 100 MCG PO TABS
100.0000 ug | ORAL_TABLET | Freq: Every day | ORAL | 1 refills | Status: DC
Start: 2016-07-13 — End: 2016-08-17

## 2016-07-13 MED ORDER — DEXLANSOPRAZOLE 30 MG PO CPDR: 30.0000 mg | DELAYED_RELEASE_CAPSULE | Freq: Every day | ORAL | 1 refills | Status: DC

## 2016-07-13 MED ORDER — INSULIN ASPART 100 UNIT/ML FLEXPEN: 30.0000 [IU] | PEN_INJECTOR | Freq: Three times a day (TID) | SUBCUTANEOUS | 3 refills | Status: DC

## 2016-07-29 ENCOUNTER — Encounter (HOSPITAL_COMMUNITY): Payer: Self-pay | Admitting: Family Medicine

## 2016-07-29 ENCOUNTER — Emergency Department (HOSPITAL_COMMUNITY)
Admission: EM | Admit: 2016-07-29 | Discharge: 2016-07-29 | Disposition: A | Discharge status: Home / Self Care | Payer: Self-pay | Attending: Emergency Medicine | Admitting: Emergency Medicine

## 2016-07-29 ENCOUNTER — Emergency Department (HOSPITAL_COMMUNITY): Payer: Self-pay

## 2016-07-29 DIAGNOSIS — M25552 Pain in left hip: Secondary | ICD-10-CM | POA: Insufficient documentation

## 2016-07-29 DIAGNOSIS — M25562 Pain in left knee: Secondary | ICD-10-CM | POA: Insufficient documentation

## 2016-07-29 DIAGNOSIS — I130 Hypertensive heart and chronic kidney disease with heart failure and stage 1 through stage 4 chronic kidney disease, or unspecified chronic kidney disease: Secondary | ICD-10-CM | POA: Insufficient documentation

## 2016-07-29 DIAGNOSIS — Z794 Long term (current) use of insulin: Secondary | ICD-10-CM | POA: Insufficient documentation

## 2016-07-29 DIAGNOSIS — Z79899 Other long term (current) drug therapy: Secondary | ICD-10-CM | POA: Insufficient documentation

## 2016-07-29 DIAGNOSIS — I5030 Unspecified diastolic (congestive) heart failure: Secondary | ICD-10-CM | POA: Insufficient documentation

## 2016-07-29 DIAGNOSIS — E1140 Type 2 diabetes mellitus with diabetic neuropathy, unspecified: Secondary | ICD-10-CM | POA: Insufficient documentation

## 2016-07-29 DIAGNOSIS — N189 Chronic kidney disease, unspecified: Secondary | ICD-10-CM | POA: Insufficient documentation

## 2016-07-29 DIAGNOSIS — E039 Hypothyroidism, unspecified: Secondary | ICD-10-CM | POA: Insufficient documentation

## 2016-07-29 HISTORY — DX: Heart failure, unspecified: I50.9

## 2016-07-29 MED ORDER — DICLOFENAC SODIUM 1 % TD GEL: 4.0000 g | Freq: Four times a day (QID) | TRANSDERMAL | 0 refills | Status: DC | PRN

## 2016-07-29 MED ORDER — HYDROMORPHONE HCL 1 MG/ML IJ SOLN
1.0000 mg | Freq: Once | INTRAMUSCULAR | Status: AC
  Administered 2016-07-29: 1 mg via INTRAMUSCULAR
  Filled 2016-07-29: qty 1

## 2016-07-29 NOTE — Discharge Instructions (Signed)
You have been seen today for knee and hip pain. Your imaging showed no acute abnormalities. Follow-up with orthopedics as soon as possible on this matter. Continue with Tylenol for pain management. May also try the diclofenac applied directly to the painful area.

## 2016-07-29 NOTE — ED Provider Notes (Signed)
[facility] DEPT Provider Note   CSN: [id number] Arrival date & time: 07/29/16  1610  By signing my name below, I, Soijett Blue, attest that this documentation has been prepared under the direction and in the presence of Shawn Joy, PA-C Electronically Signed: Soijett Blue, ED Scribe. 07/29/16. 5:23 PM.   History   Chief Complaint Chief Complaint  Patient presents with  . Knee Pain  . Hip Pain    HPI Anita James is a 65 y.o. female with a PMHx of DM, HTN, who presents to the Emergency Department complaining of left knee pain onset 2-3 days ago. Pt notes that she stood from a seated position when she heard a "pop" to her left hip. Pt reports that her left hip still feels in place currrently, did not feel as if it popped out of place at any time, and there is now a throbbing sensation to her left hip. Pt denies any left hip pain or left knee pain in the past, but reports that she fell onto her left knee while in high school. Pt is having associated symptoms of gait problem due to pain and left hip pain. She notes that she has tried tylenol, ice, and elevation with no relief of her symptoms. Denies falls/trauma, neuro deficits, joint swelling, or any other complaints. Pt notes that her doctor informed her to ceased taking aleve or ibuprofen due to her hx of DM and adds she can not take oral narcotic analgesics because they make her violently ill.    Pt denies having an orthopedist at this time, but notes that she had 2 arthroscopic surgeries completed to her right knee in the past.   The history is provided by the patient. No language interpreter was used.    Past Medical History:  Diagnosis Date  . Anxiety   . Carpal tunnel syndrome 06/14/2014  . Chest pain   . CHF (congestive heart failure) ([place])   . Chronic back pain   . Chronic kidney disease 03/2013   failure due to meds  . Diabetes mellitus type II, uncontrolled ([place])   . Diverticulitis   . Frequent headaches   . Hiatal  hernia   . Hyperlipemia   . Hypertension   . Hypothyroidism   . 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 Neuropathy ([place])   . Obesity   . Pneumonia   . Vitamin D deficiency 06/05/2014    Patient Active Problem List   Diagnosis Date Noted  . Vaginal yeast infection 01/15/2015  . Carpal tunnel syndrome 06/14/2014  . 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 Disturbance of skin sensation 06/08/2014  . Cervical radiculopathy at C8 06/05/2014  . Hyperlipemia 09/12/2013  . Diastolic CHF ([place]) 123XX123  . Major depression 09/12/2013  . Allergic rhinitis 06/22/2013  . Chronic pain syndrome 05/31/2013  . GERD (gastroesophageal reflux disease) 08/30/2012  . Hypertension   . Hypothyroidism   . Type 2 diabetes, uncontrolled, with neuropathy [facility])     Past Surgical History:  Procedure Laterality Date  . CESAREAN SECTION    . KNEE SURGERY Left   . THYROID SURGERY     goiter    OB History    Gravida Para Term Preterm AB Living   4 3 2 1 1 6    SAB TAB Ectopic Multiple Live Births       1 2 5        Home Medications    Prior to Admission medications   Medication Sig Start Date End Date  Taking? Authorizing Provider  atorvastatin (LIPITOR) 40 MG tablet Take 1 tablet (40 mg total) by mouth daily. 04/06/16   Maren Reamer, MD  Cetirizine HCl 10 MG CAPS Take 1 capsule (10 mg total) by mouth daily. 05/28/16   Maren Reamer, MD  cyclobenzaprine (FLEXERIL) 5 MG tablet Take 1 tablet (5 mg total) by mouth 3 (three) times daily as needed for muscle spasms. 01/13/16   Arnoldo Morale, MD  Dexlansoprazole (DEXILANT) 30 MG capsule Take 1 capsule (30 mg total) by mouth daily. 07/13/16   Maren Reamer, MD  diclofenac sodium (VOLTAREN) 1 % GEL Apply 4 g topically 4 (four) times daily as needed (Pain). 07/29/16   Shawn C Joy, PA-C  gabapentin (NEURONTIN) 300 MG capsule Take 2 capsules (600 mg total) by mouth 2 (two) times daily. Patient needs to schedule appointment. 01/13/16   Arnoldo Morale, MD  guaiFENesin  (MUCINEX) 600 MG 12 hr tablet Take 1 tablet (600 mg total) by mouth 2 (two) times daily as needed. 05/28/16   Maren Reamer, MD  insulin aspart (NOVOLOG) 100 UNIT/ML FlexPen Inject 30 Units into the skin 3 (three) times daily with meals. 07/13/16   Maren Reamer, MD  Insulin Glargine (LANTUS SOLOSTAR) 100 UNIT/ML Solostar Pen Inject 60 Units into the skin daily at 10 pm. 07/13/16   Maren Reamer, MD  levofloxacin (LEVAQUIN) 500 MG tablet Take 1 tablet (500 mg total) by mouth daily. 05/28/16   Maren Reamer, MD  levothyroxine (SYNTHROID, LEVOTHROID) 100 MCG tablet Take 1 tablet (100 mcg total) by mouth daily before breakfast. 07/13/16   Maren Reamer, MD  liraglutide 18 MG/3ML SOPN 0.6 mg subcutaneously daily for1 week, then 1.2 mg daily for 1 week, then 1.8 mg daily subsequently. 07/13/16   Maren Reamer, MD  lisinopril (PRINIVIL,ZESTRIL) 40 MG tablet Take 1 tablet (40 mg total) by mouth daily. 04/06/16   Maren Reamer, MD  loratadine (CLARITIN) 10 MG tablet Take 1 tablet (10 mg total) by mouth daily. 04/06/16   Maren Reamer, MD  metoprolol (TOPROL-XL) 200 MG 24 hr tablet TAKE 1 TABLET BY MOUTH DAILY. TAKE WITH OR IMMEDIATELY FOLLOWING A MEAL. 07/08/16   Arnoldo Morale, MD  mometasone (NASONEX) 50 MCG/ACT nasal spray Place 2 sprays into the nose daily. 05/04/16   Maren Reamer, MD  Multiple Vitamins-Minerals (EYE VITAMINS) CAPS Take 1 capsule by mouth daily. 08/11/12   Thurnell Lose, MD    Family History Family History  Problem Relation Age of Onset  . Cancer Mother     cervical- mets  . Cancer Father   . Heart disease Father   . Diabetes Father   . Diabetes Sister   . Heart disease Sister   . Heart disease Sister   . COPD Sister     Social History Social History  Substance Use Topics  . Smoking status: Never Smoker  . Smokeless tobacco: Never Used  . Alcohol use No     Allergies   Amoxicillin-pot clavulanate; Sulfa antibiotics; and Azithromycin   Review  of Systems Review of Systems  Musculoskeletal: Positive for arthralgias (left knee and left hip) and gait problem (due to pain). Negative for joint swelling.  Skin: Negative for color change and wound.  Neurological: Negative for weakness and numbness.     Physical Exam Updated Vital Signs BP 157/81 (BP Location: Left Arm)   Pulse 77   Temp 97.6 F (36.4 C) (Oral)   Resp 18  Ht 5\' 6"  (1.676 m)   Wt 280 lb (127 kg)   SpO2 99%   BMI 45.19 kg/m   Physical Exam  Constitutional: She appears well-developed and well-nourished. No distress.  HENT:  Head: Normocephalic and atraumatic.  Eyes: Conjunctivae are normal.  Neck: Neck supple.  Cardiovascular: Normal rate, regular rhythm and intact distal pulses.   Pulmonary/Chest: Effort normal.  Musculoskeletal:       Left hip: She exhibits tenderness. She exhibits normal range of motion.       Left knee: She exhibits normal range of motion.       Left upper leg: She exhibits tenderness.  Tenderness to the anterior left leg distal to the knee. Patella appears to be in place. ROM intact, though painful mostly with full extension of the knee. No swelling, deformity, crepitus, or effusion noted. Left hip with tenderness to the posterior left gluteal region. Flexion and extension at the hip are intact. Patient is weight bearing on the left lower extremity, although painful.   Neurological: She is alert.  Skin: Skin is warm and dry. Capillary refill takes less than 2 seconds. She is not diaphoretic.  Psychiatric: She has a normal mood and affect. Her behavior is normal.  Nursing note and vitals reviewed.    ED Treatments / Results  DIAGNOSTIC STUDIES: Oxygen Saturation is 99% on RA, nl by my interpretation.    COORDINATION OF CARE: 5:22 PM Discussed treatment plan with pt at bedside which includes left knee xray, hip unilateral pelvis left xray, referral and follow up with orthopedic surgeon, diclofenac Rx, and pt agreed to  plan.   Radiology Dg Knee Complete 4 Views Left  Result Date: 07/29/2016 CLINICAL DATA:  Pain for 1 week EXAM: LEFT KNEE - COMPLETE 4+ VIEW COMPARISON:  None. FINDINGS: Frontal, lateral, and bilateral oblique views were obtained. There is no fracture or dislocation. No appreciable knee joint effusion. There is mild joint space narrowing medially. There is mild chondrocalcinosis. No erosive change. IMPRESSION: Mild joint space narrowing medially. Chondrocalcinosis is present, a finding that may be seen with osteoarthritis or with calcium pyrophosphate deposition disease. No fracture or joint effusion. Electronically Signed   By: Lowella Grip III M.D.   On: 07/29/2016 18:25   Dg Hip Unilat W Or Wo Pelvis 2-3 Views Left  Result Date: 07/29/2016 CLINICAL DATA:  Left hip pain for 2 days. Heard pop when trying to stand 2 days ago. EXAM: DG HIP (WITH OR WITHOUT PELVIS) 2-3V LEFT COMPARISON:  None. FINDINGS: There is no evidence of hip fracture or dislocation. There is no evidence of arthropathy or other focal bone abnormality. Both hip joints are maintained without significant joint space narrowing. The sacroiliac joints are symmetric in appearance without abnormal fusion or erosion. The pubic symphysis appears intact. Small phleboliths are seen in the left hemipelvis. No intra-articular loose bodies are seen. Soft tissues grossly unremarkable. IMPRESSION: Negative. Electronically Signed   By: Ashley Royalty M.D.   On: 07/29/2016 18:26    Procedures Procedures (including critical care time)  Medications Ordered in ED Medications - No data to display   Initial Impression / Assessment and Plan / ED Course  I have reviewed the triage vital signs and the nursing notes.  Pertinent imaging results that were available during my care of the patient were reviewed by me and considered in my medical decision making (see chart for details).  Clinical Course     Patient presents with left knee and left hip  pain  beginning 2-3 days ago. No acute abnormalities on x-rays. Patient ambulated. Orthopedic follow-up. The patient was given instructions for home care as well as return precautions. Patient voices understanding of these instructions, accepts the plan, and is comfortable with discharge. Pain management limited due to patient allergies and medical hx.   Vitals:   07/29/16 1614 07/29/16 1635  BP: 157/81   Pulse: 77   Resp: 18   Temp: 97.6 F (36.4 C)   TempSrc: Oral   SpO2: 99%   Weight:  127 kg  Height:  5\' 6"  (1.676 m)     Final Clinical Impressions(s) / ED Diagnoses   Final diagnoses:  Left knee pain, unspecified chronicity  Pain of left hip joint    New Prescriptions New Prescriptions   DICLOFENAC SODIUM (VOLTAREN) 1 % GEL    Apply 4 g topically 4 (four) times daily as needed (Pain).    I personally performed the services described in this documentation, which was scribed in my presence. The recorded information has been reviewed and is accurate.    Lorayne Bender, PA-C 07/29/16 [place], PA-C 07/29/16 2055    Blanchie Dessert, MD 07/30/16 1500

## 2016-07-29 NOTE — ED Notes (Signed)
Patient transported to X-ray 

## 2016-07-29 NOTE — ED Triage Notes (Signed)
Patient is complaining of left knee that started 2-3 days ago and Monday evening she got up from a sitting position, heard a "pop" to her left hip. Denies any recent injury. Took Tylenol 1500mg  about an hour ago.

## 2016-07-30 ENCOUNTER — Telehealth (INDEPENDENT_AMBULATORY_CARE_PROVIDER_SITE_OTHER): Payer: Self-pay

## 2016-07-30 ENCOUNTER — Ambulatory Visit (INDEPENDENT_AMBULATORY_CARE_PROVIDER_SITE_OTHER): Payer: Self-pay | Admitting: Sports Medicine

## 2016-07-30 ENCOUNTER — Encounter (INDEPENDENT_AMBULATORY_CARE_PROVIDER_SITE_OTHER): Payer: Self-pay | Admitting: Sports Medicine

## 2016-07-30 VITALS — BP 143/89 | HR 83 | Ht 66.0 in | Wt 280.0 lb

## 2016-07-30 DIAGNOSIS — M25562 Pain in left knee: Secondary | ICD-10-CM

## 2016-07-30 MED ORDER — TRAMADOL HCL 50 MG PO TABS: 50.0000 mg | ORAL_TABLET | Freq: Four times a day (QID) | ORAL | 0 refills | Status: DC | PRN

## 2016-07-30 MED ORDER — HYDROCODONE-ACETAMINOPHEN 5-325 MG PO TABS: 1.0000 | ORAL_TABLET | Freq: Four times a day (QID) | ORAL | 0 refills | Status: DC | PRN

## 2016-07-30 MED FILL — traMADol HCL 50 MG TABS: 50 | 2 days supply | Qty: 20 | Fill #0

## 2016-07-30 MED FILL — DICLOFENAC SODIUM 1% GEL: 1 | 17 days supply | Qty: 100 | Fill #0

## 2016-07-30 MED FILL — ?LIDOCAINE 5% PATCH: 5 | 30 days supply | Qty: 30 | Fill #0

## 2016-07-30 NOTE — Telephone Encounter (Signed)
Called in Rx for Tramadol 50mg  per Dr. Paulla Fore

## 2016-07-30 NOTE — Progress Notes (Signed)
Anita James - 65 y.o. female MRN [id number]  Date of birth: 1951/01/31  Office Visit Note: Visit Date: 07/30/2016 PCP: Maren Reamer, MD Referred by: Maren Reamer, MD  Subjective: Chief Complaint  Patient presents with  . Left Hip - Pain  . Left Knee - Pain   HPI: Patient states left hip and left knee pain for 7 days.  Pain started in the back of her left knee.  Went to [facility]), x-rays were taken. Patient wearing a sleeve on left knee, can't extend left leg out patient states.  Hurts to walk, sit, or stand with left hip.  Was told it could be her back.    ROS Otherwise per HPI.  Assessment & Plan: Visit Diagnoses:  1. Acute pain of left knee     Plan: Findings:  Patient with focal medial joint line pain suggestive of likely degenerative meniscal tear. She did have a small amount of catching initially on exam with pain with straight leg raise but this was not reproducible. Will try diagnostic & therapeutic intra-articular knee injection to see if this provides significant benefit & if any lack of improvement will need further evaluation of the lumbar spine for potential radicular component. Short course of tramadol provided she does not tolerate any type of narcotic medicines due to GI upset.    Meds & Orders:  Meds ordered this encounter  Medications  . DISCONTD: HYDROcodone-acetaminophen (NORCO) 5-325 MG tablet    Sig: Take 1 tablet by mouth every 6 (six) hours as needed.    Dispense:  15 tablet    Refill:  0  . traMADol (ULTRAM) 50 MG tablet    Sig: Take 1 tablet (50 mg total) by mouth every 6 (six) hours as needed for moderate pain.    Dispense:  30 tablet    Refill:  0    Orders Placed This Encounter  Procedures  . Large Joint Injection/Arthrocentesis    Follow-up: Return if symptoms worsen or fail to improve.   Procedures: Large Joint Inj Date/Time: 07/30/2016 12:03 PM Performed by: Teresa Coombs D Authorized by: Teresa Coombs D   Consent  Given by:  Patient Site marked: the procedure site was marked   Timeout: prior to procedure the correct patient, procedure, and site was verified   Indications:  Pain and diagnostic evaluation Location:  Knee Site:  L knee Prep: patient was prepped and draped in usual sterile fashion   Needle Size:  22 G Needle Length:  1.5 inches Approach:  Anteromedial Ultrasound Guidance: No   Fluoroscopic Guidance: No   Arthrogram: No Medications:  3 mL lidocaine 1 %; 40 mg methylPREDNISolone acetate 40 MG/ML Aspiration Attempted: No   Patient tolerance:  Patient tolerated the procedure well with no immediate complications     No notes on file   Clinical History: No specialty comments available.  She reports that she has never smoked. She has never used smokeless tobacco.   Recent Labs  09/10/15 1538 01/13/16 1425 02/28/16 1429  HGBA1C 7.20 7.4 7.5    Objective:  VS:  HT:5\' 6"  (167.6 cm)   WT:280 lb (127 kg)  BMI:45.3    BP:(!) 143/89  HR:83bpm  TEMP: ( )  RESP:  Physical Exam  Constitutional: She appears well-developed and well-nourished. No distress.  Alert and appropriately interactive.  HENT:  Head: Normocephalic and atraumatic.  Pulmonary/Chest: Effort normal. No respiratory distress.  Musculoskeletal:  Left knee: Well aligned. She does have focal joint line pain  along the medial aspect. Positive McMurray's with reproducible click. She has pain with varus & valgus testing more focally with valgus. Unable perform Thessaly. Lower extremity strength is intact. She has good hip internal & external range of motion that is nonpainful. Negative straight leg raise.  Skin: Skin is warm and dry. No rash noted. She is not diaphoretic. No erythema. No pallor.  Psychiatric: She has a normal mood and affect. Her behavior is normal. Judgment and thought content normal.    Ortho Exam Imaging: Dg Knee Complete 4 Views Left  Result Date: 07/29/2016 CLINICAL DATA:  Pain for 1 week EXAM:  LEFT KNEE - COMPLETE 4+ VIEW COMPARISON:  None. FINDINGS: Frontal, lateral, and bilateral oblique views were obtained. There is no fracture or dislocation. No appreciable knee joint effusion. There is mild joint space narrowing medially. There is mild chondrocalcinosis. No erosive change. IMPRESSION: Mild joint space narrowing medially. Chondrocalcinosis is present, a finding that may be seen with osteoarthritis or with calcium pyrophosphate deposition disease. No fracture or joint effusion. Electronically Signed   By: Lowella Grip III M.D.   On: 07/29/2016 18:25   Dg Hip Unilat W Or Wo Pelvis 2-3 Views Left  Result Date: 07/29/2016 CLINICAL DATA:  Left hip pain for 2 days. Heard pop when trying to stand 2 days ago. EXAM: DG HIP (WITH OR WITHOUT PELVIS) 2-3V LEFT COMPARISON:  None. FINDINGS: There is no evidence of hip fracture or dislocation. There is no evidence of arthropathy or other focal bone abnormality. Both hip joints are maintained without significant joint space narrowing. The sacroiliac joints are symmetric in appearance without abnormal fusion or erosion. The pubic symphysis appears intact. Small phleboliths are seen in the left hemipelvis. No intra-articular loose bodies are seen. Soft tissues grossly unremarkable. IMPRESSION: Negative. Electronically Signed   By: Ashley Royalty M.D.   On: 07/29/2016 18:26     Past Medical/Family/Surgical/Social History: Medications & Allergies reviewed per EMR Patient Active Problem List   Diagnosis Date Noted  . Vaginal yeast infection 01/15/2015  . Carpal tunnel syndrome 06/14/2014  . 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 Disturbance of skin sensation 06/08/2014  . Cervical radiculopathy at C8 06/05/2014  . Hyperlipemia 09/12/2013  . Diastolic CHF ([place]) 123XX123  . Major depression 09/12/2013  . Allergic rhinitis 06/22/2013  . Chronic pain syndrome 05/31/2013  . GERD (gastroesophageal reflux disease) 08/30/2012  . Hypertension   .  Hypothyroidism   . Type 2 diabetes, uncontrolled, with neuropathy ([place])    Past Medical History:  Diagnosis Date  . Anxiety   . Carpal tunnel syndrome 06/14/2014  . Chest pain   . CHF (congestive heart failure) ([place])   . Chronic back pain   . Chronic kidney disease 03/2013   failure due to meds  . Diabetes mellitus type II, uncontrolled ([place])   . Diverticulitis   . Frequent headaches   . Hiatal hernia   . Hyperlipemia   . Hypertension   . Hypothyroidism   . 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 Neuropathy ([place])   . Obesity   . Pneumonia   . Vitamin D deficiency 06/05/2014   Family History  Problem Relation Age of Onset  . Cancer Mother     cervical- mets  . Cancer Father   . Heart disease Father   . Diabetes Father   . Diabetes Sister   . Heart disease Sister   . Heart disease Sister   . COPD Sister    Past Surgical  History:  Procedure Laterality Date  . CESAREAN SECTION    . KNEE SURGERY Left   . THYROID SURGERY     goiter   Social History   Occupational History  . unemployed    Social History Main Topics  . Smoking status: Never Smoker  . Smokeless tobacco: Never Used  . Alcohol use No  . Drug use: No  . Sexual activity: Not on file

## 2016-08-03 ENCOUNTER — Ambulatory Visit (INDEPENDENT_AMBULATORY_CARE_PROVIDER_SITE_OTHER): Payer: Self-pay | Admitting: Sports Medicine

## 2016-08-03 ENCOUNTER — Other Ambulatory Visit: Payer: Self-pay | Admitting: Internal Medicine

## 2016-08-03 ENCOUNTER — Encounter (INDEPENDENT_AMBULATORY_CARE_PROVIDER_SITE_OTHER): Payer: Self-pay | Admitting: Sports Medicine

## 2016-08-03 ENCOUNTER — Ambulatory Visit (INDEPENDENT_AMBULATORY_CARE_PROVIDER_SITE_OTHER): Payer: Self-pay

## 2016-08-03 VITALS — BP 128/78 | HR 88 | Ht 66.0 in | Wt 280.0 lb

## 2016-08-03 DIAGNOSIS — M25562 Pain in left knee: Secondary | ICD-10-CM

## 2016-08-03 DIAGNOSIS — M25551 Pain in right hip: Secondary | ICD-10-CM

## 2016-08-03 DIAGNOSIS — M25552 Pain in left hip: Secondary | ICD-10-CM

## 2016-08-03 DIAGNOSIS — M5416 Radiculopathy, lumbar region: Secondary | ICD-10-CM

## 2016-08-03 DIAGNOSIS — E1140 Type 2 diabetes mellitus with diabetic neuropathy, unspecified: Secondary | ICD-10-CM

## 2016-08-03 MED ORDER — METHYLPREDNISOLONE ACETATE 40 MG/ML IJ SUSP
40.0000 mg | INTRAMUSCULAR | Status: AC | PRN
  Administered 2016-07-30: 40 mg via INTRA_ARTICULAR

## 2016-08-03 MED ORDER — LIDOCAINE HCL 1 % IJ SOLN
3.0000 mL | INTRAMUSCULAR | Status: AC | PRN
  Administered 2016-07-30: 3 mL

## 2016-08-03 MED FILL — METOPROLOL SUCC ER 200 MG T: 200 | 30 days supply | Qty: 30 | Fill #1

## 2016-08-03 MED FILL — ?GABAPENTIN 300 MG CAPSULE: 300 | 30 days supply | Qty: 120 | Fill #0

## 2016-08-03 MED FILL — ATORVASTATIN 40 MG TABLET: 40 | 30 days supply | Qty: 30 | Fill #1

## 2016-08-03 NOTE — Progress Notes (Signed)
Anita James - 65 y.o. female MRN [id number]  Date of birth: Nov 24, 1950  Office Visit Note: Visit Date: 08/03/2016 PCP: Maren Reamer, MD Referred by: Maren Reamer, MD  Subjective: Chief Complaint  Patient presents with  . Left Knee - Follow-up  . Follow-up    Patient states injection helped a little. States she still can't climb steps.  A little swollen and hurting on the back of left knee. Wearing knee sleeve.  Left hip is still a little irritated.      HPI: Patient is status post injection on 07/30/2016. Reports now worsening of the pain to the point that she has a hard time bending or straightening her knee. Pain is mainly located in the posterior aspect of her knee. She has pain that radiates into the hip & occasionally down towards the Foot. No children radicular pattern. She has had issues with weakness in her leg giving way but no mechanical clicking & popping or locking of the knee. ROS: Denies fevers, chills, recent weight gain or weight loss.  No night sweats. No significant nighttime awakenings due to this issue.  . Otherwise per HPI.  Assessment & Plan: Visit Diagnoses:  1. Acute pain of left knee   2. Left hip pain   3. Lumbar radiculopathy     Plan: Given the positive neural tension signs today in spite of mainly localizes knee pain due to think this is coming from likely a L4 radiculopathy. MRI of the lumbar spine is warranted at this time. If any lack of findings on MRI of the spine MRI of the knee will be recommended in we will plan to set this up prior to see her back. If the MRI of her lumbar spine does show pathology that is amenable to intervention will make appropriate referrals most likely to Dr. Ernestina Patches. Meds: No orders of the defined types were placed in this encounter.  Follow-up: Return for We will call you after your back MRI to discuss either the referal to Dr. Ernestina Patches or about other test.   Clinical History: No specialty comments available.  She  reports that she has never smoked. She has never used smokeless tobacco.   Recent Labs  09/10/15 1538 01/13/16 1425 02/28/16 1429  HGBA1C 7.20 7.4 7.5    Objective:  VS:  HT:5\' 6"  (167.6 cm)   WT:280 lb (127 kg)  BMI:45.3    BP:128/78  HR:88bpm  TEMP: ( )  RESP:  Physical Exam: Adult female. No acute distress. Alert & appropriate. Bilateral lower extremities are overall well line. She has improved medial joint line pain. Pain is noticeable within the popliteal fossa. She has no effusion. Good internal/external rotation of bilateral hips. Pain is most focally exacerbated with straight leg raise. She is able to heel & toe walk & otherwise sensation in the bilateral lower extremities is intact to light touch. Imaging: Dg Knee Complete 4 Views Left  Result Date: 07/29/2016 CLINICAL DATA:  Pain for 1 week EXAM: LEFT KNEE - COMPLETE 4+ VIEW COMPARISON:  None. FINDINGS: Frontal, lateral, and bilateral oblique views were obtained. There is no fracture or dislocation. No appreciable knee joint effusion. There is mild joint space narrowing medially. There is mild chondrocalcinosis. No erosive change. IMPRESSION: Mild joint space narrowing medially. Chondrocalcinosis is present, a finding that may be seen with osteoarthritis or with calcium pyrophosphate deposition disease. No fracture or joint effusion. Electronically Signed   By: Lowella Grip III M.D.   On: 07/29/2016 18:25  Dg Hip Unilat W Or Wo Pelvis 2-3 Views Left  Result Date: 07/29/2016 CLINICAL DATA:  Left hip pain for 2 days. Heard pop when trying to stand 2 days ago. EXAM: DG HIP (WITH OR WITHOUT PELVIS) 2-3V LEFT COMPARISON:  None. FINDINGS: There is no evidence of hip fracture or dislocation. There is no evidence of arthropathy or other focal bone abnormality. Both hip joints are maintained without significant joint space narrowing. The sacroiliac joints are symmetric in appearance without abnormal fusion or erosion. The pubic  symphysis appears intact. Small phleboliths are seen in the left hemipelvis. No intra-articular loose bodies are seen. Soft tissues grossly unremarkable. IMPRESSION: Negative. Electronically Signed   By: Ashley Royalty M.D.   On: 07/29/2016 18:26   Xr Lumbar Spine 2-3 Views  Result Date: 08/07/2016 Findings: 2V lumbar spine: Overall neutral lateral spine with exaggerated lumbar lordosis.  Multilevel degenerative changes with marked joint space narrowing & osteophytic spurring between L2-3 & L3-4.  No acute fracture/dislocation.  No significant soft tissue changes. Impression: Multilevel degenerative changes most notably at L2-3 & L3-4.

## 2016-08-10 MED FILL — LISINOPRIL 40 MG TABLET: 40 | 30 days supply | Qty: 30 | Fill #0

## 2016-08-17 ENCOUNTER — Ambulatory Visit: Payer: Self-pay | Attending: Internal Medicine | Admitting: Internal Medicine

## 2016-08-17 ENCOUNTER — Encounter: Payer: Self-pay | Admitting: Internal Medicine

## 2016-08-17 ENCOUNTER — Ambulatory Visit (INDEPENDENT_AMBULATORY_CARE_PROVIDER_SITE_OTHER): Payer: Self-pay | Admitting: Sports Medicine

## 2016-08-17 VITALS — BP 155/94 | HR 80 | Temp 98.1°F | Resp 16 | Wt 285.2 lb

## 2016-08-17 DIAGNOSIS — I10 Essential (primary) hypertension: Secondary | ICD-10-CM

## 2016-08-17 DIAGNOSIS — E785 Hyperlipidemia, unspecified: Secondary | ICD-10-CM

## 2016-08-17 DIAGNOSIS — G4733 Obstructive sleep apnea (adult) (pediatric): Secondary | ICD-10-CM

## 2016-08-17 DIAGNOSIS — E1122 Type 2 diabetes mellitus with diabetic chronic kidney disease: Secondary | ICD-10-CM | POA: Insufficient documentation

## 2016-08-17 DIAGNOSIS — Z1239 Encounter for other screening for malignant neoplasm of breast: Secondary | ICD-10-CM

## 2016-08-17 DIAGNOSIS — N189 Chronic kidney disease, unspecified: Secondary | ICD-10-CM | POA: Insufficient documentation

## 2016-08-17 DIAGNOSIS — Z794 Long term (current) use of insulin: Secondary | ICD-10-CM | POA: Insufficient documentation

## 2016-08-17 DIAGNOSIS — I130 Hypertensive heart and chronic kidney disease with heart failure and stage 1 through stage 4 chronic kidney disease, or unspecified chronic kidney disease: Secondary | ICD-10-CM | POA: Insufficient documentation

## 2016-08-17 DIAGNOSIS — E1140 Type 2 diabetes mellitus with diabetic neuropathy, unspecified: Secondary | ICD-10-CM

## 2016-08-17 DIAGNOSIS — Z1211 Encounter for screening for malignant neoplasm of colon: Secondary | ICD-10-CM

## 2016-08-17 DIAGNOSIS — Z79899 Other long term (current) drug therapy: Secondary | ICD-10-CM | POA: Insufficient documentation

## 2016-08-17 DIAGNOSIS — I509 Heart failure, unspecified: Secondary | ICD-10-CM | POA: Insufficient documentation

## 2016-08-17 DIAGNOSIS — Z1231 Encounter for screening mammogram for malignant neoplasm of breast: Secondary | ICD-10-CM

## 2016-08-17 DIAGNOSIS — E1165 Type 2 diabetes mellitus with hyperglycemia: Secondary | ICD-10-CM

## 2016-08-17 DIAGNOSIS — K219 Gastro-esophageal reflux disease without esophagitis: Secondary | ICD-10-CM

## 2016-08-17 DIAGNOSIS — IMO0002 Reserved for concepts with insufficient information to code with codable children: Secondary | ICD-10-CM

## 2016-08-17 DIAGNOSIS — E038 Other specified hypothyroidism: Secondary | ICD-10-CM

## 2016-08-17 LAB — BASIC METABOLIC PANEL
BUN: 14 mg/dL (ref 7–25)
CALCIUM: 8.9 mg/dL (ref 8.6–10.4)
CO2: 28 mmol/L (ref 20–31)
CREATININE: 0.69 mg/dL (ref 0.50–0.99)
Chloride: 103 mmol/L (ref 98–110)
Glucose, Bld: 245 mg/dL — ABNORMAL HIGH (ref 65–99)
Potassium: 3.7 mmol/L (ref 3.5–5.3)
SODIUM: 139 mmol/L (ref 135–146)

## 2016-08-17 LAB — GLUCOSE, POCT (MANUAL RESULT ENTRY)
POC GLUCOSE: 275 mg/dL — AB (ref 70–99)
POC Glucose: 239 mg/dl — AB (ref 70–99)

## 2016-08-17 LAB — POCT GLYCOSYLATED HEMOGLOBIN (HGB A1C): HEMOGLOBIN A1C: 9.4

## 2016-08-17 MED ORDER — LISINOPRIL 40 MG PO TABS: 40.0000 mg | ORAL_TABLET | Freq: Every day | ORAL | 3 refills | Status: DC

## 2016-08-17 MED ORDER — ATORVASTATIN CALCIUM 40 MG PO TABS: 40.0000 mg | ORAL_TABLET | Freq: Every day | ORAL | 3 refills | Status: DC

## 2016-08-17 MED ORDER — LIRAGLUTIDE 18 MG/3ML ~~LOC~~ SOPN: PEN_INJECTOR | SUBCUTANEOUS | 3 refills | Status: DC

## 2016-08-17 MED ORDER — GABAPENTIN 300 MG PO CAPS: 600.0000 mg | ORAL_CAPSULE | Freq: Two times a day (BID) | ORAL | 2 refills | Status: DC

## 2016-08-17 MED ORDER — METOPROLOL SUCCINATE ER 200 MG PO TB24: 200.0000 mg | ORAL_TABLET | Freq: Every day | ORAL | 3 refills | Status: DC

## 2016-08-17 MED ORDER — DEXLANSOPRAZOLE 30 MG PO CPDR: 30.0000 mg | DELAYED_RELEASE_CAPSULE | Freq: Every day | ORAL | 1 refills | Status: DC

## 2016-08-17 MED ORDER — AMLODIPINE BESYLATE 10 MG PO TABS: 10.0000 mg | ORAL_TABLET | Freq: Every day | ORAL | 3 refills | Status: DC

## 2016-08-17 MED ORDER — LEVOTHYROXINE SODIUM 100 MCG PO TABS: 100.0000 ug | ORAL_TABLET | Freq: Every day | ORAL | 1 refills | Status: DC

## 2016-08-17 MED ORDER — INSULIN GLARGINE 100 UNIT/ML SOLOSTAR PEN: 70.0000 [IU] | PEN_INJECTOR | Freq: Every day | SUBCUTANEOUS | 3 refills | Status: DC

## 2016-08-17 MED ORDER — INSULIN ASPART 100 UNIT/ML FLEXPEN: 35.0000 [IU] | PEN_INJECTOR | Freq: Three times a day (TID) | SUBCUTANEOUS | 3 refills | Status: DC

## 2016-08-17 MED FILL — AMLODIPINE BESYLATE 10 MG T: 10 | 30 days supply | Qty: 30 | Fill #0

## 2016-08-17 NOTE — Progress Notes (Signed)
Anita James, is a 65 y.o. female  [id number]  [id number]  DOB - 1951/02/20  Chief Complaint  Patient presents with  . Diabetes        Subjective:   Anita James is a 65 y.o. female here today for a follow up visit. Last seen 9/17 for URI, now back for f/u for dm and htn.  She has been on lantus 60 qhs and novolog 30 tid. However, she does not note any improvements in sugars and has noted considerable weight gain.  She requested a "pill" for weight loss.    Of note, she does not watch her "carbohydrates" /does not know what that is, but watches the "sugar".    For breakfast today, she ate peanut butter sandwich and saltine crackers. Forgot to take her am Novolog today prior to appt as well.  Of note, she checks her cbg occasionally, but limiting checks b/c running out of supplies.  She dose not recall which glucometer she has.   [name], [name], [name], [name], [name]  No problems updated.  ALLERGIES: Allergies  Allergen Reactions  . Amoxicillin-Pot Clavulanate Nausea And Vomiting and Other (See Comments)    Combination of medications tear lining of stomach  . Sulfa Antibiotics Shortness Of Breath  . Azithromycin     PAST MEDICAL HISTORY: Past Medical History:  Diagnosis Date  . Anxiety   . Carpal tunnel syndrome 06/14/2014  . Chest pain   . CHF (congestive heart failure) ([place])   . Chronic back pain   . Chronic kidney disease 03/2013   failure due to meds  . Diabetes mellitus type II, uncontrolled ([place])   . Diverticulitis   . Frequent headaches   . Hiatal hernia   . Hyperlipemia   . Hypertension   . Hypothyroidism   . 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 Neuropathy ([place])   . Obesity   . Pneumonia   . Vitamin D deficiency 06/05/2014    MEDICATIONS AT HOME: Prior to Admission medications   Medication Sig Start Date End Date Taking? Authorizing Provider  amLODipine  (NORVASC) 10 MG tablet Take 1 tablet (10 mg total) by mouth daily. 08/17/16   Maren Reamer, MD  atorvastatin (LIPITOR) 40 MG tablet Take 1 tablet (40 mg total) by mouth daily. 08/17/16   Maren Reamer, MD  Cetirizine HCl 10 MG CAPS Take 1 capsule (10 mg total) by mouth daily. 05/28/16   Maren Reamer, MD  cyclobenzaprine (FLEXERIL) 5 MG tablet Take 1 tablet (5 mg total) by mouth 3 (three) times daily as needed for muscle spasms. 01/13/16   Arnoldo Morale, MD  Dexlansoprazole (DEXILANT) 30 MG capsule Take 1 capsule (30 mg total) by mouth daily. 08/17/16   Maren Reamer, MD  diclofenac sodium (VOLTAREN) 1 % GEL Apply 4 g topically 4 (four) times daily as needed (Pain). 07/29/16   Shawn C Joy, PA-C  gabapentin (NEURONTIN) 300 MG capsule Take 2 capsules (600 mg total) by mouth 2 (two) times daily. 08/17/16   Maren Reamer, MD  guaiFENesin (MUCINEX) 600 MG 12 hr tablet Take 1 tablet (600 mg total) by mouth 2 (two) times daily as needed. 05/28/16   Maren Reamer, MD  insulin aspart (NOVOLOG) 100 UNIT/ML FlexPen Inject 35 Units into the skin 3 (three) times daily with meals. 08/17/16   Maren Reamer, MD  Insulin Glargine (LANTUS SOLOSTAR)  100 UNIT/ML Solostar Pen Inject 70 Units into the skin daily at 10 pm. 08/17/16   Maren Reamer, MD  levofloxacin (LEVAQUIN) 500 MG tablet Take 1 tablet (500 mg total) by mouth daily. 05/28/16   Maren Reamer, MD  levothyroxine (SYNTHROID, LEVOTHROID) 100 MCG tablet Take 1 tablet (100 mcg total) by mouth daily before breakfast. 08/17/16   Maren Reamer, MD  liraglutide 18 MG/3ML SOPN 0.6 mg subcutaneously daily for1 week, then 1.2 mg daily for 1 week, then 1.8 mg daily subsequently. 08/17/16   Maren Reamer, MD  lisinopril (PRINIVIL,ZESTRIL) 40 MG tablet Take 1 tablet (40 mg total) by mouth daily. 08/17/16   Maren Reamer, MD  loratadine (CLARITIN) 10 MG tablet Take 1 tablet (10 mg total) by mouth daily. 04/06/16   Maren Reamer, MD  metoprolol  (TOPROL-XL) 200 MG 24 hr tablet Take 1 tablet (200 mg total) by mouth daily. 08/17/16   Maren Reamer, MD  mometasone (NASONEX) 50 MCG/ACT nasal spray Place 2 sprays into the nose daily. 05/04/16   Maren Reamer, MD  Multiple Vitamins-Minerals (EYE VITAMINS) CAPS Take 1 capsule by mouth daily. 08/11/12   Thurnell Lose, MD  traMADol (ULTRAM) 50 MG tablet Take 1 tablet (50 mg total) by mouth every 6 (six) hours as needed for moderate pain. 07/30/16   Gerda Diss, DO     Objective:   Vitals:   08/17/16 1201  BP: (!) 155/94  Pulse: 80  Resp: 16  Temp: 98.1 F (36.7 C)  TempSrc: Oral  SpO2: 95%  Weight: 285 lb 3.2 oz (129.4 kg)    Exam General appearance : Awake, alert, not in any distress. Speech Clear. Not toxic looking, morbid obese. Pleasant. HEENT: Atraumatic and Normocephalic, pupils equally reactive to light. Neck: supple, no JVD.  Chest:Good air entry bilaterally, no added sounds. CVS: S1 S2 regular, no murmurs/gallups or rubs. Abdomen: Bowel sounds active, Non tender and not distended with no gaurding, rigidity or rebound. Extremities: B/L Lower Ext shows no edema, both legs are warm to touch Neurology: Awake alert, and oriented X 3, CN II-XII grossly intact, Non focal Skin:No Rash  Data Review Lab Results  Component Value Date   HGBA1C 7.5 02/28/2016   HGBA1C 7.4 01/13/2016   HGBA1C 7.20 09/10/2015    Depression screen PHQ 2/9 04/06/2016 02/28/2016 01/13/2016 09/10/2015 06/04/2015  Decreased Interest 1 3 3  0 0  Down, Depressed, Hopeless 1 3 3  0 0  PHQ - 2 Score 2 6 6  0 0  Altered sleeping 3 3 3  - -  Tired, decreased energy 1 3 3  - -  Change in appetite 1 3 2  - -  Feeling bad or failure about yourself  3 3 3  - -  Trouble concentrating 1 3 1  - -  Moving slowly or fidgety/restless 0 3 1 - -  Suicidal thoughts 0 3 0 - -  PHQ-9 Score 11 27 19  - -  Difficult doing work/chores - Extremely dIfficult - - -      Assessment & Plan   1. Type 2 diabetes,  uncontrolled, with neuropathy ([place]) Ooc, suspect large part due to diet.  Showed her that peanut butter has over 11gm carbs. - increased lantus to 70 units qhs. - insulin aspart (NOVOLOG) 100 UNIT/ML FlexPen; Inject 35 Units into the skin 3 (three) times daily with meals.  Dispense: 15 mL; Refill: 3 - Ambulatory referral to Ophthalmology - liraglutide 18 MG/3ML SOPN; 0.6 mg subcutaneously daily  for1 week, then 1.2 mg daily for 1 week, then 1.8 mg daily subsequently.  Dispense: 18 mL; Refill: 3 - Basic Metabolic Panel - dw pt self -titration q3 days on lantus, to increase dose by 1unit q3 days for goal am cbg 80-140s.  Of note, if she does plan to do a low carb diet, she may noticed that her insulin requirements will decrease dramatically. - [facility] 2 wks for dm chk.  2. Essential hypertension - review of bp 07/30/16 controlled, suspect worse w/ holidays/dietary indiscretions. - again discussed low salt diet. - lisinopril (PRINIVIL,ZESTRIL) 40 MG tablet; Take 1 tablet (40 mg total) by mouth daily.  Dispense: 90 tablet; Refill: 3 - metoprolol (TOPROL-XL) 200 MG 24 hr tablet; Take 1 tablet (200 mg total) by mouth daily.  Dispense: 90 tablet; Refill: 3 - pt to find out what glucometer she has, and we will call in rx for supplies as well.  3. Hyperlipidemia, unspecified hyperlipidemia type - atorvastatin (LIPITOR) 40 MG tablet; Take 1 tablet (40 mg total) by mouth daily.  Dispense: 90 tablet; Refill: 3  4. Other specified hypothyroidism - levothyroxine (SYNTHROID, LEVOTHROID) 100 MCG tablet; Take 1 tablet (100 mcg total) by mouth daily before breakfast.  Dispense: 90 tablet; Refill: 1 - TSH - T4, Free  5. Gastroesophageal reflux disease without esophagitis - gerd diet again discussed. - Dexlansoprazole (DEXILANT) 30 MG capsule; Take 1 capsule (30 mg total) by mouth daily.  Dispense: 30 capsule; Refill: 1  6. Type 2 diabetes, controlled, with neuropathy ([place]) See #1 -  gabapentin (NEURONTIN) 300 MG capsule; Take 2 capsules (600 mg total) by mouth 2 (two) times daily.  Dispense: 120 capsule; Refill: 2  7. OSA (obstructive sleep apnea) Had sleep study, but needs titration for cpap. Per pt, has not heard back, will re- order. - Cpap titration; Future  8. Colon cancer screening - Ambulatory referral to Gastroenterology  9. Breast cancer screening - MM Digital Screening; Future  10. Morbid obesity - I discussed with her import of low carb diet for weight loss, info    Patient have been counseled extensively about nutrition and exercise  Return in about 4 weeks (around 09/14/2016) for dm.  /pap smear.  The patient was given clear instructions to go to ER or return to medical center if symptoms don't improve, worsen or new problems develop. The patient verbalized understanding. The patient was told to call to get lab results if they haven't heard anything in the next week.   This note has been created with Surveyor, quantity. Any transcriptional errors are unintentional.   Maren Reamer, MD, [facility] [place], [place]   08/17/2016, 2:43 PM

## 2016-08-17 NOTE — Progress Notes (Signed)
Pt is in the office today for diabetes Pt states she is needing her synthroid, dexilant and nasla spray sent to the health department

## 2016-08-17 NOTE — Patient Instructions (Addendum)
- [place] - pharm 2 wks dm   QUICK START PATIENT GUIDE TO LCHF/IF LOW CARB HIGH FAT / INTERMITTENT FASTING  Recommend: <50 gram carbohydrate a day for weightloss.  What is this diet and how does it work? o Insulin is a hormone made by your body that allows you to use sugar (glucose) from carbohydrates in the food you eat for energy or to store glucose (as fat) for future use  o Insulin levels need to be lowered in order to utilize our stored energy (fat) o Many struggling with obesity are insulin resistant and have high levels of insulin o This diet works to lower your insulin in two ways o Fasting - allows your insulin levels to naturally decrease  o Avoiding carbohydrates - carbs trigger increase in insulin Low Carb Healthy Fat (LCHF) o Get a free app for your phone, such as MyFitnessPal, to help you track your macronutrients (carbs/protein/fats) and to track your weight and body measurements to see your progress o Set your goal for around 10% carbs/20% protein/70% fat o A good starting goal for amount of net carbs per day is 50 grams (some will aim for 20 grams) o "Net carbs" refers to total grams of carbs minus grams of fiber (as fiber is not typically absorbed). For example, if a food has 5g total carb and 3g fiber, that would be 2g net carbs o Increase healthy fats - eg. olive oil, eggs, nuts, avocado, cheese, butter, coconut, meats, fish o Avoid high carb foods - eg. bread, pasta, potatoes, rice, cookies, soda, juice, anything sugary o Buy full-fat ingredients (avoid low-fat versions, which often have more sugar) o No need to count calories, but pay close attention to grams of carbs on labels Intermittent Fasting (IF) o "Fasting" is going a period of time without eating - it helps to stay busy and well-hydrated o Purpose of fasting is to allow insulin levels to drop as low as possible, allowing your body to switch into fat-burning mode o With this diet there are many approaches to  fasting, but 16:8 and 24hr fasts are commonly used o 16:8 fast, usually 5-7 days a week - Fasting for 16 hours of the day, then eating all meals for the day over course of 8 hours. o 24 hour fast, usually 1-3 days a week - Typically eating one meal a day, then fasting until the next day. Plenty of fluids (and some salt to help you hold onto fluids) are recommended during longer fasts.  o During fasts certain beverages are still acceptable - water, sparkling water, bone broth, black tea or coffee, or tea/coffee with small amount of heavy whipping cream Special note for those on diabetic medications o Discuss your medications with your physician. You may need to hold your medication or adjust to only taking when eating. Diabetics should keep close track of their blood sugars when making any changes to diet/meds, to ensure they are staying within normal limits For more info about LCHF/IF o Watch "Therapeutic Fasting - Solving the Two-Compartment Problem" video by Dr. Sharman Cheek on YouTube (GreatestGyms.com.ee) for a great intro to these concepts o Read "The Obesity Code" and/or "The Complete Guide to Fasting" by Dr. Sharman Cheek o Go to www.dietdoctor.com for explanations, recipes, and infographics about foods to eat/avoid o Get a Free smartphone app that helps count carbohydrates  - ie MyKeto EXAMPLES TO GET STARTED Fasting Beverages -water (can add  tsp [place] salt once or twice a day to help  stay hydrated for longer fasts) -Sparkling water (such as [employer] or similar; avoid any with artificial sweeteners)  -Bone broth (multiple recipes available online or can buy pre-made) -Tea or Coffee (Adding heavy whipping cream or coconut oil to your tea or coffee can be helpful if you find yourself getting too hungry during the fasts. Can also add cinnamon for flavor. Or "bulletproof coffee.") Low Carb Healthy Fat Breakfast (if not fasting) -eggs in butter or olive oil with  avocado -omelet with veggies and cheese  Lunch -hamburger with cheese and avocado wrapped in lettuce (no bun, no ketchup) -meat and cheese wrapped in lettuce (can dip in mustard or olive oil/vinegar/mayo) -salad with meat/cheese/nuts and higher fat dressing (vinaigrette or Ranch, etc) -tuna salad lettuce wrap -taco meat with cheese, sour cream, guacamole, cheese over lettuce  Dinner -steak with herb butter or [place] sauce -"Fathead" pizza (uses cheese and almond flour for the dough - several recipes available online) -roasted or grilled chicken with skin on, with low carb sauce (buffalo, garlic butter, alfredo, pesto, etc) -baked salmon with lemon butter -chicken alfredo with zucchini noodles -[place] butter chicken with low carb garlic naan -egg roll in a bowl  Side Dishes -mashed cauliflower (homemade or available in freezer section) -roast vegetables (green veggies that grow above ground rather than root veggies) with butter or cheese -Caprese salad (fresh mozzarella, tomato and basil with olive oil) -homemade low-carb coleslaw Snacks/Desserts (try to avoid unnecessary snacking and sweets in general) -celery or cucumber dipped in guacamole or sour cream dip -cheese and meat slices  -raspberries with whipped cream (can make homemade with no sugar added) -low carb [place] butter cake  AVOID - sugar, diet/regular soda, potatoes, breads, rice, pasta, candy, cookies, cakes, muffins, juice, high carb fruit (bananas, grapes), beer, ketchup, barbeque and other sweet sauces

## 2016-08-18 ENCOUNTER — Other Ambulatory Visit: Payer: Self-pay | Admitting: Internal Medicine

## 2016-08-18 DIAGNOSIS — E038 Other specified hypothyroidism: Secondary | ICD-10-CM

## 2016-08-18 LAB — TSH: TSH: 1.66 mIU/L

## 2016-08-18 LAB — T4, FREE: FREE T4: 1.2 ng/dL (ref 0.8–1.8)

## 2016-08-18 MED ORDER — LEVOTHYROXINE SODIUM 100 MCG PO TABS: 100.0000 ug | ORAL_TABLET | Freq: Every day | ORAL | 1 refills | Status: DC

## 2016-08-19 ENCOUNTER — Telehealth: Payer: Self-pay

## 2016-08-19 ENCOUNTER — Ambulatory Visit: Payer: Self-pay | Attending: Internal Medicine

## 2016-08-19 DIAGNOSIS — Z111 Encounter for screening for respiratory tuberculosis: Secondary | ICD-10-CM | POA: Insufficient documentation

## 2016-08-19 NOTE — Telephone Encounter (Signed)
Contacted pt to go over lab results pt is aware of results. Pt states she was taken off on Amlodipine before because it would cause damage to her kidneys. Pt states she will take medicine till she come in to see the pharmacist

## 2016-08-20 ENCOUNTER — Ambulatory Visit (HOSPITAL_BASED_OUTPATIENT_CLINIC_OR_DEPARTMENT_OTHER): Payer: Self-pay | Attending: Internal Medicine | Admitting: Internal Medicine

## 2016-08-20 VITALS — Ht 66.0 in | Wt 285.0 lb

## 2016-08-20 DIAGNOSIS — G4733 Obstructive sleep apnea (adult) (pediatric): Secondary | ICD-10-CM | POA: Insufficient documentation

## 2016-08-20 DIAGNOSIS — Z79899 Other long term (current) drug therapy: Secondary | ICD-10-CM | POA: Insufficient documentation

## 2016-08-20 DIAGNOSIS — I493 Ventricular premature depolarization: Secondary | ICD-10-CM | POA: Insufficient documentation

## 2016-08-20 DIAGNOSIS — R0683 Snoring: Secondary | ICD-10-CM | POA: Insufficient documentation

## 2016-08-20 DIAGNOSIS — Z794 Long term (current) use of insulin: Secondary | ICD-10-CM | POA: Insufficient documentation

## 2016-08-21 ENCOUNTER — Other Ambulatory Visit (HOSPITAL_BASED_OUTPATIENT_CLINIC_OR_DEPARTMENT_OTHER): Payer: Self-pay

## 2016-08-21 ENCOUNTER — Ambulatory Visit: Payer: Self-pay | Attending: Internal Medicine

## 2016-08-21 DIAGNOSIS — Z111 Encounter for screening for respiratory tuberculosis: Secondary | ICD-10-CM | POA: Insufficient documentation

## 2016-08-21 DIAGNOSIS — G4733 Obstructive sleep apnea (adult) (pediatric): Secondary | ICD-10-CM

## 2016-08-21 LAB — TB SKIN TEST
Induration: 0 mm
TB SKIN TEST: NEGATIVE

## 2016-08-21 LAB — READ PPD: TB Skin Test: NEGATIVE

## 2016-08-22 NOTE — Procedures (Signed)
   Patient Name: Anita James, Anita James Date: 08/20/2016 Gender: Female D.O.B: [birth year] Age (years): 69 Referring Provider: Maren Reamer Height (inches): 66 Interpreting Physician: Baird Lyons MD, ABSM Weight (lbs): 280 RPSGT: Gerhard Perches BMI: 45 MRN: [id number] Neck Size: 17.00 CLINICAL INFORMATION The patient is referred for a CPAP titration to treat sleep apnea.   Date of NPSG, Split Night or HST:  Diagnostic NPSG 04/01/16  AHI  13.1/ hr, desaturation to 73%, body weight 280 lbs  SLEEP STUDY TECHNIQUE As per the AASM Manual for the Scoring of Sleep and Associated Events v2.3 (April 2016) with a hypopnea requiring 4% desaturations. The channels recorded and monitored were frontal, central and occipital EEG, electrooculogram (EOG), submentalis EMG (chin), nasal and oral airflow, thoracic and abdominal wall motion, anterior tibialis EMG, snore microphone, electrocardiogram, and pulse oximetry. Continuous positive airway pressure (CPAP) was initiated at the beginning of the study and titrated to treat sleep-disordered breathing.  MEDICATIONS Medications self-administered by patient taken the night of the study : METOPROLOL, GABAPENTIN, EXTRA STRENGTH TYLENOL, INSULIN GLARGINE  TECHNICIAN COMMENTS Comments added by technician: CPAP TITRATION.  Comments added by scorer: N/A  RESPIRATORY PARAMETERS Optimal PAP Pressure (cm): 17 AHI at Optimal Pressure (/hr): 2.0 Overall Minimal O2 (%): 86.00 Supine % at Optimal Pressure (%): 0 Minimal O2 at Optimal Pressure (%): 91.0    SLEEP ARCHITECTURE The study was initiated at 11:00:36 PM and ended at 5:02:19 AM. Sleep onset time was 3.0 minutes and the sleep efficiency was 97.2%. The total sleep time was 351.5 minutes. The patient spent 2.42% of the night in stage N1 sleep, 75.82% in stage N2 sleep, 0.00% in stage N3 and 21.76% in REM.Stage REM latency was 85.0 minutes Wake after sleep onset was 7.2. Alpha intrusion was absent.  Supine sleep was 31.15%.  CARDIAC DATA The 2 lead EKG demonstrated sinus rhythm. The mean heart rate was 71.99 beats per minute. Other EKG findings include: PVCs.  LEG MOVEMENT DATA The total Periodic Limb Movements of Sleep (PLMS) were 0. The PLMS index was 0.00. A PLMS index of <15 is considered normal in adults.  IMPRESSIONS - The optimal PAP pressure was 17 cm of water. - Central sleep apnea was not noted during this titration (CAI = 0.5/h). - Moderate oxygen desaturations were observed during this titration (min O2 = 86.00%). - The patient snored with Soft snoring volume during this titration study. - 2-lead EKG demonstrated: PVCs - Clinically significant periodic limb movements were not noted during this study. Arousals associated with PLMs were rare.  DIAGNOSIS - Obstructive Sleep Apnea (327.23 [G47.33 ICD-10])  RECOMMENDATIONS - Trial of CPAP therapy on 17 cm H2O with a Small size Resmed Full Face Mask AirFit F20 mask and heated humidification. - Avoid alcohol, sedatives and other CNS depressants that may worsen sleep apnea and disrupt normal sleep architecture. - Sleep hygiene should be reviewed to assess factors that may improve sleep quality. - Weight management and regular exercise should be initiated or continued.  [Electronically signed] 08/22/2016 12:31 PM  Baird Lyons MD, [place], American Board of Sleep Medicine   NPI: [id number]  [place], [place] of [employer] [facility]  ELECTRONICALLY SIGNED ON:  08/22/2016, 12:25 PM [place] PH: [phone]   FX: [phone] [place]

## 2016-08-24 ENCOUNTER — Other Ambulatory Visit: Payer: Self-pay | Admitting: Pharmacist

## 2016-08-24 DIAGNOSIS — IMO0002 Reserved for concepts with insufficient information to code with codable children: Secondary | ICD-10-CM

## 2016-08-24 DIAGNOSIS — E1140 Type 2 diabetes mellitus with diabetic neuropathy, unspecified: Secondary | ICD-10-CM

## 2016-08-24 DIAGNOSIS — E1165 Type 2 diabetes mellitus with hyperglycemia: Principal | ICD-10-CM

## 2016-08-24 MED ORDER — INSULIN GLARGINE 100 UNIT/ML SOLOSTAR PEN: 70.0000 [IU] | PEN_INJECTOR | Freq: Every day | SUBCUTANEOUS | 3 refills | Status: DC

## 2016-08-24 MED ORDER — INSULIN ASPART 100 UNIT/ML FLEXPEN: 35.0000 [IU] | PEN_INJECTOR | Freq: Three times a day (TID) | SUBCUTANEOUS | 3 refills | Status: DC

## 2016-08-25 ENCOUNTER — Telehealth: Payer: Self-pay

## 2016-08-25 NOTE — Progress Notes (Signed)
Pt here for ppd. Ppd placed in left arm.  Pt informed to return in 77 to 72 hours to have site read. Jackelyn Knife, RMA

## 2016-08-25 NOTE — Telephone Encounter (Signed)
Signed application for CPAP faxed to [employer] - CPAP assistance program  - fax # [phone]

## 2016-08-25 NOTE — Progress Notes (Signed)
PPD Reading Note PPD read and results entered in [place]. Result: 0 mm induration. Interpretation: negative Jackelyn Knife, RMA

## 2016-08-25 NOTE — Telephone Encounter (Signed)
Application for CPAP machine from [employer] - CPAP Assistance Program  sent to Dr Chad Cordial for signature.

## 2016-08-26 ENCOUNTER — Telehealth: Payer: Self-pay

## 2016-08-26 NOTE — Telephone Encounter (Signed)
This Case Manager placed call to [employer]-CPAP Assistance Program to determine if they received application and to pay program fee. Call placed to [phone]; voicemail left requesting return call.

## 2016-08-27 ENCOUNTER — Telehealth: Payer: Self-pay

## 2016-08-27 NOTE — Telephone Encounter (Signed)
This Case Manager placed an additional call to [employer]-CPAP Assistance Program to determine if they received application and to pay program fee. Call placed to [phone]; voicemail left requesting return call.

## 2016-08-28 ENCOUNTER — Telehealth: Payer: Self-pay

## 2016-08-28 NOTE — Telephone Encounter (Signed)
Voicemail received from [employer] returning this Case Manager's call. Return call made; spoke with Mateo Flow who indicated they received patient's CPAP Assistance Program application. Program fee paid with permission of Director. Mateo Flow indicated CPAP would be shipped today. No additional needs identified.

## 2016-09-04 ENCOUNTER — Telehealth: Payer: Self-pay

## 2016-09-04 NOTE — Telephone Encounter (Signed)
CPAP machine received from [employer]. Call placed to Baxter Flattery [phone]) with [facility] to determine availability for Respiratory Therapist to provide CPAP teaching to patient. Unable to reach; voicemail left requesting return call. An additional call placed to Garfield Cornea, [place] [phone]) to determine availability for Respiratory Therapist to provide CPAP teaching to patient. Unable to reach; voicemail left requesting return call.

## 2016-09-08 ENCOUNTER — Other Ambulatory Visit: Payer: Self-pay | Admitting: Internal Medicine

## 2016-09-08 ENCOUNTER — Ambulatory Visit: Payer: Self-pay | Attending: Internal Medicine | Admitting: Pharmacist

## 2016-09-08 DIAGNOSIS — E1140 Type 2 diabetes mellitus with diabetic neuropathy, unspecified: Secondary | ICD-10-CM | POA: Insufficient documentation

## 2016-09-08 DIAGNOSIS — IMO0002 Reserved for concepts with insufficient information to code with codable children: Secondary | ICD-10-CM

## 2016-09-08 DIAGNOSIS — E1165 Type 2 diabetes mellitus with hyperglycemia: Secondary | ICD-10-CM | POA: Insufficient documentation

## 2016-09-08 LAB — POCT GLYCOSYLATED HEMOGLOBIN (HGB A1C): HEMOGLOBIN A1C: 8.8

## 2016-09-08 LAB — GLUCOSE, POCT (MANUAL RESULT ENTRY): POC GLUCOSE: 410 mg/dL — AB (ref 70–99)

## 2016-09-08 MED ORDER — TRUE METRIX METER W/DEVICE KIT: PACK | 0 refills | Status: DC

## 2016-09-08 MED FILL — METOPROLOL SUCC ER 200 MG T: 200 | 30 days supply | Qty: 30 | Fill #2

## 2016-09-08 MED FILL — LISINOPRIL 40 MG TABLET: 40 | 30 days supply | Qty: 30 | Fill #1

## 2016-09-08 MED FILL — ATORVASTATIN 40 MG TABLET: 40 | 30 days supply | Qty: 30 | Fill #2

## 2016-09-08 MED FILL — TRUE METRIX BLOOD GLUCOSE M: W/DEVICE | 365 days supply | Qty: 1 | Fill #0

## 2016-09-08 MED FILL — GABAPENTIN 300 MG CAPSULE: 300 | 30 days supply | Qty: 120 | Fill #0

## 2016-09-08 NOTE — Progress Notes (Signed)
    S:    Patient arrives in good spirits.  Presents for diabetes evaluation, education, and management at the request of Dr. Janne Napoleon. Patient was referred on 08/17/16.  Patient was last seen by Primary Care Provider on 08/17/16.   Patient reports adherence with medications.  Current diabetes medications include: Lantus 70 units daily and Novolog 30 units TID  Patient reports subjective hypoglycemic events. She will be shaky and tired and drinks coke and then feels better. She does not check her blood glucose at these times. No episodes of loss of consciousness or s/sx of severe hypoglycemia.  Patient reported dietary habits: doesn't follow a specific diet  Patient reported exercise habits: none   Patient reports nocturia.  Patient reports neuropathy. Patient reports visual changes. Patient reports self foot exams.   Patient reports that she lost her blood glucose meter so she hasn't checked her blood glucose in a while. Patient adamantly wants A1c despite being told that it is only measured every 3 months.    O:  Lab Results  Component Value Date   HGBA1C 8.8 09/08/2016   There were no vitals filed for this visit.  Home fasting CBG: doesn't monitor 2 hour post-prandial/random CBG: doesn't monitor  A/P: Diabetes longstanding currently UNcontrolled based on A1c of 8.8 which is down from 9.4 a few weeks ago. Patient reports subjective hypoglycemic events and is able to verbalize appropriate hypoglycemia management plan. Patient reports adherence with medication. Control is suboptimal due to sedentary lifestyle.  Continue current medications as prescribed. Ordered new meter so that patient can check CBGs at home. I am unsure if she is having relative hypoglycemia or true hypoglycemia so I asked that she check her blood glucose when she has these episodes and if <80 to let us know immediately so we can adjust her insulin.   Next A1C anticipated March 2018 - explained to patient  that this is only obtained every 3 months but that we could check her glucose. She was adamant that she received A1c so one was taken today.  Patient unwilling to stay in visit before completion as she needed to get somewhere. Patient verbalized that she desired to end visit without further evaluation/treatment.  Written patient instructions provided.  Total time in face to face counseling 10 minutes.   Follow up in Pharmacist Clinic Visit in 2 weeks after getting home readings of CBGs or sooner if hypoglycemic event occurs.

## 2016-09-09 ENCOUNTER — Other Ambulatory Visit: Payer: Self-pay

## 2016-09-09 ENCOUNTER — Telehealth: Payer: Self-pay | Admitting: Internal Medicine

## 2016-09-09 ENCOUNTER — Telehealth: Payer: Self-pay

## 2016-09-09 MED ORDER — GLUCOSE BLOOD VI STRP: ORAL_STRIP | 12 refills | Status: DC

## 2016-09-09 MED FILL — TRUE METRIX TEST STRIP: 30 days supply | Qty: 100 | Fill #0

## 2016-09-09 NOTE — Telephone Encounter (Signed)
Patient needs rx for test strips sent to pharmacy.

## 2016-09-09 NOTE — Telephone Encounter (Signed)
Test strips sent by [facility]

## 2016-09-09 NOTE — Telephone Encounter (Signed)
This Case Manager placed an additional call to Baxter Flattery with [facility] to determine availability for Respiratory Therapist to provide CPAP teaching to patient. Baxter Flattery indicated there is availability on 09/18/16 at 0900, 1000, and 1100. Call placed to patient to discuss; however, unable to reach patient. HIPPA compliant voicemail left requesting return call.

## 2016-09-09 NOTE — Telephone Encounter (Signed)
Missed call noted from patient. This Case Manager placed an additional call to patient to discuss scheduling CPAP teaching. Call placed to [phone]; however, unable to reach patient. HIPPA compliant voicemail left requesting return call.

## 2016-09-10 ENCOUNTER — Telehealth: Payer: Self-pay

## 2016-09-10 NOTE — Telephone Encounter (Signed)
This Case Manager placed an additional call to patient to inform that CPAP machine has been delivered to clinic and to arrange time for patient to receive mask fitting and CPAP teaching. Call placed to [phone]; however, unable to reach patient. HIPPA compliant voicemail left requesting return call.

## 2016-09-16 ENCOUNTER — Telehealth: Payer: Self-pay

## 2016-09-16 NOTE — Telephone Encounter (Signed)
Attempted x 5 to contact the patient to discuss scheduling an appointment for CPAP instruction. Calls place to [phone] [facility] each time the message noted that the patient was not able to receive calls at this time and then noted that all circuits are busy.

## 2016-09-17 ENCOUNTER — Telehealth: Payer: Self-pay

## 2016-09-17 NOTE — Telephone Encounter (Signed)
This Case Manager received message from [facility] that patient requesting return call to [phone]. Return call placed to patient. Informed patient that her CPAP machine had been delivered to clinic and a time needed to be scheduled for CPAP mask fitting and teaching.  Inquired if patient could come to clinic on 09/18/16 for teaching. Patient uncertain and indicated she was "not feeling well" and having chest pain. Patient indicated the pain was not radiating into arms. Instructed patient to get to ED immediately for evaluation of chest pain. Patient verbalized understanding.

## 2016-09-17 NOTE — Telephone Encounter (Signed)
This Case Manager placed call to Baxter Flattery with [facility] who confirmed that CPAP teaching can still be done on 09/18/16 at 0900, 1000, or 1100. Call placed to [phone]; however, unable to leave voicemail as recording indicated patient "not able to receive calls at this time." Call placed to emergency contact, Dorinda Bowe, and left HIPPA compliant message requesting patient return call to Case Manager when possible.

## 2016-09-22 ENCOUNTER — Telehealth: Payer: Self-pay

## 2016-09-22 NOTE — Telephone Encounter (Signed)
Call placed to [facility] with [facility] to inquire about availability for CPAP teaching. She noted that they have availability on 09/28/16 @ 1300 and 09/29/16, 10/01/16 and 10/02/16 @ 100 and 1300.   Attempted to contact the patient to check on her status and to discuss scheduling CPAP teaching. Calls placed to # [phone] (H) and  # [phone] (M) and HIPAA compliant voicemail messages were left at each number requesting a call back to # [phone] or [phone].

## 2016-09-23 ENCOUNTER — Telehealth: Payer: Self-pay

## 2016-09-23 NOTE — Telephone Encounter (Signed)
This Case Manager placed call to patient to discuss scheduling CPAP teaching. Call placed to [phone]; however, unable to reach patient. HIPPA compliant voicemail left requesting return call. An additional call placed to [phone]; unable to reach patient. Awaiting return call.

## 2016-09-24 ENCOUNTER — Ambulatory Visit: Payer: Self-pay | Attending: Internal Medicine | Admitting: *Deleted

## 2016-09-24 ENCOUNTER — Telehealth: Payer: Self-pay | Admitting: Internal Medicine

## 2016-09-24 VITALS — BP 145/88 | HR 81 | Resp 16

## 2016-09-24 DIAGNOSIS — I10 Essential (primary) hypertension: Secondary | ICD-10-CM

## 2016-09-24 NOTE — Telephone Encounter (Signed)
Ok

## 2016-09-24 NOTE — Progress Notes (Addendum)
This Case Manager met with patient while in clinic for a blood pressure check. Informed patient that CPAP teaching and mask fitting needs to be arranged. Inquired if patient able to come in next week on 10/01/16 for CPAP teaching. Patient indicated she does not plan that far ahead. Call placed to [facility] to determine if there is a Respiratory Therapist available to provide mask fitting and CPAP teaching today. Spoke with Baxter Flattery who indicated there was not a Statistician available today to provide teaching. She indicated teaching could be done on 09/28/16 at 1400. Discussed with patient who indicated she does not plan that far ahead. Will try to arrange CPAP teaching when patient has a follow-up appointment with Dr. Prudencio Pair. Also informed patient to call this Case Manager when she has availability for CPAP teaching.  Addendum:  Will route encounter to Dr. Janne Napoleon and Dr. Joya Gaskins, Pulmonologist to determine if benefit of patient receiving machine outweighs risk of her not receiving mask fitting or teaching on how to clean or use machine properly.

## 2016-09-24 NOTE — Progress Notes (Signed)
Pt arrived for BP check today. She is no longer taking amlodipine. She states she was given this medication and didn't realize it was a medication that she was allergic to so she stopped it. Attempted to schedule an appointment on Wednesday with Dr. Janne Napoleon at 11:15 but patient was unable to make this appointment. Pt states she went to the ED for high blood pressure and chest pain and she was told she needed to f/u with PCP. She is reluctant to schedule apt with another provider.  Case Management attempted to set patient up for CPAP but will have to reschedule.

## 2016-09-24 NOTE — Telephone Encounter (Signed)
Pt came in for a nurse visit today for a bp check Pt states she was recently in the ED for a possible heart attack and was instructed to f/u with PCP as soon as possible  Are we able to double book you on Monday or Tuesday of next week for a HFU visit? Pt was offered next Wednesday but is unavailable

## 2016-09-25 ENCOUNTER — Other Ambulatory Visit: Payer: Self-pay | Admitting: *Deleted

## 2016-09-25 DIAGNOSIS — E038 Other specified hypothyroidism: Secondary | ICD-10-CM

## 2016-09-25 MED ORDER — LEVOTHYROXINE SODIUM 100 MCG PO TABS: 100.0000 ug | ORAL_TABLET | Freq: Every day | ORAL | 1 refills | Status: DC

## 2016-09-25 MED ORDER — MOMETASONE FUROATE 50 MCG/ACT NA SUSP: 2.0000 | Freq: Every day | NASAL | 3 refills | Status: DC

## 2016-09-25 NOTE — Progress Notes (Signed)
She needs the cpap teaching prior to receiving machine.  Useless if used incorrectly. And wasteful. Thanks for all the help.

## 2016-09-28 ENCOUNTER — Telehealth: Payer: Self-pay

## 2016-09-28 NOTE — Telephone Encounter (Signed)
Attempted to contact the patient to discuss scheduling CPAP teaching tomorrow after her appointment with Dr Janne Napoleon - 09/30/15 @ 0900.  Call placed to # [phone] and a HIPAA compliant voicemail message was left requesting a call back to # [phone] or [phone].  Call then placed to # [phone], the person answered stating she was the patient but would not confirm her birthday.  Informed her that this was [facility] and she asked about the time for her appointment tomorrow. When this CM asked her birthday again, she hung up.

## 2016-09-29 ENCOUNTER — Ambulatory Visit: Payer: Self-pay | Admitting: Internal Medicine

## 2016-09-29 ENCOUNTER — Telehealth: Payer: Self-pay

## 2016-09-29 NOTE — Telephone Encounter (Signed)
The patient arrived at her appointment today and was expecting CPAP teaching. The teaching was not scheduled for today as the patient hung up the phone on this CM yesterday when she was contacted in attempt to discuss scheduling teaching.   Call placed to [facility] with [facility] who stated that they do not have any availability today to do teaching. An appointment was scheduled for the patient for CPAP teaching on 10/02/16 @ 1130.   The patient was informed of the date/time of teaching as it can't be done today.  She said that she was not sure that she would be able to make it. This CM informed her that she would be contacted on Thursday, 10/01/16 and the CPAP teaching appointment would need to be confirmed at that time, or the appointment would be cancelled. She again noted that she was not sure that she would be able to make the appointment.

## 2016-09-30 ENCOUNTER — Ambulatory Visit: Payer: Self-pay | Admitting: Internal Medicine

## 2016-10-01 ENCOUNTER — Telehealth: Payer: Self-pay

## 2016-10-01 NOTE — Telephone Encounter (Signed)
This Case Manager received return call from [facility] with [facility]. Informed her that this Case Manager has been unable to confirm with patient that she will be coming to clinic on 10/02/16 for CPAP teaching. Baxter Flattery informed this Case Manager to let her know if patient calls back to confirm teaching. However, she indicated if patient has not confirmed teaching by 0900 on 10/02/16 she will have to cancel teaching. This Case Manager verbalized understanding and informed Baxter Flattery with [facility] that this Case Manager will contact her if patient confirms appointment.

## 2016-10-01 NOTE — Telephone Encounter (Signed)
This Case Manager placed an additional call to patient to determine if patient able to come to clinic for CPAP teaching tomorrow at 1130. An additional call placed to [phone]; however, unable to reach patient. HIPPA compliant voicemail left requesting return call. An additional call placed to [phone]; however, unable to reach patient. Unable to leave voicemail.  This Case Manager placed call to Baxter Flattery with [facility] to inform her that this Case Manager could not reach patient so CPAP teaching tomorrow could not be confirmed. Unable to reach; voicemail left requesting return call.

## 2016-10-01 NOTE — Telephone Encounter (Signed)
This Case Manager placed call to patient to confirm CPAP teaching on 10/02/16 at 1130. Call placed to [phone]; however, unable to reach patient. HIPPA compliant voicemail left requesting return call. An additional call placed to [phone]; however, unable to reach patient. Awaiting return call from patient.

## 2016-10-05 ENCOUNTER — Ambulatory Visit: Payer: Self-pay | Admitting: Internal Medicine

## 2016-10-08 ENCOUNTER — Other Ambulatory Visit: Payer: Self-pay | Admitting: Family Medicine

## 2016-10-08 DIAGNOSIS — E1140 Type 2 diabetes mellitus with diabetic neuropathy, unspecified: Secondary | ICD-10-CM

## 2016-10-08 MED FILL — ATORVASTATIN 40 MG TABLET: 40 | 30 days supply | Qty: 30 | Fill #3

## 2016-10-08 MED FILL — ?LISINOPRIL 40 MG TABLET: 40 MG | 30 days supply | Qty: 30 | Fill #2

## 2016-10-08 MED FILL — METOPROLOL SUCC ER 200 MG T: 200 | 30 days supply | Qty: 30 | Fill #3

## 2016-10-09 MED FILL — GABAPENTIN 300 MG CAPSULE: 300 | 30 days supply | Qty: 120 | Fill #0

## 2016-10-12 ENCOUNTER — Telehealth: Payer: Self-pay | Admitting: Internal Medicine

## 2016-10-12 DIAGNOSIS — IMO0002 Reserved for concepts with insufficient information to code with codable children: Secondary | ICD-10-CM

## 2016-10-12 DIAGNOSIS — E1165 Type 2 diabetes mellitus with hyperglycemia: Secondary | ICD-10-CM

## 2016-10-12 DIAGNOSIS — E038 Other specified hypothyroidism: Secondary | ICD-10-CM

## 2016-10-12 DIAGNOSIS — E1140 Type 2 diabetes mellitus with diabetic neuropathy, unspecified: Secondary | ICD-10-CM

## 2016-10-12 DIAGNOSIS — K219 Gastro-esophageal reflux disease without esophagitis: Secondary | ICD-10-CM

## 2016-10-12 NOTE — Telephone Encounter (Signed)
Patient is requesting celebrex to be filled and sent to health dept... Please follow up

## 2016-10-12 NOTE — Telephone Encounter (Signed)
Patient will be coming in on Monday for an appointment 10/19/16 @ 11:30 am...Marland KitchenMarland KitchenMarland Kitchen Please follow up

## 2016-10-12 NOTE — Telephone Encounter (Signed)
Patient is requesting prescription refills for Dexilant, Novolog, Lantus, Synthroid.... Please send to the health dept.Marland KitchenMarland KitchenMarland Kitchen

## 2016-10-13 ENCOUNTER — Telehealth: Payer: Self-pay | Admitting: Internal Medicine

## 2016-10-13 ENCOUNTER — Telehealth: Payer: Self-pay

## 2016-10-13 MED ORDER — INSULIN GLARGINE 100 UNIT/ML SOLOSTAR PEN: 70.0000 [IU] | PEN_INJECTOR | Freq: Every day | SUBCUTANEOUS | 5 refills | Status: DC

## 2016-10-13 MED ORDER — CELECOXIB 50 MG PO CAPS: 50.0000 mg | ORAL_CAPSULE | Freq: Two times a day (BID) | ORAL | 0 refills | Status: DC | PRN

## 2016-10-13 MED ORDER — INSULIN ASPART 100 UNIT/ML FLEXPEN: 35.0000 [IU] | PEN_INJECTOR | Freq: Three times a day (TID) | SUBCUTANEOUS | 3 refills | Status: DC

## 2016-10-13 MED ORDER — DEXLANSOPRAZOLE 30 MG PO CPDR: 30.0000 mg | DELAYED_RELEASE_CAPSULE | Freq: Every day | ORAL | 3 refills | Status: DC

## 2016-10-13 MED ORDER — LEVOTHYROXINE SODIUM 100 MCG PO TABS: 100.0000 ug | ORAL_TABLET | Freq: Every day | ORAL | 0 refills | Status: DC

## 2016-10-13 NOTE — Telephone Encounter (Signed)
Call placed to Baxter Flattery with [facility] to schedule CPAP teaching for the patient on 10/19/16 when she is at the [facility] to see Dr Janne Napoleon. Teaching scheduled for 10/19/16 @ 1200.  Call placed to the patient to inform her of the CPAP teaching on 10/19/16 and she stated that she will not be in town that day and needs to cancel her appointment with Dr Janne Napoleon and will need to re-schedule the appointment with Dr Janne Napoleon and for CPAP teaching. Informed her that this CM will need to check with [facility] scheduler regarding availability.  The patient stated that she will only see a doctor.   This CM spoke to Shelda Jakes, [facility] front office team lead regarding rescheduling the patient's appointment. She stated that that there is no availability at this time. The patient will need to call and reschedule another appointment.   Call placed to [facility] with [facility] and cancelled the CPAP teaching appointment for 10/19/16.  Call placed to the patient and informed her that as per Shelda Jakes, there is no availability to reschedule with a doctor at this time. Instructed her to call the clinic to reschedule.  She expressed her frustrations with trying to schedule appointments and lack of availability of providers.  Offered apologies and encouraged her to call at a later time and reschedule her appointment and we will try to schedule CPAP teaching at the same time.  Instructed her that if she feels that she needs to see a provider and the situation warrants immediate attention, she should go to ED.  She then hung up the phone.

## 2016-10-13 NOTE — Telephone Encounter (Signed)
Requested medications sent to [facility].

## 2016-10-13 NOTE — Telephone Encounter (Signed)
I gave her a short 2 wk course, take 1 tab bid prn, prescription to use as needed for acute pain. I will NOT renew this in future given long term use can cause adverse effects, such as risk for heart attacks and gut bleeding.  She needs to pick up rx since cannot be e-scribed.

## 2016-10-13 NOTE — Telephone Encounter (Signed)
Arena, from the [facility], called to speak with nurse regarding orders that were placed for patient and a call that the patient made to them. Please follow up.   Thank you.

## 2016-10-14 ENCOUNTER — Other Ambulatory Visit: Payer: Self-pay | Admitting: Internal Medicine

## 2016-10-14 DIAGNOSIS — N644 Mastodynia: Secondary | ICD-10-CM

## 2016-10-14 NOTE — Telephone Encounter (Signed)
Spoke with Dr. Janne Napoleon and she added the orders for the b/l mammogram and the left breast ultrasound

## 2016-10-14 NOTE — Telephone Encounter (Signed)
Contacted pt verified DOB explained to patient about the rx that Dr. Janne Napoleon prescribed pt states the rx was originally [facility] at the [facility] department I informed pt tht the rx could not be e-scribed and she would have to pick it up pt hung up tried to contact pt again pt didn't answer

## 2016-10-19 ENCOUNTER — Ambulatory Visit: Payer: Self-pay | Admitting: Internal Medicine

## 2016-10-21 ENCOUNTER — Ambulatory Visit: Payer: Self-pay

## 2016-10-27 ENCOUNTER — Telehealth: Payer: Self-pay | Admitting: Internal Medicine

## 2016-10-27 NOTE — Telephone Encounter (Signed)
Patient called today stating she had an appt.., per our notes she was called twice to reschedule and she did not return our call..... Patient became upset and ended call.....  Patient has been scheduled multiple times and has no show multiple times.

## 2016-10-29 ENCOUNTER — Encounter (HOSPITAL_COMMUNITY): Payer: Self-pay | Admitting: Emergency Medicine

## 2016-10-29 ENCOUNTER — Ambulatory Visit: Payer: Self-pay | Admitting: Physician Assistant

## 2016-10-29 ENCOUNTER — Ambulatory Visit (HOSPITAL_COMMUNITY)
Admission: EM | Admit: 2016-10-29 | Discharge: 2016-10-29 | Disposition: A | Discharge status: Home / Self Care | Payer: Self-pay | Attending: Emergency Medicine | Admitting: Emergency Medicine

## 2016-10-29 DIAGNOSIS — M792 Neuralgia and neuritis, unspecified: Secondary | ICD-10-CM

## 2016-10-29 MED ORDER — VALACYCLOVIR HCL 1 G PO TABS: 1000.0000 mg | ORAL_TABLET | Freq: Two times a day (BID) | ORAL | 0 refills | Status: AC

## 2016-10-29 MED FILL — VOLTAREN 1% GEL: 1 | 6 days supply | Qty: 100 | Fill #0

## 2016-10-29 MED FILL — ?VALACYCLOVIR 1 GRAM TAB: 1000 MG | 7 days supply | Qty: 14 | Fill #0

## 2016-10-29 NOTE — Discharge Instructions (Signed)
I am unable to increase your gabapentin dose as you are taking the maximum daily dose of 1200 mg a day. I have written an antiviral to cover for shingles, however without a rash it is not possible to confirm the diagnosis, and with the length of time you have had your symptoms a rash should have occurred. Other possibilities for similar pain could be a pinched nerve. Should your pain persist follow up with your primary care provider or return to clinic.

## 2016-10-29 NOTE — ED Provider Notes (Signed)
CSN: [id number]     Arrival date & time 10/29/16  4403 History   First MD Initiated Contact with Patient 10/29/16 1014     Chief Complaint  Patient presents with  . Chest Pain   (Consider location/radiation/quality/duration/timing/severity/associated sxs/prior Treatment) 2 week history of constant left sided pain extending from her breast to her axiliary area, states it is burning, tingling, and stabbing in nature, states she has not been wearing her bra and has been sleeping naked due to pain when anything touches her skin. She had similar pain 1 month ago, was seen in the [facility], had full cardiac work up. She states she believes she has singles, but has denied history of rash.   The history is provided by the patient.  Chest Pain  Pain location:  L chest Pain quality: burning, sharp and shooting   Pain radiates to:  Does not radiate Pain severity:  Severe Onset quality:  Gradual Duration:  3 weeks Timing:  Constant Progression:  Unchanged Chronicity:  New Context comment:  Constant, worse when skin touched Relieved by:  Nothing Worsened by:  Nothing Ineffective treatments:  Antacids, aspirin and rest Associated symptoms: numbness   Associated symptoms: no cough, no dizziness, no fatigue, no heartburn, no lower extremity edema, no nausea, no near-syncope, no palpitations, no shortness of breath and no vomiting   Risk factors: diabetes mellitus, hypertension and obesity     Past Medical History:  Diagnosis Date  . Anxiety   . Carpal tunnel syndrome 06/14/2014  . Chest pain   . CHF (congestive heart failure) ([place])   . Chronic back pain   . Chronic kidney disease 03/2013   failure due to meds  . Diabetes mellitus type II, uncontrolled ([place])   . Diverticulitis   . Frequent headaches   . Hiatal hernia   . Hyperlipemia   . Hypertension   . Hypothyroidism   . 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 Neuropathy ([place])   . Obesity   . Pneumonia   . Vitamin D deficiency 06/05/2014    Past Surgical History:  Procedure Laterality Date  . CESAREAN SECTION    . KNEE SURGERY Left   . THYROID SURGERY     goiter   Family History  Problem Relation Age of Onset  . Cancer Mother     cervical- mets  . Cancer Father   . Heart disease Father   . Diabetes Father   . Diabetes Sister   . Heart disease Sister   . Heart disease Sister   . COPD Sister    Social History  Substance Use Topics  . Smoking status: Never Smoker  . Smokeless tobacco: Never Used  . Alcohol use No   OB History    Gravida Para Term Preterm AB Living   '4 3 2 1 1 6   ' SAB TAB Ectopic Multiple Live Births       '1 2 5     ' Review of Systems  Reason unable to perform ROS: as covered in HPI.  Constitutional: Negative for fatigue.  Respiratory: Negative for cough and shortness of breath.   Cardiovascular: Positive for chest pain. Negative for palpitations and near-syncope.  Gastrointestinal: Negative for heartburn, nausea and vomiting.  Neurological: Positive for numbness. Negative for dizziness.  All other systems reviewed and are negative.   Allergies  Amoxicillin-pot clavulanate; Sulfa antibiotics; Azithromycin; and Norvasc [amlodipine besylate]  Home Medications   Prior to Admission medications   Medication Sig Start Date End Date Taking?  Authorizing Provider  atorvastatin (LIPITOR) 40 MG tablet Take 1 tablet (40 mg total) by mouth daily. 08/17/16  Yes Maren Reamer, MD  celecoxib (CELEBREX) 50 MG capsule Take 1 capsule (50 mg total) by mouth 2 (two) times daily as needed for pain. 10/13/16  Yes Maren Reamer, MD  Cetirizine HCl 10 MG CAPS Take 1 capsule (10 mg total) by mouth daily. 05/28/16  Yes Maren Reamer, MD  cyclobenzaprine (FLEXERIL) 5 MG tablet Take 1 tablet (5 mg total) by mouth 3 (three) times daily as needed for muscle spasms. 01/13/16  Yes Arnoldo Morale, MD  Dexlansoprazole (DEXILANT) 30 MG capsule Take 1 capsule (30 mg total) by mouth daily. 10/13/16  Yes Maren Reamer, MD  gabapentin (NEURONTIN) 300 MG capsule Take 2 capsules (600 mg total) by mouth 2 (two) times daily. 10/08/16  Yes Maren Reamer, MD  insulin aspart (NOVOLOG) 100 UNIT/ML FlexPen Inject 35 Units into the skin 3 (three) times daily with meals. 10/13/16  Yes Maren Reamer, MD  Insulin Glargine (LANTUS SOLOSTAR) 100 UNIT/ML Solostar Pen Inject 70 Units into the skin daily at 10 pm. 10/13/16  Yes Maren Reamer, MD  levothyroxine (SYNTHROID, LEVOTHROID) 100 MCG tablet Take 1 tablet (100 mcg total) by mouth daily before breakfast. 10/13/16  Yes Maren Reamer, MD  lisinopril (PRINIVIL,ZESTRIL) 40 MG tablet Take 1 tablet (40 mg total) by mouth daily. 08/17/16  Yes Maren Reamer, MD  loratadine (CLARITIN) 10 MG tablet Take 1 tablet (10 mg total) by mouth daily. 04/06/16  Yes Maren Reamer, MD  metoprolol (TOPROL-XL) 200 MG 24 hr tablet Take 1 tablet (200 mg total) by mouth daily. 08/17/16  Yes Dawn Lazarus Gowda, MD  mometasone (NASONEX) 50 MCG/ACT nasal spray Place 2 sprays into the nose daily. 09/25/16  Yes Dawn Lazarus Gowda, MD  mometasone (NASONEX) 50 MCG/ACT nasal spray Place 2 sprays into the nose daily. 09/25/16  Yes Maren Reamer, MD  Multiple Vitamins-Minerals (EYE VITAMINS) CAPS Take 1 capsule by mouth daily. 08/11/12  Yes Thurnell Lose, MD  traMADol (ULTRAM) 50 MG tablet Take 1 tablet (50 mg total) by mouth every 6 (six) hours as needed for moderate pain. 07/30/16  Yes Gerda Diss, DO  Blood Glucose Monitoring Suppl (TRUE METRIX METER) w/Device KIT Use as instructed 09/08/16   Tresa Garter, MD  diclofenac sodium (VOLTAREN) 1 % GEL Apply 4 g topically 4 (four) times daily as needed (Pain). 07/29/16   Shawn C Joy, PA-C  glucose blood (TRUE METRIX BLOOD GLUCOSE TEST) test strip Check glucose twice a day 09/09/16   Maren Reamer, MD  guaiFENesin (MUCINEX) 600 MG 12 hr tablet Take 1 tablet (600 mg total) by mouth 2 (two) times daily as needed. 05/28/16   Maren Reamer, MD  levofloxacin (LEVAQUIN) 500 MG tablet Take 1 tablet (500 mg total) by mouth daily. 05/28/16   Maren Reamer, MD  liraglutide 18 MG/3ML SOPN 0.6 mg subcutaneously daily for1 week, then 1.2 mg daily for 1 week, then 1.8 mg daily subsequently. 08/17/16   Maren Reamer, MD  valACYclovir (VALTREX) 1000 MG tablet Take 1 tablet (1,000 mg total) by mouth 2 (two) times daily. 10/29/16 11/05/16  Barnet Glasgow, NP   Meds Ordered and Administered this Visit  Medications - No data to display  BP 147/83 (BP Location: Left Arm)   Pulse 80   Temp 97.5 F (36.4 C) (Oral)   Resp 20  SpO2 95%  No data found.   Physical Exam  Constitutional: She is oriented to person, place, and time. She appears well-developed and well-nourished. No distress.  HENT:  Head: Normocephalic.  Right Ear: External ear normal.  Left Ear: External ear normal.  Neck: Normal range of motion. Neck supple. No JVD present.  Cardiovascular: Normal rate, regular rhythm and normal heart sounds.   Pulmonary/Chest: Effort normal and breath sounds normal. No respiratory distress. She has no wheezes. She has no rales. She exhibits tenderness. She exhibits no deformity and no retraction.  Tenderness along left breast, chest, and left axillary area.  Abdominal: Soft. Bowel sounds are normal.  Lymphadenopathy:    She has no cervical adenopathy.  Neurological: She is alert and oriented to person, place, and time.  Skin: Skin is warm and dry. Capillary refill takes less than 2 seconds. She is not diaphoretic.  Psychiatric: She has a normal mood and affect.  Nursing note and vitals reviewed.   Urgent Care Course     Procedures (including critical care time)  Labs Review Labs Reviewed - No data to display  Imaging Review No results found.   Visual Acuity Review  Right Eye Distance:   Left Eye Distance:   Bilateral Distance:    Right Eye Near:   Left Eye Near:    Bilateral Near:         MDM   1.  Neuropathic pain   I am unable to increase your gabapentin dose as you are taking the maximum daily dose of 1200 mg a day. I have written an antiviral to cover for shingles, however without a rash it is not possible to confirm the diagnosis, and with the length of time you have had your symptoms a rash should have occurred. Other possibilities for similar pain could be a pinched nerve. Should your pain persist follow up with your primary care provider or return to clinic.     Barnet Glasgow, NP 10/29/16 1043

## 2016-10-29 NOTE — ED Triage Notes (Signed)
Pt is here for persistent left sided CP onset 2-3 weeks that radiates to left shoulder  Reports pain is throbbing, sharp, constant, stabbing and also feeling nauseas.   Reports she was seen at her local ED in [place] for similar sx  Pt believes it may be shingles since she had similar sx in the past  She is A&O x4... NAD

## 2016-11-04 ENCOUNTER — Ambulatory Visit: Payer: Self-pay | Attending: Internal Medicine | Admitting: Internal Medicine

## 2016-11-04 ENCOUNTER — Other Ambulatory Visit: Payer: Self-pay

## 2016-11-04 VITALS — BP 145/84 | HR 79 | Temp 98.0°F | Resp 18 | Ht 66.0 in | Wt 282.8 lb

## 2016-11-04 DIAGNOSIS — G629 Polyneuropathy, unspecified: Secondary | ICD-10-CM | POA: Insufficient documentation

## 2016-11-04 DIAGNOSIS — Z794 Long term (current) use of insulin: Secondary | ICD-10-CM | POA: Insufficient documentation

## 2016-11-04 DIAGNOSIS — E039 Hypothyroidism, unspecified: Secondary | ICD-10-CM | POA: Insufficient documentation

## 2016-11-04 DIAGNOSIS — F419 Anxiety disorder, unspecified: Secondary | ICD-10-CM | POA: Insufficient documentation

## 2016-11-04 DIAGNOSIS — M792 Neuralgia and neuritis, unspecified: Secondary | ICD-10-CM

## 2016-11-04 DIAGNOSIS — Z880 Allergy status to penicillin: Secondary | ICD-10-CM | POA: Insufficient documentation

## 2016-11-04 DIAGNOSIS — M25512 Pain in left shoulder: Secondary | ICD-10-CM | POA: Insufficient documentation

## 2016-11-04 DIAGNOSIS — N189 Chronic kidney disease, unspecified: Secondary | ICD-10-CM | POA: Insufficient documentation

## 2016-11-04 DIAGNOSIS — I130 Hypertensive heart and chronic kidney disease with heart failure and stage 1 through stage 4 chronic kidney disease, or unspecified chronic kidney disease: Secondary | ICD-10-CM | POA: Insufficient documentation

## 2016-11-04 DIAGNOSIS — I509 Heart failure, unspecified: Secondary | ICD-10-CM | POA: Insufficient documentation

## 2016-11-04 DIAGNOSIS — Z79899 Other long term (current) drug therapy: Secondary | ICD-10-CM | POA: Insufficient documentation

## 2016-11-04 DIAGNOSIS — E1122 Type 2 diabetes mellitus with diabetic chronic kidney disease: Secondary | ICD-10-CM | POA: Insufficient documentation

## 2016-11-04 DIAGNOSIS — I10 Essential (primary) hypertension: Secondary | ICD-10-CM

## 2016-11-04 DIAGNOSIS — K219 Gastro-esophageal reflux disease without esophagitis: Secondary | ICD-10-CM | POA: Insufficient documentation

## 2016-11-04 MED ORDER — ACYCLOVIR 5 % EX OINT: 1.0000 "application " | TOPICAL_OINTMENT | CUTANEOUS | 3 refills | Status: DC

## 2016-11-04 MED ORDER — KETOROLAC TROMETHAMINE 30 MG/ML IJ SOLN
30.0000 mg | Freq: Once | INTRAMUSCULAR | Status: AC
  Administered 2016-11-04: 30 mg via INTRAMUSCULAR

## 2016-11-04 MED ORDER — ACYCLOVIR 5 % EX CREA: 1.0000 "application " | TOPICAL_CREAM | CUTANEOUS | 3 refills | Status: DC

## 2016-11-04 MED ORDER — PREDNISONE 20 MG PO TABS: 20.0000 mg | ORAL_TABLET | Freq: Every day | ORAL | 0 refills | Status: DC

## 2016-11-04 MED ORDER — TRAMADOL HCL 50 MG PO TABS: 50.0000 mg | ORAL_TABLET | Freq: Four times a day (QID) | ORAL | 0 refills | Status: DC | PRN

## 2016-11-04 MED FILL — ACYCLOVIR 5% OINTMENT: 5 | 7 days supply | Qty: 15 | Fill #0

## 2016-11-04 MED FILL — predniSONE 20 MG TABS: 20 | 5 days supply | Qty: 5 | Fill #0

## 2016-11-04 MED FILL — traMADol HCL 50 MG TABS: 50 | 15 days supply | Qty: 60 | Fill #0

## 2016-11-04 NOTE — Progress Notes (Signed)
Patient is here for shingles.

## 2016-11-04 NOTE — Progress Notes (Signed)
Anita James, is a 66 y.o. female  ZJI:[id number]  FBP:[id number]  DOB - 05-21-1951     Subjective:   Anita James is a 66 y.o. female with multiple medical history as listed below including diabetes, hypertension, anxiety disorder and hypothyroidism presented here today for a complaint of left sided upper body pain and ED visit follow up. Patient described the pain as a shooting pain from her upper left chest to left shoulder and upper back area, episodic, lancinating and excruciating. She is tearful during this encounter. She said it feels like her shingles but no visible rashes. This pain has been ongoing for over 3 weeks yet no outbreak of rash. She denies any fever, no redness, no swelling. She denies any trauma, no fall, no cough or SOB. [name], [name], [name].  Problem  Acute Pain of Left Shoulder  Neuropathic Pain  Gastroesophageal Reflux Disease Without Esophagitis  Essential Hypertension    ALLERGIES: Allergies  Allergen Reactions  . Amoxicillin-Pot Clavulanate Nausea And Vomiting and Other (See Comments)    Combination of medications tear lining of stomach  . Sulfa Antibiotics Shortness Of Breath  . Azithromycin   . Norvasc [Amlodipine Besylate] Swelling    feet    PAST MEDICAL HISTORY: Past Medical History:  Diagnosis Date  . Anxiety   . Carpal tunnel syndrome 06/14/2014  . Chest pain   . CHF (congestive heart failure) ([place])   . Chronic back pain   . Chronic kidney disease 03/2013   failure due to meds  . Diabetes mellitus type II, uncontrolled ([place])   . Diverticulitis   . Frequent headaches   . Hiatal hernia   . Hyperlipemia   . Hypertension   . Hypothyroidism   . 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 Neuropathy ([place])   . Obesity   . Pneumonia   . Vitamin D deficiency 06/05/2014    MEDICATIONS AT HOME: Prior to Admission medications   Medication Sig Start Date End Date Taking?  Authorizing Provider  acyclovir cream (ZOVIRAX) 5 % Apply 1 application topically every 4 (four) hours. 11/04/16   Tresa Garter, MD  atorvastatin (LIPITOR) 40 MG tablet Take 1 tablet (40 mg total) by mouth daily. 08/17/16   Maren Reamer, MD  Blood Glucose Monitoring Suppl (TRUE METRIX METER) w/Device KIT Use as instructed 09/08/16   Tresa Garter, MD  celecoxib (CELEBREX) 50 MG capsule Take 1 capsule (50 mg total) by mouth 2 (two) times daily as needed for pain. 10/13/16   Maren Reamer, MD  Cetirizine HCl 10 MG CAPS Take 1 capsule (10 mg total) by mouth daily. 05/28/16   Maren Reamer, MD  cyclobenzaprine (FLEXERIL) 5 MG tablet Take 1 tablet (5 mg total) by mouth 3 (three) times daily as needed for muscle spasms. 01/13/16   Arnoldo Morale, MD  Dexlansoprazole (DEXILANT) 30 MG capsule Take 1 capsule (30 mg total) by mouth daily. 10/13/16   Maren Reamer, MD  diclofenac sodium (VOLTAREN) 1 % GEL Apply 4 g topically 4 (four) times daily as needed (Pain). 07/29/16   Shawn C Joy, PA-C  gabapentin (NEURONTIN) 300 MG capsule Take 2 capsules (600 mg total) by mouth 2 (two) times daily. 10/08/16   Maren Reamer, MD  glucose blood (TRUE METRIX BLOOD GLUCOSE TEST) test strip Check glucose twice a day 09/09/16   Maren Reamer, MD  guaiFENesin (MUCINEX) 600 MG 12 hr  tablet Take 1 tablet (600 mg total) by mouth 2 (two) times daily as needed. 05/28/16   Maren Reamer, MD  insulin aspart (NOVOLOG) 100 UNIT/ML FlexPen Inject 35 Units into the skin 3 (three) times daily with meals. 10/13/16   Maren Reamer, MD  Insulin Glargine (LANTUS SOLOSTAR) 100 UNIT/ML Solostar Pen Inject 70 Units into the skin daily at 10 pm. 10/13/16   Maren Reamer, MD  levothyroxine (SYNTHROID, LEVOTHROID) 100 MCG tablet Take 1 tablet (100 mcg total) by mouth daily before breakfast. 10/13/16   Maren Reamer, MD  liraglutide 18 MG/3ML SOPN 0.6 mg subcutaneously daily for1 week, then 1.2 mg daily for 1 week, then  1.8 mg daily subsequently. 08/17/16   Maren Reamer, MD  lisinopril (PRINIVIL,ZESTRIL) 40 MG tablet Take 1 tablet (40 mg total) by mouth daily. 08/17/16   Maren Reamer, MD  loratadine (CLARITIN) 10 MG tablet Take 1 tablet (10 mg total) by mouth daily. 04/06/16   Maren Reamer, MD  metoprolol (TOPROL-XL) 200 MG 24 hr tablet Take 1 tablet (200 mg total) by mouth daily. 08/17/16   Maren Reamer, MD  mometasone (NASONEX) 50 MCG/ACT nasal spray Place 2 sprays into the nose daily. 09/25/16   Maren Reamer, MD  Multiple Vitamins-Minerals (EYE VITAMINS) CAPS Take 1 capsule by mouth daily. 08/11/12   Thurnell Lose, MD  predniSONE (DELTASONE) 20 MG tablet Take 1 tablet (20 mg total) by mouth daily with breakfast. 11/04/16   Tresa Garter, MD  traMADol (ULTRAM) 50 MG tablet Take 1 tablet (50 mg total) by mouth every 6 (six) hours as needed for moderate pain. 11/04/16   Tresa Garter, MD  valACYclovir (VALTREX) 1000 MG tablet Take 1 tablet (1,000 mg total) by mouth 2 (two) times daily. 10/29/16 11/05/16  Barnet Glasgow, NP    Objective:   Vitals:   11/04/16 1437  BP: (!) 145/84  Pulse: 79  Resp: 18  Temp: 98 F (36.7 C)  TempSrc: Oral  SpO2: 94%  Weight: 282 lb 12.8 oz (128.3 kg)  Height: '5\' 6"'  (1.676 m)   Exam General appearance : Awake, alert, not in any distress. Speech Clear. Not toxic looking, in some painful distress HEENT: Atraumatic and Normocephalic, pupils equally reactive to light and accomodation Neck: Supple, no JVD. No cervical lymphadenopathy.  Chest: Good air entry bilaterally, no added sounds. Tenderness is reproducible with touch.  CVS: S1 S2 regular, no murmurs.  Abdomen: Bowel sounds present, Non tender and not distended with no gaurding, rigidity or rebound. Extremities: B/L Lower Ext shows no edema, both legs are warm to touch Neurology: Awake alert, and oriented X 3, CN II-XII intact, Non focal Skin: No Rash  Data Review Lab Results  Component  Value Date   HGBA1C 8.8 09/08/2016   HGBA1C 9.4 08/17/2016   HGBA1C 7.5 02/28/2016    Assessment & Plan   1. Acute pain of left shoulder  There is no clinical evidence to suggest Shingles at this time, this was explained to patient but she insisted on getting the "cream" that works for shingles. She is convinced she has Shingles .  - acyclovir cream (ZOVIRAX) 5 %; Apply 1 application topically every 4 (four) hours.  Dispense: 15 g; Refill: 3  2. Essential hypertension  We have discussed target BP range and blood pressure goal. I have advised patient to check BP regularly and to call us back or report to clinic if the numbers are consistently higher  than 140/90. We discussed the importance of compliance with medical therapy and DASH diet recommended, consequences of uncontrolled hypertension discussed.  - continue current BP medications  3. Gastroesophageal reflux disease without esophagitis  Continue current medications  4. Neuropathic pain Prescribed - acyclovir cream (ZOVIRAX) 5 %; Apply 1 application topically every 4 (four) hours.  Dispense: 15 g; Refill: 3 - predniSONE (DELTASONE) 20 MG tablet; Take 1 tablet (20 mg total) by mouth daily with breakfast.  Dispense: 5 tablet; Refill: 0 - traMADol (ULTRAM) 50 MG tablet; Take 1 tablet (50 mg total) by mouth every 6 (six) hours as needed for moderate pain.  Dispense: 60 tablet; Refill: 0  Patient have been counseled extensively about nutrition and exercise. Other issues discussed during this visit include: low cholesterol diet, weight control and daily exercise, foot care, annual eye examinations at Ophthalmology, importance of adherence with medications and regular follow-up. We also discussed long term complications of uncontrolled diabetes and hypertension.   Return in about 3 months (around 02/01/2017) for Follow up Pain and comorbidities, Follow up HTN.  The patient was given clear instructions to go to ER or return to medical  center if symptoms don't improve, worsen or new problems develop. The patient verbalized understanding. The patient was told to call to get lab results if they haven't heard anything in the next week.   This note has been created with Surveyor, quantity. Any transcriptional errors are unintentional.    Angelica Chessman, MD, [place], Karilyn Cota, [place] and [place], [place]   11/04/2016, 3:32 PM

## 2016-11-04 NOTE — Patient Instructions (Signed)
Neuropathic Pain Introduction Neuropathic pain is pain caused by damage to the nerves that are responsible for certain sensations in your body (sensory nerves). The pain can be caused by damage to:  The sensory nerves that send signals to your spinal cord and brain (peripheral nervous system).  The sensory nerves in your brain or spinal cord (central nervous system). Neuropathic pain can make you more sensitive to pain. What would be a minor sensation for most people may feel very painful if you have neuropathic pain. This is usually a long-term condition that can be difficult to treat. The type of pain can differ from person to person. It may start suddenly (acute), or it may develop slowly and last for a long time (chronic). Neuropathic pain may come and go as damaged nerves heal or may stay at the same level for years. It often causes emotional distress, loss of sleep, and a lower quality of life. What are the causes? The most common cause of damage to a sensory nerve is diabetes. Many other diseases and conditions can also cause neuropathic pain. Causes of neuropathic pain can be classified as:  Toxic. Many drugs and chemicals can cause toxic damage. The most common cause of toxic neuropathic pain is damage from drug treatment for cancer (chemotherapy).  Metabolic. This type of pain can happen when a disease causes imbalances that damage nerves. Diabetes is the most common of these diseases. Vitamin B deficiency caused by long-term alcohol abuse is another common cause.  Traumatic. Any injury that cuts, crushes, or stretches a nerve can cause damage and pain. A common example is feeling pain after losing an arm or leg (phantom limb pain).  Compression-related. If a sensory nerve gets trapped or compressed for a long period of time, the blood supply to the nerve can be cut off.  Vascular. Many blood vessel diseases can cause neuropathic pain by decreasing blood supply and oxygen to  nerves.  Autoimmune. This type of pain results from diseases in which the body's defense system mistakenly attacks sensory nerves. Examples of autoimmune diseases that can cause neuropathic pain include lupus and multiple sclerosis.  Infectious. Many types of viral infections can damage sensory nerves and cause pain. Shingles infection is a common cause of this type of pain.  Inherited. Neuropathic pain can be a symptom of many diseases that are passed down through families (genetic). What are the signs or symptoms? The main symptom is pain. Neuropathic pain is often described as:  Burning.  Shock-like.  Stinging.  Hot or cold.  Itching. How is this diagnosed? No single test can diagnose neuropathic pain. Your health care provider will do a physical exam and ask you about your pain. You may use a pain scale to describe how bad your pain is. You may also have tests to see if you have a high sensitivity to pain and to help find the cause and location of any sensory nerve damage. These tests may include:  Imaging studies, such as:  X-rays.  CT scan.  MRI.  Nerve conduction studies to test how well nerve signals travel through your sensory nerves (electrodiagnostic testing).  Stimulating your sensory nerves through electrodes on your skin and measuring the response in your spinal cord and brain (somatosensory evoked potentials). How is this treated? Treatment for neuropathic pain may change over time. You may need to try different treatment options or a combination of treatments. Some options include:  Over-the-counter pain relievers.  Prescription medicines. Some medicines used to treat   other conditions may also help neuropathic pain. These include medicines to:  Control seizures (anticonvulsants).  Relieve depression (antidepressants).  Prescription-strength pain relievers (narcotics). These are usually used when other pain relievers do not help.  Transcutaneous nerve  stimulation (TENS). This uses electrical currents to block painful nerve signals. The treatment is painless.  Topical and local anesthetics. These are medicines that numb the nerves. They can be injected as a nerve block or applied to the skin.  Alternative treatments, such as:  Acupuncture.  Meditation.  Massage.  Physical therapy.  Pain management programs.  Counseling. Follow these instructions at home:  Learn as much as you can about your condition.  Take medicines only as directed by your health care provider.  Work closely with all your health care providers to find what works best for you.  Have a good support system at home.  Consider joining a chronic pain support group. Contact a health care provider if:  Your pain treatments are not helping.  You are having side effects from your medicines.  You are struggling with fatigue, mood changes, depression, or anxiety. This information is not intended to replace advice given to you by your health care provider. Make sure you discuss any questions you have with your health care provider. Document Released: 06/04/2004 Document Revised: 03/27/2016 Document Reviewed: 02/15/2014  2017 Elsevier  

## 2016-11-06 ENCOUNTER — Other Ambulatory Visit: Payer: Self-pay | Admitting: *Deleted

## 2016-11-06 MED ORDER — MOMETASONE FUROATE 50 MCG/ACT NA SUSP: 2.0000 | Freq: Every day | NASAL | 3 refills | Status: DC

## 2016-11-09 ENCOUNTER — Other Ambulatory Visit: Payer: Self-pay | Admitting: *Deleted

## 2016-11-09 DIAGNOSIS — R4182 Altered mental status, unspecified: Secondary | ICD-10-CM | POA: Insufficient documentation

## 2016-11-10 ENCOUNTER — Other Ambulatory Visit: Payer: Self-pay

## 2016-11-10 ENCOUNTER — Other Ambulatory Visit: Payer: Self-pay | Admitting: Pharmacist

## 2016-11-10 MED ORDER — MOMETASONE FUROATE 50 MCG/ACT NA SUSP: 2.0000 | Freq: Every day | NASAL | 3 refills | Status: DC

## 2016-11-10 MED ORDER — CELECOXIB 50 MG PO CAPS
50.0000 mg | ORAL_CAPSULE | Freq: Two times a day (BID) | ORAL | 0 refills | Status: DC | PRN
Start: 2016-11-10 — End: 2016-12-07

## 2016-11-13 ENCOUNTER — Telehealth: Payer: Self-pay | Admitting: Internal Medicine

## 2016-11-13 ENCOUNTER — Telehealth: Payer: Self-pay

## 2016-11-13 NOTE — Telephone Encounter (Signed)
Patient contacted the office to see if she is needing to schedule an appointment within [facility] or with [place]. Spoke with carmen and per carmen ask patient when is she getting discharged and how soon does she need to be seen. Also inform pt that carmen spoke with emily and Opal Sidles has to contact emily on Monday to schedule a cpap appointment. Pt states she was seening if she would be able to have her cpap teaching while [facility] also I informed pt that I would not be able to answer that questions but I will have to send a message to carmen asking her. Also I informed pt that she would not be able to be released from the hospital until she has an appointment. I informed pt that someone will be in contact with her regarding this.

## 2016-11-13 NOTE — Telephone Encounter (Signed)
Ms. Raquel Sarna from [facility] called to see if we could schedule an appointment for Ms. Anita James to get her CPAP machine. Patient became very ill and it is imperative she gets machine.   Please follow up with Ms. Raquel Sarna

## 2016-11-16 ENCOUNTER — Telehealth: Payer: Self-pay

## 2016-11-16 NOTE — Telephone Encounter (Signed)
Call placed to [facility], [place] at [facility] to inform her that this CM is waiting for an appointment for CPAP teaching for the patient.. A message has been left for the respiratory therapy department

## 2016-11-16 NOTE — Telephone Encounter (Signed)
Call received from [place], [place] at [facility] # [phone] requesting an appointment for CPAP teaching for the patient. Raquel Sarna stated that the patient is ready for discharge as soon as the appointment is scheduled. Informed her that an appointment will need to be set up with the hospital respiratory therapy department.  This CM to call back when an appointment has been scheduled for teaching.  Call place to [facility] at [facility] Respiratory Therapy Department. Voicemail message left requesting a call back to # [phone] or [phone].

## 2016-11-18 ENCOUNTER — Telehealth: Payer: Self-pay

## 2016-11-18 NOTE — Telephone Encounter (Signed)
Call placed to [facility] with [facility] Respiratory Therapy and scheduled an appointment for CPAP teaching on 11/26/16 @ 1000 at [facility].  Call placed to Geryl Rankins, [place] at [facility] and informed her of the scheduled appointment for CPAP teaching. Also informed her that respiratory therapy will be calling her to schedule the appointment as it needs to be confirmed with the patient.  Raquel Sarna stated that she would notify the patient and she was very appreciative of the call.

## 2016-11-18 NOTE — Telephone Encounter (Signed)
Call received from Dr Gwynneth Macleod inquiring if the patient's appointment for CPAP teaching can be moved up to Friday, 11/20/16 as they would like to discharge the patient and have her come directly to [facility] for the teaching as it is essential that she receive the CPAP at discharge.   Call placed to [facility] with [facility] Respiratory Therapy Dept and changed the CPAP teaching appointment to 11/20/16 @ 1000.   Call placed to Geryl Rankins CM at [facility] and informed her that the CPAP teaching is now scheduled for 11/20/16 @ 1000 at [facility].

## 2016-11-19 ENCOUNTER — Telehealth: Payer: Self-pay | Admitting: Internal Medicine

## 2016-11-19 NOTE — Telephone Encounter (Signed)
Patient requesting referral to a diabetic nutritionist  Pt currently in hospital and can be reached at [phone]

## 2016-11-19 NOTE — Telephone Encounter (Signed)
Will forward to pcp

## 2016-11-20 ENCOUNTER — Telehealth: Payer: Self-pay

## 2016-11-20 MED FILL — TRUEplus LANCETS 28G MISC: 33 days supply | Qty: 100 | Fill #0

## 2016-11-20 NOTE — Telephone Encounter (Signed)
Call placed to [facility] Respiratory Therapy Department. Spoke to [place] who confirmed that she will be at [facility] for CPAP teaching appointment today.   Call placed to Lum Keas at [facility] # [phone] and confirmed that the patient was discharged today. Her daughter picked her up and they provided her with directions to [facility] and reminded her that her appointment is at 1000.

## 2016-11-20 NOTE — Telephone Encounter (Signed)
The patient and her 2 daughters completed CPAP teaching with Phillis Knack, RT with [facility] Respiratory Therapy Dept.  The patient left with her mask and CPAP machine.    Call placed to [facility], [place] # [phone]  at [facility]. Informed her that the patient has completed CPAP teaching and has her machine. Dr Gwynneth Macleod informed of the same.

## 2016-11-23 ENCOUNTER — Other Ambulatory Visit: Payer: Self-pay | Admitting: Internal Medicine

## 2016-11-23 DIAGNOSIS — E1165 Type 2 diabetes mellitus with hyperglycemia: Secondary | ICD-10-CM

## 2016-11-23 DIAGNOSIS — IMO0002 Reserved for concepts with insufficient information to code with codable children: Secondary | ICD-10-CM

## 2016-11-23 DIAGNOSIS — E118 Type 2 diabetes mellitus with unspecified complications: Principal | ICD-10-CM

## 2016-11-23 DIAGNOSIS — Z794 Long term (current) use of insulin: Principal | ICD-10-CM

## 2016-11-23 MED FILL — GABAPENTIN 300 MG CAPSULE: 300 | 30 days supply | Qty: 120 | Fill #1

## 2016-11-23 MED FILL — METOPROLOL SUCC ER 200 MG T: 200 | 90 days supply | Qty: 90 | Fill #0

## 2016-11-23 MED FILL — ?LISINOPRIL 40 MG TABLET: 40 MG | 30 days supply | Qty: 30 | Fill #3

## 2016-11-23 MED FILL — ATORVASTATIN 40 MG TABLET: 40 | 30 days supply | Qty: 30 | Fill #4

## 2016-11-23 NOTE — Telephone Encounter (Signed)
I put in referral for dm education. thanks

## 2016-11-25 ENCOUNTER — Ambulatory Visit: Payer: Self-pay | Attending: Internal Medicine | Admitting: Internal Medicine

## 2016-11-25 ENCOUNTER — Other Ambulatory Visit: Payer: Self-pay

## 2016-11-25 ENCOUNTER — Encounter: Payer: Self-pay | Admitting: Internal Medicine

## 2016-11-25 VITALS — BP 108/68 | HR 73 | Temp 98.3°F | Resp 16 | Wt 279.6 lb

## 2016-11-25 DIAGNOSIS — IMO0002 Reserved for concepts with insufficient information to code with codable children: Secondary | ICD-10-CM

## 2016-11-25 DIAGNOSIS — I130 Hypertensive heart and chronic kidney disease with heart failure and stage 1 through stage 4 chronic kidney disease, or unspecified chronic kidney disease: Secondary | ICD-10-CM | POA: Insufficient documentation

## 2016-11-25 DIAGNOSIS — E1122 Type 2 diabetes mellitus with diabetic chronic kidney disease: Secondary | ICD-10-CM | POA: Insufficient documentation

## 2016-11-25 DIAGNOSIS — N189 Chronic kidney disease, unspecified: Secondary | ICD-10-CM | POA: Insufficient documentation

## 2016-11-25 DIAGNOSIS — Z79899 Other long term (current) drug therapy: Secondary | ICD-10-CM | POA: Insufficient documentation

## 2016-11-25 DIAGNOSIS — I10 Essential (primary) hypertension: Secondary | ICD-10-CM

## 2016-11-25 DIAGNOSIS — E785 Hyperlipidemia, unspecified: Secondary | ICD-10-CM

## 2016-11-25 DIAGNOSIS — E1165 Type 2 diabetes mellitus with hyperglycemia: Secondary | ICD-10-CM | POA: Insufficient documentation

## 2016-11-25 DIAGNOSIS — J189 Pneumonia, unspecified organism: Secondary | ICD-10-CM

## 2016-11-25 DIAGNOSIS — E039 Hypothyroidism, unspecified: Secondary | ICD-10-CM | POA: Insufficient documentation

## 2016-11-25 DIAGNOSIS — K219 Gastro-esophageal reflux disease without esophagitis: Secondary | ICD-10-CM | POA: Insufficient documentation

## 2016-11-25 DIAGNOSIS — I509 Heart failure, unspecified: Secondary | ICD-10-CM | POA: Insufficient documentation

## 2016-11-25 DIAGNOSIS — E1140 Type 2 diabetes mellitus with diabetic neuropathy, unspecified: Secondary | ICD-10-CM

## 2016-11-25 DIAGNOSIS — Z880 Allergy status to penicillin: Secondary | ICD-10-CM | POA: Insufficient documentation

## 2016-11-25 DIAGNOSIS — J181 Lobar pneumonia, unspecified organism: Secondary | ICD-10-CM

## 2016-11-25 DIAGNOSIS — Z794 Long term (current) use of insulin: Secondary | ICD-10-CM | POA: Insufficient documentation

## 2016-11-25 DIAGNOSIS — G4733 Obstructive sleep apnea (adult) (pediatric): Secondary | ICD-10-CM | POA: Insufficient documentation

## 2016-11-25 DIAGNOSIS — K760 Fatty (change of) liver, not elsewhere classified: Secondary | ICD-10-CM | POA: Insufficient documentation

## 2016-11-25 LAB — POCT CBG (FASTING - GLUCOSE)-MANUAL ENTRY: Glucose Fasting, POC: 214 mg/dL — AB (ref 70–99)

## 2016-11-25 LAB — POCT GLYCOSYLATED HEMOGLOBIN (HGB A1C): Hemoglobin A1C: 8

## 2016-11-25 MED ORDER — INSULIN ASPART 100 UNIT/ML FLEXPEN: 30.0000 [IU] | PEN_INJECTOR | Freq: Three times a day (TID) | SUBCUTANEOUS | 3 refills | Status: DC

## 2016-11-25 MED ORDER — FLUCONAZOLE 150 MG PO TABS: 150.0000 mg | ORAL_TABLET | Freq: Every day | ORAL | 0 refills | Status: DC

## 2016-11-25 MED ORDER — LIRAGLUTIDE 18 MG/3ML ~~LOC~~ SOPN: PEN_INJECTOR | SUBCUTANEOUS | 3 refills | Status: DC

## 2016-11-25 MED ORDER — INSULIN ASPART 100 UNIT/ML FLEXPEN: 40.0000 [IU] | PEN_INJECTOR | Freq: Three times a day (TID) | SUBCUTANEOUS | 3 refills | Status: DC

## 2016-11-25 MED ORDER — METOPROLOL SUCCINATE ER 200 MG PO TB24: 200.0000 mg | ORAL_TABLET | Freq: Every day | ORAL | 3 refills | Status: DC

## 2016-11-25 MED ORDER — INSULIN GLARGINE 100 UNIT/ML SOLOSTAR PEN: 70.0000 [IU] | PEN_INJECTOR | Freq: Every day | SUBCUTANEOUS | 5 refills | Status: DC

## 2016-11-25 MED ORDER — GABAPENTIN 300 MG PO CAPS: 600.0000 mg | ORAL_CAPSULE | Freq: Two times a day (BID) | ORAL | 3 refills | Status: DC

## 2016-11-25 MED ORDER — LISINOPRIL 40 MG PO TABS: 40.0000 mg | ORAL_TABLET | Freq: Every day | ORAL | 3 refills | Status: DC

## 2016-11-25 MED ORDER — ATORVASTATIN CALCIUM 40 MG PO TABS: 40.0000 mg | ORAL_TABLET | Freq: Every day | ORAL | 3 refills | Status: DC

## 2016-11-25 MED ORDER — DEXLANSOPRAZOLE 30 MG PO CPDR: 30.0000 mg | DELAYED_RELEASE_CAPSULE | Freq: Every day | ORAL | 3 refills | Status: DC

## 2016-11-25 MED ORDER — NYSTATIN 100000 UNIT/GM EX POWD: Freq: Four times a day (QID) | CUTANEOUS | 0 refills | Status: DC

## 2016-11-25 NOTE — Progress Notes (Signed)
Anita James, is a 66 y.o. female  MCE:[id number]  ESL:[id number]  DOB - 13-Jul-1951  No chief complaint on file.       Subjective:   Anita James is a 66 y.o. female here today for a follow up visit for htn, osa, dm.  Pt recently admitted at [facility] 11/09/16 - 11/20/16 for Acute hypercarbic hypoxic respiratory failure 2/2 OSA and opioid use, POA, resolved.  In icu several days for bipap. Pt was also treated for aspiration pna/pneumonitis [facility] as well.  On discharge, she came to our clinic to get her CPAP training and finally pick up her charity cpap machine.  Pt's hctz was stopped, and discharge w/ lasix 20bid.    Of note, pt states she is doing much better overall. Using her cpap machine from around 10pm to 7am now regularly. Feels less tired in am, but still has periods of fatigue. Eating much better as well.  She is currently on lantus 70 u qam and novolog 40tid.  Pending diabetic education, referral placed last week.  Denies tob/etoh currently.  C/o of current yeast infection under breast as well.  [name], [name], [name], [name], [name]  No problems updated.  ALLERGIES: Allergies  Allergen Reactions  . Amoxicillin-Pot Clavulanate Nausea And Vomiting and Other (See Comments)    Combination of medications tear lining of stomach  . Sulfa Antibiotics Shortness Of Breath  . Azithromycin   . Norvasc [Amlodipine Besylate] Swelling    feet    PAST MEDICAL HISTORY: Past Medical History:  Diagnosis Date  . Anxiety   . Carpal tunnel syndrome 06/14/2014  . Chest pain   . CHF (congestive heart failure) ([place])   . Chronic back pain   . Chronic kidney disease 03/2013   failure due to meds  . Diabetes mellitus type II, uncontrolled ([place])   . Diverticulitis   . Frequent headaches   . Hiatal hernia   . Hyperlipemia   . Hypertension   . Hypothyroidism   . 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  Neuropathy ([place])   . Obesity   . Pneumonia   . Vitamin D deficiency 06/05/2014    MEDICATIONS AT HOME: Prior to Admission medications   Medication Sig Start Date End Date Taking? Authorizing Provider  acyclovir ointment (ZOVIRAX) 5 % Apply 1 application topically every 4 (four) hours. 11/04/16   Tresa Garter, MD  atorvastatin (LIPITOR) 40 MG tablet Take 1 tablet (40 mg total) by mouth daily. 08/17/16   Maren Reamer, MD  Blood Glucose Monitoring Suppl (TRUE METRIX METER) w/Device KIT Use as instructed 09/08/16   Tresa Garter, MD  celecoxib (CELEBREX) 50 MG capsule Take 1 capsule (50 mg total) by mouth 2 (two) times daily as needed for pain. 11/10/16   Maren Reamer, MD  Cetirizine HCl 10 MG CAPS Take 1 capsule (10 mg total) by mouth daily. 05/28/16   Maren Reamer, MD  cyclobenzaprine (FLEXERIL) 5 MG tablet Take 1 tablet (5 mg total) by mouth 3 (three) times daily as needed for muscle spasms. 01/13/16   Arnoldo Morale, MD  Dexlansoprazole (DEXILANT) 30 MG capsule Take 1 capsule (30 mg total) by mouth daily. 10/13/16   Maren Reamer, MD  diclofenac sodium (VOLTAREN) 1 % GEL Apply 4 g topically 4 (four) times daily as needed (Pain). 07/29/16   Shawn C Joy, PA-C  gabapentin (NEURONTIN) 300 MG  capsule Take 2 capsules (600 mg total) by mouth 2 (two) times daily. 10/08/16   Maren Reamer, MD  glucose blood (TRUE METRIX BLOOD GLUCOSE TEST) test strip Check glucose twice a day 09/09/16   Maren Reamer, MD  guaiFENesin (MUCINEX) 600 MG 12 hr tablet Take 1 tablet (600 mg total) by mouth 2 (two) times daily as needed. 05/28/16   Maren Reamer, MD  insulin aspart (NOVOLOG) 100 UNIT/ML FlexPen Inject 35 Units into the skin 3 (three) times daily with meals. 10/13/16   Maren Reamer, MD  Insulin Glargine (LANTUS SOLOSTAR) 100 UNIT/ML Solostar Pen Inject 70 Units into the skin daily at 10 pm. 10/13/16   Maren Reamer, MD  levothyroxine (SYNTHROID, LEVOTHROID) 100 MCG tablet Take 1  tablet (100 mcg total) by mouth daily before breakfast. 10/13/16   Maren Reamer, MD  liraglutide 18 MG/3ML SOPN 0.6 mg subcutaneously daily for1 week, then 1.2 mg daily for 1 week, then 1.8 mg daily subsequently. 08/17/16   Maren Reamer, MD  lisinopril (PRINIVIL,ZESTRIL) 40 MG tablet Take 1 tablet (40 mg total) by mouth daily. 08/17/16   Maren Reamer, MD  loratadine (CLARITIN) 10 MG tablet Take 1 tablet (10 mg total) by mouth daily. 04/06/16   Maren Reamer, MD  metoprolol (TOPROL-XL) 200 MG 24 hr tablet Take 1 tablet (200 mg total) by mouth daily. 08/17/16   Maren Reamer, MD  mometasone (NASONEX) 50 MCG/ACT nasal spray Place 2 sprays into the nose daily. 11/10/16   Maren Reamer, MD  Multiple Vitamins-Minerals (EYE VITAMINS) CAPS Take 1 capsule by mouth daily. 08/11/12   Thurnell Lose, MD  predniSONE (DELTASONE) 20 MG tablet Take 1 tablet (20 mg total) by mouth daily with breakfast. 11/04/16   Tresa Garter, MD  traMADol (ULTRAM) 50 MG tablet Take 1 tablet (50 mg total) by mouth every 6 (six) hours as needed for moderate pain. 11/04/16   Tresa Garter, MD     Objective:   Vitals:   11/25/16 1049  BP: 108/68  Pulse: 73  Resp: 16  Temp: 98.3 F (36.8 C)  TempSrc: Oral  SpO2: 98%  Weight: 279 lb 9.6 oz (126.8 kg)    Exam General appearance : Awake, alert, not in any distress. Speech Clear. Not toxic looking, pleasant. HEENT: Atraumatic and Normocephalic, pupils equally reactive to light. Neck: supple, no JVD. No cervical lymphadenopathy.  Chest:Good air entry bilaterally, no added sounds. CVS: S1 S2 regular, no murmurs/gallups or rubs. Abdomen: Bowel sounds active, obese, Non tender and not distended with no gaurding, rigidity or rebound. Extremities: B/L Lower Ext shows no edema, both legs are warm to touch Neurology: Awake alert, and oriented X 3, CN II-XII grossly intact, Non focal Skin:No Rash  Data Review Lab Results  Component Value Date    HGBA1C 8.8 09/08/2016   HGBA1C 9.4 08/17/2016   HGBA1C 7.5 02/28/2016    Depression screen PHQ 2/9 11/25/2016 11/04/2016 04/06/2016 02/28/2016 01/13/2016  Decreased Interest '3 3 1 3 3  ' Down, Depressed, Hopeless '3 3 1 3 3  ' PHQ - 2 Score '6 6 2 6 6  ' Altered sleeping '3 3 3 3 3  ' Tired, decreased energy '3 3 1 3 3  ' Change in appetite '1 3 1 3 2  ' Feeling bad or failure about yourself  3 0 '3 3 3  ' Trouble concentrating '3 3 1 3 1  ' Moving slowly or fidgety/restless 1 2 0 3 1  Suicidal  thoughts 2 3 0 3 0  PHQ-9 Score '22 23 11 27 19  ' Difficult doing work/chores - - - Extremely dIfficult -  Some recent data might be hidden    11/09/16 [facility] cxr XR CHEST AP PORTABLE  Final Result   Acute left lower lung opacity which may represent aspiration or pneumonia.   11/09/16 [facility] US ABDOMEN LIMITED  Final Result   1. Evidence of hepatic steatosis.  2. Suggestion of irregularity of the liver contour.  3. No findings to suggest acute cholecystitis.     Assessment & Plan   1. Type 2 diabetes, uncontrolled, with neuropathy (HCC) - continue current lantus and novolog regimen - ref placed for diabetic education, pending as well - continue low carb meals and cbg checks - POCT CBG (Fasting - Glucose) - POCT glycosylated hemoglobin (Hb A1C) 8.0 - currently on lantus 70 qd and novolog 40tid, renewed' - pt does novolog sss at night prior to bedtime as well. - continue neurontin 600bid - renewed liraglutide 55mg qd - tou our pharm.  2. Hx of recent hospitalization for acute hypoxic resp failure, 2nd to untreated osa and opiod - treated w/ bipap in hospital - treated for asp pna/pneumonitis as well - now has cpap, and using - encouraged continue use - avoid sedative hypnotics/narcotics.  - will dc ultram in our system - repeat cxr in 1 wk to ensure clearance of lll pna - ordered  3. Htn, well controlled - not currently on hctz - on lasix 20bid - asked to stop for now. - pending bmp -  continue toprol 200qd, lisinopril 40qd - continue low salt diet  4. Hypothyroid - on synthroid, will rechk levels today prior to renewing.  5. Hepatic steatosis - suspect from dm/obesity, weightloss recd  6. gerd  -dexilant     Patient have been counseled extensively about nutrition and exercise  Return in about 6 weeks (around 01/06/2017).  The patient was given clear instructions to go to ER or return to medical center if symptoms don't improve, worsen or new problems develop. The patient verbalized understanding. The patient was told to call to get lab results if they haven't heard anything in the next week.   This note has been created with D[employer]veyor, quantity Any transcriptional errors are unintentional.   DMaren Reamer MD, MInniswold[facility] W[facility] HospitalGMelia [place]  11/25/2016, 10:49 AM

## 2016-11-25 NOTE — Patient Instructions (Signed)
Sleep Apnea Sleep apnea is a condition that affects breathing. People with sleep apnea have moments during sleep when their breathing pauses briefly or gets shallow. Sleep apnea can cause these symptoms:  Trouble staying asleep.  Sleepiness or tiredness during the day.  Irritability.  Loud snoring.  Morning headaches.  Trouble concentrating.  Forgetting things.  Less interest in sex.  Being sleepy for no reason.  Mood swings.  Personality changes.  Depression.  Waking up a lot during the night to pee (urinate).  Dry mouth.  Sore throat. Follow these instructions at home:  Make any changes in your routine that your doctor recommends.  Eat a healthy, well-balanced diet.  Take over-the-counter and prescription medicines only as told by your doctor.  Avoid using alcohol, calming medicines (sedatives), and narcotic medicines.  Take steps to lose weight if you are overweight.  If you were given a machine (device) to use while you sleep, use it only as told by your doctor.  Do not use any tobacco products, such as cigarettes, chewing tobacco, and e-cigarettes. If you need help quitting, ask your doctor.  Keep all follow-up visits as told by your doctor. This is important. Contact a doctor if:  The machine that you were given to use during sleep is uncomfortable or does not seem to be working.  Your symptoms do not get better.  Your symptoms get worse. Get help right away if:  Your chest hurts.  You have trouble breathing in enough air (shortness of breath).  You have an uncomfortable feeling in your back, arms, or stomach.  You have trouble talking.  One side of your body feels weak.  A part of your face is hanging down (drooping). These symptoms may be an emergency. Do not wait to see if the symptoms will go away. Get medical help right away. Call your local emergency services (911 in the [place] Do not drive yourself to the hospital. This information is  not intended to replace advice given to you by your health care provider. Make sure you discuss any questions you have with your health care provider. Document Released: 06/16/2008 Document Revised: 05/03/2016 Document Reviewed: 06/17/2015 Elsevier Interactive Patient Education  2017 Else[employer] Inc.   -   Low-Sodium Eating Plan Sodium, which is an element that makes up salt, helps you maintain a healthy balance of fluids in your body. Too much sodium can increase your blood pressure and cause fluid and waste to be held in your body. Your health care provider or dietitian may recommend following this plan if you have high blood pressure (hypertension), kidney disease, liver disease, or heart failure. Eating less sodium can help lower your blood pressure, reduce swelling, and protect your heart, liver, and kidneys. What are tips for following this plan? General guidelines   Most people on this plan should limit their sodium intake to 1,500-2,000 mg (milligrams) of sodium each day. Reading food labels   The Nutrition Facts label lists the amount of sodium in one serving of the food. If you eat more than one serving, you must multiply the listed amount of sodium by the number of servings.  Choose foods with less than 140 mg of sodium per serving.  Avoid foods with 300 mg of sodium or more per serving. Shopping   Look for lower-sodium products, often labeled as "low-sodium" or "no salt added."  Always check the sodium content even if foods are labeled as "unsalted" or "no salt added".  Buy fresh foods.  Avoid  canned foods and premade or frozen meals.  Avoid canned, cured, or processed meats  Buy breads that have less than 80 mg of sodium per slice. Cooking   Eat more home-cooked food and less restaurant, buffet, and fast food.  Avoid adding salt when cooking. Use salt-free seasonings or herbs instead of table salt or sea salt. Check with your health care provider or pharmacist before  using salt substitutes.  Cook with plant-based oils, such as canola, sunflower, or olive oil. Meal planning   When eating at a restaurant, ask that your food be prepared with less salt or no salt, if possible.  Avoid foods that contain MSG (monosodium glutamate). MSG is sometimes added to [place] food, bouillon, and some canned foods. What foods are recommended? The items listed may not be a complete list. Talk with your dietitian about what dietary choices are best for you. Grains  Low-sodium cereals, including oats, puffed wheat and rice, and shredded wheat. Low-sodium crackers. Unsalted rice. Unsalted pasta. Low-sodium bread. Whole-grain breads and whole-grain pasta. Vegetables  Fresh or frozen vegetables. "No salt added" canned vegetables. "No salt added" tomato sauce and paste. Low-sodium or reduced-sodium tomato and vegetable juice. Fruits  Fresh, frozen, or canned fruit. Fruit juice. Meats and other protein foods  Fresh or frozen (no salt added) meat, poultry, seafood, and fish. Low-sodium canned tuna and salmon. Unsalted nuts. Dried peas, beans, and lentils without added salt. Unsalted canned beans. Eggs. Unsalted nut butters. Dairy  Milk. Soy milk. Cheese that is naturally low in sodium, such as ricotta cheese, fresh mozzarella, or Swiss cheese Low-sodium or reduced-sodium cheese. Cream cheese. Yogurt. Fats and oils  Unsalted butter. Unsalted margarine with no trans fat. Vegetable oils such as canola or olive oils. Seasonings and other foods  Fresh and dried herbs and spices. Salt-free seasonings. Low-sodium mustard and ketchup. Sodium-free salad dressing. Sodium-free light mayonnaise. Fresh or refrigerated horseradish. Lemon juice. Vinegar. Homemade, reduced-sodium, or low-sodium soups. Unsalted popcorn and pretzels. Low-salt or salt-free chips. What foods are not recommended? The items listed may not be a complete list. Talk with your dietitian about what dietary choices are best  for you. Grains  Instant hot cereals. Bread stuffing, pancake, and biscuit mixes. Croutons. Seasoned rice or pasta mixes. Noodle soup cups. Boxed or frozen macaroni and cheese. Regular salted crackers. Self-rising flour. Vegetables  Sauerkraut, pickled vegetables, and relishes. Olives. [place] fries. Onion rings. Regular canned vegetables (not low-sodium or reduced-sodium). Regular canned tomato sauce and paste (not low-sodium or reduced-sodium). Regular tomato and vegetable juice (not low-sodium or reduced-sodium). Frozen vegetables in sauces. Meats and other protein foods  Meat or fish that is salted, canned, smoked, spiced, or pickled. Bacon, ham, sausage, hotdogs, corned beef, chipped beef, packaged lunch meats, salt pork, jerky, pickled herring, anchovies, regular canned tuna, sardines, salted nuts. Dairy  Processed cheese and cheese spreads. Cheese curds. Blue cheese. Feta cheese. String cheese. Regular cottage cheese. Buttermilk. Canned milk. Fats and oils  Salted butter. Regular margarine. Ghee. Bacon fat. Seasonings and other foods  Onion salt, garlic salt, seasoned salt, table salt, and sea salt. Canned and packaged gravies. Worcestershire sauce. Tartar sauce. Barbecue sauce. Teriyaki sauce. Soy sauce, including reduced-sodium. Steak sauce. Fish sauce. Oyster sauce. Cocktail sauce. Horseradish that you find on the shelf. Regular ketchup and mustard. Meat flavorings and tenderizers. Bouillon cubes. Hot sauce and Tabasco sauce. Premade or packaged marinades. Premade or packaged taco seasonings. Relishes. Regular salad dressings. Salsa. Potato and tortilla chips. Corn chips and puffs. Salted popcorn  and pretzels. Canned or dried soups. Pizza. Frozen entrees and pot pies. Summary  Eating less sodium can help lower your blood pressure, reduce swelling, and protect your heart, liver, and kidneys.  Most people on this plan should limit their sodium intake to 1,500-2,000 mg (milligrams) of sodium  each day.  Canned, boxed, and frozen foods are high in sodium. Restaurant foods, fast foods, and pizza are also very high in sodium. You also get sodium by adding salt to food.  Try to cook at home, eat more fresh fruits and vegetables, and eat less fast food, canned, processed, or prepared foods. This information is not intended to replace advice given to you by your health care provider. Make sure you discuss any questions you have with your health care provider. Document Released: 02/27/2002 Document Revised: 08/31/2016 Document Reviewed: 08/31/2016 Elsevier Interactive Patient Education  2017 Elsevier Inc.  -   Diabetes Mellitus and Exercise Exercising regularly is important for your overall health, especially when you have diabetes (diabetes mellitus). Exercising is not only about losing weight. It has many health benefits, such as increasing muscle strength and bone density and reducing body fat and stress. This leads to improved fitness, flexibility, and endurance, all of which result in better overall health. Exercise has additional benefits for people with diabetes, including:  Reducing appetite.  Helping to lower and control blood glucose.  Lowering blood pressure.  Helping to control amounts of fatty substances (lipids) in the blood, such as cholesterol and triglycerides.  Helping the body to respond better to insulin (improving insulin sensitivity).  Reducing how much insulin the body needs.  Decreasing the risk for heart disease by:  Lowering cholesterol and triglyceride levels.  Increasing the levels of good cholesterol.  Lowering blood glucose levels. What is my activity plan? Your health care provider or certified diabetes educator can help you make a plan for the type and frequency of exercise (activity plan) that works for you. Make sure that you:  Do at least 150 minutes of moderate-intensity or vigorous-intensity exercise each week. This could be brisk  walking, biking, or water aerobics.  Do stretching and strength exercises, such as yoga or weightlifting, at least 2 times a week.  Spread out your activity over at least 3 days of the week.  Get some form of physical activity every day.  Do not go more than 2 days in a row without some kind of physical activity.  Avoid being inactive for more than 90 minutes at a time. Take frequent breaks to walk or stretch.  Choose a type of exercise or activity that you enjoy, and set realistic goals.  Start slowly, and gradually increase the intensity of your exercise over time. What do I need to know about managing my diabetes?  Check your blood glucose before and after exercising.  If your blood glucose is higher than 240 mg/dL (13.3 mmol/L) before you exercise, check your urine for ketones. If you have ketones in your urine, do not exercise until your blood glucose returns to normal.  Know the symptoms of low blood glucose (hypoglycemia) and how to treat it. Your risk for hypoglycemia increases during and after exercise. Common symptoms of hypoglycemia can include:  Hunger.  Anxiety.  Sweating and feeling clammy.  Confusion.  Dizziness or feeling light-headed.  Increased heart rate or palpitations.  Blurry vision.  Tingling or numbness around the mouth, lips, or tongue.  Tremors or shakes.  Irritability.  Keep a rapid-acting carbohydrate snack available before, during,  and after exercise to help prevent or treat hypoglycemia.  Avoid injecting insulin into areas of the body that are going to be exercised. For example, avoid injecting insulin into:  The arms, when playing tennis.  The legs, when jogging.  Keep records of your exercise habits. Doing this can help you and your health care provider adjust your diabetes management plan as needed. Write down:  Food that you eat before and after you exercise.  Blood glucose levels before and after you exercise.  The type and  amount of exercise you have done.  When your insulin is expected to peak, if you use insulin. Avoid exercising at times when your insulin is peaking.  When you start a new exercise or activity, work with your health care provider to make sure the activity is safe for you, and to adjust your insulin, medicines, or food intake as needed.  Drink plenty of water while you exercise to prevent dehydration or heat stroke. Drink enough fluid to keep your urine clear or pale yellow. This information is not intended to replace advice given to you by your health care provider. Make sure you discuss any questions you have with your health care provider. Document Released: 11/28/2003 Document Revised: 03/27/2016 Document Reviewed: 02/17/2016 Elsevier Interactive Patient Education  2017 Else[employer] Inc.  - Diabetes Mellitus and Food It is important for you to manage your blood sugar (glucose) level. Your blood glucose level can be greatly affected by what you eat. Eating healthier foods in the appropriate amounts throughout the day at about the same time each day will help you control your blood glucose level. It can also help slow or prevent worsening of your diabetes mellitus. Healthy eating may even help you improve the level of your blood pressure and reach or maintain a healthy weight. General recommendations for healthful eating and cooking habits include:  Eating meals and snacks regularly. Avoid going long periods of time without eating to lose weight.  Eating a diet that consists mainly of plant-based foods, such as fruits, vegetables, nuts, legumes, and whole grains.  Using low-heat cooking methods, such as baking, instead of high-heat cooking methods, such as deep frying. Work with your dietitian to make sure you understand how to use the Nutrition Facts information on food labels. How can food affect me? Carbohydrates  Carbohydrates affect your blood glucose level more than any other type of  food. Your dietitian will help you determine how many carbohydrates to eat at each meal and teach you how to count carbohydrates. Counting carbohydrates is important to keep your blood glucose at a healthy level, especially if you are using insulin or taking certain medicines for diabetes mellitus. Alcohol  Alcohol can cause sudden decreases in blood glucose (hypoglycemia), especially if you use insulin or take certain medicines for diabetes mellitus. Hypoglycemia can be a life-threatening condition. Symptoms of hypoglycemia (sleepiness, dizziness, and disorientation) are similar to symptoms of having too much alcohol. If your health care provider has given you approval to drink alcohol, do so in moderation and use the following guidelines:  Women should not have more than one drink per day, and men should not have more than two drinks per day. One drink is equal to:  12 oz of beer.  5 oz of wine.  1 oz of hard liquor.  Do not drink on an empty stomach.  Keep yourself hydrated. Have water, diet soda, or unsweetened iced tea.  Regular soda, juice, and other mixers might contain a lot  of carbohydrates and should be counted. What foods are not recommended? As you make food choices, it is important to remember that all foods are not the same. Some foods have fewer nutrients per serving than other foods, even though they might have the same number of calories or carbohydrates. It is difficult to get your body what it needs when you eat foods with fewer nutrients. Examples of foods that you should avoid that are high in calories and carbohydrates but low in nutrients include:  Trans fats (most processed foods list trans fats on the Nutrition Facts label).  Regular soda.  Juice.  Candy.  Sweets, such as cake, pie, doughnuts, and cookies.  Fried foods. What foods can I eat? Eat nutrient-rich foods, which will nourish your body and keep you healthy. The food you should eat also will depend  on several factors, including:  The calories you need.  The medicines you take.  Your weight.  Your blood glucose level.  Your blood pressure level.  Your cholesterol level. You should eat a variety of foods, including:  Protein.  Lean cuts of meat.  Proteins low in saturated fats, such as fish, egg whites, and beans. Avoid processed meats.  Fruits and vegetables.  Fruits and vegetables that may help control blood glucose levels, such as apples, mangoes, and yams.  Dairy products.  Choose fat-free or low-fat dairy products, such as milk, yogurt, and cheese.  Grains, bread, pasta, and rice.  Choose whole grain products, such as multigrain bread, whole oats, and brown rice. These foods may help control blood pressure.  Fats.  Foods containing healthful fats, such as nuts, avocado, olive oil, canola oil, and fish. Does everyone with diabetes mellitus have the same meal plan? Because every person with diabetes mellitus is different, there is not one meal plan that works for everyone. It is very important that you meet with a dietitian who will help you create a meal plan that is just right for you. This information is not intended to replace advice given to you by your health care provider. Make sure you discuss any questions you have with your health care provider. Document Released: 06/04/2005 Document Revised: 02/13/2016 Document Reviewed: 08/04/2013 Elsevier Interactive Patient Education  2017 [employer].

## 2016-11-26 ENCOUNTER — Other Ambulatory Visit: Payer: Self-pay | Admitting: Pharmacist

## 2016-11-26 DIAGNOSIS — IMO0002 Reserved for concepts with insufficient information to code with codable children: Secondary | ICD-10-CM

## 2016-11-26 DIAGNOSIS — E1165 Type 2 diabetes mellitus with hyperglycemia: Principal | ICD-10-CM

## 2016-11-26 DIAGNOSIS — E1140 Type 2 diabetes mellitus with diabetic neuropathy, unspecified: Secondary | ICD-10-CM

## 2016-11-26 LAB — BASIC METABOLIC PANEL WITH GFR
BUN: 21 mg/dL (ref 7–25)
CALCIUM: 9.2 mg/dL (ref 8.6–10.4)
CHLORIDE: 105 mmol/L (ref 98–110)
CO2: 27 mmol/L (ref 20–31)
Creat: 0.97 mg/dL (ref 0.50–0.99)
GFR, EST NON AFRICAN AMERICAN: 62 mL/min (ref >=60)
GFR, Est African American: 72 mL/min (ref >=60)
GLUCOSE: 191 mg/dL — AB (ref 65–99)
POTASSIUM: 5 mmol/L (ref 3.5–5.3)
SODIUM: 141 mmol/L (ref 135–146)

## 2016-11-26 LAB — LIPID PANEL
CHOL/HDL RATIO: 3.1 ratio (ref <5.0)
Cholesterol: 110 mg/dL (ref <200)
HDL: 36 mg/dL — AB (ref >50)
LDL Cholesterol: 40 mg/dL (ref <100)
TRIGLYCERIDES: 169 mg/dL — AB (ref <150)
VLDL: 34 mg/dL — AB (ref <30)

## 2016-11-26 LAB — BRAIN NATRIURETIC PEPTIDE: Brain Natriuretic Peptide: 10.1 pg/mL (ref <100)

## 2016-11-26 LAB — TSH: TSH: 0.32 mIU/L — ABNORMAL LOW

## 2016-11-26 MED ORDER — INSULIN ASPART 100 UNIT/ML FLEXPEN: 40.0000 [IU] | PEN_INJECTOR | Freq: Three times a day (TID) | SUBCUTANEOUS | 3 refills | Status: DC

## 2016-11-26 MED ORDER — INSULIN GLARGINE 100 UNIT/ML SOLOSTAR PEN
70.0000 [IU] | PEN_INJECTOR | Freq: Every day | SUBCUTANEOUS | 5 refills | Status: DC
Start: 2016-11-26 — End: 2016-12-07

## 2016-11-28 ENCOUNTER — Other Ambulatory Visit: Payer: Self-pay | Admitting: Internal Medicine

## 2016-11-28 MED ORDER — LEVOTHYROXINE SODIUM 88 MCG PO TABS: 88.0000 ug | ORAL_TABLET | Freq: Every day | ORAL | 3 refills | Status: DC

## 2016-12-02 ENCOUNTER — Other Ambulatory Visit: Payer: Self-pay

## 2016-12-07 ENCOUNTER — Other Ambulatory Visit: Payer: Self-pay

## 2016-12-07 DIAGNOSIS — K219 Gastro-esophageal reflux disease without esophagitis: Secondary | ICD-10-CM

## 2016-12-07 DIAGNOSIS — E1140 Type 2 diabetes mellitus with diabetic neuropathy, unspecified: Secondary | ICD-10-CM

## 2016-12-07 DIAGNOSIS — E1165 Type 2 diabetes mellitus with hyperglycemia: Secondary | ICD-10-CM

## 2016-12-07 DIAGNOSIS — IMO0002 Reserved for concepts with insufficient information to code with codable children: Secondary | ICD-10-CM

## 2016-12-07 MED ORDER — LIRAGLUTIDE 18 MG/3ML ~~LOC~~ SOPN: PEN_INJECTOR | SUBCUTANEOUS | 3 refills | Status: DC

## 2016-12-07 MED ORDER — CELECOXIB 50 MG PO CAPS: 50.0000 mg | ORAL_CAPSULE | Freq: Two times a day (BID) | ORAL | 0 refills | Status: DC | PRN

## 2016-12-07 MED ORDER — INSULIN GLARGINE 100 UNIT/ML SOLOSTAR PEN: 70.0000 [IU] | PEN_INJECTOR | Freq: Every day | SUBCUTANEOUS | 5 refills | Status: DC

## 2016-12-07 MED ORDER — INSULIN ASPART 100 UNIT/ML FLEXPEN: 40.0000 [IU] | PEN_INJECTOR | Freq: Three times a day (TID) | SUBCUTANEOUS | 3 refills | Status: DC

## 2016-12-07 MED ORDER — LEVOTHYROXINE SODIUM 88 MCG PO TABS: 88.0000 ug | ORAL_TABLET | Freq: Every day | ORAL | 3 refills | Status: DC

## 2016-12-07 MED ORDER — DEXLANSOPRAZOLE 30 MG PO CPDR: 30.0000 mg | DELAYED_RELEASE_CAPSULE | Freq: Every day | ORAL | 3 refills | Status: DC

## 2016-12-15 ENCOUNTER — Telehealth: Payer: Self-pay | Admitting: Internal Medicine

## 2016-12-15 NOTE — Telephone Encounter (Signed)
Pt called regarding a letter from Dr Janne Napoleon allowing her to return to work. Also inquired about lab results. Per Elinor Parkinson results were given. Pt understood

## 2016-12-16 ENCOUNTER — Other Ambulatory Visit: Payer: Self-pay

## 2016-12-16 ENCOUNTER — Encounter: Payer: Self-pay | Admitting: Registered"

## 2016-12-16 ENCOUNTER — Encounter: Payer: Self-pay | Attending: Internal Medicine | Admitting: Registered"

## 2016-12-16 DIAGNOSIS — E1165 Type 2 diabetes mellitus with hyperglycemia: Secondary | ICD-10-CM | POA: Insufficient documentation

## 2016-12-16 DIAGNOSIS — Z713 Dietary counseling and surveillance: Secondary | ICD-10-CM | POA: Insufficient documentation

## 2016-12-16 DIAGNOSIS — IMO0002 Reserved for concepts with insufficient information to code with codable children: Secondary | ICD-10-CM

## 2016-12-16 DIAGNOSIS — Z794 Long term (current) use of insulin: Secondary | ICD-10-CM | POA: Insufficient documentation

## 2016-12-16 DIAGNOSIS — E1140 Type 2 diabetes mellitus with diabetic neuropathy, unspecified: Secondary | ICD-10-CM

## 2016-12-16 DIAGNOSIS — E118 Type 2 diabetes mellitus with unspecified complications: Secondary | ICD-10-CM | POA: Insufficient documentation

## 2016-12-16 NOTE — Patient Instructions (Addendum)
Continue working on getting comfortable with cpap to get some good sleep which can help control blood sugar. Stress management can help with controlling blood sugar, consider looking into counseling services. Continue having a good routine of structured meals Continue including veggies with meals and snacks Continue exploring new vegetables Cheerios is an option for breakfast (with a protein) Keep looking for ways to prepare fish try at restaurants

## 2016-12-16 NOTE — Progress Notes (Signed)
Diabetes Self-Management Education  Visit Type: First/Initial  Appt. Start Time: 1540    Appt. End Time: 1700  12/16/2016  Anita James, identified by name and date of birth, is a 66 y.o. female with a diagnosis of Diabetes: Type 2.   ASSESSMENT  Patient states since discharge has been trying duplicate the meals she had during hospital stay (consistent carbs). Pt reports she was not previously eating regular meals and at first she felt overwhelmed by having a full plate of food in front of her 3 times a day. Patient states she has been very tired last few months and believes she has gained about 30 lbs, but her clothes still fit. Reason she feels she has gained weight is because she has SOB and had reported these symptoms to her doctor.  Pt states she has been afraid to leave the house since her hospital stay because she doesn't understand what happened to her and why she was in ICU for 5 days. RDN provided counselor resource list.  Height 5\' 6"  (1.676 m), weight 275 lb 8 oz (125 kg). Body mass index is 44.47 kg/m.      Diabetes Self-Management Education - 12/16/16 1550      Visit Information   Visit Type First/Initial     Initial Visit   Diabetes Type Type 2   Are you currently following a meal plan? Yes   What type of meal plan do you follow? low carb - trying to follow what they were doing in hospital, 30 g carbs,    Are you taking your medications as prescribed? Yes     Health Coping   How would you rate your overall health? Fair     Psychosocial Assessment   Patient Belief/Attitude about Diabetes Defeat/Burnout   Self-care barriers Low literacy   How often do you need to have someone help you when you read instructions, pamphlets, or other written materials from your doctor or pharmacy? 4 - Often   What is the last grade level you completed in school? 4 year college     Complications   Last HgB A1C per patient/outside source 8.1 %  per pt   How often do you check  your blood sugar? 3-4 times/day   Fasting Blood glucose range (mg/dL) 70-129   Postprandial Blood glucose range (mg/dL) 130-179  most likely 3-4 hrs postprandial   Have you had a dilated eye exam in the past 12 months? No   Have you had a dental exam in the past 12 months? No   Are you checking your feet? Yes   How many days per week are you checking your feet? 7     Dietary Intake   Breakfast cereal occassionally, fruit   Snack (morning) none OR cheese or PB and cracker   Lunch grilled chicken, broccoli, fruit (2 or 3 strawberries), cheese, veggies   Snack (afternoon) none    Dinner grilled chicken, broccoli, corn, pintos, (a few strawberries)   Snack (evening) was but doctor told her to stop   Beverage(s) water, 3-4 days diet soda OR tea     Exercise   Exercise Type Light (walking / raking leaves)   How many days per week to you exercise? 4   How many minutes per day do you exercise? 20   Total minutes per week of exercise 80     Patient Education   Previous Diabetes Education Yes (please comment)  [facility] after discharge from hospital 2 wks ago  Disease state  Definition of diabetes, type 1 and 2, and the diagnosis of diabetes   Nutrition management  Role of diet in the treatment of diabetes and the relationship between the three main macronutrients and blood glucose level;Food label reading, portion sizes and measuring food.;Carbohydrate counting     Individualized Goals (developed by patient)   Nutrition Follow meal plan discussed     Outcomes   Expected Outcomes Demonstrated interest in learning. Expect positive outcomes   Future DMSE 4-6 wks   Program Status Completed      Individualized Plan for Diabetes Self-Management Training:   Learning Objective:  Patient will have a greater understanding of diabetes self-management. Patient education plan is to attend individual and/or group sessions per assessed needs and concerns.   Patient Instructions  Continue  working on getting comfortable with cpap to get some good sleep which can help control blood sugar. Stress management can help with controlling blood sugar, consider looking into counseling services. Continue having a good routine of structured meals Continue including veggies with meals and snacks Continue exploring new vegetables Cheerios is an option for breakfast (with a protein) Keep looking for ways to prepare fish try at restaurants   Expected Outcomes:  Demonstrated interest in learning. Expect positive outcomes  Education material provided: A1C conversion sheet, My Plate, Snack sheet and Carbohydrate counting sheet  If problems or questions, patient to contact team via:  Phone and Email  Future DSME appointment: 4-6 wks

## 2016-12-21 ENCOUNTER — Other Ambulatory Visit (HOSPITAL_COMMUNITY): Payer: Self-pay | Admitting: *Deleted

## 2016-12-21 DIAGNOSIS — N644 Mastodynia: Secondary | ICD-10-CM

## 2016-12-21 MED FILL — FLUCONAZOLE 150 MG TABLET: 150 | 1 days supply | Qty: 1 | Fill #0

## 2016-12-21 MED FILL — NYSTOP 100,000 UNITS/GM PWD: 100000 | 30 days supply | Qty: 15 | Fill #0

## 2016-12-28 ENCOUNTER — Ambulatory Visit: Payer: Self-pay | Attending: Internal Medicine | Admitting: Internal Medicine

## 2016-12-28 ENCOUNTER — Encounter: Payer: Self-pay | Admitting: Internal Medicine

## 2016-12-28 ENCOUNTER — Telehealth: Payer: Self-pay | Admitting: Internal Medicine

## 2016-12-28 VITALS — BP 129/79 | HR 94 | Temp 98.3°F | Resp 16 | Wt 274.0 lb

## 2016-12-28 DIAGNOSIS — Z9989 Dependence on other enabling machines and devices: Secondary | ICD-10-CM

## 2016-12-28 DIAGNOSIS — E039 Hypothyroidism, unspecified: Secondary | ICD-10-CM

## 2016-12-28 DIAGNOSIS — Z880 Allergy status to penicillin: Secondary | ICD-10-CM | POA: Insufficient documentation

## 2016-12-28 DIAGNOSIS — E1165 Type 2 diabetes mellitus with hyperglycemia: Secondary | ICD-10-CM

## 2016-12-28 DIAGNOSIS — G4733 Obstructive sleep apnea (adult) (pediatric): Secondary | ICD-10-CM

## 2016-12-28 DIAGNOSIS — Z79899 Other long term (current) drug therapy: Secondary | ICD-10-CM | POA: Insufficient documentation

## 2016-12-28 DIAGNOSIS — I10 Essential (primary) hypertension: Secondary | ICD-10-CM

## 2016-12-28 DIAGNOSIS — E1140 Type 2 diabetes mellitus with diabetic neuropathy, unspecified: Secondary | ICD-10-CM

## 2016-12-28 DIAGNOSIS — Z794 Long term (current) use of insulin: Secondary | ICD-10-CM | POA: Insufficient documentation

## 2016-12-28 DIAGNOSIS — E1122 Type 2 diabetes mellitus with diabetic chronic kidney disease: Secondary | ICD-10-CM | POA: Insufficient documentation

## 2016-12-28 DIAGNOSIS — K449 Diaphragmatic hernia without obstruction or gangrene: Secondary | ICD-10-CM | POA: Insufficient documentation

## 2016-12-28 DIAGNOSIS — N189 Chronic kidney disease, unspecified: Secondary | ICD-10-CM | POA: Insufficient documentation

## 2016-12-28 DIAGNOSIS — R0602 Shortness of breath: Secondary | ICD-10-CM

## 2016-12-28 DIAGNOSIS — I130 Hypertensive heart and chronic kidney disease with heart failure and stage 1 through stage 4 chronic kidney disease, or unspecified chronic kidney disease: Secondary | ICD-10-CM | POA: Insufficient documentation

## 2016-12-28 DIAGNOSIS — IMO0002 Reserved for concepts with insufficient information to code with codable children: Secondary | ICD-10-CM

## 2016-12-28 DIAGNOSIS — K219 Gastro-esophageal reflux disease without esophagitis: Secondary | ICD-10-CM

## 2016-12-28 DIAGNOSIS — I5032 Chronic diastolic (congestive) heart failure: Secondary | ICD-10-CM | POA: Insufficient documentation

## 2016-12-28 LAB — POCT GLYCOSYLATED HEMOGLOBIN (HGB A1C): Hemoglobin A1C: 6

## 2016-12-28 LAB — GLUCOSE, POCT (MANUAL RESULT ENTRY): POC GLUCOSE: 99 mg/dL (ref 70–99)

## 2016-12-28 MED ORDER — RANITIDINE HCL 150 MG PO TABS: 150.0000 mg | ORAL_TABLET | Freq: Two times a day (BID) | ORAL | 0 refills | Status: DC

## 2016-12-28 MED ORDER — FAMOTIDINE 20 MG PO TABS: 20.0000 mg | ORAL_TABLET | Freq: Two times a day (BID) | ORAL | 1 refills | Status: DC

## 2016-12-28 MED ORDER — FUROSEMIDE 20 MG PO TABS: 20.0000 mg | ORAL_TABLET | Freq: Every day | ORAL | 1 refills | Status: DC

## 2016-12-28 MED FILL — raNITIdine HCL 150 MG TABS: 150 | 30 days supply | Qty: 60 | Fill #0

## 2016-12-28 MED FILL — ?FUROSEMIDE 20 MG TABLET: 20 | 30 days supply | Qty: 30 | Fill #0

## 2016-12-28 MED FILL — ?FAMOTIDINE 20 MG TABLET: 20 | 30 days supply | Qty: 60 | Fill #0

## 2016-12-28 NOTE — Progress Notes (Signed)
Anita James, is a 66 y.o. female  ZOX:[id number]  [id number]:[id number]  DOB - March 14, 1951  Chief Complaint  Patient presents with  . Hypertension  . Diabetes        Subjective:   Anita James is a 66 y.o. female here today for a follow up visit, last seen 11/25/16, for osa on cpap now, dm2, htn, morbid obesity, and Hiatal hernia, gerd.  Of note, she has been using her cpap at night, but still feels extremely fatigued in am. Co of severe tiredness currently. c/o of dry mouth the cpap as well. She notes always sleeping propped up w/ multiple pillows, unchanged. She notes intermittent le swelling as well sometimes during day, but usually gone by am. She watches her salt and eats a lot better now.   On better dm regimen, seeing Endo at [facility] now, last seen 11/26/16 and dm education  As well.  Co of heartburn and asked if dexilant could be increased from 30 to 76m since that appeared to help when she was in hospital.    [name], [name], [name], [name], No Cough. Denies cp.  No problems updated.  ALLERGIES: Allergies  Allergen Reactions  . Amoxicillin-Pot Clavulanate Nausea And Vomiting and Other (See Comments)    Combination of medications tear lining of stomach  . Sulfa Antibiotics Shortness Of Breath  . Azithromycin   . Norvasc [Amlodipine Besylate] Swelling    feet    PAST MEDICAL HISTORY: Past Medical History:  Diagnosis Date  . Anxiety   . Carpal tunnel syndrome 06/14/2014  . Chest pain   . [place] heart [place]   . Chronic back pain   . Chronic kidney disease 03/2013   failure due to meds  . [place] II, [place]   . Diverticulitis   . Frequent headaches   . Hiatal hernia   . Hyperlipemia   . Hypertension   . Hypothyroidism   . 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 Neuropathy ([place]   . Obesity   . Pneumonia   . Vitamin D deficiency 06/05/2014     MEDICATIONS AT HOME: Prior to Admission medications   Medication Sig Start Date End Date Taking? Authorizing Provider  acyclovir ointment (ZOVIRAX) 5 % Apply 1 application topically every 4 (four) hours. 11/04/16   OTresa Garter MD  atorvastatin (LIPITOR) 40 MG tablet Take 1 tablet (40 mg total) by mouth daily at 6 PM. 11/25/16   DMaren Reamer MD  Blood Glucose Monitoring Suppl (TRUE METRIX METER) w/Device KIT Use as instructed 09/08/16   OTresa Garter MD  celecoxib (CELEBREX) 50 MG capsule Take 1 capsule (50 mg total) by mouth 2 (two) times daily as needed for pain. 12/07/16   DMaren Reamer MD  Cetirizine HCl 10 MG CAPS Take 1 capsule (10 mg total) by mouth daily. 05/28/16   DMaren Reamer MD  cyclobenzaprine (FLEXERIL) 5 MG tablet Take 1 tablet (5 mg total) by mouth 3 (three) times daily as needed for muscle spasms. 01/13/16   EArnoldo Morale MD  diclofenac sodium (VOLTAREN) 1 % GEL Apply 4 g topically 4 (four) times daily as needed (Pain). 07/29/16   Shawn C Joy, PA-C  famotidine (PEPCID) 20 MG tablet Take 1 tablet (20 mg total) by mouth 2 (two) times daily. 12/28/16 12/28/17  DMaren Reamer MD  fluconazole (DIFLUCAN) 150 MG tablet Take 1 tablet (150  mg total) by mouth daily. Patient not taking: Reported on 12/28/2016 11/25/16   Maren Reamer, MD  furosemide (LASIX) 20 MG tablet Take 1 tablet (20 mg total) by mouth daily. Daily x 5 day - than stop, get dry weight after done. If +2 lbs more than base as needed, take lasix for 2 days, than stop 12/28/16   Maren Reamer, MD  gabapentin (NEURONTIN) 300 MG capsule Take 2 capsules (600 mg total) by mouth 2 (two) times daily. 11/25/16   Maren Reamer, MD  glucose blood (TRUE METRIX BLOOD GLUCOSE TEST) test strip Check glucose twice a day 09/09/16   Maren Reamer, MD  guaiFENesin (MUCINEX) 600 MG 12 hr tablet Take 1 tablet (600 mg total) by mouth 2 (two) times daily as needed. Patient not taking: Reported on 11/25/2016 05/28/16   Maren Reamer, MD  insulin aspart (NOVOLOG) 100 UNIT/ML FlexPen Inject 40 Units into the skin 3 (three) times daily with meals. 12/07/16   Maren Reamer, MD  Insulin Glargine (LANTUS SOLOSTAR) 100 UNIT/ML Solostar Pen Inject 70 Units into the skin daily at 10 pm. 12/07/16   Maren Reamer, MD  levothyroxine (SYNTHROID, LEVOTHROID) 88 MCG tablet Take 1 tablet (88 mcg total) by mouth daily. 12/07/16   Maren Reamer, MD  liraglutide 18 MG/3ML SOPN 1.8 mg daily 12/07/16   Maren Reamer, MD  lisinopril (PRINIVIL,ZESTRIL) 40 MG tablet Take 1 tablet (40 mg total) by mouth daily. 11/25/16   Maren Reamer, MD  loratadine (CLARITIN) 10 MG tablet Take 1 tablet (10 mg total) by mouth daily. 04/06/16   Maren Reamer, MD  metoprolol (TOPROL-XL) 200 MG 24 hr tablet Take 1 tablet (200 mg total) by mouth daily. 11/25/16   Maren Reamer, MD  mometasone (NASONEX) 50 MCG/ACT nasal spray Place 2 sprays into the nose daily. 11/10/16   Maren Reamer, MD  Multiple Vitamins-Minerals (EYE VITAMINS) CAPS Take 1 capsule by mouth daily. 08/11/12   Thurnell Lose, MD  nystatin (MYCOSTATIN/NYSTOP) powder Apply topically 4 (four) times daily. 11/25/16   Maren Reamer, MD  ranitidine (ZANTAC) 150 MG tablet Take 1 tablet (150 mg total) by mouth 2 (two) times daily. 12/28/16   Maren Reamer, MD     Objective:   Vitals:   12/28/16 1152  BP: 129/79  Pulse: 94  Resp: 16  Temp: 98.3 F (36.8 C)  TempSrc: Oral  SpO2: 90%  Weight: 274 lb (124.3 kg)    Exam General appearance : Awake, alert, not in any distress. Speech Clear. Not toxic looking HEENT: Atraumatic and Normocephalic, pupils equally reactive to light. Neck: supple, no JVD. No cervical lymphadenopathy.  Chest:Good air entry bilaterally, no added sounds. CVS: S1 S2 regular, no murmurs/gallups or rubs. Abdomen: Bowel sounds active, Non tender and not distended with no gaurding, rigidity or rebound. Extremities: B/L Lower Ext shows no edema, both  legs are warm to touch Neurology: Awake alert, and oriented X 3, CN II-XII grossly intact, Non focal Skin:No Rash  Data Review Lab Results  Component Value Date   HGBA1C 6.0 12/28/2016   HGBA1C 8.0 11/25/2016   HGBA1C 8.8 09/08/2016    Depression screen PHQ 2/9 12/16/2016 11/25/2016 11/04/2016 04/06/2016 02/28/2016  Decreased Interest (No Data) '3 3 1 3  ' Down, Depressed, Hopeless - '3 3 1 3  ' PHQ - 2 Score - '6 6 2 6  ' Altered sleeping - '3 3 3 3  ' Tired, decreased energy -  '3 3 1 3  ' Change in appetite - '1 3 1 3  ' Feeling bad or failure about yourself  - 3 0 3 3  Trouble concentrating - '3 3 1 3  ' Moving slowly or fidgety/restless - 1 2 0 3  Suicidal thoughts - 2 3 0 3  PHQ-9 Score - '22 23 11 27  ' Difficult doing work/chores - - - - Extremely dIfficult  Some recent data might be hidden      Assessment & Plan   1. Type 2 diabetes, uncontrolled, with neuropathy ([place]) Congratulated her on the dm control!  Continue same regimen. - seeing Endo at [facility] now. - POCT glucose (manual entry) - POCT glycosylated hemoglobin (Hb A1C) 6.0 - Basic metabolic panel  2. htn Well controlled Continue low salt diet Currently not on acei.  3. OSA on CPAP, w/ c/o of worsening fatigue - O2 Titration on Monitored CPAP; Future - dry mouth c/o, recd otc biotin mouthspray, keeping humidified air in home to prevent dryness.  4. Gastroesophageal reflux disease without esophagitis, hx of HH - H. pylori breath test; Future  - in 2 wks while off ppi - gerd diet discussed, d/w pt long term concerns of ppi w/ ckd. - pepcid/zantac bid for now, tight gerd diet, prn tums   5. SOB (shortness of breath), w/ hx of diastolic chf - lasix 17IO qd x 5days and stop. Asked pt to check base weight after 5 days, if gains +2lbs, than take 2 days of lasix, than stop. - Brain natriuretic peptide  6. Hypothyroidism, unspecified type On synthroid.  - TSH     Patient have been counseled extensively about nutrition and  exercise  Return in about 4 weeks (around 01/25/2017) for htn /sob.  The patient was given clear instructions to go to ER or return to medical center if symptoms don't improve, worsen or new problems develop. The patient verbalized understanding. The patient was told to call to get lab results if they haven't heard anything in the next week.   This note has been created with Surveyor, quantity. Any transcriptional errors are unintentional.   Maren Reamer, MD, [place] and [facility], [place]   12/28/2016, 12:18 PM

## 2016-12-28 NOTE — Telephone Encounter (Signed)
Patient called stating her job will not accept the letter stating there are no restrictions Patient states it needs to say that she can drive Patient requesting a call when new letter is ready

## 2016-12-28 NOTE — Patient Instructions (Addendum)
-   lab test   2wks = UREA BREATH TEST - needs to be off Loiza for Gastroesophageal Reflux Disease, Adult When you have gastroesophageal reflux disease (GERD), the foods you eat and your eating habits are very important. Choosing the right foods can help ease your discomfort. What guidelines do I need to follow?  Choose fruits, vegetables, whole grains, and low-fat dairy products.  Choose low-fat meat, fish, and poultry.  Limit fats such as oils, salad dressings, butter, nuts, and avocado.  Keep a food diary. This helps you identify foods that cause symptoms.  Avoid foods that cause symptoms. These may be different for everyone.  Eat small meals often instead of 3 large meals a day.  Eat your meals slowly, in a place where you are relaxed.  Limit fried foods.  Cook foods using methods other than frying.  Avoid drinking alcohol.  Avoid drinking large amounts of liquids with your meals.  Avoid bending over or lying down until 2-3 hours after eating. What foods are not recommended? These are some foods and drinks that may make your symptoms worse: Vegetables  Tomatoes. Tomato juice. Tomato and spaghetti sauce. Chili peppers. Onion and garlic. Horseradish. Fruits  Oranges, grapefruit, and lemon (fruit and juice). Meats  High-fat meats, fish, and poultry. This includes hot dogs, ribs, ham, sausage, salami, and bacon. Dairy  Whole milk and chocolate milk. Sour cream. Cream. Butter. Ice cream. Cream cheese. Drinks  Coffee and tea. Bubbly (carbonated) drinks or energy drinks. Condiments  Hot sauce. Barbecue sauce. Sweets/Desserts  Chocolate and cocoa. Donuts. Peppermint and spearmint. Fats and Oils  High-fat foods. This includes [place] fries and potato chips. Other  Vinegar. Strong spices. This includes black pepper, white pepper, red pepper, cayenne, curry powder, cloves, ginger, and chili powder. The items listed above may not be a complete list of foods  and drinks to avoid. Contact your dietitian for more information.  This information is not intended to replace advice given to you by your health care provider. Make sure you discuss any questions you have with your health care provider. Document Released: 03/08/2012 Document Revised: 02/13/2016 Document Reviewed: 07/12/2013 Elsevier Interactive Patient Education  2017 [employer].

## 2016-12-29 ENCOUNTER — Encounter: Payer: Self-pay | Admitting: Internal Medicine

## 2016-12-29 LAB — BASIC METABOLIC PANEL
BUN/Creatinine Ratio: 12 (ref 12–28)
BUN: 10 mg/dL (ref 8–27)
CALCIUM: 9.6 mg/dL (ref 8.7–10.3)
CHLORIDE: 103 mmol/L (ref 96–106)
CO2: 28 mmol/L (ref 18–29)
Creatinine, Ser: 0.82 mg/dL (ref 0.57–1.00)
GFR calc non Af Amer: 76 mL/min/{1.73_m2} (ref >59)
GFR, EST AFRICAN AMERICAN: 88 mL/min/{1.73_m2} (ref >59)
GLUCOSE: 64 mg/dL — AB (ref 65–99)
POTASSIUM: 3.9 mmol/L (ref 3.5–5.2)
Sodium: 146 mmol/L — ABNORMAL HIGH (ref 134–144)

## 2016-12-29 LAB — BRAIN NATRIURETIC PEPTIDE: BNP: 9.6 pg/mL (ref 0.0–100.0)

## 2016-12-29 LAB — TSH: TSH: 0.928 u[IU]/mL (ref 0.450–4.500)

## 2016-12-29 NOTE — Telephone Encounter (Signed)
Will forward to pcp

## 2017-01-07 ENCOUNTER — Encounter (HOSPITAL_COMMUNITY): Payer: Self-pay

## 2017-01-07 ENCOUNTER — Ambulatory Visit
Admission: RE | Admit: 2017-01-07 | Discharge: 2017-01-07 | Disposition: A | Discharge status: Home / Self Care | Payer: No Typology Code available for payment source | Source: Ambulatory Visit | Attending: Obstetrics and Gynecology | Admitting: Obstetrics and Gynecology

## 2017-01-07 ENCOUNTER — Ambulatory Visit (HOSPITAL_COMMUNITY)
Admission: RE | Admit: 2017-01-07 | Discharge: 2017-01-07 | Disposition: A | Discharge status: Home / Self Care | Payer: Self-pay | Source: Ambulatory Visit | Attending: Obstetrics and Gynecology | Admitting: Obstetrics and Gynecology

## 2017-01-07 VITALS — BP 122/78 | Temp 98.1°F | Ht 66.0 in | Wt 269.8 lb

## 2017-01-07 DIAGNOSIS — N644 Mastodynia: Secondary | ICD-10-CM

## 2017-01-07 DIAGNOSIS — Z01419 Encounter for gynecological examination (general) (routine) without abnormal findings: Secondary | ICD-10-CM

## 2017-01-07 NOTE — Progress Notes (Signed)
Complaints of left inner breast pain. Patient rates pain at a 3 out of 10.  Pap Smear: Pap smear completed today. Patient unsure when last Pap smear was completed. Per patient has no history of an abnormal Pap smear. No Pap smear results are in EPIC.  Physical exam: Breasts Breasts symmetrical. No skin abnormalities bilateral breasts. No nipple retraction bilateral breasts. No nipple discharge bilateral breasts. No lymphadenopathy. No lumps palpated bilateral breasts. Complaints of left inner breast tenderness at 10 o'clock 13 cm from the nipple on exam. Referred patient to the [facility] for a diagnostic mammogram and possible left breast ultrasound. Appointment scheduled for Thursday, January 07, 2017 at 1520.  Pelvic/Bimanual   Ext Genitalia No lesions, no swelling and no discharge observed on external genitalia.         Vagina Vagina pink and normal texture. No lesions or discharge observed in vagina.          Cervix Cervix is present. Cervix pink and of normal texture. No discharge observed.     Uterus Uterus is present and palpable. Uterus in normal position and normal size.        Adnexae Bilateral ovaries present and unable to palpate. No tenderness on palpation.          Rectovaginal No rectal exam completed today since patient had no rectal complaints. No skin abnormalities observed on exam.    Smoking History: Patient has never smoked.  Patient Navigation: Patient education provided. Access to services provided for patient through [facility] program.   Colorectal Cancer Screening: Per patient had a colonoscopy completed less than 10 years ago. No complaints today. FIT Test given to patient to complete and return to [facility].

## 2017-01-07 NOTE — Patient Instructions (Signed)
Explained breast self awareness with [name]. Let patient know [facility] will cover Pap smears and HPV typing every 5 years unless has a history of abnormal Pap smears. Referred patient to the [facility] for a diagnostic mammogram and possible left breast ultrasound. Appointment scheduled for Thursday, January 07, 2017 at 1520. Let patient know will follow up with her within the next couple weeks with results of Pap smear by phone. Anita James verbalized understanding.  [name], [name], RN 3:01 PM

## 2017-01-08 LAB — CYTOLOGY - PAP
Diagnosis: NEGATIVE
HPV: NOT DETECTED

## 2017-01-11 ENCOUNTER — Encounter (HOSPITAL_COMMUNITY): Payer: Self-pay | Admitting: *Deleted

## 2017-01-11 ENCOUNTER — Other Ambulatory Visit: Payer: Self-pay

## 2017-01-13 LAB — H. PYLORI BREATH TEST

## 2017-01-13 LAB — H.PYLORI BREATH TEST (REFLEX): H. PYLORI BREATH TEST: NEGATIVE

## 2017-01-14 ENCOUNTER — Ambulatory Visit: Payer: Self-pay | Admitting: Registered"

## 2017-01-15 ENCOUNTER — Encounter (HOSPITAL_BASED_OUTPATIENT_CLINIC_OR_DEPARTMENT_OTHER): Payer: No Typology Code available for payment source

## 2017-01-18 ENCOUNTER — Ambulatory Visit: Payer: Self-pay | Attending: Internal Medicine | Admitting: Internal Medicine

## 2017-01-18 ENCOUNTER — Telehealth: Payer: Self-pay

## 2017-01-18 ENCOUNTER — Ambulatory Visit: Payer: Self-pay

## 2017-01-18 ENCOUNTER — Other Ambulatory Visit: Payer: Self-pay | Admitting: Internal Medicine

## 2017-01-18 ENCOUNTER — Encounter: Payer: Self-pay | Admitting: Internal Medicine

## 2017-01-18 VITALS — BP 95/63 | HR 94 | Temp 97.4°F | Resp 16 | Wt 276.0 lb

## 2017-01-18 DIAGNOSIS — Z880 Allergy status to penicillin: Secondary | ICD-10-CM | POA: Insufficient documentation

## 2017-01-18 DIAGNOSIS — E1165 Type 2 diabetes mellitus with hyperglycemia: Secondary | ICD-10-CM | POA: Insufficient documentation

## 2017-01-18 DIAGNOSIS — E1122 Type 2 diabetes mellitus with diabetic chronic kidney disease: Secondary | ICD-10-CM | POA: Insufficient documentation

## 2017-01-18 DIAGNOSIS — Z6841 Body Mass Index (BMI) 40.0 and over, adult: Secondary | ICD-10-CM | POA: Insufficient documentation

## 2017-01-18 DIAGNOSIS — K219 Gastro-esophageal reflux disease without esophagitis: Secondary | ICD-10-CM | POA: Insufficient documentation

## 2017-01-18 DIAGNOSIS — Z794 Long term (current) use of insulin: Secondary | ICD-10-CM | POA: Insufficient documentation

## 2017-01-18 DIAGNOSIS — Z882 Allergy status to sulfonamides status: Secondary | ICD-10-CM | POA: Insufficient documentation

## 2017-01-18 DIAGNOSIS — IMO0002 Reserved for concepts with insufficient information to code with codable children: Secondary | ICD-10-CM

## 2017-01-18 DIAGNOSIS — E559 Vitamin D deficiency, unspecified: Secondary | ICD-10-CM | POA: Insufficient documentation

## 2017-01-18 DIAGNOSIS — R0789 Other chest pain: Secondary | ICD-10-CM

## 2017-01-18 DIAGNOSIS — I509 Heart failure, unspecified: Secondary | ICD-10-CM | POA: Insufficient documentation

## 2017-01-18 DIAGNOSIS — E1140 Type 2 diabetes mellitus with diabetic neuropathy, unspecified: Secondary | ICD-10-CM | POA: Insufficient documentation

## 2017-01-18 DIAGNOSIS — E039 Hypothyroidism, unspecified: Secondary | ICD-10-CM | POA: Insufficient documentation

## 2017-01-18 DIAGNOSIS — Z881 Allergy status to other antibiotic agents status: Secondary | ICD-10-CM | POA: Insufficient documentation

## 2017-01-18 DIAGNOSIS — Z1159 Encounter for screening for other viral diseases: Secondary | ICD-10-CM | POA: Insufficient documentation

## 2017-01-18 DIAGNOSIS — G4733 Obstructive sleep apnea (adult) (pediatric): Secondary | ICD-10-CM | POA: Insufficient documentation

## 2017-01-18 DIAGNOSIS — Z9989 Dependence on other enabling machines and devices: Secondary | ICD-10-CM

## 2017-01-18 DIAGNOSIS — E785 Hyperlipidemia, unspecified: Secondary | ICD-10-CM | POA: Insufficient documentation

## 2017-01-18 DIAGNOSIS — Z1211 Encounter for screening for malignant neoplasm of colon: Secondary | ICD-10-CM | POA: Insufficient documentation

## 2017-01-18 DIAGNOSIS — I130 Hypertensive heart and chronic kidney disease with heart failure and stage 1 through stage 4 chronic kidney disease, or unspecified chronic kidney disease: Secondary | ICD-10-CM | POA: Insufficient documentation

## 2017-01-18 DIAGNOSIS — Z888 Allergy status to other drugs, medicaments and biological substances status: Secondary | ICD-10-CM | POA: Insufficient documentation

## 2017-01-18 DIAGNOSIS — N189 Chronic kidney disease, unspecified: Secondary | ICD-10-CM | POA: Insufficient documentation

## 2017-01-18 LAB — POCT CBG (FASTING - GLUCOSE)-MANUAL ENTRY: GLUCOSE FASTING, POC: 167 mg/dL — AB (ref 70–99)

## 2017-01-18 MED ORDER — DEXLANSOPRAZOLE 60 MG PO CPDR: 60.0000 mg | DELAYED_RELEASE_CAPSULE | Freq: Every day | ORAL | 3 refills | Status: DC

## 2017-01-18 MED ORDER — GLUCOSE BLOOD VI STRP: ORAL_STRIP | 12 refills | Status: DC

## 2017-01-18 MED ORDER — INSULIN ASPART 100 UNIT/ML FLEXPEN: 30.0000 [IU] | PEN_INJECTOR | Freq: Three times a day (TID) | SUBCUTANEOUS | 3 refills | Status: DC

## 2017-01-18 MED FILL — TRUE METRIX TEST STRIP: 50 days supply | Qty: 100 | Fill #0

## 2017-01-18 MED FILL — FUROSEMIDE 20 MG TABLET: 20 | 30 days supply | Qty: 30 | Fill #0

## 2017-01-18 NOTE — Progress Notes (Signed)
Anita James, is a 66 y.o. female  ZCH:[id number]  [id number]  DOB - [birth year]  Chief Complaint  Patient presents with  . Hypertension  . Shortness of Breath        Subjective:   Anita James is a 66 y.o. female here today for a follow up visit, last seen 12/28/16, osa on cpap now, dm2, htn, morbid obesity, and Hiatal hernia, gerd.  Of notes, when I got her off dexilant and  Trial pepcid/zantac, her heart burn seems much worse.  Neg h pylori on recent screening. She says since ppi taking off, feels chest pressure, like "elephant" on her chest, non radiating. Denies chest pain though currently. Has not had stress test in long time.  Of note, still using her cpap, not as tired, but still feels does not fit well. She initially has appt w/ sleep but than was canceled.  She told me she was instructed to call back if still having problems.  Overall, doing ok w/ her dm. Home cbg readings mostly 120-140s, occasional 200 and 262 noted recently.  Taking novolog 30units w/ meals, lantus 70 qd, and victoza. She stopped her night time snack which seems to be helping her blood sugars more.  Has not been able to lose any weight though.  c/o of bilat wrist pains as well, numbness bilat hands in the 4th and 5th digits.   She took extra lasix this past weekend, which helped her leg swelling considerably.  [name], [name], [name], [name], [name]  No problems updated.  ALLERGIES: Allergies  Allergen Reactions  . Amoxicillin-Pot Clavulanate Nausea And Vomiting and Other (See Comments)    Combination of medications tear lining of stomach  . Sulfa Antibiotics Shortness Of Breath  . Azithromycin   . Norvasc [Amlodipine Besylate] Swelling    feet    PAST MEDICAL HISTORY: Past Medical History:  Diagnosis Date  . Anxiety   . Carpal tunnel syndrome 06/14/2014  . Chest pain   . CHF (congestive heart failure)  ([place])   . Chronic back pain   . Chronic kidney disease 03/2013   failure due to meds  . Diabetes mellitus type II, uncontrolled ([place])   . Diverticulitis   . Frequent headaches   . Hiatal hernia   . Hyperlipemia   . Hypertension   . Hypothyroidism   . 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 Neuropathy   . Obesity   . Pneumonia   . Vitamin D deficiency 06/05/2014    MEDICATIONS AT HOME: Prior to Admission medications   Medication Sig Start Date End Date Taking? Authorizing Provider  acyclovir ointment (ZOVIRAX) 5 % Apply 1 application topically every 4 (four) hours. 11/04/16   Tresa Garter, MD  atorvastatin (LIPITOR) 40 MG tablet Take 1 tablet (40 mg total) by mouth daily at 6 PM. 11/25/16   Maren Reamer, MD  Blood Glucose Monitoring Suppl (TRUE METRIX METER) w/Device KIT Use as instructed 09/08/16   Tresa Garter, MD  celecoxib (CELEBREX) 50 MG capsule Take 1 capsule (50 mg total) by mouth 2 (two) times daily as needed for pain. 12/07/16   Maren Reamer, MD  Cetirizine HCl 10 MG CAPS Take 1 capsule (10 mg total) by mouth daily. 05/28/16   Maren Reamer, MD  cyclobenzaprine (FLEXERIL) 5 MG tablet Take 1 tablet (5 mg total) by mouth 3 (three) times daily as needed  for muscle spasms. 01/13/16   Arnoldo Morale, MD  dexlansoprazole (DEXILANT) 60 MG capsule Take 1 capsule (60 mg total) by mouth daily. 01/18/17   Maren Reamer, MD  diclofenac sodium (VOLTAREN) 1 % GEL Apply 4 g topically 4 (four) times daily as needed (Pain). 07/29/16   Shawn C Joy, PA-C  fluconazole (DIFLUCAN) 150 MG tablet Take 1 tablet (150 mg total) by mouth daily. Patient not taking: Reported on 12/28/2016 11/25/16   Maren Reamer, MD  furosemide (LASIX) 20 MG tablet Take 1 tablet (20 mg total) by mouth daily. Daily x 5 day - than stop, get dry weight after done. If +2 lbs more than base as needed, take lasix for 2 days, than stop 12/28/16   Maren Reamer, MD  gabapentin (NEURONTIN) 300 MG capsule Take 2  capsules (600 mg total) by mouth 2 (two) times daily. 11/25/16   Maren Reamer, MD  glucose blood (TRUE METRIX BLOOD GLUCOSE TEST) test strip Check glucose twice a day 01/18/17   Maren Reamer, MD  guaiFENesin (MUCINEX) 600 MG 12 hr tablet Take 1 tablet (600 mg total) by mouth 2 (two) times daily as needed. Patient not taking: Reported on 11/25/2016 05/28/16   Maren Reamer, MD  insulin aspart (NOVOLOG) 100 UNIT/ML FlexPen Inject 30 Units into the skin 3 (three) times daily with meals. 01/18/17   Maren Reamer, MD  Insulin Glargine (LANTUS SOLOSTAR) 100 UNIT/ML Solostar Pen Inject 70 Units into the skin daily at 10 pm. 12/07/16   Maren Reamer, MD  levothyroxine (SYNTHROID, LEVOTHROID) 88 MCG tablet Take 1 tablet (88 mcg total) by mouth daily. 12/07/16   Maren Reamer, MD  liraglutide 18 MG/3ML SOPN 1.8 mg daily 12/07/16   Maren Reamer, MD  lisinopril (PRINIVIL,ZESTRIL) 40 MG tablet Take 1 tablet (40 mg total) by mouth daily. 11/25/16   Maren Reamer, MD  loratadine (CLARITIN) 10 MG tablet Take 1 tablet (10 mg total) by mouth daily. 04/06/16   Maren Reamer, MD  metoprolol (TOPROL-XL) 200 MG 24 hr tablet Take 1 tablet (200 mg total) by mouth daily. 11/25/16   Maren Reamer, MD  mometasone (NASONEX) 50 MCG/ACT nasal spray Place 2 sprays into the nose daily. 11/10/16   Maren Reamer, MD  Multiple Vitamins-Minerals (EYE VITAMINS) CAPS Take 1 capsule by mouth daily. 08/11/12   Thurnell Lose, MD  nystatin (MYCOSTATIN/NYSTOP) powder Apply topically 4 (four) times daily. 11/25/16   Maren Reamer, MD     Objective:   Vitals:   01/18/17 1424  BP: 95/63  Pulse: 94  Resp: 16  Temp: 97.4 F (36.3 C)  TempSrc: Oral  SpO2: 95%  Weight: 276 lb (125.2 kg)    Exam General appearance : Awake, alert, not in any distress. Speech Clear. Not toxic looking, morbid obese, pleasant HEENT: Atraumatic and Normocephalic, pupils equally reactive to light. Neck: supple, no JVD.  Chest:Good  air entry bilaterally, no added sounds.  No reproducible pain on exam. CVS: S1 S2 regular, no murmurs/gallups or rubs. Abdomen: Bowel sounds active, obese, Non tender and not distended Extremities: B/L Lower Ext shows no edema, both legs are warm to touch, mild tenderness on right wrist, but neg Phalen test bilat. Neurology: Awake alert, and oriented X 3, CN II-XII grossly intact, Non focal Skin:No Rash  Data Review Lab Results  Component Value Date   HGBA1C 6.0 12/28/2016   HGBA1C 8.0 11/25/2016   HGBA1C 8.8 09/08/2016  Depression screen [facility] 2/9 01/18/2017 12/16/2016 11/25/2016 11/04/2016 04/06/2016  Decreased Interest 2 (No Data) _0 Down, Depressed, Hopeless 2 - _1 PHQ - 2 Score 4 - _2 Altered sleeping 3 - _3 Tired, decreased energy 3 - _4 Change in appetite 1 - _5 Feeling bad or failure about yourself  3 - 3 0 3  Trouble concentrating 3 - _6 Moving slowly or fidgety/restless 2 - 1 2 0  Suicidal thoughts 1 - 2 3 0  PHQ-9 Score 20 - _7 Difficult doing work/chores - - - - -  Some recent data might be hidden      Assessment & Plan   1. Type 2 diabetes, uncontrolled, with neuropathy ([place]) - continue same insulin regimen, she is now seeing Endo at [facility] - insulin aspart (NOVOLOG) 100 UNIT/ML FlexPen; Inject 30 Units into the skin 3 (three) times daily with meals.  Dispense: 30 mL; Refill: 3 - CBC with Differential - Glucose (CBG), Fasting - renewed strips  2. Chest pressure - suspect all gi related, but given pt's significant comorbidities, will refer to cards for further reval - Ambulatory referral to Cardiology  3. OSA on CPAP - she initially had appt w/ sleep study clinic this Friday, but somehow canceled. Will ask Sleep to reeval her cpap for [place] if able, if not, Pulm to help re-eval fit. - O2 Titration on Monitored CPAP; Future  4. Need for hepatitis C screening test - Hepatitis C antibody  5. Colon cancer screening -  Last  cscope 2014 in outside system - Ambulatory referral to Gastroenterology  6. Recent MM w/ [place] and [place] neg - repeat in 1 year.  7. gerd Suspected cause of her chest pressure. Long discussion re: studies gerd/ckd correlation. Pt wants to try back on dexilant for now, rx dexilant 60 qd, trial wean when better Dc pepcid and zantac.   Patient have been counseled extensively about nutrition and exercise  Return in about 2 months (around 03/20/2017).  The patient was given clear instructions to go to ER or return to medical center if symptoms don't improve, worsen or new problems develop. The patient verbalized understanding. The patient was told to call to get lab results if they haven't heard anything in the next week.   This note has been created with Surveyor, quantity. Any transcriptional errors are unintentional.   Maren Reamer, MD, [place] and [facility] [place], [place]   01/18/2017, 2:42 PM

## 2017-01-18 NOTE — Patient Instructions (Signed)
QUICK START PATIENT GUIDE TO LCHF/IF  - weight loss LOW CARB HIGH FAT / INTERMITTENT FASTING  Recommend: <50 gram carbohydrate a day for weightloss.  What is this diet and how does it work? o Insulin is a hormone made by your body that allows you to use sugar (glucose) from carbohydrates in the food you eat for energy or to store glucose (as fat) for future use  o Insulin levels need to be lowered in order to utilize our stored energy (fat) o Many struggling with obesity are insulin resistant and have high levels of insulin o This diet works to lower your insulin in two ways o Fasting - allows your insulin levels to naturally decrease  o Avoiding carbohydrates - carbs trigger increase in insulin Low Carb Healthy Fat (LCHF) o Get a free app for your phone, such as MyFitnessPal, to help you track your macronutrients (carbs/protein/fats) and to track your weight and body measurements to see your progress o Set your goal for around 10% carbs/20% protein/70% fat o A good starting goal for amount of net carbs per day is 50 grams (some will aim for 20 grams) o "Net carbs" refers to total grams of carbs minus grams of fiber (as fiber is not typically absorbed). For example, if a food has 5g total carb and 3g fiber, that would be 2g net carbs o Increase healthy fats - eg. olive oil, eggs, nuts, avocado, cheese, butter, coconut, meats, fish o Avoid high carb foods - eg. bread, pasta, potatoes, rice, cookies, soda, juice, anything sugary o Buy full-fat ingredients (avoid low-fat versions, which often have more sugar) o No need to count calories, but pay close attention to grams of carbs on labels Intermittent Fasting (IF) o "Fasting" is going a period of time without eating - it helps to stay busy and well-hydrated o Purpose of fasting is to allow insulin levels to drop as low as possible, allowing your body to switch into fat-burning mode o With this diet there are many approaches to fasting, but 16:8  and 24hr fasts are commonly used o 16:8 fast, usually 5-7 days a week - Fasting for 16 hours of the day, then eating all meals for the day over course of 8 hours. o 24 hour fast, usually 1-3 days a week - Typically eating one meal a day, then fasting until the next day. Plenty of fluids (and some salt to help you hold onto fluids) are recommended during longer fasts.  o During fasts certain beverages are still acceptable - water, sparkling water, bone broth, black tea or coffee, or tea/coffee with small amount of heavy whipping cream Special note for those on diabetic medications o Discuss your medications with your physician. You may need to hold your medication or adjust to only taking when eating. Diabetics should keep close track of their blood sugars when making any changes to diet/meds, to ensure they are staying within normal limits For more info about LCHF/IF o Watch "Therapeutic Fasting - Solving the Two-Compartment Problem" video by Dr. Sharman Cheek on YouTube (GreatestGyms.com.ee) for a great intro to these concepts o Read "The Obesity Code" and/or "The Complete Guide to Fasting" by Dr. Sharman Cheek o Go to www.dietdoctor.com for explanations, recipes, and infographics about foods to eat/avoid o Get a Free smartphone app that helps count carbohydrates  - ie MyKeto EXAMPLES TO GET STARTED Fasting Beverages -water (can add  tsp Pink Himalayan salt once or twice a day to help stay hydrated for longer fasts) -  Sparkling water (such as [employer] or similar; avoid any with artificial sweeteners)  -Bone broth (multiple recipes available online or can buy pre-made) -Tea or Coffee (Adding heavy whipping cream or coconut oil to your tea or coffee can be helpful if you find yourself getting too hungry during the fasts. Can also add cinnamon for flavor. Or "bulletproof coffee.") Low Carb Healthy Fat Breakfast (if not fasting) -eggs in butter or olive oil with avocado -omelet  with veggies and cheese  Lunch -hamburger with cheese and avocado wrapped in lettuce (no bun, no ketchup) -meat and cheese wrapped in lettuce (can dip in mustard or olive oil/vinegar/mayo) -salad with meat/cheese/nuts and higher fat dressing (vinaigrette or Ranch, etc) -tuna salad lettuce wrap -taco meat with cheese, sour cream, guacamole, cheese over lettuce  Dinner -steak with herb butter or Barnaise sauce -"Fathead" pizza (uses cheese and almond flour for the dough - several recipes available online) -roasted or grilled chicken with skin on, with low carb sauce (buffalo, garlic butter, alfredo, pesto, etc) -baked salmon with lemon butter -chicken alfredo with zucchini noodles -[place] butter chicken with low carb garlic naan -egg roll in a bowl  Side Dishes -mashed cauliflower (homemade or available in freezer section) -roast vegetables (green veggies that grow above ground rather than root veggies) with butter or cheese -Caprese salad (fresh mozzarella, tomato and basil with olive oil) -homemade low-carb coleslaw Snacks/Desserts (try to avoid unnecessary snacking and sweets in general) -celery or cucumber dipped in guacamole or sour cream dip -cheese and meat slices  -raspberries with whipped cream (can make homemade with no sugar added) -low carb [place] butter cake  AVOID - sugar, diet/regular soda, potatoes, breads, rice, pasta, candy, cookies, cakes, muffins, juice, high carb fruit (bananas, grapes), beer, ketchup, barbeque and other sweet sauces  -   Food Choices for Gastroesophageal Reflux Disease, Adult When you have gastroesophageal reflux disease (GERD), the foods you eat and your eating habits are very important. Choosing the right foods can help ease your discomfort. What guidelines do I need to follow?  Choose fruits, vegetables, whole grains, and low-fat dairy products.  Choose low-fat meat, fish, and poultry.  Limit fats such as oils, salad dressings,  butter, nuts, and avocado.  Keep a food diary. This helps you identify foods that cause symptoms.  Avoid foods that cause symptoms. These may be different for everyone.  Eat small meals often instead of 3 large meals a day.  Eat your meals slowly, in a place where you are relaxed.  Limit fried foods.  Cook foods using methods other than frying.  Avoid drinking alcohol.  Avoid drinking large amounts of liquids with your meals.  Avoid bending over or lying down until 2-3 hours after eating. What foods are not recommended? These are some foods and drinks that may make your symptoms worse: Vegetables  Tomatoes. Tomato juice. Tomato and spaghetti sauce. Chili peppers. Onion and garlic. Horseradish. Fruits  Oranges, grapefruit, and lemon (fruit and juice). Meats  High-fat meats, fish, and poultry. This includes hot dogs, ribs, ham, sausage, salami, and bacon. Dairy  Whole milk and chocolate milk. Sour cream. Cream. Butter. Ice cream. Cream cheese. Drinks  Coffee and tea. Bubbly (carbonated) drinks or energy drinks. Condiments  Hot sauce. Barbecue sauce. Sweets/Desserts  Chocolate and cocoa. Donuts. Peppermint and spearmint. Fats and Oils  High-fat foods. This includes [place] fries and potato chips. Other  Vinegar. Strong spices. This includes black pepper, white pepper, red pepper, cayenne, curry powder, cloves, ginger, and  chili powder. The items listed above may not be a complete list of foods and drinks to avoid. Contact your dietitian for more information.  This information is not intended to replace advice given to you by your health care provider. Make sure you discuss any questions you have with your health care provider. Document Released: 03/08/2012 Document Revised: 02/13/2016 Document Reviewed: 07/12/2013 Elsevier Interactive Patient Education  2017 [employer].

## 2017-01-18 NOTE — Telephone Encounter (Signed)
Message received from Dr Janne Napoleon noting that the patient's appointment at the sleep center was cancelled on Friday and she was requesting follow up .  Call placed to the [facility]. Spoke to [place] who stated that the appointment was cancelled per the supervisor. The next available appointment is 03/14/17 but the patient will be placed at the top of the cancellation list and she noted that there are frequent cancellations.  Attempted to contact the patient to notify her that she will be placed on the cancellation call list.  Calls placed [phone] (H) [phone] (M) and HIPAA compliant voicemail messages were left requesting a call back to # [phone].   Update provided to Dr Janne Napoleon.

## 2017-01-19 LAB — CBC WITH DIFFERENTIAL/PLATELET
Basophils Absolute: 0 10*3/uL (ref 0.0–0.2)
Basos: 0 %
EOS (ABSOLUTE): 0 10*3/uL (ref 0.0–0.4)
EOS: 1 %
HEMATOCRIT: 36.3 % (ref 34.0–46.6)
HEMOGLOBIN: 12.2 g/dL (ref 11.1–15.9)
Immature Grans (Abs): 0 10*3/uL (ref 0.0–0.1)
Immature Granulocytes: 0 %
LYMPHS ABS: 1.6 10*3/uL (ref 0.7–3.1)
Lymphs: 21 %
MCH: 30.4 pg (ref 26.6–33.0)
MCHC: 33.6 g/dL (ref 31.5–35.7)
MCV: 91 fL (ref 79–97)
Monocytes Absolute: 0.5 10*3/uL (ref 0.1–0.9)
Monocytes: 7 %
NEUTROS ABS: 5.3 10*3/uL (ref 1.4–7.0)
Neutrophils: 71 %
Platelets: 221 10*3/uL (ref 150–379)
RBC: 4.01 x10E6/uL (ref 3.77–5.28)
RDW: 13.9 % (ref 12.3–15.4)
WBC: 7.5 10*3/uL (ref 3.4–10.8)

## 2017-01-19 LAB — HEPATITIS C ANTIBODY: Hep C Virus Ab: <0.1 s/co ratio (ref 0.0–0.9)

## 2017-01-20 MED FILL — LISINOPRIL 40 MG TABLET: 40 | 30 days supply | Qty: 30 | Fill #0

## 2017-01-20 MED FILL — GABAPENTIN 300 MG CAPSULE: 300 | 30 days supply | Qty: 120 | Fill #0

## 2017-01-25 ENCOUNTER — Other Ambulatory Visit (HOSPITAL_BASED_OUTPATIENT_CLINIC_OR_DEPARTMENT_OTHER): Payer: No Typology Code available for payment source | Admitting: Radiology

## 2017-01-25 ENCOUNTER — Telehealth: Payer: Self-pay | Admitting: Internal Medicine

## 2017-01-25 ENCOUNTER — Other Ambulatory Visit: Payer: Self-pay | Admitting: Pharmacist

## 2017-01-25 ENCOUNTER — Other Ambulatory Visit: Payer: Self-pay | Admitting: *Deleted

## 2017-01-25 ENCOUNTER — Telehealth: Payer: Self-pay

## 2017-01-25 MED ORDER — DEXLANSOPRAZOLE 60 MG PO CPDR: 60.0000 mg | DELAYED_RELEASE_CAPSULE | Freq: Every day | ORAL | 3 refills | Status: DC

## 2017-01-25 NOTE — Telephone Encounter (Signed)
Call received from the patient stating that she has been having problems  with her CPAP machine this weekend She noted that it got " too hot" and she had to let it cool down for a couple of hours.   This CM reminded her that she has an appointment at the [facility] this afternoon at 1400 and instructed her to bring her machine with her to the appointment to have it checked. She said that the appointment is for a mask fitting but this CM stated that she should still bring her machine with her and explain what has occurred.   Call placed to [facility] and spoke to [place] regarding the patient's problem with her CPAP.  Coralyn Mark said that they had instructed the patient to bring all of her CPAP supplies , including the machine, to her appointment.

## 2017-01-25 NOTE — Telephone Encounter (Signed)
PT, called, she want to speak with you since she wet to the sleep study and told her that is cpac was not working and she was told to contact you since they can't so anything for her, that you the only one can help help her please follow up

## 2017-01-25 NOTE — Telephone Encounter (Signed)
Call placed to the patient and informed her that this CM spoke to the Sleep Center and they would like her to bring her CPAP machine with her to her appointment. She then said that she already knew that.

## 2017-01-26 NOTE — Telephone Encounter (Signed)
This CM explained the status of the patient's CPAP machine problem of overheating to [facility]. There is no warranty with the machines obtained through the [employer] - CPAP Assistance Program. Anita James noted that [facility] is not obligated to purchase another machine for the patient. This CM can provide her with resources to obtain her own machine.   Call placed to the [facility] to inquire about the outcome  of the patient's appointment yesterday. Spoke to Anita James who stated that the patient's mask was refit because it was too big for her. She noted that nothing was seen to be wrong with the machine and there was nothing that could be done with it.   This CM contacted the patient to inform her that there is no warranty with the CPAP machines provided by [employer] - CPAP Assistance Program and the [facility] is not obligated to purchase CPAP machines for the patients.  The patient stated that she has only had the machine for 2 months and then hung up. She did not allow this CM to provide resource information.

## 2017-01-28 ENCOUNTER — Encounter (HOSPITAL_COMMUNITY): Payer: Self-pay | Admitting: *Deleted

## 2017-01-28 NOTE — Progress Notes (Signed)
Letter mailed to patient informing of negative pap smear results. Next pap smear due in five years.

## 2017-02-04 ENCOUNTER — Encounter: Payer: Self-pay | Admitting: Internal Medicine

## 2017-02-05 ENCOUNTER — Encounter: Payer: Self-pay | Admitting: Internal Medicine

## 2017-02-08 ENCOUNTER — Encounter: Payer: Self-pay | Admitting: Internal Medicine

## 2017-02-11 ENCOUNTER — Encounter: Payer: Self-pay | Admitting: Cardiovascular Disease

## 2017-02-11 ENCOUNTER — Telehealth: Payer: Self-pay | Admitting: Internal Medicine

## 2017-02-11 ENCOUNTER — Ambulatory Visit (INDEPENDENT_AMBULATORY_CARE_PROVIDER_SITE_OTHER): Payer: Self-pay | Admitting: Cardiovascular Disease

## 2017-02-11 VITALS — BP 151/89 | HR 88 | Ht 66.0 in | Wt 276.0 lb

## 2017-02-11 DIAGNOSIS — E785 Hyperlipidemia, unspecified: Secondary | ICD-10-CM

## 2017-02-11 DIAGNOSIS — Z794 Long term (current) use of insulin: Secondary | ICD-10-CM

## 2017-02-11 DIAGNOSIS — G4733 Obstructive sleep apnea (adult) (pediatric): Secondary | ICD-10-CM

## 2017-02-11 DIAGNOSIS — I10 Essential (primary) hypertension: Secondary | ICD-10-CM

## 2017-02-11 DIAGNOSIS — E039 Hypothyroidism, unspecified: Secondary | ICD-10-CM

## 2017-02-11 DIAGNOSIS — E118 Type 2 diabetes mellitus with unspecified complications: Secondary | ICD-10-CM

## 2017-02-11 DIAGNOSIS — I5032 Chronic diastolic (congestive) heart failure: Secondary | ICD-10-CM

## 2017-02-11 MED ORDER — SPIRONOLACTONE 25 MG PO TABS: ORAL_TABLET | ORAL | 6 refills | Status: DC

## 2017-02-11 NOTE — Telephone Encounter (Signed)
Pt. Called requesting her lab results. Please f/u with pt.  °

## 2017-02-11 NOTE — Progress Notes (Signed)
Cardiology Office Note    Date:  02/18/2017   ID:  Anita James, DOB 1951-08-02, MRN [id number]  PCP:  Maren Reamer, MD  Cardiologist:  Shelva Majestic, MD   Chief Complaint  Patient presents with  . New Patient (Initial Visit)   Referred by Dr. Clide Dales for cardiology consultation for shortness of breath and diastolic  heart failure. History of Present Illness:  Anita James is a 66 y.o. female who is referred by Dr. Janne Napoleon for cardiology consultation for increasing episodes of shortness of breath and possible heart failure.  [name] has a history of hypertension, diabetes mellitus, hypothyroidism, obstructive sleep apnea for which she's on CPAP therapy, and hyperlipidemia.  She is been cared for by Dr. Janne Napoleon at the [facility], [place].  She has had issues in the past with atypical chest pain.  Reportedly in 2013.  Ejection fraction was 55-60%.  In June 2017.  An echo Doppler study was done which showed an EF of 65-70% with mild LVH and grade 1 diastolic dysfunction.  There was mitral annular calcification.  No significant additional valvular abnormalities.  She has experienced some episodic left turning and sharp chest pain and has had several emergency room evaluations.  She has also been evaluated at [facility].  Recently, an ultrasound of her abdomen showed hepatic steatosis.  Recently, she has noticed mild shortness of breath particularly with step climbing.  She admits to a 60 pound weight gain over the past year.  She denies any exertional precipitation of chest tightness.  She denies PND, orthopnea.  She was recently seen by Dr. Jearld Fenton cardiology consultation was recommended.  She presents for evaluation.   Past Medical History:  Diagnosis Date  . Anxiety   . Carpal tunnel syndrome 06/14/2014  . Chest pain   . CHF (congestive heart failure) ([place])   . Chronic back pain   . Chronic kidney disease 03/2013   failure due to meds  . Diabetes mellitus type II, uncontrolled  ([place])   . Diverticulitis   . Frequent headaches   . Hiatal hernia   . Hyperlipemia   . Hypertension   . Hypothyroidism   . 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 Neuropathy   . Obesity   . Pneumonia   . Vitamin D deficiency 06/05/2014    Past Surgical History:  Procedure Laterality Date  . CESAREAN SECTION    . KNEE SURGERY Left   . THYROID SURGERY     goiter    Current Medications: Outpatient Medications Prior to Visit  Medication Sig Dispense Refill  . aspirin EC 81 MG tablet Take 81 mg by mouth daily.    Marland Kitchen atorvastatin (LIPITOR) 40 MG tablet Take 1 tablet (40 mg total) by mouth daily at 6 PM. 90 tablet 3  . Blood Glucose Monitoring Suppl (TRUE METRIX METER) w/Device KIT Use as instructed 1 kit 0  . Ca Carbonate-Mag Hydroxide 550-110 MG CHEW Chew 1 tablet by mouth 2 (two) times daily.    . Cetirizine HCl 10 MG CAPS Take 1 capsule (10 mg total) by mouth daily. 30 capsule 0  . dexlansoprazole (DEXILANT) 60 MG capsule Take 1 capsule (60 mg total) by mouth daily. 30 capsule 3  . furosemide (LASIX) 20 MG tablet TAKE 1 TABLET BY MOUTH DAILY X 5 DAYS THEN STOP. GET DRY WEIGHT AFTER DONE - IF +2 LBS MORE THAN BASE AS NEEDED, TAKE FOR 2 DAYS THEN STOP. 30 tablet 0  . gabapentin (NEURONTIN) 300 MG capsule  Take 2 capsules (600 mg total) by mouth 2 (two) times daily. 120 capsule 3  . glucose blood (TRUE METRIX BLOOD GLUCOSE TEST) test strip Check glucose twice a day 100 each 12  . insulin aspart (NOVOLOG) 100 UNIT/ML FlexPen Inject 30 Units into the skin 3 (three) times daily with meals. 30 mL 3  . Insulin Glargine (LANTUS SOLOSTAR) 100 UNIT/ML Solostar Pen Inject 70 Units into the skin daily at 10 pm. 5 pen 5  . levothyroxine (SYNTHROID, LEVOTHROID) 88 MCG tablet Take 1 tablet (88 mcg total) by mouth daily. 90 tablet 3  . liraglutide 18 MG/3ML SOPN 1.8 mg daily 18 mL 3  . lisinopril (PRINIVIL,ZESTRIL) 40 MG tablet Take 1 tablet (40 mg total) by mouth daily. 90 tablet 3  . metoprolol  (TOPROL-XL) 200 MG 24 hr tablet Take 1 tablet (200 mg total) by mouth daily. 90 tablet 3  . mometasone (NASONEX) 50 MCG/ACT nasal spray Place 2 sprays into the nose daily. 17 g 3  . Multiple Vitamins-Minerals (EYE VITAMINS) CAPS Take 1 capsule by mouth daily.    . NYSTATIN powder APPLY TOPICALLY 4 TIMES DAILY. 15 g 0  . ranitidine (ZANTAC) 150 MG tablet Take 150 mg by mouth daily.  0  . acyclovir ointment (ZOVIRAX) 5 % Apply 1 application topically every 4 (four) hours. 30 g 3  . atorvastatin (LIPITOR) 40 MG tablet Take 40 mg by mouth daily.    . celecoxib (CELEBREX) 50 MG capsule Take 1 capsule (50 mg total) by mouth 2 (two) times daily as needed for pain. 30 capsule 0  . Cranberry 500 MG CAPS Take 500 mg by mouth daily.    . cyclobenzaprine (FLEXERIL) 5 MG tablet Take 1 tablet (5 mg total) by mouth 3 (three) times daily as needed for muscle spasms. 90 tablet 1  . diclofenac sodium (VOLTAREN) 1 % GEL Apply 4 g topically 4 (four) times daily as needed (Pain). 100 g 0  . fluconazole (DIFLUCAN) 150 MG tablet TAKE 1 TABLET BY MOUTH DAILY. 1 tablet 0  . guaiFENesin (MUCINEX) 600 MG 12 hr tablet Take 1 tablet (600 mg total) by mouth 2 (two) times daily as needed. 60 tablet 1  . insulin aspart (NOVOLOG) 100 UNIT/ML injection Inject 20 Units into the skin 3 (three) times daily.    Marland Kitchen loratadine (CLARITIN) 10 MG tablet Take 1 tablet (10 mg total) by mouth daily. 30 tablet 11  . traMADol (ULTRAM) 50 MG tablet Take 50 mg by mouth every 6 (six) hours as needed.  0   No facility-administered medications prior to visit.      Allergies:   Amoxicillin-pot clavulanate; Hydrocodone-acetaminophen; Oxycodone; Sulfa antibiotics; Azithromycin; Norvasc [amlodipine besylate]; Sulfamethoxazole; and Tramadol   Social History   Social History  . Marital status: Divorced    Spouse name: N/A  . Number of children: 6  . Years of education: BA   Occupational History  . unemployed    Social History Main Topics  .  Smoking status: Never Smoker  . Smokeless tobacco: Never Used  . Alcohol use No  . Drug use: No  . Sexual activity: No   Other Topics Concern  . None   Social History Narrative  . None    Additional slow, so history is notable that she is divorced for 31 years.  She has 6 children and 10 grandchildren.  She works in [place] for [employer].  There is no tobacco history.  She does not drink alcohol.  She  does not exercise.  Family History:  The patient's family history includes COPD in her sister; Cancer in her father and mother; Diabetes in her father and sister; Heart disease in her father, sister, and sister.  Her mother died at age 29 with cancer.  Her father died and had hypertension and cancer.  She has 4 sisters who are deceased and one sister who is living.   ROS General: Negative; No fevers, chills, or night sweats; Positive for weight gain HEENT: Negative; No changes in vision or hearing, sinus congestion, difficulty swallowing Pulmonary: Negative; No cough, wheezing, shortness of breath, hemoptysis Cardiovascular: see HPI GI: Negative; positive for GERD GU: Negative; No dysuria, hematuria, or difficulty voiding Musculoskeletal: Negative; no myalgias, joint pain, or weakness Hematologic/Oncology: Negative; no easy bruising, bleeding Endocrine: Positive for diabetes mellitus on insulin, and hypothyroidism Neuro: Positive for peripheral neuropathy Skin: Negative; No rashes or skin lesions Psychiatric:Positive for anxiety Sleep: Positive for OSA, no daytime sleepiness, hypersomnolence, bruxism, restless legs, hypnogognic hallucinations, no cataplexy Other comprehensive 14 point system review is negative.   PHYSICAL EXAM:   VS:  BP (!) 151/89   Pulse 88   Ht _0  (1.676 m)   Wt 276 lb (125.2 kg)   BMI 44.55 kg/m     Repeat blood pressure by me was 144/90  Wt Readings from Last 3 Encounters:  02/11/17 276 lb (125.2 kg)  01/18/17 276 lb (125.2 kg)  01/07/17 269 lb  12.8 oz (122.4 kg)    General: Alert, oriented, no distress.  Skin: normal turgor, no rashes, warm and dry HEENT: Normocephalic, atraumatic. Pupils equal round and reactive to light; sclera anicteric; extraocular muscles intact; Fundi probable Nose without nasal septal hypertrophy Mouth/Parynx:  Tongue ring; Mallinpatti scale 4 Neck: No JVD, no carotid bruits; normal carotid upstroke Lungs: clear to ausculatation and percussion; no wheezing or rales Chest wall: without tenderness to palpitation Heart: PMI not displaced, RRR, s1 s2 normal, 1/6 systolic murmur, no diastolic murmur, no S3 gallop no rubs, gallops, thrills, or heaves Abdomen: Central adiposity ;soft, nontender; no hepatosplenomehaly, BS+; abdominal aorta nontender and not dilated by palpation. Back: no CVA tenderness Pulses 2+ Musculoskeletal: full range of motion, normal strength, no joint deformities Extremities: Trace ankle edema bilaterally no clubbing cyanosis , Homan's sign negative  Neurologic: grossly nonfocal; Cranial nerves grossly wnl Psychologic: Normal mood and affect   Studies/Labs Reviewed:   EKG:  EKG is ordered today.  ECG (independently read by me):Normal sinus rhythm at 88 bpm.  No ectopy.  QTc interval normal.  Recent Labs: BMP Latest Ref Rng & Units 12/28/2016 11/25/2016 08/17/2016  Glucose 65 - 99 mg/dL 64(L) 191(H) 245(H)  BUN 8 - 27 mg/dL _1 Creatinine 0.57 - 1.00 mg/dL 0.82 0.97 0.69  BUN/Creat Ratio 12 - 28 12 - -  Sodium 134 - 144 mmol/L 146(H) 141 139  Potassium 3.5 - 5.2 mmol/L 3.9 5.0 3.7  Chloride 96 - 106 mmol/L 103 105 103  CO2 18 - 29 mmol/L _2 Calcium 8.7 - 10.3 mg/dL 9.6 9.2 8.9     Hepatic Function Latest Ref Rng & Units 01/13/2016 12/23/2013 06/21/2013  Total Protein 6.1 - 8.1 g/dL 6.5 7.1 7.1  Albumin 3.6 - 5.1 g/dL 4.1 3.5 3.7  AST 10 - 35 U/L 22 18 33  ALT 6 - 29 U/L _3 Alk Phosphatase 33 - 130 U/L 60 56 69  Total Bilirubin 0.2 - 1.2 mg/dL 0.4 0.3 0.5  CBC Latest Ref Rng & Units 01/18/2017 12/23/2013 06/21/2013  WBC 3.4 - 10.8 x10E3/uL 7.5 5.3 5.4  Hemoglobin 12.0 - 15.0 g/dL - 14.2 13.5  Hematocrit 34.0 - 46.6 % 36.3 40.4 37.9  Platelets 150 - 379 x10E3/uL 221 189 209   Lab Results  Component Value Date   MCV 91 01/18/2017   MCV 87.1 12/23/2013   MCV 84 06/21/2013   Lab Results  Component Value Date   TSH 0.928 12/28/2016   Lab Results  Component Value Date   HGBA1C 6.0 12/28/2016     BNP    Component Value Date/Time   BNP 9.6 12/28/2016 1222   BNP 10.1 11/25/2016 1115    ProBNP    Component Value Date/Time   PROBNP CANCELED 03/09/2016 1703     Lipid Panel     Component Value Date/Time   CHOL 110 11/25/2016 1115   TRIG 169 (H) 11/25/2016 1115   HDL 36 (L) 11/25/2016 1115   CHOLHDL 3.1 11/25/2016 1115   VLDL 34 (H) 11/25/2016 1115   LDLCALC 40 11/25/2016 1115     RADIOLOGY: No results found.   Additional studies/ records that were reviewed today include:  I reviewed prior ER records, laboratory, prior echo Doppler study, recent ultrasound at [facility]:    1. Essential hypertension   2. Chronic diastolic congestive heart failure ([place])   3. Type 2 diabetes mellitus with complication, with long-term current use of insulin ([place])   4. Morbid obesity ([place])   5. Hyperlipidemia with target LDL less than 70   6. OSA (obstructive sleep apnea)   7. Hypothyroidism, unspecified type      PLAN:  Ms. Maleeya Peterkin is a 66 year old Caucasian female who has a history of morbid obesity, hypertension, type 2 diabetes mellitus on insulin, hypothyroidism, hyperlipidemia, and obstructive sleep apnea on CPAP therapy.  She also has been found to have hepatic steatosis.  Remotely, she has been found to have normal systolic function and is felt to have chronic diastolic dysfunction.  She admits to shortness of breath with walking particularly step climbing.  She denies any exertional chest tightness.  She  has noticed some atypical sharp and shooting chest pains.  Remotely in the past, which I do not believe is of ischemic etiology.  Her blood pressure today is elevated despite being on furosemide, lisinopril 40 mg daily, Toprol-XL 200 mg daily.  With her diastolic dysfunction, I am recommending the addition of spironolactone and she will initiate this at 12.5 mg for the first week and then increase to 12.5 mg twice a day.  She is on Ace inhibition, and beta blocker therapy, and when necessary diuretic.  Over 60 pounds according to her report over the past year.  Weight reduction was strongly recommended.  This undoubtedly has also contributed to sleep apnea and diabetes.  I have recommended a follow-up echo Doppler study in one month to reassess systolic and diastolic function, valvular architecture, and pulmonary pressures.  On exam today, she has trace ankle edema and has been taking Lasix on an as-needed basis.  She has a history of hypothyroidism on levothyroxine 88 g.  She is diabetic on insulin.  She has GERD which has been controlled with excellent and she also takes ranitidine.  She has a history of hyperlipidemia with target LDL less than 70 in this diabetic female.  On exam, she was found to have a probable right eye cataract which he was unaware of.  Follow-up laboratory  will be obtained.  I will see her in 2 months for reevaluation.   Medication Adjustments/Labs and Tests Ordered: Current medicines are reviewed at length with the patient today.  Concerns regarding medicines are outlined above.  Medication changes, Labs and Tests ordered today are listed in the Patient Instructions below. Patient Instructions  Medication Instructions:   Start new prescription given today for spironolactone. Take as directed on the bottle. This has been sent to your pharmacy.  Labwork:  B-MET 2 weeks here in our office. You will not need to fast.  Testing/Procedures:  Your physician has requested that you  have an echocardiogram IN 1 MONTH. Echocardiography is a painless test that uses sound waves to create images of your heart. It provides your doctor with information about the size and shape of your heart and how well your heart's chambers and valves are working. This procedure takes approximately one hour. There are no restrictions for this procedure. THIS WILL BE DONE AT THE [facility] [facility],    Follow-Up:  2 months  Any Other Special Instructions Will Be Listed Below (If Applicable).      Signed, Shelva Majestic, MD  02/18/2017 9:04 AM    [facility] [address], [place], [place], [place]  [address] Phone: [phone]

## 2017-02-11 NOTE — Patient Instructions (Signed)
Medication Instructions:   Start new prescription given today for spironolactone. Take as directed on the bottle. This has been sent to your pharmacy.  Labwork:  B-MET 2 weeks here in our office. You will not need to fast.  Testing/Procedures:  Your physician has requested that you have an echocardiogram IN 1 MONTH. Echocardiography is a painless test that uses sound waves to create images of your heart. It provides your doctor with information about the size and shape of your heart and how well your heart's chambers and valves are working. This procedure takes approximately one hour. There are no restrictions for this procedure. THIS WILL BE DONE AT THE [facility] [facility],    Follow-Up:  2 months  Any Other Special Instructions Will Be Listed Below (If Applicable).

## 2017-02-12 MED FILL — ?SPIRONOLACTONE 25 MG TABLE: 25 | 30 days supply | Qty: 30 | Fill #0

## 2017-02-18 MED FILL — LISINOPRIL 40 MG TABLET: 40 | 30 days supply | Qty: 30 | Fill #1

## 2017-02-22 ENCOUNTER — Other Ambulatory Visit: Payer: Self-pay | Admitting: Pharmacist

## 2017-02-22 MED ORDER — MOMETASONE FUROATE 50 MCG/ACT NA SUSP
2.0000 | Freq: Every day | NASAL | 3 refills | Status: DC
Start: 2017-02-22 — End: 2017-06-02

## 2017-02-24 ENCOUNTER — Telehealth: Payer: Self-pay | Admitting: *Deleted

## 2017-02-24 DIAGNOSIS — I10 Essential (primary) hypertension: Secondary | ICD-10-CM | POA: Diagnosis not present

## 2017-02-24 NOTE — Telephone Encounter (Signed)
Patient had lab work completeted today in office-received question for nurse regarding medication.   Patient states she has been taking 1/2 tablet for 7 days and was wondering if she needed to increase to 1/2 tablet BID.   Per Rx patient was suppose to start spironolactone on 5/24-1/2 tablet x 7 days then increase to 1/2 tablet BID.  Patient has only taken 7 days-had blood work today.  Advised to continue 1/2 tablet and will route to Dr. Claiborne Billings and primary nurse to address once labs result.  Patient verbalized understanding.

## 2017-02-25 LAB — BASIC METABOLIC PANEL
BUN / CREAT RATIO: 16 (ref 12–28)
BUN: 13 mg/dL (ref 8–27)
CO2: 29 mmol/L (ref 18–29)
CREATININE: 0.82 mg/dL (ref 0.57–1.00)
Calcium: 9.5 mg/dL (ref 8.7–10.3)
Chloride: 104 mmol/L (ref 96–106)
GFR calc Af Amer: 88 mL/min/{1.73_m2} (ref >59)
GFR calc non Af Amer: 76 mL/min/{1.73_m2} (ref >59)
GLUCOSE: 69 mg/dL (ref 65–99)
POTASSIUM: 4.5 mmol/L (ref 3.5–5.2)
SODIUM: 147 mmol/L — AB (ref 134–144)

## 2017-02-26 NOTE — Telephone Encounter (Signed)
Deferred to Dr Claiborne Billings for recommendations.

## 2017-03-01 ENCOUNTER — Encounter: Payer: Self-pay | Admitting: Internal Medicine

## 2017-03-01 ENCOUNTER — Ambulatory Visit: Payer: Self-pay | Attending: Internal Medicine | Admitting: Internal Medicine

## 2017-03-01 VITALS — BP 138/79 | HR 89 | Temp 98.1°F | Resp 16 | Wt 279.8 lb

## 2017-03-01 DIAGNOSIS — M5412 Radiculopathy, cervical region: Secondary | ICD-10-CM | POA: Insufficient documentation

## 2017-03-01 DIAGNOSIS — Z7982 Long term (current) use of aspirin: Secondary | ICD-10-CM | POA: Insufficient documentation

## 2017-03-01 DIAGNOSIS — M79674 Pain in right toe(s): Secondary | ICD-10-CM

## 2017-03-01 DIAGNOSIS — Z880 Allergy status to penicillin: Secondary | ICD-10-CM | POA: Insufficient documentation

## 2017-03-01 DIAGNOSIS — R0602 Shortness of breath: Secondary | ICD-10-CM | POA: Insufficient documentation

## 2017-03-01 DIAGNOSIS — Z9989 Dependence on other enabling machines and devices: Secondary | ICD-10-CM

## 2017-03-01 DIAGNOSIS — I110 Hypertensive heart disease with heart failure: Secondary | ICD-10-CM | POA: Insufficient documentation

## 2017-03-01 DIAGNOSIS — I5032 Chronic diastolic (congestive) heart failure: Secondary | ICD-10-CM | POA: Insufficient documentation

## 2017-03-01 DIAGNOSIS — K219 Gastro-esophageal reflux disease without esophagitis: Secondary | ICD-10-CM | POA: Insufficient documentation

## 2017-03-01 DIAGNOSIS — J309 Allergic rhinitis, unspecified: Secondary | ICD-10-CM | POA: Insufficient documentation

## 2017-03-01 DIAGNOSIS — E1136 Type 2 diabetes mellitus with diabetic cataract: Secondary | ICD-10-CM | POA: Insufficient documentation

## 2017-03-01 DIAGNOSIS — G4733 Obstructive sleep apnea (adult) (pediatric): Secondary | ICD-10-CM

## 2017-03-01 DIAGNOSIS — E785 Hyperlipidemia, unspecified: Secondary | ICD-10-CM

## 2017-03-01 DIAGNOSIS — H269 Unspecified cataract: Secondary | ICD-10-CM

## 2017-03-01 DIAGNOSIS — Z794 Long term (current) use of insulin: Secondary | ICD-10-CM

## 2017-03-01 DIAGNOSIS — I10 Essential (primary) hypertension: Secondary | ICD-10-CM

## 2017-03-01 DIAGNOSIS — E039 Hypothyroidism, unspecified: Secondary | ICD-10-CM | POA: Insufficient documentation

## 2017-03-01 DIAGNOSIS — R5381 Other malaise: Secondary | ICD-10-CM

## 2017-03-01 DIAGNOSIS — E1140 Type 2 diabetes mellitus with diabetic neuropathy, unspecified: Secondary | ICD-10-CM

## 2017-03-01 DIAGNOSIS — E1165 Type 2 diabetes mellitus with hyperglycemia: Secondary | ICD-10-CM | POA: Insufficient documentation

## 2017-03-01 DIAGNOSIS — F329 Major depressive disorder, single episode, unspecified: Secondary | ICD-10-CM | POA: Insufficient documentation

## 2017-03-01 DIAGNOSIS — E118 Type 2 diabetes mellitus with unspecified complications: Secondary | ICD-10-CM

## 2017-03-01 DIAGNOSIS — I5030 Unspecified diastolic (congestive) heart failure: Secondary | ICD-10-CM

## 2017-03-01 LAB — GLUCOSE, POCT (MANUAL RESULT ENTRY): POC Glucose: 107 mg/dl — AB (ref 70–99)

## 2017-03-01 MED ORDER — LISINOPRIL 40 MG PO TABS: 40.0000 mg | ORAL_TABLET | Freq: Every day | ORAL | 3 refills | Status: DC

## 2017-03-01 MED ORDER — GABAPENTIN 300 MG PO CAPS: 600.0000 mg | ORAL_CAPSULE | Freq: Two times a day (BID) | ORAL | 6 refills | Status: DC

## 2017-03-01 MED ORDER — METOPROLOL SUCCINATE ER 200 MG PO TB24: 200.0000 mg | ORAL_TABLET | Freq: Every day | ORAL | 3 refills | Status: DC

## 2017-03-01 MED ORDER — LEVOTHYROXINE SODIUM 88 MCG PO TABS: 88.0000 ug | ORAL_TABLET | Freq: Every day | ORAL | 3 refills | Status: DC

## 2017-03-01 MED ORDER — ATORVASTATIN CALCIUM 40 MG PO TABS: 40.0000 mg | ORAL_TABLET | Freq: Every day | ORAL | 3 refills | Status: DC

## 2017-03-01 MED ORDER — INSULIN ASPART 100 UNIT/ML FLEXPEN: 30.0000 [IU] | PEN_INJECTOR | Freq: Three times a day (TID) | SUBCUTANEOUS | 6 refills | Status: DC

## 2017-03-01 MED ORDER — INSULIN GLARGINE 100 UNIT/ML SOLOSTAR PEN: 70.0000 [IU] | PEN_INJECTOR | Freq: Every day | SUBCUTANEOUS | 5 refills | Status: DC

## 2017-03-01 MED ORDER — FUROSEMIDE 20 MG PO TABS: ORAL_TABLET | ORAL | 0 refills | Status: DC

## 2017-03-01 MED FILL — METOPROLOL SUCC ER 200 MG T: 200 | 30 days supply | Qty: 30 | Fill #0

## 2017-03-01 MED FILL — ?FUROSEMIDE 20 MG TABLET: 20 | 30 days supply | Qty: 8 | Fill #0

## 2017-03-01 MED FILL — ATORVASTATIN 40 MG TABLET: 40 | 30 days supply | Qty: 30 | Fill #5

## 2017-03-01 MED FILL — GABAPENTIN 300 MG CAPSULE: 300 | 30 days supply | Qty: 120 | Fill #0

## 2017-03-01 NOTE — Progress Notes (Signed)
[name], female    DOB: Apr 05, 1951  MRN: [id number]  CC: Diabetes; Hypertension; and Re-establish   Subjective: Anita James is a 66 y.o. female who presents for chronic ds management Her concerns today include:  Patient with history of diabetes type 2, hypertension, hypothyroidism, obstructive sleep apnea on CPAP, morbid obesity, diastolic CHF with EF of 38-18%.  1. SOB on exertion -saw cardiologist Dr. Shelva Majestic since last visit .  With the diagnosis of diastolic CHF he recommended adding spironolactone to her current regimen. He also stressed the importance of weight loss -No difference in SOB or swelling with the addition of spironolactone -takes Furosemide PRN  2. Hypothyroidism -TSH was decreased to 88 mcg 2 mths ago.  Repeat TSH one month later  was in low normal range  3.  OSA CPAP -Does not care for the CPAP machine. "I did not rest well with it and it makes me depress because it doesn't fit right."  Took her machine to medical supply store.  They changed mask but now the new mask does not fit the existing to being on the machine. She has not had time to get back over there for them to match with a mass with the tubing -Current machine was a refurbished one that she got through [place]  DM: -check blood sugars 4 x a day. BS this a.m was 113 -no fried foods.  Trying to limit starches.  Bread is no weakness -compliant with Lantus, Novolog and Victoza..  Followed by DM specialist in [facility]. Has follow-up appointment Tomorrow  4.  Cataract Rt eye -see eye specialist tomorrow   Affects driving  5.  Obesity: Concern about her weight gain. States she is trying very hard with her diet. -She feels the decreased dose and levothyroxine may have contributed. Reports minimal weight change with Victoza Not getting in much exercise. Wants to try diet Contrave  Complain of pain of RT 5th toe 4 days.  She jammed it against an object 4 days ago. Tender to  touch  Want diet pill Patient Active Problem List   Diagnosis Date Noted  . Neuropathic pain 11/04/2016  . Carpal tunnel syndrome 06/14/2014  . 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 Disturbance of skin sensation 06/08/2014  . Cervical radiculopathy at C8 06/05/2014  . Hyperlipemia 09/12/2013  . Diastolic CHF ([place]) 40/37/5436  . Major depression 09/12/2013  . Allergic rhinitis 06/22/2013  . Chronic pain syndrome 05/31/2013  . Dysphagia 12/26/2012  . Gastroesophageal reflux disease without esophagitis 08/30/2012  . Essential hypertension   . Hypothyroidism   . Type 2 diabetes, uncontrolled, with neuropathy [facility])      Current Outpatient Prescriptions on File Prior to Visit  Medication Sig Dispense Refill  . aspirin EC 81 MG tablet Take 81 mg by mouth daily.    . Blood Glucose Monitoring Suppl (TRUE METRIX METER) w/Device KIT Use as instructed 1 kit 0  . Ca Carbonate-Mag Hydroxide 550-110 MG CHEW Chew 1 tablet by mouth 2 (two) times daily.    . Cetirizine HCl 10 MG CAPS Take 1 capsule (10 mg total) by mouth daily. 30 capsule 0  . dexlansoprazole (DEXILANT) 60 MG capsule Take 1 capsule (60 mg total) by mouth daily. 30 capsule 3  . glucose blood (TRUE METRIX BLOOD GLUCOSE TEST) test strip Check glucose twice a day 100 each 12  . liraglutide 18 MG/3ML SOPN 1.8 mg daily 18 mL 3  . mometasone (NASONEX) 50 MCG/ACT nasal spray  Place 2 sprays into the nose daily. 17 g 3  . Multiple Vitamins-Minerals (EYE VITAMINS) CAPS Take 1 capsule by mouth daily.    . NYSTATIN powder APPLY TOPICALLY 4 TIMES DAILY. 15 g 0  . ranitidine (ZANTAC) 150 MG tablet Take 150 mg by mouth daily.  0  . spironolactone (ALDACTONE) 25 MG tablet Take 1/2 tablet daily for 1 week then increase to 1/2 tablet twice a day 30 tablet 6  . [DISCONTINUED] fenofibrate (TRICOR) 145 MG tablet Take 1 tablet (145 mg total) by mouth daily. 30 tablet 2  . [DISCONTINUED] insulin detemir (LEVEMIR) 100 UNIT/ML injection Inject 60 Units into  the skin at bedtime. 10 mL 1  . [DISCONTINUED] metoprolol (LOPRESSOR) 50 MG tablet Take 1 tablet (50 mg total) by mouth 2 (two) times daily. 60 tablet 2   No current facility-administered medications on file prior to visit.     Allergies  Allergen Reactions  . Amoxicillin-Pot Clavulanate Nausea And Vomiting and Other (See Comments)    Combination of medications tear lining of stomach  . Hydrocodone-Acetaminophen Shortness Of Breath  . Oxycodone Shortness Of Breath    oxycontin too  . Sulfa Antibiotics Shortness Of Breath  . Azithromycin   . Norvasc [Amlodipine Besylate] Swelling    feet  . Sulfamethoxazole Nausea Only  . Tramadol Other (See Comments)    Unknown, toxicity. Patient advised she had to be given Narcan with this    Social History   Social History  . Marital status: Divorced    Spouse name: N/A  . Number of children: 6  . Years of education: BA   Occupational History  . unemployed    Social History Main Topics  . Smoking status: Never Smoker  . Smokeless tobacco: Never Used  . Alcohol use No  . Drug use: No  . Sexual activity: No   Other Topics Concern  . Not on file   Social History Narrative  . No narrative on file    Family History  Problem Relation Age of Onset  . Cancer Mother        cervical- mets  . Cancer Father   . Heart disease Father   . Diabetes Father   . Diabetes Sister   . Heart disease Sister   . Heart disease Sister   . COPD Sister     Past Surgical History:  Procedure Laterality Date  . CESAREAN SECTION    . KNEE SURGERY Left   . THYROID SURGERY     goiter    ROS: Review of Systems As stated above  PHYSICAL EXAM: BP 138/79   Pulse 89   Temp 98.1 F (36.7 C) (Oral)   Resp 16   Wt 279 lb 12.8 oz (126.9 kg)   SpO2 94%   BMI 45.16 kg/m   Wt Readings from Last 3 Encounters:  03/01/17 279 lb 12.8 oz (126.9 kg)  02/11/17 276 lb (125.2 kg)  01/18/17 276 lb (125.2 kg)   Physical Exam General appearance -  alert, well appearing, obese female and in no distress Mental status - alert, oriented to person, place, and time, normal mood, behavior, speech, dress, motor activity, and thought processes Mouth - mucous membranes moist, pharynx normal without lesions Neck - supple, no significant adenopathy Chest - clear to auscultation, no wheezes, rales or rhonchi, symmetric air entry Extremities - trace lower extremity edema MSK: Right fifth toe-no edema or erythema noted slight tenderness on palpation at the base of the toe. Good range  of motion  Depression screen PHQ 2/9 03/01/2017  Decreased Interest 1  Down, Depressed, Hopeless 1  PHQ - 2 Score 2  Altered sleeping 3  Tired, decreased energy 3  Change in appetite 1  Feeling bad or failure about yourself  3  Trouble concentrating (No Data)  Moving slowly or fidgety/restless 1  Suicidal thoughts 2  PHQ-9 Score 15  Difficult doing work/chores -  Some recent data might be hidden   GAD 7 : Generalized Anxiety Score 03/01/2017 01/18/2017 11/25/2016 11/12/2016  Nervous, Anxious, on Edge '1 2 3 3  ' Control/stop worrying '3 3 3 3  ' Worry too much - different things '2 3 3 3  ' Trouble relaxing '3 3 3 3  ' Restless '2 3 2 3  ' Easily annoyed or irritable '3 2 2 3  ' Afraid - awful might happen '2 2 3 3  ' Total GAD 7 Score '16 18 19 21      ' Results for orders placed or performed in visit on 03/01/17  POCT glucose (manual entry)  Result Value Ref Range   POC Glucose 107 (A) 70 - 99 mg/dl    ASSESSMENT AND PLAN: 1. OSA on CPAP -I recommend that she take the mask back to the medical supply store for them to back it up with the tubing. Encouraged use of the CPAP thereafter  2. Morbid obesity ([place]) -Discussed the importance of healthy eating habits and trying to get in some type of physical activity. She is agreeable to referral for physical therapy to help increase mobility so that she can learn what she is capable of on her limits - Ambulatory referral to Physical  Therapy  3. Type 2 diabetes, controlled, with long-term use of insulin (HCC) - POCT glucose (manual entry) - gabapentin (NEURONTIN) 300 MG capsule; Take 2 capsules (600 mg total) by mouth 2 (two) times daily.  Dispense: 120 capsule; Refill: 6 - insulin aspart (NOVOLOG) 100 UNIT/ML FlexPen; Inject 30 Units into the skin 3 (three) times daily with meals.  Dispense: 30 mL; Refill: 6  4. Diastolic congestive heart failure, unspecified HF chronicity (HCC) - furosemide (LASIX) 20 MG tablet; Take 1 tab PO 1-2 times a weeks as needed for lower extremity edema  Dispense: 30 tablet; Refill: 0  5. Cataract of right eye, unspecified cataract type Keep appointment with ophthalmology tomorrow  6. Toe pain, right -Having this is most likely a sprain. However x-ray was offered but patient declined  7. Physical deconditioning - Ambulatory referral to Physical Therapy  8. Type 2 diabetes, controlled, with neuropathy (HCC) - gabapentin (NEURONTIN) 300 MG capsule; Take 2 capsules (600 mg total) by mouth 2 (two) times daily.  Dispense: 120 capsule; Refill: 6  9. Essential hypertension - lisinopril (PRINIVIL,ZESTRIL) 40 MG tablet; Take 1 tablet (40 mg total) by mouth daily.  Dispense: 90 tablet; Refill: 3 - metoprolol (TOPROL-XL) 200 MG 24 hr tablet; Take 1 tablet (200 mg total) by mouth daily.  Dispense: 90 tablet; Refill: 3  10. Hyperlipidemia, unspecified hyperlipidemia type - atorvastatin (LIPITOR) 40 MG tablet; Take 1 tablet (40 mg total) by mouth daily at 6 PM.  Dispense: 90 tablet; Refill: 3  Patient was given the opportunity to ask questions.  Patient verbalized understanding of the plan and was able to repeat key elements of the plan.   Orders Placed This Encounter  Procedures  . Ambulatory referral to Physical Therapy  . POCT glucose (manual entry)     Requested Prescriptions   Signed Prescriptions Disp Refills  .  gabapentin (NEURONTIN) 300 MG capsule 120 capsule 6    Sig: Take 2  capsules (600 mg total) by mouth 2 (two) times daily.  Marland Kitchen lisinopril (PRINIVIL,ZESTRIL) 40 MG tablet 90 tablet 3    Sig: Take 1 tablet (40 mg total) by mouth daily.  . metoprolol (TOPROL-XL) 200 MG 24 hr tablet 90 tablet 3    Sig: Take 1 tablet (200 mg total) by mouth daily.  Marland Kitchen atorvastatin (LIPITOR) 40 MG tablet 90 tablet 3    Sig: Take 1 tablet (40 mg total) by mouth daily at 6 PM.  . furosemide (LASIX) 20 MG tablet 30 tablet 0    Sig: Take 1 tab PO 1-2 times a weeks as needed for lower extremity edema  . levothyroxine (SYNTHROID, LEVOTHROID) 88 MCG tablet 90 tablet 3    Sig: Take 1 tablet (88 mcg total) by mouth daily.  . insulin aspart (NOVOLOG) 100 UNIT/ML FlexPen 30 mL 6    Sig: Inject 30 Units into the skin 3 (three) times daily with meals.  . Insulin Glargine (LANTUS SOLOSTAR) 100 UNIT/ML Solostar Pen 5 pen 5    Sig: Inject 70 Units into the skin daily at 10 pm.    Return in about 3 months (around 06/01/2017).  Karle Plumber, MD, FACP

## 2017-03-01 NOTE — Patient Instructions (Signed)
You can purchase and use Xenical over the counter.  Take 3 times a day with meals.  You MUST take a multi-vitamin like One A Day Women's Vitamin daily while taking Xenical.   Speak with your endocrinologist tomorrow about Contrave. I have referred you to physical therapy.  Try to get the proper tubing for your CPAP machine.

## 2017-03-02 ENCOUNTER — Ambulatory Visit: Payer: No Typology Code available for payment source | Admitting: Internal Medicine

## 2017-03-05 ENCOUNTER — Encounter: Payer: Self-pay | Admitting: Internal Medicine

## 2017-03-08 ENCOUNTER — Ambulatory Visit (HOSPITAL_COMMUNITY): Payer: Medicaid Other | Attending: Cardiology

## 2017-03-08 ENCOUNTER — Other Ambulatory Visit: Payer: Self-pay

## 2017-03-08 DIAGNOSIS — E785 Hyperlipidemia, unspecified: Secondary | ICD-10-CM | POA: Diagnosis not present

## 2017-03-08 DIAGNOSIS — Z6841 Body Mass Index (BMI) 40.0 and over, adult: Secondary | ICD-10-CM | POA: Insufficient documentation

## 2017-03-08 DIAGNOSIS — I110 Hypertensive heart disease with heart failure: Secondary | ICD-10-CM | POA: Diagnosis not present

## 2017-03-08 DIAGNOSIS — E119 Type 2 diabetes mellitus without complications: Secondary | ICD-10-CM | POA: Diagnosis not present

## 2017-03-08 DIAGNOSIS — I5032 Chronic diastolic (congestive) heart failure: Secondary | ICD-10-CM | POA: Insufficient documentation

## 2017-03-08 DIAGNOSIS — I10 Essential (primary) hypertension: Secondary | ICD-10-CM

## 2017-03-09 ENCOUNTER — Telehealth: Payer: Self-pay | Admitting: Cardiovascular Disease

## 2017-03-09 NOTE — Telephone Encounter (Signed)
New message ° ° ° ° °Pt is returning call about lab work. °

## 2017-03-10 NOTE — Telephone Encounter (Signed)
Left message for patient that lab work was normal. Copy placed in the mail to the p[atient.

## 2017-03-11 NOTE — Telephone Encounter (Signed)
Creatinine and potassium were normal.  On follow-up be met.  Okay to increase spironolactone to one half tablets twice a day

## 2017-03-12 NOTE — Telephone Encounter (Signed)
Left message to call back  

## 2017-03-17 ENCOUNTER — Ambulatory Visit: Payer: Self-pay | Attending: Internal Medicine | Admitting: Internal Medicine

## 2017-03-17 ENCOUNTER — Ambulatory Visit (HOSPITAL_COMMUNITY)
Admission: RE | Admit: 2017-03-17 | Discharge: 2017-03-17 | Disposition: A | Discharge status: Home / Self Care | Payer: Medicaid Other | Source: Ambulatory Visit | Attending: Internal Medicine | Admitting: Internal Medicine

## 2017-03-17 ENCOUNTER — Encounter: Payer: Self-pay | Admitting: Internal Medicine

## 2017-03-17 VITALS — BP 142/81 | HR 92 | Temp 98.2°F | Resp 18 | Ht 66.0 in | Wt 283.6 lb

## 2017-03-17 DIAGNOSIS — M549 Dorsalgia, unspecified: Secondary | ICD-10-CM | POA: Insufficient documentation

## 2017-03-17 DIAGNOSIS — I739 Peripheral vascular disease, unspecified: Secondary | ICD-10-CM | POA: Insufficient documentation

## 2017-03-17 DIAGNOSIS — W1840XA Slipping, tripping and stumbling without falling, unspecified, initial encounter: Secondary | ICD-10-CM | POA: Insufficient documentation

## 2017-03-17 DIAGNOSIS — Z7982 Long term (current) use of aspirin: Secondary | ICD-10-CM | POA: Insufficient documentation

## 2017-03-17 DIAGNOSIS — F419 Anxiety disorder, unspecified: Secondary | ICD-10-CM | POA: Insufficient documentation

## 2017-03-17 DIAGNOSIS — K449 Diaphragmatic hernia without obstruction or gangrene: Secondary | ICD-10-CM | POA: Insufficient documentation

## 2017-03-17 DIAGNOSIS — S92511A Displaced fracture of proximal phalanx of right lesser toe(s), initial encounter for closed fracture: Secondary | ICD-10-CM | POA: Insufficient documentation

## 2017-03-17 DIAGNOSIS — S93491A Sprain of other ligament of right ankle, initial encounter: Secondary | ICD-10-CM | POA: Diagnosis present

## 2017-03-17 DIAGNOSIS — E1165 Type 2 diabetes mellitus with hyperglycemia: Secondary | ICD-10-CM | POA: Insufficient documentation

## 2017-03-17 DIAGNOSIS — E785 Hyperlipidemia, unspecified: Secondary | ICD-10-CM | POA: Insufficient documentation

## 2017-03-17 DIAGNOSIS — S93401A Sprain of unspecified ligament of right ankle, initial encounter: Secondary | ICD-10-CM | POA: Insufficient documentation

## 2017-03-17 DIAGNOSIS — G8929 Other chronic pain: Secondary | ICD-10-CM | POA: Insufficient documentation

## 2017-03-17 DIAGNOSIS — X58XXXA Exposure to other specified factors, initial encounter: Secondary | ICD-10-CM | POA: Diagnosis not present

## 2017-03-17 DIAGNOSIS — E1122 Type 2 diabetes mellitus with diabetic chronic kidney disease: Secondary | ICD-10-CM | POA: Insufficient documentation

## 2017-03-17 DIAGNOSIS — M19071 Primary osteoarthritis, right ankle and foot: Secondary | ICD-10-CM | POA: Diagnosis not present

## 2017-03-17 DIAGNOSIS — Z794 Long term (current) use of insulin: Secondary | ICD-10-CM | POA: Insufficient documentation

## 2017-03-17 DIAGNOSIS — M7989 Other specified soft tissue disorders: Secondary | ICD-10-CM | POA: Insufficient documentation

## 2017-03-17 DIAGNOSIS — E559 Vitamin D deficiency, unspecified: Secondary | ICD-10-CM | POA: Insufficient documentation

## 2017-03-17 DIAGNOSIS — I10 Essential (primary) hypertension: Secondary | ICD-10-CM

## 2017-03-17 DIAGNOSIS — E039 Hypothyroidism, unspecified: Secondary | ICD-10-CM | POA: Insufficient documentation

## 2017-03-17 DIAGNOSIS — M7731 Calcaneal spur, right foot: Secondary | ICD-10-CM | POA: Insufficient documentation

## 2017-03-17 DIAGNOSIS — Z880 Allergy status to penicillin: Secondary | ICD-10-CM | POA: Insufficient documentation

## 2017-03-17 DIAGNOSIS — N189 Chronic kidney disease, unspecified: Secondary | ICD-10-CM | POA: Insufficient documentation

## 2017-03-17 DIAGNOSIS — E1140 Type 2 diabetes mellitus with diabetic neuropathy, unspecified: Secondary | ICD-10-CM | POA: Insufficient documentation

## 2017-03-17 DIAGNOSIS — I130 Hypertensive heart and chronic kidney disease with heart failure and stage 1 through stage 4 chronic kidney disease, or unspecified chronic kidney disease: Secondary | ICD-10-CM | POA: Insufficient documentation

## 2017-03-17 DIAGNOSIS — I509 Heart failure, unspecified: Secondary | ICD-10-CM | POA: Insufficient documentation

## 2017-03-17 MED ORDER — NALTREXONE-BUPROPION HCL ER 8-90 MG PO TB12: ORAL_TABLET | ORAL | 3 refills | Status: DC

## 2017-03-17 MED FILL — LISINOPRIL 40 MG TABLET: 40 | 30 days supply | Qty: 30 | Fill #0

## 2017-03-17 NOTE — Patient Instructions (Signed)
Ankle Sprain  An ankle sprain is a stretch or tear in one of the tough tissues (ligaments) in your ankle.  Follow these instructions at home:   Rest your ankle.   Take over-the-counter and prescription medicines only as told by your doctor.   For 2-3 days, keep your ankle higher than the level of your heart (elevated) as much as possible.   If directed, put ice on the area:  ? Put ice in a plastic bag.  ? Place a towel between your skin and the bag.  ? Leave the ice on for 20 minutes, 2-3 times a day.   If you were given a brace:  ? Wear it as told.  ? Take it off to shower or bathe.  ? Try not to move your ankle much, but wiggle your toes from time to time. This helps to prevent swelling.   If you were given an elastic bandage (dressing):  ? Take it off when you shower or bathe.  ? Try not to move your ankle much, but wiggle your toes from time to time. This helps to prevent swelling.  ? Adjust the bandage to make it more comfortable if it feels too tight.  ? Loosen the bandage if you lose feeling in your foot, your foot tingles, or your foot gets cold and blue.   If you have crutches, use them as told by your doctor. Continue to use them until you can walk without feeling pain in your ankle.  Contact a doctor if:   Your bruises or swelling are quickly getting worse.   Your pain does not get better after you take medicine.  Get help right away if:   You cannot feel your toes or foot.   Your toes or your foot looks blue.   You have very bad pain that gets worse.  This information is not intended to replace advice given to you by your health care provider. Make sure you discuss any questions you have with your health care provider.  Document Released: 02/24/2008 Document Revised: 02/13/2016 Document Reviewed: 04/09/2015  Elsevier Interactive Patient Education  2018 Elsevier Inc[employer]

## 2017-03-17 NOTE — Progress Notes (Signed)
Anita James, is a 66 y.o. female  LNL:[id number]  EYC:[id number]  DOB - [birth year]  No chief complaint on file.      Subjective:   Anita James is a 66 y.o. female who presents today for an urgent care visit. Patient tripped and sprained her right ankle and injured her right big toe with persistent pain and swelling since the accident 3 weeks ago. Patient is also requesting medication to assist her in losing weight, she claimed she has tried everything, and now with her injury, its just going to make exercise more difficult and "I can't stand this excessive weight I carry around". [name], [name], [name], [name], [name]  Problem  Sprain of Right Ankle  Morbid Obesity Due to Excess Calories (Hcc)    ALLERGIES: Allergies  Allergen Reactions  . Amoxicillin-Pot Clavulanate Nausea And Vomiting and Other (See Comments)    Combination of medications tear lining of stomach  . Hydrocodone-Acetaminophen Shortness Of Breath  . Oxycodone Shortness Of Breath    oxycontin too  . Sulfa Antibiotics Shortness Of Breath  . Azithromycin   . Norvasc [Amlodipine Besylate] Swelling    feet  . Sulfamethoxazole Nausea Only  . Tramadol Other (See Comments)    Unknown, toxicity. Patient advised she had to be given Narcan with this    PAST MEDICAL HISTORY: Past Medical History:  Diagnosis Date  . Anxiety   . Carpal tunnel syndrome 06/14/2014  . Chest pain   . CHF (congestive heart failure) ([place])   . Chronic back pain   . Chronic kidney disease 03/2013   failure due to meds  . Diabetes mellitus type II, uncontrolled ([place])   . Diverticulitis   . Frequent headaches   . Hiatal hernia   . Hyperlipemia   . Hypertension   . Hypothyroidism   . 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 Neuropathy   . Obesity   . Pneumonia   . Vitamin D deficiency 06/05/2014    MEDICATIONS AT HOME: Prior to Admission medications     Medication Sig Start Date End Date Taking? Authorizing Provider  aspirin EC 81 MG tablet Take 81 mg by mouth daily. 11/20/16   [provider]  atorvastatin (LIPITOR) 40 MG tablet Take 1 tablet (40 mg total) by mouth daily at 6 PM. 03/01/17   Ladell Pier, MD  Blood Glucose Monitoring Suppl (TRUE METRIX METER) w/Device KIT Use as instructed 09/08/16   Tresa Garter, MD  Ca Carbonate-Mag Hydroxide 550-110 MG CHEW Chew 1 tablet by mouth 2 (two) times daily. 11/20/16   [provider]  Cetirizine HCl 10 MG CAPS Take 1 capsule (10 mg total) by mouth daily. 05/28/16   Maren Reamer, MD  dexlansoprazole (DEXILANT) 60 MG capsule Take 1 capsule (60 mg total) by mouth daily. 01/25/17   Maren Reamer, MD  furosemide (LASIX) 20 MG tablet Take 1 tab PO 1-2 times a weeks as needed for lower extremity edema 03/01/17   Ladell Pier, MD  gabapentin (NEURONTIN) 300 MG capsule Take 2 capsules (600 mg total) by mouth 2 (two) times daily. 03/01/17   Ladell Pier, MD  glucose blood (TRUE METRIX BLOOD GLUCOSE TEST) test strip Check glucose twice a day 01/18/17   Lottie Mussel T, MD  insulin aspart (NOVOLOG) 100 UNIT/ML FlexPen Inject 30 Units into the skin 3 (three) times daily with meals. 03/01/17  Ladell Pier, MD  Insulin Glargine (LANTUS SOLOSTAR) 100 UNIT/ML Solostar Pen Inject 70 Units into the skin daily at 10 pm. 03/01/17   Ladell Pier, MD  levothyroxine (SYNTHROID, LEVOTHROID) 88 MCG tablet Take 1 tablet (88 mcg total) by mouth daily. 03/01/17   Ladell Pier, MD  liraglutide 18 MG/3ML SOPN 1.8 mg daily 12/07/16   Lottie Mussel T, MD  lisinopril (PRINIVIL,ZESTRIL) 40 MG tablet Take 1 tablet (40 mg total) by mouth daily. 03/01/17   Ladell Pier, MD  metoprolol (TOPROL-XL) 200 MG 24 hr tablet Take 1 tablet (200 mg total) by mouth daily. 03/01/17   Ladell Pier, MD  mometasone (NASONEX) 50 MCG/ACT nasal spray Place 2 sprays into the nose daily.  02/22/17   Tresa Garter, MD  Multiple Vitamins-Minerals (EYE VITAMINS) CAPS Take 1 capsule by mouth daily. 08/11/12   Thurnell Lose, MD  Naltrexone-Bupropion HCl ER 8-90 MG TB12 Take 1 tab po qam x 1week, then 1 tab po bid x 1 week, then 2 tabs po qam and 1 tab po qpm x 1 week then 2 tabs po bid 03/17/17   Romina Divirgilio, Marlena Clipper, MD  NYSTATIN powder APPLY TOPICALLY 4 TIMES DAILY. 01/18/17   Maren Reamer, MD  spironolactone (ALDACTONE) 25 MG tablet Take 1/2 tablet daily for 1 week then increase to 1/2 tablet twice a day 02/11/17   Troy Sine, MD    Objective:   Vitals:   03/17/17 1352  BP: (!) 142/81  Pulse: 92  Resp: 18  Temp: 98.2 F (36.8 C)  TempSrc: Oral  SpO2: 96%  Weight: 283 lb 9.6 oz (128.6 kg)  Height: '5\' 6"'  (1.676 m)   Exam General appearance : Awake, alert, not in any distress. Speech Clear. Not toxic looking, morbidly obese  HEENT: Atraumatic and Normocephalic, pupils equally reactive to light and accomodation Neck: Supple, no JVD. No cervical lymphadenopathy.  Chest: Good air entry bilaterally, no added sounds  CVS: S1 S2 regular, no murmurs.  Abdomen: Bowel sounds present, Non tender and not distended with no gaurding, rigidity or rebound. Extremities: Swollen and tender right lateral malleolus, mildly swollen right foot, range of motion intact,  both legs are warm to touch Neurology: Awake alert, and oriented X 3, CN II-XII intact, Non focal Skin: No Rash  Data Review Lab Results  Component Value Date   HGBA1C 6.0 12/28/2016   HGBA1C 8.0 11/25/2016   HGBA1C 8.8 09/08/2016    Assessment & Plan   1. Sprain of other ligament of right ankle, initial encounter  - DG Ankle Complete Right; Future  2. Essential hypertension  We have discussed target BP range and blood pressure goal. I have advised patient to check BP regularly and to call us back or report to clinic if the numbers are consistently higher than 140/90. We discussed the importance of  compliance with medical therapy and DASH diet recommended, consequences of uncontrolled hypertension discussed.  - continue current BP medications  3. Morbid obesity due to excess calories (HCC)  - Trial of medication, patient extensively educated about the effect of medications and possible side effects and also the need to control diet as part of measures to loss weight.  - Naltrexone-Bupropion HCl ER 8-90 MG TB12; Take 1 tab po qam x 1week, then 1 tab po bid x 1 week, then 2 tabs po qam and 1 tab po qpm x 1 week then 2 tabs po bid  Dispense: 90 tablet; Refill: 3  Patient have been counseled extensively about nutrition and exercise. Other issues discussed during this visit include: low cholesterol diet, weight control and daily exercise, foot care, annual eye examinations at Ophthalmology, importance of adherence with medications and regular follow-up. We also discussed long term complications of uncontrolled diabetes and hypertension.   Return if symptoms worsen or fail to improve, for Follow up Pain and comorbidities, Follow up HTN.  The patient was given clear instructions to go to ER or return to medical center if symptoms don't improve, worsen or new problems develop. The patient verbalized understanding. The patient was told to call to get lab results if they haven't heard anything in the next week.   This note has been created with Surveyor, quantity. Any transcriptional errors are unintentional.    Angelica Chessman, MD, [place], [place], [place], [place] and [place], [place]   03/17/2017, 2:29 PM

## 2017-03-18 ENCOUNTER — Telehealth: Payer: Self-pay | Admitting: *Deleted

## 2017-03-18 ENCOUNTER — Other Ambulatory Visit: Payer: Self-pay | Admitting: Internal Medicine

## 2017-03-18 DIAGNOSIS — S92501A Displaced unspecified fracture of right lesser toe(s), initial encounter for closed fracture: Secondary | ICD-10-CM

## 2017-03-18 NOTE — Telephone Encounter (Signed)
Patient is aware of the xray showing a fracture in the fifth toe. Patient is aware of being referred to the orthopedic. Medical Assistant left message on patient's home and cell voicemail. Voicemail states to give a call back to [place] with [facility] at [phone].

## 2017-03-18 NOTE — Telephone Encounter (Signed)
Left message to call back  

## 2017-03-18 NOTE — Telephone Encounter (Signed)
-----   Message from Tresa Garter, MD sent at 03/18/2017 12:48 PM EDT ----- [place] showed fracture, will refer to Orthopedics

## 2017-03-19 NOTE — Telephone Encounter (Signed)
Attempt to call patient x 3-no answer.  lmtcb x3.

## 2017-03-22 LAB — HM DIABETES EYE EXAM

## 2017-03-22 MED FILL — PREDNISOLONE AC 1% EYE DROP: 1 | 25 days supply | Qty: 5 | Fill #0

## 2017-03-22 MED FILL — OFLOXACIN 0.3% EYE DROPS: 0.3 | 25 days supply | Qty: 5 | Fill #0

## 2017-03-22 MED FILL — KETOROLAC 0.4% OPHTH SOLN: 0.4 | 25 days supply | Qty: 5 | Fill #0

## 2017-03-26 ENCOUNTER — Ambulatory Visit (INDEPENDENT_AMBULATORY_CARE_PROVIDER_SITE_OTHER): Payer: Medicaid Other | Admitting: Family

## 2017-03-26 DIAGNOSIS — S92501A Displaced unspecified fracture of right lesser toe(s), initial encounter for closed fracture: Secondary | ICD-10-CM | POA: Diagnosis not present

## 2017-03-29 ENCOUNTER — Ambulatory Visit: Payer: No Typology Code available for payment source | Admitting: Physical Therapy

## 2017-03-29 NOTE — Progress Notes (Signed)
Office Visit Note   Patient: Anita James           Date of Birth: 1951-01-23           MRN: [id number] Visit Date: 03/26/2017              Requested by: Tresa Garter, MD [address] [place], [place] [address] PCP: Ladell Pier, MD  No chief complaint on file.     HPI: The patient is 66 year old woman who presents today him 3 weeks status post injury to her right foot. She has had swelling and pain of the fifth toe. She states that she was just on her primary care provider's office limping earlier this week and they performed radiographs of her foot which were revealing for fracture of the proximal phalanx fifth toe. She him is unsure when she injured this. Thinks it was about 3 weeks ago. States stubbed her toe on furniture at home. Today she is in flip-flops full weightbearing.  Assessment & Plan: Visit Diagnoses:  1. Closed fracture of phalanx of right fifth toe, initial encounter     Plan: Provided a postop shoe. Him may use I provided an ice for pain and swelling. She'll follow-up in 3 weeks for continued pain or questions.  Follow-Up Instructions: Return in about 3 weeks (around 04/16/2017).   Ortho Exam  Patient is alert, oriented, no adenopathy, well-dressed, normal affect, normal respiratory effort. On examination her right foot is plantigrade. The fifth toe is mildly erythematous and mildly swollen. Is tender to palpation. flexion intact.  Imaging: No results found.  Labs: Lab Results  Component Value Date   HGBA1C 6.0 12/28/2016   HGBA1C 8.0 11/25/2016   HGBA1C 8.8 09/08/2016   ESRSEDRATE 5 12/30/2011   REPTSTATUS 09/21/2013 FINAL 09/19/2013   CULT  09/19/2013    ESCHERICHIA COLI Performed at [facility] NO GROWTH 09/26/2013    Orders:  No orders of the defined types were placed in this encounter.  No orders of the defined types were placed in this encounter.    Procedures: No procedures performed  Clinical  Data: No additional findings.  ROS:  All other systems negative, except as noted in the HPI. Review of Systems  Constitutional: Negative for chills and fever.  Musculoskeletal: Positive for arthralgias.    Objective: Vital Signs: There were no vitals taken for this visit.  Specialty Comments:  No specialty comments available.  PMFS History: Patient Active Problem List   Diagnosis Date Noted  . Sprain of right ankle 03/17/2017  . Morbid obesity due to excess calories ([facility]) 03/17/2017  . Neuropathic pain 11/04/2016  . Carpal tunnel syndrome 06/14/2014  . 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 Disturbance of skin sensation 06/08/2014  . Cervical radiculopathy at C8 06/05/2014  . Hyperlipemia 09/12/2013  . Diastolic CHF ([facility]) 42/87/6811  . Major depression 09/12/2013  . Allergic rhinitis 06/22/2013  . Chronic pain syndrome 05/31/2013  . Dysphagia 12/26/2012  . Gastroesophageal reflux disease without esophagitis 08/30/2012  . Essential hypertension   . Hypothyroidism   . Type 2 diabetes, uncontrolled, with neuropathy ([facility])    Past Medical History:  Diagnosis Date  . Anxiety   . Carpal tunnel syndrome 06/14/2014  . Chest pain   . CHF (congestive heart failure) ([facility])   . Chronic back pain   . Chronic kidney disease 03/2013   failure due to meds  . Diabetes mellitus type II, uncontrolled ([facility])   . Diverticulitis   .  Frequent headaches   . Hiatal hernia   . Hyperlipemia   . Hypertension   . Hypothyroidism   . 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 Neuropathy   . Obesity   . Pneumonia   . Vitamin D deficiency 06/05/2014    Family History  Problem Relation Age of Onset  . Cancer Mother        cervical- mets  . Cancer Father   . Heart disease Father   . Diabetes Father   . Diabetes Sister   . Heart disease Sister   . Heart disease Sister   . COPD Sister     Past Surgical History:  Procedure Laterality Date  . CESAREAN SECTION    . KNEE SURGERY Left   . THYROID  SURGERY     goiter   Social History   Occupational History  . unemployed    Social History Main Topics  . Smoking status: Never Smoker  . Smokeless tobacco: Never Used  . Alcohol use No  . Drug use: No  . Sexual activity: No

## 2017-03-31 ENCOUNTER — Telehealth: Payer: Self-pay | Admitting: Cardiovascular Disease

## 2017-03-31 NOTE — Telephone Encounter (Signed)
Received notes from Margot Ables for appointment on 04/30/17 with Dr Claiborne Billings.  Records put with Dr Evette Georges schedule for 04/30/17. lp

## 2017-04-05 ENCOUNTER — Encounter: Payer: Self-pay | Admitting: Internal Medicine

## 2017-04-05 ENCOUNTER — Ambulatory Visit: Payer: Medicaid Other | Attending: Internal Medicine | Admitting: Internal Medicine

## 2017-04-05 VITALS — BP 159/95 | HR 112 | Temp 99.2°F | Resp 16 | Wt 279.0 lb

## 2017-04-05 DIAGNOSIS — G5600 Carpal tunnel syndrome, unspecified upper limb: Secondary | ICD-10-CM | POA: Insufficient documentation

## 2017-04-05 DIAGNOSIS — I509 Heart failure, unspecified: Secondary | ICD-10-CM | POA: Insufficient documentation

## 2017-04-05 DIAGNOSIS — E039 Hypothyroidism, unspecified: Secondary | ICD-10-CM | POA: Insufficient documentation

## 2017-04-05 DIAGNOSIS — E559 Vitamin D deficiency, unspecified: Secondary | ICD-10-CM | POA: Diagnosis not present

## 2017-04-05 DIAGNOSIS — E1122 Type 2 diabetes mellitus with diabetic chronic kidney disease: Secondary | ICD-10-CM | POA: Diagnosis not present

## 2017-04-05 DIAGNOSIS — N189 Chronic kidney disease, unspecified: Secondary | ICD-10-CM | POA: Insufficient documentation

## 2017-04-05 DIAGNOSIS — Z881 Allergy status to other antibiotic agents status: Secondary | ICD-10-CM | POA: Insufficient documentation

## 2017-04-05 DIAGNOSIS — Z7982 Long term (current) use of aspirin: Secondary | ICD-10-CM | POA: Diagnosis not present

## 2017-04-05 DIAGNOSIS — E785 Hyperlipidemia, unspecified: Secondary | ICD-10-CM | POA: Diagnosis not present

## 2017-04-05 DIAGNOSIS — Z885 Allergy status to narcotic agent status: Secondary | ICD-10-CM | POA: Insufficient documentation

## 2017-04-05 DIAGNOSIS — E669 Obesity, unspecified: Secondary | ICD-10-CM | POA: Diagnosis not present

## 2017-04-05 DIAGNOSIS — Z882 Allergy status to sulfonamides status: Secondary | ICD-10-CM | POA: Insufficient documentation

## 2017-04-05 DIAGNOSIS — R300 Dysuria: Secondary | ICD-10-CM | POA: Diagnosis present

## 2017-04-05 DIAGNOSIS — Z794 Long term (current) use of insulin: Secondary | ICD-10-CM | POA: Diagnosis not present

## 2017-04-05 DIAGNOSIS — I130 Hypertensive heart and chronic kidney disease with heart failure and stage 1 through stage 4 chronic kidney disease, or unspecified chronic kidney disease: Secondary | ICD-10-CM | POA: Diagnosis not present

## 2017-04-05 DIAGNOSIS — R350 Frequency of micturition: Secondary | ICD-10-CM | POA: Diagnosis not present

## 2017-04-05 DIAGNOSIS — E1165 Type 2 diabetes mellitus with hyperglycemia: Secondary | ICD-10-CM | POA: Insufficient documentation

## 2017-04-05 DIAGNOSIS — Z880 Allergy status to penicillin: Secondary | ICD-10-CM | POA: Insufficient documentation

## 2017-04-05 DIAGNOSIS — E1140 Type 2 diabetes mellitus with diabetic neuropathy, unspecified: Secondary | ICD-10-CM

## 2017-04-05 DIAGNOSIS — Z888 Allergy status to other drugs, medicaments and biological substances status: Secondary | ICD-10-CM | POA: Insufficient documentation

## 2017-04-05 DIAGNOSIS — IMO0002 Reserved for concepts with insufficient information to code with codable children: Secondary | ICD-10-CM

## 2017-04-05 LAB — POCT URINALYSIS DIPSTICK
Bilirubin, UA: NEGATIVE
Glucose, UA: NEGATIVE
KETONES UA: NEGATIVE
Nitrite, UA: POSITIVE
PROTEIN UA: 100
SPEC GRAV UA: 1.015 (ref 1.010–1.025)
Urobilinogen, UA: 0.2 E.U./dL
pH, UA: 7 (ref 5.0–8.0)

## 2017-04-05 LAB — GLUCOSE, POCT (MANUAL RESULT ENTRY)
POC Glucose: 86 mg/dl (ref 70–99)
POC Glucose: 93 mg/dl (ref 70–99)

## 2017-04-05 MED ORDER — CIPROFLOXACIN HCL 500 MG PO TABS: 500.0000 mg | ORAL_TABLET | Freq: Two times a day (BID) | ORAL | 0 refills | Status: AC

## 2017-04-05 MED FILL — CIPROFLOXACIN HCL 500 MG TA: 500 | 7 days supply | Qty: 14 | Fill #0

## 2017-04-05 NOTE — Patient Instructions (Signed)

## 2017-04-05 NOTE — Progress Notes (Signed)
Anita James, is a 66 y.o. female  BJY:[id number]  YQM:[id number]  DOB - 10-Dec-1950  Chief Complaint  Patient presents with  . Urinary Tract Infection      Subective:   Anita James is a 66 y.o. female who presents today for an urgent care visit. Patient reports dysuria and urinary frequency since 2 days. These are similar symptoms to when she had UTI in the past. She has multiple medication allergies including penicillins, sulfa drugs and azithromycin. She denies Nausea, vomiting and diarrhea. She denies any fever. No abdominal pain, no change in bowel habit. [name], [name], [name], [name] Patient had right eye cataract surgery this morning.  ALLERGIES: Allergies  Allergen Reactions  . Amoxicillin-Pot Clavulanate Nausea And Vomiting and Other (See Comments)    Combination of medications tear lining of stomach  . Hydrocodone-Acetaminophen Shortness Of Breath  . Oxycodone Shortness Of Breath    oxycontin too  . Sulfa Antibiotics Shortness Of Breath  . Azithromycin   . Norvasc [Amlodipine Besylate] Swelling    feet  . Sulfamethoxazole Nausea Only  . Tramadol Other (See Comments)    Unknown, toxicity. Patient advised she had to be given Narcan with this    PAST MEDICAL HISTORY: Past Medical History:  Diagnosis Date  . Anxiety   . Carpal tunnel syndrome 06/14/2014  . Chest pain   . CHF (congestive heart failure) ([facility])   . Chronic back pain   . Chronic kidney disease 03/2013   failure due to meds  . Diabetes mellitus type II, uncontrolled ([facility])   . Diverticulitis   . Frequent headaches   . Hiatal hernia   . Hyperlipemia   . Hypertension   . Hypothyroidism   . 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 Neuropathy   . Obesity   . Pneumonia   . Vitamin D deficiency 06/05/2014    MEDICATIONS AT HOME: Prior to Admission medications   Medication Sig Start Date End Date Taking? Authorizing Provider  aspirin EC 81 MG  tablet Take 81 mg by mouth daily. 11/20/16   [provider]  atorvastatin (LIPITOR) 40 MG tablet Take 1 tablet (40 mg total) by mouth daily at 6 PM. 03/01/17   Ladell Pier, MD  Blood Glucose Monitoring Suppl (TRUE METRIX METER) w/Device KIT Use as instructed 09/08/16   Tresa Garter, MD  Ca Carbonate-Mag Hydroxide 550-110 MG CHEW Chew 1 tablet by mouth 2 (two) times daily. 11/20/16   [provider]  Cetirizine HCl 10 MG CAPS Take 1 capsule (10 mg total) by mouth daily. 05/28/16   Maren Reamer, MD  ciprofloxacin (CIPRO) 500 MG tablet Take 1 tablet (500 mg total) by mouth 2 (two) times daily. 04/05/17 04/12/17  Tresa Garter, MD  dexlansoprazole (DEXILANT) 60 MG capsule Take 1 capsule (60 mg total) by mouth daily. 01/25/17   Maren Reamer, MD  furosemide (LASIX) 20 MG tablet Take 1 tab PO 1-2 times a weeks as needed for lower extremity edema 03/01/17   Ladell Pier, MD  gabapentin (NEURONTIN) 300 MG capsule Take 2 capsules (600 mg total) by mouth 2 (two) times daily. 03/01/17   Ladell Pier, MD  glucose blood (TRUE METRIX BLOOD GLUCOSE TEST) test strip Check glucose twice a day 01/18/17   Lottie Mussel T, MD  insulin aspart (NOVOLOG) 100 UNIT/ML FlexPen Inject 30 Units into the skin 3 (three) times daily with meals.  03/01/17   Ladell Pier, MD  Insulin Glargine (LANTUS SOLOSTAR) 100 UNIT/ML Solostar Pen Inject 70 Units into the skin daily at 10 pm. 03/01/17   Ladell Pier, MD  levothyroxine (SYNTHROID, LEVOTHROID) 88 MCG tablet Take 1 tablet (88 mcg total) by mouth daily. 03/01/17   Ladell Pier, MD  liraglutide 18 MG/3ML SOPN 1.8 mg daily 12/07/16   Lottie Mussel T, MD  lisinopril (PRINIVIL,ZESTRIL) 40 MG tablet Take 1 tablet (40 mg total) by mouth daily. 03/01/17   Ladell Pier, MD  metoprolol (TOPROL-XL) 200 MG 24 hr tablet Take 1 tablet (200 mg total) by mouth daily. 03/01/17   Ladell Pier, MD  mometasone (NASONEX) 50  MCG/ACT nasal spray Place 2 sprays into the nose daily. 02/22/17   Tresa Garter, MD  Multiple Vitamins-Minerals (EYE VITAMINS) CAPS Take 1 capsule by mouth daily. 08/11/12   Thurnell Lose, MD  Naltrexone-Bupropion HCl ER 8-90 MG TB12 Take 1 tab po qam x 1week, then 1 tab po bid x 1 week, then 2 tabs po qam and 1 tab po qpm x 1 week then 2 tabs po bid 03/17/17   Itzabella Sorrels, Marlena Clipper, MD  NYSTATIN powder APPLY TOPICALLY 4 TIMES DAILY. 01/18/17   Maren Reamer, MD  spironolactone (ALDACTONE) 25 MG tablet Take 1/2 tablet daily for 1 week then increase to 1/2 tablet twice a day 02/11/17   Troy Sine, MD    Objective:   Vitals:   04/05/17 1528  BP: (!) 159/95  Pulse: (!) 112  Resp: 16  Temp: 99.2 F (37.3 C)  TempSrc: Oral  SpO2: 94%  Weight: 279 lb (126.6 kg)   Exam General appearance : Right Eye Patch in situ, Awake, alert, not in any distress. Speech Clear. Not toxic looking HEENT: Atraumatic and Normocephalic Neck: Supple, no JVD. No cervical lymphadenopathy.  Chest: Good air entry bilaterally, no added sounds  CVS: S1 S2 regular, no murmurs.  Abdomen: Negative RAT, Bowel sounds present, Non tender and not distended with no gaurding, rigidity or rebound. Extremities: B/L Lower Ext shows no edema, both legs are warm to touch Neurology: Awake alert, and oriented X 3, CN II-XII intact, Non focal Data Review Lab Results  Component Value Date   HGBA1C 6.0 12/28/2016   HGBA1C 8.0 11/25/2016   HGBA1C 8.8 09/08/2016    Assessment & Plan   1. Type 2 diabetes, uncontrolled, with neuropathy ([facility])  - POCT glucose (manual entry)  2. Dysuria  - POCT urinalysis dipstick showed ++ Nitrite, ++LE and high WBC - Urine sent for Culture and sensitivity  Empiric treatment: - ciprofloxacin (CIPRO) 500 MG tablet; Take 1 tablet (500 mg total) by mouth 2 (two) times daily.  Dispense: 14 tablet; Refill: 0  Patient have been counseled extensively about nutrition and exercise.  Other issues discussed during this visit include: low cholesterol diet, weight control and daily exercise, foot care, annual eye examinations at Ophthalmology, importance of adherence with medications and regular follow-up. We also discussed long term complications of uncontrolled diabetes and hypertension.   Return in about 4 weeks (around 05/03/2017) for Routine follow up.  The patient was given clear instructions to go to ER or return to medical center if symptoms don't improve, worsen or new problems develop. The patient verbalized understanding. The patient was told to call to get lab results if they haven't heard anything in the next week.   This note has been created with Museum/gallery curator and  smart Company secretary. Any transcriptional errors are unintentional.    Angelica Chessman, MD, [place], [facility]arilyn [place], [place] and [place], [place]   04/05/2017, 4:13 PM

## 2017-04-07 LAB — URINE CULTURE

## 2017-04-08 MED FILL — KETOROLAC 0.4% OPHTH SOLN: 0.4 | 25 days supply | Qty: 5 | Fill #1

## 2017-04-08 MED FILL — PREDNISOLONE AC 1% EYE DROP: 1 | 25 days supply | Qty: 5 | Fill #1

## 2017-04-08 MED FILL — OFLOXACIN 0.3% EYE DROPS: 0.3 | 25 days supply | Qty: 5 | Fill #1

## 2017-04-12 MED FILL — TRUE METRIX TEST STRIP: 50 days supply | Qty: 100 | Fill #1

## 2017-04-16 MED FILL — GABAPENTIN 300 MG CAPSULE: 300 | 30 days supply | Qty: 120 | Fill #1

## 2017-04-16 MED FILL — SPIRONOLACTONE 25 MG TABLET: 25 | 30 days supply | Qty: 30 | Fill #1

## 2017-04-16 MED FILL — METOPROLOL SUCC ER 200 MG T: 200 | 30 days supply | Qty: 30 | Fill #1

## 2017-04-16 MED FILL — LISINOPRIL 40 MG TABLET: 40 | 30 days supply | Qty: 30 | Fill #1

## 2017-04-16 MED FILL — FUROSEMIDE 20 MG TABLET: 20 | 30 days supply | Qty: 14 | Fill #1

## 2017-04-16 MED FILL — ATORVASTATIN 40 MG TABLET: 40 | 30 days supply | Qty: 30 | Fill #0

## 2017-04-21 ENCOUNTER — Ambulatory Visit: Payer: Medicaid Other | Attending: Internal Medicine | Admitting: *Deleted

## 2017-04-21 VITALS — BP 145/82 | HR 99 | Temp 98.2°F | Resp 16 | Wt 292.0 lb

## 2017-04-21 DIAGNOSIS — R300 Dysuria: Secondary | ICD-10-CM | POA: Diagnosis not present

## 2017-04-21 LAB — POCT URINALYSIS DIPSTICK
Bilirubin, UA: NEGATIVE
GLUCOSE UA: NEGATIVE
Ketones, UA: NEGATIVE
NITRITE UA: NEGATIVE
PH UA: 5.5 (ref 5.0–8.0)
Protein, UA: 30
RBC UA: NEGATIVE
Spec Grav, UA: 1.02 (ref 1.010–1.025)
UROBILINOGEN UA: 0.2 U/dL

## 2017-04-21 NOTE — Progress Notes (Signed)
Patient Triage Assessment Form Todays Date: Name: Anita James DOB: 1951-03-18 Reason for walkin: concern for UTI When did your symptoms start? today Please list symptoms: She fells pressure and burning feeling and nausea this morning  Are you having pain: discomfort with voiding  Assessment Vital Signs:  T: 98.2 P: 99 R: 16 SpO2: 95 BP: 145/82  Plan  Urine culture

## 2017-04-22 ENCOUNTER — Ambulatory Visit: Payer: No Typology Code available for payment source | Admitting: Internal Medicine

## 2017-04-23 LAB — URINE CULTURE

## 2017-04-29 MED FILL — FUROSEMIDE 40 MG TABLET: 40 | 30 days supply | Qty: 15 | Fill #0

## 2017-04-29 MED FILL — CARVEDILOL 12.5 MG TABLET: 12.5 | 30 days supply | Qty: 60 | Fill #0

## 2017-04-30 ENCOUNTER — Encounter: Payer: Self-pay | Admitting: Cardiovascular Disease

## 2017-04-30 ENCOUNTER — Ambulatory Visit (INDEPENDENT_AMBULATORY_CARE_PROVIDER_SITE_OTHER): Payer: No Typology Code available for payment source | Admitting: Cardiovascular Disease

## 2017-04-30 VITALS — BP 144/76 | HR 90 | Ht 66.0 in | Wt 291.6 lb

## 2017-04-30 DIAGNOSIS — M25471 Effusion, right ankle: Secondary | ICD-10-CM

## 2017-04-30 DIAGNOSIS — I10 Essential (primary) hypertension: Secondary | ICD-10-CM

## 2017-04-30 DIAGNOSIS — Z794 Long term (current) use of insulin: Secondary | ICD-10-CM

## 2017-04-30 DIAGNOSIS — G4733 Obstructive sleep apnea (adult) (pediatric): Secondary | ICD-10-CM

## 2017-04-30 DIAGNOSIS — E039 Hypothyroidism, unspecified: Secondary | ICD-10-CM

## 2017-04-30 DIAGNOSIS — I5032 Chronic diastolic (congestive) heart failure: Secondary | ICD-10-CM

## 2017-04-30 DIAGNOSIS — E118 Type 2 diabetes mellitus with unspecified complications: Secondary | ICD-10-CM

## 2017-04-30 DIAGNOSIS — E785 Hyperlipidemia, unspecified: Secondary | ICD-10-CM

## 2017-04-30 DIAGNOSIS — Z79899 Other long term (current) drug therapy: Secondary | ICD-10-CM

## 2017-04-30 DIAGNOSIS — M25472 Effusion, left ankle: Secondary | ICD-10-CM

## 2017-04-30 MED ORDER — SPIRONOLACTONE 25 MG PO TABS: 25.0000 mg | ORAL_TABLET | Freq: Two times a day (BID) | ORAL | 3 refills | Status: DC

## 2017-04-30 MED ORDER — SPIRONOLACTONE 25 MG PO TABS
25.0000 mg | ORAL_TABLET | Freq: Two times a day (BID) | ORAL | 3 refills | Status: DC
Start: 2017-04-30 — End: 2017-04-30

## 2017-04-30 MED FILL — SPIRONOLACTONE 25 MG TABLET: 25 | 30 days supply | Qty: 60 | Fill #0

## 2017-04-30 NOTE — Progress Notes (Signed)
Cardiology Office Note    Date:  05/02/2017   ID:  Anita James, DOB 1951/04/23, MRN [id number]  PCP:  Ladell Pier, MD  Cardiologist:  Shelva Majestic, MD   No chief complaint on file.  Initially referred by Dr. Clide Dales for cardiology consultation for shortness of breath and diastolic heart failure.  History of Present Illness:  Anita James is a 66 y.o. female who was referred by Dr. Janne Napoleon for cardiology consultation for increasing episodes of shortness of breath and possible heart failure. I saw her for initial evaluation in May 2018.  She presents for three-month follow-up evaluation.  Anita James has a history of hypertension, diabetes mellitus, hypothyroidism, obstructive sleep apnea for which she's on CPAP therapy, and hyperlipidemia.  She is been cared for by Dr. Janne Napoleon at the [facility].  She has had issues in the past with atypical chest pain.  Reportedly in 2013.  Ejection fraction was 55-60%.  In June 2017 an echo Doppler study was done which showed an EF of 65-70% with mild LVH and grade 1 diastolic dysfunction.  There was mitral annular calcification.  No significant additional valvular abnormalities.  She has experienced some episodic left turning and sharp chest pain and has had several emergency room evaluations.  She has also been evaluated at [facility]. An ultrasound of her abdomen showed hepatic steatosis.  She had  noticed mild shortness of breath particularly with step climbing.  She admits to a 60 pound weight gain over the past year.  She denied any exertional precipitation of chest tightness.  She denied PND, orthopnea. When I initially saw her, she was having somewhat sharp atypical chest pain which I did not feel was ischemic in etiology.  Her blood pressure was elevated despite being on furosemide, lisinopril 40 mg and Toprol-XL 20 mg daily.  With diastolic dysfunction, I initiated spironolactone initially at 12.5 mg for one week and then increase this to twice  a day.  I felt her weight gain undoubtedly contributed to sleep apnea and  diabetes mellitus.  On exam she also had a right eye cataract for which she was unaware of.  He subsequently saw an ophthalmologist and apparently underwent cataract surgery by Dr. Carolynn Sayers.  She has undergone a follow-up echo Doppler study on 02/26/2017 which showed an EF of 60-65%.  With grade 1 diastolic dysfunction, trivial MR and TR, and mild left atrial dilation.  She has felt somewhat improved with reference to shortness of breath.  Since she was seen she admits to further weight gain.  She presents for reevaluation.  Past Medical History:  Diagnosis Date  . Anxiety   . Carpal tunnel syndrome 06/14/2014  . Chest pain   . CHF (congestive heart failure) ([place])   . Chronic back pain   . Chronic kidney disease 03/2013   failure due to meds  . Diabetes mellitus type II, uncontrolled ([place])   . Diverticulitis   . Frequent headaches   . Hiatal hernia   . Hyperlipemia   . Hypertension   . Hypothyroidism   . 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 Neuropathy   . Obesity   . Pneumonia   . Vitamin D deficiency 06/05/2014    Past Surgical History:  Procedure Laterality Date  . CESAREAN SECTION    . KNEE SURGERY Left   . THYROID SURGERY     goiter    Current Medications: Outpatient Medications Prior to Visit  Medication Sig Dispense Refill  . aspirin EC 81  MG tablet Take 81 mg by mouth daily.    Marland Kitchen atorvastatin (LIPITOR) 40 MG tablet Take 1 tablet (40 mg total) by mouth daily at 6 PM. 90 tablet 3  . Blood Glucose Monitoring Suppl (TRUE METRIX METER) w/Device KIT Use as instructed 1 kit 0  . Ca Carbonate-Mag Hydroxide 550-110 MG CHEW Chew 1 tablet by mouth 2 (two) times daily.    . Cetirizine HCl 10 MG CAPS Take 1 capsule (10 mg total) by mouth daily. 30 capsule 0  . dexlansoprazole (DEXILANT) 60 MG capsule Take 1 capsule (60 mg total) by mouth daily. 30 capsule 3  . furosemide (LASIX) 20 MG tablet Take 1 tab PO 1-2  times a weeks as needed for lower extremity edema 30 tablet 0  . gabapentin (NEURONTIN) 300 MG capsule Take 2 capsules (600 mg total) by mouth 2 (two) times daily. 120 capsule 6  . glucose blood (TRUE METRIX BLOOD GLUCOSE TEST) test strip Check glucose twice a day 100 each 12  . insulin aspart (NOVOLOG) 100 UNIT/ML FlexPen Inject 30 Units into the skin 3 (three) times daily with meals. 30 mL 6  . Insulin Glargine (LANTUS SOLOSTAR) 100 UNIT/ML Solostar Pen Inject 70 Units into the skin daily at 10 pm. 5 pen 5  . levothyroxine (SYNTHROID, LEVOTHROID) 88 MCG tablet Take 1 tablet (88 mcg total) by mouth daily. 90 tablet 3  . liraglutide 18 MG/3ML SOPN 1.8 mg daily 18 mL 3  . lisinopril (PRINIVIL,ZESTRIL) 40 MG tablet Take 1 tablet (40 mg total) by mouth daily. 90 tablet 3  . metoprolol (TOPROL-XL) 200 MG 24 hr tablet Take 1 tablet (200 mg total) by mouth daily. 90 tablet 3  . mometasone (NASONEX) 50 MCG/ACT nasal spray Place 2 sprays into the nose daily. 17 g 3  . Multiple Vitamins-Minerals (EYE VITAMINS) CAPS Take 1 capsule by mouth daily.    . Naltrexone-Bupropion HCl ER 8-90 MG TB12 Take 1 tab po qam x 1week, then 1 tab po bid x 1 week, then 2 tabs po qam and 1 tab po qpm x 1 week then 2 tabs po bid 90 tablet 3  . NYSTATIN powder APPLY TOPICALLY 4 TIMES DAILY. 15 g 0  . spironolactone (ALDACTONE) 25 MG tablet Take 1/2 tablet daily for 1 week then increase to 1/2 tablet twice a day 30 tablet 6   No facility-administered medications prior to visit.      Allergies:   Amoxicillin-pot clavulanate; Hydrocodone-acetaminophen; Oxycodone; Sulfa antibiotics; Azithromycin; Norvasc [amlodipine besylate]; Sulfamethoxazole; and Tramadol   Social History   Social History  . Marital status: Divorced    Spouse name: N/A  . Number of children: 6  . Years of education: BA   Occupational History  . unemployed    Social History Main Topics  . Smoking status: Never Smoker  . Smokeless tobacco: Never Used    . Alcohol use No  . Drug use: No  . Sexual activity: No   Other Topics Concern  . None   Social History Narrative  . None    Additional slow, so history is notable that she is divorced for 31 years.  She has 6 children and 10 grandchildren.  She works in [place] for [employer].  There is no tobacco history.  She does not drink alcohol.  She does not exercise.  Family History:  The patient's family history includes COPD in her sister; Cancer in her father and mother; Diabetes in her father and sister; Heart disease in  her father, sister, and sister.  Her mother died at age 67 with cancer.  Her father died and had hypertension and cancer.  She has 4 sisters who are deceased and one sister who is living.   ROS General: Negative; No fevers, chills, or night sweats; Positive for weight gain HEENT: Positive for cataract surgery on 03/22/2017; No changes in vision or hearing, sinus congestion, difficulty swallowing Pulmonary: Negative; No cough, wheezing, shortness of breath, hemoptysis Cardiovascular: see HPI GI: Negative; positive for GERD GU: Negative; No dysuria, hematuria, or difficulty voiding Musculoskeletal: Negative; no myalgias, joint pain, or weakness Hematologic/Oncology: Negative; no easy bruising, bleeding Endocrine: Positive for diabetes mellitus on insulin, and hypothyroidism Neuro: Positive for peripheral neuropathy Skin: Negative; No rashes or skin lesions Psychiatric:Positive for anxiety Sleep: Positive for OSA, no daytime sleepiness, hypersomnolence, bruxism, restless legs, hypnogognic hallucinations, no cataplexy Other comprehensive 14 point system review is negative.   PHYSICAL EXAM:   VS:  BP (!) 144/76   Pulse 90   Ht _0  (1.676 m)   Wt 291 lb 9.6 oz (132.3 kg)   BMI 47.07 kg/m     Repeat blood pressure by me was 138/78  Wt Readings from Last 3 Encounters:  04/30/17 291 lb 9.6 oz (132.3 kg)  04/21/17 292 lb (132.5 kg)  04/05/17 279 lb (126.6 kg)      General: Alert, oriented, no distress.  Skin: normal turgor, no rashes, warm and dry HEENT: Normocephalic, atraumatic. Pupils equal round and reactive to light; sclera anicteric; extraocular muscles intact; Fundi recent cataract surgery Nose without nasal septal hypertrophy Mouth/Parynx benign; Mallinpatti scale 4 Neck: No JVD, no carotid bruits; normal carotid upstroke Lungs: clear to ausculatation and percussion; no wheezing or rales Chest wall: without tenderness to palpitation Heart: PMI not displaced, RRR, s1 s2 normal, 1/6 systolic murmur, no diastolic murmur, no rubs, gallops, thrills, or heaves Abdomen: Moderate central and a possibly soft, nontender; no hepatosplenomehaly, BS+; abdominal aorta nontender and not dilated by palpation. Back: no CVA tenderness Pulses 2+ Musculoskeletal: full range of motion, normal strength, no joint deformities Extremities: Trace bilateral ankle edema; no clubbing cyanosis  Homan's sign negative  Neurologic: grossly nonfocal; Cranial nerves grossly wnl Psychologic: Normal mood and affect   Studies/Labs Reviewed:   EKG:  EKG is ordered today. Normal sinus rhythm at 90 bpm.  Borderline first-degree AV block with a PR interval 22 ms.  May 2018 ECG (independently read by me):Normal sinus rhythm at 88 bpm.  No ectopy.  QTc interval normal.  Recent Labs: BMP Latest Ref Rng & Units 02/24/2017 12/28/2016 11/25/2016  Glucose 65 - 99 mg/dL 69 64(L) 191(H)  BUN 8 - 27 mg/dL _1 Creatinine 0.57 - 1.00 mg/dL 0.82 0.82 0.97  BUN/Creat Ratio 12 - _2 -  Sodium 134 - 144 mmol/L 147(H) 146(H) 141  Potassium 3.5 - 5.2 mmol/L 4.5 3.9 5.0  Chloride 96 - 106 mmol/L 104 103 105  CO2 18 - 29 mmol/L _3 Calcium 8.7 - 10.3 mg/dL 9.5 9.6 9.2     Hepatic Function Latest Ref Rng & Units 01/13/2016 12/23/2013 06/21/2013  Total Protein 6.1 - 8.1 g/dL 6.5 7.1 7.1  Albumin 3.6 - 5.1 g/dL 4.1 3.5 3.7  AST 10 - 35 U/L 22 18 33  ALT 6 - 29 U/L _4 Alk  Phosphatase 33 - 130 U/L 60 56 69  Total Bilirubin 0.2 - 1.2 mg/dL 0.4 0.3 0.5    CBC  Latest Ref Rng & Units 01/18/2017 12/23/2013 06/21/2013  WBC 3.4 - 10.8 x10E3/uL 7.5 5.3 5.4  Hemoglobin 11.1 - 15.9 g/dL 12.2 14.2 13.5  Hematocrit 34.0 - 46.6 % 36.3 40.4 37.9  Platelets 150 - 379 x10E3/uL 221 189 209   Lab Results  Component Value Date   MCV 91 01/18/2017   MCV 87.1 12/23/2013   MCV 84 06/21/2013   Lab Results  Component Value Date   TSH 0.928 12/28/2016   Lab Results  Component Value Date   HGBA1C 6.0 12/28/2016     BNP    Component Value Date/Time   BNP 9.6 12/28/2016 1222   BNP 10.1 11/25/2016 1115    ProBNP    Component Value Date/Time   PROBNP CANCELED 03/09/2016 1703     Lipid Panel     Component Value Date/Time   CHOL 110 11/25/2016 1115   TRIG 169 (H) 11/25/2016 1115   HDL 36 (L) 11/25/2016 1115   CHOLHDL 3.1 11/25/2016 1115   VLDL 34 (H) 11/25/2016 1115   LDLCALC 40 11/25/2016 1115     RADIOLOGY: No results found.   Additional studies/ records that were reviewed today include:  I reviewed prior ER records, laboratory, prior echo Doppler study, recent ultrasound at [facility].  I also reviewed the recent echo Doppler study from 03/08/2017.    ASSESSMENT:    1. Essential hypertension   2. Chronic diastolic congestive heart failure ([place])   3. Morbid obesity ([place])   4. Medication management   5. Hyperlipidemia with target LDL less than 70   6. OSA (obstructive sleep apnea)   7. Type 2 diabetes mellitus with complication, with long-term current use of insulin ([place])   8. Hypothyroidism, unspecified type   9. Ankle edema, bilateral      PLAN:  Anita James is a 66 year old Caucasian female who has a history of morbid obesity, hypertension, type 2 diabetes mellitus on insulin, hypothyroidism, hyperlipidemia, and obstructive sleep apnea on CPAP therapy.  She also has been found to have hepatic steatosis.  Remotely, she was found to have  normal systolic function and is felt to have chronic diastolic dysfunction.  When I initially saw her, she was complaining of shortness of breath with walking, and particularly if step climbing.  There was some atypical chest pain features which I did not believe or skin making etiology.  His spironolactone with her diastolic dysfunction.  Her blood pressure today is improved, but based on new criteria is still mildly elevated.  She is on lisinopril 40 mg, Toprol-XL 200 mg, and spironolactone 12.5 mg twice a day.  She has been taking furosemide on an as-needed basis.  The penny upon her leg edema.  I reviewed her most recent echo Doppler study.  This continues to show normal systolic function with grade 1 diastolic dysfunction.  With her elevation of blood pressure, I further titrating spironolactone to 25 mg twice a day.  A follow-up be met will be obtained in one week.  She admits to gaining over 60 pounds over the past year.  Since her prior office visit she has gained additional weight.  I again stressed the importance of weight loss and increased exercise.  She continues to use CPAP for sleep apnea.  She is diabetic on Lantus insulin and NovoLog.  She is on atorvastatin 40 mg with target LDL less than 70 in this diabetic female.  She has a history of hypothyroidism and currently is on levothyroxine 88 g.  There  also is a history of peripheral neuropathy for which he takes gabapentin.  She will be following up with her primary M.D. in 2 weeks.  I didn't stress the importance of weight loss.  I will see her in 6 weeks for follow-up cardiology evaluation.  Medication Adjustments/Labs and Tests Ordered: Current medicines are reviewed at length with the patient today.  Concerns regarding medicines are outlined above.  Medication changes, Labs and Tests ordered today are listed in the Patient Instructions below. Patient Instructions  Medication Instructions:  INCREASE spironolactone to 25 mg two times  daily.  Labwork: Please return for blood work in [facility]. (BMET).     Follow-Up: Your physician wants you to follow-up in: 6 MONTHS with Dr. Claiborne Billings. You will receive a reminder letter in the mail two months in advance. If you don't receive a letter, please call our office to schedule the follow-up appointment.   Any Other Special Instructions Will Be Listed Below (If Applicable).     If you need a refill on your cardiac medications before your next appointment, please call your pharmacy.      Signed, Shelva Majestic, MD  05/02/2017 3:02 PM    [facility] [address], [place], [place], [place]  [address] Phone: [phone]

## 2017-04-30 NOTE — Patient Instructions (Signed)
Medication Instructions:  INCREASE spironolactone to 25 mg two times daily.  Labwork: Please return for blood work in [facility]. ([facility]).     Follow-Up: Your physician wants you to follow-up in: 6 MONTHS with Dr. Claiborne Billings. You will receive a reminder letter in the mail two months in advance. If you don't receive a letter, please call our office to schedule the follow-up appointment.   Any Other Special Instructions Will Be Listed Below (If Applicable).     If you need a refill on your cardiac medications before your next appointment, please call your pharmacy.

## 2017-05-03 NOTE — Addendum Note (Signed)
Addended by: Zebedee Iba on: 05/03/2017 08:47 AM   Modules accepted: Orders

## 2017-05-04 MED FILL — PREDNISOLONE AC 1% EYE DROP: 1 | 25 days supply | Qty: 5 | Fill #2

## 2017-05-05 ENCOUNTER — Telehealth: Payer: Self-pay | Admitting: Internal Medicine

## 2017-05-05 NOTE — Telephone Encounter (Signed)
Patient is requesting vyvance for weight loss due to current medication being too expensive. Please advise.

## 2017-05-05 NOTE — Telephone Encounter (Signed)
Pt. Came to facility requesting an Rx for Vyvance which helps with weight loss. Pt. States that Dr. Doreene Burke had prescribed her a medication for weight loss but it is expensive. Pleasef/u with pt.

## 2017-05-05 NOTE — Telephone Encounter (Signed)
Pt. Came to facility requesting for a form to be filled out for medical clearance. Pt. Would like a copy of her medications and the form faxed to [phone]. Pt. Would also like to pick up the form on her OV on 05/18/17. Form will be put in PCP box. Please f/u

## 2017-05-07 ENCOUNTER — Other Ambulatory Visit: Payer: Self-pay | Admitting: Internal Medicine

## 2017-05-07 MED ORDER — LISDEXAMFETAMINE DIMESYLATE 40 MG PO CAPS: 40.0000 mg | ORAL_CAPSULE | ORAL | 0 refills | Status: DC

## 2017-05-07 NOTE — Telephone Encounter (Signed)
Prescribed

## 2017-05-11 ENCOUNTER — Telehealth: Payer: Self-pay | Admitting: Internal Medicine

## 2017-05-11 DIAGNOSIS — Z9989 Dependence on other enabling machines and devices: Principal | ICD-10-CM

## 2017-05-11 DIAGNOSIS — G4733 Obstructive sleep apnea (adult) (pediatric): Secondary | ICD-10-CM

## 2017-05-11 NOTE — Telephone Encounter (Signed)
Patient called the office asking to speak with PCP in regards to getting an order for CPAP mask. Patient's mask broke during the night and is needing a new one. Patient will be using [facility].   Thank you

## 2017-05-11 NOTE — Telephone Encounter (Signed)
Call placed to patient and informed her of the two options that she has in regards to reorder masks for the CPAP. Patient prefer to use [facility] since she now has Medicaid, instead of contacting the [facility] organization. I informed patient that I would contact [facility] to see the time frame it would take for her to get the mask as well as the process.  Call placed to [facility] [phone], spoke with Elmo Putt and she informed that they can help patient with getting a new mask although CPAP is from different distributor but that I would need to first fax over order along with patient's information. Elmo Putt also stated that once information is received it take 48 hrs to process paper work, then a mask fitting will be scheduled. The only way patient can get the mask today is if she pays out of pocket.   Contacted patient again and informed her of what Elmo Putt from [facility] informed me. Pt was upset. She stated that she needs the mask today. Asked that I contact the sleep center to get her a mask.   [place] [phone], and spoke with Baxter Flattery. Explained to Baxter Flattery patient's situation and asked her if they carry any type of mask that patient can use in the meantime she waits for one from [facility]. Baxter Flattery said no.

## 2017-05-11 NOTE — Telephone Encounter (Signed)
Patient called the office asking to speak with PCP in regards to her CPAP mask. Patient stated that her mask broke and therefore needs a new one. Patient contacted the [facility] and they informed her that they can't help her and she would need to contact the distributor.  I informed patient that I will be returning her call as soon as I contact [facility] and sleep center.

## 2017-05-12 ENCOUNTER — Telehealth (HOSPITAL_COMMUNITY): Payer: Self-pay

## 2017-05-12 NOTE — Telephone Encounter (Signed)
Left message for patient reminding her about the at home FIT Test that was given to the patient in [facility] on 01/07/17. I let the patient know if she had any questions she could call me back.

## 2017-05-12 NOTE — Telephone Encounter (Signed)
Will fax fax to [facility] for Full Face mask Airfit  [id number].

## 2017-05-18 ENCOUNTER — Ambulatory Visit: Payer: Medicaid Other | Attending: Internal Medicine | Admitting: Internal Medicine

## 2017-05-18 VITALS — BP 107/69 | HR 102 | Temp 98.2°F | Resp 18 | Ht 66.0 in | Wt 287.0 lb

## 2017-05-18 DIAGNOSIS — I5032 Chronic diastolic (congestive) heart failure: Secondary | ICD-10-CM | POA: Diagnosis not present

## 2017-05-18 DIAGNOSIS — Z794 Long term (current) use of insulin: Secondary | ICD-10-CM | POA: Diagnosis not present

## 2017-05-18 DIAGNOSIS — E785 Hyperlipidemia, unspecified: Secondary | ICD-10-CM | POA: Insufficient documentation

## 2017-05-18 DIAGNOSIS — F5081 Binge eating disorder: Secondary | ICD-10-CM | POA: Insufficient documentation

## 2017-05-18 DIAGNOSIS — Z23 Encounter for immunization: Secondary | ICD-10-CM

## 2017-05-18 DIAGNOSIS — G894 Chronic pain syndrome: Secondary | ICD-10-CM | POA: Insufficient documentation

## 2017-05-18 DIAGNOSIS — Z9989 Dependence on other enabling machines and devices: Secondary | ICD-10-CM

## 2017-05-18 DIAGNOSIS — N390 Urinary tract infection, site not specified: Secondary | ICD-10-CM | POA: Diagnosis not present

## 2017-05-18 DIAGNOSIS — Z880 Allergy status to penicillin: Secondary | ICD-10-CM | POA: Insufficient documentation

## 2017-05-18 DIAGNOSIS — Z885 Allergy status to narcotic agent status: Secondary | ICD-10-CM | POA: Insufficient documentation

## 2017-05-18 DIAGNOSIS — R1310 Dysphagia, unspecified: Secondary | ICD-10-CM | POA: Insufficient documentation

## 2017-05-18 DIAGNOSIS — E1140 Type 2 diabetes mellitus with diabetic neuropathy, unspecified: Secondary | ICD-10-CM

## 2017-05-18 DIAGNOSIS — I110 Hypertensive heart disease with heart failure: Secondary | ICD-10-CM | POA: Insufficient documentation

## 2017-05-18 DIAGNOSIS — S93401A Sprain of unspecified ligament of right ankle, initial encounter: Secondary | ICD-10-CM | POA: Diagnosis not present

## 2017-05-18 DIAGNOSIS — G4733 Obstructive sleep apnea (adult) (pediatric): Secondary | ICD-10-CM

## 2017-05-18 DIAGNOSIS — Z825 Family history of asthma and other chronic lower respiratory diseases: Secondary | ICD-10-CM | POA: Insufficient documentation

## 2017-05-18 DIAGNOSIS — Z882 Allergy status to sulfonamides status: Secondary | ICD-10-CM | POA: Diagnosis not present

## 2017-05-18 DIAGNOSIS — G5600 Carpal tunnel syndrome, unspecified upper limb: Secondary | ICD-10-CM | POA: Insufficient documentation

## 2017-05-18 DIAGNOSIS — E890 Postprocedural hypothyroidism: Secondary | ICD-10-CM | POA: Diagnosis not present

## 2017-05-18 DIAGNOSIS — J309 Allergic rhinitis, unspecified: Secondary | ICD-10-CM | POA: Diagnosis not present

## 2017-05-18 DIAGNOSIS — Z833 Family history of diabetes mellitus: Secondary | ICD-10-CM | POA: Insufficient documentation

## 2017-05-18 DIAGNOSIS — I5030 Unspecified diastolic (congestive) heart failure: Secondary | ICD-10-CM

## 2017-05-18 DIAGNOSIS — F909 Attention-deficit hyperactivity disorder, unspecified type: Secondary | ICD-10-CM | POA: Diagnosis not present

## 2017-05-18 DIAGNOSIS — K219 Gastro-esophageal reflux disease without esophagitis: Secondary | ICD-10-CM | POA: Diagnosis not present

## 2017-05-18 DIAGNOSIS — F329 Major depressive disorder, single episode, unspecified: Secondary | ICD-10-CM | POA: Diagnosis not present

## 2017-05-18 DIAGNOSIS — Z8249 Family history of ischemic heart disease and other diseases of the circulatory system: Secondary | ICD-10-CM | POA: Insufficient documentation

## 2017-05-18 DIAGNOSIS — Z9889 Other specified postprocedural states: Secondary | ICD-10-CM | POA: Diagnosis not present

## 2017-05-18 DIAGNOSIS — Z79899 Other long term (current) drug therapy: Secondary | ICD-10-CM | POA: Insufficient documentation

## 2017-05-18 DIAGNOSIS — I10 Essential (primary) hypertension: Secondary | ICD-10-CM

## 2017-05-18 DIAGNOSIS — Z7982 Long term (current) use of aspirin: Secondary | ICD-10-CM | POA: Diagnosis not present

## 2017-05-18 DIAGNOSIS — Z808 Family history of malignant neoplasm of other organs or systems: Secondary | ICD-10-CM | POA: Insufficient documentation

## 2017-05-18 LAB — GLUCOSE, POCT (MANUAL RESULT ENTRY): POC Glucose: 133 mg/dl — AB (ref 70–99)

## 2017-05-18 LAB — POCT GLYCOSYLATED HEMOGLOBIN (HGB A1C): HEMOGLOBIN A1C: 6.1

## 2017-05-18 MED FILL — SPIRONOLACTONE 25 MG TABLET: 25 | 30 days supply | Qty: 30 | Fill #2

## 2017-05-18 MED FILL — LISINOPRIL 40 MG TABLET: 40 | 30 days supply | Qty: 30 | Fill #2

## 2017-05-18 MED FILL — NYSTOP 100,000 UNITS/GM PWD: 100000 | 10 days supply | Qty: 15 | Fill #0

## 2017-05-18 MED FILL — ATORVASTATIN 40 MG TABLET: 40 | 30 days supply | Qty: 30 | Fill #1

## 2017-05-18 NOTE — Patient Instructions (Signed)
Increase Gabapentin to 600 mg three times a day.   I will check with your cardiologist to see if we can safely use Phentermine to help with weight loss.

## 2017-05-18 NOTE — Progress Notes (Signed)
[name], female    DOB: April 02, 1951  MRN: [id number]  CC: Follow-up   Subjective: Anita James is a 66 y.o. female who presents for  Her concerns today include:  Patient with history of diabetes type 2, hypertension, hypothyroidism, obstructive sleep apnea on CPAP, morbid obesity, diastolic CHF with EF of 03-21%.   1. Obesity: -could not afford the Contrave -also reports trying Alli OTC without good results. On Victoza Wants to try a med called vyvance that her daughter or grand-daughter is taking with good results. Eating: "some days good and some days bad." Bake foods, does not salt anything.  -very little activity. Referred for some P.T on last visit but looks like still in work que at [employer] -she feels wgh went up after thyroid med dose lowered based on TSH lab results -request recheck thyroid level  2. Pain in both feet -shooting pain mainly over dorsum. Numbness/tingling sometimes On Gabapentin 600 mg BID; feels may not be as effective any more  3. DM: Checks BS 3-4 x a day -range100-200.  -compliant  With Lantus/Novolog and Victozia -followed bu endocrine at [facility] Last seen 02/2017  4. OSA: mask for CPAP broke -had called to request rxn for new one. She now has new mask and is using CPAP.   5. Teeth extraction: planned to have several teeth in upper jaw extracted. I did receive form from her dentist.  I made them aware that pt does have sleep apne  HM: pt due for colonoscopy - "had one don't want another" Patient Active Problem List   Diagnosis Date Noted  . Dysuria 04/05/2017  . Sprain of right ankle 03/17/2017  . Morbid obesity due to excess calories ([place]) 03/17/2017  . Neuropathic pain 11/04/2016  . Carpal tunnel syndrome 06/14/2014  . 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 Disturbance of skin sensation 06/08/2014  . Cervical radiculopathy at C8 06/05/2014  . UTI (urinary tract infection) 09/26/2013  . Hyperlipemia 09/12/2013  . Diastolic  CHF ([place]) 22/48/2500  . Major depression 09/12/2013  . Allergic rhinitis 06/22/2013  . Chronic pain syndrome 05/31/2013  . Dysphagia 12/26/2012  . Gastroesophageal reflux disease without esophagitis 08/30/2012  . Essential hypertension   . Hypothyroidism   . Type 2 diabetes, uncontrolled, with neuropathy [facility])      Current Outpatient Prescriptions on File Prior to Visit  Medication Sig Dispense Refill  . aspirin EC 81 MG tablet Take 81 mg by mouth daily.    Marland Kitchen atorvastatin (LIPITOR) 40 MG tablet Take 1 tablet (40 mg total) by mouth daily at 6 PM. 90 tablet 3  . Blood Glucose Monitoring Suppl (TRUE METRIX METER) w/Device KIT Use as instructed 1 kit 0  . Ca Carbonate-Mag Hydroxide 550-110 MG CHEW Chew 1 tablet by mouth 2 (two) times daily.    . Cetirizine HCl 10 MG CAPS Take 1 capsule (10 mg total) by mouth daily. 30 capsule 0  . dexlansoprazole (DEXILANT) 60 MG capsule Take 1 capsule (60 mg total) by mouth daily. 30 capsule 3  . furosemide (LASIX) 20 MG tablet Take 1 tab PO 1-2 times a weeks as needed for lower extremity edema 30 tablet 0  . gabapentin (NEURONTIN) 300 MG capsule Take 2 capsules (600 mg total) by mouth 2 (two) times daily. 120 capsule 6  . glucose blood (TRUE METRIX BLOOD GLUCOSE TEST) test strip Check glucose twice a day 100 each 12  . insulin aspart (NOVOLOG) 100 UNIT/ML FlexPen Inject 30 Units into  the skin 3 (three) times daily with meals. 30 mL 6  . Insulin Glargine (LANTUS SOLOSTAR) 100 UNIT/ML Solostar Pen Inject 70 Units into the skin daily at 10 pm. 5 pen 5  . levothyroxine (SYNTHROID, LEVOTHROID) 88 MCG tablet Take 1 tablet (88 mcg total) by mouth daily. 90 tablet 3  . liraglutide 18 MG/3ML SOPN 1.8 mg daily 18 mL 3  . lisdexamfetamine (VYVANSE) 40 MG capsule Take 1 capsule (40 mg total) by mouth every morning. 30 capsule 0  . lisinopril (PRINIVIL,ZESTRIL) 40 MG tablet Take 1 tablet (40 mg total) by mouth daily. 90 tablet 3  . mometasone (NASONEX) 50 MCG/ACT nasal  spray Place 2 sprays into the nose daily. 17 g 3  . Multiple Vitamins-Minerals (EYE VITAMINS) CAPS Take 1 capsule by mouth daily.    . Naltrexone-Bupropion HCl ER 8-90 MG TB12 Take 1 tab po qam x 1week, then 1 tab po bid x 1 week, then 2 tabs po qam and 1 tab po qpm x 1 week then 2 tabs po bid 90 tablet 3  . NYSTATIN powder APPLY TOPICALLY 4 TIMES DAILY. 15 g 0  . spironolactone (ALDACTONE) 25 MG tablet Take 1 tablet (25 mg total) by mouth 2 (two) times daily. 180 tablet 3  . [DISCONTINUED] fenofibrate (TRICOR) 145 MG tablet Take 1 tablet (145 mg total) by mouth daily. 30 tablet 2  . [DISCONTINUED] insulin detemir (LEVEMIR) 100 UNIT/ML injection Inject 60 Units into the skin at bedtime. 10 mL 1  . [DISCONTINUED] metoprolol (LOPRESSOR) 50 MG tablet Take 1 tablet (50 mg total) by mouth 2 (two) times daily. 60 tablet 2   No current facility-administered medications on file prior to visit.     Allergies  Allergen Reactions  . Amoxicillin-Pot Clavulanate Nausea And Vomiting and Other (See Comments)    Combination of medications tear lining of stomach  . Hydrocodone-Acetaminophen Shortness Of Breath  . Oxycodone Shortness Of Breath    oxycontin too  . Sulfa Antibiotics Shortness Of Breath  . Azithromycin   . Norvasc [Amlodipine Besylate] Swelling    feet  . Sulfamethoxazole Nausea Only  . Tramadol Other (See Comments)    Unknown, toxicity. Patient advised she had to be given Narcan with this    Social History   Social History  . Marital status: Divorced    Spouse name: N/A  . Number of children: 6  . Years of education: BA   Occupational History  . unemployed    Social History Main Topics  . Smoking status: Never Smoker  . Smokeless tobacco: Never Used  . Alcohol use No  . Drug use: No  . Sexual activity: No   Other Topics Concern  . Not on file   Social History Narrative  . No narrative on file    Family History  Problem Relation Age of Onset  . Cancer Mother         cervical- mets  . Cancer Father   . Heart disease Father   . Diabetes Father   . Diabetes Sister   . Heart disease Sister   . Heart disease Sister   . COPD Sister     Past Surgical History:  Procedure Laterality Date  . CESAREAN SECTION    . KNEE SURGERY Left   . THYROID SURGERY     goiter    ROS: Review of Systems Except as stated above  PHYSICAL EXAM: BP 107/69 (BP Location: Left Arm, Patient Position: Sitting, Cuff Size: Normal)  Pulse (!) 102   Temp 98.2 F (36.8 C) (Oral)   Resp 18   Ht _0  (1.676 m)   Wt 287 lb (130.2 kg)   SpO2 97%   BMI 46.32 kg/m   Wt Readings from Last 3 Encounters:  05/18/17 287 lb (130.2 kg)  04/30/17 291 lb 9.6 oz (132.3 kg)  04/21/17 292 lb (132.5 kg)   Physical Exam General appearance - alert, well appearing, obese female and in no distress Mental status - alert, oriented to person, place, and time, normal mood, behavior, speech, dress, motor activity, and thought processes Mouth - mucous membranes moist, pharynx normal without lesions Neck - supple, no significant adenopathy Chest -clear BL Heart - normal rate, regular rhythm, normal S1, S2, no murmurs, rubs, clicks or gallops Extremities - trace LE edema No ulcers/callous on feet   Lab Results  Component Value Date   HGBA1C 6.1 05/18/2017   Results for orders placed or performed in visit on 05/18/17  CBC  Result Value Ref Range   WBC 5.6 3.4 - 10.8 x10E3/uL   RBC 4.20 3.77 - 5.28 x10E6/uL   Hemoglobin 12.8 11.1 - 15.9 g/dL   Hematocrit 38.3 34.0 - 46.6 %   MCV 91 79 - 97 fL   MCH 30.5 26.6 - 33.0 pg   MCHC 33.4 31.5 - 35.7 g/dL   RDW 14.2 12.3 - 15.4 %   Platelets 215 150 - 379 x10E3/uL  Comprehensive metabolic panel  Result Value Ref Range   Glucose 114 (H) 65 - 99 mg/dL   BUN 21 8 - 27 mg/dL   Creatinine, Ser 1.13 (H) 0.57 - 1.00 mg/dL   GFR calc non Af Amer 52 (L) >59 mL/min/1.73   GFR calc Af Amer 60 >59 mL/min/1.73   BUN/Creatinine Ratio 19 12 - 28    Sodium 142 134 - 144 mmol/L   Potassium 4.3 3.5 - 5.2 mmol/L   Chloride 98 96 - 106 mmol/L   CO2 26 20 - 29 mmol/L   Calcium 9.1 8.7 - 10.3 mg/dL   Total Protein 7.3 6.0 - 8.5 g/dL   Albumin 4.4 3.6 - 4.8 g/dL   Globulin, Total 2.9 1.5 - 4.5 g/dL   Albumin/Globulin Ratio 1.5 1.2 - 2.2   Bilirubin Total 0.6 0.0 - 1.2 mg/dL   Alkaline Phosphatase 68 39 - 117 IU/L   AST 23 0 - 40 IU/L   ALT 22 0 - 32 IU/L  TSH  Result Value Ref Range   TSH 2.450 0.450 - 4.500 uIU/mL  POCT A1C  Result Value Ref Range   Hemoglobin A1C 6.1   Glucose (CBG)  Result Value Ref Range   POC Glucose 133 (A) 70 - 99 mg/dl     ASSESSMENT AND PLAN: 1. Type 2 diabetes, controlled, with neuropathy ([place]) -DM controlled Continue to encourage healthy eating habits. Encourage her to try to be more active -increase Gabapentin to 600 mg TID - CBC - Comprehensive metabolic panel - POCT E2A - Glucose (CBG)  2. OSA on CPAP -continue compliance  3. Essential hypertension -at goal  4. Morbid obesity ([place]) -dietary counseling given -encourage increase physical activity as tolerated Vyvance is used for ADHD and binge eating disorder. -talked about possible use of Phentermine but will check with her cardiologist first to see if he would advise in pt with diastolic CHF  5. Diastolic congestive heart failure, unspecified HF chronicity [facility]) -saw cardiologist recently at [facility]. Metoprolol changed to Coreg. Med list updated  6. Need for influenza vaccination - Flu Vaccine QUAD 6+ mos PF IM (Fluarix Quad PF)  7. Postoperative hypothyroidism - TSH   Patient was given the opportunity to ask questions.  Patient verbalized understanding of the plan and was able to repeat key elements of the plan.   Orders Placed This Encounter  Procedures  . Flu Vaccine QUAD 6+ mos PF IM (Fluarix Quad PF)  . CBC  . Comprehensive metabolic panel  . TSH  . POCT A1C  . Glucose (CBG)     Requested Prescriptions    No  prescriptions requested or ordered in this encounter    Return in about 3 months (around 08/18/2017).  Karle Plumber, MD, FACP

## 2017-05-19 ENCOUNTER — Telehealth: Payer: Self-pay | Admitting: Internal Medicine

## 2017-05-19 ENCOUNTER — Encounter: Payer: Self-pay | Admitting: Internal Medicine

## 2017-05-19 DIAGNOSIS — G4733 Obstructive sleep apnea (adult) (pediatric): Secondary | ICD-10-CM | POA: Insufficient documentation

## 2017-05-19 DIAGNOSIS — Z9989 Dependence on other enabling machines and devices: Secondary | ICD-10-CM

## 2017-05-19 LAB — CBC
HEMATOCRIT: 38.3 % (ref 34.0–46.6)
HEMOGLOBIN: 12.8 g/dL (ref 11.1–15.9)
MCH: 30.5 pg (ref 26.6–33.0)
MCHC: 33.4 g/dL (ref 31.5–35.7)
MCV: 91 fL (ref 79–97)
Platelets: 215 10*3/uL (ref 150–379)
RBC: 4.2 x10E6/uL (ref 3.77–5.28)
RDW: 14.2 % (ref 12.3–15.4)
WBC: 5.6 10*3/uL (ref 3.4–10.8)

## 2017-05-19 LAB — COMPREHENSIVE METABOLIC PANEL
A/G RATIO: 1.5 (ref 1.2–2.2)
ALBUMIN: 4.4 g/dL (ref 3.6–4.8)
ALK PHOS: 68 IU/L (ref 39–117)
ALT: 22 IU/L (ref 0–32)
AST: 23 IU/L (ref 0–40)
BILIRUBIN TOTAL: 0.6 mg/dL (ref 0.0–1.2)
BUN / CREAT RATIO: 19 (ref 12–28)
BUN: 21 mg/dL (ref 8–27)
CHLORIDE: 98 mmol/L (ref 96–106)
CO2: 26 mmol/L (ref 20–29)
CREATININE: 1.13 mg/dL — AB (ref 0.57–1.00)
Calcium: 9.1 mg/dL (ref 8.7–10.3)
GFR calc Af Amer: 60 mL/min/{1.73_m2} (ref >59)
GFR calc non Af Amer: 52 mL/min/{1.73_m2} — ABNORMAL LOW (ref >59)
GLOBULIN, TOTAL: 2.9 g/dL (ref 1.5–4.5)
Glucose: 114 mg/dL — ABNORMAL HIGH (ref 65–99)
Potassium: 4.3 mmol/L (ref 3.5–5.2)
SODIUM: 142 mmol/L (ref 134–144)
Total Protein: 7.3 g/dL (ref 6.0–8.5)

## 2017-05-19 LAB — TSH: TSH: 2.45 u[IU]/mL (ref 0.450–4.500)

## 2017-05-19 NOTE — Telephone Encounter (Signed)
PC placed to pt today.  Left message on voice mail informing her that thyroid level, blood count and liver function studies Okay. Kidney function not 100%. May try drinking a little bit more water. Avoid prolong use of  NSAIDS. Will recheck kideny function on next visit.

## 2017-05-26 ENCOUNTER — Ambulatory Visit: Payer: Medicaid Other | Attending: Internal Medicine

## 2017-05-26 DIAGNOSIS — Z79899 Other long term (current) drug therapy: Secondary | ICD-10-CM | POA: Insufficient documentation

## 2017-05-26 NOTE — Progress Notes (Signed)
Patient here for lab visit  

## 2017-05-27 ENCOUNTER — Telehealth: Payer: Self-pay | Admitting: *Deleted

## 2017-05-27 DIAGNOSIS — Z79899 Other long term (current) drug therapy: Secondary | ICD-10-CM

## 2017-05-27 MED ORDER — SPIRONOLACTONE 25 MG PO TABS: 25.0000 mg | ORAL_TABLET | Freq: Every day | ORAL | 3 refills | Status: DC

## 2017-05-27 NOTE — Telephone Encounter (Signed)
Received labwork from [place] drawn 9/5.  Creatinine 1.27.   Reviewed with Almyra Deforest PA, decrease spironolactone to 25mg  daily, monitor BP, and need repeat labs in 2 weeks.      Patient notified and verbalized understanding.

## 2017-06-02 ENCOUNTER — Other Ambulatory Visit: Payer: Self-pay | Admitting: Pharmacist

## 2017-06-02 MED ORDER — MOMETASONE FUROATE 50 MCG/ACT NA SUSP: 2.0000 | Freq: Every day | NASAL | 3 refills | Status: DC

## 2017-06-16 ENCOUNTER — Ambulatory Visit: Payer: Medicaid Other | Attending: Internal Medicine

## 2017-06-16 DIAGNOSIS — Z79899 Other long term (current) drug therapy: Secondary | ICD-10-CM | POA: Diagnosis not present

## 2017-06-16 MED FILL — FUROSEMIDE 40 MG TABLET: 40 | 30 days supply | Qty: 15 | Fill #1

## 2017-06-16 MED FILL — ATORVASTATIN 40 MG TABLET: 40 | 30 days supply | Qty: 30 | Fill #2

## 2017-06-16 MED FILL — LISINOPRIL 40 MG TABLET: 40 | 30 days supply | Qty: 30 | Fill #3

## 2017-06-16 MED FILL — CARVEDILOL 12.5 MG TABLET: 12.5 | 30 days supply | Qty: 60 | Fill #1

## 2017-06-16 MED FILL — GABAPENTIN 300 MG CAPSULE: 300 | 30 days supply | Qty: 120 | Fill #2

## 2017-06-16 MED FILL — SPIRONOLACTONE 25 MG TABLET: 25 | 30 days supply | Qty: 30 | Fill #3

## 2017-06-16 NOTE — Progress Notes (Signed)
Patient here for lab visit only 

## 2017-06-17 LAB — BASIC METABOLIC PANEL
BUN / CREAT RATIO: 16 (ref 12–28)
BUN: 14 mg/dL (ref 8–27)
CHLORIDE: 101 mmol/L (ref 96–106)
CO2: 24 mmol/L (ref 20–29)
Calcium: 8.8 mg/dL (ref 8.7–10.3)
Creatinine, Ser: 0.9 mg/dL (ref 0.57–1.00)
GFR calc Af Amer: 79 mL/min/{1.73_m2} (ref >59)
GFR calc non Af Amer: 68 mL/min/{1.73_m2} (ref >59)
GLUCOSE: 192 mg/dL — AB (ref 65–99)
POTASSIUM: 4.2 mmol/L (ref 3.5–5.2)
SODIUM: 141 mmol/L (ref 134–144)

## 2017-07-19 MED FILL — ATORVASTATIN 40 MG TABLET: 40 | 30 days supply | Qty: 30 | Fill #3

## 2017-07-19 MED FILL — GABAPENTIN 300 MG CAPSULE: 300 | 30 days supply | Qty: 120 | Fill #3

## 2017-07-19 MED FILL — LISINOPRIL 40 MG TABLET: 40 | 30 days supply | Qty: 30 | Fill #4

## 2017-07-19 MED FILL — CARVEDILOL 12.5 MG TABLET: 12.5 | 30 days supply | Qty: 60 | Fill #2

## 2017-07-19 MED FILL — SPIRONOLACTONE 25 MG TABLET: 25 | 30 days supply | Qty: 60 | Fill #1

## 2017-07-19 MED FILL — FUROSEMIDE 40 MG TABLET: 40 | 30 days supply | Qty: 15 | Fill #2

## 2017-07-20 ENCOUNTER — Other Ambulatory Visit: Payer: Self-pay | Admitting: Pharmacist

## 2017-07-20 MED ORDER — INSULIN GLARGINE 100 UNIT/ML SOLOSTAR PEN: 70.0000 [IU] | PEN_INJECTOR | Freq: Every day | SUBCUTANEOUS | 2 refills | Status: DC

## 2017-07-21 ENCOUNTER — Telehealth: Payer: Self-pay | Admitting: Internal Medicine

## 2017-07-21 ENCOUNTER — Telehealth: Payer: Self-pay | Admitting: Cardiovascular Disease

## 2017-07-21 NOTE — Telephone Encounter (Signed)
Patient came to the office to drop by DMV disability tag form.paper will be placed on the inbox of the provider. Please follow up

## 2017-07-21 NOTE — Telephone Encounter (Signed)
Left message to call back  

## 2017-07-21 NOTE — Telephone Encounter (Signed)
[name] is calling about some lab work and a prescription that was giving her Anita James) wants to know if its safe for her to take along with the medication that she is currently on . Please call

## 2017-07-22 NOTE — Telephone Encounter (Signed)
Spoke with pt she states that that she had labwork done in august and kidney function was impaired sot Dr Claiborne Billings decreased her spironolactone down to one pill and re-draw lab and kidney function was back in the normal range. She states that this is not the message that she received, she states that that was not the message that she received. She wants to know why she is not re-drawing the lab again to see if she still has impaired kidney function.  Also, she states that her weight has went up 40+# insce march and she has a new rx for Vyvance for weight loss. She was wondering if this medication is ok for her to take.

## 2017-07-23 ENCOUNTER — Telehealth: Payer: Self-pay

## 2017-07-23 NOTE — Telephone Encounter (Signed)
Follow-up renal function was entirely normal.  She does not need repeat laboratory.  I would prefer not using medication for weight loss.  I would not use Vyvanse, which is a stimulant

## 2017-07-23 NOTE — Telephone Encounter (Signed)
Contacted pt to inform her that her handicap placard application is ready for pickup. Pt didn't answer lvmn informing pt if this information

## 2017-07-27 NOTE — Telephone Encounter (Signed)
Pt notified, she is now asking what she can take instead for weight loss. I informed her that the message said no medications and to make a plan with PCP and call back if she needs [facility] input.

## 2017-08-04 ENCOUNTER — Other Ambulatory Visit: Payer: Self-pay | Admitting: Pharmacist

## 2017-08-04 MED ORDER — DEXLANSOPRAZOLE 60 MG PO CPDR: 60.0000 mg | DELAYED_RELEASE_CAPSULE | Freq: Every day | ORAL | 0 refills | Status: DC

## 2017-08-17 ENCOUNTER — Ambulatory Visit: Payer: Medicaid Other | Attending: Internal Medicine | Admitting: Internal Medicine

## 2017-08-17 ENCOUNTER — Encounter: Payer: Self-pay | Admitting: Internal Medicine

## 2017-08-17 VITALS — BP 134/75 | HR 88 | Temp 98.2°F | Resp 18 | Ht 67.0 in | Wt 300.0 lb

## 2017-08-17 DIAGNOSIS — E118 Type 2 diabetes mellitus with unspecified complications: Secondary | ICD-10-CM

## 2017-08-17 DIAGNOSIS — E890 Postprocedural hypothyroidism: Secondary | ICD-10-CM | POA: Insufficient documentation

## 2017-08-17 DIAGNOSIS — E1165 Type 2 diabetes mellitus with hyperglycemia: Secondary | ICD-10-CM | POA: Insufficient documentation

## 2017-08-17 DIAGNOSIS — Z880 Allergy status to penicillin: Secondary | ICD-10-CM | POA: Diagnosis not present

## 2017-08-17 DIAGNOSIS — F329 Major depressive disorder, single episode, unspecified: Secondary | ICD-10-CM | POA: Diagnosis not present

## 2017-08-17 DIAGNOSIS — M5412 Radiculopathy, cervical region: Secondary | ICD-10-CM | POA: Insufficient documentation

## 2017-08-17 DIAGNOSIS — I110 Hypertensive heart disease with heart failure: Secondary | ICD-10-CM | POA: Insufficient documentation

## 2017-08-17 DIAGNOSIS — K219 Gastro-esophageal reflux disease without esophagitis: Secondary | ICD-10-CM | POA: Insufficient documentation

## 2017-08-17 DIAGNOSIS — R0602 Shortness of breath: Secondary | ICD-10-CM | POA: Insufficient documentation

## 2017-08-17 DIAGNOSIS — Z794 Long term (current) use of insulin: Secondary | ICD-10-CM | POA: Insufficient documentation

## 2017-08-17 DIAGNOSIS — E1140 Type 2 diabetes mellitus with diabetic neuropathy, unspecified: Secondary | ICD-10-CM | POA: Diagnosis not present

## 2017-08-17 DIAGNOSIS — G4733 Obstructive sleep apnea (adult) (pediatric): Secondary | ICD-10-CM | POA: Insufficient documentation

## 2017-08-17 DIAGNOSIS — Z6841 Body Mass Index (BMI) 40.0 and over, adult: Secondary | ICD-10-CM | POA: Insufficient documentation

## 2017-08-17 DIAGNOSIS — G894 Chronic pain syndrome: Secondary | ICD-10-CM | POA: Insufficient documentation

## 2017-08-17 DIAGNOSIS — I10 Essential (primary) hypertension: Secondary | ICD-10-CM | POA: Diagnosis not present

## 2017-08-17 DIAGNOSIS — IMO0002 Reserved for concepts with insufficient information to code with codable children: Secondary | ICD-10-CM

## 2017-08-17 DIAGNOSIS — Z7982 Long term (current) use of aspirin: Secondary | ICD-10-CM | POA: Insufficient documentation

## 2017-08-17 DIAGNOSIS — Z9989 Dependence on other enabling machines and devices: Secondary | ICD-10-CM

## 2017-08-17 DIAGNOSIS — Z79899 Other long term (current) drug therapy: Secondary | ICD-10-CM | POA: Insufficient documentation

## 2017-08-17 DIAGNOSIS — Z111 Encounter for screening for respiratory tuberculosis: Secondary | ICD-10-CM | POA: Insufficient documentation

## 2017-08-17 DIAGNOSIS — E785 Hyperlipidemia, unspecified: Secondary | ICD-10-CM | POA: Diagnosis not present

## 2017-08-17 DIAGNOSIS — I5032 Chronic diastolic (congestive) heart failure: Secondary | ICD-10-CM | POA: Insufficient documentation

## 2017-08-17 LAB — POCT URINALYSIS DIPSTICK
Bilirubin, UA: NEGATIVE
Glucose, UA: 500
KETONES UA: NEGATIVE
LEUKOCYTES UA: NEGATIVE
NITRITE: NEGATIVE
Protein, UA: NEGATIVE
RBC UA: NEGATIVE
Spec Grav, UA: 1.01 (ref 1.010–1.025)
Urobilinogen, UA: 0.2 E.U./dL
pH, UA: 5.5 (ref 5.0–8.0)

## 2017-08-17 LAB — GLUCOSE, POCT (MANUAL RESULT ENTRY): POC Glucose: 397 mg/dl — AB (ref 70–99)

## 2017-08-17 MED ORDER — INSULIN GLARGINE 100 UNIT/ML SOLOSTAR PEN: 74.0000 [IU] | PEN_INJECTOR | Freq: Every day | SUBCUTANEOUS | 2 refills | Status: DC

## 2017-08-17 MED ORDER — INSULIN ASPART 100 UNIT/ML FLEXPEN: 33.0000 [IU] | PEN_INJECTOR | Freq: Three times a day (TID) | SUBCUTANEOUS | 6 refills | Status: DC

## 2017-08-17 MED FILL — FUROSEMIDE 40 MG TAB: 40 | 30 days supply | Qty: 15 | Fill #3

## 2017-08-17 MED FILL — ATORVASTATIN 40 MG TABLET: 40 | 30 days supply | Qty: 30 | Fill #4

## 2017-08-17 MED FILL — SPIRONOLACTONE 25 MG TABLET: 25 | 30 days supply | Qty: 60 | Fill #2

## 2017-08-17 MED FILL — GABAPENTIN 300 MG CAPSULE: 300 | 30 days supply | Qty: 120 | Fill #4

## 2017-08-17 MED FILL — CARVEDILOL 12.5 MG TABLET: 12.5 | 30 days supply | Qty: 60 | Fill #3

## 2017-08-17 MED FILL — LISINOPRIL 40 MG TAB: 40 | 30 days supply | Qty: 30 | Fill #5

## 2017-08-17 NOTE — Patient Instructions (Signed)
Increase Lantus to 74 units daily and Novolog to 33 units three times a day with meals.   You have been referred to a nutritionist and a surgeon to be considered for weight reduction surgery

## 2017-08-17 NOTE — Progress Notes (Signed)
[name], female    DOB: [birth year]  MRN: [id number]  CC: PPD Placement and Shortness of Breath (with exertion)   Subjective: Anita James is a 66 y.o. female who presents for chronic ds management. Her concerns today include:  Patient with history of diabetes type 2, hypertension, hypothyroidism, obstructive sleep apnea on CPAP, morbid obesity, diastolic CHF with EF of 82-50%.   1. Obesity: She continues to be concerned about wgh. She has gained since last visit. She has problems controlling her appetite. "The more I try, the more I eat."  Not getting in any exercise.  "I can not walk from here to there without feeling SOB." No CP. Some LE edema -She asked her cardiologist Dr. Claiborne Billings about Orlene Plum and he advised against stimulant diet pills  2. CPAP: not sleeping well. Having problem scheduling appt to be fitted for right mask with [facility].  She also wants to find out from them about whether there is a battery powered machine because when she loses power, she is unable to use the current one -shouldl battery  3. DM:  -"doing so so." Checks BS 2-4 x a day. Gives range of 150-200s.    -has appt with Endo at [facility]  in Dec -compliant with Victozia, Lantus and Novolog  4. HTN/dia CHF: -compliant with meds (Lasix, Spironolactone, Coreg and Lisinopril) and salt restriction Patient Active Problem List   Diagnosis Date Noted  . OSA on CPAP 05/19/2017  . Morbid obesity due to excess calories ([place]) 03/17/2017  . Neuropathic pain 11/04/2016  . Carpal tunnel syndrome 06/14/2014  . 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 Cervical radiculopathy at C8 06/05/2014  . Hyperlipemia 09/12/2013  . Diastolic CHF ([place]) 03/70/4888  . Major depression 09/12/2013  . Allergic rhinitis 06/22/2013  . Chronic pain syndrome 05/31/2013  . Gastroesophageal reflux disease without esophagitis 08/30/2012  . Essential hypertension   . Hypothyroidism   . Type 2 diabetes, uncontrolled, with  neuropathy ([place])      Current Outpatient Medications on File Prior to Visit  Medication Sig Dispense Refill  . aspirin EC 81 MG tablet Take 81 mg by mouth daily.    Marland Kitchen atorvastatin (LIPITOR) 40 MG tablet Take 1 tablet (40 mg total) by mouth daily at 6 PM. 90 tablet 3  . Blood Glucose Monitoring Suppl (TRUE METRIX METER) w/Device KIT Use as instructed 1 kit 0  . Ca Carbonate-Mag Hydroxide 550-110 MG CHEW Chew 1 tablet by mouth 2 (two) times daily.    . carvedilol (COREG) 12.5 MG tablet Take 12.5 mg by mouth 2 (two) times daily with a meal.    . Cetirizine HCl 10 MG CAPS Take 1 capsule (10 mg total) by mouth daily. 30 capsule 0  . dexlansoprazole (DEXILANT) 60 MG capsule Take 1 capsule (60 mg total) daily by mouth. 30 capsule 0  . furosemide (LASIX) 20 MG tablet Take 1 tab PO 1-2 times a weeks as needed for lower extremity edema 30 tablet 0  . gabapentin (NEURONTIN) 300 MG capsule Take 2 capsules (600 mg total) by mouth 2 (two) times daily. 120 capsule 6  . glucose blood (TRUE METRIX BLOOD GLUCOSE TEST) test strip Check glucose twice a day 100 each 12  . insulin aspart (NOVOLOG) 100 UNIT/ML FlexPen Inject 30 Units into the skin 3 (three) times daily with meals. 30 mL 6  . Insulin Glargine (LANTUS SOLOSTAR) 100 UNIT/ML Solostar Pen Inject 70 Units into the skin daily at 10 pm. 5  pen 2  . levothyroxine (SYNTHROID, LEVOTHROID) 88 MCG tablet Take 1 tablet (88 mcg total) by mouth daily. 90 tablet 3  . liraglutide 18 MG/3ML SOPN 1.8 mg daily 18 mL 3  . lisdexamfetamine (VYVANSE) 40 MG capsule Take 1 capsule (40 mg total) by mouth every morning. 30 capsule 0  . lisinopril (PRINIVIL,ZESTRIL) 40 MG tablet Take 1 tablet (40 mg total) by mouth daily. 90 tablet 3  . mometasone (NASONEX) 50 MCG/ACT nasal spray Place 2 sprays into the nose daily. 17 g 3  . Multiple Vitamins-Minerals (EYE VITAMINS) CAPS Take 1 capsule by mouth daily.    . NYSTATIN powder APPLY TOPICALLY 4 TIMES DAILY. 15 g 0  . spironolactone  (ALDACTONE) 25 MG tablet Take 1 tablet (25 mg total) by mouth daily. 180 tablet 3  . [DISCONTINUED] fenofibrate (TRICOR) 145 MG tablet Take 1 tablet (145 mg total) by mouth daily. 30 tablet 2  . [DISCONTINUED] insulin detemir (LEVEMIR) 100 UNIT/ML injection Inject 60 Units into the skin at bedtime. 10 mL 1  . [DISCONTINUED] metoprolol (LOPRESSOR) 50 MG tablet Take 1 tablet (50 mg total) by mouth 2 (two) times daily. 60 tablet 2   No current facility-administered medications on file prior to visit.     Allergies  Allergen Reactions  . Amoxicillin-Pot Clavulanate Nausea And Vomiting and Other (See Comments)    Combination of medications tear lining of stomach  . Hydrocodone-Acetaminophen Shortness Of Breath  . Oxycodone Shortness Of Breath    oxycontin too  . Sulfa Antibiotics Shortness Of Breath  . Azithromycin   . Norvasc [Amlodipine Besylate] Swelling    feet  . Sulfamethoxazole Nausea Only  . Tramadol Other (See Comments)    Unknown, toxicity. Patient advised she had to be given Narcan with this    Social History   Socioeconomic History  . Marital status: Divorced    Spouse name: Not on file  . Number of children: 6  . Years of education: BA  . Highest education level: Not on file  Social Needs  . Financial resource strain: Not on file  . Food insecurity - worry: Not on file  . Food insecurity - inability: Not on file  . Transportation needs - medical: Not on file  . Transportation needs - non-medical: Not on file  Occupational History  . Occupation: unemployed  Tobacco Use  . Smoking status: Never Smoker  . Smokeless tobacco: Never Used  Substance and Sexual Activity  . Alcohol use: No  . Drug use: No  . Sexual activity: No  Other Topics Concern  . Not on file  Social History Narrative  . Not on file    Family History  Problem Relation Age of Onset  . Cancer Mother        cervical- mets  . Cancer Father   . Heart disease Father   . Diabetes Father   .  Diabetes Sister   . Heart disease Sister   . Heart disease Sister   . COPD Sister     Past Surgical History:  Procedure Laterality Date  . CESAREAN SECTION    . KNEE SURGERY Left   . THYROID SURGERY     goiter    ROS: Review of Systems Neg except as above PHYSICAL EXAM: BP 134/75 (BP Location: Left Arm, Patient Position: Sitting, Cuff Size: Large)   Pulse 88   Temp 98.2 F (36.8 C) (Oral)   Resp 18   Ht '5\' 7"'  (1.702 m)   Wt 300  lb (136.1 kg)   SpO2 95%   BMI 46.99 kg/m   Wt Readings from Last 3 Encounters:  08/17/17 300 lb (136.1 kg)  05/18/17 287 lb (130.2 kg)  04/30/17 291 lb 9.6 oz (132.3 kg)   Physical Exam  General appearance - alert, well appearing, and in no distress Mental status - alert, oriented to person, place, and time, normal mood, behavior, speech, dress, motor activity, and thought processes Mouth - mucous membranes moist, pharynx normal without lesions Neck - supple, no significant adenopathy Chest - clear to auscultation, no wheezes, rales or rhonchi, symmetric air entry Heart - normal rate, regular rhythm, normal S1, S2, no murmurs, rubs, clicks or gallops Extremities - trace LE edema  Lab Results  Component Value Date   HGBA1C 6.1 05/18/2017   Results for orders placed or performed in visit on 08/17/17  Glucose (CBG)  Result Value Ref Range   POC Glucose 397 (A) 70 - 99 mg/dl  Urinalysis Dipstick  Result Value Ref Range   Color, UA yellow    Clarity, UA clear    Glucose, UA 500    Bilirubin, UA neg    Ketones, UA neg    Spec Grav, UA 1.010 1.010 - 1.025   Blood, UA neg    pH, UA 5.5 5.0 - 8.0   Protein, UA neg    Urobilinogen, UA 0.2 0.2 or 1.0 E.U./dL   Nitrite neg    Leukocytes, UA Negative Negative    ASSESSMENT AND PLAN: 1. Type 2 diabetes, uncontrolled, with neuropathy ([facility]) -dietary counseling given Pt decline POC shot of Novolog stating she has some in her car. I recommended given herself an extra 8 units. She ate a  burger before coming in today and had given her usual 30 units Novolog -Inc Lantus to 74 units and Novolog to 33 units with meals - Glucose (CBG) - Hemoglobin A1c - Urinalysis Dipstick - insulin aspart (NOVOLOG) 100 UNIT/ML FlexPen; Inject 33 Units into the skin 3 (three) times daily with meals.  Dispense: 30 mL; Refill: 6 - Insulin Glargine (LANTUS SOLOSTAR) 100 UNIT/ML Solostar Pen; Inject 74 Units into the skin daily at 10 pm.  Dispense: 5 pen; Refill: 2  2. Visit for TB skin test Pt requested TB skin test today. She will be doing some home care - PPD  3. Morbid obesity ([place]) Discuss option of wgh reduction surgery and the different types. But even with this, she will still need to get in some form of exercise to maintain any wgh loss. She is interested in speaking with a surgeon about it. I also recommend referral to a nutritionist - Ambulatory referral to General Surgery - Amb ref to Medical Nutrition Therapy-MNT  4. Essential hypertension At goal  5. OSA on CPAP -will have case worker reach out to her to see what assistance she needs with getting a mask for her CPAP .  6. Postoperative hypothyroidism - TSH  8. Shortness of breath Likely deconditioning. CHF appears compensated. Will have her f/u with Dr. Claiborne Billings to make sure this is not an angina equivalent.  Patient was given the opportunity to ask questions.  Patient verbalized understanding of the plan and was able to repeat key elements of the plan.   Orders Placed This Encounter  Procedures  . Glucose (CBG)  . PPD     Requested Prescriptions    No prescriptions requested or ordered in this encounter    No Follow-up on file.  Karle Plumber, MD, FACP

## 2017-08-18 LAB — HEMOGLOBIN A1C
ESTIMATED AVERAGE GLUCOSE: 220 mg/dL
HEMOGLOBIN A1C: 9.3 % — AB (ref 4.8–5.6)

## 2017-08-18 LAB — TSH: TSH: 1.62 u[IU]/mL (ref 0.450–4.500)

## 2017-08-19 ENCOUNTER — Ambulatory Visit: Payer: No Typology Code available for payment source | Admitting: Internal Medicine

## 2017-08-19 ENCOUNTER — Ambulatory Visit: Payer: Medicaid Other | Attending: Internal Medicine

## 2017-08-19 LAB — TB SKIN TEST
Induration: 0 mm
TB SKIN TEST: NEGATIVE

## 2017-08-24 ENCOUNTER — Other Ambulatory Visit: Payer: Self-pay | Admitting: Pharmacist

## 2017-08-24 MED ORDER — MOMETASONE FUROATE 50 MCG/ACT NA SUSP: 2.0000 | Freq: Every day | NASAL | 3 refills | Status: DC

## 2017-08-27 NOTE — Progress Notes (Signed)
Patient is aware of needing to control her BS, discussed at visit.

## 2017-08-31 ENCOUNTER — Ambulatory Visit: Payer: Medicaid Other | Admitting: Registered"

## 2017-09-08 ENCOUNTER — Other Ambulatory Visit: Payer: Self-pay | Admitting: Pharmacist

## 2017-09-08 MED ORDER — DEXLANSOPRAZOLE 60 MG PO CPDR: 60.0000 mg | DELAYED_RELEASE_CAPSULE | Freq: Every day | ORAL | 0 refills | Status: DC

## 2017-09-16 ENCOUNTER — Ambulatory Visit (INDEPENDENT_AMBULATORY_CARE_PROVIDER_SITE_OTHER): Payer: Medicaid Other

## 2017-09-16 ENCOUNTER — Encounter (HOSPITAL_COMMUNITY): Payer: Self-pay | Admitting: Emergency Medicine

## 2017-09-16 ENCOUNTER — Other Ambulatory Visit: Payer: Self-pay

## 2017-09-16 ENCOUNTER — Ambulatory Visit (HOSPITAL_COMMUNITY)
Admission: EM | Admit: 2017-09-16 | Discharge: 2017-09-16 | Disposition: A | Discharge status: Home / Self Care | Payer: Medicaid Other | Attending: Emergency Medicine | Admitting: Emergency Medicine

## 2017-09-16 DIAGNOSIS — B9789 Other viral agents as the cause of diseases classified elsewhere: Secondary | ICD-10-CM | POA: Diagnosis not present

## 2017-09-16 DIAGNOSIS — J069 Acute upper respiratory infection, unspecified: Secondary | ICD-10-CM | POA: Diagnosis not present

## 2017-09-16 DIAGNOSIS — J9811 Atelectasis: Secondary | ICD-10-CM | POA: Diagnosis not present

## 2017-09-16 DIAGNOSIS — R05 Cough: Secondary | ICD-10-CM

## 2017-09-16 MED ORDER — AZITHROMYCIN 250 MG PO TABS: ORAL_TABLET | ORAL | 0 refills | Status: DC

## 2017-09-16 MED ORDER — BENZONATATE 100 MG PO CAPS: 100.0000 mg | ORAL_CAPSULE | Freq: Three times a day (TID) | ORAL | 0 refills | Status: DC

## 2017-09-16 NOTE — ED Triage Notes (Signed)
Onset 2-3 days ago.  Patient has cough and sob, soreness under left breast.  Has this pain with coughing.  Patient reports phlegm in throat, but cannot cough out.  No back pain

## 2017-09-16 NOTE — ED Provider Notes (Signed)
[place] [facility]    CSN: [id number] Arrival date & time: 09/16/17  1626     History   Chief Complaint Chief Complaint  Patient presents with  . Cough    HPI Anita James is a 66 y.o. female history of CHF presenting with cough, chest discomfort and increased shortness of breath for 3 days. Taking diabetic cough medicine. Endorsing a lot of mucus production.    HPI  Past Medical History:  Diagnosis Date  . Anxiety   . Carpal tunnel syndrome 06/14/2014  . Chest pain   . CHF (congestive heart failure) ([place])   . Chronic back pain   . Chronic kidney disease 03/2013   failure due to meds  . Diabetes mellitus type II, uncontrolled ([place])   . Diverticulitis   . Frequent headaches   . Hiatal hernia   . Hyperlipemia   . Hypertension   . Hypothyroidism   . 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 Neuropathy   . Obesity   . Pneumonia   . Vitamin D deficiency 06/05/2014    Patient Active Problem List   Diagnosis Date Noted  . OSA on CPAP 05/19/2017  . Morbid obesity due to excess calories ([place]) 03/17/2017  . Neuropathic pain 11/04/2016  . Carpal tunnel syndrome 06/14/2014  . 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 Cervical radiculopathy at C8 06/05/2014  . Hyperlipemia 09/12/2013  . Diastolic CHF ([place]) 63/89/3734  . Major depression 09/12/2013  . Allergic rhinitis 06/22/2013  . Chronic pain syndrome 05/31/2013  . Gastroesophageal reflux disease without esophagitis 08/30/2012  . Essential hypertension   . Hypothyroidism   . Type 2 diabetes, uncontrolled, with neuropathy [facility])     Past Surgical History:  Procedure Laterality Date  . CESAREAN SECTION    . KNEE SURGERY Left   . THYROID SURGERY     goiter    OB History    Gravida Para Term Preterm AB Living   '4 3 2 1 1 6   ' SAB TAB Ectopic Multiple Live Births       '1 2 5       ' Home Medications    Prior to Admission medications   Medication Sig Start Date End Date Taking? Authorizing Provider  aspirin EC 81  MG tablet Take 81 mg by mouth daily. 11/20/16   [provider]  atorvastatin (LIPITOR) 40 MG tablet Take 1 tablet (40 mg total) by mouth daily at 6 PM. 03/01/17   Ladell Pier, MD  azithromycin (ZITHROMAX) 250 MG tablet Take first 2 tablets together today, then 1 every day until finished. 09/16/17   Millissa Deese C, PA-C  benzonatate (TESSALON) 100 MG capsule Take 1 capsule (100 mg total) by mouth every 8 (eight) hours. 09/16/17   Harrie Cazarez C, PA-C  Blood Glucose Monitoring Suppl (TRUE METRIX METER) w/Device KIT Use as instructed 09/08/16   Tresa Garter, MD  Ca Carbonate-Mag Hydroxide 550-110 MG CHEW Chew 1 tablet by mouth 2 (two) times daily. 11/20/16   [provider]  carvedilol (COREG) 12.5 MG tablet Take 12.5 mg by mouth 2 (two) times daily with a meal.    [provider]  Cetirizine HCl 10 MG CAPS Take 1 capsule (10 mg total) by mouth daily. 05/28/16   Maren Reamer, MD  dexlansoprazole (DEXILANT) 60 MG capsule Take 1 capsule (60 mg total) by mouth daily. 09/08/17   Ladell Pier, MD  furosemide (LASIX) 20 MG tablet Take 1 tab PO 1-2 times  a weeks as needed for lower extremity edema 03/01/17   Ladell Pier, MD  gabapentin (NEURONTIN) 300 MG capsule Take 2 capsules (600 mg total) by mouth 2 (two) times daily. 03/01/17   Ladell Pier, MD  glucose blood (TRUE METRIX BLOOD GLUCOSE TEST) test strip Check glucose twice a day 01/18/17   Lottie Mussel T, MD  insulin aspart (NOVOLOG) 100 UNIT/ML FlexPen Inject 33 Units into the skin 3 (three) times daily with meals. 08/17/17   Ladell Pier, MD  Insulin Glargine (LANTUS SOLOSTAR) 100 UNIT/ML Solostar Pen Inject 74 Units into the skin daily at 10 pm. 08/17/17   Ladell Pier, MD  levothyroxine (SYNTHROID, LEVOTHROID) 88 MCG tablet Take 1 tablet (88 mcg total) by mouth daily. 03/01/17   Ladell Pier, MD  liraglutide 18 MG/3ML SOPN 1.8 mg daily 12/07/16   Lottie Mussel T, MD    lisdexamfetamine (VYVANSE) 40 MG capsule Take 1 capsule (40 mg total) by mouth every morning. 05/07/17   Tresa Garter, MD  lisinopril (PRINIVIL,ZESTRIL) 40 MG tablet Take 1 tablet (40 mg total) by mouth daily. 03/01/17   Ladell Pier, MD  mometasone (NASONEX) 50 MCG/ACT nasal spray Place 2 sprays into the nose daily. 08/24/17   Ladell Pier, MD  Multiple Vitamins-Minerals (EYE VITAMINS) CAPS Take 1 capsule by mouth daily. 08/11/12   Thurnell Lose, MD  NYSTATIN powder APPLY TOPICALLY 4 TIMES DAILY. 01/18/17   Maren Reamer, MD  spironolactone (ALDACTONE) 25 MG tablet Take 1 tablet (25 mg total) by mouth daily. 05/27/17   Almyra Deforest, PA    Family History Family History  Problem Relation Age of Onset  . Cancer Mother        cervical- mets  . Cancer Father   . Heart disease Father   . Diabetes Father   . Diabetes Sister   . Heart disease Sister   . Heart disease Sister   . COPD Sister     Social History Social History   Tobacco Use  . Smoking status: Never Smoker  . Smokeless tobacco: Never Used  Substance Use Topics  . Alcohol use: No  . Drug use: No     Allergies   Amoxicillin-pot clavulanate; Hydrocodone-acetaminophen; Oxycodone; Sulfa antibiotics; Azithromycin; Norvasc [amlodipine besylate]; Sulfamethoxazole; and Tramadol   Review of Systems Review of Systems  Constitutional: Negative for fatigue and fever.  HENT: Positive for congestion. Negative for ear pain, rhinorrhea and sore throat.   Respiratory: Positive for cough, chest tightness and shortness of breath.   Cardiovascular: Negative for chest pain.  Gastrointestinal: Negative for abdominal pain, nausea and vomiting.  Musculoskeletal: Negative for myalgias.  Skin: Negative for rash.  Neurological: Negative for dizziness, light-headedness and headaches.     Physical Exam Triage Vital Signs ED Triage Vitals  Enc Vitals Group     BP 09/16/17 1714 (!) 116/58     Pulse Rate 09/16/17 1714  89     Resp 09/16/17 1714 (!) 24     Temp 09/16/17 1714 98.3 F (36.8 C)     Temp Source 09/16/17 1714 Oral     SpO2 09/16/17 1714 96 %     Weight --      Height --      Head Circumference --      Peak Flow --      Pain Score 09/16/17 1711 10     Pain Loc --      Pain Edu? --  Excl. in GC? --    No data found.  Updated Vital Signs BP (!) 116/58 (BP Location: Left Arm) Comment: large cuff  Pulse 89   Temp 98.3 F (36.8 C) (Oral)   Resp (!) 24   SpO2 96%    Physical Exam  Constitutional: She appears well-developed and well-nourished. No distress.  HENT:  Head: Normocephalic and atraumatic.  Right Ear: Tympanic membrane and ear canal normal. Tympanic membrane is not erythematous.  Left Ear: Tympanic membrane and ear canal normal. Tympanic membrane is not erythematous.  Mouth/Throat: Uvula is midline and mucous membranes are normal. No oral lesions. No trismus in the jaw. No uvula swelling. Posterior oropharyngeal erythema present. No tonsillar exudate.  Eyes: Conjunctivae are normal.  Neck: Normal range of motion. Neck supple.  Cardiovascular: Normal rate and regular rhythm.  No murmur heard. Pulmonary/Chest: Effort normal and breath sounds normal. No respiratory distress. She has no wheezes. She has no rales.  Abdominal: Soft. There is no tenderness.  Musculoskeletal: She exhibits no edema.  Lymphadenopathy:    She has no cervical adenopathy.  Neurological: She is alert.  Skin: Skin is warm and dry.  Psychiatric: She has a normal mood and affect.  Nursing note and vitals reviewed.    UC Treatments / Results  Labs (all labs ordered are listed, but only abnormal results are displayed) Labs Reviewed - No data to display  EKG  EKG Interpretation None       Radiology Dg Chest 2 View  Result Date: 09/16/2017 CLINICAL DATA:  Cough congestion and short of breath EXAM: CHEST  2 VIEW COMPARISON:  05/15/2015 FINDINGS: Minimal atelectasis at the left base. No  consolidation or effusion. Normal cardiomediastinal silhouette with atherosclerosis. No pneumothorax. IMPRESSION: Minimal atelectasis at the left lung base. No focal pulmonary infiltrate is seen Electronically Signed   By: Donavan Foil M.D.   On: 09/16/2017 19:34    Procedures Procedures (including critical care time)  Medications Ordered in UC Medications - No data to display   Initial Impression / Assessment and Plan / UC Course  I have reviewed the triage vital signs and the nursing notes.  Pertinent labs & imaging results that were available during my care of the patient were reviewed by me and considered in my medical decision making (see chart for details).     No evidence of pneumonia on CXR. Patient presents with symptoms likely from a viral upper respiratory infection. Differential includes bacterial pneumonia, sinusitis, allergic rhinitis, acute bronchitis. Do not suspect underlying cardiopulmonary process. Symptoms seem unlikely related to ACS, CHF or COPD exacerbations, pneumonia, pneumothorax. Patient is nontoxic appearing and not in need of emergent medical intervention.  Recommended symptom control with over the counter medications: Daily oral anti-histamine, Oral decongestant or IN corticosteroid, saline irrigations, cepacol lozenges, Robitussin, Delsym, honey tea.  Patient is resistant and adamant that she needs an antibiotic. States she can take azithromycin safely. Discussed risk vs benefit of taking antibiotics when not necessary.   Return if symptoms fail to improve in 1-2 weeks or you develop shortness of breath, chest pain, severe headache. Patient states understanding and is agreeable.    Final Clinical Impressions(s) / UC Diagnoses   Final diagnoses:  Viral URI with cough    ED Discharge Orders        Ordered    benzonatate (TESSALON) 100 MG capsule  Every 8 hours     09/16/17 1938    azithromycin (ZITHROMAX) 250 MG tablet     09/16/17  1943        Controlled Substance Prescriptions [place] Controlled Substance Registry consulted? Not Applicable   Janith Lima, [place] 09/16/17 1946

## 2017-09-16 NOTE — Discharge Instructions (Signed)
You likely having a viral upper respiratory infection. We recommended symptom control. I expect your symptoms to start improving in the next 1-2 weeks.   1. Take a daily allergy pill/anti-histamine like Zyrtec, Claritin, or Store brand consistently for 2 weeks  2. For congestion you may try an oral decongestant like Mucinex or sudafed. You may also try intranasal flonase nasal spray or saline irrigations (neti pot, sinus cleanse)  3. For your sore throat you may try cepacol lozenges, salt water gargles, throat spray. Treatment of congestion may also help your sore throat.  4. For cough you may try Honey, Tessalon, Robitussen, Mucinex DM  5. Take Tylenol or Ibuprofen to help with pain/inflammation  6. Stay hydrated, drink plenty of fluids to keep throat coated and less irritated  Honey Tea For cough/sore throat try using a honey-based tea. Use 3 teaspoons of honey with juice squeezed from half lemon. Place shaved pieces of ginger into 1/2-1 cup of water and warm over stove top. Then mix the ingredients and repeat every 4 hours as needed.

## 2017-09-22 MED FILL — GABAPENTIN 300 MG CAPSULE: 300 | 30 days supply | Qty: 120 | Fill #5

## 2017-09-22 MED FILL — SPIRONOLACTONE 25 MG TABS: 25 | 30 days supply | Qty: 60 | Fill #3

## 2017-09-22 MED FILL — FUROSEMIDE 40 MG TAB: 40 | 30 days supply | Qty: 15 | Fill #4

## 2017-09-22 MED FILL — LISINOPRIL 40 MG TAB: 40 | 30 days supply | Qty: 30 | Fill #6

## 2017-09-22 MED FILL — ATORVASTATIN 40 MG TABLET: 40 | 30 days supply | Qty: 30 | Fill #5

## 2017-09-24 ENCOUNTER — Telehealth: Payer: Self-pay | Admitting: Internal Medicine

## 2017-09-24 ENCOUNTER — Other Ambulatory Visit: Payer: Self-pay

## 2017-09-24 MED ORDER — CARVEDILOL 12.5 MG PO TABS: 12.5000 mg | ORAL_TABLET | Freq: Two times a day (BID) | ORAL | 2 refills | Status: DC

## 2017-09-24 MED FILL — CARVEDILOL 12.5 MG TABLET: 12.5 | 30 days supply | Qty: 60 | Fill #0

## 2017-09-24 NOTE — Telephone Encounter (Signed)
Patient called asking for a refill on carvadol please fu

## 2017-09-24 NOTE — Telephone Encounter (Signed)
Pt medication has been refilled and pt has been aware

## 2017-09-28 ENCOUNTER — Encounter: Payer: Self-pay | Admitting: Internal Medicine

## 2017-09-28 ENCOUNTER — Ambulatory Visit: Payer: Medicaid Other | Attending: Internal Medicine | Admitting: Internal Medicine

## 2017-09-28 VITALS — BP 147/78 | HR 88 | Temp 98.2°F | Resp 16 | Wt 301.0 lb

## 2017-09-28 DIAGNOSIS — Z7982 Long term (current) use of aspirin: Secondary | ICD-10-CM | POA: Insufficient documentation

## 2017-09-28 DIAGNOSIS — I5032 Chronic diastolic (congestive) heart failure: Secondary | ICD-10-CM | POA: Insufficient documentation

## 2017-09-28 DIAGNOSIS — E1165 Type 2 diabetes mellitus with hyperglycemia: Secondary | ICD-10-CM | POA: Diagnosis not present

## 2017-09-28 DIAGNOSIS — Z794 Long term (current) use of insulin: Secondary | ICD-10-CM | POA: Diagnosis not present

## 2017-09-28 DIAGNOSIS — E039 Hypothyroidism, unspecified: Secondary | ICD-10-CM | POA: Diagnosis not present

## 2017-09-28 DIAGNOSIS — R05 Cough: Secondary | ICD-10-CM | POA: Diagnosis not present

## 2017-09-28 DIAGNOSIS — Z880 Allergy status to penicillin: Secondary | ICD-10-CM | POA: Diagnosis not present

## 2017-09-28 DIAGNOSIS — E1140 Type 2 diabetes mellitus with diabetic neuropathy, unspecified: Secondary | ICD-10-CM | POA: Insufficient documentation

## 2017-09-28 DIAGNOSIS — J069 Acute upper respiratory infection, unspecified: Secondary | ICD-10-CM | POA: Diagnosis not present

## 2017-09-28 DIAGNOSIS — R053 Chronic cough: Secondary | ICD-10-CM

## 2017-09-28 DIAGNOSIS — IMO0002 Reserved for concepts with insufficient information to code with codable children: Secondary | ICD-10-CM

## 2017-09-28 DIAGNOSIS — Z79899 Other long term (current) drug therapy: Secondary | ICD-10-CM | POA: Insufficient documentation

## 2017-09-28 DIAGNOSIS — G4733 Obstructive sleep apnea (adult) (pediatric): Secondary | ICD-10-CM | POA: Diagnosis not present

## 2017-09-28 DIAGNOSIS — Z6841 Body Mass Index (BMI) 40.0 and over, adult: Secondary | ICD-10-CM | POA: Diagnosis not present

## 2017-09-28 DIAGNOSIS — I110 Hypertensive heart disease with heart failure: Secondary | ICD-10-CM | POA: Diagnosis not present

## 2017-09-28 LAB — GLUCOSE, POCT (MANUAL RESULT ENTRY): POC GLUCOSE: 202 mg/dL — AB (ref 70–99)

## 2017-09-28 MED ORDER — GUAIFENESIN-CODEINE 100-10 MG/5ML PO SOLN: 5.0000 mL | Freq: Three times a day (TID) | ORAL | 0 refills | Status: DC | PRN

## 2017-09-28 MED FILL — BENZONATATE 100 MG CAPSULE: 100 | 2 days supply | Qty: 21 | Fill #0

## 2017-09-28 MED FILL — GUAIFENESIN AC COUGH SYRUP: 100-10 | 8 days supply | Qty: 120 | Fill #0

## 2017-09-28 NOTE — Progress Notes (Signed)
[name], female    DOB: [birth year]  MRN: [id number]  CC: chest congestion   Subjective: Anita James is a 67 y.o. female who presents for UC visit Her concerns today include:  Patient with history of diabetes type 2, hypertension, hypothyroidism, obstructive sleep apnea on CPAP, morbid obesity, diastolic CHF with EF of 83-81%.  1. Sick x 2-3 wks Coughing up green phlegm. SOB with coughing.  No fever. Seen at urgent care recently and had chest x-ray done which was negative.  She wasupper respiratory infection.  Patient insisted on having antibiotics so was prescribed Zithromax.  She completed the Zithromax but reports that she still has productive cough.  She thinks she may need another antibiotic.  2. DM:  Lantus and Novolog increased on last visit A.m BS running 90-130. Less than 200s during the day  Never received call from [place] with A1C results from last visit.  I went over it today.  Her A1c was increased compared to previous.  Her thyroid level was normal. She is working on improving eating habits but still discouraged about wgh gain Also has not heard from nutritionist.  In looking at the referral noted they did call her 12 /12/2016 but pt does not recall receiving a call  3.  Plans to change from [place] to another company for her CPAP supplies Patient Active Problem List   Diagnosis Date Noted  . OSA on CPAP 05/19/2017  . Morbid obesity due to excess calories ([place]) 03/17/2017  . Neuropathic pain 11/04/2016  . Carpal tunnel syndrome 06/14/2014  . 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 Cervical radiculopathy at C8 06/05/2014  . Hyperlipemia 09/12/2013  . Diastolic CHF ([place]) 84/11/7541  . Major depression 09/12/2013  . Allergic rhinitis 06/22/2013  . Chronic pain syndrome 05/31/2013  . Gastroesophageal reflux disease without esophagitis 08/30/2012  . Essential hypertension   . Hypothyroidism   . Type 2 diabetes, uncontrolled, with neuropathy ([place])       Current Outpatient Medications on File Prior to Visit  Medication Sig Dispense Refill  . aspirin EC 81 MG tablet Take 81 mg by mouth daily.    Marland Kitchen atorvastatin (LIPITOR) 40 MG tablet Take 1 tablet (40 mg total) by mouth daily at 6 PM. 90 tablet 3  . azithromycin (ZITHROMAX) 250 MG tablet Take first 2 tablets together today, then 1 every day until finished. 6 tablet 0  . benzonatate (TESSALON) 100 MG capsule Take 1 capsule (100 mg total) by mouth every 8 (eight) hours. 21 capsule 0  . Blood Glucose Monitoring Suppl (TRUE METRIX METER) w/Device KIT Use as instructed 1 kit 0  . Ca Carbonate-Mag Hydroxide 550-110 MG CHEW Chew 1 tablet by mouth 2 (two) times daily.    . carvedilol (COREG) 12.5 MG tablet Take 1 tablet (12.5 mg total) by mouth 2 (two) times daily with a meal. 180 tablet 2  . Cetirizine HCl 10 MG CAPS Take 1 capsule (10 mg total) by mouth daily. 30 capsule 0  . dexlansoprazole (DEXILANT) 60 MG capsule Take 1 capsule (60 mg total) by mouth daily. 90 capsule 0  . furosemide (LASIX) 20 MG tablet Take 1 tab PO 1-2 times a weeks as needed for lower extremity edema 30 tablet 0  . gabapentin (NEURONTIN) 300 MG capsule Take 2 capsules (600 mg total) by mouth 2 (two) times daily. 120 capsule 6  . glucose blood (TRUE METRIX BLOOD GLUCOSE TEST) test strip Check glucose twice a day 100  each 12  . insulin aspart (NOVOLOG) 100 UNIT/ML FlexPen Inject 33 Units into the skin 3 (three) times daily with meals. 30 mL 6  . Insulin Glargine (LANTUS SOLOSTAR) 100 UNIT/ML Solostar Pen Inject 74 Units into the skin daily at 10 pm. 5 pen 2  . levothyroxine (SYNTHROID, LEVOTHROID) 88 MCG tablet Take 1 tablet (88 mcg total) by mouth daily. 90 tablet 3  . liraglutide 18 MG/3ML SOPN 1.8 mg daily 18 mL 3  . lisdexamfetamine (VYVANSE) 40 MG capsule Take 1 capsule (40 mg total) by mouth every morning. 30 capsule 0  . lisinopril (PRINIVIL,ZESTRIL) 40 MG tablet Take 1 tablet (40 mg total) by mouth daily. 90 tablet 3    . mometasone (NASONEX) 50 MCG/ACT nasal spray Place 2 sprays into the nose daily. 17 g 3  . Multiple Vitamins-Minerals (EYE VITAMINS) CAPS Take 1 capsule by mouth daily.    . NYSTATIN powder APPLY TOPICALLY 4 TIMES DAILY. 15 g 0  . spironolactone (ALDACTONE) 25 MG tablet Take 1 tablet (25 mg total) by mouth daily. 180 tablet 3  . [DISCONTINUED] fenofibrate (TRICOR) 145 MG tablet Take 1 tablet (145 mg total) by mouth daily. 30 tablet 2  . [DISCONTINUED] insulin detemir (LEVEMIR) 100 UNIT/ML injection Inject 60 Units into the skin at bedtime. 10 mL 1  . [DISCONTINUED] metoprolol (LOPRESSOR) 50 MG tablet Take 1 tablet (50 mg total) by mouth 2 (two) times daily. 60 tablet 2   No current facility-administered medications on file prior to visit.     Allergies  Allergen Reactions  . Amoxicillin-Pot Clavulanate Nausea And Vomiting and Other (See Comments)    Combination of medications tear lining of stomach  . Hydrocodone-Acetaminophen Shortness Of Breath  . Oxycodone Shortness Of Breath    oxycontin too  . Sulfa Antibiotics Shortness Of Breath  . Azithromycin   . Norvasc [Amlodipine Besylate] Swelling    feet  . Sulfamethoxazole Nausea Only  . Tramadol Other (See Comments)    Unknown, toxicity. Patient advised she had to be given Narcan with this    Social History   Socioeconomic History  . Marital status: Divorced    Spouse name: Not on file  . Number of children: 6  . Years of education: BA  . Highest education level: Not on file  Social Needs  . Financial resource strain: Not on file  . Food insecurity - worry: Not on file  . Food insecurity - inability: Not on file  . Transportation needs - medical: Not on file  . Transportation needs - non-medical: Not on file  Occupational History  . Occupation: unemployed  Tobacco Use  . Smoking status: Never Smoker  . Smokeless tobacco: Never Used  Substance and Sexual Activity  . Alcohol use: No  . Drug use: No  . Sexual activity:  No  Other Topics Concern  . Not on file  Social History Narrative  . Not on file    Family History  Problem Relation Age of Onset  . Cancer Mother        cervical- mets  . Cancer Father   . Heart disease Father   . Diabetes Father   . Diabetes Sister   . Heart disease Sister   . Heart disease Sister   . COPD Sister     Past Surgical History:  Procedure Laterality Date  . CESAREAN SECTION    . KNEE SURGERY Left   . THYROID SURGERY     goiter    ROS: Review  of Systems Neg except as above  PHYSICAL EXAM: BP (!) 147/78   Pulse 88   Temp 98.2 F (36.8 C) (Oral)   Resp 16   Wt (!) 301 lb (136.5 kg)   SpO2 94%   BMI 47.14 kg/m   Wt Readings from Last 3 Encounters:  09/28/17 (!) 301 lb (136.5 kg)  08/17/17 300 lb (136.1 kg)  05/18/17 287 lb (130.2 kg)   Physical Exam  General appearance - alert, well appearing, and in no distress Mental status - alert, oriented to person, place, and time, normal mood, behavior, speech, dress, motor activity, and thought processes Chest - clear to auscultation, no wheezes, rales or rhonchi, symmetric air entry Heart - normal rate, regular rhythm, normal S1, S2, no murmurs, rubs, clicks or gallops Extremities - peripheral pulses normal, no pedal edema, no clubbing or cyanosis   Results for orders placed or performed in visit on 09/28/17  POCT glucose (manual entry)  Result Value Ref Range   POC Glucose 202 (A) 70 - 99 mg/dl    ASSESSMENT AND PLAN: 1. Upper respiratory tract infection, unspecified type 2. Persistent cough -post viral cough can last few wks. No need for more abx Given cough syrup with codeine to use prn  3. Type 2 diabetes, uncontrolled, with neuropathy (HCC) BS improving Will resub refill for nutritionist - POCT glucose (manual entry) - Amb ref to Medical Nutrition Therapy-MNT  4. Morbid obesity due to excess calories ([place]) Discussed good eating habits - Amb ref to Medical Nutrition  Therapy-MNT   Patient was given the opportunity to ask questions.  Patient verbalized understanding of the plan and was able to repeat key elements of the plan.   Orders Placed This Encounter  Procedures  . Amb ref to Medical Nutrition Therapy-MNT  . POCT glucose (manual entry)     Requested Prescriptions   Signed Prescriptions Disp Refills  . guaiFENesin-codeine 100-10 MG/5ML syrup 120 mL 0    Sig: Take 5 mLs by mouth 3 (three) times daily as needed for cough.    Return in about 3 months (around 12/27/2017).  Karle Plumber, MD, FACP

## 2017-09-28 NOTE — Patient Instructions (Signed)
Use the cough syrup as needed for the cough.  Continue to work on eating habits.  I have resubmitted referral to the nutritionist.  Follow a [place] can do it! Limit sugary drinks.  Avoid sodas, sweet tea, sport or energy drinks, or fruit drinks.  Drink water, lo-fat milk, or diet drinks. Limit snack foods.   Cut back on candy, cake, cookies, chips, ice cream.  These are a special treat, only in small amounts. Eat plenty of vegetables.  Especially dark green, red, and orange vegetables. Aim for at least 3 servings a day. More is better! Include fruit in your daily diet.  Whole fruit is much healthier than fruit juice! Limit "white" bread, "white" pasta, "white" rice.   Choose "100% whole grain" products, brown or wild rice. Avoid fatty meats. Try "Meatless Monday" and choose eggs or beans one day a week.  When eating meat, choose lean meats like chicken, [place], and fish.  Grill, broil, or bake meats instead of frying, and eat poultry without the skin. Eat less salt.  Avoid frozen pizzas, frozen dinners and salty foods.  Use seasonings other than salt in cooking.  This can help blood pressure and keep you from swelling Beer, wine and liquor have calories.  If you can safely drink alcohol, limit to 1 drink per day for women, 2 drinks for men

## 2017-10-06 ENCOUNTER — Ambulatory Visit: Payer: Medicaid Other | Admitting: Registered"

## 2017-10-07 ENCOUNTER — Ambulatory Visit: Payer: Medicaid Other | Admitting: Registered"

## 2017-10-12 ENCOUNTER — Telehealth: Payer: Self-pay | Admitting: Cardiovascular Disease

## 2017-10-12 NOTE — Telephone Encounter (Signed)
New message   Patient requesting to speak with a nurse regarding heart issues. Patient did not want to go into details with scheduler. Offered appointment, declined APP staff. Please call

## 2017-10-12 NOTE — Telephone Encounter (Signed)
Returned call to patient of Dr. Claiborne Billings. She received a recall letter stating she is due for a Feb appt with Dr. Claiborne Billings but no appts are available until April 2019. She refused APP appointment per scheduler's message and refused with myself as well, despite explaining that she could see APP and then f/up with MD, especially since she states she has concerns of SOB, weight gain of 60-70lbs d/t med changes, not sleeping well at night and uses CPAP. Informed her that I would send a message to [facility] in the event that there are cancellations, she may could be called for a sooner appointment.

## 2017-10-15 ENCOUNTER — Other Ambulatory Visit: Payer: Self-pay | Admitting: Pharmacist

## 2017-10-15 DIAGNOSIS — E1165 Type 2 diabetes mellitus with hyperglycemia: Principal | ICD-10-CM

## 2017-10-15 DIAGNOSIS — IMO0002 Reserved for concepts with insufficient information to code with codable children: Secondary | ICD-10-CM

## 2017-10-15 DIAGNOSIS — E1140 Type 2 diabetes mellitus with diabetic neuropathy, unspecified: Secondary | ICD-10-CM

## 2017-10-15 MED ORDER — LIRAGLUTIDE 18 MG/3ML ~~LOC~~ SOPN: PEN_INJECTOR | SUBCUTANEOUS | 3 refills | Status: DC

## 2017-10-15 MED ORDER — INSULIN GLARGINE 100 UNIT/ML SOLOSTAR PEN: 74.0000 [IU] | PEN_INJECTOR | Freq: Every day | SUBCUTANEOUS | 2 refills | Status: DC

## 2017-10-18 MED FILL — CARVEDILOL 12.5 MG TABLET: 12.5 | 30 days supply | Qty: 60 | Fill #1

## 2017-10-18 MED FILL — GABAPENTIN 300 MG CAPSULE: 300 | 30 days supply | Qty: 120 | Fill #6

## 2017-10-18 MED FILL — LISINOPRIL 40 MG TAB: 40 | 30 days supply | Qty: 30 | Fill #7

## 2017-10-19 MED FILL — SPIRONOLACTONE 25 MG TABS: 25 | 30 days supply | Qty: 60 | Fill #4

## 2017-10-19 MED FILL — FUROSEMIDE 40 MG TAB: 40 | 30 days supply | Qty: 15 | Fill #5

## 2017-10-19 MED FILL — ATORVASTATIN 40 MG TABLET: 40 | 30 days supply | Qty: 30 | Fill #6

## 2017-10-26 ENCOUNTER — Telehealth: Payer: Self-pay | Admitting: *Deleted

## 2017-10-26 NOTE — Telephone Encounter (Signed)
Left message for patient to call and schedule appointment 11/05/17 with Dr. Claiborne Billings per staff message from Patria Mane, RN

## 2017-10-27 ENCOUNTER — Other Ambulatory Visit: Payer: Self-pay | Admitting: Pharmacist

## 2017-10-27 ENCOUNTER — Telehealth: Payer: Self-pay | Admitting: Internal Medicine

## 2017-10-27 DIAGNOSIS — E1140 Type 2 diabetes mellitus with diabetic neuropathy, unspecified: Secondary | ICD-10-CM

## 2017-10-27 DIAGNOSIS — E1165 Type 2 diabetes mellitus with hyperglycemia: Principal | ICD-10-CM

## 2017-10-27 DIAGNOSIS — IMO0002 Reserved for concepts with insufficient information to code with codable children: Secondary | ICD-10-CM

## 2017-10-27 MED ORDER — INSULIN GLARGINE 100 UNIT/ML SOLOSTAR PEN: 74.0000 [IU] | PEN_INJECTOR | Freq: Every day | SUBCUTANEOUS | 2 refills | Status: DC

## 2017-10-27 MED FILL — LANTUS SOLOSTAR 100 UNITS/M: 100 | 20 days supply | Qty: 15 | Fill #0

## 2017-10-27 NOTE — Telephone Encounter (Signed)
Spoke to patient, has been scheduled to see Dr. Claiborne Billings 2/15 at [facility].  Patient aware and verbalized understanding.

## 2017-10-27 NOTE — Telephone Encounter (Signed)
Left message for patient to call and schedule appt with Dr. Kelly 

## 2017-10-27 NOTE — Telephone Encounter (Signed)
Contacted pt to get answer for Dr. Wynetta Emery questions and pt didn't answer lvm asking pt to give me a call at her earliest convenience

## 2017-10-27 NOTE — Telephone Encounter (Signed)
PC placed to pt today after I received form for adult diapers.  I did not leave message.

## 2017-10-28 ENCOUNTER — Telehealth: Payer: Self-pay | Admitting: Internal Medicine

## 2017-10-28 NOTE — Telephone Encounter (Signed)
Shanon Brow from [employer] called about paperwork faxed 10/25/17 regarding patients incontinent supplies. Shanon Brow was informed of the request for at least  2 weeks to complete any paperwork. Please fu.

## 2017-10-28 NOTE — Telephone Encounter (Signed)
Returned Shanon Brow call and was unable to reach him spoke with [facility] regarding paperwork. I informed her tht I am waiting for pt to give me a call so I can ask her a few questions that Dr. Wynetta Emery needs to know. I informed Melissa that pt didn't discuss this at her last visit. I informed Melissa that once we speak with pt and doctor just get the answers and sign the form I will be faxing it over

## 2017-10-28 NOTE — Telephone Encounter (Signed)
Pt came into the office today and Anita James will ask the questions for me and send the answer to pcp

## 2017-10-28 NOTE — Telephone Encounter (Signed)
bladder incontinence happens when she stands and when she sits also when she laughs and coughs for the last 6 months. She cannot hold her urine until she gets to the rest room.

## 2017-10-29 ENCOUNTER — Telehealth: Payer: Self-pay | Admitting: Pharmacist

## 2017-10-29 MED ORDER — ESOMEPRAZOLE MAGNESIUM 40 MG PO CPDR: 40.0000 mg | DELAYED_RELEASE_CAPSULE | Freq: Every day | ORAL | 3 refills | Status: DC

## 2017-10-29 NOTE — Telephone Encounter (Signed)
Attempted to call pt today.  No answer.  Will have CMA call her again later. 1.  Dexilant not covered by Medicaid.  Will have to change to Nexium 20 mg daily. 2.  Can no longer get Synthroid through Health Department.  Request through [place].  We only have the generic called Levothyroxine.  Thyroid level can fluctuate quite a bit when changing from brand name to generic.  Recommend that after being on the generic med for 1 mth, return as lab only visit for thyroid level check 3.  From answers to questions with CMA regarding urine incontinence, pt has stress and urge incontinence. Please find out from her how long she has had incontinence.

## 2017-10-29 NOTE — Addendum Note (Signed)
Addended by: Karle Plumber B on: 10/29/2017 12:14 PM   Modules accepted: Orders

## 2017-10-29 NOTE — Telephone Encounter (Signed)
Received fax from pharmacy for [place] for [place]. Patient used to not have insurance and was on the PASS program at the health department for [employer]. Now has Medicaid and Dexilant is not covered until preferred medications (esomeprazole, omeprazole, or pantoprazole) have failed. I reviewed her med history and she has only been on pantoprazole. Will forward to PCP for change.

## 2017-11-01 NOTE — Telephone Encounter (Signed)
Pt. Returned PCP call. Please f/u with pt.

## 2017-11-02 NOTE — Telephone Encounter (Signed)
Returned pt call and lvm asking pt to give me a call at her earliest convenience

## 2017-11-05 ENCOUNTER — Ambulatory Visit (INDEPENDENT_AMBULATORY_CARE_PROVIDER_SITE_OTHER): Payer: Medicaid Other | Admitting: Cardiovascular Disease

## 2017-11-05 ENCOUNTER — Encounter: Payer: Self-pay | Admitting: Cardiovascular Disease

## 2017-11-05 ENCOUNTER — Ambulatory Visit: Payer: Medicaid Other | Admitting: Cardiovascular Disease

## 2017-11-05 VITALS — BP 146/82 | HR 81 | Ht 66.0 in | Wt 298.0 lb

## 2017-11-05 DIAGNOSIS — G4733 Obstructive sleep apnea (adult) (pediatric): Secondary | ICD-10-CM | POA: Diagnosis not present

## 2017-11-05 DIAGNOSIS — I10 Essential (primary) hypertension: Secondary | ICD-10-CM | POA: Diagnosis not present

## 2017-11-05 DIAGNOSIS — E785 Hyperlipidemia, unspecified: Secondary | ICD-10-CM | POA: Diagnosis not present

## 2017-11-05 DIAGNOSIS — E039 Hypothyroidism, unspecified: Secondary | ICD-10-CM | POA: Diagnosis not present

## 2017-11-05 DIAGNOSIS — M25471 Effusion, right ankle: Secondary | ICD-10-CM

## 2017-11-05 DIAGNOSIS — I5032 Chronic diastolic (congestive) heart failure: Secondary | ICD-10-CM | POA: Diagnosis not present

## 2017-11-05 DIAGNOSIS — M25472 Effusion, left ankle: Secondary | ICD-10-CM | POA: Diagnosis not present

## 2017-11-05 MED ORDER — SPIRONOLACTONE 25 MG PO TABS: ORAL_TABLET | ORAL | 3 refills | Status: DC

## 2017-11-05 MED ORDER — CARVEDILOL 12.5 MG PO TABS: ORAL_TABLET | ORAL | 3 refills | Status: DC

## 2017-11-05 MED FILL — SPIRONOLACTONE 25 MG TABS: 25 | 30 days supply | Qty: 45 | Fill #0

## 2017-11-05 MED FILL — CARVEDILOL 12.5 MG TABLET: 12.5 | 30 days supply | Qty: 75 | Fill #0

## 2017-11-05 NOTE — Progress Notes (Signed)
Cardiology Office Note    Date:  11/07/2017   ID:  Anita James, DOB 12/01/1950, MRN [id number]  PCP:  Ladell Pier, MD  Cardiologist:  Shelva Majestic, MD   Chief Complaint  [name]  . Shortness of Breath    Most of the time.  . Chest Pain  . Edema    Legs and feet.   Initially referred by Dr. Clide Dales for cardiology consultation for shortness of breath and diastolic heart failure.  History of Present Illness:  Mariaha James is a 67 y.o. female who was referred by Dr. Janne Napoleon for cardiology consultation for increasing episodes of shortness of breath and possible heart failure. I saw her for initial evaluation in May 2018 with f/u in August 2018.  She presents for a six-month follow-up evaluation.    Ms. Much has a history of hypertension, diabetes mellitus, hypothyroidism, obstructive sleep apnea for which she's on CPAP therapy, and hyperlipidemia.  She is been cared for by Dr. Janne Napoleon at the [facility].  She has had issues in the past with atypical chest pain.  Reportedly in 2013.  Ejection fraction was 55-60%.  In June 2017 an echo Doppler study was done which showed an EF of 65-70% with mild LVH and grade 1 diastolic dysfunction.  There was mitral annular calcification.  No significant additional valvular abnormalities.  She has experienced some episodic left turning and sharp chest pain and has had several emergency room evaluations.  She has also been evaluated at [facility]. An ultrasound of her abdomen showed hepatic steatosis.  She had  noticed mild shortness of breath particularly with step climbing.  She admits to a 60 pound weight gain over the past year.  She denied any exertional precipitation of chest tightness.  She denied PND, orthopnea. When I initially saw her, she was having somewhat sharp atypical chest pain which I did not feel was ischemic in etiology.  Her blood pressure was elevated despite being on furosemide, lisinopril 40 mg and  Toprol-XL 20 mg daily.  With diastolic dysfunction, I initiated spironolactone initially at 12.5 mg for one week and then increase this to twice a day.  I felt her weight gain undoubtedly contributed to sleep apnea and  diabetes mellitus.  On exam she also had a right eye cataract for which she was unaware of.  He subsequently saw an ophthalmologist and apparently underwent cataract surgery by Dr. Carolynn Sayers.  A follow-up echo Doppler study on 02/26/2017  showed an EF of 16-94%; grade 1 diastolic dysfunction, trivial MR and TR, and mild left atrial dilation.  She has felt somewhat improved with reference to shortness of breath.    Since I last saw her, she has not been successful with weight loss and admits to a 60 pound weight gain in one year. .  She admits to swelling, particularly as the day progresses in her lower extremities bilaterally.  She has obstructive sleep apnea and is on CPAP therapy.  She denies recent chest pain.  She is unaware of palpitations.  She is diabetic and hypothyroid.  She presents for reevaluation.  Past Medical History:  Diagnosis Date  . Anxiety   . Carpal tunnel syndrome 06/14/2014  . Chest pain   . CHF (congestive heart failure) ([place])   . Chronic back pain   . Chronic kidney disease 03/2013   failure due to meds  . Diabetes mellitus type II, uncontrolled ([place])   . Diverticulitis   . Frequent headaches   .  Hiatal hernia   . Hyperlipemia   . Hypertension   . Hypothyroidism   . 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 Neuropathy   . Obesity   . Pneumonia   . Vitamin D deficiency 06/05/2014    Past Surgical History:  Procedure Laterality Date  . CESAREAN SECTION    . KNEE SURGERY Left   . THYROID SURGERY     goiter    Current Medications: Outpatient Medications Prior to Visit  Medication Sig Dispense Refill  . aspirin EC 81 MG tablet Take 81 mg by mouth daily.    Marland Kitchen atorvastatin (LIPITOR) 40 MG tablet Take 1 tablet (40 mg total) by mouth daily at 6 PM. 90 tablet  3  . Blood Glucose Monitoring Suppl (TRUE METRIX METER) w/Device KIT Use as instructed 1 kit 0  . Cetirizine HCl 10 MG CAPS Take 1 capsule (10 mg total) by mouth daily. 30 capsule 0  . dexlansoprazole (DEXILANT) 60 MG capsule Take 1 capsule (60 mg total) by mouth daily. 90 capsule 0  . furosemide (LASIX) 20 MG tablet Take 1 tab PO 1-2 times a weeks as needed for lower extremity edema 30 tablet 0  . gabapentin (NEURONTIN) 300 MG capsule Take 2 capsules (600 mg total) by mouth 2 (two) times daily. 120 capsule 6  . glucose blood (TRUE METRIX BLOOD GLUCOSE TEST) test strip Check glucose twice a day 100 each 12  . guaiFENesin-codeine 100-10 MG/5ML syrup Take 5 mLs by mouth 3 (three) times daily as needed for cough. 120 mL 0  . insulin aspart (NOVOLOG) 100 UNIT/ML FlexPen Inject 33 Units into the skin 3 (three) times daily with meals. 30 mL 6  . Insulin Glargine (LANTUS SOLOSTAR) 100 UNIT/ML Solostar Pen Inject 74 Units into the skin daily at 10 pm. 5 pen 2  . levothyroxine (SYNTHROID, LEVOTHROID) 88 MCG tablet Take 1 tablet (88 mcg total) by mouth daily. 90 tablet 3  . liraglutide (VICTOZA) 18 MG/3ML SOPN 1.8 mg daily 18 mL 3  . lisdexamfetamine (VYVANSE) 40 MG capsule Take 1 capsule (40 mg total) by mouth every morning. 30 capsule 0  . lisinopril (PRINIVIL,ZESTRIL) 40 MG tablet Take 1 tablet (40 mg total) by mouth daily. 90 tablet 3  . mometasone (NASONEX) 50 MCG/ACT nasal spray Place 2 sprays into the nose daily. 17 g 3  . Multiple Vitamins-Minerals (EYE VITAMINS) CAPS Take 1 capsule by mouth daily.    . NYSTATIN powder APPLY TOPICALLY 4 TIMES DAILY. 15 g 0  . azithromycin (ZITHROMAX) 250 MG tablet Take first 2 tablets together today, then 1 every day until finished. 6 tablet 0  . benzonatate (TESSALON) 100 MG capsule Take 1 capsule (100 mg total) by mouth every 8 (eight) hours. 21 capsule 0  . Ca Carbonate-Mag Hydroxide 550-110 MG CHEW Chew 1 tablet by mouth 2 (two) times daily.    . carvedilol  (COREG) 12.5 MG tablet Take 1 tablet (12.5 mg total) by mouth 2 (two) times daily with a meal. 180 tablet 2  . esomeprazole (NEXIUM) 40 MG capsule Take 1 capsule (40 mg total) by mouth daily. 30 capsule 3  . spironolactone (ALDACTONE) 25 MG tablet Take 1 tablet (25 mg total) by mouth daily. 180 tablet 3   No facility-administered medications prior to visit.      Allergies:   Amoxicillin-pot clavulanate; Hydrocodone-acetaminophen; Oxycodone; Sulfa antibiotics; Azithromycin; Norvasc [amlodipine besylate]; Sulfamethoxazole; and Tramadol   Social History   Socioeconomic History  . Marital status: Divorced  Spouse name: None  . Number of children: 6  . Years of education: BA  . Highest education level: None  Social Needs  . Financial resource strain: None  . Food insecurity - worry: None  . Food insecurity - inability: None  . Transportation needs - medical: None  . Transportation needs - non-medical: None  Occupational History  . Occupation: unemployed  Tobacco Use  . Smoking status: Never Smoker  . Smokeless tobacco: Never Used  Substance and Sexual Activity  . Alcohol use: No  . Drug use: No  . Sexual activity: No  Other Topics Concern  . None  Social History Narrative  . None    Additional slow, so history is notable that she is divorced for 31 years.  She has 6 children and 10 grandchildren.  She works in [place] for [employer].  There is no tobacco history.  She does not drink alcohol.  She does not exercise.  Family History:  The patient's family history includes COPD in her sister; Cancer in her father and mother; Diabetes in her father and sister; Heart disease in her father, sister, and sister.  Her mother died at age 24 with cancer.  Her father died and had hypertension and cancer.  She has 4 sisters who are deceased and one sister who is living.   ROS General: Negative; No fevers, chills, or night sweats; Positive for weight gain HEENT: Positive for cataract  surgery on 03/22/2017; No changes in vision or hearing, sinus congestion, difficulty swallowing Pulmonary: Negative; No cough, wheezing, shortness of breath, hemoptysis Cardiovascular: see HPI GI: Negative; positive for GERD GU: Negative; No dysuria, hematuria, or difficulty voiding Musculoskeletal: Negative; no myalgias, joint pain, or weakness Hematologic/Oncology: Negative; no easy bruising, bleeding Endocrine: Positive for diabetes mellitus on insulin, and hypothyroidism Neuro: Positive for peripheral neuropathy Skin: Negative; No rashes or skin lesions Psychiatric:Positive for anxiety Sleep: Positive for OSA, no daytime sleepiness, hypersomnolence, bruxism, restless legs, hypnogognic hallucinations, no cataplexy Other comprehensive 14 point system review is negative.   PHYSICAL EXAM:   BP (!) 146/82   Pulse 81   Ht 5' 6" (1.676 m)   Wt 298 lb (135.2 kg)   BMI 48.10 kg/m   Repeat blood pressure by me was 130/82  Wt Readings from Last 3 Encounters:  11/05/17 298 lb (135.2 kg)  09/28/17 (!) 301 lb (136.5 kg)  08/17/17 300 lb (136.1 kg)    General: Alert, oriented, no distress.  Skin: normal turgor, no rashes, warm and dry HEENT: Normocephalic, atraumatic. Pupils equal round and reactive to light; sclera anicteric; extraocular muscles intact; Nose without nasal septal hypertrophy Mouth/Parynx benign; Mallinpatti scale 3 Neck: No JVD, no carotid bruits; normal carotid upstroke Lungs: clear to ausculatation and percussion; no wheezing or rales Chest wall: without tenderness to palpitation Heart: PMI not displaced, RRR, s1 s2 normal, 1/6 systolic murmur, no diastolic murmur, no rubs, gallops, thrills, or heaves Abdomen: Moderate central adiposity; soft, nontender; no hepatosplenomehaly, BS+; abdominal aorta nontender and not dilated by palpation. Back: no CVA tenderness Pulses 2+ Musculoskeletal: full range of motion, normal strength, no joint deformities Extremities: Trace  to 1+ edema bilaterally no clubbing cyanosis, Homan's sign negative  Neurologic: grossly nonfocal; Cranial nerves grossly wnl Psychologic: Normal mood and affect   Studies/Labs Reviewed:   EKG:  EKG is ordered today. ECG (independently read by me): Normal sinus rhythm at 81.  Poor progression anteriorly.  No ectopy.  Normal intervals.  August 2018 ECG (independently read by me):Normal  sinus rhythm at 90 bpm.  Borderline first-degree AV block with a PR interval 22 ms.  May 2018 ECG (independently read by me):Normal sinus rhythm at 88 bpm.  No ectopy.  QTc interval normal.  Recent Labs: BMP Latest Ref Rng & Units 06/16/2017 05/18/2017 02/24/2017  Glucose 65 - 99 mg/dL 192(H) 114(H) 69  BUN 8 - 27 mg/dL _0 Creatinine 0.57 - 1.00 mg/dL 0.90 1.13(H) 0.82  BUN/Creat Ratio 12 - _1 Sodium 134 - 144 mmol/L 141 142 147(H)  Potassium 3.5 - 5.2 mmol/L 4.2 4.3 4.5  Chloride 96 - 106 mmol/L 101 98 104  CO2 20 - 29 mmol/L _2 Calcium 8.7 - 10.3 mg/dL 8.8 9.1 9.5     Hepatic Function Latest Ref Rng & Units 05/18/2017 01/13/2016 12/23/2013  Total Protein 6.0 - 8.5 g/dL 7.3 6.5 7.1  Albumin 3.6 - 4.8 g/dL 4.4 4.1 3.5  AST 0 - 40 IU/L _3 ALT 0 - 32 IU/L _4 Alk Phosphatase 39 - 117 IU/L 68 60 56  Total Bilirubin 0.0 - 1.2 mg/dL 0.6 0.4 0.3    CBC Latest Ref Rng & Units 05/18/2017 01/18/2017 12/23/2013  WBC 3.4 - 10.8 x10E3/uL 5.6 7.5 5.3  Hemoglobin 11.1 - 15.9 g/dL 12.8 12.2 14.2  Hematocrit 34.0 - 46.6 % 38.3 36.3 40.4  Platelets 150 - 379 x10E3/uL 215 221 189   Lab Results  Component Value Date   MCV 91 05/18/2017   MCV 91 01/18/2017   MCV 87.1 12/23/2013   Lab Results  Component Value Date   TSH 1.620 08/17/2017   Lab Results  Component Value Date   HGBA1C 9.3 (H) 08/17/2017     BNP    Component Value Date/Time   BNP 9.6 12/28/2016 1222   BNP 10.1 11/25/2016 1115    ProBNP    Component Value Date/Time   PROBNP CANCELED 03/09/2016 1703      Lipid Panel     Component Value Date/Time   CHOL 110 11/25/2016 1115   TRIG 169 (H) 11/25/2016 1115   HDL 36 (L) 11/25/2016 1115   CHOLHDL 3.1 11/25/2016 1115   VLDL 34 (H) 11/25/2016 1115   LDLCALC 40 11/25/2016 1115     RADIOLOGY: No results found.   Additional studies/ records that were reviewed today include:  I reviewed prior ER records, laboratory, prior echo Doppler study, recent ultrasound at [facility].  I also reviewed the recent echo Doppler study from 03/08/2017.    ASSESSMENT:    1. Essential hypertension   2. Chronic diastolic congestive heart failure ([place])   3. Morbid obesity ([place])   4. OSA (obstructive sleep apnea)   5. Hyperlipidemia with target LDL less than 70   6. Hypothyroidism, unspecified type   7. Ankle edema, bilateral    PLAN:  Ms. Saleha Kalp is a 67 year old Caucasian female who has a history of morbid obesity, hypertension, type 2 diabetes mellitus on insulin, hypothyroidism, hyperlipidemia, and obstructive sleep apnea on CPAP therapy.  She also has been found to have hepatic steatosis.  Remotely, she was found to have normal systolic function and is felt to have chronic diastolic dysfunction.  When I initially saw her, she was complaining of shortness of breath with walking, and particularly if step climbing.  She had atypical chest pain features which I did not believe or skin making etiology.  Spiral lactone was added for diastolic dysfunction.  She has not  been successful with weight loss and has gained over 60 pounds over the past year.  Her most recent echo Doppler study in June 2018 reconfirmed normal systolic function with grade 1 diastolic dysfunction, mild left atrial enlargement, and trivial MR and TR.  She is diabetic.  We discussed optimal blood pressure; her blood pressure is elevated today.  She continues to be on carvedilol 12.5 mg twice a day, lisinopril 40 mg daily, and spironolactone 25 mg.  I have recommended she increase  spironolactone and take 25 mg in the morning and 12.5 mg at night.  She states her legs, typically are significantly swollen by the end of the day.  Remotely, was found to have minimal increasing creatinine.  She was increased to 25 mg twice a day.  I'm further increasing carvedilol to 18.75 mg in the morning and she will continue with 12.5 mg at night.  We discussed exercise at least 5 days a week up to 30 minutes.  She will undergo follow-up laboratory with her primary physician.  She continues to be on levothyroxine 88 g for hypothyroidism.  She is diabetic on insulin and Victoza.  If her diabetes is not well controlled.  She may be an candidate for SGL2 inhibition.  I will see her in 6 months for reevaluation.  Medication Adjustments/Labs and Tests Ordered: Current medicines are reviewed at length with the patient today.  Concerns regarding medicines are outlined above.  Medication changes, Labs and Tests ordered today are listed in the Patient Instructions below. Patient Instructions  Medication Instructions: INCREASE the spironolactone to 25 mg tablet in the morning and then half a tablet, 12.5 mg, in the afternoon. INCREASE the Carvedilol to 18.75 mg (a tablet and a half) in the morning and then 12.5 mg in the evening.  If you need a refill on your cardiac medications before your next appointment, please call your pharmacy.   Follow-Up: Your physician wants you to follow-up in: 6 months with Dr. Claiborne Billings. You will receive a reminder letter in the mail two months in advance. If you don't receive a letter, please call our office at [phone] to schedule this follow-up appointment.   Thank you for choosing Heartcare at [facility]!!         Signed, Shelva Majestic, MD  11/07/2017 10:39 AM    [place] [address], [place], [place], [place]  [address] Phone: [phone]

## 2017-11-05 NOTE — Patient Instructions (Signed)
Medication Instructions: INCREASE the spironolactone to 25 mg tablet in the morning and then half a tablet, 12.5 mg, in the afternoon. INCREASE the Carvedilol to 18.75 mg (a tablet and a half) in the morning and then 12.5 mg in the evening.  If you need a refill on your cardiac medications before your next appointment, please call your pharmacy.   Follow-Up: Your physician wants you to follow-up in: 6 months with Dr. Claiborne Billings. You will receive a reminder letter in the mail two months in advance. If you don't receive a letter, please call our office at [phone] to schedule this follow-up appointment.   Thank you for choosing Heartcare at [facility]!!

## 2017-11-07 ENCOUNTER — Encounter: Payer: Self-pay | Admitting: Cardiovascular Disease

## 2017-11-08 NOTE — Telephone Encounter (Signed)
Pt has an appointment tomorrow 11/09/17 with pcp

## 2017-11-09 ENCOUNTER — Encounter: Payer: Self-pay | Admitting: Internal Medicine

## 2017-11-09 ENCOUNTER — Ambulatory Visit: Payer: Medicaid Other | Attending: Internal Medicine | Admitting: Internal Medicine

## 2017-11-09 VITALS — BP 143/82 | HR 90 | Temp 98.6°F | Resp 16 | Ht 66.0 in | Wt 304.8 lb

## 2017-11-09 DIAGNOSIS — G894 Chronic pain syndrome: Secondary | ICD-10-CM | POA: Diagnosis not present

## 2017-11-09 DIAGNOSIS — E785 Hyperlipidemia, unspecified: Secondary | ICD-10-CM | POA: Insufficient documentation

## 2017-11-09 DIAGNOSIS — G4733 Obstructive sleep apnea (adult) (pediatric): Secondary | ICD-10-CM | POA: Insufficient documentation

## 2017-11-09 DIAGNOSIS — I5032 Chronic diastolic (congestive) heart failure: Secondary | ICD-10-CM | POA: Diagnosis not present

## 2017-11-09 DIAGNOSIS — I10 Essential (primary) hypertension: Secondary | ICD-10-CM

## 2017-11-09 DIAGNOSIS — N3941 Urge incontinence: Secondary | ICD-10-CM

## 2017-11-09 DIAGNOSIS — Z7982 Long term (current) use of aspirin: Secondary | ICD-10-CM | POA: Diagnosis not present

## 2017-11-09 DIAGNOSIS — E1165 Type 2 diabetes mellitus with hyperglycemia: Secondary | ICD-10-CM | POA: Diagnosis not present

## 2017-11-09 DIAGNOSIS — Z794 Long term (current) use of insulin: Secondary | ICD-10-CM | POA: Insufficient documentation

## 2017-11-09 DIAGNOSIS — I110 Hypertensive heart disease with heart failure: Secondary | ICD-10-CM | POA: Insufficient documentation

## 2017-11-09 DIAGNOSIS — M79671 Pain in right foot: Secondary | ICD-10-CM | POA: Diagnosis not present

## 2017-11-09 DIAGNOSIS — M5412 Radiculopathy, cervical region: Secondary | ICD-10-CM | POA: Insufficient documentation

## 2017-11-09 DIAGNOSIS — R21 Rash and other nonspecific skin eruption: Secondary | ICD-10-CM | POA: Diagnosis present

## 2017-11-09 DIAGNOSIS — G5600 Carpal tunnel syndrome, unspecified upper limb: Secondary | ICD-10-CM | POA: Diagnosis not present

## 2017-11-09 DIAGNOSIS — Z79899 Other long term (current) drug therapy: Secondary | ICD-10-CM | POA: Insufficient documentation

## 2017-11-09 DIAGNOSIS — K219 Gastro-esophageal reflux disease without esophagitis: Secondary | ICD-10-CM | POA: Diagnosis not present

## 2017-11-09 DIAGNOSIS — IMO0002 Reserved for concepts with insufficient information to code with codable children: Secondary | ICD-10-CM

## 2017-11-09 DIAGNOSIS — E890 Postprocedural hypothyroidism: Secondary | ICD-10-CM | POA: Diagnosis not present

## 2017-11-09 DIAGNOSIS — F329 Major depressive disorder, single episode, unspecified: Secondary | ICD-10-CM | POA: Diagnosis not present

## 2017-11-09 DIAGNOSIS — E1140 Type 2 diabetes mellitus with diabetic neuropathy, unspecified: Secondary | ICD-10-CM | POA: Diagnosis not present

## 2017-11-09 LAB — POCT URINALYSIS DIPSTICK
Bilirubin, UA: NEGATIVE
GLUCOSE UA: 500
Ketones, UA: NEGATIVE
Leukocytes, UA: NEGATIVE
Nitrite, UA: NEGATIVE
PH UA: 5.5 (ref 5.0–8.0)
Protein, UA: NEGATIVE
RBC UA: NEGATIVE
UROBILINOGEN UA: 0.2 U/dL

## 2017-11-09 LAB — GLUCOSE, POCT (MANUAL RESULT ENTRY)
POC GLUCOSE: 375 mg/dL — AB (ref 70–99)
POC Glucose: 401 mg/dl — AB (ref 70–99)
POC Glucose: 411 mg/dl — AB (ref 70–99)

## 2017-11-09 MED ORDER — SITAGLIPTIN PHOSPHATE 50 MG PO TABS: 50.0000 mg | ORAL_TABLET | Freq: Every day | ORAL | 4 refills | Status: DC

## 2017-11-09 MED ORDER — LEVOTHYROXINE SODIUM 100 MCG PO TABS: 100.0000 ug | ORAL_TABLET | Freq: Every day | ORAL | 6 refills | Status: DC

## 2017-11-09 MED ORDER — INSULIN ASPART 100 UNIT/ML ~~LOC~~ SOLN
6.0000 [IU] | Freq: Once | SUBCUTANEOUS | Status: AC
  Administered 2017-11-09: 6 [IU] via SUBCUTANEOUS

## 2017-11-09 MED ORDER — INSULIN ASPART 100 UNIT/ML FLEXPEN
PEN_INJECTOR | SUBCUTANEOUS | 6 refills | Status: DC
Start: 2017-11-09 — End: 2018-01-14

## 2017-11-09 MED ORDER — INSULIN ASPART 100 UNIT/ML FLEXPEN: PEN_INJECTOR | SUBCUTANEOUS | 6 refills | Status: DC

## 2017-11-09 MED ORDER — INSULIN GLARGINE 100 UNIT/ML SOLOSTAR PEN: 76.0000 [IU] | PEN_INJECTOR | Freq: Every day | SUBCUTANEOUS | 2 refills | Status: DC

## 2017-11-09 NOTE — Patient Instructions (Signed)
  Your diabetes is not well controlled.  We have drawn blood to check your A1c.  I will get back to you with the results.  In the meantime increase Lantus to 74 units daily.  Start Januvia 50 mg daily.  We have changed NovoLog to 33 units in the mornings with breakfast and 37 units with lunch and dinner.  I have referred you to the endocrinologist for help with getting your diabetes under better control.  Continue to check blood sugars 3 times a day with meals and bring in your readings on the next visit.  Consider going to the [facility] to do water aerobics 2-3 times a week.  I have printed the prescription for the Synthroid.  You can take it to any outside pharmacy.

## 2017-11-09 NOTE — Progress Notes (Signed)
[name], female    DOB: 04-Sep-1951  MRN: [id number]  CC: Rash (Red spots back right leg) and Weight Gain (6lbs)   Subjective: Anita James is a 67 y.o. female who presents for Her concerns today include:  Patient with history of diabetes type 2, hypertension, hypothyroidism, obstructive sleep apnea on CPAP, morbid obesity, diastolic CHF with EF of 95-28%.  Presents today with some health concerns 1.  DM: Reports checking BS BID -before breakfast 90-140; b/w 4-6 p.m - up to 250.  Trying to do better with food choices.  Had Peanut butter, banana and cheese and crackers today for lunch before coming in. BS >400 here today.  She did not take her Novolog 33 units as yet but took it after she got this reading in clinic today Taking Lantus 74 in a.m and Novolog 33-34 units TID  -Eating habits: "good and bad."  Has not heard for nutritionist as yet Not getting in much exercise.  She continues to stress about weight gain.  States that cardiologist told her to speak with me about getting into an exercise program  2.  Thyroid:  Would like to get Synthroid from our pharmacy.  We only carry generic.  Compliant with Med  3.  CHF:  Saw Dr. Claiborne Billings recently.  Spironolactone increased. Mild LE edmea No PND.  No wheezing at nights.   Some SOB when walking to car  Urinary incontinence: I received a form for him incontinence supplies.  She is unable to hold her urine before she gets to the bathroom.  Occasional leakage with coughing and sneezing. she has had incontinence of urine for the past 6 months.  Patient Active Problem List   Diagnosis Date Noted  . OSA on CPAP 05/19/2017  . Morbid obesity due to excess calories ([place]) 03/17/2017  . Neuropathic pain 11/04/2016  . Carpal tunnel syndrome 06/14/2014  . 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 Cervical radiculopathy at C8 06/05/2014  . Hyperlipemia 09/12/2013  . Diastolic CHF ([place]) 41/32/4401  . Major depression 09/12/2013  .  Allergic rhinitis 06/22/2013  . Chronic pain syndrome 05/31/2013  . Gastroesophageal reflux disease without esophagitis 08/30/2012  . Essential hypertension   . Hypothyroidism   . Type 2 diabetes, uncontrolled, with neuropathy ([place])      Current Outpatient Medications on File Prior to Visit  Medication Sig Dispense Refill  . aspirin EC 81 MG tablet Take 81 mg by mouth daily.    Marland Kitchen atorvastatin (LIPITOR) 40 MG tablet Take 1 tablet (40 mg total) by mouth daily at 6 PM. 90 tablet 3  . Blood Glucose Monitoring Suppl (TRUE METRIX METER) w/Device KIT Use as instructed 1 kit 0  . carvedilol (COREG) 12.5 MG tablet Take a tablet and a half, 18.75 mg, in the morning and one tablet, 12.5 mg, at night. 225 tablet 3  . Cetirizine HCl 10 MG CAPS Take 1 capsule (10 mg total) by mouth daily. 30 capsule 0  . dexlansoprazole (DEXILANT) 60 MG capsule Take 1 capsule (60 mg total) by mouth daily. 90 capsule 0  . furosemide (LASIX) 20 MG tablet Take 1 tab PO 1-2 times a weeks as needed for lower extremity edema 30 tablet 0  . gabapentin (NEURONTIN) 300 MG capsule Take 2 capsules (600 mg total) by mouth 2 (two) times daily. 120 capsule 6  . glucose blood (TRUE METRIX BLOOD GLUCOSE TEST) test strip Check glucose twice a day 100 each 12  . guaiFENesin-codeine 100-10 MG/5ML  syrup Take 5 mLs by mouth 3 (three) times daily as needed for cough. (Patient not taking: Reported on 11/09/2017) 120 mL 0  . liraglutide (VICTOZA) 18 MG/3ML SOPN 1.8 mg daily 18 mL 3  . lisdexamfetamine (VYVANSE) 40 MG capsule Take 1 capsule (40 mg total) by mouth every morning. 30 capsule 0  . lisinopril (PRINIVIL,ZESTRIL) 40 MG tablet Take 1 tablet (40 mg total) by mouth daily. 90 tablet 3  . mometasone (NASONEX) 50 MCG/ACT nasal spray Place 2 sprays into the nose daily. 17 g 3  . Multiple Vitamins-Minerals (EYE VITAMINS) CAPS Take 1 capsule by mouth daily.    . NYSTATIN powder APPLY TOPICALLY 4 TIMES DAILY. 15 g 0  . spironolactone (ALDACTONE)  25 MG tablet Take 25 mg, one tablet, in the morning and then 12.5 mg, half a tablet, in the afternoon. 135 tablet 3  . [DISCONTINUED] fenofibrate (TRICOR) 145 MG tablet Take 1 tablet (145 mg total) by mouth daily. 30 tablet 2  . [DISCONTINUED] insulin detemir (LEVEMIR) 100 UNIT/ML injection Inject 60 Units into the skin at bedtime. 10 mL 1  . [DISCONTINUED] metoprolol (LOPRESSOR) 50 MG tablet Take 1 tablet (50 mg total) by mouth 2 (two) times daily. 60 tablet 2   No current facility-administered medications on file prior to visit.     Allergies  Allergen Reactions  . Amoxicillin-Pot Clavulanate Nausea And Vomiting and Other (See Comments)    Combination of medications tear lining of stomach  . Hydrocodone-Acetaminophen Shortness Of Breath  . Oxycodone Shortness Of Breath    oxycontin too  . Sulfa Antibiotics Shortness Of Breath  . Azithromycin   . Norvasc [Amlodipine Besylate] Swelling    feet  . Sulfamethoxazole Nausea Only  . Tramadol Other (See Comments)    Unknown, toxicity. Patient advised she had to be given Narcan with this    Social History   Socioeconomic History  . Marital status: Divorced    Spouse name: Not on file  . Number of children: 6  . Years of education: BA  . Highest education level: Not on file  Social Needs  . Financial resource strain: Not on file  . Food insecurity - worry: Not on file  . Food insecurity - inability: Not on file  . Transportation needs - medical: Not on file  . Transportation needs - non-medical: Not on file  Occupational History  . Occupation: unemployed  Tobacco Use  . Smoking status: Never Smoker  . Smokeless tobacco: Never Used  Substance and Sexual Activity  . Alcohol use: No  . Drug use: No  . Sexual activity: No  Other Topics Concern  . Not on file  Social History Narrative  . Not on file    Family History  Problem Relation Age of Onset  . Cancer Mother        cervical- mets  . Cancer Father   . Heart disease  Father   . Diabetes Father   . Diabetes Sister   . Heart disease Sister   . Heart disease Sister   . COPD Sister     Past Surgical History:  Procedure Laterality Date  . CESAREAN SECTION    . KNEE SURGERY Left   . THYROID SURGERY     goiter    ROS: Review of Systems Complains of protruding bone with soreness on the lateral portion of the right foot PHYSICAL EXAM: BP (!) 143/82   Pulse 90   Temp 98.6 F (37 C) (Oral)   Resp  16   Ht 5' 6" (1.676 m)   Wt (!) 304 lb 12.8 oz (138.3 kg)   SpO2 96%   BMI 49.20 kg/m   Physical Exam  General appearance - alert, well appearing, and in no distress Mental status -patient tearful about her blood sugars Neck - supple, no significant adenopathy Chest - clear to auscultation, no wheezes, rales or rhonchi, symmetric air entry Heart - normal rate, regular rhythm, normal S1, S2, no murmurs, rubs, clicks or gallops Musculoskeletal -bony prominence on the lateral midportion of both feet RT>LT Extremities - no LE edmea  Results for orders placed or performed in visit on 11/09/17  POCT glucose (manual entry)  Result Value Ref Range   POC Glucose 401 (A) 70 - 99 mg/dl  POCT glucose (manual entry)  Result Value Ref Range   POC Glucose 411 (A) 70 - 99 mg/dl  POCT glucose (manual entry)  Result Value Ref Range   POC Glucose 375 (A) 70 - 99 mg/dl  POCT urinalysis dipstick  Result Value Ref Range   Color, UA yellow    Clarity, UA clear    Glucose, UA 500    Bilirubin, UA negative    Ketones, UA negative    Spec Grav, UA <=1.005 (A) 1.010 - 1.025   Blood, UA negative    pH, UA 5.5 5.0 - 8.0   Protein, UA negative    Urobilinogen, UA 0.2 0.2 or 1.0 E.U./dL   Nitrite, UA negative    Leukocytes, UA Negative Negative   Appearance     Odor     Results for orders placed or performed in visit on 11/09/17  POCT glucose (manual entry)  Result Value Ref Range   POC Glucose 401 (A) 70 - 99 mg/dl  POCT glucose (manual entry)  Result  Value Ref Range   POC Glucose 411 (A) 70 - 99 mg/dl  POCT glucose (manual entry)  Result Value Ref Range   POC Glucose 375 (A) 70 - 99 mg/dl  POCT urinalysis dipstick  Result Value Ref Range   Color, UA yellow    Clarity, UA clear    Glucose, UA 500    Bilirubin, UA negative    Ketones, UA negative    Spec Grav, UA <=1.005 (A) 1.010 - 1.025   Blood, UA negative    pH, UA 5.5 5.0 - 8.0   Protein, UA negative    Urobilinogen, UA 0.2 0.2 or 1.0 E.U./dL   Nitrite, UA negative    Leukocytes, UA Negative Negative   Appearance     Odor      ASSESSMENT AND PLAN: 1. Type 2 diabetes mellitus, uncontrolled, with neuropathy ([place]) Patient took 33 units of NovoLog of her own supply.  Additional 6 units given today in clinic with subsequent decrease in blood sugar to 375.  She was also given some water to drink. -I have asked her to check blood sugars 3 times a day before meals and bring in log on next visit. Increase Lantus to 76 units daily. Change NovoLog to 33 units in the mornings and 37 units with lunch and dinner.  Add Januvia. - POCT glucose (manual entry) - Hemoglobin A1c - Microalbumin / creatinine urine ratio - POCT glucose (manual entry) - Ambulatory referral to Endocrinology - Basic Metabolic Panel - CBC - insulin aspart (novoLOG) injection 6 Units - POCT glucose (manual entry) - POCT urinalysis dipstick - insulin aspart (NOVOLOG) 100 UNIT/ML FlexPen; 33 units with breakfast, 37 units with lunch  and dinner  Dispense: 30 mL; Refill: 6 - Insulin Glargine (LANTUS SOLOSTAR) 100 UNIT/ML Solostar Pen; Inject 76 Units into the skin daily at 10 pm.  Dispense: 5 pen; Refill: 2 - sitaGLIPtin (JANUVIA) 50 MG tablet; Take 1 tablet (50 mg total) by mouth daily.  Dispense: 30 tablet; Refill: 4  2. Urge incontinence Discussed starting her on Vesicare or Ditropan.  However patient declined after I went over side effects.  However she would like to get incontinence supplies.  Forms will be  completed for her. - POCT urinalysis dipstick  3. Chronic diastolic congestive heart failure ([place]) Followed by cardiology.  4. Essential hypertension Slightly elevated today but patient was tearful  5. Postoperative hypothyroidism Patient advised that switching from brand name Synthroid to levothyroxine can cause her thyroid levels to box around.  She wants to stay on the brand name.  Prescription printed and given to patient.  However I forgot to write on the prescription brand name only.  I called patient back this afternoon before leaving the office and a message on her voicemail that I need to write brand name only on the prescription or she can call and give me the name of the pharmacy and I can call it in for her - levothyroxine (SYNTHROID) 100 MCG tablet; Take 1 tablet (100 mcg total) by mouth daily before breakfast.  Dispense: 30 tablet; Refill: 6  6. Right foot pain - Ambulatory referral to Podiatry  Patient was given the opportunity to ask questions.  Patient verbalized understanding of the plan and was able to repeat key elements of the plan.   Orders Placed This Encounter  Procedures  . Hemoglobin A1c  . Microalbumin / creatinine urine ratio  . Basic Metabolic Panel  . CBC  . Ambulatory referral to Endocrinology  . Ambulatory referral to Podiatry  . POCT glucose (manual entry)  . POCT glucose (manual entry)  . POCT glucose (manual entry)  . POCT urinalysis dipstick     Requested Prescriptions   Signed Prescriptions Disp Refills  . levothyroxine (SYNTHROID) 100 MCG tablet 30 tablet 6    Sig: Take 1 tablet (100 mcg total) by mouth daily before breakfast.  . insulin aspart (NOVOLOG) 100 UNIT/ML FlexPen 30 mL 6    Sig: 33 units with breakfast, 37 units with lunch and dinner  . Insulin Glargine (LANTUS SOLOSTAR) 100 UNIT/ML Solostar Pen 5 pen 2    Sig: Inject 76 Units into the skin daily at 10 pm.  . sitaGLIPtin (JANUVIA) 50 MG tablet 30 tablet 4    Sig: Take 1  tablet (50 mg total) by mouth daily.    Return in about 1 month (around 12/07/2017) for blood sugar.Karle Plumber, MD, FACP

## 2017-11-10 ENCOUNTER — Telehealth: Payer: Self-pay | Admitting: Pharmacist

## 2017-11-10 ENCOUNTER — Telehealth: Payer: Self-pay | Admitting: Internal Medicine

## 2017-11-10 DIAGNOSIS — E875 Hyperkalemia: Secondary | ICD-10-CM

## 2017-11-10 LAB — BASIC METABOLIC PANEL
BUN / CREAT RATIO: 21 (ref 12–28)
BUN: 18 mg/dL (ref 8–27)
CHLORIDE: 101 mmol/L (ref 96–106)
CO2: 23 mmol/L (ref 20–29)
Calcium: 8.9 mg/dL (ref 8.7–10.3)
Creatinine, Ser: 0.84 mg/dL (ref 0.57–1.00)
GFR calc Af Amer: 85 mL/min/{1.73_m2} (ref >59)
GFR calc non Af Amer: 74 mL/min/{1.73_m2} (ref >59)
GLUCOSE: 413 mg/dL — AB (ref 65–99)
POTASSIUM: 5.5 mmol/L — AB (ref 3.5–5.2)
SODIUM: 136 mmol/L (ref 134–144)

## 2017-11-10 LAB — CBC
HEMATOCRIT: 37 % (ref 34.0–46.6)
HEMOGLOBIN: 12.5 g/dL (ref 11.1–15.9)
MCH: 30.4 pg (ref 26.6–33.0)
MCHC: 33.8 g/dL (ref 31.5–35.7)
MCV: 90 fL (ref 79–97)
Platelets: 205 10*3/uL (ref 150–379)
RBC: 4.11 x10E6/uL (ref 3.77–5.28)
RDW: 13.2 % (ref 12.3–15.4)
WBC: 4.6 10*3/uL (ref 3.4–10.8)

## 2017-11-10 LAB — MICROALBUMIN / CREATININE URINE RATIO
CREATININE, UR: 40.4 mg/dL
MICROALB/CREAT RATIO: 29 mg/g{creat} (ref 0.0–30.0)
MICROALBUM., U, RANDOM: 11.7 ug/mL

## 2017-11-10 LAB — HEMOGLOBIN A1C
ESTIMATED AVERAGE GLUCOSE: 212 mg/dL
Hgb A1c MFr Bld: 9 % — ABNORMAL HIGH (ref 4.8–5.6)

## 2017-11-10 MED FILL — NOVOLOG FLEXPEN SYRINGE: 100 | 28 days supply | Qty: 30 | Fill #0

## 2017-11-10 NOTE — Telephone Encounter (Signed)
PA received for Januvia. Patient has been prescribed Victoza previously. Will clarify with PCP which medication she is supposed to be on prior to proceeding with PA.

## 2017-11-10 NOTE — Telephone Encounter (Signed)
PC placed to pt today.   I left message on her VM informing her that potassium level is elevated.  Likely due to recent  increase dose of Spironolactone by Dr. Claiborne Billings.  Also can be due to hemolysis if blood sat for too long.   1:24 p.m:  I was able to speak with pt after calling again.  Pt informed of info above.  She took Spironolactone already this a.m.  Advised to hold on taking tonight and tomorrow morning.  Return to lab tomorrow to have K level recheck.  If level ok, will have her resume taking just 25 mg daily as before.  She did not listen to my message yesterday regarding the rxn for Synthroid.  She will drop off rxn for me to write "brand name" only on it. A1C was 9.  We have already adjusted insulin and added Januvia. Pt requested letter to get out of ticket for not wearing seat belt properly.  States she was coughing and had moved the seat beat from over shoulder but was fasten around her waist.  Told officer that she has heart issues and OSA and had a coughing spell so had removed the shoulder strap.  Told she would need doctors note. Pt advise that I can give her note stating her medical issues but none really excuses her from not wearing seat belt properly.

## 2017-11-11 ENCOUNTER — Telehealth: Payer: Self-pay | Admitting: Internal Medicine

## 2017-11-11 ENCOUNTER — Ambulatory Visit: Payer: Medicaid Other | Attending: Internal Medicine

## 2017-11-11 DIAGNOSIS — E875 Hyperkalemia: Secondary | ICD-10-CM | POA: Diagnosis present

## 2017-11-11 MED FILL — SYNTHROID 100 MCG TABLET: 100 | 30 days supply | Qty: 30 | Fill #0

## 2017-11-11 NOTE — Progress Notes (Signed)
Patient here for lab visit only 

## 2017-11-11 NOTE — Telephone Encounter (Signed)
Contacted pt to inform her that her letter is ready at the front desk lvm

## 2017-11-11 NOTE — Telephone Encounter (Signed)
[facility] called to check on paperwork for incontinent supplies. Please fu at your earliest convenience.

## 2017-11-11 NOTE — Telephone Encounter (Signed)
Just refaxed form with corrections on it

## 2017-11-12 ENCOUNTER — Telehealth: Payer: Self-pay

## 2017-11-12 ENCOUNTER — Encounter: Payer: Self-pay | Admitting: Pharmacist

## 2017-11-12 LAB — POTASSIUM: Potassium: 5 mmol/L (ref 3.5–5.2)

## 2017-11-12 MED FILL — DEXILANT DR 60 MG CAPSULE: 60 | 30 days supply | Qty: 30 | Fill #0

## 2017-11-12 MED FILL — JANUVIA 50 MG TABLET: 50 | 30 days supply | Qty: 30 | Fill #0

## 2017-11-12 NOTE — Telephone Encounter (Signed)
Contacted pt to go over lab results pt didn't answer left a detailed vm informing pt of results and if she has any questions or concerns to give me a call  If pt calls back please give results: potassium level has come down. She can restart Spironolactone but only take 25 mg once a day. Return to lab on Monday for recheck Potassium on this dose.

## 2017-11-12 NOTE — Progress Notes (Signed)
PA submitted for Januvia. Pending approval.   PA submitted for Dexilant. Approved. Approval #[id number]

## 2017-11-15 ENCOUNTER — Ambulatory Visit: Payer: Medicaid Other | Attending: Internal Medicine

## 2017-11-15 DIAGNOSIS — E875 Hyperkalemia: Secondary | ICD-10-CM

## 2017-11-15 NOTE — Addendum Note (Signed)
Addended by: Octaviano Glow on: 11/15/2017 05:03 PM   Modules accepted: Orders

## 2017-11-15 NOTE — Progress Notes (Signed)
Patient here for lab visit  

## 2017-11-16 ENCOUNTER — Telehealth: Payer: Self-pay

## 2017-11-16 LAB — POTASSIUM: POTASSIUM: 5.1 mmol/L (ref 3.5–5.2)

## 2017-11-16 NOTE — Telephone Encounter (Signed)
Contacted pt to go over lab results pt didn't answer lvm asking pt to give me a call at her earliest convenience  If pt calls back please give results: potassium level is in high normal range. Avoid potassium rich foods like bananas and orange juice. Just to be on the safe side, I would like to repeat the level one more time in 1 wk. Continue Spironolactone once a day.

## 2017-11-16 NOTE — Telephone Encounter (Signed)
Pt returned call and I spoke with pt regarding labs and to come back in for repeat k level in 1 week and to continue taking spironolactone once a day.  Dr. Wynetta Emery pt states she eats bananas everyday in the morning and she was told to eat bananas before she was discharged from the hospital. Pt wants to know what can she eat in place of the bananas

## 2017-11-16 NOTE — Telephone Encounter (Signed)
Pt. Came to facility to drop off Disability parking forms to be filled out by PCP. Pt. Would like to have her parking placard permanent. Forms will be put in PCP box and pt. Was told that the nurse would give her a call once forms have been completed. Please f/u

## 2017-11-19 ENCOUNTER — Other Ambulatory Visit: Payer: Self-pay | Admitting: Internal Medicine

## 2017-11-19 DIAGNOSIS — E1140 Type 2 diabetes mellitus with diabetic neuropathy, unspecified: Secondary | ICD-10-CM

## 2017-11-19 DIAGNOSIS — Z794 Long term (current) use of insulin: Secondary | ICD-10-CM

## 2017-11-19 DIAGNOSIS — E1165 Type 2 diabetes mellitus with hyperglycemia: Secondary | ICD-10-CM

## 2017-11-19 DIAGNOSIS — IMO0002 Reserved for concepts with insufficient information to code with codable children: Secondary | ICD-10-CM

## 2017-11-19 DIAGNOSIS — E118 Type 2 diabetes mellitus with unspecified complications: Secondary | ICD-10-CM

## 2017-11-19 DIAGNOSIS — I5030 Unspecified diastolic (congestive) heart failure: Secondary | ICD-10-CM

## 2017-11-19 MED ORDER — FUROSEMIDE 20 MG PO TABS: ORAL_TABLET | ORAL | 0 refills | Status: DC

## 2017-11-19 MED FILL — FUROSEMIDE 20 MG TABLET: 20 | 30 days supply | Qty: 30 | Fill #0

## 2017-11-19 MED FILL — GABAPENTIN 300 MG CAPSULE: 300 | 30 days supply | Qty: 120 | Fill #0

## 2017-11-19 MED FILL — ATORVASTATIN 40 MG TABLET: 40 | 30 days supply | Qty: 30 | Fill #7

## 2017-11-19 MED FILL — SPIRONOLACTONE 25 MG TABS: 25 | 30 days supply | Qty: 60 | Fill #5

## 2017-11-19 MED FILL — !LANTUS SOLOSTAR 100UNITS/M: 100 | 20 days supply | Qty: 15 | Fill #0

## 2017-11-19 MED FILL — CARVEDILOL 12.5 MG TABLET: 12.5 | 30 days supply | Qty: 60 | Fill #2

## 2017-11-19 NOTE — Telephone Encounter (Signed)
Contacted pt and informed her that handicap placard is ready for pick and apples are okay

## 2017-11-22 ENCOUNTER — Telehealth: Payer: Self-pay | Admitting: Cardiovascular Disease

## 2017-11-22 ENCOUNTER — Ambulatory Visit: Payer: Medicaid Other

## 2017-11-22 ENCOUNTER — Other Ambulatory Visit: Payer: Self-pay

## 2017-11-22 ENCOUNTER — Ambulatory Visit: Payer: Medicaid Other | Attending: Internal Medicine | Admitting: *Deleted

## 2017-11-22 VITALS — BP 147/77 | HR 88 | Resp 16

## 2017-11-22 DIAGNOSIS — R0789 Other chest pain: Secondary | ICD-10-CM

## 2017-11-22 DIAGNOSIS — R0602 Shortness of breath: Secondary | ICD-10-CM

## 2017-11-22 DIAGNOSIS — E875 Hyperkalemia: Secondary | ICD-10-CM

## 2017-11-22 MED FILL — LISINOPRIL 40 MG TAB: 40 | 30 days supply | Qty: 30 | Fill #8

## 2017-11-22 NOTE — Progress Notes (Signed)
Patient Triage Assessment FormTodays Date: 11/22/2017 Name: Anita James DOB: 08-06-1951 Reason for walkin: chest soreness and SOB. Weight gain, BLE edema. Episodes of "Sharp pain  across chest and smoothes out for a while." When did your symptoms start? Friday while in the rain Please list symptoms: No cough while patient in the office Are you having pain: yes   Assessment Feels like it goes across her chest Noticed in the upper in left arm.  BLE +2 Edema  Vital Signs:  T:  P: R: 16SpO2: 96 %RA BP: 147/ 79  Plan EKG reviewed by Dr. Wynetta Emery.  Per Dr. Wynetta Emery, no change to comparison EKG on  11/05/2017. Pt instructed to go to ED for cycling cardiac enzymes.

## 2017-11-22 NOTE — Progress Notes (Signed)
Patient here for lab visit only 

## 2017-11-22 NOTE — Telephone Encounter (Signed)
Patient calling,  States that she is waiting on letter from Dr. Claiborne Billings (in regards to most recent office visit 11/05/17) . Patient is waiting on update.

## 2017-11-22 NOTE — Telephone Encounter (Signed)
Returned call to patient, states nurse was suppose to inform Dr. Evette Georges nurse about a letter she is needing.  States she got a seatbelt violation on the way to her appt and only had her seatbelt around her waist, not on her chest due to her "cardiac issues" and her SOB.  States she was coughing and SOB that morning which is why she did not have it on her chest.   DA told her that if she gets a letter stating she has a medical reason to not wear her belt on her chest that they would get rid of the ticket.   Apologized that I was unaware of this and would have to have Dr. Claiborne Billings review prior to writing a letter as I am unable to state you have a medical reason to not wear a seatbelt on chest.   Patient advised her court date is 3/21  Routed to MD for review.

## 2017-11-23 ENCOUNTER — Telehealth: Payer: Self-pay

## 2017-11-23 DIAGNOSIS — I5030 Unspecified diastolic (congestive) heart failure: Secondary | ICD-10-CM

## 2017-11-23 LAB — POTASSIUM: POTASSIUM: 4.2 mmol/L (ref 3.5–5.2)

## 2017-11-23 MED ORDER — FUROSEMIDE 20 MG PO TABS: 20.0000 mg | ORAL_TABLET | Freq: Every day | ORAL | 3 refills | Status: DC

## 2017-11-23 NOTE — Telephone Encounter (Signed)
Will forward to pcp  *Please read my message*Forgot to forward it to you*

## 2017-11-23 NOTE — Telephone Encounter (Signed)
Contacted pt and informed her of Dr. Wynetta Emery response and pt states she is not going to take the lasix daily. Pt states she was told before not to take the lasix everyday. Pt states she picked up the lasix rx and saw it was 20mg  and that was the wrong dose. Pt states she is use to taking the 40mg  lasix. Pt states she had issues with her kidneys when she was taking it once a day

## 2017-11-23 NOTE — Addendum Note (Signed)
Addended by: Karle Plumber B on: 11/23/2017 12:41 PM   Modules accepted: Orders

## 2017-11-23 NOTE — Telephone Encounter (Signed)
Contacted pt to go over lab results pt is aware of results. Pt concerns are that she is having edema in both legs and ankles and there is redness also she wants to know does she continue to take the spironolactone once a day still. Please f/u

## 2017-11-24 NOTE — Telephone Encounter (Signed)
PC placed to pt this a.m.  I left message on VM. Informed pt that in regards to her concern of increase swelling in the legs, I recommend taking the Furosemide 20 mg daily.  Can return to the lab in one wk for recheck of kidney function. I also clarified that in looking over her chart, the dose prescribed in the past to use as needed was 20 mg not 40 mg. Advise to call if she had any further questions.

## 2017-11-29 NOTE — Telephone Encounter (Signed)
Refer to the primary care physician regarding this issue.

## 2017-12-01 NOTE — Telephone Encounter (Signed)
Follow Up:    Pt returning Hayley's call from today.

## 2017-12-01 NOTE — Telephone Encounter (Signed)
Left message to call back  

## 2017-12-02 ENCOUNTER — Ambulatory Visit: Payer: Medicaid Other

## 2017-12-02 NOTE — Telephone Encounter (Signed)
Spoke to patient, patient aware and is very upset.  States this "is when you lose confidence in your doctor" and hung up on nurse.

## 2017-12-08 ENCOUNTER — Telehealth: Payer: Self-pay | Admitting: Internal Medicine

## 2017-12-08 NOTE — Telephone Encounter (Signed)
Patient called and requested for the letter for court case regarding a set belt. Please fu at your earliest convenience. Patient stated she would like to pick it up tomorrow 12-09-17 if possible when she comes for lab work. The note has been wrote but her car is currently in shop, she is unable to get to her care.

## 2017-12-09 NOTE — Telephone Encounter (Signed)
Will forward to pcp   Dr. Wynetta Emery I looked in chart under letters and didn't see anything regarding a seat belt

## 2017-12-10 ENCOUNTER — Ambulatory Visit: Payer: Medicaid Other | Attending: Internal Medicine

## 2017-12-10 ENCOUNTER — Other Ambulatory Visit: Payer: Self-pay | Admitting: Internal Medicine

## 2017-12-10 DIAGNOSIS — E875 Hyperkalemia: Secondary | ICD-10-CM

## 2017-12-10 MED FILL — !DEXILANT DR 60 MG CAPSULE: 60 | 30 days supply | Qty: 30 | Fill #0

## 2017-12-10 MED FILL — JANUVIA 50 MG TABLET: 50 | 30 days supply | Qty: 30 | Fill #1

## 2017-12-10 NOTE — Progress Notes (Signed)
Patient here for lab visit only 

## 2017-12-10 NOTE — Addendum Note (Signed)
Addended by: Octaviano Glow on: 12/10/2017 10:21 AM   Modules accepted: Orders

## 2017-12-11 LAB — BASIC METABOLIC PANEL
BUN/Creatinine Ratio: 20 (ref 12–28)
BUN: 19 mg/dL (ref 8–27)
CHLORIDE: 103 mmol/L (ref 96–106)
CO2: 24 mmol/L (ref 20–29)
CREATININE: 0.95 mg/dL (ref 0.57–1.00)
Calcium: 9.1 mg/dL (ref 8.7–10.3)
GFR calc non Af Amer: 63 mL/min/{1.73_m2} (ref >59)
GFR, EST AFRICAN AMERICAN: 73 mL/min/{1.73_m2} (ref >59)
Glucose: 187 mg/dL — ABNORMAL HIGH (ref 65–99)
Potassium: 4.9 mmol/L (ref 3.5–5.2)
Sodium: 141 mmol/L (ref 134–144)

## 2017-12-13 ENCOUNTER — Telehealth: Payer: Self-pay

## 2017-12-13 NOTE — Telephone Encounter (Signed)
Contacted pt to go over lab results pt didn't answer left a detailed vm informing pt of results and if she has any questions or concerns to give me a call  

## 2017-12-15 ENCOUNTER — Telehealth: Payer: Self-pay | Admitting: Internal Medicine

## 2017-12-15 DIAGNOSIS — IMO0002 Reserved for concepts with insufficient information to code with codable children: Secondary | ICD-10-CM

## 2017-12-15 DIAGNOSIS — E1140 Type 2 diabetes mellitus with diabetic neuropathy, unspecified: Secondary | ICD-10-CM

## 2017-12-15 DIAGNOSIS — E1165 Type 2 diabetes mellitus with hyperglycemia: Principal | ICD-10-CM

## 2017-12-15 MED ORDER — LIRAGLUTIDE 18 MG/3ML ~~LOC~~ SOPN: PEN_INJECTOR | SUBCUTANEOUS | 0 refills | Status: DC

## 2017-12-15 MED FILL — **VICTOZA 18 MG/3 ML INJECT: 18 | 30 days supply | Qty: 9 | Fill #0

## 2017-12-15 MED FILL — $LANTUS SOLOSTAR 100 UNITS/: 100 | 20 days supply | Qty: 15 | Fill #1

## 2017-12-15 MED FILL — NOVOLOG FLEXPEN SYRINGE: 100 | 23 days supply | Qty: 21 | Fill #0

## 2017-12-15 NOTE — Telephone Encounter (Signed)
I sent Anita James to [facility] but everything else appears to be there when I look at the pharmacy records and many of her medications they are currently working on filling so she should be good to go.

## 2017-12-15 NOTE — Telephone Encounter (Signed)
Anita James would you be able to assist this with request

## 2017-12-15 NOTE — Telephone Encounter (Signed)
Please send over all new prescriptions with the right dose to [facility]. Please call patient back

## 2017-12-17 MED FILL — LISINOPRIL 40 MG TAB: 40 | 30 days supply | Qty: 30 | Fill #9

## 2018-01-04 ENCOUNTER — Ambulatory Visit: Payer: Medicaid Other | Admitting: Internal Medicine

## 2018-01-10 ENCOUNTER — Telehealth: Payer: Self-pay

## 2018-01-10 MED FILL — GABAPENTIN 300 MG CAPSULE: 300 | 30 days supply | Qty: 120 | Fill #1

## 2018-01-10 MED FILL — LISINOPRIL 40 MG TAB: 40 | 30 days supply | Qty: 30 | Fill #10

## 2018-01-10 MED FILL — CARVEDILOL 12.5 MG TABLET: 12.5 | 30 days supply | Qty: 75 | Fill #1

## 2018-01-10 MED FILL — !DEXILANT DR 60 MG CAPSULE: 60 | 30 days supply | Qty: 30 | Fill #1

## 2018-01-10 MED FILL — !JANUVIA 50 MG TABLET: 50 | 30 days supply | Qty: 30 | Fill #2

## 2018-01-10 NOTE — Telephone Encounter (Signed)
Patient called wanting for [place] to fax her PPD results to her new job she does not have a medical release from sign so I told her we couldn't do that she ask if I could fax the form and I did it so she would fill out and than I could fax the results to her new job    CMA obtain the form and once she had the form fill out and sign by her CMA release the results to the new job

## 2018-01-12 MED FILL — SYNTHROID 100 MCG TABLET: 100 | 30 days supply | Qty: 30 | Fill #1

## 2018-01-14 ENCOUNTER — Other Ambulatory Visit: Payer: Self-pay | Admitting: Internal Medicine

## 2018-01-14 DIAGNOSIS — E1165 Type 2 diabetes mellitus with hyperglycemia: Principal | ICD-10-CM

## 2018-01-14 DIAGNOSIS — IMO0002 Reserved for concepts with insufficient information to code with codable children: Secondary | ICD-10-CM

## 2018-01-14 DIAGNOSIS — E1140 Type 2 diabetes mellitus with diabetic neuropathy, unspecified: Secondary | ICD-10-CM

## 2018-02-02 MED FILL — $novoLOG FLEXPEN SYRINGE: 100 | 23 days supply | Qty: 21 | Fill #0

## 2018-02-02 MED FILL — $VICTOZA 2-PAK 18MG/3ML PEN: 18 | 30 days supply | Qty: 9 | Fill #1

## 2018-02-02 MED FILL — $LANTUS SOLOSTAR 100 UNITS/: 100 | 20 days supply | Qty: 15 | Fill #2

## 2018-02-07 ENCOUNTER — Encounter

## 2018-02-15 MED FILL — LISINOPRIL 40 MG TABLET: 40 | 30 days supply | Qty: 30 | Fill #11

## 2018-02-15 MED FILL — GABAPENTIN 300 MG CAPSULE: 300 | 30 days supply | Qty: 120 | Fill #2

## 2018-02-15 MED FILL — ?FUROSEMIDE 20 MG TABLET: 20 | 30 days supply | Qty: 30 | Fill #0

## 2018-02-15 MED FILL — ?CARVEDILOL 12.5 MG TABLET: 12.5 | 30 days supply | Qty: 75 | Fill #2

## 2018-02-15 MED FILL — $DEXILANT DR 60 MG CAPSULE: 60 | 30 days supply | Qty: 30 | Fill #2

## 2018-02-15 MED FILL — ?FUROSEMIDE 40 MG TABLET: 40 | 30 days supply | Qty: 15 | Fill #6

## 2018-02-15 MED FILL — $JANUVIA 50 MG TABLET: 50 | 30 days supply | Qty: 30 | Fill #3

## 2018-02-17 ENCOUNTER — Other Ambulatory Visit: Payer: Self-pay

## 2018-02-17 DIAGNOSIS — E1140 Type 2 diabetes mellitus with diabetic neuropathy, unspecified: Secondary | ICD-10-CM

## 2018-02-17 DIAGNOSIS — IMO0002 Reserved for concepts with insufficient information to code with codable children: Secondary | ICD-10-CM

## 2018-02-17 DIAGNOSIS — E1165 Type 2 diabetes mellitus with hyperglycemia: Principal | ICD-10-CM

## 2018-02-17 MED ORDER — INSULIN ASPART 100 UNIT/ML FLEXPEN: PEN_INJECTOR | SUBCUTANEOUS | 3 refills | Status: DC

## 2018-02-17 MED ORDER — INSULIN GLARGINE 100 UNIT/ML SOLOSTAR PEN: PEN_INJECTOR | SUBCUTANEOUS | 3 refills | Status: DC

## 2018-02-17 MED ORDER — LIRAGLUTIDE 18 MG/3ML ~~LOC~~ SOPN: PEN_INJECTOR | SUBCUTANEOUS | 3 refills | Status: DC

## 2018-02-17 MED FILL — $VICTOZA 2-PAK 18MG/3ML PEN: 18 | 28 days supply | Qty: 9 | Fill #0

## 2018-02-17 MED FILL — $novoLOG FLEXPEN SYRINGE: 100 | 30 days supply | Qty: 27 | Fill #0

## 2018-02-17 MED FILL — $LANTUS SOLOSTAR 100 UNITS/: 100 | 32 days supply | Qty: 24 | Fill #0

## 2018-02-21 MED FILL — SYNTHROID 100 MCG TABLET: 100 | 30 days supply | Qty: 30 | Fill #2

## 2018-03-14 ENCOUNTER — Ambulatory Visit: Payer: Medicaid Other | Admitting: Endocrinology

## 2018-03-14 ENCOUNTER — Other Ambulatory Visit: Payer: Self-pay | Admitting: Internal Medicine

## 2018-03-14 DIAGNOSIS — I10 Essential (primary) hypertension: Secondary | ICD-10-CM

## 2018-03-14 DIAGNOSIS — E118 Type 2 diabetes mellitus with unspecified complications: Secondary | ICD-10-CM

## 2018-03-14 DIAGNOSIS — Z794 Long term (current) use of insulin: Secondary | ICD-10-CM

## 2018-03-14 DIAGNOSIS — E1140 Type 2 diabetes mellitus with diabetic neuropathy, unspecified: Secondary | ICD-10-CM

## 2018-03-14 MED FILL — $LANTUS SOLOSTAR 100 UNITS/: 100 | 32 days supply | Qty: 24 | Fill #1

## 2018-03-14 MED FILL — SYNTHROID 100 MCG TABLET: 100 | 30 days supply | Qty: 30 | Fill #3

## 2018-03-14 MED FILL — $JANUVIA 50 MG TABLET: 50 | 30 days supply | Qty: 30 | Fill #4

## 2018-03-14 MED FILL — ?CARVEDILOL 12.5 MG TABLET: 12.5 | 30 days supply | Qty: 75 | Fill #3

## 2018-03-16 MED FILL — GABAPENTIN 300 MG CAPSULE: 300 | 30 days supply | Qty: 120 | Fill #0

## 2018-03-16 MED FILL — $DEXILANT DR 60 MG CAPSULE: 60 | 30 days supply | Qty: 30 | Fill #0

## 2018-03-16 MED FILL — LISINOPRIL 40 MG TABLET: 40 | 30 days supply | Qty: 30 | Fill #0

## 2018-04-01 ENCOUNTER — Telehealth: Payer: Self-pay | Admitting: Internal Medicine

## 2018-04-01 DIAGNOSIS — Z9989 Dependence on other enabling machines and devices: Principal | ICD-10-CM

## 2018-04-01 DIAGNOSIS — G4733 Obstructive sleep apnea (adult) (pediatric): Secondary | ICD-10-CM

## 2018-04-01 LAB — GLUCOSE, POCT (MANUAL RESULT ENTRY): POC Glucose: 436 mg/dl — AB (ref 70–99)

## 2018-04-01 MED FILL — $novoLOG FLEXPEN SYRINGE: 100 | 30 days supply | Qty: 27 | Fill #1

## 2018-04-01 MED FILL — $VICTOZA 2-PAK 18MG/3ML PEN: 18 | 28 days supply | Qty: 9 | Fill #1

## 2018-04-01 MED FILL — ?FUROSEMIDE 20 MG TABLET: 20 | 30 days supply | Qty: 30 | Fill #1

## 2018-04-01 NOTE — Telephone Encounter (Signed)
Anita James could you clarify

## 2018-04-01 NOTE — Telephone Encounter (Signed)
Will forward to pcp

## 2018-04-01 NOTE — Telephone Encounter (Signed)
Patient came in requesting breathing tube CPAP but not through [facility].

## 2018-04-08 NOTE — Telephone Encounter (Signed)
Contacted pt and left a detailed vm making pt aware that rx is ready for pickup at the front desk

## 2018-04-18 ENCOUNTER — Other Ambulatory Visit: Payer: Self-pay | Admitting: Internal Medicine

## 2018-04-18 DIAGNOSIS — IMO0002 Reserved for concepts with insufficient information to code with codable children: Secondary | ICD-10-CM

## 2018-04-18 DIAGNOSIS — E1165 Type 2 diabetes mellitus with hyperglycemia: Principal | ICD-10-CM

## 2018-04-18 DIAGNOSIS — E785 Hyperlipidemia, unspecified: Secondary | ICD-10-CM

## 2018-04-18 DIAGNOSIS — E1140 Type 2 diabetes mellitus with diabetic neuropathy, unspecified: Secondary | ICD-10-CM

## 2018-04-18 MED FILL — $JANUVIA 50 MG TABLET: 50 | 30 days supply | Qty: 30 | Fill #0

## 2018-04-18 MED FILL — $DEXILANT DR 60 MG CAPSULE: 60 | 30 days supply | Qty: 30 | Fill #1

## 2018-04-18 MED FILL — GABAPENTIN 300 MG CAPSULE: 300 | 30 days supply | Qty: 120 | Fill #1

## 2018-04-18 MED FILL — SPIRONOLACTONE 25 MG TABLET: 25 | 30 days supply | Qty: 60 | Fill #6

## 2018-04-18 MED FILL — $LANTUS SOLOSTAR 100 UNITS/: 100 | 32 days supply | Qty: 24 | Fill #2

## 2018-04-18 MED FILL — LISINOPRIL 40 MG TABLET: 40 | 30 days supply | Qty: 30 | Fill #1

## 2018-04-18 MED FILL — ?ATORVASTATIN 40MG TABLET: 40 | 30 days supply | Qty: 30 | Fill #0

## 2018-04-18 MED FILL — ?CARVEDILOL 12.5 MG TABLET: 12.5 | 30 days supply | Qty: 75 | Fill #4

## 2018-04-18 MED FILL — SYNTHROID 100 MCG TABLET: 100 | 30 days supply | Qty: 30 | Fill #4

## 2018-04-19 ENCOUNTER — Ambulatory Visit: Payer: Self-pay

## 2018-04-19 ENCOUNTER — Encounter: Payer: Self-pay | Admitting: *Deleted

## 2018-04-27 ENCOUNTER — Other Ambulatory Visit: Payer: Self-pay

## 2018-04-27 MED ORDER — ACCU-CHEK SOFTCLIX LANCETS MISC: 12 refills | Status: DC

## 2018-04-27 MED ORDER — GLUCOSE BLOOD VI STRP: ORAL_STRIP | 12 refills | Status: DC

## 2018-04-27 MED ORDER — ACCU-CHEK AVIVA PLUS W/DEVICE KIT: PACK | 0 refills | Status: AC

## 2018-04-27 MED FILL — ACCU-CHEK FASTCLIX LANCETS: 51 days supply | Qty: 102 | Fill #0

## 2018-04-27 MED FILL — ACCU-CHEK AVIVA PLUS METER: W/DEVICE | 30 days supply | Qty: 1 | Fill #0

## 2018-04-27 MED FILL — ACCU-CHEK AVIVA PLUS TEST S: 50 days supply | Qty: 100 | Fill #0

## 2018-04-28 ENCOUNTER — Inpatient Hospital Stay: Payer: Self-pay

## 2018-04-29 ENCOUNTER — Ambulatory Visit: Payer: Medicaid Other | Attending: Internal Medicine | Admitting: Family Medicine

## 2018-04-29 VITALS — BP 134/71 | HR 96 | Temp 98.1°F | Ht 66.0 in | Wt 308.2 lb

## 2018-04-29 DIAGNOSIS — E669 Obesity, unspecified: Secondary | ICD-10-CM | POA: Diagnosis not present

## 2018-04-29 DIAGNOSIS — Z794 Long term (current) use of insulin: Secondary | ICD-10-CM | POA: Insufficient documentation

## 2018-04-29 DIAGNOSIS — I130 Hypertensive heart and chronic kidney disease with heart failure and stage 1 through stage 4 chronic kidney disease, or unspecified chronic kidney disease: Secondary | ICD-10-CM | POA: Insufficient documentation

## 2018-04-29 DIAGNOSIS — Z882 Allergy status to sulfonamides status: Secondary | ICD-10-CM | POA: Insufficient documentation

## 2018-04-29 DIAGNOSIS — G8929 Other chronic pain: Secondary | ICD-10-CM | POA: Diagnosis not present

## 2018-04-29 DIAGNOSIS — Z880 Allergy status to penicillin: Secondary | ICD-10-CM | POA: Insufficient documentation

## 2018-04-29 DIAGNOSIS — Z885 Allergy status to narcotic agent status: Secondary | ICD-10-CM | POA: Diagnosis not present

## 2018-04-29 DIAGNOSIS — E1122 Type 2 diabetes mellitus with diabetic chronic kidney disease: Secondary | ICD-10-CM | POA: Insufficient documentation

## 2018-04-29 DIAGNOSIS — E039 Hypothyroidism, unspecified: Secondary | ICD-10-CM

## 2018-04-29 DIAGNOSIS — D649 Anemia, unspecified: Secondary | ICD-10-CM | POA: Insufficient documentation

## 2018-04-29 DIAGNOSIS — E1165 Type 2 diabetes mellitus with hyperglycemia: Secondary | ICD-10-CM | POA: Diagnosis not present

## 2018-04-29 DIAGNOSIS — IMO0002 Reserved for concepts with insufficient information to code with codable children: Secondary | ICD-10-CM

## 2018-04-29 DIAGNOSIS — E785 Hyperlipidemia, unspecified: Secondary | ICD-10-CM | POA: Diagnosis not present

## 2018-04-29 DIAGNOSIS — F419 Anxiety disorder, unspecified: Secondary | ICD-10-CM | POA: Diagnosis not present

## 2018-04-29 DIAGNOSIS — E559 Vitamin D deficiency, unspecified: Secondary | ICD-10-CM | POA: Diagnosis not present

## 2018-04-29 DIAGNOSIS — E875 Hyperkalemia: Secondary | ICD-10-CM | POA: Diagnosis not present

## 2018-04-29 DIAGNOSIS — Z888 Allergy status to other drugs, medicaments and biological substances status: Secondary | ICD-10-CM | POA: Diagnosis not present

## 2018-04-29 DIAGNOSIS — Z881 Allergy status to other antibiotic agents status: Secondary | ICD-10-CM | POA: Insufficient documentation

## 2018-04-29 DIAGNOSIS — E782 Mixed hyperlipidemia: Secondary | ICD-10-CM

## 2018-04-29 DIAGNOSIS — I5032 Chronic diastolic (congestive) heart failure: Secondary | ICD-10-CM

## 2018-04-29 DIAGNOSIS — E1140 Type 2 diabetes mellitus with diabetic neuropathy, unspecified: Secondary | ICD-10-CM | POA: Diagnosis not present

## 2018-04-29 DIAGNOSIS — K625 Hemorrhage of anus and rectum: Secondary | ICD-10-CM

## 2018-04-29 LAB — POCT GLYCOSYLATED HEMOGLOBIN (HGB A1C): HbA1c, POC (controlled diabetic range): 8.7 % — AB (ref 0.0–7.0)

## 2018-04-29 LAB — GLUCOSE, POCT (MANUAL RESULT ENTRY): POC Glucose: 231 mg/dL — AB (ref 70–99)

## 2018-04-29 MED FILL — NOVOLOG FLEXPEN SYRINGE: 100 | 33 days supply | Qty: 30 | Fill #2

## 2018-04-29 NOTE — Patient Instructions (Signed)
Hyperkalemia °Hyperkalemia is when you have too much potassium in your blood. Potassium is normally removed (excreted) from your body by your kidneys. If there is too much potassium in your blood, it can affect how your heart works. °Follow these instructions at home: °· Take medicines only as told by your doctor. °· Do not take any supplements, natural products, herbs, or vitamins unless your doctor says it is okay. °· Limit your alcohol intake as told by your doctor. °· Stop illegal drug use. If you need help quitting, ask your doctor. °· Keep all follow-up visits as told by your doctor. This is important. °· If you have kidney disease, you may need to follow a low potassium diet. A food specialist (dietitian) can help you. °Contact a doctor if: °· Your heartbeat is not regular or very slow. °· You feel dizzy (light-headed). °· You feel weak. °· You feel sick to your stomach (nauseous). °· You have tingling in your hands or feet. °· You cannot feel your hands or feet. °Get help right away if: °· You are short of breath. °· You have chest pain. °· You pass out (faint). °· You cannot move your muscles. °This information is not intended to replace advice given to you by your health care provider. Make sure you discuss any questions you have with your health care provider. °Document Released: 09/07/2005 Document Revised: 02/13/2016 Document Reviewed: 12/13/2013 °Elsevier Interactive Patient Education © 2018 [employer] ° °

## 2018-04-29 NOTE — Progress Notes (Addendum)
Subjective:    [name], female    DOB: Aug 28, 1951, 67 y.o.   MRN: [id number]  HPI 67 yo female seen in follow-up of chronic medical issues. Patient states that she was seen about 2 weeks ago at an ED in [place] after onset of rectal bleeding. Patient reports bright red blood per rectum a few times prior to her ED visit. Patient was told that she needs to follow-up with GI for a colonoscopy. Patient reports that she received paperwork from the ED that her potassium was high and her kidney's were not working properly and she had her daughter show her lab work to the kidney doctor's that the daughter works for and patient says that she was told that her kidneys are only working at 50% and that this is being caused by some of her fluid medications including her furosemide and spironlactone and now she has had increased swelling in her legs and she continues to gain weight and the weight is affecting how she walks. She feels very uncomfortable due to her weight gain. Patient was also told to stop eating bananas due to her high potassium and she was previously eating them daily.       Patient reports adherence to her use of insulin and she tries to follow a diabetic diet but not always successfully.  Patient is unable to exercise secondary to joint pain, fatigue and weight gain.  Patient is taking her blood pressure medication but wonders if she should continue the lisinopril as she is afraid that this medication is affecting her kidneys.  Patient denies any chest pain or palpitations.  Patient does have some shortness of breath with exertion.  Patient reports that she is taking her thyroid medication.  Past Medical History:  Diagnosis Date  . Anxiety   . Carpal tunnel syndrome 06/14/2014  . Chest pain   . CHF (congestive heart failure) ([place])   . Chronic back pain   . Chronic kidney disease 03/2013   failure due to meds  . Diabetes mellitus type II, uncontrolled ([place])   .  Diverticulitis   . Frequent headaches   . Hiatal hernia   . Hyperlipemia   . Hypertension   . Hypothyroidism   . 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 Neuropathy   . Obesity   . Pneumonia   . Vitamin D deficiency 06/05/2014   Past Surgical History:  Procedure Laterality Date  . CESAREAN SECTION    . KNEE SURGERY Left   . THYROID SURGERY     goiter   Social History   Tobacco Use  . Smoking status: Never Smoker  . Smokeless tobacco: Never Used  Substance Use Topics  . Alcohol use: No  . Drug use: No   Allergies  Allergen Reactions  . Amoxicillin-Pot Clavulanate Nausea And Vomiting and Other (See Comments)    Combination of medications tear lining of stomach  . Hydrocodone-Acetaminophen Shortness Of Breath  . Oxycodone Shortness Of Breath    oxycontin too  . Sulfa Antibiotics Shortness Of Breath  . Azithromycin   . Norvasc [Amlodipine Besylate] Swelling    feet  . Sulfamethoxazole Nausea Only  . Tramadol Other (See Comments)    Unknown, toxicity. Patient advised she had to be given Narcan with this     BMP Latest Ref Rng & Units 12/10/2017 11/22/2017 11/15/2017  Glucose 65 - 99 mg/dL 187(H) - -  BUN 8 - 27 mg/dL 19 - -  Creatinine 0.57 - 1.00  mg/dL 0.95 - -  BUN/Creat Ratio 12 - 28 20 - -  Sodium 134 - 144 mmol/L 141 - -  Potassium 3.5 - 5.2 mmol/L 4.9 4.2 5.1  Chloride 96 - 106 mmol/L 103 - -  CO2 20 - 29 mmol/L 24 - -  Calcium 8.7 - 10.3 mg/dL 9.1 - -    Review of Systems  Constitutional: Positive for unexpected weight change. Negative for fatigue.  Respiratory: Positive for cough and shortness of breath (some SOB with activity).   Cardiovascular: Positive for leg swelling. Negative for chest pain and palpitations.  Gastrointestinal: Positive for anal bleeding and rectal pain (recurrent burning sensation in her rectum). Negative for constipation, diarrhea and nausea.  Endocrine: Positive for polyuria (patient thinks that this is becuase she drinks a lot of water).    Genitourinary: Positive for frequency. Negative for dysuria.  Musculoskeletal: Positive for back pain and myalgias.  Neurological: Positive for numbness (sometimes in hands and feet). Negative for dizziness and headaches.       Objective:   Physical Exam  Constitutional: She is oriented to person, place, and time. She appears well-developed. No distress.  Obese older female in no acute distress  Neck: Normal range of motion. Neck supple. No thyromegaly present.  Cardiovascular: Normal rate, regular rhythm and intact distal pulses.  Pulmonary/Chest: Breath sounds normal.  Abdominal: Soft. She exhibits distension (Possible abdominal distention but patient also with truncal obesity). There is no tenderness. There is no rebound and no guarding.  Musculoskeletal: She exhibits edema (bilateral LE edema with pretibial 1 plus pittin to just above mid-shin).  Neurological: She is alert and oriented to person, place, and time. No cranial nerve deficit.  Skin:  No active skin breakdown on the feet  Psychiatric: She has a normal mood and affect.  Patient tends to talk quickly and loudly and as it is the first time I have seen her as a patient, I am not sure if this is her normal baseline  Nursing note and vitals reviewed.        Assessment & Plan:  1. Type 2 diabetes, uncontrolled, with neuropathy ([place]) Patient's blood sugar continues to be poorly controlled but A1c is slightly better at today's visit at 8.7 as compared to prior 9.0 in February.  Patient states that she is compliant with her use of insulin and would like to have tighter control of her glucose as she is worried about her renal function.  Patient does have history of some neuropathy and does take gabapentin but this also helps with her pain secondary to cervical radiculopathy.  Patient will have CMP at today's visit in addition to the A1c and random glucose which were performed.  Patient is encouraged to follow-up after her blood  work and I will also likely make referral to clinical pharmacist regarding management and adjustment of her insulin after her lab work is available. - POCT glucose (manual entry) - POCT glycosylated hemoglobin (Hb A1C) - Comprehensive metabolic panel - Ambulatory referral to Nephrology  2. Hypothyroidism, unspecified type Patient with hypothyroidism and states that she has been compliant with taking her medication.  Patient will have TSH at today's visit to see if hypothyroidism may be contributing to her peripheral edema weight gain.  Patient will be notified if any change is needed in her dose of thyroid medication based on her lab results. - TSH  3. Mixed hyperlipidemia Patient with mixed hyperlipidemia for which she is on atorvastatin and patient is encouraged to remain  compliant with the use of this medication in addition to a low-fat diet  4. Chronic diastolic congestive heart failure ([place]) Patient with chronic diastolic heart failure but recently stopped the use of her Lasix and spironolactone due to concerns over her renal function.  Patient's creatinine and EGFR were normal on lab work done earlier this year but patient did have mild increase in her creatinine at 1.09 during her emergency room visit which the ED doctor felt was insignificant, estimated GFR was 54 with BUN of 21.  Patient now with increased peripheral edema and was advised to restart the use of her spironolactone and Lasix as well as to elevate her legs over the weekend.  Patient is under the impression that she has severe renal disease and repeatedly stated that she does not wish to be on dialysis and this is why she stopped her fluid medication.  Patient will have CMP. - Comprehensive metabolic panel  5. Rectal bleeding Patient with rectal bleeding for which she was seen on 04/15/2018 at [facility] and had Hemoccult positive stool test.  CBC showed only mild anemia with hemoglobin of 11.8.  Patient will be  referred to gastroenterology for further evaluation and will repeat CBC at today's visit to see if there has been any further decrease in hemoglobin.  Patient requests referral to gastroenterologist in [place].  Patient reports no further bleeding since she was seen in the ED but does have some rectal burning and patient may continue the use of over-the-counter hemorrhoidal medication. - CBC with Differential  - Ambulatory referral to Gastroenterology  6. Hyperkalemia Patient with potassium of 5.6 during her emergency department visit on 04/15/2018 for rectal bleeding.  ED notes suggest that this may have been due to sample being hemolyzed.  Patient states that she was advised to stop eating bananas which she has done.  During the visit, patient contacted her daughter who was supposed to have come with the patient and brought the patient's paperwork from the emergency department visit.  I did speak with the daughter via speaker phone during patient's appointment time-patient and daughter were both able to hear the conversation and respond.  Patient reports that her daughter works for kidney doctors and 1 of these doctors reviewed patient's blood work/records from the emergency department visit and patient and daughter think that patient's Lasix and spironolactone among other medications including lisinopril have caused patient to have decreased renal function.  Daughter would like to have renal ultrasound ordered.  I discussed with patient and daughter that the CMP will be repeated at today's visit to assess current renal function.  Patient also may have had some dehydration at her ED visit as her blood sugar was 325 at the time which wIll cause increased urination and dehydration and patient overall has had poor control of her blood sugars.  Also discussed that lab work done earlier in the year showed normal Cr and GFR.  I agreed to refer patient to the nephrology office where her daughter works as I would  like patient to better understand her renal function and also to hopefully avoid patient self discontinuing her medications.  Patient reports that she has not taken her fluid pills in about 2 weeks and I discussed that use of her fluid pills helps to balance out potential increase in potassium associated with ACE inhibitor use and that ACE inhibitors can help protect the kidneys from the effects of diabetes as patient's daughter also wanted patient ACE inhibitor discontinued.  An  After Visit Summary was printed and given to the patient.  Return in about 4 weeks (around 05/27/2018) for f/u DM and potassium.

## 2018-04-30 LAB — CBC WITH DIFFERENTIAL/PLATELET
Basophils Absolute: 0 x10E3/uL (ref 0.0–0.2)
Basos: 0 %
EOS (ABSOLUTE): 0.1 x10E3/uL (ref 0.0–0.4)
Eos: 1 %
Hematocrit: 36 % (ref 34.0–46.6)
Hemoglobin: 11.4 g/dL (ref 11.1–15.9)
Immature Grans (Abs): 0 x10E3/uL (ref 0.0–0.1)
Immature Granulocytes: 0 %
Lymphocytes Absolute: 1.4 x10E3/uL (ref 0.7–3.1)
Lymphs: 23 %
MCH: 29.2 pg (ref 26.6–33.0)
MCHC: 31.7 g/dL (ref 31.5–35.7)
MCV: 92 fL (ref 79–97)
Monocytes Absolute: 0.7 x10E3/uL (ref 0.1–0.9)
Monocytes: 12 %
Neutrophils Absolute: 3.7 x10E3/uL (ref 1.4–7.0)
Neutrophils: 64 %
Platelets: 215 x10E3/uL (ref 150–450)
RBC: 3.9 x10E6/uL (ref 3.77–5.28)
RDW: 13.4 % (ref 12.3–15.4)
WBC: 5.9 x10E3/uL (ref 3.4–10.8)

## 2018-04-30 LAB — COMPREHENSIVE METABOLIC PANEL WITH GFR
ALT: 21 IU/L (ref 0–32)
AST: 16 IU/L (ref 0–40)
Albumin/Globulin Ratio: 1.6 (ref 1.2–2.2)
Albumin: 4.1 g/dL (ref 3.6–4.8)
Alkaline Phosphatase: 60 IU/L (ref 39–117)
BUN/Creatinine Ratio: 21 (ref 12–28)
BUN: 19 mg/dL (ref 8–27)
Bilirubin Total: 0.3 mg/dL (ref 0.0–1.2)
CO2: 21 mmol/L (ref 20–29)
Calcium: 9.4 mg/dL (ref 8.7–10.3)
Chloride: 107 mmol/L — ABNORMAL HIGH (ref 96–106)
Creatinine, Ser: 0.92 mg/dL (ref 0.57–1.00)
GFR calc Af Amer: 76 mL/min/1.73
GFR calc non Af Amer: 66 mL/min/1.73
Globulin, Total: 2.6 g/dL (ref 1.5–4.5)
Glucose: 204 mg/dL — ABNORMAL HIGH (ref 65–99)
Potassium: 4.8 mmol/L (ref 3.5–5.2)
Sodium: 142 mmol/L (ref 134–144)
Total Protein: 6.7 g/dL (ref 6.0–8.5)

## 2018-04-30 LAB — TSH: TSH: 0.515 u[IU]/mL (ref 0.450–4.500)

## 2018-05-02 ENCOUNTER — Telehealth: Payer: Self-pay | Admitting: Family Medicine

## 2018-05-02 NOTE — Telephone Encounter (Signed)
Patient has received lab results. 

## 2018-05-02 NOTE — Telephone Encounter (Signed)
Patient called requesting lab results, please follow up with patient.  °

## 2018-05-03 ENCOUNTER — Encounter: Payer: Self-pay | Admitting: Gastroenterology

## 2018-05-09 ENCOUNTER — Telehealth: Payer: Self-pay | Admitting: Family Medicine

## 2018-05-09 MED FILL — TORSEMIDE 20 MG TABLET: 20 | 30 days supply | Qty: 30 | Fill #0

## 2018-05-09 NOTE — Telephone Encounter (Signed)
Left message on voicemail to return call. Unable to reach.

## 2018-05-09 NOTE — Telephone Encounter (Signed)
Pt called to request a nurse call back in regards to a program she is trying to get into, please follow up with patient because she did not want to disclose the exact program

## 2018-05-09 NOTE — Telephone Encounter (Signed)
2nd attempt to reach patient. Left message on voicemail to return call.

## 2018-05-12 NOTE — Telephone Encounter (Signed)
3rd attempt to contact patient to respond to question regarding a program. Left message on voicemail to return call.

## 2018-05-16 ENCOUNTER — Telehealth: Payer: Self-pay | Admitting: Family Medicine

## 2018-05-16 NOTE — Telephone Encounter (Signed)
Patient called requesting letter for CPAP machine letter, patient states the letter was lost, please follow up with the patient.

## 2018-05-17 MED FILL — LISINOPRIL 40 MG TABLET: 40 | 30 days supply | Qty: 30 | Fill #2

## 2018-05-17 MED FILL — CARVEDILOL 12.5 MG TABLET: 12.5 | 30 days supply | Qty: 75 | Fill #5

## 2018-05-17 MED FILL — SYNTHROID 100 MCG TABLET: 100 | 30 days supply | Qty: 30 | Fill #5

## 2018-05-17 MED FILL — LANTUS SOLOSTAR 100 UNITS/M: 100 | 32 days supply | Qty: 24 | Fill #3

## 2018-05-17 MED FILL — DEXILANT DR 60 MG CAPSULE: 60 | 30 days supply | Qty: 30 | Fill #2

## 2018-05-18 ENCOUNTER — Ambulatory Visit: Payer: Self-pay | Admitting: Endocrinology

## 2018-05-19 ENCOUNTER — Other Ambulatory Visit: Payer: Self-pay

## 2018-05-19 DIAGNOSIS — IMO0002 Reserved for concepts with insufficient information to code with codable children: Secondary | ICD-10-CM

## 2018-05-19 DIAGNOSIS — E785 Hyperlipidemia, unspecified: Secondary | ICD-10-CM

## 2018-05-19 DIAGNOSIS — E1165 Type 2 diabetes mellitus with hyperglycemia: Secondary | ICD-10-CM

## 2018-05-19 DIAGNOSIS — E1140 Type 2 diabetes mellitus with diabetic neuropathy, unspecified: Secondary | ICD-10-CM

## 2018-05-19 MED ORDER — ATORVASTATIN CALCIUM 40 MG PO TABS: ORAL_TABLET | ORAL | 2 refills | Status: DC

## 2018-05-19 MED ORDER — SPIRONOLACTONE 25 MG PO TABS: ORAL_TABLET | ORAL | 3 refills | Status: DC

## 2018-05-19 MED ORDER — SITAGLIPTIN PHOSPHATE 50 MG PO TABS: 50.0000 mg | ORAL_TABLET | Freq: Every day | ORAL | 2 refills | Status: DC

## 2018-05-19 MED FILL — ATORVASTATIN 40 MG TABLET: 40 | 30 days supply | Qty: 30 | Fill #0

## 2018-05-19 MED FILL — JANUVIA 50 MG TABLET: 50 | 30 days supply | Qty: 30 | Fill #0

## 2018-05-20 ENCOUNTER — Encounter: Payer: Self-pay | Admitting: Endocrinology

## 2018-05-20 ENCOUNTER — Encounter

## 2018-05-20 ENCOUNTER — Ambulatory Visit (INDEPENDENT_AMBULATORY_CARE_PROVIDER_SITE_OTHER): Payer: Medicaid Other | Admitting: Endocrinology

## 2018-05-20 VITALS — BP 122/60 | HR 91 | Temp 98.8°F | Ht 66.0 in | Wt 300.2 lb

## 2018-05-20 DIAGNOSIS — E1165 Type 2 diabetes mellitus with hyperglycemia: Secondary | ICD-10-CM

## 2018-05-20 DIAGNOSIS — E1140 Type 2 diabetes mellitus with diabetic neuropathy, unspecified: Secondary | ICD-10-CM

## 2018-05-20 DIAGNOSIS — IMO0002 Reserved for concepts with insufficient information to code with codable children: Secondary | ICD-10-CM

## 2018-05-20 LAB — POCT GLYCOSYLATED HEMOGLOBIN (HGB A1C): HEMOGLOBIN A1C: 8.5 % — AB (ref 4.0–5.6)

## 2018-05-20 MED ORDER — INSULIN GLARGINE 100 UNIT/ML SOLOSTAR PEN: 65.0000 [IU] | PEN_INJECTOR | Freq: Every day | SUBCUTANEOUS | 11 refills | Status: DC

## 2018-05-20 MED ORDER — INSULIN ASPART 100 UNIT/ML FLEXPEN: 50.0000 [IU] | PEN_INJECTOR | Freq: Three times a day (TID) | SUBCUTANEOUS | 3 refills | Status: DC

## 2018-05-20 MED FILL — NOVOLOG FLEXPEN SYRINGE: 100 | 30 days supply | Qty: 30 | Fill #0

## 2018-05-20 MED FILL — LANTUS SOLOSTAR 100 UNITS/M: 100 | 22 days supply | Qty: 15 | Fill #0

## 2018-05-20 MED FILL — SPIRONOLACTONE 25 MG TABLET: 25 | 90 days supply | Qty: 135 | Fill #0

## 2018-05-20 NOTE — Patient Instructions (Addendum)
good diet and exercise significantly improve the control of your diabetes.  please let me know if you wish to be referred to a dietician.  high blood sugar is very risky to your health.  you should see an eye doctor and dentist every year.  It is very important to get all recommended vaccinations.  Controlling your blood pressure and cholesterol drastically reduces the damage diabetes does to your body.  Those who smoke should quit.  Please discuss these with your doctor.  check your blood sugar twice a day.  vary the time of day when you check, between before the 3 meals, and at bedtime.  also check if you have symptoms of your blood sugar being too high or too low.  please keep a record of the readings and bring it to your next appointment here (or you can bring the meter itself).  You can write it on any piece of paper.  please call us sooner if your blood sugar goes below 70, or if you have a lot of readings over 200.   We will need to take this complex situation in stages.   For now, please change the insulins to the numbers listed below.  Please continue the same other diabetes medications.   Please call or message [place] next week, to tell us how the blood sugar is doing Please come back for a follow-up appointment in 2 months.      Bariatric Surgery You have so much to gain by losing weight.  You may have already tried every diet and exercise plan imaginable.  And, you may have sought advice from your family physician, too.   Sometimes, in spite of such diligent efforts, you may not be able to achieve long-term results by yourself.  In cases of severe obesity, bariatric or weight loss surgery is a proven method of achieving long-term weight control.  Our Services Our bariatric surgery programs offer our patients new hope and long-term weight-loss solution.  Since introducing our services in 2003, we have conducted more than 2,400 successful procedures.  Our program is designated as a  Programmer, multimedia by the Metabolic and Bariatric Surgery Accreditation and Quality Improvement Program (MBSAQIP), a IT trainer that sets rigorous patient safety and outcome standards.  Our program is also designated as a Ecologist by [employer].   Our exceptional weight-loss surgery team specializes in diagnosis, treatment, follow-up care, and ongoing support for our patients with severe weight loss challenges.  We currently offer laparoscopic sleeve gastrectomy, gastric bypass, and adjustable gastric band (LAP-BAND).    Attend our [place] Choosing to undergo a bariatric procedure is a big decision, and one that should not be taken lightly.  You now have two options in how you learn about weight-loss surgery - in person or online.  Our objective is to ensure you have all of the information that you need to evaluate the advantages and obligations of this life changing procedure.  Please note that you are not alone in this process, and our experienced team is ready to assist and answer all of your questions.  There are several ways to register for a seminar (either on-line or in person): 1)  Call [phone] 2) Go on-line to [facility] and register for either type of seminar.  [email or web address]

## 2018-05-20 NOTE — Progress Notes (Signed)
Subjective:    [name], female    DOB: [birth year], 67 y.o.   MRN: [id number]  HPI pt is referred by Dr Chapman Fitch, for diabetes.  Pt states DM was dx'ed in 1994; she has mild neuropathy of the lower extremities; she is unaware of any associated chronic complications; she has been on insulin since 2006; pt says her diet and exercise are poor; she has never had GDM, pancreatitis, pancreatic surgery, severe hypoglycemia or DKA.  She takes lantus 74/d, novolog (38 units 3 times a day (just before each meal)), januvia, and victoza.  She says cbg's vary from 90-200's.  It is in general higher after a meal than before.   Past Medical History:  Diagnosis Date  . Anxiety   . Carpal tunnel syndrome 06/14/2014  . Chest pain   . CHF (congestive heart failure) ([place])   . Chronic back pain   . Chronic kidney disease 03/2013   failure due to meds  . Diabetes mellitus type II, uncontrolled ([facility])   . Diverticulitis   . Frequent headaches   . Hiatal hernia   . Hyperlipemia   . Hypertension   . Hypothyroidism   . 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 Neuropathy   . Obesity   . Pneumonia   . Vitamin D deficiency 06/05/2014    Past Surgical History:  Procedure Laterality Date  . CESAREAN SECTION    . KNEE SURGERY Left   . THYROID SURGERY     goiter    Social History   Socioeconomic History  . Marital status: Divorced    Spouse name: Not on file  . Number of children: 6  . Years of education: BA  . Highest education level: Not on file  Occupational History  . Occupation: unemployed  Social Needs  . Financial resource strain: Not on file  . Food insecurity:    Worry: Not on file    Inability: Not on file  . Transportation needs:    Medical: Not on file    Non-medical: Not on file  Tobacco Use  . Smoking status: Never Smoker  . Smokeless tobacco: Never Used  Substance and Sexual Activity  . Alcohol use: No  . Drug use: No  . Sexual activity: Never  Lifestyle  . Physical  activity:    Days per week: Not on file    Minutes per session: Not on file  . Stress: Not on file  Relationships  . Social connections:    Talks on phone: Not on file    Gets together: Not on file    Attends religious service: Not on file    Active member of club or organization: Not on file    Attends meetings of clubs or organizations: Not on file    Relationship status: Not on file  . Intimate partner violence:    Fear of current or ex partner: Not on file    Emotionally abused: Not on file    Physically abused: Not on file    Forced sexual activity: Not on file  Other Topics Concern  . Not on file  Social History Narrative  . Not on file    Current Outpatient Medications on File Prior to Visit  Medication Sig Dispense Refill  . ACCU-CHEK SOFTCLIX LANCETS lancets USE AS DIRECTED TO TEST TWICE DAILY 100 each 12  . aspirin EC 81 MG tablet Take 81 mg by mouth daily.    Marland Kitchen atorvastatin (LIPITOR) 40 MG tablet TAKE  1 TABLET BY MOUTH DAILY AT 6 PM. 30 tablet 2  . Blood Glucose Monitoring Suppl (ACCU-CHEK AVIVA PLUS) w/Device KIT USE AS DIRECTED TO TEST TWICE DAILY 1 kit 0  . carvedilol (COREG) 12.5 MG tablet Take a tablet and a half, 18.75 mg, in the morning and one tablet, 12.5 mg, at night. 225 tablet 3  . Cetirizine HCl 10 MG CAPS Take 1 capsule (10 mg total) by mouth daily. 30 capsule 0  . DEXILANT 60 MG capsule TAKE 1 CAPSULE BY MOUTH ONCE DAILY 90 capsule 0  . gabapentin (NEURONTIN) 300 MG capsule TAKE 2 CAPSULES BY MOUTH 2 TIMES DAILY. 120 capsule 2  . glucose blood (ACCU-CHEK AVIVA PLUS) test strip USE AS DIRECTED TO TEST TWICE DAILY 100 each 12  . levothyroxine (SYNTHROID) 100 MCG tablet Take 1 tablet (100 mcg total) by mouth daily before breakfast. 30 tablet 6  . liraglutide (VICTOZA) 18 MG/3ML SOPN 1.8 mg daily 9 mL 3  . lisinopril (PRINIVIL,ZESTRIL) 40 MG tablet TAKE 1 TABLET BY MOUTH DAILY. 90 tablet 3  . mometasone (NASONEX) 50 MCG/ACT nasal spray Place 2 sprays into  the nose daily. 17 g 3  . Multiple Vitamins-Minerals (EYE VITAMINS) CAPS Take 1 capsule by mouth daily.    . NYSTATIN powder APPLY TOPICALLY 4 TIMES DAILY. 15 g 0  . sitaGLIPtin (JANUVIA) 50 MG tablet Take 1 tablet (50 mg total) by mouth daily. 30 tablet 2  . spironolactone (ALDACTONE) 25 MG tablet Take 25 mg, one tablet, in the morning and then 12.5 mg, half a tablet, in the afternoon. 135 tablet 3  . torsemide (DEMADEX) 20 MG tablet Take 1 tablet by mouth daily as needed.  11  . [DISCONTINUED] fenofibrate (TRICOR) 145 MG tablet Take 1 tablet (145 mg total) by mouth daily. 30 tablet 2  . [DISCONTINUED] insulin detemir (LEVEMIR) 100 UNIT/ML injection Inject 60 Units into the skin at bedtime. 10 mL 1  . [DISCONTINUED] metoprolol (LOPRESSOR) 50 MG tablet Take 1 tablet (50 mg total) by mouth 2 (two) times daily. 60 tablet 2   No current facility-administered medications on file prior to visit.     Allergies  Allergen Reactions  . Amoxicillin-Pot Clavulanate Nausea And Vomiting and Other (See Comments)    Combination of medications tear lining of stomach  . Hydrocodone-Acetaminophen Shortness Of Breath  . Oxycodone Shortness Of Breath    oxycontin too  . Sulfa Antibiotics Shortness Of Breath  . Azithromycin   . Norvasc [Amlodipine Besylate] Swelling    feet  . Sulfamethoxazole Nausea Only  . Tramadol Other (See Comments)    Unknown, toxicity. Patient advised she had to be given Narcan with this    Family History  Problem Relation Age of Onset  . Cancer Mother        cervical- mets  . Cancer Father   . Heart disease Father   . Diabetes Father   . Diabetes Sister   . Heart disease Sister   . Heart disease Sister   . COPD Sister     BP 122/60 (BP Location: Left Arm, Patient Position: Sitting, Cuff Size: Normal)   Pulse 91   Temp 98.8 F (37.1 C) (Oral)   Ht '5\' 6"'  (1.676 m)   Wt (!) 300 lb 3.2 oz (136.2 kg)   SpO2 96%   BMI 48.45 kg/m    Review of Systems denies blurry  vision, headache, chest pain, sob, n/v, urinary frequency, muscle cramps, memory loss, cold intolerance, rhinorrhea, hypoglycemia, and  easy bruising.  She has depression, due to recent death in the family.  She has leg cramps and excessive diaphoresis, She has gained 60 lbs x 1 year.       Objective:   Physical Exam VS: see vs page GEN: no distress.  morbid obesithy HEAD: head: no deformity eyes: no periorbital swelling, no proptosis external nose and ears are normal mouth: no lesion seen NECK: a healed scar is present.  I do not appreciate a nodule in the thyroid or elsewhere in the neck.   CHEST WALL: no deformity LUNGS: clear to auscultation CV: reg rate and rhythm, no murmur ABD: abdomen is soft, nontender.  no hepatosplenomegaly.  not distended.  no hernia MUSCULOSKELETAL: muscle bulk and strength are grossly normal.  no obvious joint swelling.  gait is normal and steady EXTEMITIES: no deformity.  no ulcer on the feet.  feet are of normal color and temp.  1+ bilat leg edema PULSES: dorsalis pedis intact bilat.  no carotid bruit NEURO:  cn 2-12 grossly intact.   readily moves all 4's.  sensation is intact to touch on the feet SKIN:  Normal texture and temperature.  No rash or suspicious lesion is visible.   NODES:  None palpable at the neck PSYCH: alert, well-oriented.  Does not appear anxious nor depressed.   Lab Results  Component Value Date   HGBA1C 8.7 (A) 04/29/2018    Lab Results  Component Value Date   CREATININE 0.92 04/29/2018   BUN 19 04/29/2018   NA 142 04/29/2018   K 4.8 04/29/2018   CL 107 (H) 04/29/2018   CO2 21 04/29/2018   I have reviewed outside records, and summarized: Pt was noted to have elevated a1c, and referred here.  Other probs addressed were HTN, edema, and BRBPR.      Assessment & Plan:  Insulin-requiring type 2 DM, with polyneuropathy: she needs increased rx.  Obesity: new to me.  She would benefit from surgery  Patient Instructions    good diet and exercise significantly improve the control of your diabetes.  please let me know if you wish to be referred to a dietician.  high blood sugar is very risky to your health.  you should see an eye doctor and dentist every year.  It is very important to get all recommended vaccinations.  Controlling your blood pressure and cholesterol drastically reduces the damage diabetes does to your body.  Those who smoke should quit.  Please discuss these with your doctor.  check your blood sugar twice a day.  vary the time of day when you check, between before the 3 meals, and at bedtime.  also check if you have symptoms of your blood sugar being too high or too low.  please keep a record of the readings and bring it to your next appointment here (or you can bring the meter itself).  You can write it on any piece of paper.  please call us sooner if your blood sugar goes below 70, or if you have a lot of readings over 200.   We will need to take this complex situation in stages.   For now, please change the insulins to the numbers listed below.  Please continue the same other diabetes medications.   Please call or message [place] next week, to tell us how the blood sugar is doing Please come back for a follow-up appointment in 2 months.      Bariatric Surgery You have so much to gain  by losing weight.  You may have already tried every diet and exercise plan imaginable.  And, you may have sought advice from your family physician, too.   Sometimes, in spite of such diligent efforts, you may not be able to achieve long-term results by yourself.  In cases of severe obesity, bariatric or weight loss surgery is a proven method of achieving long-term weight control.  Our Services Our bariatric surgery programs offer our patients new hope and long-term weight-loss solution.  Since introducing our services in 2003, we have conducted more than 2,400 successful procedures.  Our program is designated as a  Programmer, multimedia by the Metabolic and Bariatric Surgery Accreditation and Quality Improvement Program (MBSAQIP), a IT trainer that sets rigorous patient safety and outcome standards.  Our program is also designated as a Ecologist by [employer].   Our exceptional weight-loss surgery team specializes in diagnosis, treatment, follow-up care, and ongoing support for our patients with severe weight loss challenges.  We currently offer laparoscopic sleeve gastrectomy, gastric bypass, and adjustable gastric band (LAP-BAND).    Attend our [place] Choosing to undergo a bariatric procedure is a big decision, and one that should not be taken lightly.  You now have two options in how you learn about weight-loss surgery - in person or online.  Our objective is to ensure you have all of the information that you need to evaluate the advantages and obligations of this life changing procedure.  Please note that you are not alone in this process, and our experienced team is ready to assist and answer all of your questions.  There are several ways to register for a seminar (either on-line or in person): 1)  Call [phone] 2) Go on-line to [facility] and register for either type of seminar.  [email or web address]

## 2018-05-27 MED FILL — GABAPENTIN 300 MG CAPSULE: 300 | 30 days supply | Qty: 120 | Fill #2

## 2018-05-30 ENCOUNTER — Ambulatory Visit: Payer: Medicaid Other | Admitting: Family Medicine

## 2018-06-02 ENCOUNTER — Ambulatory Visit: Payer: Medicaid Other | Admitting: Family Medicine

## 2018-06-14 ENCOUNTER — Other Ambulatory Visit: Payer: Self-pay | Admitting: Internal Medicine

## 2018-06-14 ENCOUNTER — Ambulatory Visit: Payer: Medicaid Other | Attending: Family Medicine | Admitting: Family Medicine

## 2018-06-14 ENCOUNTER — Encounter: Payer: Self-pay | Admitting: Family Medicine

## 2018-06-14 VITALS — BP 128/79 | HR 103 | Temp 98.5°F | Resp 16 | Ht 66.0 in | Wt 306.2 lb

## 2018-06-14 DIAGNOSIS — Z882 Allergy status to sulfonamides status: Secondary | ICD-10-CM | POA: Diagnosis not present

## 2018-06-14 DIAGNOSIS — E1122 Type 2 diabetes mellitus with diabetic chronic kidney disease: Secondary | ICD-10-CM | POA: Insufficient documentation

## 2018-06-14 DIAGNOSIS — Z9989 Dependence on other enabling machines and devices: Secondary | ICD-10-CM

## 2018-06-14 DIAGNOSIS — Z881 Allergy status to other antibiotic agents status: Secondary | ICD-10-CM | POA: Diagnosis not present

## 2018-06-14 DIAGNOSIS — I130 Hypertensive heart and chronic kidney disease with heart failure and stage 1 through stage 4 chronic kidney disease, or unspecified chronic kidney disease: Secondary | ICD-10-CM | POA: Diagnosis not present

## 2018-06-14 DIAGNOSIS — Z794 Long term (current) use of insulin: Secondary | ICD-10-CM | POA: Insufficient documentation

## 2018-06-14 DIAGNOSIS — Z880 Allergy status to penicillin: Secondary | ICD-10-CM | POA: Insufficient documentation

## 2018-06-14 DIAGNOSIS — G8929 Other chronic pain: Secondary | ICD-10-CM | POA: Insufficient documentation

## 2018-06-14 DIAGNOSIS — I5032 Chronic diastolic (congestive) heart failure: Secondary | ICD-10-CM

## 2018-06-14 DIAGNOSIS — Z8249 Family history of ischemic heart disease and other diseases of the circulatory system: Secondary | ICD-10-CM | POA: Insufficient documentation

## 2018-06-14 DIAGNOSIS — G4733 Obstructive sleep apnea (adult) (pediatric): Secondary | ICD-10-CM

## 2018-06-14 DIAGNOSIS — IMO0002 Reserved for concepts with insufficient information to code with codable children: Secondary | ICD-10-CM

## 2018-06-14 DIAGNOSIS — Z9889 Other specified postprocedural states: Secondary | ICD-10-CM | POA: Insufficient documentation

## 2018-06-14 DIAGNOSIS — R0602 Shortness of breath: Secondary | ICD-10-CM | POA: Diagnosis not present

## 2018-06-14 DIAGNOSIS — Z833 Family history of diabetes mellitus: Secondary | ICD-10-CM | POA: Insufficient documentation

## 2018-06-14 DIAGNOSIS — N189 Chronic kidney disease, unspecified: Secondary | ICD-10-CM | POA: Insufficient documentation

## 2018-06-14 DIAGNOSIS — Z809 Family history of malignant neoplasm, unspecified: Secondary | ICD-10-CM | POA: Diagnosis not present

## 2018-06-14 DIAGNOSIS — E1140 Type 2 diabetes mellitus with diabetic neuropathy, unspecified: Secondary | ICD-10-CM

## 2018-06-14 DIAGNOSIS — R0600 Dyspnea, unspecified: Secondary | ICD-10-CM

## 2018-06-14 DIAGNOSIS — M7989 Other specified soft tissue disorders: Secondary | ICD-10-CM | POA: Insufficient documentation

## 2018-06-14 DIAGNOSIS — Z6841 Body Mass Index (BMI) 40.0 and over, adult: Secondary | ICD-10-CM

## 2018-06-14 DIAGNOSIS — K219 Gastro-esophageal reflux disease without esophagitis: Secondary | ICD-10-CM

## 2018-06-14 DIAGNOSIS — E1165 Type 2 diabetes mellitus with hyperglycemia: Secondary | ICD-10-CM

## 2018-06-14 DIAGNOSIS — Z825 Family history of asthma and other chronic lower respiratory diseases: Secondary | ICD-10-CM | POA: Insufficient documentation

## 2018-06-14 DIAGNOSIS — Z888 Allergy status to other drugs, medicaments and biological substances status: Secondary | ICD-10-CM | POA: Insufficient documentation

## 2018-06-14 DIAGNOSIS — Z885 Allergy status to narcotic agent status: Secondary | ICD-10-CM | POA: Insufficient documentation

## 2018-06-14 LAB — GLUCOSE, POCT (MANUAL RESULT ENTRY): POC Glucose: 149 mg/dL — AB (ref 70–99)

## 2018-06-14 MED FILL — DEXILANT DR 60 MG CAPSULE: 60 | 30 days supply | Qty: 30 | Fill #0

## 2018-06-14 MED FILL — ACCU-CHEK FASTCLIX LANCETS: 51 days supply | Qty: 102 | Fill #1

## 2018-06-14 MED FILL — ACCU-CHEK AVIVA PLUS TEST S: 50 days supply | Qty: 100 | Fill #1

## 2018-06-14 MED FILL — SYNTHROID 100 MCG TABLET: 100 | 30 days supply | Qty: 30 | Fill #6

## 2018-06-14 MED FILL — LISINOPRIL 40 MG TABLET: 40 | 30 days supply | Qty: 30 | Fill #3

## 2018-06-14 MED FILL — NOVOLOG FLEXPEN SYRINGE: 100 | 30 days supply | Qty: 30 | Fill #1

## 2018-06-14 MED FILL — CARVEDILOL 12.5 MG TABLET: 12.5 | 30 days supply | Qty: 75 | Fill #6

## 2018-06-14 MED FILL — TORSEMIDE 20 MG TABLET: 20 | 30 days supply | Qty: 30 | Fill #1

## 2018-06-14 MED FILL — ATORVASTATIN 40 MG TABLET: 40 | 30 days supply | Qty: 30 | Fill #1

## 2018-06-14 MED FILL — LANTUS SOLOSTAR 100 UNITS/M: 100 | 22 days supply | Qty: 15 | Fill #1

## 2018-06-14 MED FILL — JANUVIA 50 MG TABLET: 50 | 30 days supply | Qty: 30 | Fill #1

## 2018-06-14 NOTE — Patient Instructions (Signed)
Heart Failure °Heart failure means your heart has trouble pumping blood. This makes it hard for your body to work well. Heart failure is usually a long-term (chronic) condition. You must take good care of yourself and follow your doctor's treatment plan. °Follow these instructions at home: °· Take your heart medicine as told by your doctor. °? Do not stop taking medicine unless your doctor tells you to. °? Do not skip any dose of medicine. °? Refill your medicines before they run out. °? Take other medicines only as told by your doctor or pharmacist. °· Stay active if told by your doctor. The elderly and people with severe heart failure should talk with a doctor about physical activity. °· Eat heart-healthy foods. Choose foods that are without trans fat and are low in saturated fat, cholesterol, and salt (sodium). This includes fresh or frozen fruits and vegetables, fish, lean meats, fat-free or low-fat dairy foods, whole grains, and high-fiber foods. Lentils and dried peas and beans (legumes) are also good choices. °· Limit salt if told by your doctor. °· Cook in a healthy way. Roast, grill, broil, bake, poach, steam, or stir-fry foods. °· Limit fluids as told by your doctor. °· Weigh yourself every morning. Do this after you pee (urinate) and before you eat breakfast. Write down your weight to give to your doctor. °· Take your blood pressure and write it down if your doctor tells you to. °· Ask your doctor how to check your pulse. Check your pulse as told. °· Lose weight if told by your doctor. °· Stop smoking or chewing tobacco. Do not use gum or patches that help you quit without your doctor's approval. °· Schedule and go to doctor visits as told. °· Nonpregnant women should have no more than 1 drink a day. Men should have no more than 2 drinks a day. Talk to your doctor about drinking alcohol. °· Stop illegal drug use. °· Stay current with shots (immunizations). °· Manage your health conditions as told by your  doctor. °· Learn to manage your stress. °· Rest when you are tired. °· If it is really hot outside: °? Avoid intense activities. °? Use air conditioning or fans, or get in a cooler place. °? Avoid caffeine and alcohol. °? Wear loose-fitting, lightweight, and light-colored clothing. °· If it is really cold outside: °? Avoid intense activities. °? Layer your clothing. °? Wear mittens or gloves, a hat, and a scarf when going outside. °? Avoid alcohol. °· Learn about heart failure and get support as needed. °· Get help to maintain or improve your quality of life and your ability to care for yourself as needed. °Contact a doctor if: °· You gain weight quickly. °· You are more short of breath than usual. °· You cannot do your normal activities. °· You tire easily. °· You cough more than normal, especially with activity. °· You have any or more puffiness (swelling) in areas such as your hands, feet, ankles, or belly (abdomen). °· You cannot sleep because it is hard to breathe. °· You feel like your heart is beating fast (palpitations). °· You get dizzy or light-headed when you stand up. °Get help right away if: °· You have trouble breathing. °· There is a change in mental status, such as becoming less alert or not being able to focus. °· You have chest pain or discomfort. °· You faint. °This information is not intended to replace advice given to you by your health care provider. Make sure you   discuss any questions you have with your health care provider. °Document Released: 06/16/2008 Document Revised: 02/13/2016 Document Reviewed: 10/24/2012 °Elsevier Interactive Patient Education © 2017 [employer] ° °

## 2018-06-15 ENCOUNTER — Telehealth: Payer: Self-pay

## 2018-06-15 ENCOUNTER — Other Ambulatory Visit: Payer: Self-pay | Admitting: Internal Medicine

## 2018-06-15 DIAGNOSIS — E1140 Type 2 diabetes mellitus with diabetic neuropathy, unspecified: Secondary | ICD-10-CM

## 2018-06-15 DIAGNOSIS — Z794 Long term (current) use of insulin: Secondary | ICD-10-CM

## 2018-06-15 DIAGNOSIS — E118 Type 2 diabetes mellitus with unspecified complications: Secondary | ICD-10-CM

## 2018-06-15 LAB — BASIC METABOLIC PANEL WITH GFR
BUN/Creatinine Ratio: 18 (ref 12–28)
BUN: 16 mg/dL (ref 8–27)
CO2: 20 mmol/L (ref 20–29)
Calcium: 9.4 mg/dL (ref 8.7–10.3)
Chloride: 109 mmol/L — ABNORMAL HIGH (ref 96–106)
Creatinine, Ser: 0.9 mg/dL (ref 0.57–1.00)
GFR calc Af Amer: 78 mL/min/1.73
GFR calc non Af Amer: 68 mL/min/1.73
Glucose: 138 mg/dL — ABNORMAL HIGH (ref 65–99)
Potassium: 5.5 mmol/L — ABNORMAL HIGH (ref 3.5–5.2)
Sodium: 142 mmol/L (ref 134–144)

## 2018-06-15 LAB — BRAIN NATRIURETIC PEPTIDE: BNP: 14.8 pg/mL (ref 0.0–100.0)

## 2018-06-15 NOTE — Telephone Encounter (Signed)
Call placed to the patient at the request of Dr Chapman Fitch to inquire about a problem with her CPAP machine.  The patient was upset that she needed to provide her birth date as a patient identifier.  She explained that her CPAP machine is not working correctly. This was a machine from the [employer] that [facility] provided to her because she did not have any insurance at that time. There are no guarantees that come with the CPAP machines from the [employer]   She now has medicaid. Informed her that a new machine can be ordered for her through a DME provider like [facility] now that she has insurance.  Explained to her that Dr Chapman Fitch would be notified of this request for a new CPAP.

## 2018-06-16 ENCOUNTER — Telehealth: Payer: Self-pay

## 2018-06-16 NOTE — Telephone Encounter (Signed)
Please ask patient what size Depends she wears and where do I need to send the prescription for the Depends. She can us/try  over the counter hydrocortisone to see if this helps the itchy place on her arm

## 2018-06-16 NOTE — Telephone Encounter (Signed)
Returned pt call to to go over Dr. Chapman Fitch response pt didn't answer and was unable to lvm

## 2018-06-16 NOTE — Telephone Encounter (Signed)
Rx was written earlier today to be faxed to [facility] to order a CPAP machine for the patient.

## 2018-06-16 NOTE — Telephone Encounter (Signed)
Contacted pt to go over lab results pt is aware  Pt only question is what is making her k go up?

## 2018-06-16 NOTE — Progress Notes (Signed)
Subjective:    [name], female    DOB: Jun 18, 1951, 67 y.o.   MRN: [id number]  HPI 67 year old female who presents secondary to complaint of increased shortness of breath.  Patient states that she has been taking her medications as prescribed.  Patient reports that she was not told to take her fluid medication on a daily basis but rather as needed.  Patient states that she has not recently taken her fluid medication, torsemide, because of concerns regarding her kidney function.  Patient states that fluid pills will cause her kidneys to shut down and she does not want this to happen.  Patient is having some shortness of breath at rest as well as increased shortness of breath with activity.  Patient however states that she is able to lie down without shortness of breath but states that she has been having recent issues with her CPAP machine.  Patient states that sometimes her CPAP machine causes her to have increased nasal congestion.  Patient states that she is not sure if the humidification system is working on her CPAP as sometimes she has to get up several times per night to add additional water to her CPAP reservoir and at other times it looks like no water has been used at all.  Patient states that sometimes looks as if there is water droplets inside of her CPAP hose.  Patient states that she is compliant with use of CPAP and uses it every night because she states that she was told that she might die if she did not use her CPAP.      Patient reports that she did see the endocrinologist since her last visit here.  Per chart, patient saw the endocrinologist on 05/20/2018.  Patient states that all she remembers is that the endocrinologist told her that she was obese and needed to lose weight and he wanted her to have a LAP-BAND surgery.  Patient states that she was supposed to have been referred for surgery in the past over a year ago but states that she never really heard anything regarding  the referral.  Patient admits that she recently was contacted in order to go to weight loss seminar but states that a friend of hers had recently passed away and patient has been upset about her friends passing and patient therefore did not go to the weight loss seminar.      Patient admits that she has not really been checking her blood sugars.  Patient states that she believes the endocrinologist told her to adjust her medications/insulin but patient has not made all of the changes yet.  Patient states that her blood sugars are up and down.  Patient denies any significant hypoglycemic episodes as she states that her blood sugars are more often elevated.  Blood sugars are in the mid 100's to 200's.  Patient continues to have issues with pain and burning in her feet.  Patient is taking the Neurontin which does help somewhat.  Patient states that the Neurontin can help reduce the burning, stinging pain in her legs and feet from a 9 or 10 down to a 6 or 7. Past Medical History:  Diagnosis Date  . Anxiety   . Carpal tunnel syndrome 06/14/2014  . Chest pain   . CHF (congestive heart failure) ([place])   . Chronic back pain   . Chronic kidney disease 03/2013   failure due to meds  . Diabetes mellitus type II, uncontrolled ([place])   . Diverticulitis   .  Frequent headaches   . Hiatal hernia   . Hyperlipemia   . Hypertension   . Hypothyroidism   . 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 Neuropathy   . Obesity   . Pneumonia   . Vitamin D deficiency 06/05/2014   Past Surgical History:  Procedure Laterality Date  . CESAREAN SECTION    . KNEE SURGERY Left   . THYROID SURGERY     goiter   Family History  Problem Relation Age of Onset  . Cancer Mother        cervical- mets  . Cancer Father   . Heart disease Father   . Diabetes Father   . Diabetes Sister   . Heart disease Sister   . Heart disease Sister   . COPD Sister    Social History   Tobacco Use  . Smoking status: Never Smoker  . Smokeless  tobacco: Never Used  Substance Use Topics  . Alcohol use: No  . Drug use: No   Allergies  Allergen Reactions  . Amoxicillin-Pot Clavulanate Nausea And Vomiting and Other (See Comments)    Combination of medications tear lining of stomach  . Hydrocodone-Acetaminophen Shortness Of Breath  . Oxycodone Shortness Of Breath    oxycontin too  . Sulfa Antibiotics Shortness Of Breath  . Azithromycin   . Norvasc [Amlodipine Besylate] Swelling    feet  . Sulfamethoxazole Nausea Only  . Tramadol Other (See Comments)    Unknown, toxicity. Patient advised she had to be given Narcan with this      Review of Systems  Constitutional: Positive for fatigue. Negative for chills and fever.  Respiratory: Positive for shortness of breath. Negative for cough.   Cardiovascular: Positive for leg swelling. Negative for chest pain and palpitations.  Gastrointestinal: Negative for abdominal pain and nausea.  Genitourinary: Positive for frequency.  Musculoskeletal: Positive for arthralgias, gait problem and myalgias. Negative for joint swelling.  Neurological: Positive for numbness. Negative for dizziness and headaches.       Objective:   Physical Exam BP 128/79   Pulse (!) 103   Temp 98.5 F (36.9 C) (Oral)   Resp 16   Ht 5\' 6"  (1.676 m)   Wt (!) 306 lb 3.2 oz (138.9 kg)   SpO2 96%   BMI 49.42 kg/m  Vital signs and nurse's notes reviewed General-well-nourished, well-developed, morbidly obese older female in no acute distress Neck-supple, no lymphadenopathy, no JVD, no thyromegaly Lungs-clear to auscultation bilaterally with decreased breath sounds at the lung bases but no rales or wheezing Cardiovascular-regular rate and rhythm Abdomen- truncal obesity, soft and nontender Back-no CVA tenderness, patient does have some mild lumbosacral discomfort with palpation Extremities- patient with 1+ pitting edema in the lower extremities bilaterally      Assessment & Plan:  1. Type 2 diabetes,  uncontrolled, with neuropathy ([place]) Patient's recent endocrinology note was reviewed and patient was encouraged to make the changes in her insulin regimen as recommended and begin to monitor her blood sugars on a regular basis. Patient may get some improvement in her neuropathy if her blood sugars are better controlled. Patient should continue the use of gabapentin. Patient will have BMP in follow-up of her diabetes and random blood sugar was 147. - Basic Metabolic Panel - Brain natriuretic peptide - Glucose (CBG)  2. Dyspnea, unspecified type Patient with the complaint of recent increase in shortness of breath.  Patient reports that she has not been taking her fluid medication on a regular basis as she  is afraid of renal impairment secondary to the use of diuretic medication.  Patient does have past diagnosis of congestive heart failure.  Patient will have BNP done in follow-up to see if this is elevated which could indicate CHF exacerbation however BNP may be falsely low in people with morbid obesity.  Patient is encouraged to take her torsemide daily for the next 3 days to help with the swelling to see if this improves her shortness of breath.  If patient has acute increase in shortness of breath or increased peripheral edema, patient may need to go to the emergency department for further evaluation.  - Brain natriuretic peptide  3. Chronic diastolic congestive heart failure [facility]) Patient with prior diagnosis of congestive heart failure but states that cardiology did not tell her that she needed to take her torsemide daily but only as needed.  Patient states that even that she is aware that she is having increased swelling in her legs, she has been afraid to take the torsemide secondary to fears of renal impairment secondary to use of diuretic medication.  Discussed with patient the importance of removing excess fluid to help prevent acute CHF exacerbation with pulmonary congestion.  Discussed with  the patient as well that changes in renal function related to use of diuretics is often reversible once diuretics are stopped or patient is rehydrated.  Patient however also needs to follow fluid restrictions to help prevent continued peripheral edema.  Patient is also advised to make sure that she is following a low-sodium diet.  Patient will have BMP and BNP and follow-up of CHF. - Basic Metabolic Panel - Brain natriuretic peptide  4. Obstructive sleep apnea treated with CPAP Patient with a concern of her CPAP machine possibly not functioning properly as she states that at times, her CPAP reservoir seems to require more water than at other times.  Patient wonders if she is eligible for a new CPAP machine.  Patient reports that she has remained compliant with the use of CPAP on a nightly basis secondary to fear of dying if she does not use her CPAP while asleep.  5. Morbid Obesity with BMI of 45 Discussed with patient that when she is ready she can call the surgery office to attend the initial seminar because she will need to attend the seminar as her first step.  Patient states that she has been upset due to the recent death of her friend and this is why she has not attended the weight loss seminar or made significant changes regarding the control of her diabetes.  *Patient was offered influenza immunization at today's visit which she declined  An After Visit Summary was printed and given to the patient.  Return if symptoms worsen or fail to improve, for 1-2 weeks if still SOB.

## 2018-06-16 NOTE — Telephone Encounter (Signed)
I am not sure at this time. It could be related to her diet or other medications or due to her kidney function. Did she ever follow-up with a kidney specialist? I would like for her to return to have her potassium level rechecked

## 2018-06-16 NOTE — Telephone Encounter (Signed)
Pt contacted the office and stated she is needing a rx for depends and pt is wanting to know what is a good cream or something for a itch on the arm

## 2018-06-16 NOTE — Telephone Encounter (Signed)
Order for CPAP faxed to [facility] along with demographics and copy of sleep study report. Will send copy of recent  visit note with Dr Chapman Fitch when completed.

## 2018-06-17 NOTE — Telephone Encounter (Signed)
Faxed information to [facility]

## 2018-06-17 NOTE — Telephone Encounter (Signed)
Please obtain completed note from patient's chart

## 2018-06-20 ENCOUNTER — Telehealth: Payer: Self-pay

## 2018-06-20 ENCOUNTER — Other Ambulatory Visit: Payer: Self-pay | Admitting: Family Medicine

## 2018-06-20 DIAGNOSIS — Z9989 Dependence on other enabling machines and devices: Principal | ICD-10-CM

## 2018-06-20 DIAGNOSIS — G4733 Obstructive sleep apnea (adult) (pediatric): Secondary | ICD-10-CM

## 2018-06-20 NOTE — Telephone Encounter (Signed)
As an FYI, I received a phone message from patient stating that she needed to have a new sleep study before [facility] can fill her CPAP order. I placed an order for a sleep study at [facility]. I did not know if this is something that you needed to follow up on.

## 2018-06-20 NOTE — Telephone Encounter (Signed)
Notify patient that the order was placed. She should be contacted by the sleep lab at [facility]

## 2018-06-20 NOTE — Progress Notes (Signed)
See phone encounter. Patient needs a more recent sleep study in order to obtain a new CPAP and supplies from [facility]. Sleep order placed

## 2018-06-20 NOTE — Telephone Encounter (Signed)
Pt contacted the office and stated  That advanced contacted her this morning and stated she is needing a new sleep study in order to get a new machine and all the pieces that she is needing for it   Pt states she is needing a sleep study as soon as possible

## 2018-06-21 NOTE — Telephone Encounter (Signed)
OK, please let me know if there is anything else that I need to do for this patient.

## 2018-06-24 ENCOUNTER — Ambulatory Visit: Payer: Medicaid Other | Admitting: Gastroenterology

## 2018-06-24 ENCOUNTER — Other Ambulatory Visit: Payer: Self-pay | Admitting: Family Medicine

## 2018-06-24 DIAGNOSIS — G4733 Obstructive sleep apnea (adult) (pediatric): Secondary | ICD-10-CM

## 2018-06-24 DIAGNOSIS — Z9989 Dependence on other enabling machines and devices: Principal | ICD-10-CM

## 2018-06-24 NOTE — Telephone Encounter (Signed)
I placed a new order for a split sleep study to be done at [facility].

## 2018-06-24 NOTE — Progress Notes (Signed)
[name], female   DOB: April 03, 1951, 67 y.o.   MRN: [id number]   New order placed for split night sleep study as patient needed to have titration of CPAP done on same night as her sleep study as she currently uses a CPAP but needs a new machine and supplies.

## 2018-06-29 ENCOUNTER — Ambulatory Visit (HOSPITAL_BASED_OUTPATIENT_CLINIC_OR_DEPARTMENT_OTHER): Payer: Medicaid Other | Attending: Internal Medicine | Admitting: Internal Medicine

## 2018-06-29 ENCOUNTER — Telehealth: Payer: Self-pay

## 2018-06-29 VITALS — Ht 66.0 in | Wt 304.0 lb

## 2018-06-29 DIAGNOSIS — G4733 Obstructive sleep apnea (adult) (pediatric): Secondary | ICD-10-CM

## 2018-06-29 DIAGNOSIS — Z9989 Dependence on other enabling machines and devices: Secondary | ICD-10-CM | POA: Insufficient documentation

## 2018-06-29 NOTE — Telephone Encounter (Signed)
Call placed to [facility]. She confirmed that they have the order for the split night sleep study and will call the patient today to schedule.

## 2018-07-05 ENCOUNTER — Telehealth: Payer: Self-pay | Admitting: Family Medicine

## 2018-07-05 NOTE — Telephone Encounter (Signed)
Patient called stating that she needs results by the end of the day because she needs a new CPAP

## 2018-07-05 NOTE — Telephone Encounter (Signed)
I will forward note to RN who has been assisting with patient's CPAP orders and also notify her in person regarding patient's call

## 2018-07-05 NOTE — Telephone Encounter (Signed)
Patients procedure was completed last night. It has not been interpreted by the clinical reviewer, nor has it been routed to PCP for reviewing. Please inform patient of receiving a phone call once all respectable parties have reviewed the results and they are resulted to be shared.

## 2018-07-05 NOTE — Telephone Encounter (Signed)
Please call patient back about the lab results

## 2018-07-09 DIAGNOSIS — G4733 Obstructive sleep apnea (adult) (pediatric): Secondary | ICD-10-CM

## 2018-07-09 DIAGNOSIS — Z9989 Dependence on other enabling machines and devices: Secondary | ICD-10-CM

## 2018-07-09 NOTE — Procedures (Signed)
Patient Name: Anita James, Anita James Date: 06/29/2018 Gender: Female D.O.B: 1950-12-15 Age (years): 10 Referring Provider: Cammie Fulp Height (inches): 66 Interpreting Physician: Baird Lyons MD, ABSM Weight (lbs): 304 RPSGT: Zadie Rhine BMI: 49 MRN: [id number] Neck Size: 17.50  CLINICAL INFORMATION Sleep Study Type: NPSG Indication for sleep study: Obesity, OSA, Snoring Epworth Sleepiness Score: 14  Most recent polysomnogram dated 04/01/2016 revealed an AHI of 13.1/h and RDI of 15.6/h. Most recent titration study dated 08/20/2016 was optimal at 17cm H2O with an AHI of 1.9/h.  SLEEP STUDY TECHNIQUE As per the AASM Manual for the Scoring of Sleep and Associated Events v2.3 (April 2016) with a hypopnea requiring 4% desaturations.  The channels recorded and monitored were frontal, central and occipital EEG, electrooculogram (EOG), submentalis EMG (chin), nasal and oral airflow, thoracic and abdominal wall motion, anterior tibialis EMG, snore microphone, electrocardiogram, and pulse oximetry.  MEDICATIONS Medications self-administered by patient taken the night of the study : METOPROLOL, GABAPENTIN, EXTRA STRENGTH TYLENOL, INSULIN GLARGINE, ATORVASTATIN, CARVEDILOL  SLEEP ARCHITECTURE The study was initiated at 9:45:09 PM and ended at 3:39:11 AM.  Sleep onset time was 40.1 minutes and the sleep efficiency was 67.2%%. The total sleep time was 238 minutes.  Stage REM latency was N/A minutes.  The patient spent 13.2%% of the night in stage N1 sleep, 84.5%% in stage N2 sleep, 2.3%% in stage N3 and 0% in REM.  Alpha intrusion was absent.  Supine sleep was 0.00%.  RESPIRATORY PARAMETERS The overall apnea/hypopnea index (AHI) was 1.5 per hour. There were 0 total apneas, including 0 obstructive, 0 central and 0 mixed apneas. There were 6 hypopneas and 2 RERAs.  The AHI during Stage REM sleep was N/A per hour.  AHI while supine was N/A per hour.  The mean oxygen saturation was  91.2%. The minimum SpO2 during sleep was 84.0%.  soft snoring was noted during this study.  CARDIAC DATA The 2 lead EKG demonstrated sinus rhythm. The mean heart rate was 95.6 beats per minute. Other EKG findings include: None.  LEG MOVEMENT DATA The total PLMS were 0 with a resulting PLMS index of 0.0. Associated arousal with leg movement index was 0.0 .  IMPRESSIONS - No significant obstructive sleep apnea occurred during this study (AHI = 1.5/h). - No significant central sleep apnea occurred during this study (CAI = 0.0/h). - Mild oxygen desaturation was noted during this study (Min O2 = 84.0%). Mean 91.2%. Time - The patient snored with soft snoring volume. - No cardiac abnormalities were noted during this study.  - Clinically significant periodic limb movements did not occur during sleep. No significant associated arousals. 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 - Sleep hygiene should be reviewed to assess factors that may improve sleep quality. - Weight management and regular exercise should be initiated or continued if appropriate.  [Electronically signed] 07/09/2018 12:23 PM  Baird Lyons MD, [place], American Board of Sleep Medicine   NPI: [id number]                           [place], [facility]  ELECTRONICALLY SIGNED ON:  07/09/2018, 12:10 PM [place] PH: [phone]   FX: [phone]) [place]  SLEEP MEDICINE

## 2018-07-11 ENCOUNTER — Telehealth: Payer: Self-pay

## 2018-07-11 NOTE — Telephone Encounter (Signed)
Call placed to the patient and informed her of the results of her sleep study. Based on the test results she does not meet the criteria for sleep apnea and does not  need to use a CPAP machine.  She said that the test was not accurate because she was coughing all night.  Informed her that she can discuss her concerns with Dr Wynetta Emery at her appointment on 07/14/18.

## 2018-07-12 MED FILL — LANTUS SOLOSTAR 100 UNITS/M: 100 | 22 days supply | Qty: 15 | Fill #2

## 2018-07-13 NOTE — Telephone Encounter (Signed)
-----   Message from Antony Blackbird, MD sent at 07/10/2018  3:44 PM EDT ----- Please notify patient that based on her most recent sleep study, the test did not meet the criteria for sleep apnea. Based on the test results she does not need to use a CPAP machine as the study does not indicate that the criteria are present for the diagnosis of sleep apnea

## 2018-07-13 NOTE — Telephone Encounter (Signed)
Patient was called in attempt to give sleep study results, no answer, lvm to return call.   Please notify patient that based on her most recent sleep study, the test did not meet the criteria for sleep apnea. Based on the test results she does not need to use a CPAP machine as the study does not indicate that the criteria are present for the diagnosis of sleep apnea

## 2018-07-14 ENCOUNTER — Ambulatory Visit: Payer: Medicaid Other | Attending: Internal Medicine | Admitting: Internal Medicine

## 2018-07-14 ENCOUNTER — Encounter: Payer: Self-pay | Admitting: Internal Medicine

## 2018-07-14 ENCOUNTER — Other Ambulatory Visit: Payer: Self-pay | Admitting: Internal Medicine

## 2018-07-14 VITALS — BP 119/89 | HR 94 | Temp 98.2°F | Resp 16 | Wt 303.6 lb

## 2018-07-14 DIAGNOSIS — E1142 Type 2 diabetes mellitus with diabetic polyneuropathy: Secondary | ICD-10-CM | POA: Insufficient documentation

## 2018-07-14 DIAGNOSIS — Z881 Allergy status to other antibiotic agents status: Secondary | ICD-10-CM | POA: Insufficient documentation

## 2018-07-14 DIAGNOSIS — F329 Major depressive disorder, single episode, unspecified: Secondary | ICD-10-CM | POA: Diagnosis not present

## 2018-07-14 DIAGNOSIS — IMO0002 Reserved for concepts with insufficient information to code with codable children: Secondary | ICD-10-CM

## 2018-07-14 DIAGNOSIS — Z7689 Persons encountering health services in other specified circumstances: Secondary | ICD-10-CM | POA: Insufficient documentation

## 2018-07-14 DIAGNOSIS — R5381 Other malaise: Secondary | ICD-10-CM

## 2018-07-14 DIAGNOSIS — G4733 Obstructive sleep apnea (adult) (pediatric): Secondary | ICD-10-CM | POA: Diagnosis not present

## 2018-07-14 DIAGNOSIS — Z794 Long term (current) use of insulin: Secondary | ICD-10-CM | POA: Insufficient documentation

## 2018-07-14 DIAGNOSIS — Z833 Family history of diabetes mellitus: Secondary | ICD-10-CM | POA: Diagnosis not present

## 2018-07-14 DIAGNOSIS — Z885 Allergy status to narcotic agent status: Secondary | ICD-10-CM | POA: Diagnosis not present

## 2018-07-14 DIAGNOSIS — K219 Gastro-esophageal reflux disease without esophagitis: Secondary | ICD-10-CM

## 2018-07-14 DIAGNOSIS — Z6841 Body Mass Index (BMI) 40.0 and over, adult: Secondary | ICD-10-CM | POA: Diagnosis not present

## 2018-07-14 DIAGNOSIS — E1140 Type 2 diabetes mellitus with diabetic neuropathy, unspecified: Secondary | ICD-10-CM

## 2018-07-14 DIAGNOSIS — I5032 Chronic diastolic (congestive) heart failure: Secondary | ICD-10-CM | POA: Diagnosis not present

## 2018-07-14 DIAGNOSIS — Z79899 Other long term (current) drug therapy: Secondary | ICD-10-CM | POA: Diagnosis not present

## 2018-07-14 DIAGNOSIS — Z888 Allergy status to other drugs, medicaments and biological substances status: Secondary | ICD-10-CM | POA: Insufficient documentation

## 2018-07-14 DIAGNOSIS — Z882 Allergy status to sulfonamides status: Secondary | ICD-10-CM | POA: Insufficient documentation

## 2018-07-14 DIAGNOSIS — I110 Hypertensive heart disease with heart failure: Secondary | ICD-10-CM | POA: Diagnosis not present

## 2018-07-14 DIAGNOSIS — E66813 Obesity, class 3: Secondary | ICD-10-CM

## 2018-07-14 DIAGNOSIS — R32 Unspecified urinary incontinence: Secondary | ICD-10-CM | POA: Insufficient documentation

## 2018-07-14 DIAGNOSIS — Z8249 Family history of ischemic heart disease and other diseases of the circulatory system: Secondary | ICD-10-CM | POA: Insufficient documentation

## 2018-07-14 DIAGNOSIS — E1165 Type 2 diabetes mellitus with hyperglycemia: Secondary | ICD-10-CM | POA: Insufficient documentation

## 2018-07-14 DIAGNOSIS — Z7982 Long term (current) use of aspirin: Secondary | ICD-10-CM | POA: Insufficient documentation

## 2018-07-14 DIAGNOSIS — E890 Postprocedural hypothyroidism: Secondary | ICD-10-CM

## 2018-07-14 DIAGNOSIS — Z880 Allergy status to penicillin: Secondary | ICD-10-CM | POA: Insufficient documentation

## 2018-07-14 DIAGNOSIS — Z760 Encounter for issue of repeat prescription: Secondary | ICD-10-CM | POA: Diagnosis not present

## 2018-07-14 DIAGNOSIS — E785 Hyperlipidemia, unspecified: Secondary | ICD-10-CM | POA: Diagnosis not present

## 2018-07-14 LAB — GLUCOSE, POCT (MANUAL RESULT ENTRY): POC Glucose: 351 mg/dl — AB (ref 70–99)

## 2018-07-14 MED ORDER — LEVOTHYROXINE SODIUM 100 MCG PO TABS: 100.0000 ug | ORAL_TABLET | Freq: Every day | ORAL | 6 refills | Status: DC

## 2018-07-14 MED FILL — TORSEMIDE 20 MG TABLET: 20 | 30 days supply | Qty: 30 | Fill #2

## 2018-07-14 MED FILL — LISINOPRIL 40 MG TABLET: 40 | 30 days supply | Qty: 30 | Fill #4

## 2018-07-14 MED FILL — SYNTHROID 100 MCG TABLET: 100 | 30 days supply | Qty: 30 | Fill #0

## 2018-07-14 MED FILL — CARVEDILOL 12.5 MG TABLET: 12.5 | 30 days supply | Qty: 75 | Fill #7

## 2018-07-14 MED FILL — NOVOLOG FLEXPEN SYRINGE: 100 | 30 days supply | Qty: 60 | Fill #0

## 2018-07-14 MED FILL — DEXILANT DR 60 MG CAPSULE: 60 | 30 days supply | Qty: 30 | Fill #1 | Status: TO

## 2018-07-14 MED FILL — JANUVIA 50 MG TABLET: 50 | 30 days supply | Qty: 30 | Fill #2

## 2018-07-14 MED FILL — GABAPENTIN 300 MG CAPSULE: 300 | 30 days supply | Qty: 120 | Fill #0

## 2018-07-14 MED FILL — ATORVASTATIN 40 MG TABLET: 40 | 30 days supply | Qty: 30 | Fill #2

## 2018-07-14 NOTE — Progress Notes (Signed)
[name], female    DOB: Jan 14, 1951  MRN: [id number]  CC: re-establish; Diabetes; Hypertension; and Medication Refill   Subjective: Anita James is a 67 y.o. female who presents for chronic disease management.  I had seen her previously but she had requested a change of PCP to Dr. Chapman Fitch.  Now she has requested a change back. Her concerns today include:  Patient with history of diabetes type 2 with peripheral neuropathy, hypertension, hypothyroidism, obstructive sleep apnea on CPAP, morbid obesity, diastolic CHF with EF of 29-47%.Urinary incontinence.  DM: Since I had last seen her, she was evaluated by endocrinology Dr. Loanne Drilling.  Lantus was changed to 65 units daily and NovoLog to 50 units 3 times a day.  She reports compliance with medications.  She is also on Januvia and Victoza. Blood sugar today in the 300s.  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  Overall she reports that her eating habits are good some days and other days not so good. Exercise: Not getting in much exercise activity.  She would like to do some physical therapy because of deconditioning.  In particular she is requesting therapy for water aerobics.  November 1 she will have Medicare in hopes that her plan would allow for a Silver sneakers program. She was advised by endocrinology to consider weight reduction surgery.  She would like to consider this.  She has not gone to the recommended classes at [facility]. Eye exam: Last eye exam was 02/2018 by Dr. Schuyler Amor.  She had cataract surgery at that time. Reports some tingling in the arms at times.  Thyroid: Reports compliance with levothyroxine.  HTN: Reports compliance with medications and salt restriction.  She uses garlic powder and misses-as salt substitutes.  Endorses shortness of breath which she attributes to deconditioning.  Reportedly saw nephrologist  Dr. Candiss Norse in [place].  She is concerned that the diuretics are hurting her kidneys.  She had blood test done 4 days ago through his office and has an appointment with him next week again.   Patient Active Problem List   Diagnosis Date Noted  . OSA on CPAP 05/19/2017  . Morbid obesity due to excess calories ([place]) 03/17/2017  . Neuropathic pain 11/04/2016  . Carpal tunnel syndrome 06/14/2014  . 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 Cervical radiculopathy at C8 06/05/2014  . Hyperlipemia 09/12/2013  . Diastolic CHF ([place]) 65/46/5035  . Major depression 09/12/2013  . Allergic rhinitis 06/22/2013  . Chronic pain syndrome 05/31/2013  . Gastroesophageal reflux disease without esophagitis 08/30/2012  . Essential hypertension   . Hypothyroidism   . Type 2 diabetes, uncontrolled, with neuropathy ([place])      Current Outpatient Medications on File Prior to Visit  Medication Sig Dispense Refill  . ACCU-CHEK SOFTCLIX LANCETS lancets USE AS DIRECTED TO TEST TWICE DAILY 100 each 12  . aspirin EC 81 MG tablet Take 81 mg by mouth daily.    Marland Kitchen atorvastatin (LIPITOR) 40 MG tablet TAKE 1 TABLET BY MOUTH DAILY AT 6 PM. 30 tablet 2  . Blood Glucose Monitoring Suppl (ACCU-CHEK AVIVA PLUS) w/Device KIT USE AS DIRECTED TO TEST TWICE DAILY 1 kit 0  . carvedilol (COREG) 12.5 MG tablet Take a tablet and a half, 18.75 mg, in the morning and one tablet, 12.5 mg, at night. 225 tablet 3  . Cetirizine HCl 10 MG  CAPS Take 1 capsule (10 mg total) by mouth daily. 30 capsule 0  . DEXILANT 60 MG capsule TAKE 1 CAPSULE BY MOUTH ONCE DAILY 90 capsule 0  . gabapentin (NEURONTIN) 300 MG capsule TAKE 2 CAPSULES BY MOUTH 2 TIMES DAILY. 120 capsule 2  . glucose blood (ACCU-CHEK AVIVA PLUS) test strip USE AS DIRECTED TO TEST TWICE DAILY 100 each 12  . insulin aspart (NOVOLOG FLEXPEN) 100 UNIT/ML FlexPen Inject 50 Units into the skin 3 (three) times daily with meals. And pen needles 4/day 20 pen 3  . Insulin Glargine (LANTUS  SOLOSTAR) 100 UNIT/ML Solostar Pen Inject 65 Units into the skin at bedtime. 10 pen 11  . liraglutide (VICTOZA) 18 MG/3ML SOPN 1.8 mg daily 9 mL 3  . lisinopril (PRINIVIL,ZESTRIL) 40 MG tablet TAKE 1 TABLET BY MOUTH DAILY. 90 tablet 3  . mometasone (NASONEX) 50 MCG/ACT nasal spray Place 2 sprays into the nose daily. 17 g 3  . Multiple Vitamins-Minerals (EYE VITAMINS) CAPS Take 1 capsule by mouth daily.    . NYSTATIN powder APPLY TOPICALLY 4 TIMES DAILY. 15 g 0  . sitaGLIPtin (JANUVIA) 50 MG tablet Take 1 tablet (50 mg total) by mouth daily. 30 tablet 2  . spironolactone (ALDACTONE) 25 MG tablet Take 25 mg, one tablet, in the morning and then 12.5 mg, half a tablet, in the afternoon. 135 tablet 3  . torsemide (DEMADEX) 20 MG tablet Take 1 tablet by mouth daily as needed.  11  . [DISCONTINUED] fenofibrate (TRICOR) 145 MG tablet Take 1 tablet (145 mg total) by mouth daily. 30 tablet 2  . [DISCONTINUED] insulin detemir (LEVEMIR) 100 UNIT/ML injection Inject 60 Units into the skin at bedtime. 10 mL 1  . [DISCONTINUED] metoprolol (LOPRESSOR) 50 MG tablet Take 1 tablet (50 mg total) by mouth 2 (two) times daily. 60 tablet 2   No current facility-administered medications on file prior to visit.     Allergies  Allergen Reactions  . Amoxicillin-Pot Clavulanate Nausea And Vomiting and Other (See Comments)    Combination of medications tear lining of stomach  . Hydrocodone-Acetaminophen Shortness Of Breath  . Oxycodone Shortness Of Breath    oxycontin too  . Sulfa Antibiotics Shortness Of Breath  . Azithromycin   . Norvasc [Amlodipine Besylate] Swelling    feet  . Sulfamethoxazole Nausea Only  . Tramadol Other (See Comments)    Unknown, toxicity. Patient advised she had to be given Narcan with this    Social History   Socioeconomic History  . Marital status: Divorced    Spouse name: Not on file  . Number of children: 6  . Years of education: BA  . Highest education level: Not on file    Occupational History  . Occupation: unemployed  Social Needs  . Financial resource strain: Not on file  . Food insecurity:    Worry: Not on file    Inability: Not on file  . Transportation needs:    Medical: Not on file    Non-medical: Not on file  Tobacco Use  . Smoking status: Never Smoker  . Smokeless tobacco: Never Used  Substance and Sexual Activity  . Alcohol use: No  . Drug use: No  . Sexual activity: Never  Lifestyle  . Physical activity:    Days per week: Not on file    Minutes per session: Not on file  . Stress: Not on file  Relationships  . Social connections:    Talks on phone: Not on file  Gets together: Not on file    Attends religious service: Not on file    Active member of club or organization: Not on file    Attends meetings of clubs or organizations: Not on file    Relationship status: Not on file  . Intimate partner violence:    Fear of current or ex partner: Not on file    Emotionally abused: Not on file    Physically abused: Not on file    Forced sexual activity: Not on file  Other Topics Concern  . Not on file  Social History Narrative  . Not on file    Family History  Problem Relation Age of Onset  . Cancer Mother        cervical- mets  . Cancer Father   . Heart disease Father   . Diabetes Father   . Diabetes Sister   . Heart disease Sister   . Heart disease Sister   . COPD Sister     Past Surgical History:  Procedure Laterality Date  . CESAREAN SECTION    . KNEE SURGERY Left   . THYROID SURGERY     goiter    ROS: Review of Systems Negative except as above PHYSICAL EXAM: BP 119/89   Pulse 94   Temp 98.2 F (36.8 C) (Oral)   Resp 16   Wt (!) 303 lb 9.6 oz (137.7 kg)   SpO2 94%   BMI 49.00 kg/m   Wt Readings from Last 3 Encounters:  07/14/18 (!) 303 lb 9.6 oz (137.7 kg)  06/29/18 (!) 304 lb (137.9 kg)  06/14/18 (!) 306 lb 3.2 oz (138.9 kg)   Physical Exam  General appearance - alert, well appearing, obese  Caucasian female and in no distress Mental status - normal mood, behavior, speech, dress, motor activity, and thought processes Neck - supple, no significant adenopathy Chest - clear to auscultation, no wheezes, rales or rhonchi, symmetric air entry Heart - normal rate, regular rhythm, normal S1, S2, no murmurs, rubs, clicks or gallops Extremities - peripheral pulses normal, no pedal edema, no clubbing or cyanosis   Results for orders placed or performed in visit on 07/14/18  POCT glucose (manual entry)  Result Value Ref Range   POC Glucose 351 (A) 70 - 99 mg/dl  POCT glycosylated hemoglobin (Hb A1C)  Result Value Ref Range   Hemoglobin A1C     HbA1c POC (<> result, manual entry)     HbA1c, POC (prediabetic range)     HbA1c, POC (controlled diabetic range) 7.1 (A) 0.0 - 7.0 %   Lab Results  Component Value Date   HGBA1C 7.1 (A) 07/15/2018     ASSESSMENT AND PLAN: 1. Type 2 diabetes, uncontrolled, with neuropathy (HCC) A1c has improved.  Advised to continue current dose of her insulins, Victoza and Januvia. Dietary counseling given. Encouraged her to get in some form of regular exercise if only for 10 minutes a day.  She is requesting referral to physical therapy in particular pool therapy because of deconditioning.  I think this is reasonable.  Referral placed.  Encouraged her to attend informational classes regarding weight reduction surgery if she is interested in pursuing that. - POCT glucose (manual entry) - POCT glycosylated hemoglobin (Hb A1C)  2. Gastroesophageal reflux disease without esophagitis Refill given on Dexilant  3. Postoperative hypothyroidism - levothyroxine (SYNTHROID) 100 MCG tablet; Take 1 tablet (100 mcg total) by mouth daily before breakfast.  Dispense: 30 tablet; Refill: 6  4. Class 3 severe  obesity due to excess calories with serious comorbidity and body mass index (BMI) of 45.0 to 49.9 in adult [facility]) See #1 above  5. Physical deconditioning -  Ambulatory referral to Physical Therapy    Patient was given the opportunity to ask questions.  Patient verbalized understanding of the plan and was able to repeat key elements of the plan.   Orders Placed This Encounter  Procedures  . Ambulatory referral to Physical Therapy  . POCT glucose (manual entry)  . POCT glycosylated hemoglobin (Hb A1C)     Requested Prescriptions   Signed Prescriptions Disp Refills  . levothyroxine (SYNTHROID) 100 MCG tablet 30 tablet 6    Sig: Take 1 tablet (100 mcg total) by mouth daily before breakfast.    Return in about 3 months (around 10/14/2018).  Karle Plumber, MD, FACP

## 2018-07-15 LAB — POCT GLYCOSYLATED HEMOGLOBIN (HGB A1C): HbA1c, POC (controlled diabetic range): 7.1 % — AB (ref 0.0–7.0)

## 2018-07-15 MED FILL — CIPROFLOXACIN HCL 500 MG TA: 500 | 7 days supply | Qty: 7 | Fill #0

## 2018-07-20 ENCOUNTER — Ambulatory Visit: Payer: Medicaid Other | Admitting: Endocrinology

## 2018-07-20 DIAGNOSIS — Z0289 Encounter for other administrative examinations: Secondary | ICD-10-CM

## 2018-07-21 ENCOUNTER — Other Ambulatory Visit: Payer: Self-pay | Admitting: Nephrology

## 2018-07-21 DIAGNOSIS — R600 Localized edema: Secondary | ICD-10-CM

## 2018-08-02 ENCOUNTER — Ambulatory Visit (HOSPITAL_COMMUNITY)
Admission: RE | Admit: 2018-08-02 | Discharge: 2018-08-02 | Disposition: A | Discharge status: Home / Self Care | Payer: Medicare Other | Source: Ambulatory Visit | Attending: Nephrology | Admitting: Nephrology

## 2018-08-02 DIAGNOSIS — I509 Heart failure, unspecified: Secondary | ICD-10-CM | POA: Insufficient documentation

## 2018-08-02 DIAGNOSIS — R600 Localized edema: Secondary | ICD-10-CM

## 2018-08-02 DIAGNOSIS — E785 Hyperlipidemia, unspecified: Secondary | ICD-10-CM | POA: Diagnosis not present

## 2018-08-02 DIAGNOSIS — G4730 Sleep apnea, unspecified: Secondary | ICD-10-CM | POA: Insufficient documentation

## 2018-08-02 DIAGNOSIS — I110 Hypertensive heart disease with heart failure: Secondary | ICD-10-CM | POA: Diagnosis not present

## 2018-08-02 NOTE — Progress Notes (Signed)
  Echocardiogram 2D Echocardiogram has been performed.  Darlina Sicilian M 08/02/2018, 3:24 PM

## 2018-08-03 ENCOUNTER — Encounter: Payer: Self-pay | Admitting: Family Medicine

## 2018-08-03 ENCOUNTER — Ambulatory Visit: Payer: Medicare Other | Attending: Family Medicine | Admitting: Family Medicine

## 2018-08-03 VITALS — BP 125/74 | HR 81 | Temp 97.7°F | Ht 66.0 in | Wt 309.0 lb

## 2018-08-03 DIAGNOSIS — Z880 Allergy status to penicillin: Secondary | ICD-10-CM | POA: Diagnosis not present

## 2018-08-03 DIAGNOSIS — R05 Cough: Secondary | ICD-10-CM | POA: Diagnosis not present

## 2018-08-03 DIAGNOSIS — E039 Hypothyroidism, unspecified: Secondary | ICD-10-CM | POA: Insufficient documentation

## 2018-08-03 DIAGNOSIS — Z882 Allergy status to sulfonamides status: Secondary | ICD-10-CM | POA: Insufficient documentation

## 2018-08-03 DIAGNOSIS — E785 Hyperlipidemia, unspecified: Secondary | ICD-10-CM | POA: Insufficient documentation

## 2018-08-03 DIAGNOSIS — Z794 Long term (current) use of insulin: Secondary | ICD-10-CM | POA: Insufficient documentation

## 2018-08-03 DIAGNOSIS — J018 Other acute sinusitis: Secondary | ICD-10-CM | POA: Diagnosis not present

## 2018-08-03 DIAGNOSIS — I509 Heart failure, unspecified: Secondary | ICD-10-CM | POA: Diagnosis not present

## 2018-08-03 DIAGNOSIS — E1122 Type 2 diabetes mellitus with diabetic chronic kidney disease: Secondary | ICD-10-CM | POA: Insufficient documentation

## 2018-08-03 DIAGNOSIS — E1140 Type 2 diabetes mellitus with diabetic neuropathy, unspecified: Secondary | ICD-10-CM | POA: Insufficient documentation

## 2018-08-03 DIAGNOSIS — Z888 Allergy status to other drugs, medicaments and biological substances status: Secondary | ICD-10-CM | POA: Insufficient documentation

## 2018-08-03 DIAGNOSIS — F419 Anxiety disorder, unspecified: Secondary | ICD-10-CM | POA: Diagnosis not present

## 2018-08-03 DIAGNOSIS — E669 Obesity, unspecified: Secondary | ICD-10-CM | POA: Diagnosis not present

## 2018-08-03 DIAGNOSIS — R6889 Other general symptoms and signs: Secondary | ICD-10-CM

## 2018-08-03 DIAGNOSIS — Z885 Allergy status to narcotic agent status: Secondary | ICD-10-CM | POA: Insufficient documentation

## 2018-08-03 DIAGNOSIS — G5600 Carpal tunnel syndrome, unspecified upper limb: Secondary | ICD-10-CM | POA: Insufficient documentation

## 2018-08-03 DIAGNOSIS — I130 Hypertensive heart and chronic kidney disease with heart failure and stage 1 through stage 4 chronic kidney disease, or unspecified chronic kidney disease: Secondary | ICD-10-CM | POA: Insufficient documentation

## 2018-08-03 DIAGNOSIS — K449 Diaphragmatic hernia without obstruction or gangrene: Secondary | ICD-10-CM | POA: Diagnosis not present

## 2018-08-03 DIAGNOSIS — E559 Vitamin D deficiency, unspecified: Secondary | ICD-10-CM | POA: Diagnosis not present

## 2018-08-03 DIAGNOSIS — J019 Acute sinusitis, unspecified: Secondary | ICD-10-CM | POA: Diagnosis not present

## 2018-08-03 DIAGNOSIS — Z87440 Personal history of urinary (tract) infections: Secondary | ICD-10-CM | POA: Diagnosis not present

## 2018-08-03 DIAGNOSIS — Z881 Allergy status to other antibiotic agents status: Secondary | ICD-10-CM | POA: Diagnosis not present

## 2018-08-03 DIAGNOSIS — Z7982 Long term (current) use of aspirin: Secondary | ICD-10-CM | POA: Insufficient documentation

## 2018-08-03 DIAGNOSIS — N189 Chronic kidney disease, unspecified: Secondary | ICD-10-CM | POA: Diagnosis not present

## 2018-08-03 DIAGNOSIS — Z79899 Other long term (current) drug therapy: Secondary | ICD-10-CM | POA: Diagnosis not present

## 2018-08-03 DIAGNOSIS — J029 Acute pharyngitis, unspecified: Secondary | ICD-10-CM | POA: Insufficient documentation

## 2018-08-03 LAB — POCT URINALYSIS DIP (CLINITEK)
BILIRUBIN UA: NEGATIVE mg/dL
Bilirubin, UA: NEGATIVE
Glucose, UA: NEGATIVE mg/dL
Leukocytes, UA: NEGATIVE
Nitrite, UA: NEGATIVE
POC PROTEIN,UA: NEGATIVE
RBC UA: NEGATIVE
UROBILINOGEN UA: 0.2 U/dL
pH, UA: 5.5 (ref 5.0–8.0)

## 2018-08-03 MED ORDER — DOXYCYCLINE HYCLATE 100 MG PO TABS: 100.0000 mg | ORAL_TABLET | Freq: Two times a day (BID) | ORAL | 0 refills | Status: DC

## 2018-08-03 MED ORDER — HYDROCOD POLST-CPM POLST ER 10-8 MG/5ML PO SUER: 5.0000 mL | Freq: Two times a day (BID) | ORAL | 0 refills | Status: DC

## 2018-08-03 MED FILL — DOXYCYCLINE HYCLATE 100 MG: 100 | 10 days supply | Qty: 20 | Fill #0

## 2018-08-03 NOTE — Progress Notes (Signed)
Subjective:  [name], female    DOB: 1951/04/24  Age: 67 y.o. MRN: [id number]  CC: Cough and Sore Throat   HPI Anita James is a 67 year old female with history of type 2 diabetes mellitus (A1c 7.1), hypertension, CHF and hypothyroidism who presents today for an acute visit complaining of a 24-hour history of throat, sinus drainage, ear pain, nasal congestion associated with dyspnea and wheezing.  She endorses a history of sick contact in her granddaughter who went to see her physician today. She denies the presence of fever but endorses myalgias and arthralgias; states she has chronic arthralgias. She has not used any OTC medications and has no GI symptoms. She received a flu shot 2 months ago. She would like her urine to be checked for resolution of the UTI which she was treated for recently.  Past Medical History:  Diagnosis Date  . Anxiety   . Carpal tunnel syndrome 06/14/2014  . Chest pain   . CHF (congestive heart failure) ([place])   . Chronic back pain   . Chronic kidney disease 03/2013   failure due to meds  . Diabetes mellitus type II, uncontrolled ([place])   . Diverticulitis   . Frequent headaches   . Hiatal hernia   . Hyperlipemia   . Hypertension   . Hypothyroidism   . 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 Neuropathy   . Obesity   . Pneumonia   . Vitamin D deficiency 06/05/2014    Past Surgical History:  Procedure Laterality Date  . CESAREAN SECTION    . KNEE SURGERY Left   . THYROID SURGERY     goiter    Allergies  Allergen Reactions  . Amoxicillin-Pot Clavulanate Nausea And Vomiting and Other (See Comments)    Combination of medications tear lining of stomach  . Hydrocodone-Acetaminophen Shortness Of Breath  . Oxycodone Shortness Of Breath    oxycontin too  . Sulfa Antibiotics Shortness Of Breath  . Azithromycin   . Norvasc [Amlodipine Besylate] Swelling    feet  . Sulfamethoxazole Nausea Only  . Tramadol Other (See Comments)    Unknown,  toxicity. Patient advised she had to be given Narcan with this     Outpatient Medications Prior to Visit  Medication Sig Dispense Refill  . ACCU-CHEK SOFTCLIX LANCETS lancets USE AS DIRECTED TO TEST TWICE DAILY 100 each 12  . aspirin EC 81 MG tablet Take 81 mg by mouth daily.    Marland Kitchen atorvastatin (LIPITOR) 40 MG tablet TAKE 1 TABLET BY MOUTH DAILY AT 6 PM. 30 tablet 2  . Blood Glucose Monitoring Suppl (ACCU-CHEK AVIVA PLUS) w/Device KIT USE AS DIRECTED TO TEST TWICE DAILY 1 kit 0  . carvedilol (COREG) 12.5 MG tablet Take a tablet and a half, 18.75 mg, in the morning and one tablet, 12.5 mg, at night. 225 tablet 3  . Cetirizine HCl 10 MG CAPS Take 1 capsule (10 mg total) by mouth daily. 30 capsule 0  . DEXILANT 60 MG capsule TAKE 1 CAPSULE BY MOUTH ONCE DAILY 90 capsule 0  . gabapentin (NEURONTIN) 300 MG capsule TAKE 2 CAPSULES BY MOUTH 2 TIMES DAILY. 120 capsule 2  . glucose blood (ACCU-CHEK AVIVA PLUS) test strip USE AS DIRECTED TO TEST TWICE DAILY 100 each 12  . insulin aspart (NOVOLOG FLEXPEN) 100 UNIT/ML FlexPen Inject 50 Units into the skin 3 (three) times daily with meals. And pen needles 4/day 20 pen 3  . Insulin Glargine (LANTUS SOLOSTAR) 100 UNIT/ML Solostar Pen  Inject 65 Units into the skin at bedtime. 10 pen 11  . levothyroxine (SYNTHROID) 100 MCG tablet Take 1 tablet (100 mcg total) by mouth daily before breakfast. 30 tablet 6  . liraglutide (VICTOZA) 18 MG/3ML SOPN 1.8 mg daily 9 mL 3  . lisinopril (PRINIVIL,ZESTRIL) 40 MG tablet TAKE 1 TABLET BY MOUTH DAILY. 90 tablet 3  . mometasone (NASONEX) 50 MCG/ACT nasal spray Place 2 sprays into the nose daily. 17 g 3  . Multiple Vitamins-Minerals (EYE VITAMINS) CAPS Take 1 capsule by mouth daily.    . NYSTATIN powder APPLY TOPICALLY 4 TIMES DAILY. 15 g 0  . sitaGLIPtin (JANUVIA) 50 MG tablet Take 1 tablet (50 mg total) by mouth daily. 30 tablet 2  . spironolactone (ALDACTONE) 25 MG tablet Take 25 mg, one tablet, in the morning and then 12.5  mg, half a tablet, in the afternoon. 135 tablet 3  . torsemide (DEMADEX) 20 MG tablet Take 1 tablet by mouth daily as needed.  11   No facility-administered medications prior to visit.     ROS Review of Systems  Constitutional: Negative for activity change, appetite change, fatigue and fever.  HENT: Positive for congestion, ear pain, postnasal drip, sinus pressure and sore throat.   Eyes: Negative for visual disturbance.  Respiratory: Positive for shortness of breath. Negative for cough, chest tightness and wheezing.   Cardiovascular: Negative for chest pain and palpitations.  Gastrointestinal: Negative for abdominal distention, abdominal pain and constipation.  Endocrine: Negative for polydipsia.  Genitourinary: Negative for dysuria and frequency.  Musculoskeletal: Positive for arthralgias and myalgias. Negative for back pain.  Skin: Negative for rash.  Neurological: Negative for tremors, light-headedness and numbness.  Hematological: Does not bruise/bleed easily.  Psychiatric/Behavioral: Negative for agitation and behavioral problems.    Objective:  BP 125/74   Pulse 81   Temp 97.7 F (36.5 C) (Oral)   Ht '5\' 6"'  (1.676 m)   Wt (!) 309 lb (140.2 kg)   SpO2 96%   BMI 49.87 kg/m   BP/Weight 08/03/2018 07/14/2018 31/12/9700  Systolic BP 637 858 -  Diastolic BP 74 89 -  Wt. (Lbs) 309 303.6 304  BMI 49.87 49 49.07      Physical Exam  Constitutional: She is oriented to person, place, and time. She appears well-developed.  Ill looking  HENT:  Right Ear: External ear normal.  Left Ear: External ear normal.  Mild oropharyngeal erythema Frontal, maxillary, ethmoidal sinus tenderness to palpation  Cardiovascular: Normal rate, normal heart sounds and intact distal pulses.  No murmur heard. Pulmonary/Chest: Effort normal and breath sounds normal. She has no wheezes. She has no rales. She exhibits no tenderness.  Abdominal: Soft. Bowel sounds are normal. She exhibits no  distension and no mass. There is no tenderness.  Musculoskeletal: Normal range of motion.  Lymphadenopathy:    She has cervical adenopathy (Left).  Neurological: She is alert and oriented to person, place, and time.  Psychiatric: She has a normal mood and affect.     Assessment & Plan:   1. Acute non-recurrent sinusitis of other sinus Rest, increase fluid intake - doxycycline (VIBRA-TABS) 100 MG tablet; Take 1 tablet (100 mg total) by mouth 2 (two) times daily.  Dispense: 20 tablet; Refill: 0  2. Flu-like symptoms - Respiratory virus panel - Influenza A and B Ag, Immunoassay  3.  History of UTI/ Recently treated UTI UA negative for UTI Meds ordered this encounter  Medications  . doxycycline (VIBRA-TABS) 100 MG tablet  Sig: Take 1 tablet (100 mg total) by mouth 2 (two) times daily.    Dispense:  20 tablet    Refill:  0  . chlorpheniramine-HYDROcodone (TUSSIONEX PENNKINETIC ER) 10-8 MG/5ML SUER    Sig: Take 5 mLs by mouth 2 (two) times daily.    Dispense:  140 mL    Refill:  0    Follow-up: Return for Up of chronic medical conditions, keep previously scheduled appointment.   Charlott Rakes MD

## 2018-08-04 LAB — INFLUENZA A AND B
INFLUENZA A AG, EIA: NEGATIVE
INFLUENZA B AG, EIA: NEGATIVE

## 2018-08-04 LAB — PLEASE NOTE:

## 2018-08-05 ENCOUNTER — Telehealth: Payer: Self-pay

## 2018-08-05 NOTE — Telephone Encounter (Signed)
Patient was called and informed of lab results. 

## 2018-08-05 NOTE — Telephone Encounter (Signed)
-----   Message from Charlott Rakes, MD sent at 08/05/2018 11:08 AM EST ----- Flu tests are negative

## 2018-08-10 LAB — RESPIRATORY VIRUS PANEL
ADENOVIRUS: NEGATIVE
INFLUENZA A: NEGATIVE
Influenza B: NEGATIVE
Metapneumovirus: NEGATIVE
PARAINFLUENZA 3 A: NEGATIVE
Parainfluenza 1: NEGATIVE
Parainfluenza 2: NEGATIVE
Respiratory Syncytial Virus A: NEGATIVE
Respiratory Syncytial Virus B: NEGATIVE
Rhinovirus: NEGATIVE

## 2018-08-11 ENCOUNTER — Other Ambulatory Visit: Payer: Self-pay | Admitting: Family Medicine

## 2018-08-11 DIAGNOSIS — E1140 Type 2 diabetes mellitus with diabetic neuropathy, unspecified: Secondary | ICD-10-CM

## 2018-08-11 DIAGNOSIS — E1165 Type 2 diabetes mellitus with hyperglycemia: Secondary | ICD-10-CM

## 2018-08-11 DIAGNOSIS — IMO0002 Reserved for concepts with insufficient information to code with codable children: Secondary | ICD-10-CM

## 2018-08-11 DIAGNOSIS — E785 Hyperlipidemia, unspecified: Secondary | ICD-10-CM

## 2018-08-11 MED FILL — LISINOPRIL 40 MG TABLET: 40 | 30 days supply | Qty: 30 | Fill #5

## 2018-08-11 MED FILL — SYNTHROID 100 MCG TABLET: 100 | 30 days supply | Qty: 30 | Fill #1

## 2018-08-11 MED FILL — GABAPENTIN 300 MG CAPSULE: 300 | 30 days supply | Qty: 120 | Fill #1

## 2018-08-11 MED FILL — CARVEDILOL 12.5 MG TABLET: 12.5 | 30 days supply | Qty: 75 | Fill #8

## 2018-08-12 MED FILL — ATORVASTATIN CALCIUM 40 MG: 40 | 30 days supply | Qty: 30 | Fill #0

## 2018-08-12 MED FILL — JANUVIA 50 MG TABLET: 50 | 30 days supply | Qty: 30 | Fill #0

## 2018-08-15 ENCOUNTER — Other Ambulatory Visit: Payer: Self-pay | Admitting: Endocrinology

## 2018-08-15 ENCOUNTER — Other Ambulatory Visit: Payer: Self-pay | Admitting: Pharmacist

## 2018-08-15 ENCOUNTER — Ambulatory Visit: Payer: Medicare Other | Attending: Internal Medicine | Admitting: *Deleted

## 2018-08-15 DIAGNOSIS — IMO0002 Reserved for concepts with insufficient information to code with codable children: Secondary | ICD-10-CM

## 2018-08-15 DIAGNOSIS — E1165 Type 2 diabetes mellitus with hyperglycemia: Principal | ICD-10-CM

## 2018-08-15 DIAGNOSIS — Z111 Encounter for screening for respiratory tuberculosis: Secondary | ICD-10-CM

## 2018-08-15 DIAGNOSIS — E1140 Type 2 diabetes mellitus with diabetic neuropathy, unspecified: Secondary | ICD-10-CM

## 2018-08-15 MED ORDER — INSULIN ASPART 100 UNIT/ML FLEXPEN: 50.0000 [IU] | PEN_INJECTOR | Freq: Three times a day (TID) | SUBCUTANEOUS | 3 refills | Status: DC

## 2018-08-15 MED FILL — DEXILANT DR 60 MG CAPSULE: 60 | 30 days supply | Qty: 30 | Fill #2 | Status: TO

## 2018-08-15 MED FILL — LANTUS SOLOSTAR 100 UNITS/M: 100 | 22 days supply | Qty: 15 | Fill #3

## 2018-08-15 NOTE — Progress Notes (Signed)
PPD Placement note Anita James, 67 y.o. female is here today for placement of PPD test Reason for PPD test: employment Pt taken PPD test before: yes Verified in allergy area and with patient that they are not allergic to the products PPD is made of (Phenol or Tween). No:  Is patient taking any oral or IV steroid medication now or have they taken it in the last month?no Has the patient ever received the BCG vaccine?: no Has the patient been in recent contact with anyone known or suspected of having active TB disease?:no       PPD placed on 08/15/2018.  Patient advised to return for reading on Wednesday. Due to holiday, [facility] will be closed on Thursday. Pt verbalized understanding.

## 2018-08-17 ENCOUNTER — Telehealth: Payer: Self-pay | Admitting: Internal Medicine

## 2018-08-17 LAB — TB SKIN TEST: TB Skin Test: NEGATIVE

## 2018-08-17 MED ORDER — CETIRIZINE HCL 10 MG PO CAPS: 1.0000 | ORAL_CAPSULE | Freq: Every day | ORAL | 0 refills | Status: DC

## 2018-08-17 MED ORDER — AZITHROMYCIN 250 MG PO TABS: ORAL_TABLET | ORAL | 0 refills | Status: DC

## 2018-08-17 MED ORDER — FLUTICASONE PROPIONATE 50 MCG/ACT NA SUSP: 2.0000 | Freq: Every day | NASAL | 1 refills | Status: DC

## 2018-08-17 MED ORDER — FLUCONAZOLE 150 MG PO TABS: 150.0000 mg | ORAL_TABLET | Freq: Once | ORAL | 0 refills | Status: AC

## 2018-08-17 MED FILL — NOVOLOG FLEXPEN SYRINGE: 100 | 30 days supply | Qty: 60 | Fill #0

## 2018-08-17 MED FILL — TORSEMIDE 20 MG TABLET: 20 | 30 days supply | Qty: 30 | Fill #3

## 2018-08-17 NOTE — Telephone Encounter (Signed)
Pleases call the patient back she wants to speak with you about the anabiotic she was given.

## 2018-08-17 NOTE — Telephone Encounter (Signed)
Returned pt call and spoke with pt regarding antibiotics.  Dr. Margarita Rana pt seen you for an acute visit on 08/03/2018 and you gave her doxycycline (VIBRA-TABS) 100 MG tablet. Pt states the antibiotics didn't work and she is wondering would you be able to give her something different or would she need another visit.   Pt is requesting that rx be sent to [facility]

## 2018-08-17 NOTE — Telephone Encounter (Signed)
Work with the patient on the phone who states she is not allergic to azithromycin.  I will send of a prescription for azithromycin to her pharmacy as well as Diflucan pill which she is requesting.

## 2018-08-22 MED FILL — FLUTICASONE PROP 50 MCG SPR: 50 | 30 days supply | Qty: 16 | Fill #0

## 2018-09-07 MED FILL — SYNTHROID 100 MCG TABLET: 100 | 30 days supply | Qty: 30 | Fill #2

## 2018-09-07 MED FILL — ATORVASTATIN CALCIUM 40 MG: 40 | 30 days supply | Qty: 30 | Fill #1

## 2018-09-07 MED FILL — LANTUS SOLOSTAR 100 UNITS/M: 100 | 22 days supply | Qty: 15 | Fill #4

## 2018-09-07 MED FILL — GABAPENTIN 300 MG CAPSULE: 300 | 30 days supply | Qty: 120 | Fill #2

## 2018-09-07 MED FILL — JANUVIA 50 MG TABLET: 50 | 30 days supply | Qty: 30 | Fill #1

## 2018-09-12 ENCOUNTER — Other Ambulatory Visit: Payer: Self-pay | Admitting: Family Medicine

## 2018-09-12 DIAGNOSIS — K219 Gastro-esophageal reflux disease without esophagitis: Secondary | ICD-10-CM

## 2018-09-12 MED FILL — NOVOLOG FLEXPEN SYRINGE: 100 | 30 days supply | Qty: 60 | Fill #1

## 2018-09-12 MED FILL — TORSEMIDE 20 MG TABLET: 20 | 30 days supply | Qty: 30 | Fill #4

## 2018-09-12 MED FILL — LISINOPRIL 40 MG TABLET: 40 | 30 days supply | Qty: 30 | Fill #6

## 2018-09-12 MED FILL — DEXILANT DR 60 MG CAPSULE: 60 | 90 days supply | Qty: 90 | Fill #0

## 2018-09-12 MED FILL — CARVEDILOL 12.5 MG TABLET: 12.5 | 30 days supply | Qty: 75 | Fill #9

## 2018-09-15 ENCOUNTER — Emergency Department (HOSPITAL_COMMUNITY): Payer: Medicare Other

## 2018-09-15 ENCOUNTER — Emergency Department (HOSPITAL_COMMUNITY)
Admission: EM | Admit: 2018-09-15 | Discharge: 2018-09-16 | Disposition: A | Discharge status: Home / Self Care | Payer: Medicare Other | Attending: Emergency Medicine | Admitting: Emergency Medicine

## 2018-09-15 ENCOUNTER — Encounter (HOSPITAL_COMMUNITY): Payer: Self-pay | Admitting: Emergency Medicine

## 2018-09-15 DIAGNOSIS — E039 Hypothyroidism, unspecified: Secondary | ICD-10-CM | POA: Diagnosis not present

## 2018-09-15 DIAGNOSIS — E119 Type 2 diabetes mellitus without complications: Secondary | ICD-10-CM | POA: Insufficient documentation

## 2018-09-15 DIAGNOSIS — Z794 Long term (current) use of insulin: Secondary | ICD-10-CM | POA: Diagnosis not present

## 2018-09-15 DIAGNOSIS — Z79899 Other long term (current) drug therapy: Secondary | ICD-10-CM | POA: Insufficient documentation

## 2018-09-15 DIAGNOSIS — Z7982 Long term (current) use of aspirin: Secondary | ICD-10-CM | POA: Diagnosis not present

## 2018-09-15 DIAGNOSIS — I5032 Chronic diastolic (congestive) heart failure: Secondary | ICD-10-CM | POA: Insufficient documentation

## 2018-09-15 DIAGNOSIS — I110 Hypertensive heart disease with heart failure: Secondary | ICD-10-CM | POA: Diagnosis not present

## 2018-09-15 DIAGNOSIS — R1013 Epigastric pain: Secondary | ICD-10-CM | POA: Diagnosis present

## 2018-09-15 DIAGNOSIS — R600 Localized edema: Secondary | ICD-10-CM | POA: Diagnosis not present

## 2018-09-15 LAB — CBC
HCT: 35.6 % — ABNORMAL LOW (ref 36.0–46.0)
Hemoglobin: 11.4 g/dL — ABNORMAL LOW (ref 12.0–15.0)
MCH: 29.9 pg (ref 26.0–34.0)
MCHC: 32 g/dL (ref 30.0–36.0)
MCV: 93.4 fL (ref 80.0–100.0)
Platelets: 191 10*3/uL (ref 150–400)
RBC: 3.81 MIL/uL — AB (ref 3.87–5.11)
RDW: 12.8 % (ref 11.5–15.5)
WBC: 6.5 10*3/uL (ref 4.0–10.5)
nRBC: 0 % (ref 0.0–0.2)

## 2018-09-15 LAB — COMPREHENSIVE METABOLIC PANEL
ALT: 17 U/L (ref 0–44)
AST: 27 U/L (ref 15–41)
Albumin: 3.5 g/dL (ref 3.5–5.0)
Alkaline Phosphatase: 55 U/L (ref 38–126)
Anion gap: 9 (ref 5–15)
BUN: 24 mg/dL — ABNORMAL HIGH (ref 8–23)
CO2: 22 mmol/L (ref 22–32)
CREATININE: 1.16 mg/dL — AB (ref 0.44–1.00)
Calcium: 8.6 mg/dL — ABNORMAL LOW (ref 8.9–10.3)
Chloride: 107 mmol/L (ref 98–111)
GFR calc Af Amer: 57 mL/min — ABNORMAL LOW (ref >60)
GFR, EST NON AFRICAN AMERICAN: 49 mL/min — AB (ref >60)
Glucose, Bld: 255 mg/dL — ABNORMAL HIGH (ref 70–99)
Potassium: 4.7 mmol/L (ref 3.5–5.1)
Sodium: 138 mmol/L (ref 135–145)
Total Bilirubin: 0.8 mg/dL (ref 0.3–1.2)
Total Protein: 6.2 g/dL — ABNORMAL LOW (ref 6.5–8.1)

## 2018-09-15 LAB — URINALYSIS, ROUTINE W REFLEX MICROSCOPIC
Bilirubin Urine: NEGATIVE
Glucose, UA: 50 mg/dL — AB
Hgb urine dipstick: NEGATIVE
Ketones, ur: NEGATIVE mg/dL
Nitrite: NEGATIVE
Protein, ur: NEGATIVE mg/dL
SPECIFIC GRAVITY, URINE: 1.02 (ref 1.005–1.030)
WBC, UA: >50 WBC/hpf — ABNORMAL HIGH (ref 0–5)
pH: 5 (ref 5.0–8.0)

## 2018-09-15 LAB — I-STAT TROPONIN, ED: TROPONIN I, POC: 0 ng/mL (ref 0.00–0.08)

## 2018-09-15 LAB — TROPONIN I: Troponin I: <0.03 ng/mL (ref <0.03)

## 2018-09-15 LAB — LIPASE, BLOOD: Lipase: 79 U/L — ABNORMAL HIGH (ref 11–51)

## 2018-09-15 MED ORDER — ONDANSETRON HCL 4 MG/2ML IJ SOLN
4.0000 mg | Freq: Once | INTRAMUSCULAR | Status: AC
  Administered 2018-09-15: 4 mg via INTRAVENOUS
  Filled 2018-09-15: qty 2

## 2018-09-15 MED ORDER — IOPAMIDOL (ISOVUE-300) INJECTION 61%
100.0000 mL | Freq: Once | INTRAVENOUS | Status: AC | PRN
  Administered 2018-09-15: 100 mL via INTRAVENOUS

## 2018-09-15 MED ORDER — NITROFURANTOIN MONOHYD MACRO 100 MG PO CAPS
100.0000 mg | ORAL_CAPSULE | Freq: Once | ORAL | Status: AC
  Administered 2018-09-15: 100 mg via ORAL
  Filled 2018-09-15: qty 1

## 2018-09-15 MED ORDER — LIDOCAINE VISCOUS HCL 2 % MT SOLN
15.0000 mL | Freq: Once | OROMUCOSAL | Status: AC
  Administered 2018-09-15: 15 mL via ORAL
  Filled 2018-09-15: qty 15

## 2018-09-15 MED ORDER — SODIUM CHLORIDE 0.9 % IV SOLN
INTRAVENOUS | Status: DC | PRN
  Administered 2018-09-15: 500 mL via INTRAVENOUS

## 2018-09-15 MED ORDER — SODIUM CHLORIDE (PF) 0.9 % IJ SOLN
INTRAMUSCULAR | Status: AC
  Filled 2018-09-15: qty 50

## 2018-09-15 MED ORDER — FAMOTIDINE IN NACL 20-0.9 MG/50ML-% IV SOLN
20.0000 mg | Freq: Once | INTRAVENOUS | Status: AC
  Administered 2018-09-15: 20 mg via INTRAVENOUS
  Filled 2018-09-15: qty 50

## 2018-09-15 MED ORDER — FENTANYL CITRATE (PF) 100 MCG/2ML IJ SOLN
50.0000 ug | Freq: Once | INTRAMUSCULAR | Status: AC
  Administered 2018-09-15: 50 ug via INTRAVENOUS
  Filled 2018-09-15: qty 2

## 2018-09-15 MED ORDER — SODIUM CHLORIDE 0.9 % IV BOLUS
1000.0000 mL | Freq: Once | INTRAVENOUS | Status: AC
  Administered 2018-09-15: 1000 mL via INTRAVENOUS

## 2018-09-15 MED ORDER — ALUM & MAG HYDROXIDE-SIMETH 200-200-20 MG/5ML PO SUSP
30.0000 mL | Freq: Once | ORAL | Status: AC
  Administered 2018-09-15: 30 mL via ORAL
  Filled 2018-09-15: qty 30

## 2018-09-15 MED ORDER — IOPAMIDOL (ISOVUE-300) INJECTION 61%
INTRAVENOUS | Status: AC
  Filled 2018-09-15: qty 100

## 2018-09-15 MED ORDER — FENTANYL CITRATE (PF) 100 MCG/2ML IJ SOLN
100.0000 ug | Freq: Once | INTRAMUSCULAR | Status: AC
  Administered 2018-09-15: 100 ug via INTRAVENOUS
  Filled 2018-09-15: qty 2

## 2018-09-15 NOTE — ED Provider Notes (Signed)
[place] [facility] DEPT Provider Note   CSN: [id number] Arrival date & time: 09/15/18  1524   History   Chief Complaint Chief Complaint  Patient presents with  . Abdominal Pain  . Nausea    HPI Shanty Ginty is a 67 y.o. female with a PMH of CHF, Diabetes, CKD, HTN, and HLD presents with constant epigastric abdominal pain onset 2 days ago. Patient describes pain as a burning and states it radiates to her left arm. Patient states has tried tylenol for her pain without relief. Patient states nothing makes her pain better or worse. Patient last ate at 11:30am today. Patient denies alcohol, drug, or tobacco use. Patient states she [facility] and Dr. Claiborne Billings. Patient reports associated nausea, but denies vomiting or diarrhea. Patient reports chronic bilateral leg edema, but states it is at baseline. Patient reports chronic shortness of breath, but states it is at baseline. Denies DOE, chest tightness or pressure, radiation to left arm, jaw or back, or diaphoresis.   HPI  Past Medical History:  Diagnosis Date  . Anxiety   . Carpal tunnel syndrome 06/14/2014  . Chest pain   . CHF (congestive heart failure) ([place])   . Chronic back pain   . Chronic kidney disease 03/2013   failure due to meds  . Diabetes mellitus type II, uncontrolled ([place])   . Diverticulitis   . Frequent headaches   . Hiatal hernia   . Hyperlipemia   . Hypertension   . Hypothyroidism   . 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 Neuropathy   . Obesity   . Pneumonia   . Vitamin D deficiency 06/05/2014    Patient Active Problem List   Diagnosis Date Noted  . OSA on CPAP 05/19/2017  . Morbid obesity due to excess calories ([place]) 03/17/2017  . Neuropathic pain 11/04/2016  . Carpal tunnel syndrome 06/14/2014  . 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 Cervical radiculopathy at C8 06/05/2014  . Hyperlipemia 09/12/2013  . Diastolic CHF ([place]) 69/48/5462  . Major depression  09/12/2013  . Allergic rhinitis 06/22/2013  . Chronic pain syndrome 05/31/2013  . Gastroesophageal reflux disease without esophagitis 08/30/2012  . Essential hypertension   . Hypothyroidism   . Type 2 diabetes, uncontrolled, with neuropathy [facility])     Past Surgical History:  Procedure Laterality Date  . CESAREAN SECTION    . KNEE SURGERY Left   . THYROID SURGERY     goiter     OB History    Gravida  4   Para  3   Term  2   Preterm  1   AB  1   Living  6     SAB      TAB      Ectopic  1   Multiple  2   Live Births  5            Home Medications    Prior to Admission medications   Medication Sig Start Date End Date Taking? Authorizing Provider  aspirin EC 81 MG tablet Take 81 mg by mouth daily. 11/20/16  Yes [provider]  atorvastatin (LIPITOR) 40 MG tablet TAKE 1 TABLET BY MOUTH DAILY AT 6 PM. Patient taking differently: Take 40 mg by mouth daily. TAKE 1 TABLET BY MOUTH DAILY AT 6 PM. 08/12/18  Yes Fulp, Cammie, MD  carvedilol (COREG) 12.5 MG tablet Take a tablet and a half, 18.75 mg, in the morning and one tablet,  12.5 mg, at night. 11/05/17  Yes Troy Sine, MD  Cetirizine HCl 10 MG CAPS Take 1 capsule (10 mg total) by mouth daily. 08/17/18  Yes Charlott Rakes, MD  DEXILANT 60 MG capsule TAKE 1 CAPSULE BY MOUTH ONCE DAILY 09/12/18  Yes Ladell Pier, MD  gabapentin (NEURONTIN) 300 MG capsule TAKE 2 CAPSULES BY MOUTH 2 TIMES DAILY. Patient taking differently: Take 600 mg by mouth 2 (two) times daily.  06/15/18  Yes Fulp, Cammie, MD  insulin aspart (NOVOLOG FLEXPEN) 100 UNIT/ML FlexPen Inject 50 Units into the skin 3 (three) times daily with meals. And pen needles 4/day Patient taking differently: Inject 58 Units into the skin 4 (four) times daily. And pen needles 4/day 08/15/18  Yes Ladell Pier, MD  Insulin Glargine (LANTUS SOLOSTAR) 100 UNIT/ML Solostar Pen Inject 65 Units into the skin at bedtime. Patient taking differently:  Inject 65 Units into the skin daily.  05/20/18  Yes Renato Shin, MD  JANUVIA 50 MG tablet TAKE 1 TABLET (50 MG TOTAL) BY MOUTH DAILY. Patient taking differently: Take 50 mg by mouth daily.  08/12/18  Yes Fulp, Cammie, MD  levothyroxine (SYNTHROID) 100 MCG tablet Take 1 tablet (100 mcg total) by mouth daily before breakfast. 07/14/18  Yes Ladell Pier, MD  liraglutide (VICTOZA) 18 MG/3ML SOPN 1.8 mg daily 02/17/18  Yes Ladell Pier, MD  lisinopril (PRINIVIL,ZESTRIL) 40 MG tablet TAKE 1 TABLET BY MOUTH DAILY. 03/15/18  Yes Ladell Pier, MD  Multiple Vitamins-Minerals (EYE VITAMINS) CAPS Take 1 capsule by mouth daily. 08/11/12  Yes Thurnell Lose, MD  spironolactone (ALDACTONE) 25 MG tablet Take 25 mg, one tablet, in the morning and then 12.5 mg, half a tablet, in the afternoon. 05/19/18  Yes Fulp, Cammie, MD  torsemide (DEMADEX) 20 MG tablet Take 1 tablet by mouth daily as needed (fluid).  05/09/18  Yes [provider]  ACCU-CHEK SOFTCLIX LANCETS lancets USE AS DIRECTED TO TEST TWICE DAILY 04/27/18   Ladell Pier, MD  acetaminophen (TYLENOL) 500 MG tablet Take 1 tablet (500 mg total) by mouth every 6 (six) hours as needed. 09/16/18   Darlin Drop P, PA-C  azithromycin (ZITHROMAX) 250 MG tablet Take 2 tabs (500 mg) on day 1 then 1 tab (250 mg) on days 2-5 Patient not taking: Reported on 09/15/2018 08/17/18   Charlott Rakes, MD  Blood Glucose Monitoring Suppl (ACCU-CHEK AVIVA PLUS) w/Device KIT USE AS DIRECTED TO TEST TWICE DAILY 04/27/18   Ladell Pier, MD  chlorpheniramine-HYDROcodone (TUSSIONEX PENNKINETIC ER) 10-8 MG/5ML SUER Take 5 mLs by mouth 2 (two) times daily. Patient not taking: Reported on 09/15/2018 08/03/18   Charlott Rakes, MD  doxycycline (VIBRA-TABS) 100 MG tablet Take 1 tablet (100 mg total) by mouth 2 (two) times daily. Patient not taking: Reported on 09/15/2018 08/03/18   Charlott Rakes, MD  fluticasone (FLONASE) 50 MCG/ACT nasal spray Place 2  sprays into both nostrils daily. Patient taking differently: Place 2 sprays into both nostrils daily as needed for allergies.  08/17/18   Charlott Rakes, MD  glucose blood (ACCU-CHEK AVIVA PLUS) test strip USE AS DIRECTED TO TEST TWICE DAILY 04/27/18   Ladell Pier, MD  mometasone (NASONEX) 50 MCG/ACT nasal spray Place 2 sprays into the nose daily. Patient taking differently: Place 2 sprays into the nose daily as needed (allergies).  08/24/17   Ladell Pier, MD  nitrofurantoin, macrocrystal-monohydrate, (MACROBID) 100 MG capsule Take 1 capsule (100 mg total) by mouth 2 (two)  times daily for 5 days. 09/16/18 09/21/18  Alysiah Suppa P, PA-C  NOVOLOG FLEXPEN 100 UNIT/ML FlexPen INJECT 50 UNITS INTO THE SKIN 3 TIMES DAILY WITH MEALS. Patient not taking: Reported on 09/15/2018 08/15/18   Renato Shin, MD  NYSTATIN powder APPLY TOPICALLY 4 TIMES DAILY. Patient taking differently: Apply 1 Bottle topically 4 (four) times daily as needed (rash).  01/18/17   Langeland, Leda Quail, MD  ondansetron (ZOFRAN) 4 MG tablet Take 1 tablet (4 mg total) by mouth every 6 (six) hours. 09/16/18   Arville Lime, PA-C    Family History Family History  Problem Relation Age of Onset  . Cancer Mother        cervical- mets  . Cancer Father   . Heart disease Father   . Diabetes Father   . Diabetes Sister   . Heart disease Sister   . Heart disease Sister   . COPD Sister     Social History Social History   Tobacco Use  . Smoking status: Never Smoker  . Smokeless tobacco: Never Used  Substance Use Topics  . Alcohol use: No  . Drug use: No     Allergies   Amoxicillin-pot clavulanate; Hydrocodone-acetaminophen; Oxycodone; Sulfa antibiotics; Norvasc [amlodipine besylate]; Sulfamethoxazole; and Tramadol   Review of Systems Review of Systems  Constitutional: Negative for activity change, appetite change, chills, fever and unexpected weight change.  HENT: Negative for congestion, rhinorrhea and sore  throat.   Eyes: Negative for visual disturbance.  Respiratory: Positive for shortness of breath. Negative for cough.   Cardiovascular: Positive for leg swelling. Negative for chest pain.  Gastrointestinal: Positive for abdominal pain and nausea. Negative for constipation, diarrhea and vomiting.  Endocrine: Negative for polydipsia, polyphagia and polyuria.  Genitourinary: Negative for dysuria, flank pain and frequency.  Musculoskeletal: Negative for back pain.  Skin: Negative for rash.  Allergic/Immunologic: Negative for immunocompromised state.  Neurological: Negative for dizziness, weakness and numbness.  Psychiatric/Behavioral: The patient is not nervous/anxious.      Physical Exam Updated Vital Signs BP (!) 125/59   Pulse 83   Temp 98.8 F (37.1 C) (Oral)   Resp 18   Ht '5\' 6"'  (1.676 m)   Wt (!) 138.3 kg   SpO2 97%   BMI 49.23 kg/m   Physical Exam Vitals signs and nursing note reviewed.  Constitutional:      General: She is not in acute distress.    Appearance: She is well-developed. She is not diaphoretic.  HENT:     Head: Normocephalic and atraumatic.  Neck:     Musculoskeletal: Normal range of motion and neck supple.  Cardiovascular:     Rate and Rhythm: Normal rate and regular rhythm.     Heart sounds: Normal heart sounds. No murmur. No friction rub. No gallop.   Pulmonary:     Effort: Pulmonary effort is normal. No respiratory distress.     Breath sounds: Normal breath sounds. No wheezing or rales.  Abdominal:     General: Abdomen is protuberant. Bowel sounds are normal. There is no distension.     Palpations: Abdomen is soft. Abdomen is not rigid. There is no mass.     Tenderness: There is generalized abdominal tenderness and tenderness in the epigastric area. There is guarding. There is no right CVA tenderness, left CVA tenderness or rebound.     Hernia: No hernia is present.  Musculoskeletal: Normal range of motion.     Right lower leg: 1+ Pitting Edema  present.  Left lower leg: 1+ Pitting Edema present.  Skin:    General: Skin is warm.     Findings: No rash.  Neurological:     Mental Status: She is alert.      ED Treatments / Results  Labs (all labs ordered are listed, but only abnormal results are displayed) Labs Reviewed  LIPASE, BLOOD - Abnormal; Notable for the following components:      Result Value   Lipase 79 (*)    All other components within normal limits  COMPREHENSIVE METABOLIC PANEL - Abnormal; Notable for the following components:   Glucose, Bld 255 (*)    BUN 24 (*)    Creatinine, Ser 1.16 (*)    Calcium 8.6 (*)    Total Protein 6.2 (*)    GFR calc non Af Amer 49 (*)    GFR calc Af Amer 57 (*)    All other components within normal limits  CBC - Abnormal; Notable for the following components:   RBC 3.81 (*)    Hemoglobin 11.4 (*)    HCT 35.6 (*)    All other components within normal limits  URINALYSIS, ROUTINE W REFLEX MICROSCOPIC - Abnormal; Notable for the following components:   APPearance HAZY (*)    Glucose, UA 50 (*)    Leukocytes, UA LARGE (*)    WBC, UA >50 (*)    Bacteria, UA RARE (*)    All other components within normal limits  TROPONIN I  I-STAT TROPONIN, ED    EKG EKG Interpretation  Date/Time:  Thursday September 15 2018 18:51:34 EST Ventricular Rate:  89 PR Interval:    QRS Duration: 92 QT Interval:  376 QTC Calculation: 458 R Axis:   -10 Text Interpretation:  Sinus rhythm Low voltage, precordial leads Abnormal R-wave progression, early transition Baseline wander in lead(s) V2 No significant change since last tracing Confirmed by Isla Pence [phone]) on 09/15/2018 6:55:39 PM   Radiology Ct Abdomen Pelvis W Contrast  Result Date: 09/15/2018 CLINICAL DATA:  Upper abdominal pain starting yesterday. EXAM: CT ABDOMEN AND PELVIS WITH CONTRAST TECHNIQUE: Multidetector CT imaging of the abdomen and pelvis was performed using the standard protocol following bolus administration of  intravenous contrast. CONTRAST:  15m ISOVUE-300 IOPAMIDOL (ISOVUE-300) INJECTION 61% COMPARISON:  12/24/2013 FINDINGS: Lower chest: Top normal heart size without pericardial effusion or thickening. There is dependent bibasilar atelectasis. Small focus of subsegmental atelectasis is seen in the lingula. Hepatobiliary: No focal liver abnormality is seen. No gallstones, gallbladder wall thickening, or biliary dilatation. Pancreas: Unremarkable. No pancreatic ductal dilatation or surrounding inflammatory changes. Spleen: Normal in size without focal abnormality. Adrenals/Urinary Tract: Adrenal glands are unremarkable. Kidneys are normal, without renal calculi, focal lesion, or hydronephrosis. Bladder is unremarkable. Stomach/Bowel: Small hiatal hernia. Physiologic distention of the stomach with ingested food. The duodenal sweep and ligament of Treitz are unremarkable. Normal small bowel rotation without obstruction or inflammation. The distal and terminal ileum are normal. The appendix is normal. Moderate stool retention within the colon is seen without mechanical bowel obstruction. Vascular/Lymphatic: Nonaneurysmal minimally atherosclerotic aorta and branch vessels. No lymphadenopathy. Reproductive: Uterus and bilateral adnexa are unremarkable. Other: Small fat containing inguinal hernias. No free air nor free fluid. Musculoskeletal: Degenerative disc disease L1-2 and L3-4. IMPRESSION: 1. No acute bowel inflammation or obstruction. Moderate stool retention within the colon. 2. Small fat containing inguinal hernias. 3. Degenerative disc disease L1-2 and L3-4. Electronically Signed   By: DAshley RoyaltyM.D.   On: 09/15/2018 20:13  Procedures Procedures (including critical care time)  Medications Ordered in ED Medications  iopamidol (ISOVUE-300) 61 % injection (has no administration in time range)  sodium chloride (PF) 0.9 % injection (has no administration in time range)  0.9 %  sodium chloride infusion (500  mLs Intravenous New Bag/Given 09/15/18 2055)  ondansetron (ZOFRAN) injection 4 mg (4 mg Intravenous Given 09/15/18 1905)  sodium chloride 0.9 % bolus 1,000 mL (0 mLs Intravenous Stopped 09/15/18 2223)  famotidine (PEPCID) IVPB 20 mg premix (0 mg Intravenous Stopped 09/15/18 2224)  iopamidol (ISOVUE-300) 61 % injection 100 mL (100 mLs Intravenous Contrast Given 09/15/18 1952)  alum & mag hydroxide-simeth (MAALOX/MYLANTA) 200-200-20 MG/5ML suspension 30 mL (30 mLs Oral Given 09/15/18 2049)    And  lidocaine (XYLOCAINE) 2 % viscous mouth solution 15 mL (15 mLs Oral Given 09/15/18 2048)  fentaNYL (SUBLIMAZE) injection 50 mcg (50 mcg Intravenous Given 09/15/18 2237)  nitrofurantoin (macrocrystal-monohydrate) (MACROBID) capsule 100 mg (100 mg Oral Given 09/15/18 2239)  fentaNYL (SUBLIMAZE) injection 100 mcg (100 mcg Intravenous Given 09/15/18 2346)     Initial Impression / Assessment and Plan / ED Course  I have reviewed the triage vital signs and the nursing notes.  Pertinent labs & imaging results that were available during my care of the patient were reviewed by me and considered in my medical decision making (see chart for details).  Clinical Course as of Sep 16 9  Thu Sep 15, 2018  1902 UA reveals large leukocytes, WBCs, and bacteria consistent with a UTI.  WBC, UA(!): >50 [AH]  2028 CT reveals no acute bowel inflammation or obstruction. Moderate stool retention within the colon.     CT Abdomen Pelvis W Contrast [AH]  2251 Patient is still having epigastric tenderness on exam. Patient was provided Fentanyl without relief. Provided more pain medicine to control her pain.   [AH]    Clinical Course User Index [AH] Arville Lime, PA-C   Patient is nontoxic, nonseptic appearing, in no apparent distress.  Patient's pain and other symptoms adequately managed in emergency department.  Fluid bolus given.  Labs, imaging and vitals reviewed.  CT does not reveal acute bowel inflammation or  obstruction. EKG is unchanged and troponin negative. UA reveals urinary tract infection. Will treat with antibiotics. Patient does not meet the SIRS or Sepsis criteria.  On repeat exam patient does not have a surgical abdomin and there are no peritoneal signs.  No indication of appendicitis, bowel obstruction, bowel perforation, cholecystitis, diverticulitis.  Provided tylenol for pain due to allergies to hydrocodone, oxycodone, and tramadol. Patient denies an allergy to tylenol. Prescribed zofran for nausea and antibiotics for UTI. Patient discharged home with symptomatic treatment and given strict instructions for follow-up with their primary care physician.  I have also discussed reasons to return immediately to the ER. Patient expresses understanding and agrees with plan.  Findings and plan of care discussed with supervising physician Dr. Gilford Raid who personally evaluated and examined this patient.   Final Clinical Impressions(s) / ED Diagnoses   Final diagnoses:  Epigastric pain    ED Discharge Orders         Ordered    ondansetron (ZOFRAN) 4 MG tablet  Every 6 hours     09/16/18 0009    nitrofurantoin, macrocrystal-monohydrate, (MACROBID) 100 MG capsule  2 times daily     09/16/18 0009    acetaminophen (TYLENOL) 500 MG tablet  Every 6 hours PRN     09/16/18 0009  Darlin Drop [place], [place] 09/16/18 0010    Isla Pence, MD 09/16/18 [phone]

## 2018-09-15 NOTE — ED Notes (Signed)
Urine request made 

## 2018-09-15 NOTE — Discharge Instructions (Addendum)
You have been seen today for abdominal pain. Please read and follow all provided instructions.   1. Medications: antibiotic for urinary tract infection, usual home medications 2. Treatment: rest, drink plenty of fluids 3. Follow Up: Please follow up with your primary doctor in 2 days for discussion of your diagnoses and further evaluation after today's visit; if you do not have a primary care doctor use the resource guide provided to find one; Please return to the ER for any new or worsening symptoms. Please obtain all of your results from medical records or have your doctors office obtain the results - share them with your doctor - you should be seen at your doctors office. Call today to arrange your follow up.   Take medications as prescribed. Please review all of the medicines and only take them if you do not have an allergy to them. Return to the emergency room for worsening condition or new concerning symptoms. Follow up with your regular doctor. If you don't have a regular doctor use one of the numbers below to establish a primary care doctor.  Please be aware that if you are taking birth control pills, taking other prescriptions, ESPECIALLY ANTIBIOTICS may make the birth control ineffective - if this is the case, either do not engage in sexual activity or use alternative methods of birth control such as condoms until you have finished the medicine and your family doctor says it is OK to restart them. If you are on a blood thinner such as COUMADIN, be aware that any other medicine that you take may cause the coumadin to either work too much, or not enough - you should have your coumadin level rechecked in next 7 days if this is the case.  ?  It is also a possibility that you have an allergic reaction to any of the medicines that you have been prescribed - Everybody reacts differently to medications and while MOST people have no trouble with most medicines, you may have a reaction such as nausea,  vomiting, rash, swelling, shortness of breath. If this is the case, please stop taking the medicine immediately and contact your physician.  ?  You should return to the ER if you develop severe or worsening symptoms.   Emergency Department Resource Guide 1) Find a Doctor and Pay Out of Pocket Although you won't have to find out who is covered by your insurance plan, it is a good idea to ask around and get recommendations. You will then need to call the office and see if the doctor you have chosen will accept you as a new patient and what types of options they offer for patients who are self-pay. Some doctors offer discounts or will set up payment plans for their patients who do not have insurance, but you will need to ask so you aren't surprised when you get to your appointment.  2) Contact Your Local Health Department Not all health departments have doctors that can see patients for sick visits, but many do, so it is worth a call to see if yours does. If you don't know where your local health department is, you can check in your phone book. The CDC also has a tool to help you locate your state's health department, and many state websites also have listings of all of their local health departments.  3) Find a [facility] If your illness is not likely to be very severe or complicated, you may want to try a walk in clinic. These  are popping up all over the country in pharmacies, drugstores, and shopping centers. They're usually staffed by nurse practitioners or physician assistants that have been trained to treat common illnesses and complaints. They're usually fairly quick and inexpensive. However, if you have serious medical issues or chronic medical problems, these are probably not your best option.  No Primary Care Doctor: Call Health Connect at  [phone] - they can help you locate a primary care doctor that  accepts your insurance, provides certain services, etc. Physician Referral [phone]  Emergency Department Resource Guide 1) Find a Doctor and Pay Out of Pocket Although you won't have to find out who is covered by your insurance plan, it is a good idea to ask around and get recommendations. You will then need to call the office and see if the doctor you have chosen will accept you as a new patient and what types of options they offer for patients who are self-pay. Some doctors offer discounts or will set up payment plans for their patients who do not have insurance, but you will need to ask so you aren't surprised when you get to your appointment.  2) Contact Your Local Health Department Not all health departments have doctors that can see patients for sick visits, but many do, so it is worth a call to see if yours does. If you don't know where your local health department is, you can check in your phone book. The CDC also has a tool to help you locate your state's health department, and many state websites also have listings of all of their local health departments.  3) Find a [facility] If your illness is not likely to be very severe or complicated, you may want to try a walk in clinic. These are popping up all over the country in pharmacies, drugstores, and shopping centers. They're usually staffed by nurse practitioners or physician assistants that have been trained to treat common illnesses and complaints. They're usually fairly quick and inexpensive. However, if you have serious medical issues or chronic medical problems, these are probably not your best option.  No Primary Care Doctor: Call Health Connect at  [phone] - they can help you locate a primary care doctor that  accepts your insurance, provides certain services, etc. Physician Referral Service- [phone]  Chronic Pain Problems: Organization         Address  Phone   Notes  [facility]  [phone] Patients need to be referred by their primary care doctor.    Medication Assistance: Organization         Address  Phone   Notes  [facility] Medication [facility] [address] [place], [place] [address] [phone] --Must be a resident of [facility] -- Must have NO insurance coverage whatsoever (no Medicaid/ Medicare, etc.) -- The pt. MUST have a primary care doctor that directs their care regularly and follows them in the community   [facility]  [phone]   [employer]  [phone]    Agencies that provide inexpensive medical care: Organization         Address  Phone   Notes  [place]  [phone]   [facility]    [phone]   [facility] [place] [place],  [phone]   [place] [address] [place] [phone]   [facility]anned [facility]    (  [phone]   [facility]    [phone]   [facility]  [address], [place] Phone:  [phone], Fax:  [phone] Hours of Operation:  9 am - 6 pm, M-F.  Also accepts Medicaid/Medicare and self-pay.  [facility] for [place] [address], [address], [place] Phone: [phone], Fax: [phone]. Hours of Operation:  8:30 am - 5:30 pm, M-F.  Also accepts Medicaid and self-pay.  [facility] [place] [address], [place] Phone: [phone]   [place], [place], [place] [phone], Ext. [phone] Mondays & Thursdays: 7-9 AM.  First 15 patients are seen on a first come, first serve basis.    [place] Providers:  Organization         Address  Phone   Notes  [facility] [address], [place] [phone] Also accepts self-pay patients.  [facility] [address], [place]  [phone]   [place], [address], [place] [phone]   [facility] [address] [place] [phone]   Lucianne Lei [address], [place]   [phone] Only accepts [place] [facility] [place] patients after they have their name applied to their card.   Self-Pay (no insurance) in [facility]:  Organization         Address  Phone   Notes  Sickle Cell Patients, [facility] [place] [phone]   [facility] [place] [phone]   [facility] [place]  [place], [place], [place] [phone]   [facility]/Dr. Osei-Bonsu  [address], [place] or [address], [place] [phone] Phone number for both [place] and [place] locations is the same.  [facility] [address], [place] [phone]   [facility] [address] [place] or [address] [phone]   [facility] [address] [place] [phone], phone; [phone], fax Sees patients 1st and 3rd Saturday of every month.  Must not qualify for public or private insurance (i.e. Medicaid, Medicare, [place] Health Choice, Veterans' Benefits)  Household income should be no more than 200% of the poverty level The clinic cannot treat you if you are pregnant or think you are pregnant  Sexually transmitted diseases are not treated at the clinic.

## 2018-09-15 NOTE — ED Triage Notes (Signed)
Patient here from home with complaints of upper abd pain that started yesterday. Nausea, no vomiting. Reports mid upper epigastric pain.

## 2018-09-16 DIAGNOSIS — R1013 Epigastric pain: Secondary | ICD-10-CM | POA: Diagnosis not present

## 2018-09-16 MED ORDER — ACETAMINOPHEN 500 MG PO TABS: 500.0000 mg | ORAL_TABLET | Freq: Four times a day (QID) | ORAL | 0 refills | Status: DC | PRN

## 2018-09-16 MED ORDER — ONDANSETRON HCL 4 MG PO TABS: 4.0000 mg | ORAL_TABLET | Freq: Four times a day (QID) | ORAL | 0 refills | Status: DC

## 2018-09-16 MED ORDER — NITROFURANTOIN MONOHYD MACRO 100 MG PO CAPS: 100.0000 mg | ORAL_CAPSULE | Freq: Two times a day (BID) | ORAL | 0 refills | Status: AC

## 2018-09-16 MED FILL — NITROFURANTOIN MONO-MCR 100: 100 | 5 days supply | Qty: 10 | Fill #0

## 2018-09-16 MED FILL — ONDANSETRON HCL 4 MG TABLET: 4 | 4 days supply | Qty: 12 | Fill #0

## 2018-09-19 ENCOUNTER — Encounter: Payer: Self-pay | Admitting: Gastroenterology

## 2018-09-20 ENCOUNTER — Telehealth: Payer: Self-pay | Admitting: Pharmacist

## 2018-09-20 ENCOUNTER — Other Ambulatory Visit: Payer: Self-pay | Admitting: Pharmacist

## 2018-09-20 MED ORDER — INSULIN LISPRO (1 UNIT DIAL) 100 UNIT/ML (KWIKPEN): 50.0000 [IU] | PEN_INJECTOR | Freq: Three times a day (TID) | SUBCUTANEOUS | 2 refills | Status: DC

## 2018-09-20 NOTE — Telephone Encounter (Signed)
Notified that patient's insurance prefers Humalog Kwikpen. Called and informed pt of this. After discussion, pt is amenable to trying Humalog. Will send in rx for Humalog.

## 2018-09-29 ENCOUNTER — Telehealth: Payer: Self-pay | Admitting: Internal Medicine

## 2018-09-29 ENCOUNTER — Encounter: Payer: Self-pay | Admitting: Gastroenterology

## 2018-09-29 ENCOUNTER — Ambulatory Visit (INDEPENDENT_AMBULATORY_CARE_PROVIDER_SITE_OTHER): Payer: Medicare Other | Admitting: Gastroenterology

## 2018-09-29 VITALS — BP 126/74 | HR 68 | Ht 66.0 in | Wt 306.0 lb

## 2018-09-29 DIAGNOSIS — R1011 Right upper quadrant pain: Secondary | ICD-10-CM

## 2018-09-29 DIAGNOSIS — R1013 Epigastric pain: Secondary | ICD-10-CM

## 2018-09-29 DIAGNOSIS — K219 Gastro-esophageal reflux disease without esophagitis: Secondary | ICD-10-CM

## 2018-09-29 NOTE — Patient Instructions (Addendum)
If you are age 66 or older, your body mass index should be between 23-30. Your Body mass index is 49.39 kg/m. If this is out of the aforementioned range listed, please consider follow up with your Primary Care Provider.  If you are age 68 or younger, your body mass index should be between 19-25. Your Body mass index is 49.39 kg/m. If this is out of the aformentioned range listed, please consider follow up with your Primary Care Provider.   You have been scheduled for an endoscopy. Please follow written instructions given to you at your visit today. If you use inhalers (even only as needed), please bring them with you on the day of your procedure. Your physician has requested that you go to www.startemmi.com and enter the access code given to you at your visit today. This web site gives a general overview about your procedure. However, you should still follow specific instructions given to you by our office regarding your preparation for the procedure.  It was a pleasure to see you today!  Vito Cirigliano, D.O.

## 2018-09-29 NOTE — Telephone Encounter (Signed)
Have not received paperwork but will have to get Dr. Wynetta Emery okay to give verbal orders

## 2018-09-29 NOTE — Progress Notes (Signed)
Chief Complaint: RUQ Pain, MEG pain   Referring Provider:     Isla Pence, MD [facility] ER)   HPI:     Anita James is a 68 y.o. female referred to the Gastroenterology Clinic for evaluation of acute onset MEG pain, starting on 09/15/18 and presented to the ER that day for evaluation. +nausea without emesis which has since improved but not resolved.  CT was unrevealing. Mild AKI with o.w normal CMP. Mildly elevated lipase at 79. CBC normal. UA positive and treated for pyruria with Abx.   Pain has since moved to RUQ and described as sharp, stabbing pain earlier this week.  Symptoms not exacerbated by p.o. intake, activity, times a day.  No prior similar symptoms.  No previous history of PUD, gastritis, etc.  No associated change in bowel habits, hematochezia, melena, fever, chills.       She does report a longer standing history of GERD, currenlty treated with Dexilant for many years, with control of reflux. Index sxs of HB, regurgitation. No prior EGD.  No dysphagia or odynophagia.  Past Medical History:  Diagnosis Date  . Anxiety   . Carpal tunnel syndrome 06/14/2014  . Chest pain   . CHF (congestive heart failure) ([place])   . Chronic back pain   . Chronic kidney disease 03/2013   failure due to meds  . Diabetes mellitus type II, uncontrolled ([place])   . Diverticulitis   . Frequent headaches   . Hiatal hernia   . Hyperlipemia   . Hypertension   . Hypothyroidism   . 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 Neuropathy   . Obesity   . Pneumonia   . Vitamin D deficiency 06/05/2014     Past Surgical History:  Procedure Laterality Date  . CESAREAN SECTION    . KNEE SURGERY Left   . THYROID SURGERY     goiter   Family History  Problem Relation Age of Onset  . Cancer Mother        cervical- mets  . Cancer Father   . Heart disease Father   . Diabetes Father   . Diabetes Sister   . Heart disease Sister   . Heart disease Sister   . COPD Sister   . Colon cancer Neg Hx     Social History   Tobacco Use  . Smoking status: Never Smoker  . Smokeless tobacco: Never Used  Substance Use Topics  . Alcohol use: No  . Drug use: No   Current Outpatient Medications  Medication Sig Dispense Refill  . ACCU-CHEK SOFTCLIX LANCETS lancets USE AS DIRECTED TO TEST TWICE DAILY 100 each 12  . acetaminophen (TYLENOL) 500 MG tablet Take 1 tablet (500 mg total) by mouth every 6 (six) hours as needed. 30 tablet 0  . aspirin EC 81 MG tablet Take 81 mg by mouth daily.    Marland Kitchen atorvastatin (LIPITOR) 40 MG tablet TAKE 1 TABLET BY MOUTH DAILY AT 6 PM. (Patient taking differently: Take 40 mg by mouth daily. TAKE 1 TABLET BY MOUTH DAILY AT 6 PM.) 30 tablet 2  . Blood Glucose Monitoring Suppl (ACCU-CHEK AVIVA PLUS) w/Device KIT USE AS DIRECTED TO TEST TWICE DAILY 1 kit 0  . carvedilol (COREG) 12.5 MG tablet Take a tablet and a half, 18.75 mg, in the morning and one tablet, 12.5 mg, at night. 225 tablet 3  . Cetirizine HCl 10 MG CAPS Take 1 capsule (10  mg total) by mouth daily. 30 capsule 0  . DEXILANT 60 MG capsule TAKE 1 CAPSULE BY MOUTH ONCE DAILY 90 capsule 0  . doxycycline (VIBRA-TABS) 100 MG tablet Take 1 tablet (100 mg total) by mouth 2 (two) times daily. 20 tablet 0  . fluticasone (FLONASE) 50 MCG/ACT nasal spray Place 2 sprays into both nostrils daily. (Patient taking differently: Place 2 sprays into both nostrils daily as needed for allergies. ) 16 g 1  . gabapentin (NEURONTIN) 300 MG capsule TAKE 2 CAPSULES BY MOUTH 2 TIMES DAILY. (Patient taking differently: Take 600 mg by mouth 2 (two) times daily. ) 120 capsule 2  . glucose blood (ACCU-CHEK AVIVA PLUS) test strip USE AS DIRECTED TO TEST TWICE DAILY 100 each 12  . Insulin Glargine (LANTUS SOLOSTAR) 100 UNIT/ML Solostar Pen Inject 65 Units into the skin at bedtime. (Patient taking differently: Inject 65 Units into the skin daily. ) 10 pen 11  . insulin lispro (HUMALOG KWIKPEN) 100 UNIT/ML KwikPen Inject 0.5 mLs (50 Units total)  into the skin 3 (three) times daily. 45 mL 2  . JANUVIA 50 MG tablet TAKE 1 TABLET (50 MG TOTAL) BY MOUTH DAILY. (Patient taking differently: Take 50 mg by mouth daily. ) 30 tablet 2  . levothyroxine (SYNTHROID) 100 MCG tablet Take 1 tablet (100 mcg total) by mouth daily before breakfast. 30 tablet 6  . liraglutide (VICTOZA) 18 MG/3ML SOPN 1.8 mg daily 9 mL 3  . lisinopril (PRINIVIL,ZESTRIL) 40 MG tablet TAKE 1 TABLET BY MOUTH DAILY. 90 tablet 3  . mometasone (NASONEX) 50 MCG/ACT nasal spray Place 2 sprays into the nose daily. (Patient taking differently: Place 2 sprays into the nose daily as needed (allergies). ) 17 g 3  . Multiple Vitamins-Minerals (EYE VITAMINS) CAPS Take 1 capsule by mouth daily.    . NYSTATIN powder APPLY TOPICALLY 4 TIMES DAILY. (Patient taking differently: Apply 1 Bottle topically 4 (four) times daily as needed (rash). ) 15 g 0  . ondansetron (ZOFRAN) 4 MG tablet Take 1 tablet (4 mg total) by mouth every 6 (six) hours. 12 tablet 0  . spironolactone (ALDACTONE) 25 MG tablet Take 25 mg, one tablet, in the morning and then 12.5 mg, half a tablet, in the afternoon. 135 tablet 3  . torsemide (DEMADEX) 20 MG tablet Take 1 tablet by mouth daily as needed (fluid).   11   No current facility-administered medications for this visit.    Allergies  Allergen Reactions  . Amoxicillin-Pot Clavulanate Nausea And Vomiting and Other (See Comments)    Combination of medications tear lining of stomach  . Hydrocodone-Acetaminophen Shortness Of Breath  . Oxycodone Shortness Of Breath    oxycontin too  . Sulfa Antibiotics Shortness Of Breath  . Norvasc [Amlodipine Besylate] Swelling    feet  . Sulfamethoxazole Nausea Only  . Tramadol Other (See Comments)    Unknown, toxicity. Patient advised she had to be given Narcan with this     Review of Systems: All systems reviewed and negative except where noted in HPI.     Physical Exam:    Wt Readings from Last 3 Encounters:  09/29/18  (!) 306 lb (138.8 kg)  09/15/18 (!) 305 lb (138.3 kg)  08/03/18 (!) 309 lb (140.2 kg)    BP 126/74   Pulse 68   Ht 5' 6" (1.676 m)   Wt (!) 306 lb (138.8 kg)   BMI 49.39 kg/m  Constitutional:  Pleasant, in no acute distress. Psychiatric: Normal mood and  affect. Behavior is normal. EENT: Pupils normal.  Conjunctivae are normal. No scleral icterus. Neck supple. No cervical LAD. Cardiovascular: Normal rate, regular rhythm. No edema Pulmonary/chest: Effort normal and breath sounds normal. No wheezing, rales or rhonchi. Abdominal: Soft, nondistended, nontender. Bowel sounds active throughout. There are no masses palpable. No hepatomegaly. Neurological: Alert and oriented to person place and time. Skin: Skin is warm and dry. No rashes noted.   ASSESSMENT AND PLAN;   Zhana Jeangilles is a 68 y.o. female presenting with:  1) MEG/RUQ pain: Acute onset MEG pain on 09/15/2018 without provoking factors.  ER eval only notable for pyuria which was treated with antibiotics with no appreciable change in her abdominal pain.  Pain now also in the RUQ.  Will evaluate and treat as below:  -Discussed treating for PUD/gastritis with additional medications along with lab evaluation for H. Pylori vs endoscopic evaluation for PUD, gastritis, H. pylori, etc.  Given duration and bothersome nature of symptoms, she would like to proceed with the latter. - EGD to evaluate for mucosal/luminal etiology for symptoms - Plan for random and directed biopsies for H. pylori  2) GERD: Reflux symptoms well controlled on Dexilant.  No change at this time  The indications, risks, and benefits of EGD were explained to the patient in detail. Risks include but are not limited to bleeding, perforation, adverse reaction to medications, and cardiopulmonary compromise. Sequelae include but are not limited to the possibility of surgery, hositalization, and mortality. The patient verbalized understanding and wished to proceed. All  questions answered, referred to scheduler. Further recommendations pending results of the exam.     Lavena Bullion, DO, FACG  09/29/2018, 10:52 AM   [place]ad[place] Pier, MD

## 2018-09-29 NOTE — Telephone Encounter (Signed)
Patient called to speak with [place] regarding a referral to another Dr. Gabriel Carina. Patient stated that she wanted a call before 5 pm today and was informed that I did not guarantee a call back before 5 pm. Patient also stated that their is always something wrong going on with the clinic. Please f/u

## 2018-09-29 NOTE — Telephone Encounter (Signed)
Anita James from [facility] called stating that he faxed over a form to be filled out by pt. PCP for incontinence supply for adult pull on's. Patient states she needs the supplies ASAP and Katrina Stack would like to know if PCP can give a verbal for the first shipment until the form is filled out and faxed back.

## 2018-10-06 ENCOUNTER — Encounter: Payer: Self-pay | Admitting: Gastroenterology

## 2018-10-06 ENCOUNTER — Ambulatory Visit (AMBULATORY_SURGERY_CENTER): Payer: Medicare Other | Admitting: Gastroenterology

## 2018-10-06 VITALS — BP 145/70 | HR 82 | Temp 97.3°F | Resp 26 | Ht 66.0 in | Wt 306.0 lb

## 2018-10-06 DIAGNOSIS — R1013 Epigastric pain: Secondary | ICD-10-CM

## 2018-10-06 DIAGNOSIS — K317 Polyp of stomach and duodenum: Secondary | ICD-10-CM | POA: Diagnosis not present

## 2018-10-06 DIAGNOSIS — K3189 Other diseases of stomach and duodenum: Secondary | ICD-10-CM | POA: Diagnosis not present

## 2018-10-06 DIAGNOSIS — K219 Gastro-esophageal reflux disease without esophagitis: Secondary | ICD-10-CM | POA: Diagnosis not present

## 2018-10-06 DIAGNOSIS — K2970 Gastritis, unspecified, without bleeding: Secondary | ICD-10-CM | POA: Diagnosis not present

## 2018-10-06 DIAGNOSIS — K449 Diaphragmatic hernia without obstruction or gangrene: Secondary | ICD-10-CM

## 2018-10-06 MED ORDER — SODIUM CHLORIDE 0.9 % IV SOLN: 500.0000 mL | Freq: Once | INTRAVENOUS | Status: DC

## 2018-10-06 NOTE — Progress Notes (Signed)
PT taken to PACU. Monitors in place. VSS. Report given to RN. 

## 2018-10-06 NOTE — Op Note (Signed)
[place] Patient Name: Anita James Procedure Date: 10/06/2018 10:07 AM MRN: [id number] Endoscopist: Gerrit Heck , MD Age: 68 Referring MD:  Date of Birth: 07-Aug-1951 Gender: Female Account #: [id number] Procedure:                Upper GI endoscopy Indications:              Epigastric abdominal pain, Abdominal pain in the                            right upper quadrant. Additionally, she has a                            history of GERD which is well controlled with                            Dexilant. Medicines:                Monitored Anesthesia Care Procedure:                Pre-Anesthesia Assessment:                           - Prior to the procedure, a History and Physical                            was performed, and patient medications and                            allergies were reviewed. The patient's tolerance of                            previous anesthesia was also reviewed. The risks                            and benefits of the procedure and the sedation                            options and risks were discussed with the patient.                            All questions were answered, and informed consent                            was obtained. Prior Anticoagulants: The patient has                            taken aspirin, last dose was 1 day prior to                            procedure. ASA Grade Assessment: III - A patient                            with severe systemic disease. After reviewing the  risks and benefits, the patient was deemed in                            satisfactory condition to undergo the procedure.                           After obtaining informed consent, the endoscope was                            passed under direct vision. Throughout the                            procedure, the patient's blood pressure, pulse, and                            oxygen saturations were monitored continuously. The                             Endoscope was introduced through the mouth, and                            advanced to the second part of duodenum. The upper                            GI endoscopy was accomplished without difficulty.                            The patient tolerated the procedure well. Scope In: Scope Out: Findings:                 A small hiatal hernia was present.                           The Z-line was regular and was found 38 cm from the                            incisors.                           The upper third of the esophagus, middle third of                            the esophagus and lower third of the esophagus were                            normal.                           Localized mild inflammation characterized by                            erythema was found in the gastric antrum. Biopsies                            were taken with a cold  forceps for Helicobacter                            pylori testing. Additional biopsies obtained from                            the gastric body to evaluate for Helicobacter                            pylori. Estimated blood loss was minimal.                           Multiple small sessile polyps with no bleeding and                            no stigmata of recent bleeding were found in the                            gastric fundus and in the gastric body. Several of                            these polyps were removed and sampled with a cold                            biopsy forceps for histologic representative                            evaluation. Resection and retrieval were complete.                            Estimated blood loss was minimal.                           A single 6 mm sessile polyp with no bleeding and no                            stigmata of recent bleeding was found in the                            gastric antrum. Due to active ASA use and need for                            deeper sedation,  this was not resected with snare                            but instead biopsies were taken with a cold forceps                            for histology. Estimated blood loss was minimal.                           The duodenal bulb, first portion of the duodenum  and second portion of the duodenum were normal. Complications:            No immediate complications. Estimated Blood Loss:     Estimated blood loss was minimal. Impression:               - Small hiatal hernia.                           - Z-line regular, 38 cm from the incisors.                           - Normal upper third of esophagus, middle third of                            esophagus and lower third of esophagus.                           - Gastritis. Biopsied.                           - Multiple gastric polyps. Resected and retrieved.                           - A single gastric polyp. Biopsied.                           - Normal duodenal bulb, first portion of the                            duodenum and second portion of the duodenum. Recommendation:           - Patient has a contact number available for                            emergencies. The signs and symptoms of potential                            delayed complications were discussed with the                            patient. Return to normal activities tomorrow.                            Written discharge instructions were provided to the                            patient.                           - Resume previous diet today.                           - Continue present medications.                           - Await pathology results.                           -  Repeat upper endoscopy for surveillance based on                            pathology results. If the antral polyp is                            adenomatous, I recommend consideration of repeat                            EGD for endoscopic mucosal resection. I recommend                             this be performed at [facility] and to                            hold ASA for 5 days prior to the procedure.                           - Return to GI clinic PRN. Gerrit Heck, MD 10/06/2018 10:29:40 AM

## 2018-10-06 NOTE — Progress Notes (Signed)
Called to room to assist during endoscopic procedure.  Patient ID and intended procedure confirmed with present staff. Received instructions for my participation in the procedure from the performing physician.  

## 2018-10-06 NOTE — Patient Instructions (Signed)
YOU HAD AN ENDOSCOPIC PROCEDURE TODAY AT THE [facility]:   Refer to the procedure report that was given to you for any specific questions about what was found during the examination.  If the procedure report does not answer your questions, please call your gastroenterologist to clarify.  If you requested that your care partner not be given the details of your procedure findings, then the procedure report has been included in a sealed envelope for you to review at your convenience later.  YOU SHOULD EXPECT: Some feelings of bloating in the abdomen. Passage of more gas than usual.  Walking can help get rid of the air that was put into your GI tract during the procedure and reduce the bloating. If you had a lower endoscopy (such as a colonoscopy or flexible sigmoidoscopy) you may notice spotting of blood in your stool or on the toilet paper. If you underwent a bowel prep for your procedure, you may not have a normal bowel movement for a few days.  Please Note:  You might notice some irritation and congestion in your nose or some drainage.  This is from the oxygen used during your procedure.  There is no need for concern and it should clear up in a day or so.  SYMPTOMS TO REPORT IMMEDIATELY:   Following upper endoscopy (EGD)  Vomiting of blood or coffee ground material  New chest pain or pain under the shoulder blades  Painful or persistently difficult swallowing  New shortness of breath  Fever of 100F or higher  Black, tarry-looking stools  For urgent or emergent issues, a gastroenterologist can be reached at any hour by calling [phone].   DIET:  We do recommend a small meal at first, but then you may proceed to your regular diet.  Drink plenty of fluids but you should avoid alcoholic beverages for 24 hours.  ACTIVITY:  You should plan to take it easy for the rest of today and you should NOT DRIVE or use heavy machinery until tomorrow (because of the sedation medicines used  during the test).    FOLLOW UP: Our staff will call the number listed on your records the next business day following your procedure to check on you and address any questions or concerns that you may have regarding the information given to you following your procedure. If we do not reach you, we will leave a message.  However, if you are feeling well and you are not experiencing any problems, there is no need to return our call.  We will assume that you have returned to your regular daily activities without incident.  If any biopsies were taken you will be contacted by phone or by letter within the next 1-3 weeks.  Please call us at [phone] if you have not heard about the biopsies in 3 weeks.    SIGNATURES/CONFIDENTIALITY: You and/or your care partner have signed paperwork which will be entered into your electronic medical record.  These signatures attest to the fact that that the information above on your After Visit Summary has been reviewed and is understood.  Full responsibility of the confidentiality of this discharge information lies with you and/or your care-partner. 

## 2018-10-07 ENCOUNTER — Telehealth: Payer: Self-pay | Admitting: Internal Medicine

## 2018-10-07 ENCOUNTER — Telehealth: Payer: Self-pay

## 2018-10-07 ENCOUNTER — Telehealth: Payer: Self-pay | Admitting: *Deleted

## 2018-10-07 NOTE — Telephone Encounter (Signed)
Left message on f/u call 

## 2018-10-07 NOTE — Telephone Encounter (Signed)
Left message on answering machine. 

## 2018-10-11 ENCOUNTER — Ambulatory Visit: Payer: Medicaid Other | Admitting: Internal Medicine

## 2018-10-11 ENCOUNTER — Other Ambulatory Visit: Payer: Self-pay | Admitting: Family Medicine

## 2018-10-11 DIAGNOSIS — Z794 Long term (current) use of insulin: Secondary | ICD-10-CM

## 2018-10-11 DIAGNOSIS — E1140 Type 2 diabetes mellitus with diabetic neuropathy, unspecified: Secondary | ICD-10-CM

## 2018-10-11 DIAGNOSIS — E118 Type 2 diabetes mellitus with unspecified complications: Secondary | ICD-10-CM

## 2018-10-11 MED FILL — SYNTHROID 100 MCG TABLET: 100 | 30 days supply | Qty: 30 | Fill #3

## 2018-10-11 MED FILL — FLUTICASONE PROP 50 MCG SPR: 50 | 30 days supply | Qty: 16 | Fill #1

## 2018-10-11 MED FILL — LISINOPRIL 40 MG TABLET: 40 | 30 days supply | Qty: 30 | Fill #7

## 2018-10-11 MED FILL — JANUVIA 50 MG TABLET: 50 | 30 days supply | Qty: 30 | Fill #2

## 2018-10-11 MED FILL — CARVEDILOL 12.5 MG TABLET: 12.5 | 30 days supply | Qty: 75 | Fill #10

## 2018-10-11 MED FILL — ATORVASTATIN CALCIUM 40 MG: 40 | 30 days supply | Qty: 30 | Fill #2

## 2018-10-11 MED FILL — HUMALOG 100 UNITS/ML KWIKPE: 100 | 30 days supply | Qty: 45 | Fill #0

## 2018-10-11 MED FILL — TORSEMIDE 20 MG TABLET: 20 | 30 days supply | Qty: 30 | Fill #5

## 2018-10-11 MED FILL — LANTUS SOLOSTAR 100 UNITS/M: 100 | 22 days supply | Qty: 15 | Fill #5

## 2018-10-12 ENCOUNTER — Encounter: Payer: Self-pay | Admitting: Gastroenterology

## 2018-10-12 MED FILL — GABAPENTIN 300 MG CAPSULE: 300 | 30 days supply | Qty: 120 | Fill #0

## 2018-10-13 ENCOUNTER — Other Ambulatory Visit: Payer: Self-pay | Admitting: Pharmacist

## 2018-10-13 ENCOUNTER — Telehealth: Payer: Self-pay | Admitting: Internal Medicine

## 2018-10-13 DIAGNOSIS — E1140 Type 2 diabetes mellitus with diabetic neuropathy, unspecified: Secondary | ICD-10-CM

## 2018-10-13 DIAGNOSIS — E1165 Type 2 diabetes mellitus with hyperglycemia: Principal | ICD-10-CM

## 2018-10-13 DIAGNOSIS — IMO0002 Reserved for concepts with insufficient information to code with codable children: Secondary | ICD-10-CM

## 2018-10-13 MED ORDER — INSULIN PEN NEEDLE 32G X 4 MM MISC: 11 refills | Status: AC

## 2018-10-13 MED FILL — TRUEPLUS PEN NDL 32GX5/32": 32G X 4 MM | 25 days supply | Qty: 100 | Fill #0

## 2018-10-13 MED FILL — TRUEPLUS PEN NDL 32GX5/32: 32G X 4 MM | 25 days supply | Qty: 100 | Fill #0

## 2018-10-13 NOTE — Telephone Encounter (Signed)
Pt request referral to Endocrinology - [facility]   [facility], [address], [place],  [address]  [phone]. Pt states [facility] Loanne Drilling does not take her insurance. Please refer to [facility] for her diabetes.

## 2018-10-13 NOTE — Telephone Encounter (Signed)
Patient don't have a referral . Can you please, forwarder to her provider to place a referral  Thank you

## 2018-10-18 ENCOUNTER — Telehealth: Payer: Self-pay | Admitting: Gastroenterology

## 2018-10-18 NOTE — Telephone Encounter (Signed)
Pt called inquiring about path results. °

## 2018-10-18 NOTE — Telephone Encounter (Signed)
Called and spoke with patient-patient informed of letter/result note and MD recommendations; Letter printed and mailed to patient; Patient is agreeable with plan of care;  Patient verbalized understanding of information/instructions;  Patient was advised to call the office at [phone] if questions/concerns arise;

## 2018-10-21 ENCOUNTER — Ambulatory Visit: Payer: Medicare Other | Attending: Internal Medicine | Admitting: Internal Medicine

## 2018-10-21 ENCOUNTER — Other Ambulatory Visit: Payer: Self-pay | Admitting: Internal Medicine

## 2018-10-21 ENCOUNTER — Encounter: Payer: Self-pay | Admitting: Internal Medicine

## 2018-10-21 VITALS — BP 139/76 | HR 85 | Temp 98.0°F | Resp 16 | Wt 309.6 lb

## 2018-10-21 DIAGNOSIS — E785 Hyperlipidemia, unspecified: Secondary | ICD-10-CM

## 2018-10-21 DIAGNOSIS — E1165 Type 2 diabetes mellitus with hyperglycemia: Secondary | ICD-10-CM

## 2018-10-21 DIAGNOSIS — I10 Essential (primary) hypertension: Secondary | ICD-10-CM | POA: Diagnosis not present

## 2018-10-21 DIAGNOSIS — R5381 Other malaise: Secondary | ICD-10-CM

## 2018-10-21 DIAGNOSIS — Z6841 Body Mass Index (BMI) 40.0 and over, adult: Secondary | ICD-10-CM

## 2018-10-21 DIAGNOSIS — K219 Gastro-esophageal reflux disease without esophagitis: Secondary | ICD-10-CM

## 2018-10-21 DIAGNOSIS — IMO0002 Reserved for concepts with insufficient information to code with codable children: Secondary | ICD-10-CM

## 2018-10-21 DIAGNOSIS — E1140 Type 2 diabetes mellitus with diabetic neuropathy, unspecified: Secondary | ICD-10-CM

## 2018-10-21 DIAGNOSIS — Z1211 Encounter for screening for malignant neoplasm of colon: Secondary | ICD-10-CM

## 2018-10-21 DIAGNOSIS — I5032 Chronic diastolic (congestive) heart failure: Secondary | ICD-10-CM | POA: Diagnosis not present

## 2018-10-21 LAB — POCT GLYCOSYLATED HEMOGLOBIN (HGB A1C): HbA1c, POC (controlled diabetic range): 7.8 % — AB (ref 0.0–7.0)

## 2018-10-21 LAB — GLUCOSE, POCT (MANUAL RESULT ENTRY): POC Glucose: 300 mg/dl — AB (ref 70–99)

## 2018-10-21 MED ORDER — CETIRIZINE HCL 10 MG PO CAPS: 1.0000 | ORAL_CAPSULE | Freq: Every day | ORAL | 0 refills | Status: DC

## 2018-10-21 MED ORDER — LISINOPRIL 40 MG PO TABS: 40.0000 mg | ORAL_TABLET | Freq: Every day | ORAL | 3 refills | Status: DC

## 2018-10-21 MED ORDER — SPIRONOLACTONE 25 MG PO TABS: ORAL_TABLET | ORAL | 3 refills | Status: DC

## 2018-10-21 MED ORDER — INSULIN GLARGINE 100 UNIT/ML SOLOSTAR PEN: 70.0000 [IU] | PEN_INJECTOR | Freq: Every day | SUBCUTANEOUS | 11 refills | Status: DC

## 2018-10-21 MED ORDER — CARVEDILOL 12.5 MG PO TABS: 18.7500 mg | ORAL_TABLET | Freq: Two times a day (BID) | ORAL | 3 refills | Status: DC

## 2018-10-21 MED ORDER — LIRAGLUTIDE 18 MG/3ML ~~LOC~~ SOPN: PEN_INJECTOR | SUBCUTANEOUS | 3 refills | Status: DC

## 2018-10-21 MED ORDER — VITAMIN D2 10 MCG (400 UNIT) PO TABS: 400.0000 [IU] | ORAL_TABLET | Freq: Every day | ORAL | 0 refills | Status: DC

## 2018-10-21 MED ORDER — DEXLANSOPRAZOLE 60 MG PO CPDR: 1.0000 | DELAYED_RELEASE_CAPSULE | Freq: Every day | ORAL | 2 refills | Status: DC

## 2018-10-21 MED ORDER — SITAGLIPTIN PHOSPHATE 50 MG PO TABS: ORAL_TABLET | ORAL | 6 refills | Status: DC

## 2018-10-21 MED ORDER — FLUTICASONE PROPIONATE 50 MCG/ACT NA SUSP: 2.0000 | Freq: Every day | NASAL | 2 refills | Status: DC | PRN

## 2018-10-21 MED ORDER — INSULIN LISPRO (1 UNIT DIAL) 100 UNIT/ML (KWIKPEN): 50.0000 [IU] | PEN_INJECTOR | Freq: Three times a day (TID) | SUBCUTANEOUS | 6 refills | Status: DC

## 2018-10-21 MED ORDER — ATORVASTATIN CALCIUM 40 MG PO TABS: 40.0000 mg | ORAL_TABLET | Freq: Every day | ORAL | 6 refills | Status: DC

## 2018-10-21 MED FILL — VICTOZA 18 MG/3 ML INJECT P: 18 | 30 days supply | Qty: 9 | Fill #0

## 2018-10-21 MED FILL — SPIRONOLACTONE 25 MG TABLET: 25 | 30 days supply | Qty: 45 | Fill #0

## 2018-10-21 NOTE — Patient Instructions (Signed)
Increase Lantus to 70 units daily. Increase carvedilol to 1-1/2 tablets twice a day. You have been referred to physical therapy as discussed today.

## 2018-10-21 NOTE — Progress Notes (Signed)
[name], female    DOB: 11/14/1950  MRN: [id number]  CC: Diabetes and Hypertension   Subjective: Anita James is a 68 y.o. female who presents for chronic ds management. Her concerns today include:  Patient with history of diabetes type 2 with peripheral neuropathy, hypertension, hypothyroidism, obstructive sleep apnea on CPAP, morbid obesity, diastolic CHF with EF of 30-94%.Urinary incontinence.  DM: Patient had seen Dr. Loanne Drilling last year but due to her insurance she has to change to a [facility] provider.  Patient states that she has been called and is awaiting an appointment from them. Checks BS BID.  Gives blood sugar range of 120-200s before breakfast  Had a cheese sandwich this morning before coming in.  Has cut out a lot of starches. Drinks water and unsweet tea.  One diet soda a day.  Tries to get in fruits sometimes fresh and sometimes in the can  Med:  Lantus 65, Humalog 60 (Has Novolog still which she is using up first before changing to Humalog which is covered by her insurance) Exercise: walking 3-4 times a wk in her neighborhood.  She feels winded after going upstairs.  She was referred for some physical therapy due to deconditioning on last visit.  They had called her from [place] but patient was unable to accept at that time due to transportation issues.  She uses medical transportation and has to give them advance notice.  She would like for me to resubmit the referral for some physical therapy.  HTN/CHF:  Takes Torsemide PRN if swelling in legs.  On average she takes it about 3 times a week.  Reports compliance with her other medications including carvedilol and Spironolactone.  She sleeps on 2-3 pillows.  No PND.  She is using her CPAP consistently.   Had EDG several wks ago.  Revealed several benign fundic gland polyps.  Told by gastroenterology that this is likely due to her being on an acid suppressor.  She is due for a colonoscopy but does not want  to have it done.  She reports that the last one she had made her have diarrhea for several weeks after.   She wanted me to take a look at a spot below the left shoulder blade that has been irritated.  She cannot see it but scratches it.    Patient Active Problem List   Diagnosis Date Noted  . OSA on CPAP 05/19/2017  . Morbid obesity due to excess calories ([place]) 03/17/2017  . Neuropathic pain 11/04/2016  . Carpal tunnel syndrome 06/14/2014  . 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 Cervical radiculopathy at C8 06/05/2014  . Hyperlipemia 09/12/2013  . Diastolic CHF ([place]) 07/68/0881  . Major depression 09/12/2013  . Allergic rhinitis 06/22/2013  . Chronic pain syndrome 05/31/2013  . Gastroesophageal reflux disease without esophagitis 08/30/2012  . Essential hypertension   . Hypothyroidism   . Type 2 diabetes, uncontrolled, with neuropathy ([place])      Current Outpatient Medications on File Prior to Visit  Medication Sig Dispense Refill  . ACCU-CHEK SOFTCLIX LANCETS lancets USE AS DIRECTED TO TEST TWICE DAILY 100 each 12  . acetaminophen (TYLENOL) 500 MG tablet Take 1 tablet (500 mg total) by mouth every 6 (six) hours as needed. 30 tablet 0  . aspirin EC 81 MG tablet Take 81 mg by mouth daily.    . Blood Glucose Monitoring Suppl (ACCU-CHEK AVIVA PLUS) w/Device KIT USE AS DIRECTED TO TEST TWICE DAILY 1 kit 0  .  gabapentin (NEURONTIN) 300 MG capsule TAKE 2 CAPSULES BY MOUTH 2 TIMES DAILY. 120 capsule 2  . glucose blood (ACCU-CHEK AVIVA PLUS) test strip USE AS DIRECTED TO TEST TWICE DAILY 100 each 12  . Insulin Pen Needle (TRUEPLUS PEN NEEDLES) 32G X 4 MM MISC Use to inject insulin. 100 each 11  . levothyroxine (SYNTHROID) 100 MCG tablet Take 1 tablet (100 mcg total) by mouth daily before breakfast. 30 tablet 6  . mometasone (NASONEX) 50 MCG/ACT nasal spray Place 2 sprays into the nose daily. (Patient taking differently: Place 2 sprays into the nose daily as needed (allergies). ) 17 g 3  .  Multiple Vitamins-Minerals (EYE VITAMINS) CAPS Take 1 capsule by mouth daily.    . NYSTATIN powder APPLY TOPICALLY 4 TIMES DAILY. (Patient taking differently: Apply 1 Bottle topically 4 (four) times daily as needed (rash). ) 15 g 0  . ondansetron (ZOFRAN) 4 MG tablet Take 1 tablet (4 mg total) by mouth every 6 (six) hours. 12 tablet 0  . torsemide (DEMADEX) 20 MG tablet Take 1 tablet by mouth daily as needed (fluid).   11   No current facility-administered medications on file prior to visit.     Allergies  Allergen Reactions  . Amoxicillin-Pot Clavulanate Nausea And Vomiting and Other (See Comments)    Combination of medications tear lining of stomach  . Hydrocodone-Acetaminophen Shortness Of Breath  . Oxycodone Shortness Of Breath    oxycontin too  . Sulfa Antibiotics Shortness Of Breath  . Norvasc [Amlodipine Besylate] Swelling    feet  . Sulfamethoxazole Nausea Only  . Tramadol Other (See Comments)    Unknown, toxicity. Patient advised she had to be given Narcan with this    Social History   Socioeconomic History  . Marital status: Divorced    Spouse name: Not on file  . Number of children: 6  . Years of education: BA  . Highest education level: Not on file  Occupational History  . Occupation: unemployed  Social Needs  . Financial resource strain: Not on file  . Food insecurity:    Worry: Not on file    Inability: Not on file  . Transportation needs:    Medical: Not on file    Non-medical: Not on file  Tobacco Use  . Smoking status: Never Smoker  . Smokeless tobacco: Never Used  Substance and Sexual Activity  . Alcohol use: No  . Drug use: No  . Sexual activity: Never  Lifestyle  . Physical activity:    Days per week: Not on file    Minutes per session: Not on file  . Stress: Not on file  Relationships  . Social connections:    Talks on phone: Not on file    Gets together: Not on file    Attends religious service: Not on file    Active member of club or  organization: Not on file    Attends meetings of clubs or organizations: Not on file    Relationship status: Not on file  . Intimate partner violence:    Fear of current or ex partner: Not on file    Emotionally abused: Not on file    Physically abused: Not on file    Forced sexual activity: Not on file  Other Topics Concern  . Not on file  Social History Narrative  . Not on file    Family History  Problem Relation Age of Onset  . Cancer Mother  cervical- mets  . Cancer Father   . Heart disease Father   . Diabetes Father   . Diabetes Sister   . Heart disease Sister   . Heart disease Sister   . COPD Sister   . Colon cancer Neg Hx   . Esophageal cancer Neg Hx   . Rectal cancer Neg Hx   . Stomach cancer Neg Hx     Past Surgical History:  Procedure Laterality Date  . CESAREAN SECTION    . KNEE SURGERY Left   . THYROID SURGERY     goiter    ROS: Review of Systems Negative except as above. PHYSICAL EXAM: BP 139/76   Pulse 85   Temp 98 F (36.7 C) (Oral)   Resp 16   Wt (!) 309 lb 9.6 oz (140.4 kg)   SpO2 97%   BMI 49.97 kg/m   Wt Readings from Last 3 Encounters:  10/21/18 (!) 309 lb 9.6 oz (140.4 kg)  10/06/18 (!) 306 lb (138.8 kg)  09/29/18 (!) 306 lb (138.8 kg)    Physical Exam  General appearance - alert, well appearing, obese Caucasian female and in no distress Mouth - mucous membranes moist, pharynx normal without lesions Neck - supple, no significant adenopathy Chest - clear to auscultation, no wheezes, rales or rhonchi, symmetric air entry Heart - normal rate, regular rhythm, normal S1, S2, no murmurs, rubs, clicks or gallops Extremities -trace lower extremity edema  Skin -patient with freckles on the upper posterior thorax.  I did not see any suspicious lesion at the area where she was scratching but did see scratch marks.   Results for orders placed or performed in visit on 10/21/18  POCT glucose (manual entry)  Result Value Ref Range    POC Glucose 300 (A) 70 - 99 mg/dl  POCT glycosylated hemoglobin (Hb A1C)  Result Value Ref Range   Hemoglobin A1C     HbA1c POC (<> result, manual entry)     HbA1c, POC (prediabetic range)     HbA1c, POC (controlled diabetic range) 7.8 (A) 0.0 - 7.0 %    ASSESSMENT AND PLAN: 1. Type 2 diabetes mellitus, uncontrolled, with neuropathy (HCC) Discussed and encourage healthy eating habits.  Commended her on trying to get in some exercise activity.  We will resubmit referral for physical therapy. Increase Lantus to 70 units daily. Keep appointment with endocrinology when she has an appointment. - POCT glucose (manual entry) - POCT glycosylated hemoglobin (Hb A1C) - sitaGLIPtin (JANUVIA) 50 MG tablet; TAKE 1 TABLET (50 MG TOTAL) BY MOUTH DAILY.  Dispense: 30 tablet; Refill: 6 - liraglutide (VICTOZA) 18 MG/3ML SOPN; 1.8 mg daily  Dispense: 9 mL; Refill: 3 - Insulin Glargine (LANTUS SOLOSTAR) 100 UNIT/ML Solostar Pen; Inject 70 Units into the skin at bedtime.  Dispense: 10 pen; Refill: 11  2. Essential hypertension Not at goal.  Increase carvedilol to 12.5 mg 1-1/2 tablets twice a day. - lisinopril (PRINIVIL,ZESTRIL) 40 MG tablet; Take 1 tablet (40 mg total) by mouth daily.  Dispense: 90 tablet; Refill: 3  3. Class 3 severe obesity due to excess calories with serious comorbidity and body mass index (BMI) of 45.0 to 49.9 in adult [facility]) See #1 above.  4. Hyperlipidemia, unspecified hyperlipidemia type - atorvastatin (LIPITOR) 40 MG tablet; Take 1 tablet (40 mg total) by mouth daily. TAKE 1 TABLET BY MOUTH DAILY AT 6 PM.  Dispense: 30 tablet; Refill: 6  5. Chronic diastolic congestive heart failure (HCC) Stable.  Patient to continue torsemide  as needed  6. Gastroesophageal reflux disease without esophagitis - dexlansoprazole (DEXILANT) 60 MG capsule; Take 1 capsule (60 mg total) by mouth daily.  Dispense: 90 capsule; Refill: 2  7. Physical deconditioning - Ambulatory referral to Physical  Therapy  8. Colon cancer screening Patient not wanting to do colonoscopy but is willing to do the fit test. - Fecal occult blood, imunochemical([facility])  In regards to the area on her back, reassurance given as I did not see a lesion there. Patient was given the opportunity to ask questions.  Patient verbalized understanding of the plan and was able to repeat key elements of the plan.   Orders Placed This Encounter  Procedures  . Fecal occult blood, imunochemical([facility])  . Ambulatory referral to Physical Therapy  . POCT glucose (manual entry)  . POCT glycosylated hemoglobin (Hb A1C)     Requested Prescriptions   Signed Prescriptions Disp Refills  . dexlansoprazole (DEXILANT) 60 MG capsule 90 capsule 2    Sig: Take 1 capsule (60 mg total) by mouth daily.  . Cetirizine HCl 10 MG CAPS 30 capsule 0    Sig: Take 1 capsule (10 mg total) by mouth daily.  . fluticasone (FLONASE) 50 MCG/ACT nasal spray 16 g 2    Sig: Place 2 sprays into both nostrils daily as needed for allergies.  Marland Kitchen insulin lispro (HUMALOG KWIKPEN) 100 UNIT/ML KwikPen 45 mL 6    Sig: Inject 0.5 mLs (50 Units total) into the skin 3 (three) times daily.  . sitaGLIPtin (JANUVIA) 50 MG tablet 30 tablet 6    Sig: TAKE 1 TABLET (50 MG TOTAL) BY MOUTH DAILY.  Marland Kitchen atorvastatin (LIPITOR) 40 MG tablet 30 tablet 6    Sig: Take 1 tablet (40 mg total) by mouth daily. TAKE 1 TABLET BY MOUTH DAILY AT 6 PM.  . liraglutide (VICTOZA) 18 MG/3ML SOPN 9 mL 3    Sig: 1.8 mg daily  . lisinopril (PRINIVIL,ZESTRIL) 40 MG tablet 90 tablet 3    Sig: Take 1 tablet (40 mg total) by mouth daily.  Marland Kitchen spironolactone (ALDACTONE) 25 MG tablet 135 tablet 3    Sig: Take 25 mg, one tablet, in the morning and then 12.5 mg, half a tablet, in the afternoon.  . carvedilol (COREG) 12.5 MG tablet 270 tablet 3    Sig: Take 1.5 tablets (18.75 mg total) by mouth 2 (two) times daily with a meal.  . Insulin Glargine (LANTUS SOLOSTAR) 100 UNIT/ML  Solostar Pen 10 pen 11    Sig: Inject 70 Units into the skin at bedtime.  . Ergocalciferol (VITAMIN D2) 10 MCG (400 UNIT) TABS 90 tablet 0    Sig: Take 400 Units by mouth daily.    Return in about 3 months (around 01/19/2019).  Karle Plumber, MD, FACP

## 2018-11-08 MED FILL — TORSEMIDE 20 MG TABLET: 20 | 30 days supply | Qty: 30 | Fill #6

## 2018-11-08 MED FILL — LISINOPRIL 40 MG TABLET: 40 | 30 days supply | Qty: 30 | Fill #8

## 2018-11-08 MED FILL — JANUVIA 50 MG TABLET: 50 | 30 days supply | Qty: 30 | Fill #0

## 2018-11-08 MED FILL — SYNTHROID 100 MCG TABLET: 100 | 30 days supply | Qty: 30 | Fill #4

## 2018-11-08 MED FILL — CARVEDILOL 12.5 MG TABLET: 12.5 | 30 days supply | Qty: 90 | Fill #0

## 2018-11-08 MED FILL — GABAPENTIN 300 MG CAPSULE: 300 | 30 days supply | Qty: 120 | Fill #1

## 2018-11-08 MED FILL — ATORVASTATIN 40 MG TABLET: 40 | 30 days supply | Qty: 30 | Fill #0

## 2018-11-08 MED FILL — HUMALOG 100 UNITS/ML KWIKPE: 100 | 30 days supply | Qty: 45 | Fill #1

## 2018-11-08 MED FILL — FLUTICASONE PROP 50 MCG SPR: 50 | 30 days supply | Qty: 16 | Fill #0

## 2018-11-08 MED FILL — LANTUS SOLOSTAR 100 UNITS/M: 100 | 46 days supply | Qty: 30 | Fill #6

## 2018-12-02 MED FILL — JANUVIA 50 MG TABLET: 50 | 90 days supply | Qty: 90 | Fill #1

## 2018-12-02 MED FILL — LISINOPRIL 40 MG TABLET: 40 | 90 days supply | Qty: 90 | Fill #9

## 2018-12-02 MED FILL — DEXILANT DR 60 MG CAPSULE: 60 | 90 days supply | Qty: 90 | Fill #0

## 2018-12-02 MED FILL — FLUTICASONE PROP 50 MCG SPR: 50 | 30 days supply | Qty: 16 | Fill #1

## 2018-12-02 MED FILL — ATORVASTATIN 40 MG TABLET: 40 | 90 days supply | Qty: 90 | Fill #1

## 2018-12-02 MED FILL — TORSEMIDE 20 MG TABLET: 20 | 90 days supply | Qty: 90 | Fill #7

## 2018-12-02 MED FILL — GABAPENTIN 300 MG CAPSULE: 300 | 30 days supply | Qty: 120 | Fill #2

## 2018-12-07 ENCOUNTER — Other Ambulatory Visit: Payer: Self-pay

## 2018-12-07 DIAGNOSIS — E890 Postprocedural hypothyroidism: Secondary | ICD-10-CM

## 2018-12-08 MED FILL — CARVEDILOL 12.5 MG TABLET: 12.5 | 30 days supply | Qty: 90 | Fill #1

## 2018-12-08 MED FILL — LANTUS SOLOSTAR 100 UNITS/M: 100 | 87 days supply | Qty: 57 | Fill #7

## 2018-12-08 MED FILL — SYNTHROID 100 MCG TABLET: 100 | 45 days supply | Qty: 45 | Fill #5

## 2018-12-08 MED FILL — HUMALOG 100 UNITS/ML KWIKPE: 100 | 30 days supply | Qty: 45 | Fill #2

## 2018-12-30 MED FILL — ACCU-CHEK FASTCLIX LANCETS: 34 days supply | Qty: 102 | Fill #0

## 2018-12-30 MED FILL — DEXAMETHASONE 1 MG TABLET: 1 | 1 days supply | Qty: 1 | Fill #0

## 2018-12-30 MED FILL — ACCU-CHEK GUIDE TEST STRIP: 33 days supply | Qty: 100 | Fill #0

## 2018-12-31 MED FILL — VICTOZA 18 MG/3 ML INJECT P: 18 | 30 days supply | Qty: 9 | Fill #0

## 2019-01-02 MED FILL — CARVEDILOL 12.5 MG TABLET: 12.5 | 90 days supply | Qty: 270 | Fill #2

## 2019-01-04 ENCOUNTER — Telehealth: Payer: Self-pay | Admitting: Internal Medicine

## 2019-01-04 NOTE — Telephone Encounter (Addendum)
Patients call returned.  Patient identified by name and date of birth.  Patient advise, per Doctor Wynetta Emery to increase Lantus from 70 to 76.  Patient advised she would have a telephone encounter tomorrow at 1430.  Patient stated she would need the pharmacy to be advised that her prescription would be increased.  Patient acknowledged understanding of advise.

## 2019-01-04 NOTE — Telephone Encounter (Signed)
Patients call returned.  Patient identified by name and date of birth.  Patient states that she is waking up in the AM with high blood sugars ranging from 250's to 300's.  Patient states this has happened since she switched to Humalog.  Patient endorses morning head aches.   Patient advised that information would be passed on to Dr. Wynetta Emery and patient would be advised.  Patient acknowledged understanding of advise.

## 2019-01-04 NOTE — Telephone Encounter (Signed)
New Message   Pt states her sugar was 327 waking up and she is concerned. Please f/u

## 2019-01-05 ENCOUNTER — Ambulatory Visit: Payer: Medicare Other | Attending: Internal Medicine | Admitting: Internal Medicine

## 2019-01-05 ENCOUNTER — Other Ambulatory Visit: Payer: Self-pay

## 2019-01-05 DIAGNOSIS — Z9989 Dependence on other enabling machines and devices: Secondary | ICD-10-CM

## 2019-01-05 DIAGNOSIS — IMO0002 Reserved for concepts with insufficient information to code with codable children: Secondary | ICD-10-CM

## 2019-01-05 DIAGNOSIS — I10 Essential (primary) hypertension: Secondary | ICD-10-CM

## 2019-01-05 DIAGNOSIS — E1165 Type 2 diabetes mellitus with hyperglycemia: Secondary | ICD-10-CM

## 2019-01-05 DIAGNOSIS — E1140 Type 2 diabetes mellitus with diabetic neuropathy, unspecified: Secondary | ICD-10-CM

## 2019-01-05 DIAGNOSIS — Z6841 Body Mass Index (BMI) 40.0 and over, adult: Secondary | ICD-10-CM

## 2019-01-05 DIAGNOSIS — G4733 Obstructive sleep apnea (adult) (pediatric): Secondary | ICD-10-CM

## 2019-01-05 MED ORDER — INSULIN GLARGINE 100 UNIT/ML SOLOSTAR PEN: 78.0000 [IU] | PEN_INJECTOR | Freq: Every day | SUBCUTANEOUS | 11 refills | Status: DC

## 2019-01-05 NOTE — Progress Notes (Signed)
Virtual Visit via Telephone Note  I connected with Anita James on 01/05/19 at 3:18 p.m by telephone from my office and verified that I am speaking with the correct person using two identifiers.  Pt is at home.  Only the pt and myself participated in this tele-visit.   I discussed the limitations, risks, security and privacy concerns of performing an evaluation and management service by telephone and the availability of in person appointments. I also discussed with the patient that there may be a patient responsible charge related to this service. The patient expressed understanding and agreed to proceed.   History of Present Illness: Patient with history of diabetes type 2with peripheral neuropathy, hypertension, hypothyroidism, obstructive sleep apnea on CPAP, morbid obesity, diastolic CHF with EF of 76-19%.Urinary incontinence.  Last seen 09/2018  DIABETES TYPE 2 Last A1C:   Results for orders placed or performed in visit on 10/21/18  POCT glucose (manual entry)  Result Value Ref Range   POC Glucose 300 (A) 70 - 99 mg/dl  POCT glycosylated hemoglobin (Hb A1C)  Result Value Ref Range   Hemoglobin A1C     HbA1c POC (<> result, manual entry)     HbA1c, POC (prediabetic range)     HbA1c, POC (controlled diabetic range) 7.8 (A) 0.0 - 7.0 %    Recent done by endocrinologist Dr. Tamala Julian on 12/29/2018 was A1C 7.6 Her main concern is recent elevated BS readings in the mornings. Med Adherence:  [x]  Yes    []  No Medication side effects:  []  Yes    [x]  No Home Monitoring?  [x]  Yes 2-3 times a day since seeing the endocrine specialist on 12/29/2018.  Prior to that she was not checking regularly because she did not have a meter.  Dr. Tamala Julian did not make any significant changes to her meds as pt did not have a BS log to help guide changes.  Pt was given a new meter and told to check BS TID.  Plan is to possibly change Victozia to Ozempic and insulin to U500.  Januvia was d/c. Home glucose results range:  327 yesterday a.m, 200s prior 2 days.  BS  This a.m before BF was 212, at 11:30 this a.m BS was 108 this was after breakfast.   Diet Adherence: started cutting out white starches    Exercise: [x]  Yes - walking back and forth to her mail box which is about 200 ft, going up and down the stairs in her house a few times a day.  I had resubmitted the referral on last visit per her request for P.T.  They did contact her and left VM message but looks like pt did not return call.  Pt states transportation is still an issue and she ahs to schedule 5 days out for pick up Hypoglycemic episodes?: []  Yes    []  No Numbness of the feet? [x]  Yes    []  No Retinopathy hx? []  Yes    []  No Last eye exam:  Comments:   HTN: reports compliance with meds and salt restriction.  However, she admits that she does eat canned veggies like green beans  Would like device to check BP at home No CP/LE edema  OSA:  Reports compliance with her CPAP device  Severe HA yesterday that has since resolved.  Observations/Objective: Vitals reviewed from 12/29/2018 visit with the endocrinologist Dr. Tamala Julian:  wgh 311 with BMI 49.48    Chemistry      Component Value Date/Time   NA 138  09/15/2018 1635   NA 142 06/14/2018 1640   NA 138 06/21/2013 0438   K 4.7 09/15/2018 1635   K 3.8 06/21/2013 0438   CL 107 09/15/2018 1635   CL 105 06/21/2013 0438   CO2 22 09/15/2018 1635   CO2 26 06/21/2013 0438   BUN 24 (H) 09/15/2018 1635   BUN 16 06/14/2018 1640   BUN 7 06/21/2013 0438   CREATININE 1.16 (H) 09/15/2018 1635   CREATININE 0.97 11/25/2016 1115      Component Value Date/Time   CALCIUM 8.6 (L) 09/15/2018 1635   CALCIUM 8.9 06/21/2013 0438   ALKPHOS 55 09/15/2018 1635   ALKPHOS 69 06/21/2013 0438   AST 27 09/15/2018 1635   AST 33 06/21/2013 0438   ALT 17 09/15/2018 1635   ALT 29 06/21/2013 0438   BILITOT 0.8 09/15/2018 1635   BILITOT 0.3 04/29/2018 1542   BILITOT 0.5 06/21/2013 0438     Lab Results  Component Value  Date   WBC 6.5 09/15/2018   HGB 11.4 (L) 09/15/2018   HCT 35.6 (L) 09/15/2018   MCV 93.4 09/15/2018   PLT 191 09/15/2018     Assessment and Plan: 1. Type 2 diabetes mellitus, uncontrolled, with neuropathy ([place]) Heathy eating habits discussed.  Commended her on cutting back on white carbs Encourage her to move more.  Get outside and walk Increase Lantus from 70 to 78 units.  Continue to monitor BS and keep f/u appt with endocrine - Insulin Glargine (LANTUS SOLOSTAR) 100 UNIT/ML Solostar Pen; Inject 78 Units into the skin daily.  Dispense: 10 pen; Refill: 11  2. Essential hypertension Rxn sent to her pharmacy for home BP monitoring device.  Advise to check BP 1-2 x a wk with goal being 130/80 or less Advised to eat fresh veggies instead of canned - DME Other see comment  3. Class 3 severe obesity due to excess calories with serious comorbidity and body mass index (BMI) of 45.0 to 49.9 in adult [facility]) See #1 above  4. OSA on CPAP Continue compliance with CPAP   Follow Up Instructions: 2-3 mths   I discussed the assessment and treatment plan with the patient. The patient was provided an opportunity to ask questions and all were answered. The patient agreed with the plan and demonstrated an understanding of the instructions.   The patient was advised to call back or seek an in-person evaluation if the symptoms worsen or if the condition fails to improve as anticipated.  I provided 21 minutes of non-face-to-face time during this encounter.   Karle Plumber, MD

## 2019-01-05 NOTE — Progress Notes (Signed)
Pt states her sugar yesterday morning was 327   Pt states Monday and Tuesday it was 264

## 2019-01-06 ENCOUNTER — Other Ambulatory Visit: Payer: Self-pay

## 2019-01-13 ENCOUNTER — Telehealth: Payer: Self-pay | Admitting: Internal Medicine

## 2019-01-13 NOTE — Telephone Encounter (Signed)
Patient called requesting a prescription for a blood pressure meter to [facility] to [facility], please follow up

## 2019-01-14 MED FILL — HUMALOG 100 UNITS/ML KWIKPE: 100 | 30 days supply | Qty: 45 | Fill #0

## 2019-01-16 ENCOUNTER — Other Ambulatory Visit: Payer: Self-pay | Admitting: Pharmacist

## 2019-01-16 DIAGNOSIS — Z794 Long term (current) use of insulin: Secondary | ICD-10-CM

## 2019-01-16 DIAGNOSIS — E1140 Type 2 diabetes mellitus with diabetic neuropathy, unspecified: Secondary | ICD-10-CM

## 2019-01-16 DIAGNOSIS — E118 Type 2 diabetes mellitus with unspecified complications: Secondary | ICD-10-CM

## 2019-01-16 NOTE — Telephone Encounter (Signed)
Will [facility] to [facility]  Will forward to Opal Sidles about the cpap tubing

## 2019-01-16 NOTE — Telephone Encounter (Signed)
Patient wants the nurse to call her because she needs the tubing for her Cpap and the BP sent to [facility].

## 2019-01-16 NOTE — Telephone Encounter (Signed)
Dr. Wynetta Emery may I get a rx for a cpap tubing for pt to fax

## 2019-01-16 NOTE — Telephone Encounter (Signed)
Received call from patient's pharmacy requesting refill for gabapentin. Pt last filled for 30-day supply on 12/02/18. Will route to PCP for review.

## 2019-01-17 MED ORDER — GABAPENTIN 300 MG PO CAPS: ORAL_CAPSULE | ORAL | 6 refills | Status: DC

## 2019-01-17 MED FILL — LANTUS SOLOSTAR 100 UNITS/M: 100 | 30 days supply | Qty: 24 | Fill #0

## 2019-01-17 MED FILL — GABAPENTIN 300 MG CAPSULE: 300 | 30 days supply | Qty: 120 | Fill #0

## 2019-01-17 MED FILL — SYNTHROID 100 MCG TABLET: 100 | 15 days supply | Qty: 15 | Fill #6

## 2019-01-17 MED FILL — VICTOZA 18 MG/3 ML INJECT P: 18 | 30 days supply | Qty: 9 | Fill #1

## 2019-01-18 ENCOUNTER — Telehealth: Payer: Self-pay | Admitting: Internal Medicine

## 2019-01-18 NOTE — Telephone Encounter (Signed)
shyla from [facility] called in stated she needed a diagnosis that states she has hypertension for them to be able to bill medicaid please follow up

## 2019-01-19 ENCOUNTER — Ambulatory Visit: Payer: Medicare Other | Attending: Internal Medicine | Admitting: Internal Medicine

## 2019-01-19 ENCOUNTER — Other Ambulatory Visit: Payer: Self-pay

## 2019-01-19 DIAGNOSIS — Z6841 Body Mass Index (BMI) 40.0 and over, adult: Secondary | ICD-10-CM

## 2019-01-19 DIAGNOSIS — E1140 Type 2 diabetes mellitus with diabetic neuropathy, unspecified: Secondary | ICD-10-CM

## 2019-01-19 DIAGNOSIS — G4733 Obstructive sleep apnea (adult) (pediatric): Secondary | ICD-10-CM | POA: Diagnosis not present

## 2019-01-19 DIAGNOSIS — E1165 Type 2 diabetes mellitus with hyperglycemia: Secondary | ICD-10-CM

## 2019-01-19 DIAGNOSIS — Z9989 Dependence on other enabling machines and devices: Secondary | ICD-10-CM

## 2019-01-19 DIAGNOSIS — E890 Postprocedural hypothyroidism: Secondary | ICD-10-CM

## 2019-01-19 DIAGNOSIS — I5032 Chronic diastolic (congestive) heart failure: Secondary | ICD-10-CM

## 2019-01-19 DIAGNOSIS — IMO0002 Reserved for concepts with insufficient information to code with codable children: Secondary | ICD-10-CM

## 2019-01-19 MED ORDER — INSULIN GLARGINE 100 UNIT/ML SOLOSTAR PEN: 84.0000 [IU] | PEN_INJECTOR | Freq: Every day | SUBCUTANEOUS | 11 refills | Status: DC

## 2019-01-19 NOTE — Telephone Encounter (Signed)
Returned [place] from [employer] call she was on another call spoke with Hassan Rowan and provided ICD 10 for hypertension I10

## 2019-01-19 NOTE — Progress Notes (Signed)
Virtual Visit via Telephone Note Due to current restrictions/limitations of in-office visits due to the COVID-19 pandemic, this scheduled clinical appointment was converted to a telehealth visit  I connected with Anita James on 01/19/19 at 10:43 a.m EDT by telephone  from my office and verified that I am speaking with the correct person using two identifiers.  Patient is at the nephrologist office to get blood test.  Only the patient and myself participated in this encounter.   I discussed the limitations, risks, security and privacy concerns of performing an evaluation and management service by telephone and the availability of in person appointments. I also discussed with the patient that there may be a patient responsible charge related to this service. The patient expressed understanding and agreed to proceed.   History of Present Illness: Patient with history of diabetes type 2with peripheral neuropathy, hypertension, hypothyroidism, obstructive sleep apnea on CPAP, morbid obesity, diastolic CHF with EF of 72-53%.Urinary incontinence.   DM:  BS no better in a.m since increase dose of Lantus to 78 units.  Gives a.m readings of 233, 175, 194 and 208.  Checks BS sometimes 2 x a day.  No low BS. Eating habits:  Trying to cut back on starches.  Last saw nutritionist over 1 yr ago.   Concern about her wgh.  Desperately wanting to lose wgh and waiting diet pill.  States she gained over 100 lbs since a hospitalization in 6644  HTN/diastolic CHF: C/o ankle edema.  Shortness of breath when she walks which has not changed and which she attributes to being overweight.    States she is not using Torsemide every day because she is concern it may damage kidneys. Last took yesterday  Limits salt States she has bruising around her ankles intermittently which she has had for over 6 months.  States that she had mentioned it in the past to me but at the time it was not too prominent on exam  OSA:  Feels her  CPAP machine not working properly.  Sometimes she has to wake up at nights to put more water in it.  Her current machine was a refurbished 1 that she got through [place].  She has had problems getting supplies for her machine from [place].  She states they told her that because she did not get her machine from them they cannot provide her with supplies and that she would need a new machine. -She is requesting a prescription for a new machine.  However on review of her chart I see that her last sleep study was done in the fall 2019.  Reading was that she does not have sleep apnea.  Patient states she was told by the tech who did the study that she was coughing a lot through the night and that the study was not that accurate.  Observations/Objective: Pt reports wgh today was 309 lbs  Assessment and Plan: 1. Type 2 diabetes mellitus, uncontrolled, with neuropathy ([place]) Encourage healthy eating habits.  Advised to increase the Lantus insulin to 84 units.  Advised that she may want to call the endocrinologist and try to move up her appointment which is currently scheduled for June.  Given that she is on such a high dose of Lantus, she probably would benefit from being on the insulin U500 as he had mentioned in his note when he saw her earlier this month. -- Insulin Glargine (LANTUS SOLOSTAR) 100 UNIT/ML Solostar Pen; Inject 84 Units into the skin daily.  Dispense:  10 pen; Refill: 11  2. Chronic diastolic congestive heart failure ([place]) Advised patient to take the torsemide for at least 2 to 3 days when she notices ankle edema or has weight increase with ankle edema. She has an appointment next week with a kidney specialist had blood tests drawn today through their office. Encouraged her to continue to limit salt in the foods  3. Class 3 severe obesity due to excess calories with serious comorbidity and body mass index (BMI) of 45.0 to 49.9 in adult [facility]) Not a good candidate for phentermine or other  diet supplement.  Will refer her to medical weight management program - [facility] Ref to Medical Weight Management  4. OSA on CPAP I went over the results of the last sleep study that she had done in the fall of last year.  The sleep specialist determined that she did not have sleep apnea so I think if we give a prescription for a new CPAP machine it would probably not be approved by her insurance.  However patient feels that she still does have sleep apnea and that the sleep study result was inaccurate.  I have therefore recommended a referral to the sleep specialist who read the sleep study for clarification - Ambulatory referral to Pulmonology  5. Postoperative hypothyroidism I forgot to inform patient to come to the lab to have thyroid level checked as the last one was in August of last year.  We will have my CMA call her - TSH; Future   Follow Up Instructions: Follow-up again in 2 to 3 months   I discussed the assessment and treatment plan with the patient. The patient was provided an opportunity to ask questions and all were answered. The patient agreed with the plan and demonstrated an understanding of the instructions.   The patient was advised to call back or seek an in-person evaluation if the symptoms worsen or if the condition fails to improve as anticipated.  I provided 25 minutes of non-face-to-face time during this encounter.   Karle Plumber, MD

## 2019-01-19 NOTE — Progress Notes (Signed)
Pt states her legs and ankles are still swollen

## 2019-01-23 MED FILL — CIPROFLOXACIN HCL 250 MG TA: 250 | 5 days supply | Qty: 5 | Fill #0

## 2019-01-24 MED FILL — HUMALOG 100 UNITS/ML KWIKPE: 100 | 30 days supply | Qty: 45 | Fill #0

## 2019-01-24 MED FILL — LANTUS SOLOSTAR 100 UNITS/M: 100 | 35 days supply | Qty: 30 | Fill #0

## 2019-01-24 MED FILL — ACCU-CHEK FASTCLIX LANCETS: 34 days supply | Qty: 102 | Fill #1

## 2019-01-24 MED FILL — ACCU-CHEK GUIDE TEST STRIP: 33 days supply | Qty: 100 | Fill #1

## 2019-01-25 ENCOUNTER — Telehealth: Payer: Self-pay | Admitting: Internal Medicine

## 2019-01-25 NOTE — Telephone Encounter (Signed)
Patient called stating she needs to FU up in regards to the tube and the masks. Patient states that the sleep study office is closed right now and [facility] states a test must be done before being able to be given supplies. [facility] states that [facility] with just a prescription. Please follow up.

## 2019-01-25 NOTE — Telephone Encounter (Signed)
Anita James I have a question. So does this mean I will need to fax everything to [facility] now. If so I will have to have Dr. Wynetta Emery write the rx again

## 2019-02-17 MED FILL — SPIRONOLACTONE 25 MG TABLET: 25 | 30 days supply | Qty: 45 | Fill #0

## 2019-02-22 DIAGNOSIS — R32 Unspecified urinary incontinence: Secondary | ICD-10-CM | POA: Diagnosis not present

## 2019-02-22 DIAGNOSIS — F0390 Unspecified dementia without behavioral disturbance: Secondary | ICD-10-CM | POA: Diagnosis not present

## 2019-02-22 DIAGNOSIS — N3941 Urge incontinence: Secondary | ICD-10-CM | POA: Diagnosis not present

## 2019-02-24 ENCOUNTER — Other Ambulatory Visit: Payer: Self-pay | Admitting: Internal Medicine

## 2019-02-24 DIAGNOSIS — E890 Postprocedural hypothyroidism: Secondary | ICD-10-CM

## 2019-02-27 ENCOUNTER — Emergency Department (HOSPITAL_BASED_OUTPATIENT_CLINIC_OR_DEPARTMENT_OTHER): Payer: Medicare HMO

## 2019-02-27 ENCOUNTER — Emergency Department (HOSPITAL_BASED_OUTPATIENT_CLINIC_OR_DEPARTMENT_OTHER)
Admission: EM | Admit: 2019-02-27 | Discharge: 2019-02-27 | Disposition: A | Discharge status: Home / Self Care | Payer: Medicare HMO | Attending: Emergency Medicine | Admitting: Emergency Medicine

## 2019-02-27 ENCOUNTER — Other Ambulatory Visit: Payer: Self-pay | Admitting: Internal Medicine

## 2019-02-27 ENCOUNTER — Other Ambulatory Visit: Payer: Self-pay

## 2019-02-27 ENCOUNTER — Encounter (HOSPITAL_BASED_OUTPATIENT_CLINIC_OR_DEPARTMENT_OTHER): Payer: Self-pay

## 2019-02-27 DIAGNOSIS — E1140 Type 2 diabetes mellitus with diabetic neuropathy, unspecified: Secondary | ICD-10-CM | POA: Insufficient documentation

## 2019-02-27 DIAGNOSIS — Z794 Long term (current) use of insulin: Secondary | ICD-10-CM | POA: Insufficient documentation

## 2019-02-27 DIAGNOSIS — I130 Hypertensive heart and chronic kidney disease with heart failure and stage 1 through stage 4 chronic kidney disease, or unspecified chronic kidney disease: Secondary | ICD-10-CM | POA: Diagnosis not present

## 2019-02-27 DIAGNOSIS — R079 Chest pain, unspecified: Secondary | ICD-10-CM | POA: Diagnosis not present

## 2019-02-27 DIAGNOSIS — S50312A Abrasion of left elbow, initial encounter: Secondary | ICD-10-CM | POA: Diagnosis not present

## 2019-02-27 DIAGNOSIS — Z7982 Long term (current) use of aspirin: Secondary | ICD-10-CM | POA: Insufficient documentation

## 2019-02-27 DIAGNOSIS — N189 Chronic kidney disease, unspecified: Secondary | ICD-10-CM | POA: Insufficient documentation

## 2019-02-27 DIAGNOSIS — W19XXXA Unspecified fall, initial encounter: Secondary | ICD-10-CM | POA: Insufficient documentation

## 2019-02-27 DIAGNOSIS — E039 Hypothyroidism, unspecified: Secondary | ICD-10-CM | POA: Insufficient documentation

## 2019-02-27 DIAGNOSIS — I5032 Chronic diastolic (congestive) heart failure: Secondary | ICD-10-CM | POA: Diagnosis not present

## 2019-02-27 DIAGNOSIS — E11649 Type 2 diabetes mellitus with hypoglycemia without coma: Secondary | ICD-10-CM | POA: Insufficient documentation

## 2019-02-27 DIAGNOSIS — E890 Postprocedural hypothyroidism: Secondary | ICD-10-CM

## 2019-02-27 DIAGNOSIS — R0789 Other chest pain: Secondary | ICD-10-CM | POA: Diagnosis not present

## 2019-02-27 DIAGNOSIS — E162 Hypoglycemia, unspecified: Secondary | ICD-10-CM

## 2019-02-27 DIAGNOSIS — Z79899 Other long term (current) drug therapy: Secondary | ICD-10-CM | POA: Insufficient documentation

## 2019-02-27 DIAGNOSIS — E1122 Type 2 diabetes mellitus with diabetic chronic kidney disease: Secondary | ICD-10-CM | POA: Diagnosis not present

## 2019-02-27 LAB — TROPONIN I: Troponin I: <0.03 ng/mL (ref <0.03)

## 2019-02-27 LAB — BASIC METABOLIC PANEL
Anion gap: 8 (ref 5–15)
BUN: 21 mg/dL (ref 8–23)
CO2: 25 mmol/L (ref 22–32)
Calcium: 9 mg/dL (ref 8.9–10.3)
Chloride: 106 mmol/L (ref 98–111)
Creatinine, Ser: 0.99 mg/dL (ref 0.44–1.00)
GFR calc Af Amer: >60 mL/min (ref >60)
GFR calc non Af Amer: 60 mL/min — ABNORMAL LOW (ref >60)
Glucose, Bld: 85 mg/dL (ref 70–99)
Potassium: 4.1 mmol/L (ref 3.5–5.1)
Sodium: 139 mmol/L (ref 135–145)

## 2019-02-27 LAB — CBC WITH DIFFERENTIAL/PLATELET
Abs Immature Granulocytes: 0.02 10*3/uL (ref 0.00–0.07)
Basophils Absolute: 0 10*3/uL (ref 0.0–0.1)
Basophils Relative: 0 %
Eosinophils Absolute: 0.1 10*3/uL (ref 0.0–0.5)
Eosinophils Relative: 1 %
HCT: 35.3 % — ABNORMAL LOW (ref 36.0–46.0)
Hemoglobin: 11.6 g/dL — ABNORMAL LOW (ref 12.0–15.0)
Immature Granulocytes: 0 %
Lymphocytes Relative: 24 %
Lymphs Abs: 1.7 10*3/uL (ref 0.7–4.0)
MCH: 30.4 pg (ref 26.0–34.0)
MCHC: 32.9 g/dL (ref 30.0–36.0)
MCV: 92.7 fL (ref 80.0–100.0)
Monocytes Absolute: 0.7 10*3/uL (ref 0.1–1.0)
Monocytes Relative: 10 %
Neutro Abs: 4.7 10*3/uL (ref 1.7–7.7)
Neutrophils Relative %: 65 %
Platelets: 222 10*3/uL (ref 150–400)
RBC: 3.81 MIL/uL — ABNORMAL LOW (ref 3.87–5.11)
RDW: 13.2 % (ref 11.5–15.5)
WBC: 7.3 10*3/uL (ref 4.0–10.5)
nRBC: 0 % (ref 0.0–0.2)

## 2019-02-27 LAB — CBG MONITORING, ED
Glucose-Capillary: 41 mg/dL — CL (ref 70–99)
Glucose-Capillary: 55 mg/dL — ABNORMAL LOW (ref 70–99)
Glucose-Capillary: 70 mg/dL (ref 70–99)
Glucose-Capillary: 107 mg/dL — ABNORMAL HIGH (ref 70–99)

## 2019-02-27 MED ORDER — DICLOFENAC SODIUM 1 % TD GEL: 4.0000 g | Freq: Four times a day (QID) | TRANSDERMAL | 0 refills | Status: AC

## 2019-02-27 NOTE — ED Triage Notes (Signed)
C/o CP and right clavicle pain started today-also c/o pain to left rib area-pt states she fell 12 days ago and is unsure if pain related to fall-NAD-steady gait

## 2019-02-27 NOTE — ED Notes (Signed)
Pt on the way back to her room from the bathroom, made it to the bathroom door stated she felt like her sugar was crashing, I notified RN. Assisted the pt back to the room with wheelchair. Pt's CBG 41 Notified RN, Per Agricultural consultant  Gave two apple juices.

## 2019-02-27 NOTE — ED Provider Notes (Signed)
[facility] EMERGENCY DEPARTMENT Provider Note   CSN: [id number] Arrival date & time: 02/27/19  1631    History   Chief Complaint Chief Complaint  Patient presents with   Chest Pain    HPI Anita James is a 68 y.o. female.     68 yo F with a chief complaint of chest pain.  This been going on for the past couple days.  Patient had a fall a few days ago and the pain started the day afterwards.  Pain is worse with movement palpation twisting.  She feels that she is having some trouble taking a deep breath.  Pain is to her left lateral ribs as well as her right clavicular area.  She did bump her left elbow.  Had a small wound to the area that she has applied a Band-Aid.  No difficulty moving the arm.  Patient is concerned she might be having a heart attack as she feels that her symptoms are worse when she get up to move around.  The history is provided by the patient.  Chest Pain  Pain location:  Substernal area, L chest and R chest Pain quality: aching and sharp   Pain radiates to:  Does not radiate Pain severity:  Moderate Onset quality:  Gradual Duration:  2 days Timing:  Constant Progression:  Worsening Chronicity:  New Context: breathing, lifting, movement and raising an arm   Relieved by:  Nothing Worsened by:  Certain positions Ineffective treatments:  None tried Associated symptoms: no dizziness, no fever, no headache, no nausea, no palpitations, no shortness of breath and no vomiting     Past Medical History:  Diagnosis Date   Anxiety    Carpal tunnel syndrome 06/14/2014   Chest pain    CHF (congestive heart failure) (HCC)    Chronic back pain    Chronic kidney disease 03/2013   failure due to meds   Diabetes mellitus type II, uncontrolled ([place])    Diverticulitis    Frequent headaches    Hiatal hernia    Hyperlipemia    Hypertension    Hypothyroidism    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   Neuropathy    Obesity    Pneumonia     Vitamin D deficiency 06/05/2014    Patient Active Problem List   Diagnosis Date Noted   OSA on CPAP 05/19/2017   Morbid obesity due to excess calories ([place]) 03/17/2017   Neuropathic pain 11/04/2016   Carpal tunnel syndrome 06/14/2014   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   Cervical radiculopathy at C8 06/05/2014   Hyperlipemia 41/96/2229   Diastolic CHF ([place]) 79/89/2119   Major depression 09/12/2013   Allergic rhinitis 06/22/2013   Chronic pain syndrome 05/31/2013   Gastroesophageal reflux disease without esophagitis 08/30/2012   Essential hypertension    Hypothyroidism    Type 2 diabetes, uncontrolled, with neuropathy (HCC)     Past Surgical History:  Procedure Laterality Date   CESAREAN SECTION     KNEE SURGERY Left    THYROID SURGERY     goiter     OB History    Gravida  4   Para  3   Term  2   Preterm  1   AB  1   Living  6     SAB      TAB      Ectopic  1   Multiple  2   Live Births  5  Home Medications    Prior to Admission medications   Medication Sig Start Date End Date Taking? Authorizing Provider  ACCU-CHEK SOFTCLIX LANCETS lancets USE AS DIRECTED TO TEST TWICE DAILY 04/27/18   Ladell Pier, MD  acetaminophen (TYLENOL) 500 MG tablet Take 1 tablet (500 mg total) by mouth every 6 (six) hours as needed. 09/16/18   Darlin Drop P, PA-C  aspirin EC 81 MG tablet Take 81 mg by mouth daily. 11/20/16   [provider]  atorvastatin (LIPITOR) 40 MG tablet Take 1 tablet (40 mg total) by mouth daily. TAKE 1 TABLET BY MOUTH DAILY AT 6 PM. 10/21/18   Ladell Pier, MD  Blood Glucose Monitoring Suppl (ACCU-CHEK AVIVA PLUS) w/Device KIT USE AS DIRECTED TO TEST TWICE DAILY 04/27/18   Ladell Pier, MD  carvedilol (COREG) 12.5 MG tablet Take 1.5 tablets (18.75 mg total) by mouth 2 (two) times daily with a meal. 10/21/18   Ladell Pier, MD  Cetirizine HCl 10 MG CAPS Take 1 capsule (10 mg total) by mouth  daily. 10/21/18   Ladell Pier, MD  dexlansoprazole (DEXILANT) 60 MG capsule Take 1 capsule (60 mg total) by mouth daily. 10/21/18   Ladell Pier, MD  diclofenac sodium (VOLTAREN) 1 % GEL Apply 4 g topically 4 (four) times daily. 02/27/19   Deno Etienne, DO  Ergocalciferol (VITAMIN D2) 10 MCG (400 UNIT) TABS Take 400 Units by mouth daily. 10/21/18   Ladell Pier, MD  fluticasone (FLONASE) 50 MCG/ACT nasal spray Place 2 sprays into both nostrils daily as needed for allergies. 10/21/18   Ladell Pier, MD  gabapentin (NEURONTIN) 300 MG capsule TAKE 2 CAPSULES BY MOUTH 2 TIMES DAILY. 01/17/19   Ladell Pier, MD  glucose blood (ACCU-CHEK AVIVA PLUS) test strip USE AS DIRECTED TO TEST TWICE DAILY 04/27/18   Ladell Pier, MD  Insulin Glargine (LANTUS SOLOSTAR) 100 UNIT/ML Solostar Pen Inject 84 Units into the skin daily. 01/19/19   Ladell Pier, MD  insulin lispro (HUMALOG KWIKPEN) 100 UNIT/ML KwikPen Inject 0.5 mLs (50 Units total) into the skin 3 (three) times daily. 10/21/18   Ladell Pier, MD  Insulin Pen Needle (TRUEPLUS PEN NEEDLES) 32G X 4 MM MISC Use to inject insulin. 10/13/18   Ladell Pier, MD  levothyroxine (SYNTHROID) 100 MCG tablet Take 1 tablet (100 mcg total) by mouth daily before breakfast. Must have labs drawn for future refills. 02/24/19   Ladell Pier, MD  liraglutide (VICTOZA) 18 MG/3ML SOPN 1.8 mg daily 10/21/18   Ladell Pier, MD  lisinopril (PRINIVIL,ZESTRIL) 40 MG tablet Take 1 tablet (40 mg total) by mouth daily. 10/21/18   Ladell Pier, MD  mometasone (NASONEX) 50 MCG/ACT nasal spray Place 2 sprays into the nose daily. Patient taking differently: Place 2 sprays into the nose daily as needed (allergies).  08/24/17   Ladell Pier, MD  Multiple Vitamins-Minerals (EYE VITAMINS) CAPS Take 1 capsule by mouth daily. 08/11/12   Thurnell Lose, MD  NYSTATIN powder APPLY TOPICALLY 4 TIMES DAILY. Patient taking differently: Apply  1 Bottle topically 4 (four) times daily as needed (rash).  01/18/17   Langeland, Leda Quail, MD  ondansetron (ZOFRAN) 4 MG tablet Take 1 tablet (4 mg total) by mouth every 6 (six) hours. 09/16/18   Jerilee Hoh, Ana P, PA-C  sitaGLIPtin (JANUVIA) 50 MG tablet TAKE 1 TABLET (50 MG TOTAL) BY MOUTH DAILY. 10/21/18   Ladell Pier, MD  spironolactone (  ALDACTONE) 25 MG tablet Take 25 mg, one tablet, in the morning and then 12.5 mg, half a tablet, in the afternoon. 10/21/18   Ladell Pier, MD  torsemide (DEMADEX) 20 MG tablet Take 1 tablet by mouth daily as needed (fluid).  05/09/18   [provider]    Family History Family History  Problem Relation Age of Onset   Cancer Mother        cervical- mets   Cancer Father    Heart disease Father    Diabetes Father    Diabetes Sister    Heart disease Sister    Heart disease Sister    COPD Sister    Colon cancer Neg Hx    Esophageal cancer Neg Hx    Rectal cancer Neg Hx    Stomach cancer Neg Hx     Social History Social History   Tobacco Use   Smoking status: Never Smoker   Smokeless tobacco: Never Used  Substance Use Topics   Alcohol use: No   Drug use: No     Allergies   Amoxicillin-pot clavulanate; Hydrocodone-acetaminophen; Oxycodone; Sulfa antibiotics; Norvasc [amlodipine besylate]; Sulfamethoxazole; and Tramadol   Review of Systems Review of Systems  Constitutional: Negative for chills and fever.  HENT: Negative for congestion and rhinorrhea.   Eyes: Negative for redness and visual disturbance.  Respiratory: Negative for shortness of breath and wheezing.   Cardiovascular: Positive for chest pain. Negative for palpitations.  Gastrointestinal: Negative for nausea and vomiting.  Genitourinary: Negative for dysuria and urgency.  Musculoskeletal: Negative for arthralgias and myalgias.  Skin: Negative for pallor and wound.  Neurological: Negative for dizziness and headaches.     Physical  Exam Updated Vital Signs BP (!) 156/80 (BP Location: Right Arm)    Pulse 88    Temp 98.3 F (36.8 C) (Oral)    Resp (!) 22    SpO2 96%   Physical Exam Vitals signs and nursing note reviewed.  Constitutional:      General: She is not in acute distress.    Appearance: She is well-developed. She is not diaphoretic.  HENT:     Head: Normocephalic and atraumatic.  Eyes:     Pupils: Pupils are equal, round, and reactive to light.  Neck:     Musculoskeletal: Normal range of motion and neck supple.  Cardiovascular:     Rate and Rhythm: Normal rate and regular rhythm.     Heart sounds: No murmur. No friction rub. No gallop.   Pulmonary:     Effort: Pulmonary effort is normal.     Breath sounds: No wheezing or rales.  Chest:     Chest wall: Tenderness present.     Comments: Tenderness with palpation to the left lateral rib margin in the right clavicular area.  Mild sternal tenderness. Abdominal:     General: There is no distension.     Palpations: Abdomen is soft.     Tenderness: There is no abdominal tenderness.  Musculoskeletal:        General: No tenderness.     Comments: Superficial abrasion to the left elbow.  Full range of motion of the left elbow.  No bony tenderness.  Skin:    General: Skin is warm and dry.  Neurological:     Mental Status: She is alert and oriented to person, place, and time.  Psychiatric:        Behavior: Behavior normal.      ED Treatments / Results  Labs (all labs  ordered are listed, but only abnormal results are displayed) Labs Reviewed  CBC WITH DIFFERENTIAL/PLATELET - Abnormal; Notable for the following components:      Result Value   RBC 3.81 (*)    Hemoglobin 11.6 (*)    HCT 35.3 (*)    All other components within normal limits  BASIC METABOLIC PANEL - Abnormal; Notable for the following components:   GFR calc non Af Amer 60 (*)    All other components within normal limits  CBG MONITORING, ED - Abnormal; Notable for the following  components:   Glucose-Capillary 41 (*)    All other components within normal limits  CBG MONITORING, ED - Abnormal; Notable for the following components:   Glucose-Capillary 55 (*)    All other components within normal limits  CBG MONITORING, ED - Abnormal; Notable for the following components:   Glucose-Capillary 107 (*)    All other components within normal limits  TROPONIN I  CBG MONITORING, ED    EKG EKG Interpretation  Date/Time:  Monday February 27 2019 16:41:17 EDT Ventricular Rate:  94 PR Interval:  196 QRS Duration: 88 QT Interval:  360 QTC Calculation: 450 R Axis:   -5 Text Interpretation:  Normal sinus rhythm Cannot rule out Anterior infarct , age undetermined Abnormal ECG No significant change since last tracing Confirmed by Deno Etienne [phone]) on 02/27/2019 5:25:16 PM   Radiology Dg Chest 2 View  Result Date: 02/27/2019 CLINICAL DATA:  68 year old female with chest pain after fall. Clavicle pain. EXAM: CHEST - 2 VIEW COMPARISON:  09/16/2017. FINDINGS: Large body habitus. Stable visualized osseous structures. No clavicle fracture identified. Mildly lower lung volumes. Normal cardiac size and mediastinal contours. Visualized tracheal air column is within normal limits. No pneumothorax, pulmonary edema, pleural effusion or confluent pulmonary opacity. Negative visible bowel gas pattern. IMPRESSION: No acute cardiopulmonary abnormality or acute traumatic injury identified. Electronically Signed   By: Genevie Ann M.D.   On: 02/27/2019 20:01    Procedures Procedures (including critical care time)  Medications Ordered in ED Medications - No data to display   Initial Impression / Assessment and Plan / ED Course  I have reviewed the triage vital signs and the nursing notes.  Pertinent labs & imaging results that were available during my care of the patient were reviewed by me and considered in my medical decision making (see chart for details).        68 yo F with a chief  complaint of chest pain after mechanical fall.  Most likely this is musculoskeletal.  Patient is concerned this may be a heart attack.  EKG without concerning finding.  Will obtain a chest x-ray lab work reassess.  Work-up here is unremarkable with the exception of the patient coming back from the bathroom became very weak and disoriented.  Found to have a blood sugar in the 40s.  Improved with oral intake here.  Patient denies any change to her insulin denies any decreased oral intake at home.  I am unsure of the cause of her hypoglycemia.  Pain seems most consistent with chest wall pain after an injury.  Chest x-ray viewed by me without pneumothorax or obvious rib fracture.  We will give a prescription for Voltaren gel.  Have the patient use the incentive spirometer 10 min out of every hour while awake.  8:35 PM:  I have discussed the diagnosis/risks/treatment options with the patient and believe the pt to be eligible for discharge home to follow-up with PCP. We also  discussed returning to the ED immediately if new or worsening sx occur. We discussed the sx which are most concerning (e.g., sudden worsening pain, fever, inability to tolerate by mouth) that necessitate immediate return. Medications administered to the patient during their visit and any new prescriptions provided to the patient are listed below.  Medications given during this visit Medications - No data to display   The patient appears reasonably screen and/or stabilized for discharge and I doubt any other medical condition or other [facility] requiring further screening, evaluation, or treatment in the ED at this time prior to discharge.    Final Clinical Impressions(s) / ED Diagnoses   Final diagnoses:  Chest wall pain  Hypoglycemia    ED Discharge Orders         Ordered    diclofenac sodium (VOLTAREN) 1 % GEL  4 times daily     02/27/19 2033           Deno Etienne, DO 02/27/19 2035

## 2019-02-27 NOTE — Discharge Instructions (Signed)
Take tylenol 1000mg(2 extra strength) four times a day. Use the gel as prescribed.  Return for fever, worsening shortness of breath.   

## 2019-03-06 ENCOUNTER — Telehealth: Payer: Self-pay | Admitting: Internal Medicine

## 2019-03-06 NOTE — Telephone Encounter (Signed)
Please schedule pt an morning in person appointment

## 2019-03-06 NOTE — Telephone Encounter (Signed)
Patient called requesting the doctor give her a call back because she is burring under her skin on her stomach pt things its shingles for the 3rd time.

## 2019-03-09 ENCOUNTER — Ambulatory Visit: Payer: Medicare HMO | Admitting: Internal Medicine

## 2019-03-22 DIAGNOSIS — R32 Unspecified urinary incontinence: Secondary | ICD-10-CM | POA: Diagnosis not present

## 2019-03-22 DIAGNOSIS — F0390 Unspecified dementia without behavioral disturbance: Secondary | ICD-10-CM | POA: Diagnosis not present

## 2019-03-22 DIAGNOSIS — N3941 Urge incontinence: Secondary | ICD-10-CM | POA: Diagnosis not present

## 2019-03-27 ENCOUNTER — Other Ambulatory Visit: Payer: Self-pay | Admitting: Internal Medicine

## 2019-03-27 DIAGNOSIS — E785 Hyperlipidemia, unspecified: Secondary | ICD-10-CM

## 2019-03-29 DIAGNOSIS — N3946 Mixed incontinence: Secondary | ICD-10-CM | POA: Diagnosis not present

## 2019-03-29 DIAGNOSIS — N319 Neuromuscular dysfunction of bladder, unspecified: Secondary | ICD-10-CM | POA: Diagnosis not present

## 2019-04-07 ENCOUNTER — Telehealth: Payer: Self-pay | Admitting: Internal Medicine

## 2019-04-07 MED ORDER — NOVOLOG FLEXPEN 100 UNIT/ML ~~LOC~~ SOPN: 5.0000 [IU] | PEN_INJECTOR | Freq: Three times a day (TID) | SUBCUTANEOUS | 0 refills | Status: DC

## 2019-04-07 NOTE — Telephone Encounter (Signed)
Pt states out of humalog over a week stating pharmacy has been waiting for provider to give prior auth.   [facility] @  [address] per pt has tried to [facility].

## 2019-04-07 NOTE — Telephone Encounter (Signed)
Switch made.

## 2019-04-07 NOTE — Telephone Encounter (Signed)
Front Desk: patient claims that she needs virtual visit w/ Dr. Wynetta Emery and something has gone wrong with that, please contact her to schedule an appt as soon as you are able.   Luke: Due to insurance preference we need to switch back to novolog, she is aware and agreeable to the change, please send asap, pt wants RX @ [facility] on [address]

## 2019-04-13 ENCOUNTER — Telehealth: Payer: Self-pay | Admitting: Internal Medicine

## 2019-04-13 DIAGNOSIS — E1140 Type 2 diabetes mellitus with diabetic neuropathy, unspecified: Secondary | ICD-10-CM

## 2019-04-13 DIAGNOSIS — IMO0002 Reserved for concepts with insufficient information to code with codable children: Secondary | ICD-10-CM

## 2019-04-13 NOTE — Telephone Encounter (Signed)
Pt needs correct scripts sent to [facility] at [address] in [facility]. Pt is out of sugar meds. Pt says ALL scripts should be written "BRAND NAME NECESSARY" on script.  She needs ASAP called in to pharmacy. Pt states will not pick them up as they were called in for her. NOVOLOG FLEXPEN  70 ml four to five times daily (name brand needed) &  LANTUS FLEXPEN (brand name only) once per day & VICTOZA (brand name only) once daily. Pt wants noted for provider that even her pills should say brand name necessary on the script from now on. Pt ph [phone].

## 2019-04-14 MED ORDER — NOVOLOG FLEXPEN 100 UNIT/ML ~~LOC~~ SOPN: 50.0000 [IU] | PEN_INJECTOR | Freq: Three times a day (TID) | SUBCUTANEOUS | 0 refills | Status: DC

## 2019-04-14 NOTE — Telephone Encounter (Signed)
I called and spoke with the pharmacy first who said they dispensed the newly released generic form of novolog insulin to the patient and she was very unhappy with this and refused to take it. The pharmacist explained to her that it is standard in the pharmacy to change all products to generics when they are available and pointed out that she takes many generic prescriptions. This upset Ms Botello and she insisted that she be switched to brand name for all her medications. She left before the pharmacist could finish explaining that switching to all brand name is not a feasible option for her. We also discussed the discrepancy in dosing of her novolog which was sent for 5 units tid but should have been sent for 50 units tid. It was resent for the correct dosing and patient preference for brand name was noted.   I called and spoke with the patient, she confirmed DOB. I told her that I had corrected the dosing instructions with her novolog and she expressed concern with a change to generic since she has switched back and forth from humalog to novolog several times recently. I told her she can still receive brand name and that it is not considered a DAW-1, it is not technically medically necessary as it has not met certain criteria and been documented in her chart. It does however qualify for a DAW-2 a patient requested brand name which means she will still receive the brand name product covered by the insurance and it will be billed properly. I then explained to her why many of her medications are not available in brand name, they have been offered as generics for so long that the brand name has been discontinued. She will continue to receive medications certain medications like her dexilant, victoza, synthroid, lantus, and novolog in brand name but all others will continue to be generic drugs. I also mentioned that if she were to want to switch to similar products that are brand name that she would see a significant  increase in her drug costs. I asked her several times if this made sense and if she felt comfortable with her current regiment given the explanation. She agreed but it seemed to be with hesitation.

## 2019-04-18 ENCOUNTER — Other Ambulatory Visit: Payer: Self-pay | Admitting: Internal Medicine

## 2019-04-18 DIAGNOSIS — E890 Postprocedural hypothyroidism: Secondary | ICD-10-CM

## 2019-04-20 ENCOUNTER — Telehealth: Payer: Self-pay | Admitting: Internal Medicine

## 2019-04-20 NOTE — Telephone Encounter (Signed)
Returned pt call   Pt states she think this is the 3rd time of the same episode that she had regarding shingles. Pt states she is having a constant burning sensation. Pt states its not breaking the skin. Pt states the symptoms started a month ago. Pt states she think she has cellulitis on b/l leg. Pt states she has tried to get a virtual visit but hasn't been able to get anything. Pt states she went off the fluid pills because her kidney was 32. Pt states she started taking the fluid pills to see if that would help.   Pt is requesting a call back from provider

## 2019-04-20 NOTE — Telephone Encounter (Signed)
Patient want to talk to Dr Wynetta Emery  Regarding her legs and stomach pain . Please, call her  Thank you

## 2019-04-21 NOTE — Telephone Encounter (Signed)
Called lvm

## 2019-04-21 NOTE — Telephone Encounter (Signed)
Anita James could you contact pt and schedule

## 2019-04-24 DIAGNOSIS — F0390 Unspecified dementia without behavioral disturbance: Secondary | ICD-10-CM | POA: Diagnosis not present

## 2019-04-24 DIAGNOSIS — N3941 Urge incontinence: Secondary | ICD-10-CM | POA: Diagnosis not present

## 2019-04-24 DIAGNOSIS — R32 Unspecified urinary incontinence: Secondary | ICD-10-CM | POA: Diagnosis not present

## 2019-04-27 ENCOUNTER — Other Ambulatory Visit: Payer: Self-pay | Admitting: Internal Medicine

## 2019-04-27 DIAGNOSIS — IMO0002 Reserved for concepts with insufficient information to code with codable children: Secondary | ICD-10-CM

## 2019-04-27 DIAGNOSIS — E1140 Type 2 diabetes mellitus with diabetic neuropathy, unspecified: Secondary | ICD-10-CM

## 2019-04-28 DIAGNOSIS — N319 Neuromuscular dysfunction of bladder, unspecified: Secondary | ICD-10-CM | POA: Diagnosis not present

## 2019-04-28 DIAGNOSIS — N3946 Mixed incontinence: Secondary | ICD-10-CM | POA: Diagnosis not present

## 2019-05-01 ENCOUNTER — Telehealth: Payer: Self-pay

## 2019-05-01 ENCOUNTER — Ambulatory Visit: Payer: Medicare HMO | Attending: Internal Medicine | Admitting: Internal Medicine

## 2019-05-01 ENCOUNTER — Other Ambulatory Visit: Payer: Self-pay

## 2019-05-01 DIAGNOSIS — Z538 Procedure and treatment not carried out for other reasons: Secondary | ICD-10-CM

## 2019-05-01 NOTE — Progress Notes (Signed)
Patient was offered an in person visit by my CMA when she was called on 04/28/2019 to remind her of visit today.  According to [place] patient told her that she would need to get back to her on whether she wanted an in person visit.  She did not call back. We were to do the telephone visit today.  I called patient at 3:46 PM on both numbers listed on the chart.  I left a voicemail message letting her know who I am.  She called back later requesting to have the visit done.  I subsequently called her back again at 4:48 PM and patient did not answer.  I got a voicemail but did not leave a message.  I will have our schedulers reach out to her to reschedule her appointment.

## 2019-05-01 NOTE — Progress Notes (Signed)
Pt states her pain is coming from several locations  Pt states she still has the burning in her stomach which she thinks the shingles never broke   Pt states her blood sugar this morning was 136

## 2019-05-01 NOTE — Telephone Encounter (Signed)
Pt says Anita James called but pt has difficulty with the buttons on her phone and disconnected the call. Pt says she cannot pick up incoming calls. Informed pt provider will return the call to her. Pt became very angry stating just forget it.

## 2019-05-01 NOTE — Telephone Encounter (Signed)
Will forward to pcp

## 2019-05-02 DIAGNOSIS — N319 Neuromuscular dysfunction of bladder, unspecified: Secondary | ICD-10-CM | POA: Diagnosis not present

## 2019-05-02 DIAGNOSIS — N3946 Mixed incontinence: Secondary | ICD-10-CM | POA: Diagnosis not present

## 2019-05-03 ENCOUNTER — Telehealth: Payer: Self-pay | Admitting: Internal Medicine

## 2019-05-03 NOTE — Telephone Encounter (Signed)
Called pt to inform provider requested a in person appt...pt rejected appt and hung up

## 2019-05-03 NOTE — Telephone Encounter (Signed)
-----   Message from Ladell Pier, MD sent at 05/01/2019  5:37 PM EDT ----- Patient needs follow-up in person appointment.

## 2019-05-09 ENCOUNTER — Other Ambulatory Visit: Payer: Self-pay

## 2019-05-09 ENCOUNTER — Ambulatory Visit: Payer: Medicare HMO | Attending: Family Medicine | Admitting: Family Medicine

## 2019-05-09 ENCOUNTER — Telehealth: Payer: Self-pay

## 2019-05-09 ENCOUNTER — Encounter: Payer: Self-pay | Admitting: Family Medicine

## 2019-05-09 VITALS — BP 141/82 | HR 98 | Temp 98.2°F | Wt 313.0 lb

## 2019-05-09 DIAGNOSIS — N644 Mastodynia: Secondary | ICD-10-CM

## 2019-05-09 DIAGNOSIS — IMO0002 Reserved for concepts with insufficient information to code with codable children: Secondary | ICD-10-CM

## 2019-05-09 DIAGNOSIS — E1165 Type 2 diabetes mellitus with hyperglycemia: Secondary | ICD-10-CM | POA: Diagnosis not present

## 2019-05-09 DIAGNOSIS — Z6841 Body Mass Index (BMI) 40.0 and over, adult: Secondary | ICD-10-CM | POA: Diagnosis not present

## 2019-05-09 DIAGNOSIS — E1140 Type 2 diabetes mellitus with diabetic neuropathy, unspecified: Secondary | ICD-10-CM

## 2019-05-09 DIAGNOSIS — N182 Chronic kidney disease, stage 2 (mild): Secondary | ICD-10-CM | POA: Diagnosis not present

## 2019-05-09 DIAGNOSIS — E1122 Type 2 diabetes mellitus with diabetic chronic kidney disease: Secondary | ICD-10-CM | POA: Diagnosis not present

## 2019-05-09 DIAGNOSIS — B0229 Other postherpetic nervous system involvement: Secondary | ICD-10-CM

## 2019-05-09 LAB — POCT GLYCOSYLATED HEMOGLOBIN (HGB A1C): HbA1c, POC (controlled diabetic range): 6.5 % (ref 0.0–7.0)

## 2019-05-09 LAB — GLUCOSE, POCT (MANUAL RESULT ENTRY): POC Glucose: 159 mg/dl — AB (ref 70–99)

## 2019-05-09 MED ORDER — VALACYCLOVIR HCL 1 G PO TABS: 1000.0000 mg | ORAL_TABLET | Freq: Two times a day (BID) | ORAL | 0 refills | Status: DC

## 2019-05-09 MED ORDER — NOVOLOG FLEXPEN 100 UNIT/ML ~~LOC~~ SOPN: 50.0000 [IU] | PEN_INJECTOR | Freq: Three times a day (TID) | SUBCUTANEOUS | 1 refills | Status: DC

## 2019-05-09 NOTE — Telephone Encounter (Signed)
Pt states out of insulin none for this morning. Pt states urgent to speak with [facility] or CMA. [facility] on [address]. Pt says she cannot eat breakfast until she has insulin & wants it called in now. Pt given appt today [place] 1:50.

## 2019-05-09 NOTE — Telephone Encounter (Signed)
Lurena Joiner can you follow up with pt

## 2019-05-09 NOTE — Telephone Encounter (Signed)
Patient has a same day appointment with Dr. Margarita Rana today.

## 2019-05-09 NOTE — Progress Notes (Signed)
Subjective:  [name], female    DOB: [birth year]  Age: 68 y.o. MRN: [id number]  CC: Diabetes   HPI Anita James is a 68 year old female with a history of Type 2 Diabetes Mellitus (A1c 6.5), HTN, Stage 2 CKD,Obesity here requesting refills of her Novolog. Diabetes is managed by Endocrine, [facility]. She is requesting Novolog 60U 4x/daily however notes from care everywhere reviewed and her Novolog was decreased from 60 U tid to 50 units tid. She complains of LUQ pain and burning; states she has had shingles 3 times but never broke out in a rash.Sensation is heightened when with touch. She also feels a pulsating sensation in her R breast with associated pain this has ben going on for the last 3-4 months. Denies presence of lumps.  She has discolorations on her legs she would like looked at, also complains of leg swelling not appreciated on my exam. I have advised her to schedule an appointment to discuss other concerns with PCP meanwhile elvate legs, reduce sodium intake and use compression stockings.  Past Medical History:  Diagnosis Date  . Anxiety   . Carpal tunnel syndrome 06/14/2014  . Chest pain   . CHF (congestive heart failure) ([place])   . Chronic back pain   . Chronic kidney disease 03/2013   failure due to meds  . Diabetes mellitus type II, uncontrolled ([place])   . Diverticulitis   . Frequent headaches   . Hiatal hernia   . Hyperlipemia   . Hypertension   . Hypothyroidism   . 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 Neuropathy   . Obesity   . Pneumonia   . Vitamin D deficiency 06/05/2014    Past Surgical History:  Procedure Laterality Date  . CESAREAN SECTION    . KNEE SURGERY Left   . THYROID SURGERY     goiter    Family History  Problem Relation Age of Onset  . Cancer Mother        cervical- mets  . Cancer Father   . Heart disease Father   . Diabetes Father   . Diabetes Sister   . Heart disease Sister   . Heart disease Sister   . COPD Sister   . Colon  cancer Neg Hx   . Esophageal cancer Neg Hx   . Rectal cancer Neg Hx   . Stomach cancer Neg Hx     Allergies  Allergen Reactions  . Amoxicillin-Pot Clavulanate Nausea And Vomiting and Other (See Comments)    Combination of medications tear lining of stomach  . Hydrocodone-Acetaminophen Shortness Of Breath  . Oxycodone Shortness Of Breath    oxycontin too  . Sulfa Antibiotics Shortness Of Breath  . Norvasc [Amlodipine Besylate] Swelling    feet  . Sulfamethoxazole Nausea Only  . Tramadol Other (See Comments)    Unknown, toxicity. Patient advised she had to be given Narcan with this    Outpatient Medications Prior to Visit  Medication Sig Dispense Refill  . ACCU-CHEK SOFTCLIX LANCETS lancets USE AS DIRECTED TO TEST TWICE DAILY 100 each 12  . acetaminophen (TYLENOL) 500 MG tablet Take 1 tablet (500 mg total) by mouth every 6 (six) hours as needed. 30 tablet 0  . aspirin EC 81 MG tablet Take 81 mg by mouth daily.    Marland Kitchen atorvastatin (LIPITOR) 40 MG tablet TAKE 1 TABLET BY MOUTH DAILY AT 6 PM 90 tablet 0  . Blood Glucose Monitoring Suppl (ACCU-CHEK AVIVA PLUS) w/Device KIT USE  AS DIRECTED TO TEST TWICE DAILY 1 kit 0  . carvedilol (COREG) 12.5 MG tablet Take 1.5 tablets (18.75 mg total) by mouth 2 (two) times daily with a meal. 270 tablet 3  . Cetirizine HCl 10 MG CAPS Take 1 capsule (10 mg total) by mouth daily. 30 capsule 0  . dexlansoprazole (DEXILANT) 60 MG capsule Take 1 capsule (60 mg total) by mouth daily. 90 capsule 2  . diclofenac sodium (VOLTAREN) 1 % GEL Apply 4 g topically 4 (four) times daily. 100 g 0  . Ergocalciferol (VITAMIN D2) 10 MCG (400 UNIT) TABS Take 400 Units by mouth daily. 90 tablet 0  . fluticasone (FLONASE) 50 MCG/ACT nasal spray PLACE 2 SPRAYS IN EACH NOSTRIL DAILY AS NEEDED FOR ALLERGIES 48 g 0  . gabapentin (NEURONTIN) 300 MG capsule TAKE 2 CAPSULES BY MOUTH 2 TIMES DAILY. 120 capsule 6  . glucose blood (ACCU-CHEK AVIVA PLUS) test strip USE AS DIRECTED TO TEST  TWICE DAILY 100 each 12  . Insulin Glargine (LANTUS SOLOSTAR) 100 UNIT/ML Solostar Pen Inject 84 Units into the skin daily. 10 pen 11  . Insulin Pen Needle (TRUEPLUS PEN NEEDLES) 32G X 4 MM MISC Use to inject insulin. 100 each 11  . liraglutide (VICTOZA) 18 MG/3ML SOPN INJECT 1.8 MG UNDER THE SKIN DAILY 9 mL 0  . lisinopril (PRINIVIL,ZESTRIL) 40 MG tablet Take 1 tablet (40 mg total) by mouth daily. 90 tablet 3  . mometasone (NASONEX) 50 MCG/ACT nasal spray Place 2 sprays into the nose daily. (Patient taking differently: Place 2 sprays into the nose daily as needed (allergies). ) 17 g 3  . Multiple Vitamins-Minerals (EYE VITAMINS) CAPS Take 1 capsule by mouth daily.    . NYSTATIN powder APPLY TOPICALLY 4 TIMES DAILY. (Patient taking differently: Apply 1 Bottle topically 4 (four) times daily as needed (rash). ) 15 g 0  . ondansetron (ZOFRAN) 4 MG tablet Take 1 tablet (4 mg total) by mouth every 6 (six) hours. 12 tablet 0  . sitaGLIPtin (JANUVIA) 50 MG tablet TAKE 1 TABLET (50 MG TOTAL) BY MOUTH DAILY. 30 tablet 6  . spironolactone (ALDACTONE) 25 MG tablet Take 25 mg, one tablet, in the morning and then 12.5 mg, half a tablet, in the afternoon. 135 tablet 3  . SYNTHROID 100 MCG tablet TAKE 1 TABLET BY MOUTH EVERY DAY 30 tablet 0  . torsemide (DEMADEX) 20 MG tablet Take 1 tablet by mouth daily as needed (fluid).   11  . insulin aspart (NOVOLOG FLEXPEN) 100 UNIT/ML FlexPen Inject 50 Units into the skin 3 (three) times daily with meals. 45 mL 0   No facility-administered medications prior to visit.      ROS Review of Systems  Constitutional: Negative for activity change, appetite change and fatigue.  HENT: Negative for congestion, sinus pressure and sore throat.   Eyes: Negative for visual disturbance.  Respiratory: Negative for cough, chest tightness, shortness of breath and wheezing.   Cardiovascular: Negative for chest pain and palpitations.  Gastrointestinal: Negative for abdominal  distention, abdominal pain and constipation.  Endocrine: Negative for polydipsia.  Genitourinary: Negative for dysuria and frequency.  Musculoskeletal:       See Hpi  Skin: Negative for rash.  Neurological: Negative for tremors, light-headedness and numbness.  Hematological: Does not bruise/bleed easily.  Psychiatric/Behavioral: Negative for agitation and behavioral problems.    Objective:  BP (!) 141/82   Pulse 98   Temp 98.2 F (36.8 C) (Oral)   Wt (!) 313 lb (142  kg)   SpO2 96%   BMI 50.52 kg/m   BP/Weight 05/09/2019 02/27/2019 1/61/0960  Systolic BP 454 098 119  Diastolic BP 82 80 76  Wt. (Lbs) 313 - 309.6  BMI 50.52 - 49.97      Physical Exam Constitutional:      Appearance: She is well-developed.  Cardiovascular:     Rate and Rhythm: Normal rate.     Heart sounds: Normal heart sounds. No murmur.  Pulmonary:     Effort: Pulmonary effort is normal.     Breath sounds: Normal breath sounds. No wheezing or rales.  Chest:     Chest wall: No tenderness.     Breasts:        Right: Tenderness (generalized) present. No mass.        Left: No mass or tenderness.  Abdominal:     General: Bowel sounds are normal. There is no distension.     Palpations: Abdomen is soft. There is no mass.     Tenderness: There is no abdominal tenderness.  Musculoskeletal: Normal range of motion.     Comments: Negative Homan's sign  Neurological:     Mental Status: She is alert and oriented to person, place, and time.     Comments: Hyperesthesia in LUQ, no rash     CMP Latest Ref Rng & Units 02/27/2019 09/15/2018 06/14/2018  Glucose 70 - 99 mg/dL 85 255(H) 138(H)  BUN 8 - 23 mg/dL 21 24(H) 16  Creatinine 0.44 - 1.00 mg/dL 0.99 1.16(H) 0.90  Sodium 135 - 145 mmol/L 139 138 142  Potassium 3.5 - 5.1 mmol/L 4.1 4.7 5.5(H)  Chloride 98 - 111 mmol/L 106 107 109(H)  CO2 22 - 32 mmol/L '25 22 20  ' Calcium 8.9 - 10.3 mg/dL 9.0 8.6(L) 9.4  Total Protein 6.5 - 8.1 g/dL - 6.2(L) -  Total Bilirubin  0.3 - 1.2 mg/dL - 0.8 -  Alkaline Phos 38 - 126 U/L - 55 -  AST 15 - 41 U/L - 27 -  ALT 0 - 44 U/L - 17 -    Lipid Panel     Component Value Date/Time   CHOL 110 11/25/2016 1115   TRIG 169 (H) 11/25/2016 1115   HDL 36 (L) 11/25/2016 1115   CHOLHDL 3.1 11/25/2016 1115   VLDL 34 (H) 11/25/2016 1115   LDLCALC 40 11/25/2016 1115    CBC    Component Value Date/Time   WBC 7.3 02/27/2019 1734   RBC 3.81 (L) 02/27/2019 1734   HGB 11.6 (L) 02/27/2019 1734   HGB 11.4 04/29/2018 1542   HCT 35.3 (L) 02/27/2019 1734   HCT 36.0 04/29/2018 1542   PLT 222 02/27/2019 1734   PLT 215 04/29/2018 1542   MCV 92.7 02/27/2019 1734   MCV 92 04/29/2018 1542   MCV 84 06/21/2013 0438   MCH 30.4 02/27/2019 1734   MCHC 32.9 02/27/2019 1734   RDW 13.2 02/27/2019 1734   RDW 13.4 04/29/2018 1542   RDW 13.2 06/21/2013 0438   LYMPHSABS 1.7 02/27/2019 1734   LYMPHSABS 1.4 04/29/2018 1542   MONOABS 0.7 02/27/2019 1734   EOSABS 0.1 02/27/2019 1734   EOSABS 0.1 04/29/2018 1542   BASOSABS 0.0 02/27/2019 1734   BASOSABS 0.0 04/29/2018 1542    Lab Results  Component Value Date   HGBA1C 6.5 05/09/2019    Assessment & Plan:   1. Type 2 diabetes, uncontrolled, with neuropathy (HCC) Controlled with A1c of 6.5 from Care everywhere Review of endocrine note indicates  that she should be on 50 units of NovoLog 3 times daily which I have refilled (of note the patient is requesting 60 units 4 times daily); I refilled as per endocrine note Follow-up with PCP regarding this - POCT glucose (manual entry) - POCT glycosylated hemoglobin (Hb A1C) - CMP14+EGFR - insulin aspart (NOVOLOG FLEXPEN) 100 UNIT/ML FlexPen; Inject 50 Units into the skin 3 (three) times daily with meals.  Dispense: 45 mL; Refill: 1  2. Post herpetic neuralgia Will treat [resumptively for Herpes She is already on Gabapentin - valACYclovir (VALTREX) 1000 MG tablet; Take 1 tablet (1,000 mg total) by mouth 2 (two) times daily.  Dispense: 14  tablet; Refill: 0  3. Mastodynia of right breast - MM Digital Diagnostic Bilat; Future - US BREAST LTD UNI RIGHT INC AXILLA; Future  4. Stage 2 chronic kidney disease She brings in lab request from her Nephrologist - will fax results to Dr Candiss Norse [phone] - CBC with Differential/Platelet - PTH, Intact and Calcium - Urinalysis, Routine w reflex microscopic   Health Care Maintenance: referred for mammogram; other will be addressed by PCP  Meds ordered this encounter  Medications  . valACYclovir (VALTREX) 1000 MG tablet    Sig: Take 1 tablet (1,000 mg total) by mouth 2 (two) times daily.    Dispense:  14 tablet    Refill:  0  . insulin aspart (NOVOLOG FLEXPEN) 100 UNIT/ML FlexPen    Sig: Inject 50 Units into the skin 3 (three) times daily with meals.    Dispense:  45 mL    Refill:  1    Patient prefers brand name    Follow-up: Return in about 1 month (around 06/09/2019) for follow up of medical conditions with PCP.       Charlott Rakes, MD, FAAFP. [facility] and [place], [place]   05/09/2019, 2:44 PM

## 2019-05-09 NOTE — Progress Notes (Signed)
Patient needs refill on insulin. Out of novolog   Patient states that she has shingles  Sharp breast pains.

## 2019-05-10 ENCOUNTER — Telehealth: Payer: Self-pay

## 2019-05-10 LAB — CBC WITH DIFFERENTIAL/PLATELET
Basophils Absolute: 0 10*3/uL (ref 0.0–0.2)
Basos: 0 %
EOS (ABSOLUTE): 0.1 10*3/uL (ref 0.0–0.4)
Eos: 1 %
Hematocrit: 34.9 % (ref 34.0–46.6)
Hemoglobin: 12 g/dL (ref 11.1–15.9)
Immature Grans (Abs): 0 10*3/uL (ref 0.0–0.1)
Immature Granulocytes: 0 %
Lymphocytes Absolute: 1.5 10*3/uL (ref 0.7–3.1)
Lymphs: 26 %
MCH: 30.4 pg (ref 26.6–33.0)
MCHC: 34.4 g/dL (ref 31.5–35.7)
MCV: 88 fL (ref 79–97)
Monocytes Absolute: 0.6 10*3/uL (ref 0.1–0.9)
Monocytes: 11 %
Neutrophils Absolute: 3.6 10*3/uL (ref 1.4–7.0)
Neutrophils: 62 %
Platelets: 226 10*3/uL (ref 150–450)
RBC: 3.95 x10E6/uL (ref 3.77–5.28)
RDW: 12.6 % (ref 11.7–15.4)
WBC: 5.8 10*3/uL (ref 3.4–10.8)

## 2019-05-10 LAB — CMP14+EGFR
ALT: 17 IU/L (ref 0–32)
AST: 12 IU/L (ref 0–40)
Albumin/Globulin Ratio: 1.5 (ref 1.2–2.2)
Albumin: 4 g/dL (ref 3.8–4.8)
Alkaline Phosphatase: 74 IU/L (ref 39–117)
BUN/Creatinine Ratio: 19 (ref 12–28)
BUN: 17 mg/dL (ref 8–27)
Bilirubin Total: 0.4 mg/dL (ref 0.0–1.2)
CO2: 27 mmol/L (ref 20–29)
Calcium: 8.9 mg/dL (ref 8.7–10.3)
Chloride: 106 mmol/L (ref 96–106)
Creatinine, Ser: 0.9 mg/dL (ref 0.57–1.00)
GFR calc Af Amer: 78 mL/min/{1.73_m2} (ref >59)
GFR calc non Af Amer: 67 mL/min/{1.73_m2} (ref >59)
Globulin, Total: 2.7 g/dL (ref 1.5–4.5)
Glucose: 122 mg/dL — ABNORMAL HIGH (ref 65–99)
Potassium: 4.4 mmol/L (ref 3.5–5.2)
Sodium: 145 mmol/L — ABNORMAL HIGH (ref 134–144)
Total Protein: 6.7 g/dL (ref 6.0–8.5)

## 2019-05-10 LAB — MICROSCOPIC EXAMINATION
Bacteria, UA: NONE SEEN
Casts: NONE SEEN /lpf

## 2019-05-10 LAB — URINALYSIS, ROUTINE W REFLEX MICROSCOPIC
Bilirubin, UA: NEGATIVE
Glucose, UA: NEGATIVE
Ketones, UA: NEGATIVE
Leukocytes,UA: NEGATIVE
Nitrite, UA: NEGATIVE
RBC, UA: NEGATIVE
Specific Gravity, UA: 1.028 (ref 1.005–1.030)
Urobilinogen, Ur: 0.2 mg/dL (ref 0.2–1.0)
pH, UA: 6 (ref 5.0–7.5)

## 2019-05-10 LAB — PTH, INTACT AND CALCIUM: PTH: 38 pg/mL (ref 15–65)

## 2019-05-10 NOTE — Telephone Encounter (Signed)
-----   Message from Charlott Rakes, MD sent at 05/10/2019  1:09 PM EDT ----- Labs are stable except for urine which reveals 1+ protein. Please fax result to her Nephrologist - Dr Candiss Norse [phone]. Thanks

## 2019-05-10 NOTE — Telephone Encounter (Signed)
Labs has been faxed over to Dr. Candiss Norse as requested by patient.

## 2019-05-12 ENCOUNTER — Telehealth: Payer: Self-pay | Admitting: Internal Medicine

## 2019-05-12 NOTE — Telephone Encounter (Signed)
Patient called wanting to get her lab results. Please follow up.

## 2019-05-13 ENCOUNTER — Other Ambulatory Visit: Payer: Self-pay | Admitting: Internal Medicine

## 2019-05-13 DIAGNOSIS — E890 Postprocedural hypothyroidism: Secondary | ICD-10-CM

## 2019-05-15 ENCOUNTER — Telehealth: Payer: Self-pay | Admitting: Internal Medicine

## 2019-05-15 ENCOUNTER — Other Ambulatory Visit: Payer: Self-pay

## 2019-05-15 ENCOUNTER — Ambulatory Visit
Admission: RE | Admit: 2019-05-15 | Discharge: 2019-05-15 | Disposition: A | Discharge status: Home / Self Care | Payer: Medicare HMO | Source: Ambulatory Visit | Attending: Family Medicine | Admitting: Family Medicine

## 2019-05-15 ENCOUNTER — Ambulatory Visit: Admission: RE | Admit: 2019-05-15 | Payer: Medicare HMO | Source: Ambulatory Visit

## 2019-05-15 DIAGNOSIS — N644 Mastodynia: Secondary | ICD-10-CM

## 2019-05-15 DIAGNOSIS — R928 Other abnormal and inconclusive findings on diagnostic imaging of breast: Secondary | ICD-10-CM | POA: Diagnosis not present

## 2019-05-15 NOTE — Telephone Encounter (Signed)
Refaxed lab results

## 2019-05-15 NOTE — Telephone Encounter (Signed)
Des[place] with [facility] called in regards to orders that were faxed over for lab results. Please follow up.

## 2019-05-16 ENCOUNTER — Other Ambulatory Visit: Payer: Medicare HMO

## 2019-05-16 ENCOUNTER — Telehealth: Payer: Self-pay

## 2019-05-16 NOTE — Telephone Encounter (Signed)
Contacted pt to go over MM results pt didn't answer left a detailed vm informing pt of results and if she has any questions or concerns to give me a call

## 2019-05-17 ENCOUNTER — Telehealth: Payer: Self-pay | Admitting: Internal Medicine

## 2019-05-17 NOTE — Telephone Encounter (Signed)
Please contact pt for results

## 2019-05-17 NOTE — Telephone Encounter (Signed)
Patient called requesting a follow up call in regards to her results. Please follow up

## 2019-05-18 NOTE — Telephone Encounter (Signed)
Patient was called and a voicemail was left informing patient to return phone call for lab results. 

## 2019-05-19 ENCOUNTER — Other Ambulatory Visit: Payer: Self-pay | Admitting: Internal Medicine

## 2019-05-19 MED ORDER — CIPROFLOXACIN HCL 500 MG PO TABS: 500.0000 mg | ORAL_TABLET | Freq: Two times a day (BID) | ORAL | 0 refills | Status: DC

## 2019-05-19 NOTE — Telephone Encounter (Signed)
Follow up   Pt is calling back very upset because she has not received an antibiotic for the UTI she was told she had. Please f/u

## 2019-05-19 NOTE — Telephone Encounter (Signed)
Will forward to pcp regarding antibiotic

## 2019-05-19 NOTE — Telephone Encounter (Signed)
Follow up   Pt returning call and also requesting an antibiotic for UTI. Please f/u

## 2019-05-19 NOTE — Telephone Encounter (Signed)
Returned pt call and pt states she was told by her Kidney doctor that she has a UTI. Pt states her kidney doctor told her that she will need to get treated with an antibiotic from her pcp  Pt states she has been nausea for 3 days and she has been having pressure in the bottom of her stomach

## 2019-05-19 NOTE — Telephone Encounter (Signed)
Phone call placed to patient.  I left message on her voicemail letting her know that the UA result is in the computer does not suggest a urinary tract infection but if her nephrologist told her that she has a urinary tract infection I will go ahead and send prescription to her pharmacy [facility] for antibiotics which she can pick up. Prescription sent to the pharmacy for Cipro.

## 2019-05-24 DIAGNOSIS — R32 Unspecified urinary incontinence: Secondary | ICD-10-CM | POA: Diagnosis not present

## 2019-05-24 DIAGNOSIS — N3941 Urge incontinence: Secondary | ICD-10-CM | POA: Diagnosis not present

## 2019-05-24 DIAGNOSIS — F0390 Unspecified dementia without behavioral disturbance: Secondary | ICD-10-CM | POA: Diagnosis not present

## 2019-05-25 ENCOUNTER — Other Ambulatory Visit: Payer: Self-pay | Admitting: Internal Medicine

## 2019-05-25 DIAGNOSIS — IMO0002 Reserved for concepts with insufficient information to code with codable children: Secondary | ICD-10-CM

## 2019-05-25 DIAGNOSIS — E1140 Type 2 diabetes mellitus with diabetic neuropathy, unspecified: Secondary | ICD-10-CM

## 2019-05-31 ENCOUNTER — Other Ambulatory Visit: Payer: Self-pay | Admitting: Internal Medicine

## 2019-05-31 DIAGNOSIS — E890 Postprocedural hypothyroidism: Secondary | ICD-10-CM

## 2019-05-31 MED ORDER — LEVOTHYROXINE SODIUM 100 MCG PO TABS: 100.0000 ug | ORAL_TABLET | Freq: Every day | ORAL | 2 refills | Status: DC

## 2019-05-31 NOTE — Telephone Encounter (Signed)
Synthroid only name given for refills. Will send refills for Synthroid. Pt must specify which medications are needed or have her pharmacy request refills.

## 2019-05-31 NOTE — Telephone Encounter (Signed)
1) Medication(s) Requested (by name): Patient states all medications need refills refused to name medications needed.  States thyroid medication is needed 2) Pharmacy of Choice: [facility] on [address] in [place]

## 2019-06-01 DIAGNOSIS — N319 Neuromuscular dysfunction of bladder, unspecified: Secondary | ICD-10-CM | POA: Diagnosis not present

## 2019-06-01 DIAGNOSIS — N3946 Mixed incontinence: Secondary | ICD-10-CM | POA: Diagnosis not present

## 2019-06-20 ENCOUNTER — Telehealth: Payer: Self-pay | Admitting: Internal Medicine

## 2019-06-20 NOTE — Telephone Encounter (Signed)
Patient called wanting to talk to someone in regards to contacting [facility] for medical supplies did not disclose additional information. please follow up.

## 2019-06-21 ENCOUNTER — Telehealth: Payer: Self-pay | Admitting: Internal Medicine

## 2019-06-21 NOTE — Telephone Encounter (Signed)
New Message   [place] with Dr. [facility] is calling to check on an order for a freestyle libre. Please f/u

## 2019-06-22 ENCOUNTER — Other Ambulatory Visit: Payer: Self-pay | Admitting: Family Medicine

## 2019-06-22 DIAGNOSIS — IMO0002 Reserved for concepts with insufficient information to code with codable children: Secondary | ICD-10-CM

## 2019-06-22 DIAGNOSIS — E1140 Type 2 diabetes mellitus with diabetic neuropathy, unspecified: Secondary | ICD-10-CM

## 2019-06-22 NOTE — Telephone Encounter (Signed)
Left message on voicemail to return call- Dr. [facility] office.

## 2019-06-27 ENCOUNTER — Ambulatory Visit: Payer: Medicare HMO | Admitting: Internal Medicine

## 2019-06-28 DIAGNOSIS — F0390 Unspecified dementia without behavioral disturbance: Secondary | ICD-10-CM | POA: Diagnosis not present

## 2019-06-28 DIAGNOSIS — R32 Unspecified urinary incontinence: Secondary | ICD-10-CM | POA: Diagnosis not present

## 2019-06-28 DIAGNOSIS — N3941 Urge incontinence: Secondary | ICD-10-CM | POA: Diagnosis not present

## 2019-06-28 NOTE — Telephone Encounter (Signed)
MA spoke with patient. She does not have OSA which is the Dx needed for a CPAP. Patient will have to purchase tubing for the device she currently has.

## 2019-07-03 DIAGNOSIS — N319 Neuromuscular dysfunction of bladder, unspecified: Secondary | ICD-10-CM | POA: Diagnosis not present

## 2019-07-03 DIAGNOSIS — N3946 Mixed incontinence: Secondary | ICD-10-CM | POA: Diagnosis not present

## 2019-07-12 ENCOUNTER — Telehealth: Payer: Self-pay | Admitting: Internal Medicine

## 2019-07-12 NOTE — Telephone Encounter (Signed)
Pt will need to schedule an appointment.

## 2019-07-12 NOTE — Telephone Encounter (Signed)
New Message  Pt is requesting an antibiotic for a UTI. Please f/u

## 2019-07-13 NOTE — Telephone Encounter (Signed)
Pt is aware that an appointment is needed, she refused to schedule an appointment and is also aware that she can be seen at her nearest urgent care for this issue. Pt hung up the phone

## 2019-07-17 ENCOUNTER — Other Ambulatory Visit: Payer: Self-pay

## 2019-07-17 ENCOUNTER — Emergency Department (HOSPITAL_BASED_OUTPATIENT_CLINIC_OR_DEPARTMENT_OTHER)
Admission: EM | Admit: 2019-07-17 | Discharge: 2019-07-17 | Disposition: A | Discharge status: Home / Self Care | Payer: Medicare HMO | Attending: Emergency Medicine | Admitting: Emergency Medicine

## 2019-07-17 ENCOUNTER — Encounter (HOSPITAL_BASED_OUTPATIENT_CLINIC_OR_DEPARTMENT_OTHER): Payer: Self-pay

## 2019-07-17 DIAGNOSIS — I110 Hypertensive heart disease with heart failure: Secondary | ICD-10-CM | POA: Insufficient documentation

## 2019-07-17 DIAGNOSIS — I509 Heart failure, unspecified: Secondary | ICD-10-CM | POA: Insufficient documentation

## 2019-07-17 DIAGNOSIS — E039 Hypothyroidism, unspecified: Secondary | ICD-10-CM | POA: Insufficient documentation

## 2019-07-17 DIAGNOSIS — Z79899 Other long term (current) drug therapy: Secondary | ICD-10-CM | POA: Diagnosis not present

## 2019-07-17 DIAGNOSIS — E119 Type 2 diabetes mellitus without complications: Secondary | ICD-10-CM | POA: Diagnosis not present

## 2019-07-17 DIAGNOSIS — Z7982 Long term (current) use of aspirin: Secondary | ICD-10-CM | POA: Insufficient documentation

## 2019-07-17 DIAGNOSIS — M25562 Pain in left knee: Secondary | ICD-10-CM | POA: Insufficient documentation

## 2019-07-17 DIAGNOSIS — N390 Urinary tract infection, site not specified: Secondary | ICD-10-CM | POA: Insufficient documentation

## 2019-07-17 DIAGNOSIS — Z794 Long term (current) use of insulin: Secondary | ICD-10-CM | POA: Diagnosis not present

## 2019-07-17 DIAGNOSIS — M25561 Pain in right knee: Secondary | ICD-10-CM | POA: Diagnosis not present

## 2019-07-17 DIAGNOSIS — R300 Dysuria: Secondary | ICD-10-CM | POA: Diagnosis present

## 2019-07-17 LAB — URINALYSIS, ROUTINE W REFLEX MICROSCOPIC
Bilirubin Urine: NEGATIVE
Glucose, UA: >=500 mg/dL — AB
Ketones, ur: NEGATIVE mg/dL
Nitrite: POSITIVE — AB
Protein, ur: 30 mg/dL — AB
Specific Gravity, Urine: 1.02 (ref 1.005–1.030)
pH: 6 (ref 5.0–8.0)

## 2019-07-17 LAB — URINALYSIS, MICROSCOPIC (REFLEX)

## 2019-07-17 MED ORDER — PHENAZOPYRIDINE HCL 200 MG PO TABS: 200.0000 mg | ORAL_TABLET | Freq: Three times a day (TID) | ORAL | 0 refills | Status: DC

## 2019-07-17 MED ORDER — CEPHALEXIN 500 MG PO CAPS: 500.0000 mg | ORAL_CAPSULE | Freq: Two times a day (BID) | ORAL | 0 refills | Status: AC

## 2019-07-17 MED ORDER — CEPHALEXIN 250 MG PO CAPS
500.0000 mg | ORAL_CAPSULE | Freq: Once | ORAL | Status: AC
  Administered 2019-07-17: 19:00:00 500 mg via ORAL
  Filled 2019-07-17: qty 2

## 2019-07-17 MED ORDER — PHENAZOPYRIDINE HCL 100 MG PO TABS
95.0000 mg | ORAL_TABLET | Freq: Once | ORAL | Status: AC
  Administered 2019-07-17: 100 mg via ORAL
  Filled 2019-07-17: qty 1

## 2019-07-17 NOTE — ED Provider Notes (Signed)
[facility] EMERGENCY DEPARTMENT Provider Note   CSN: [id number] Arrival date & time: 07/17/19  1647     History   Chief Complaint Chief Complaint  Patient presents with  . Dysuria    HPI Anita James is a 68 y.o. female.     HPI  68 year old female with a history of CHF, diabetes, hypertension, hyperlipidemia, OSA, presents with concern for dysuria.  Patient reports that symptoms began on Thursday with symptoms of dysuria, frequency, urgency.  Reports these are consistent with her prior urinary tract infection symptoms.  Reports that she attempted to take Azo and another over-the-counter medication for bladder spasm over the weekend, but did not have any improvement.  She called her primary care physician but was told she had to pay upfront and was unable to do this and presents to the emergency department for her symptoms.  Denies nausea, vomiting, fevers, back pain, abdominal pain, generalized weakness.  Reports that she has had bilateral knee pain with pain extending from her knees to behind her bilateral knees.  Denies acute shortness of breath on my evaluation.   Past Medical History:  Diagnosis Date  . Anxiety   . Carpal tunnel syndrome 06/14/2014  . Chest pain   . CHF (congestive heart failure) ([place])   . Chronic back pain   . Chronic kidney disease 03/2013   failure due to meds  . Diabetes mellitus type II, uncontrolled ([place])   . Diverticulitis   . Frequent headaches   . Hiatal hernia   . Hyperlipemia   . Hypertension   . Hypothyroidism   . 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 Neuropathy   . Obesity   . Pneumonia   . Vitamin D deficiency 06/05/2014    Patient Active Problem List   Diagnosis Date Noted  . OSA on CPAP 05/19/2017  . Morbid obesity due to excess calories ([place]) 03/17/2017  . Neuropathic pain 11/04/2016  . Carpal tunnel syndrome 06/14/2014  . 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 Cervical radiculopathy at C8 06/05/2014  . Hyperlipemia  09/12/2013  . Diastolic CHF ([place]) 89/21/1941  . Major depression 09/12/2013  . Allergic rhinitis 06/22/2013  . Chronic pain syndrome 05/31/2013  . Gastroesophageal reflux disease without esophagitis 08/30/2012  . Essential hypertension   . Hypothyroidism   . Type 2 diabetes, uncontrolled, with neuropathy [facility])     Past Surgical History:  Procedure Laterality Date  . CESAREAN SECTION    . KNEE SURGERY Left   . THYROID SURGERY     goiter     OB History    Gravida  4   Para  3   Term  2   Preterm  1   AB  1   Living  6     SAB      TAB      Ectopic  1   Multiple  2   Live Births  5            Home Medications    Prior to Admission medications   Medication Sig Start Date End Date Taking? Authorizing Provider  ACCU-CHEK SOFTCLIX LANCETS lancets USE AS DIRECTED TO TEST TWICE DAILY 04/27/18   Ladell Pier, MD  acetaminophen (TYLENOL) 500 MG tablet Take 1 tablet (500 mg total) by mouth every 6 (six) hours as needed. 09/16/18   Darlin Drop P, PA-C  aspirin EC 81 MG tablet Take 81 mg by mouth daily. 11/20/16   [provider]  atorvastatin (  LIPITOR) 40 MG tablet TAKE 1 TABLET BY MOUTH DAILY AT 6 PM 03/28/19   Ladell Pier, MD  Blood Glucose Monitoring Suppl (ACCU-CHEK AVIVA PLUS) w/Device KIT USE AS DIRECTED TO TEST TWICE DAILY 04/27/18   Ladell Pier, MD  carvedilol (COREG) 12.5 MG tablet Take 1.5 tablets (18.75 mg total) by mouth 2 (two) times daily with a meal. 10/21/18   Ladell Pier, MD  cephALEXin (KEFLEX) 500 MG capsule Take 1 capsule (500 mg total) by mouth 2 (two) times daily for 7 days. 07/17/19 07/24/19  Gareth Morgan, MD  Cetirizine HCl 10 MG CAPS Take 1 capsule (10 mg total) by mouth daily. 10/21/18   Ladell Pier, MD  ciprofloxacin (CIPRO) 500 MG tablet Take 1 tablet (500 mg total) by mouth 2 (two) times daily. 05/19/19   Ladell Pier, MD  dexlansoprazole (DEXILANT) 60 MG capsule Take 1 capsule (60 mg total) by  mouth daily. 10/21/18   Ladell Pier, MD  diclofenac sodium (VOLTAREN) 1 % GEL Apply 4 g topically 4 (four) times daily. 02/27/19   Deno Etienne, DO  Ergocalciferol (VITAMIN D2) 10 MCG (400 UNIT) TABS Take 400 Units by mouth daily. 10/21/18   Ladell Pier, MD  fluticasone (FLONASE) 50 MCG/ACT nasal spray PLACE 2 SPRAYS IN EACH NOSTRIL DAILY AS NEEDED FOR ALLERGIES 03/28/19   Ladell Pier, MD  gabapentin (NEURONTIN) 300 MG capsule TAKE 2 CAPSULES BY MOUTH 2 TIMES DAILY. 01/17/19   Ladell Pier, MD  glucose blood (ACCU-CHEK AVIVA PLUS) test strip USE AS DIRECTED TO TEST TWICE DAILY 04/27/18   Ladell Pier, MD  Insulin Glargine (LANTUS SOLOSTAR) 100 UNIT/ML Solostar Pen Inject 84 Units into the skin daily. 01/19/19   Ladell Pier, MD  Insulin Pen Needle (TRUEPLUS PEN NEEDLES) 32G X 4 MM MISC Use to inject insulin. 10/13/18   Ladell Pier, MD  levothyroxine (SYNTHROID) 100 MCG tablet Take 1 tablet (100 mcg total) by mouth daily. 05/31/19   Ladell Pier, MD  lisinopril (PRINIVIL,ZESTRIL) 40 MG tablet Take 1 tablet (40 mg total) by mouth daily. 10/21/18   Ladell Pier, MD  mometasone (NASONEX) 50 MCG/ACT nasal spray Place 2 sprays into the nose daily. Patient taking differently: Place 2 sprays into the nose daily as needed (allergies).  08/24/17   Ladell Pier, MD  Multiple Vitamins-Minerals (EYE VITAMINS) CAPS Take 1 capsule by mouth daily. 08/11/12   Thurnell Lose, MD  NOVOLOG FLEXPEN 100 UNIT/ML FlexPen ADMINISTER 50 UNITS UNDER THE SKIN THREE TIMES DAILY WITH MEALS. 06/23/19   Ladell Pier, MD  NYSTATIN powder APPLY TOPICALLY 4 TIMES DAILY. Patient taking differently: Apply 1 Bottle topically 4 (four) times daily as needed (rash).  01/18/17   Langeland, Leda Quail, MD  ondansetron (ZOFRAN) 4 MG tablet Take 1 tablet (4 mg total) by mouth every 6 (six) hours. 09/16/18   Darlin Drop P, PA-C  phenazopyridine (PYRIDIUM) 200 MG tablet Take 1 tablet (200 mg  total) by mouth 3 (three) times daily. 07/17/19   Gareth Morgan, MD  sitaGLIPtin (JANUVIA) 50 MG tablet TAKE 1 TABLET (50 MG TOTAL) BY MOUTH DAILY. 10/21/18   Ladell Pier, MD  spironolactone (ALDACTONE) 25 MG tablet Take 25 mg, one tablet, in the morning and then 12.5 mg, half a tablet, in the afternoon. 10/21/18   Ladell Pier, MD  torsemide (DEMADEX) 20 MG tablet Take 1 tablet by mouth daily as needed (fluid).  05/09/18  [provider]  valACYclovir (VALTREX) 1000 MG tablet Take 1 tablet (1,000 mg total) by mouth 2 (two) times daily. 05/09/19   Charlott Rakes, MD  VICTOZA 18 MG/3ML SOPN INJECT 1.8 MG UNDER THE SKIN DAILY. 05/25/19   Ladell Pier, MD    Family History Family History  Problem Relation Age of Onset  . Cancer Mother        cervical- mets  . Breast cancer Mother   . Cancer Father   . Heart disease Father   . Diabetes Father   . Diabetes Sister   . Breast cancer Sister   . Heart disease Sister   . Breast cancer Sister   . Heart disease Sister   . Breast cancer Sister   . Breast cancer Sister   . COPD Sister   . Breast cancer Sister   . Colon cancer Neg Hx   . Esophageal cancer Neg Hx   . Rectal cancer Neg Hx   . Stomach cancer Neg Hx     Social History Social History   Tobacco Use  . Smoking status: Never Smoker  . Smokeless tobacco: Never Used  Substance Use Topics  . Alcohol use: No  . Drug use: No     Allergies   Amoxicillin-pot clavulanate, Hydrocodone-acetaminophen, Oxycodone, Sulfa antibiotics, Norvasc [amlodipine besylate], Sulfamethoxazole, and Tramadol   Review of Systems Review of Systems  Constitutional: Negative for fever.  HENT: Negative for sore throat.   Eyes: Negative for visual disturbance.  Respiratory: Negative for cough and shortness of breath.   Cardiovascular: Negative for chest pain.  Gastrointestinal: Negative for abdominal pain, nausea and vomiting.  Genitourinary: Positive for dysuria, frequency  and urgency. Negative for difficulty urinating.  Musculoskeletal: Positive for arthralgias. Negative for back pain and neck pain.  Skin: Negative for rash.  Neurological: Negative for syncope, light-headedness and headaches.     Physical Exam Updated Vital Signs BP (!) 165/80 (BP Location: Right Arm)   Pulse 84   Temp 98.2 F (36.8 C)   Resp (!) 22   Ht _0  (1.676 m)   Wt (!) 146.5 kg   SpO2 99%   BMI 52.13 kg/m   Physical Exam Vitals signs and nursing note reviewed.  Constitutional:      General: She is not in acute distress.    Appearance: She is well-developed. She is not diaphoretic.  HENT:     Head: Normocephalic and atraumatic.  Eyes:     Conjunctiva/sclera: Conjunctivae normal.  Neck:     Musculoskeletal: Normal range of motion.  Cardiovascular:     Rate and Rhythm: Normal rate and regular rhythm.  Pulmonary:     Effort: Pulmonary effort is normal. No respiratory distress.  Abdominal:     General: There is no distension.     Palpations: Abdomen is soft.     Tenderness: There is no abdominal tenderness. There is no guarding.  Musculoskeletal:        General: No tenderness.     Comments: Equal bilateral LE pulses No edema Full ROM knees/no erythema  Skin:    General: Skin is warm and dry.     Findings: No erythema or rash.  Neurological:     Mental Status: She is alert and oriented to person, place, and time.      ED Treatments / Results  Labs (all labs ordered are listed, but only abnormal results are displayed) Labs Reviewed  URINALYSIS, ROUTINE W REFLEX MICROSCOPIC - Abnormal; Notable for the following  components:      Result Value   APPearance CLOUDY (*)    Glucose, UA >=500 (*)    Hgb urine dipstick SMALL (*)    Protein, ur 30 (*)    Nitrite POSITIVE (*)    Leukocytes,Ua SMALL (*)    All other components within normal limits  URINALYSIS, MICROSCOPIC (REFLEX) - Abnormal; Notable for the following components:   Bacteria, UA MANY (*)    All  other components within normal limits    EKG None  Radiology No results found.  Procedures Procedures (including critical care time)  Medications Ordered in ED Medications  phenazopyridine (PYRIDIUM) tablet 100 mg (100 mg Oral Given 07/17/19 1845)  cephALEXin (KEFLEX) capsule 500 mg (500 mg Oral Given 07/17/19 1845)     Initial Impression / Assessment and Plan / ED Course  I have reviewed the triage vital signs and the nursing notes.  Pertinent labs & imaging results that were available during my care of the patient were reviewed by me and considered in my medical decision making (see chart for details).       68 year old female with a history of CHF, diabetes, hypertension, hyperlipidemia, OSA, presents with concern for dysuria.  Urinalysis is consistent with urinary tract infection, with many bacteria, leukocytes, positive nitrites which is also consistent with symptoms.  She is not having systemic symptoms, including no fevers, nausea, vomiting, flank pain or abdominal pain.  She is stable for outpatient treatment of urinary tract infection.  Given prescription for Keflex twice daily, recommend PCP follow-up, return to the emergency department for new or worsening symptoms.  She also reports bilateral knee pain on my history, with extension from the knee to area behind the knee.  Do not see signs of DVT, she has normal extremity pulses, no signs of acute arterial thrombus, and has no erythema, warmth, good range of motion of her knees and septic arthritis.  Suspect the symptoms may be secondary to osteoarthritis. Recommend tylenol.   Given prescription for Keflex, recommend PCP follow-up and return for worsening symptoms. Patient discharged in stable condition with understanding of reasons to return.    Final Clinical Impressions(s) / ED Diagnoses   Final diagnoses:  Lower urinary tract infectious disease  Acute pain of both knees    ED Discharge Orders         Ordered     cephALEXin (KEFLEX) 500 MG capsule  2 times daily     07/17/19 1831    phenazopyridine (PYRIDIUM) 200 MG tablet  3 times daily     07/17/19 1832           Gareth Morgan, MD 07/17/19 2359

## 2019-07-17 NOTE — ED Triage Notes (Addendum)
Pt c/o dysuria and swelling/pain to bilat LE x 3 days-pt entered triage with labored breathing carrying a large bag and clothing-states resp effort is her baseline-pt later with resp effort WNL after seated for triage

## 2019-07-21 ENCOUNTER — Other Ambulatory Visit: Payer: Self-pay | Admitting: Internal Medicine

## 2019-07-22 ENCOUNTER — Other Ambulatory Visit: Payer: Self-pay | Admitting: Internal Medicine

## 2019-07-22 DIAGNOSIS — E1140 Type 2 diabetes mellitus with diabetic neuropathy, unspecified: Secondary | ICD-10-CM

## 2019-07-22 DIAGNOSIS — E118 Type 2 diabetes mellitus with unspecified complications: Secondary | ICD-10-CM

## 2019-07-24 ENCOUNTER — Ambulatory Visit: Payer: Medicare HMO | Admitting: Internal Medicine

## 2019-07-25 ENCOUNTER — Telehealth: Payer: Self-pay | Admitting: Internal Medicine

## 2019-07-25 NOTE — Telephone Encounter (Signed)
Please fill if appropriate. Last seen in August. Refill runs out this month

## 2019-07-25 NOTE — Telephone Encounter (Signed)
Melissa from [employer] called to request an update on a fax she sent, its in regards to incontinence supplies. Please follow up if you need it resent  [phone]

## 2019-07-25 NOTE — Telephone Encounter (Signed)
We received forms. Dr. Wynetta Emery does paperwork on Wednesdays on admin day. Will fax once I receive back

## 2019-07-28 ENCOUNTER — Other Ambulatory Visit: Payer: Self-pay | Admitting: Pharmacist

## 2019-07-28 ENCOUNTER — Telehealth: Payer: Self-pay | Admitting: Internal Medicine

## 2019-07-28 DIAGNOSIS — IMO0002 Reserved for concepts with insufficient information to code with codable children: Secondary | ICD-10-CM

## 2019-07-28 DIAGNOSIS — E785 Hyperlipidemia, unspecified: Secondary | ICD-10-CM

## 2019-07-28 DIAGNOSIS — E1140 Type 2 diabetes mellitus with diabetic neuropathy, unspecified: Secondary | ICD-10-CM

## 2019-07-28 DIAGNOSIS — E890 Postprocedural hypothyroidism: Secondary | ICD-10-CM

## 2019-07-28 MED ORDER — LEVOTHYROXINE SODIUM 100 MCG PO TABS: 100.0000 ug | ORAL_TABLET | Freq: Every day | ORAL | 2 refills | Status: DC

## 2019-07-28 MED ORDER — NOVOLOG FLEXPEN 100 UNIT/ML ~~LOC~~ SOPN: PEN_INJECTOR | SUBCUTANEOUS | 1 refills | Status: DC

## 2019-07-28 MED ORDER — VICTOZA 18 MG/3ML ~~LOC~~ SOPN: PEN_INJECTOR | SUBCUTANEOUS | 2 refills | Status: DC

## 2019-07-28 MED ORDER — ATORVASTATIN CALCIUM 40 MG PO TABS: ORAL_TABLET | ORAL | 0 refills | Status: DC

## 2019-07-28 MED ORDER — SITAGLIPTIN PHOSPHATE 50 MG PO TABS: ORAL_TABLET | ORAL | 2 refills | Status: DC

## 2019-07-28 NOTE — Telephone Encounter (Signed)
Pt requesting refills but I'm not sure which ones exactly. Of note, she no-showed 07/24/19.

## 2019-07-28 NOTE — Telephone Encounter (Signed)
Fill all medication per Audree Bane will speak to you

## 2019-08-02 DIAGNOSIS — N3946 Mixed incontinence: Secondary | ICD-10-CM | POA: Diagnosis not present

## 2019-08-02 DIAGNOSIS — N319 Neuromuscular dysfunction of bladder, unspecified: Secondary | ICD-10-CM | POA: Diagnosis not present

## 2019-08-07 ENCOUNTER — Other Ambulatory Visit: Payer: Self-pay | Admitting: Internal Medicine

## 2019-08-08 ENCOUNTER — Telehealth: Payer: Self-pay | Admitting: Internal Medicine

## 2019-08-08 NOTE — Telephone Encounter (Signed)
Patient will need to contact her pharmacy and have them contact us regarding specific medications. We can not fill "all medication".

## 2019-08-08 NOTE — Telephone Encounter (Signed)
Patient called requesting a refill on all her current medication. Patient uses [id number] - [place], [place] - [address] AT [address]  Patient states she is out of medication.

## 2019-08-27 ENCOUNTER — Other Ambulatory Visit: Payer: Self-pay | Admitting: Internal Medicine

## 2019-08-29 DIAGNOSIS — N3941 Urge incontinence: Secondary | ICD-10-CM | POA: Diagnosis not present

## 2019-08-29 DIAGNOSIS — R32 Unspecified urinary incontinence: Secondary | ICD-10-CM | POA: Diagnosis not present

## 2019-08-29 DIAGNOSIS — F0390 Unspecified dementia without behavioral disturbance: Secondary | ICD-10-CM | POA: Diagnosis not present

## 2019-09-01 DIAGNOSIS — N319 Neuromuscular dysfunction of bladder, unspecified: Secondary | ICD-10-CM | POA: Diagnosis not present

## 2019-09-01 DIAGNOSIS — N3946 Mixed incontinence: Secondary | ICD-10-CM | POA: Diagnosis not present

## 2019-09-25 ENCOUNTER — Other Ambulatory Visit: Payer: Self-pay | Admitting: Internal Medicine

## 2019-09-25 DIAGNOSIS — IMO0002 Reserved for concepts with insufficient information to code with codable children: Secondary | ICD-10-CM

## 2019-09-25 DIAGNOSIS — E1140 Type 2 diabetes mellitus with diabetic neuropathy, unspecified: Secondary | ICD-10-CM

## 2019-09-26 ENCOUNTER — Other Ambulatory Visit: Payer: Self-pay | Admitting: Internal Medicine

## 2019-09-26 DIAGNOSIS — F0390 Unspecified dementia without behavioral disturbance: Secondary | ICD-10-CM | POA: Diagnosis not present

## 2019-09-26 DIAGNOSIS — N3941 Urge incontinence: Secondary | ICD-10-CM | POA: Diagnosis not present

## 2019-09-26 DIAGNOSIS — R32 Unspecified urinary incontinence: Secondary | ICD-10-CM | POA: Diagnosis not present

## 2019-10-04 ENCOUNTER — Other Ambulatory Visit: Payer: Self-pay | Admitting: Internal Medicine

## 2019-10-13 ENCOUNTER — Other Ambulatory Visit: Payer: Self-pay

## 2019-10-13 ENCOUNTER — Ambulatory Visit: Payer: Medicare HMO | Attending: Internal Medicine | Admitting: Internal Medicine

## 2019-10-13 DIAGNOSIS — Z1211 Encounter for screening for malignant neoplasm of colon: Secondary | ICD-10-CM

## 2019-10-13 DIAGNOSIS — E890 Postprocedural hypothyroidism: Secondary | ICD-10-CM | POA: Diagnosis not present

## 2019-10-13 DIAGNOSIS — I5032 Chronic diastolic (congestive) heart failure: Secondary | ICD-10-CM

## 2019-10-13 DIAGNOSIS — Z6841 Body Mass Index (BMI) 40.0 and over, adult: Secondary | ICD-10-CM | POA: Diagnosis not present

## 2019-10-13 DIAGNOSIS — E785 Hyperlipidemia, unspecified: Secondary | ICD-10-CM

## 2019-10-13 DIAGNOSIS — Z794 Long term (current) use of insulin: Secondary | ICD-10-CM | POA: Diagnosis not present

## 2019-10-13 DIAGNOSIS — E1142 Type 2 diabetes mellitus with diabetic polyneuropathy: Secondary | ICD-10-CM

## 2019-10-13 DIAGNOSIS — I10 Essential (primary) hypertension: Secondary | ICD-10-CM | POA: Diagnosis not present

## 2019-10-13 DIAGNOSIS — L918 Other hypertrophic disorders of the skin: Secondary | ICD-10-CM

## 2019-10-13 MED ORDER — GABAPENTIN 300 MG PO CAPS: ORAL_CAPSULE | ORAL | 6 refills | Status: DC

## 2019-10-13 MED ORDER — CARVEDILOL 12.5 MG PO TABS: 18.7500 mg | ORAL_TABLET | Freq: Two times a day (BID) | ORAL | 3 refills | Status: DC

## 2019-10-13 MED ORDER — SPIRONOLACTONE 25 MG PO TABS: ORAL_TABLET | ORAL | 3 refills | Status: DC

## 2019-10-13 MED ORDER — LEVOTHYROXINE SODIUM 100 MCG PO TABS: 100.0000 ug | ORAL_TABLET | Freq: Every day | ORAL | 6 refills | Status: AC

## 2019-10-13 MED ORDER — VICTOZA 18 MG/3ML ~~LOC~~ SOPN: PEN_INJECTOR | SUBCUTANEOUS | 5 refills | Status: DC

## 2019-10-13 MED ORDER — NOVOLOG FLEXPEN 100 UNIT/ML ~~LOC~~ SOPN: 60.0000 [IU] | PEN_INJECTOR | Freq: Three times a day (TID) | SUBCUTANEOUS | 11 refills | Status: DC

## 2019-10-13 MED ORDER — ATORVASTATIN CALCIUM 40 MG PO TABS: ORAL_TABLET | ORAL | 1 refills | Status: DC

## 2019-10-13 MED ORDER — LANTUS SOLOSTAR 100 UNIT/ML ~~LOC~~ SOPN: 84.0000 [IU] | PEN_INJECTOR | Freq: Every day | SUBCUTANEOUS | 11 refills | Status: AC

## 2019-10-13 NOTE — Progress Notes (Signed)
Virtual Visit via Telephone Note Due to current restrictions/limitations of in-office visits due to the COVID-19 pandemic, this scheduled clinical appointment was converted to a telehealth visit  I connected with Anita James on 10/13/19 at 4:15 p.m by telephone and verified that I am speaking with the correct person using two identifiers. I am in my office.  The patient is at home.  Only the patient and myself participated in this encounter.  I discussed the limitations, risks, security and privacy concerns of performing an evaluation and management service by telephone and the availability of in person appointments. I also discussed with the patient that there may be a patient responsible charge related to this service. The patient expressed understanding and agreed to proceed.  History of Present Illness: Patient with history of diabetes type 2with peripheral neuropathy, hypertension, hypothyroidism, obstructive sleep apnea on CPAP, morbid obesity, diastolic CHF with EF of 95-28%.Urinary incontinence. Last eval 12/2018  Obesity: reports she is steadily gaining wgh.  Last wgh in the system 323 lbs.  Thinks she weighs even more now.  She feels she is eating less.  States she was supposed to see a specialist last year about having weight reduction surgery but things got put off due to the Covid pandemic.  Complains of having irritating skin tags around her neck and the right breast.  The ones around the neck are most irritating because they get caught in her close.  She would like to see about having them removed.  DM: Needing refills on her insulin and Victoza.  Last saw the endocrinologist Dr. Tamala Julian in May of last year.  She states she had a recent appointment but had to cancel it.  She plans to call them back and reschedule when she is ready.  She has not been checking blood sugars.  She states that it gets too aggravating.  Last A1c was 6.5 in August of last year.  She is needing to have an A1c  done.  She states that she plans to call Dr. Thompson Caul office to see if he can order it for her and have it done at the lab there because it is closer to her than having to come here. Taking 84 units of Lantus, 60 units Novolog TID, Victozia.  Not on Januvia.  She is not sure who discontinue it. Over due for eye exam.  Agreeable to referral   HTN/CHF: compliant with meds and salt restriction Last reading 120s/70s Some swelling in legs. Takes Torsemide PRN  She is needing refills on all of her medications including levothyroxine for her thyroid. Outpatient Encounter Medications as of 10/13/2019  Medication Sig  . Accu-Chek FastClix Lancets MISC USE AS DIRECTED TO CHECK BLOOD SUGAR THREE TIMES DAILY  . ACCU-CHEK GUIDE test strip USE AS DIRECTED TO CHECK BLOOD SUGAR THREE TIMES DAILY  . acetaminophen (TYLENOL) 500 MG tablet Take 1 tablet (500 mg total) by mouth every 6 (six) hours as needed.  Marland Kitchen aspirin EC 81 MG tablet Take 81 mg by mouth daily.  Marland Kitchen atorvastatin (LIPITOR) 40 MG tablet TAKE 1 TABLET BY MOUTH DAILY AT 6 PM  . Blood Glucose Monitoring Suppl (ACCU-CHEK AVIVA PLUS) w/Device KIT USE AS DIRECTED TO TEST TWICE DAILY  . carvedilol (COREG) 12.5 MG tablet Take 1.5 tablets (18.75 mg total) by mouth 2 (two) times daily with a meal.  . Cetirizine HCl 10 MG CAPS Take 1 capsule (10 mg total) by mouth daily.  . ciprofloxacin (CIPRO) 500 MG tablet Take 1 tablet (500  mg total) by mouth 2 (two) times daily.  Marland Kitchen dexlansoprazole (DEXILANT) 60 MG capsule Take 1 capsule (60 mg total) by mouth daily.  . diclofenac sodium (VOLTAREN) 1 % GEL Apply 4 g topically 4 (four) times daily.  . Ergocalciferol (VITAMIN D2) 10 MCG (400 UNIT) TABS Take 400 Units by mouth daily.  . fluticasone (FLONASE) 50 MCG/ACT nasal spray PLACE 2 SPRAYS IN EACH NOSTRIL DAILY AS NEEDED FOR ALLERGIES  . gabapentin (NEURONTIN) 300 MG capsule TAKE 2 CAPSULES BY MOUTH TWICE DAILY  . Insulin Glargine (LANTUS SOLOSTAR) 100 UNIT/ML Solostar  Pen Inject 84 Units into the skin daily.  . Insulin Pen Needle (TRUEPLUS PEN NEEDLES) 32G X 4 MM MISC Use to inject insulin.  Marland Kitchen levothyroxine (SYNTHROID) 100 MCG tablet Take 1 tablet (100 mcg total) by mouth daily.  Marland Kitchen liraglutide (VICTOZA) 18 MG/3ML SOPN Inject 1.8 mg into the skin daily.  Marland Kitchen lisinopril (PRINIVIL,ZESTRIL) 40 MG tablet Take 1 tablet (40 mg total) by mouth daily.  . mometasone (NASONEX) 50 MCG/ACT nasal spray Place 2 sprays into the nose daily. (Patient taking differently: Place 2 sprays into the nose daily as needed (allergies). )  . Multiple Vitamins-Minerals (EYE VITAMINS) CAPS Take 1 capsule by mouth daily.  Marland Kitchen NOVOLOG FLEXPEN 100 UNIT/ML FlexPen ADMINISTER 50 UNITS UNDER THE SKIN THREE TIMES DAILY WITH MEALS  . NYSTATIN powder APPLY TOPICALLY 4 TIMES DAILY. (Patient taking differently: Apply 1 Bottle topically 4 (four) times daily as needed (rash). )  . ondansetron (ZOFRAN) 4 MG tablet Take 1 tablet (4 mg total) by mouth every 6 (six) hours.  . phenazopyridine (PYRIDIUM) 200 MG tablet Take 1 tablet (200 mg total) by mouth 3 (three) times daily.  . sitaGLIPtin (JANUVIA) 50 MG tablet TAKE 1 TABLET (50 MG TOTAL) BY MOUTH DAILY.  Marland Kitchen spironolactone (ALDACTONE) 25 MG tablet TAKE 1 TABLET BY MOUTH EVERY MORNING AND 1/2 TABLET BY MOUTH EVERY EVENING  . torsemide (DEMADEX) 20 MG tablet Take 1 tablet by mouth daily as needed (fluid).   . valACYclovir (VALTREX) 1000 MG tablet Take 1 tablet (1,000 mg total) by mouth 2 (two) times daily.   No facility-administered encounter medications on file as of 10/13/2019.    Observations/Objective:    Chemistry      Component Value Date/Time   NA 145 (H) 05/09/2019 1440   NA 138 06/21/2013 0438   K 4.4 05/09/2019 1440   K 3.8 06/21/2013 0438   CL 106 05/09/2019 1440   CL 105 06/21/2013 0438   CO2 27 05/09/2019 1440   CO2 26 06/21/2013 0438   BUN 17 05/09/2019 1440   BUN 7 06/21/2013 0438   CREATININE 0.90 05/09/2019 1440   CREATININE 0.97  11/25/2016 1115      Component Value Date/Time   CALCIUM 8.9 05/09/2019 1440   CALCIUM 8.9 06/21/2013 0438   ALKPHOS 74 05/09/2019 1440   ALKPHOS 69 06/21/2013 0438   AST 12 05/09/2019 1440   AST 33 06/21/2013 0438   ALT 17 05/09/2019 1440   ALT 29 06/21/2013 0438   BILITOT 0.4 05/09/2019 1440   BILITOT 0.5 06/21/2013 0438     Lab Results  Component Value Date   WBC 5.8 05/09/2019   HGB 12.0 05/09/2019   HCT 34.9 05/09/2019   MCV 88 05/09/2019   PLT 226 05/09/2019   Lab Results  Component Value Date   HGBA1C 6.5 05/09/2019    Assessment and Plan: .1. Type 2 diabetes mellitus with diabetic polyneuropathy, with long-term current use  of insulin ([place]) Patient will continue current dose of Lantus, Victoza and NovoLog.  Encouraged her to check her blood sugars if only once a day.  Encouraged her to call and reschedule her appointment with her endocrinologist - Ambulatory referral to Ophthalmology - liraglutide (VICTOZA) 18 MG/3ML SOPN; Inject 1.8 mg into the skin daily.  Dispense: 9 mL; Refill: 5 - gabapentin (NEURONTIN) 300 MG capsule; TAKE 2 CAPSULES BY MOUTH TWICE DAILY  Dispense: 120 capsule; Refill: 6  2. Class 3 severe obesity due to excess calories with serious comorbidity and body mass index (BMI) of 50.0 to 59.9 in adult [facility]) Dietary counseling given.  She is agreeable to referral to medical weight management - Amb Ref to Medical Weight Management  3. Essential hypertension Reported blood pressure are at goal.  Continue current medications  4. Chronic diastolic congestive heart failure (HCC) Continue current medications and low-salt diet  5. Postoperative hypothyroidism  - levothyroxine (SYNTHROID) 100 MCG tablet; Take 1 tablet (100 mcg total) by mouth daily.  Dispense: 30 tablet; Refill: 6  6. Skin tags, multiple acquired - Ambulatory referral to Dermatology  7. Hyperlipidemia, unspecified hyperlipidemia type - atorvastatin (LIPITOR) 40 MG tablet; TAKE 1  TABLET BY MOUTH DAILY AT 6 PM  Dispense: 90 tablet; Refill: 1  8. Colon cancer screening - Fecal occult blood, imunochemical([facility]); Future   Follow Up Instructions: 2 mths   I discussed the assessment and treatment plan with the patient. The patient was provided an opportunity to ask questions and all were answered. The patient agreed with the plan and demonstrated an understanding of the instructions.   The patient was advised to call back or seek an in-person evaluation if the symptoms worsen or if the condition fails to improve as anticipated.  I provided 14 minutes of non-face-to-face time during this encounter.   Karle Plumber, MD

## 2019-10-13 NOTE — Progress Notes (Signed)
Pt states she has skin tags all over her breast

## 2019-10-18 ENCOUNTER — Telehealth: Payer: Self-pay | Admitting: Internal Medicine

## 2019-10-18 NOTE — Telephone Encounter (Signed)
Attempted to call patient to schedule fu visit. LVM. Patient needs a 38mth fu with pcp.

## 2019-10-23 ENCOUNTER — Telehealth: Payer: Self-pay | Admitting: Internal Medicine

## 2019-10-23 NOTE — Telephone Encounter (Signed)
Patient called and requested to speak with Jay'a her nurse. Please fu at your earliest convenience.

## 2019-10-24 DIAGNOSIS — N3941 Urge incontinence: Secondary | ICD-10-CM | POA: Diagnosis not present

## 2019-10-24 DIAGNOSIS — R32 Unspecified urinary incontinence: Secondary | ICD-10-CM | POA: Diagnosis not present

## 2019-10-24 DIAGNOSIS — F0390 Unspecified dementia without behavioral disturbance: Secondary | ICD-10-CM | POA: Diagnosis not present

## 2019-10-24 NOTE — Telephone Encounter (Signed)
Returned pt call. Pt states she received a letter regarding her Novolog insulin. Pt states the letter states something about prior approval. Made pt aware that her insulin is probably needing a prior auth and I will send a message to the [place] in the pharmacy that does prior auths. Pt doesn't have any other questions or concerns

## 2019-11-06 ENCOUNTER — Other Ambulatory Visit: Payer: Self-pay | Admitting: Internal Medicine

## 2019-11-10 ENCOUNTER — Other Ambulatory Visit: Payer: Self-pay | Admitting: Internal Medicine

## 2019-11-10 DIAGNOSIS — I10 Essential (primary) hypertension: Secondary | ICD-10-CM

## 2019-11-11 DIAGNOSIS — L918 Other hypertrophic disorders of the skin: Secondary | ICD-10-CM | POA: Diagnosis not present

## 2019-11-11 DIAGNOSIS — L821 Other seborrheic keratosis: Secondary | ICD-10-CM | POA: Diagnosis not present

## 2019-11-14 ENCOUNTER — Emergency Department (HOSPITAL_BASED_OUTPATIENT_CLINIC_OR_DEPARTMENT_OTHER)
Admission: EM | Admit: 2019-11-14 | Discharge: 2019-11-14 | Disposition: A | Discharge status: Home / Self Care | Payer: Medicare HMO | Attending: Emergency Medicine | Admitting: Emergency Medicine

## 2019-11-14 ENCOUNTER — Encounter (HOSPITAL_BASED_OUTPATIENT_CLINIC_OR_DEPARTMENT_OTHER): Payer: Self-pay | Admitting: *Deleted

## 2019-11-14 ENCOUNTER — Other Ambulatory Visit: Payer: Self-pay

## 2019-11-14 DIAGNOSIS — Z7982 Long term (current) use of aspirin: Secondary | ICD-10-CM | POA: Insufficient documentation

## 2019-11-14 DIAGNOSIS — Z794 Long term (current) use of insulin: Secondary | ICD-10-CM | POA: Insufficient documentation

## 2019-11-14 DIAGNOSIS — Z79899 Other long term (current) drug therapy: Secondary | ICD-10-CM | POA: Insufficient documentation

## 2019-11-14 DIAGNOSIS — I5032 Chronic diastolic (congestive) heart failure: Secondary | ICD-10-CM | POA: Insufficient documentation

## 2019-11-14 DIAGNOSIS — E1122 Type 2 diabetes mellitus with diabetic chronic kidney disease: Secondary | ICD-10-CM | POA: Insufficient documentation

## 2019-11-14 DIAGNOSIS — E1140 Type 2 diabetes mellitus with diabetic neuropathy, unspecified: Secondary | ICD-10-CM | POA: Diagnosis not present

## 2019-11-14 DIAGNOSIS — E039 Hypothyroidism, unspecified: Secondary | ICD-10-CM | POA: Insufficient documentation

## 2019-11-14 DIAGNOSIS — N189 Chronic kidney disease, unspecified: Secondary | ICD-10-CM | POA: Diagnosis not present

## 2019-11-14 DIAGNOSIS — I130 Hypertensive heart and chronic kidney disease with heart failure and stage 1 through stage 4 chronic kidney disease, or unspecified chronic kidney disease: Secondary | ICD-10-CM | POA: Insufficient documentation

## 2019-11-14 DIAGNOSIS — N3000 Acute cystitis without hematuria: Secondary | ICD-10-CM | POA: Insufficient documentation

## 2019-11-14 DIAGNOSIS — R300 Dysuria: Secondary | ICD-10-CM | POA: Diagnosis present

## 2019-11-14 LAB — URINALYSIS, ROUTINE W REFLEX MICROSCOPIC
Bilirubin Urine: NEGATIVE
Glucose, UA: 250 mg/dL — AB
Ketones, ur: NEGATIVE mg/dL
Nitrite: POSITIVE — AB
Protein, ur: 100 mg/dL — AB
Specific Gravity, Urine: 1.025 (ref 1.005–1.030)
pH: 6 (ref 5.0–8.0)

## 2019-11-14 LAB — URINALYSIS, MICROSCOPIC (REFLEX): WBC, UA: >50 WBC/hpf (ref 0–5)

## 2019-11-14 MED ORDER — CIPROFLOXACIN HCL 500 MG PO TABS
500.0000 mg | ORAL_TABLET | Freq: Once | ORAL | Status: AC
  Administered 2019-11-14: 20:00:00 500 mg via ORAL
  Filled 2019-11-14: qty 1

## 2019-11-14 MED ORDER — CIPROFLOXACIN HCL 500 MG PO TABS: 500.0000 mg | ORAL_TABLET | Freq: Two times a day (BID) | ORAL | 0 refills | Status: DC

## 2019-11-14 NOTE — Discharge Instructions (Signed)
Take the antibiotic Cipro as directed.  Would expect significant improvement in the next 24 to 48 hours.  Return for new or worse symptoms or if not improving.  Urine sent for culture to confirm what type of bug is growing.

## 2019-11-14 NOTE — ED Notes (Signed)
ED Provider at bedside. 

## 2019-11-14 NOTE — ED Triage Notes (Signed)
She feels like her bladder is falling out. Dysuria, frequency.

## 2019-11-14 NOTE — ED Provider Notes (Signed)
[facility] EMERGENCY DEPARTMENT Provider Note   CSN: [id number] Arrival date & time: 11/14/19  1842     History Chief Complaint  Patient presents with  . Dysuria    Anita James is a 69 y.o. female.  Patient presents with concerns about dysuria and frequency.  She has had frequent urinary tract infections in the past.  None recently.  Last seen for urinary tract infection in October.  Patient has an allergy to Augmentin.        Past Medical History:  Diagnosis Date  . Anxiety   . Carpal tunnel syndrome 06/14/2014  . Chest pain   . CHF (congestive heart failure) ([place])   . Chronic back pain   . Chronic kidney disease 03/2013   failure due to meds  . Diabetes mellitus type II, uncontrolled ([place])   . Diverticulitis   . Frequent headaches   . Hiatal hernia   . Hyperlipemia   . Hypertension   . Hypothyroidism   . 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 Neuropathy   . Obesity   . Pneumonia   . Vitamin D deficiency 06/05/2014    Patient Active Problem List   Diagnosis Date Noted  . OSA on CPAP 05/19/2017  . Morbid obesity due to excess calories ([place]) 03/17/2017  . Neuropathic pain 11/04/2016  . Carpal tunnel syndrome 06/14/2014  . 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 Cervical radiculopathy at C8 06/05/2014  . Hyperlipemia 09/12/2013  . Diastolic CHF ([place]) 27/03/8674  . Major depression 09/12/2013  . Allergic rhinitis 06/22/2013  . Chronic pain syndrome 05/31/2013  . Gastroesophageal reflux disease without esophagitis 08/30/2012  . Essential hypertension   . Hypothyroidism   . Type 2 diabetes, uncontrolled, with neuropathy [facility])     Past Surgical History:  Procedure Laterality Date  . CESAREAN SECTION    . KNEE SURGERY Left   . THYROID SURGERY     goiter     OB History    Gravida  4   Para  3   Term  2   Preterm  1   AB  1   Living  6     SAB      TAB      Ectopic  1   Multiple  2   Live Births  5           Family History    Problem Relation Age of Onset  . Cancer Mother        cervical- mets  . Breast cancer Mother   . Cancer Father   . Heart disease Father   . Diabetes Father   . Diabetes Sister   . Breast cancer Sister   . Heart disease Sister   . Breast cancer Sister   . Heart disease Sister   . Breast cancer Sister   . Breast cancer Sister   . COPD Sister   . Breast cancer Sister   . Colon cancer Neg Hx   . Esophageal cancer Neg Hx   . Rectal cancer Neg Hx   . Stomach cancer Neg Hx     Social History   Tobacco Use  . Smoking status: Never Smoker  . Smokeless tobacco: Never Used  Substance Use Topics  . Alcohol use: No  . Drug use: No    Home Medications Prior to Admission medications   Medication Sig Start Date End Date Taking? Authorizing Provider  Accu-Chek FastClix Lancets MISC USE AS DIRECTED TO CHECK BLOOD  SUGAR THREE TIMES DAILY 11/07/19   Ladell Pier, MD  ACCU-CHEK GUIDE test strip USE AS DIRECTED TO CHECK BLOOD SUGAR THREE TIMES DAILY 10/04/19   Ladell Pier, MD  acetaminophen (TYLENOL) 500 MG tablet Take 1 tablet (500 mg total) by mouth every 6 (six) hours as needed. 09/16/18   Darlin Drop P, PA-C  aspirin EC 81 MG tablet Take 81 mg by mouth daily. 11/20/16   [provider]  atorvastatin (LIPITOR) 40 MG tablet TAKE 1 TABLET BY MOUTH DAILY AT 6 PM 10/13/19   Ladell Pier, MD  Blood Glucose Monitoring Suppl (ACCU-CHEK AVIVA PLUS) w/Device KIT USE AS DIRECTED TO TEST TWICE DAILY 04/27/18   Ladell Pier, MD  carvedilol (COREG) 12.5 MG tablet Take 1.5 tablets (18.75 mg total) by mouth 2 (two) times daily with a meal. 10/13/19   Ladell Pier, MD  Cetirizine HCl 10 MG CAPS Take 1 capsule (10 mg total) by mouth daily. 10/21/18   Ladell Pier, MD  ciprofloxacin (CIPRO) 500 MG tablet Take 1 tablet (500 mg total) by mouth 2 (two) times daily. 11/14/19   Fredia Sorrow, MD  dexlansoprazole (DEXILANT) 60 MG capsule Take 1 capsule (60 mg total) by  mouth daily. 10/21/18   Ladell Pier, MD  diclofenac sodium (VOLTAREN) 1 % GEL Apply 4 g topically 4 (four) times daily. 02/27/19   Deno Etienne, DO  Ergocalciferol (VITAMIN D2) 10 MCG (400 UNIT) TABS Take 400 Units by mouth daily. 10/21/18   Ladell Pier, MD  fluticasone (FLONASE) 50 MCG/ACT nasal spray PLACE 2 SPRAYS IN EACH NOSTRIL DAILY AS NEEDED FOR ALLERGIES 07/21/19   Ladell Pier, MD  gabapentin (NEURONTIN) 300 MG capsule TAKE 2 CAPSULES BY MOUTH TWICE DAILY 10/13/19   Ladell Pier, MD  insulin aspart (NOVOLOG FLEXPEN) 100 UNIT/ML FlexPen Inject 60 Units into the skin 3 (three) times daily with meals. 10/13/19   Ladell Pier, MD  Insulin Glargine (LANTUS SOLOSTAR) 100 UNIT/ML Solostar Pen Inject 84 Units into the skin daily. 10/13/19   Ladell Pier, MD  Insulin Pen Needle (TRUEPLUS PEN NEEDLES) 32G X 4 MM MISC Use to inject insulin. 10/13/18   Ladell Pier, MD  levothyroxine (SYNTHROID) 100 MCG tablet Take 1 tablet (100 mcg total) by mouth daily. 10/13/19   Ladell Pier, MD  liraglutide (VICTOZA) 18 MG/3ML SOPN Inject 1.8 mg into the skin daily. 10/13/19   Ladell Pier, MD  lisinopril (ZESTRIL) 40 MG tablet TAKE 1 TABLET BY MOUTH DAILY 11/10/19   Ladell Pier, MD  mometasone (NASONEX) 50 MCG/ACT nasal spray Place 2 sprays into the nose daily. Patient taking differently: Place 2 sprays into the nose daily as needed (allergies).  08/24/17   Ladell Pier, MD  Multiple Vitamins-Minerals (EYE VITAMINS) CAPS Take 1 capsule by mouth daily. 08/11/12   Thurnell Lose, MD  NYSTATIN powder APPLY TOPICALLY 4 TIMES DAILY. Patient taking differently: Apply 1 Bottle topically 4 (four) times daily as needed (rash).  01/18/17   Langeland, Leda Quail, MD  ondansetron (ZOFRAN) 4 MG tablet Take 1 tablet (4 mg total) by mouth every 6 (six) hours. 09/16/18   Darlin Drop P, PA-C  phenazopyridine (PYRIDIUM) 200 MG tablet Take 1 tablet (200 mg total) by mouth 3  (three) times daily. 07/17/19   Gareth Morgan, MD  spironolactone (ALDACTONE) 25 MG tablet TAKE 1 TABLET BY MOUTH EVERY MORNING AND 1/2 TABLET BY MOUTH EVERY EVENING 10/13/19  Ladell Pier, MD  torsemide (DEMADEX) 20 MG tablet Take 1 tablet by mouth daily as needed (fluid).  05/09/18   [provider]  valACYclovir (VALTREX) 1000 MG tablet Take 1 tablet (1,000 mg total) by mouth 2 (two) times daily. 05/09/19   Charlott Rakes, MD    Allergies    Amoxicillin-pot clavulanate, Hydrocodone-acetaminophen, Oxycodone, Sulfa antibiotics, Norvasc [amlodipine besylate], Sulfamethoxazole, and Tramadol  Review of Systems   Review of Systems  Constitutional: Negative for chills and fever.  HENT: Negative for congestion, rhinorrhea and sore throat.   Eyes: Negative for visual disturbance.  Respiratory: Negative for cough and shortness of breath.   Cardiovascular: Negative for chest pain and leg swelling.  Gastrointestinal: Negative for abdominal pain, diarrhea, nausea and vomiting.  Genitourinary: Positive for dysuria and frequency.  Musculoskeletal: Negative for back pain and neck pain.  Skin: Negative for rash.  Neurological: Negative for dizziness, light-headedness and headaches.  Hematological: Does not bruise/bleed easily.  Psychiatric/Behavioral: Negative for confusion.    Physical Exam Updated Vital Signs BP (!) 175/97   Pulse (!) 114   Temp 98.7 F (37.1 C) (Oral)   Resp 20   Ht 1.676 m ('5\' 6"' )   Wt (!) 146.5 kg   SpO2 93%   BMI 52.13 kg/m   Physical Exam Vitals and nursing note reviewed.  Constitutional:      General: She is not in acute distress.    Appearance: Normal appearance. She is well-developed.  HENT:     Head: Normocephalic and atraumatic.  Eyes:     Extraocular Movements: Extraocular movements intact.     Conjunctiva/sclera: Conjunctivae normal.     Pupils: Pupils are equal, round, and reactive to light.  Cardiovascular:     Rate and Rhythm:  Normal rate and regular rhythm.     Heart sounds: No murmur.  Pulmonary:     Effort: Pulmonary effort is normal. No respiratory distress.     Breath sounds: Normal breath sounds.  Abdominal:     Palpations: Abdomen is soft.     Tenderness: There is no abdominal tenderness.  Musculoskeletal:        General: Normal range of motion.     Cervical back: Normal range of motion and neck supple.  Skin:    General: Skin is warm and dry.  Neurological:     General: No focal deficit present.     Mental Status: She is alert and oriented to person, place, and time.     ED Results / Procedures / Treatments   Labs (all labs ordered are listed, but only abnormal results are displayed) Labs Reviewed  URINALYSIS, ROUTINE W REFLEX MICROSCOPIC - Abnormal; Notable for the following components:      Result Value   APPearance CLOUDY (*)    Glucose, UA 250 (*)    Hgb urine dipstick LARGE (*)    Protein, ur 100 (*)    Nitrite POSITIVE (*)    Leukocytes,Ua MODERATE (*)    All other components within normal limits  URINALYSIS, MICROSCOPIC (REFLEX) - Abnormal; Notable for the following components:   Bacteria, UA MANY (*)    All other components within normal limits  URINE CULTURE    EKG None  Radiology No results found.  Procedures Procedures (including critical care time)  Medications Ordered in ED Medications  ciprofloxacin (CIPRO) tablet 500 mg (500 mg Oral Given 11/14/19 1955)    ED Course  I have reviewed the triage vital signs and the nursing notes.  Pertinent labs & imaging results that were available during my care of the patient were reviewed by me and considered in my medical decision making (see chart for details).    MDM Rules/Calculators/A&P                        Patient with a known history of hypertension vital signs show the blood pressure is elevated here today.  She will follow her blood pressures when she is feeling better.  Urinalysis consistent with urinary  tract infection positive nitrite.  Urine sent for culture.  Will give first dose of Cipro here and then continue Cipro.  Patient will return if not improving in the next 1 to 2 days.  Or for any new or worse symptoms.   Patient nontoxic no acute distress here.   Final Clinical Impression(s) / ED Diagnoses Final diagnoses:  Acute cystitis without hematuria    Rx / DC Orders ED Discharge Orders         Ordered    ciprofloxacin (CIPRO) 500 MG tablet  2 times daily     11/14/19 1959           Fredia Sorrow, MD 11/14/19 2005

## 2019-11-16 LAB — URINE CULTURE: Culture: >=100000 — AB

## 2019-11-17 ENCOUNTER — Telehealth: Payer: Self-pay | Admitting: *Deleted

## 2019-11-17 NOTE — Telephone Encounter (Signed)
Post ED Visit - Positive Culture Follow-up  Culture report reviewed by antimicrobial stewardship pharmacist: [place] Team []  Elenor Quinones, Pharm.D. []  Heide Guile, Pharm.D., BCPS AQ-ID []  Parks Neptune, Pharm.D., BCPS []  Alycia Rossetti, Pharm.D., BCPS []  [place], [place].D., BCPS, AAHIVP []  Legrand Como, Pharm.D., BCPS, AAHIVP []  Salome Arnt, PharmD, BCPS []  Johnnette Gourd, PharmD, BCPS []  Hughes Better, PharmD, BCPS []  Leeroy Cha, PharmD []  Laqueta Linden, PharmD, BCPS []  Albertina Parr, PharmD Cristela Felt, PharmD  [place] Team []  Leodis Sias, PharmD []  Lindell Spar, PharmD []  Royetta Asal, PharmD []  Graylin Shiver, Rph []  Rema Fendt) Glennon Mac, PharmD []  Arlyn Dunning, PharmD []  Netta Cedars, PharmD []  Dia Sitter, PharmD []  Leone Haven, PharmD []  Gretta Arab, PharmD []  Theodis Shove, PharmD []  Peggyann Juba, PharmD []  Reuel Boom, PharmD   Positive urine culture Treated with Ciprofloxacin HCL, organism sensitive to the same and no further patient follow-up is required at this time.  Harlon Flor [facility] 11/17/2019, 10:34 AM

## 2019-11-18 DIAGNOSIS — R5383 Other fatigue: Secondary | ICD-10-CM | POA: Diagnosis not present

## 2019-11-18 DIAGNOSIS — Z79899 Other long term (current) drug therapy: Secondary | ICD-10-CM | POA: Diagnosis not present

## 2019-11-18 DIAGNOSIS — E7800 Pure hypercholesterolemia, unspecified: Secondary | ICD-10-CM | POA: Diagnosis not present

## 2019-11-18 DIAGNOSIS — E1165 Type 2 diabetes mellitus with hyperglycemia: Secondary | ICD-10-CM | POA: Diagnosis not present

## 2019-11-18 DIAGNOSIS — D539 Nutritional anemia, unspecified: Secondary | ICD-10-CM | POA: Diagnosis not present

## 2019-11-18 DIAGNOSIS — R635 Abnormal weight gain: Secondary | ICD-10-CM | POA: Diagnosis not present

## 2019-11-18 DIAGNOSIS — E039 Hypothyroidism, unspecified: Secondary | ICD-10-CM | POA: Diagnosis not present

## 2019-11-18 DIAGNOSIS — R0602 Shortness of breath: Secondary | ICD-10-CM | POA: Diagnosis not present

## 2019-11-18 DIAGNOSIS — Z1159 Encounter for screening for other viral diseases: Secondary | ICD-10-CM | POA: Diagnosis not present

## 2019-11-18 DIAGNOSIS — E559 Vitamin D deficiency, unspecified: Secondary | ICD-10-CM | POA: Diagnosis not present

## 2019-11-20 DIAGNOSIS — F0390 Unspecified dementia without behavioral disturbance: Secondary | ICD-10-CM | POA: Diagnosis not present

## 2019-11-20 DIAGNOSIS — R32 Unspecified urinary incontinence: Secondary | ICD-10-CM | POA: Diagnosis not present

## 2019-11-20 DIAGNOSIS — N3941 Urge incontinence: Secondary | ICD-10-CM | POA: Diagnosis not present

## 2019-11-26 ENCOUNTER — Other Ambulatory Visit: Payer: Self-pay | Admitting: Internal Medicine

## 2019-11-26 DIAGNOSIS — E785 Hyperlipidemia, unspecified: Secondary | ICD-10-CM

## 2019-12-05 ENCOUNTER — Other Ambulatory Visit: Payer: Self-pay | Admitting: Internal Medicine

## 2019-12-21 DIAGNOSIS — F0390 Unspecified dementia without behavioral disturbance: Secondary | ICD-10-CM | POA: Diagnosis not present

## 2019-12-21 DIAGNOSIS — N3941 Urge incontinence: Secondary | ICD-10-CM | POA: Diagnosis not present

## 2019-12-21 DIAGNOSIS — R32 Unspecified urinary incontinence: Secondary | ICD-10-CM | POA: Diagnosis not present

## 2019-12-23 DIAGNOSIS — G4733 Obstructive sleep apnea (adult) (pediatric): Secondary | ICD-10-CM | POA: Diagnosis not present

## 2019-12-23 DIAGNOSIS — Z9989 Dependence on other enabling machines and devices: Secondary | ICD-10-CM | POA: Diagnosis not present

## 2019-12-23 DIAGNOSIS — E875 Hyperkalemia: Secondary | ICD-10-CM | POA: Diagnosis not present

## 2019-12-23 DIAGNOSIS — L821 Other seborrheic keratosis: Secondary | ICD-10-CM | POA: Diagnosis not present

## 2019-12-23 DIAGNOSIS — Z03818 Encounter for observation for suspected exposure to other biological agents ruled out: Secondary | ICD-10-CM | POA: Diagnosis not present

## 2019-12-23 DIAGNOSIS — L918 Other hypertrophic disorders of the skin: Secondary | ICD-10-CM | POA: Diagnosis not present

## 2020-01-06 NOTE — Progress Notes (Incomplete)
{Choose 1 Note Type (Telehealth Visit or Telephone Visit):[phone]}   Date:  01/06/2020   ID:  Anikah Mowell, DOB January 07, 1951, MRN [id number]  {Patient Location:[phone]::"Home"} {Provider Location:[phone]::"Home"}  PCP:  Ladell Pier, MD  Cardiologist: Dr. Claiborne Billings Electrophysiologist:  None   Evaluation Performed:  {Choose Visit [id number] Visit"}  Chief Complaint:    History of Present Illness:    Cortnie Distler is a 69 y.o. female with for ongoing assessment and management of chronic dyspnea, diastolic CHF, HTN, HL with other history of OSA on CPAP, hypothyroidism, and atypical chest pain.  She was seen last by Dr. Claiborne Billings on 11/05/2017 at which time she was gaining weight, she complained of LEE, and continued to have some dyspnea. Most recent echocardiogram 2018 demonstrated normal EF with grade I diastolic dysfunction,  mild left atrial enlargement, and trivial MR and TR.  She remained hypertensive. She was increased on spironolactone to 25 mg in the am and 12.5 mg at HS, carvedilol to 18.75 in the am, and 12.5 mg at HS.   She apparently with cited for not wearing her seat belt and required a letter to be excused from wearing one by Dr. Claiborne Billings. She called our office about this on 12/02/2019.  Dr. Claiborne Billings deferred her to PCP and she had no cardiac reason to avoid wearing her seat belt.   patient {does/does not:200015} have symptoms concerning for COVID-19 infection (fever, chills, cough, or new shortness of breath).    Past Medical History:  Diagnosis Date  . Anxiety   . Carpal tunnel syndrome 06/14/2014  . Chest pain   . CHF (congestive heart failure) ([place])   . Chronic back pain   . Chronic kidney disease 03/2013   failure due to meds  . Diabetes mellitus type II, uncontrolled ([place])   . Diverticulitis   . Frequent headaches   . Hiatal hernia   . Hyperlipemia   . Hypertension   . Hypothyroidism   . 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 Neuropathy   . Obesity    . Pneumonia   . Vitamin D deficiency 06/05/2014   Past Surgical History:  Procedure Laterality Date  . CESAREAN SECTION    . KNEE SURGERY Left   . THYROID SURGERY     goiter     No outpatient medications have been marked as taking for the 01/08/20 encounter (Appointment) with Lendon Colonel, NP.     Allergies:   Amoxicillin-pot clavulanate, Hydrocodone-acetaminophen, Oxycodone, Sulfa antibiotics, Norvasc [amlodipine besylate], Sulfamethoxazole, and Tramadol   Social History   Tobacco Use  . Smoking status: Never Smoker  . Smokeless tobacco: Never Used  Substance Use Topics  . Alcohol use: No  . Drug use: No     Family Hx: The patient's family history includes Breast cancer in her mother, sister, sister, sister, sister, and sister; COPD in her sister; Cancer in her father and mother; Diabetes in her father and sister; Heart disease in her father, sister, and sister. There is no history of Colon cancer, Esophageal cancer, Rectal cancer, or Stomach cancer.  ROS:   Please see the history of present illness.    *** All other systems reviewed and are negative.   Prior CV studies:   The following studies were reviewed today: Echocardiogram 08/02/2018  Left ventricle: The cavity size was normal. There was mild focal  basal hypertrophy of the septum. Systolic function was normal.  The estimated ejection fraction was in the range of 60% to 65%.  Wall motion was  normal; there were no regional wall motion  abnormalities. Doppler parameters are consistent with abnormal  left ventricular relaxation (grade 1 diastolic dysfunction).  - Mitral valve: Calcified annulus. There was trivial regurgitation.  - Right ventricle: Systolic function was normal.  - Atrial septum: No defect or patent foramen ovale was identified.  - Tricuspid valve: There was trivial regurgitation.  - Pulmonic valve: There was no significant regurgitation.  - Pulmonary arteries: Systolic pressure  was mildly increased. PA  peak pressure: 37 mm Hg (S).   Labs/Other Tests and Data Reviewed:    EKG:  {EKG/Telemetry Strips Reviewed:[phone]}  Recent Labs: 05/09/2019: ALT 17; BUN 17; Creatinine, Ser 0.90; Hemoglobin 12.0; Platelets 226; Potassium 4.4; Sodium 145   Recent Lipid Panel Lab Results  Component Value Date/Time   CHOL 110 11/25/2016 11:15 AM   TRIG 169 (H) 11/25/2016 11:15 AM   HDL 36 (L) 11/25/2016 11:15 AM   CHOLHDL 3.1 11/25/2016 11:15 AM   LDLCALC 40 11/25/2016 11:15 AM    Wt Readings from Last 3 Encounters:  11/14/19 (!) 322 lb 15.6 oz (146.5 kg)  07/17/19 (!) 323 lb (146.5 kg)  05/09/19 (!) 313 lb (142 kg)     Objective:    Vital Signs:  There were no vitals taken for this visit.   {[facility] Virtual Exam (Optional):[phone]::"VITAL SIGNS:  reviewed"}  ASSESSMENT & PLAN:    1. ***  COVID-19 Education: The signs and symptoms of COVID-19 were discussed with the patient and how to seek care for testing (follow up with PCP or arrange E-visit).  ***The importance of social distancing was discussed today.  Time:   Today, I have spent *** minutes with the patient with telehealth technology discussing the above problems.     Medication Adjustments/Labs and Tests Ordered: Current medicines are reviewed at length with the patient today.  Concerns regarding medicines are outlined above.   Tests Ordered: No orders of the defined types were placed in this encounter.   Medication Changes: No orders of the defined types were placed in this encounter.   Disposition:  Follow up {follow up:15908}  Signed, Phill Myron. [place] Pugh, ANP, AACC  01/06/2020 3:33 PM    [facility]

## 2020-01-08 ENCOUNTER — Telehealth: Payer: Medicare HMO | Admitting: Adult Health

## 2020-01-13 ENCOUNTER — Other Ambulatory Visit: Payer: Self-pay | Admitting: Internal Medicine

## 2020-01-13 DIAGNOSIS — K219 Gastro-esophageal reflux disease without esophagitis: Secondary | ICD-10-CM

## 2020-01-20 DIAGNOSIS — E1165 Type 2 diabetes mellitus with hyperglycemia: Secondary | ICD-10-CM | POA: Diagnosis not present

## 2020-01-20 DIAGNOSIS — N183 Chronic kidney disease, stage 3 unspecified: Secondary | ICD-10-CM | POA: Diagnosis not present

## 2020-01-20 DIAGNOSIS — E7800 Pure hypercholesterolemia, unspecified: Secondary | ICD-10-CM | POA: Diagnosis not present

## 2020-01-20 DIAGNOSIS — Z780 Asymptomatic menopausal state: Secondary | ICD-10-CM | POA: Diagnosis not present

## 2020-01-20 DIAGNOSIS — R739 Hyperglycemia, unspecified: Secondary | ICD-10-CM | POA: Diagnosis not present

## 2020-01-20 DIAGNOSIS — Z0000 Encounter for general adult medical examination without abnormal findings: Secondary | ICD-10-CM | POA: Diagnosis not present

## 2020-01-20 DIAGNOSIS — E039 Hypothyroidism, unspecified: Secondary | ICD-10-CM | POA: Diagnosis not present

## 2020-01-20 DIAGNOSIS — R9431 Abnormal electrocardiogram [ECG] [EKG]: Secondary | ICD-10-CM | POA: Diagnosis not present

## 2020-01-20 DIAGNOSIS — Z03818 Encounter for observation for suspected exposure to other biological agents ruled out: Secondary | ICD-10-CM | POA: Diagnosis not present

## 2020-01-23 DIAGNOSIS — M79661 Pain in right lower leg: Secondary | ICD-10-CM | POA: Diagnosis not present

## 2020-01-23 DIAGNOSIS — F0390 Unspecified dementia without behavioral disturbance: Secondary | ICD-10-CM | POA: Diagnosis not present

## 2020-01-23 DIAGNOSIS — R32 Unspecified urinary incontinence: Secondary | ICD-10-CM | POA: Diagnosis not present

## 2020-01-23 DIAGNOSIS — N3941 Urge incontinence: Secondary | ICD-10-CM | POA: Diagnosis not present

## 2020-01-23 DIAGNOSIS — R0989 Other specified symptoms and signs involving the circulatory and respiratory systems: Secondary | ICD-10-CM | POA: Diagnosis not present

## 2020-01-23 DIAGNOSIS — R9431 Abnormal electrocardiogram [ECG] [EKG]: Secondary | ICD-10-CM | POA: Diagnosis not present

## 2020-01-23 DIAGNOSIS — M79662 Pain in left lower leg: Secondary | ICD-10-CM | POA: Diagnosis not present

## 2020-01-25 DIAGNOSIS — R9431 Abnormal electrocardiogram [ECG] [EKG]: Secondary | ICD-10-CM | POA: Diagnosis not present

## 2020-02-03 DIAGNOSIS — L821 Other seborrheic keratosis: Secondary | ICD-10-CM | POA: Diagnosis not present

## 2020-02-05 DIAGNOSIS — E1165 Type 2 diabetes mellitus with hyperglycemia: Secondary | ICD-10-CM | POA: Diagnosis not present

## 2020-02-05 DIAGNOSIS — E7800 Pure hypercholesterolemia, unspecified: Secondary | ICD-10-CM | POA: Diagnosis not present

## 2020-02-05 DIAGNOSIS — E875 Hyperkalemia: Secondary | ICD-10-CM | POA: Diagnosis not present

## 2020-02-05 DIAGNOSIS — N189 Chronic kidney disease, unspecified: Secondary | ICD-10-CM | POA: Diagnosis not present

## 2020-02-06 DIAGNOSIS — M79661 Pain in right lower leg: Secondary | ICD-10-CM | POA: Diagnosis not present

## 2020-02-06 DIAGNOSIS — I779 Disorder of arteries and arterioles, unspecified: Secondary | ICD-10-CM | POA: Diagnosis not present

## 2020-02-06 DIAGNOSIS — R9431 Abnormal electrocardiogram [ECG] [EKG]: Secondary | ICD-10-CM | POA: Diagnosis not present

## 2020-02-06 DIAGNOSIS — M79662 Pain in left lower leg: Secondary | ICD-10-CM | POA: Diagnosis not present

## 2020-02-06 DIAGNOSIS — R0989 Other specified symptoms and signs involving the circulatory and respiratory systems: Secondary | ICD-10-CM | POA: Diagnosis not present

## 2020-02-06 DIAGNOSIS — I739 Peripheral vascular disease, unspecified: Secondary | ICD-10-CM | POA: Diagnosis not present

## 2020-02-09 DIAGNOSIS — E039 Hypothyroidism, unspecified: Secondary | ICD-10-CM | POA: Diagnosis not present

## 2020-02-09 DIAGNOSIS — Z9989 Dependence on other enabling machines and devices: Secondary | ICD-10-CM | POA: Diagnosis not present

## 2020-02-09 DIAGNOSIS — G4733 Obstructive sleep apnea (adult) (pediatric): Secondary | ICD-10-CM | POA: Diagnosis not present

## 2020-02-09 DIAGNOSIS — I10 Essential (primary) hypertension: Secondary | ICD-10-CM | POA: Diagnosis not present

## 2020-02-13 DIAGNOSIS — R9431 Abnormal electrocardiogram [ECG] [EKG]: Secondary | ICD-10-CM | POA: Diagnosis not present

## 2020-02-13 DIAGNOSIS — R0602 Shortness of breath: Secondary | ICD-10-CM | POA: Diagnosis not present

## 2020-02-21 DIAGNOSIS — F0390 Unspecified dementia without behavioral disturbance: Secondary | ICD-10-CM | POA: Diagnosis not present

## 2020-02-21 DIAGNOSIS — R32 Unspecified urinary incontinence: Secondary | ICD-10-CM | POA: Diagnosis not present

## 2020-02-21 DIAGNOSIS — N3941 Urge incontinence: Secondary | ICD-10-CM | POA: Diagnosis not present

## 2020-03-02 DIAGNOSIS — L918 Other hypertrophic disorders of the skin: Secondary | ICD-10-CM | POA: Diagnosis not present

## 2020-03-16 DIAGNOSIS — A499 Bacterial infection, unspecified: Secondary | ICD-10-CM | POA: Diagnosis not present

## 2020-03-16 DIAGNOSIS — Z111 Encounter for screening for respiratory tuberculosis: Secondary | ICD-10-CM | POA: Diagnosis not present

## 2020-03-16 DIAGNOSIS — E1122 Type 2 diabetes mellitus with diabetic chronic kidney disease: Secondary | ICD-10-CM | POA: Diagnosis not present

## 2020-03-16 DIAGNOSIS — E1165 Type 2 diabetes mellitus with hyperglycemia: Secondary | ICD-10-CM | POA: Diagnosis not present

## 2020-03-16 DIAGNOSIS — E7800 Pure hypercholesterolemia, unspecified: Secondary | ICD-10-CM | POA: Diagnosis not present

## 2020-03-16 DIAGNOSIS — N183 Chronic kidney disease, stage 3 unspecified: Secondary | ICD-10-CM | POA: Diagnosis not present

## 2020-03-16 DIAGNOSIS — E039 Hypothyroidism, unspecified: Secondary | ICD-10-CM | POA: Diagnosis not present

## 2020-03-16 DIAGNOSIS — N390 Urinary tract infection, site not specified: Secondary | ICD-10-CM | POA: Diagnosis not present

## 2020-03-16 DIAGNOSIS — I129 Hypertensive chronic kidney disease with stage 1 through stage 4 chronic kidney disease, or unspecified chronic kidney disease: Secondary | ICD-10-CM | POA: Diagnosis not present

## 2020-03-16 DIAGNOSIS — I10 Essential (primary) hypertension: Secondary | ICD-10-CM | POA: Diagnosis not present

## 2020-03-18 DIAGNOSIS — E1165 Type 2 diabetes mellitus with hyperglycemia: Secondary | ICD-10-CM | POA: Diagnosis not present

## 2020-03-18 DIAGNOSIS — I10 Essential (primary) hypertension: Secondary | ICD-10-CM | POA: Diagnosis not present

## 2020-03-18 DIAGNOSIS — N183 Chronic kidney disease, stage 3 unspecified: Secondary | ICD-10-CM | POA: Diagnosis not present

## 2020-03-20 DIAGNOSIS — N390 Urinary tract infection, site not specified: Secondary | ICD-10-CM | POA: Diagnosis not present

## 2020-03-20 DIAGNOSIS — A499 Bacterial infection, unspecified: Secondary | ICD-10-CM | POA: Diagnosis not present

## 2020-03-20 DIAGNOSIS — E119 Type 2 diabetes mellitus without complications: Secondary | ICD-10-CM | POA: Diagnosis not present

## 2020-04-09 ENCOUNTER — Other Ambulatory Visit: Payer: Self-pay

## 2020-04-09 ENCOUNTER — Encounter (HOSPITAL_BASED_OUTPATIENT_CLINIC_OR_DEPARTMENT_OTHER): Payer: Self-pay

## 2020-04-09 ENCOUNTER — Emergency Department (HOSPITAL_BASED_OUTPATIENT_CLINIC_OR_DEPARTMENT_OTHER): Payer: Medicare HMO

## 2020-04-09 ENCOUNTER — Emergency Department (HOSPITAL_BASED_OUTPATIENT_CLINIC_OR_DEPARTMENT_OTHER)
Admission: EM | Admit: 2020-04-09 | Discharge: 2020-04-09 | Disposition: A | Discharge status: Home / Self Care | Payer: Medicare HMO | Attending: Emergency Medicine | Admitting: Emergency Medicine

## 2020-04-09 DIAGNOSIS — Y939 Activity, unspecified: Secondary | ICD-10-CM | POA: Insufficient documentation

## 2020-04-09 DIAGNOSIS — Z7982 Long term (current) use of aspirin: Secondary | ICD-10-CM | POA: Diagnosis not present

## 2020-04-09 DIAGNOSIS — N189 Chronic kidney disease, unspecified: Secondary | ICD-10-CM | POA: Diagnosis not present

## 2020-04-09 DIAGNOSIS — Y929 Unspecified place or not applicable: Secondary | ICD-10-CM | POA: Diagnosis not present

## 2020-04-09 DIAGNOSIS — W228XXA Striking against or struck by other objects, initial encounter: Secondary | ICD-10-CM | POA: Insufficient documentation

## 2020-04-09 DIAGNOSIS — S0081XA Abrasion of other part of head, initial encounter: Secondary | ICD-10-CM | POA: Insufficient documentation

## 2020-04-09 DIAGNOSIS — E039 Hypothyroidism, unspecified: Secondary | ICD-10-CM | POA: Insufficient documentation

## 2020-04-09 DIAGNOSIS — I130 Hypertensive heart and chronic kidney disease with heart failure and stage 1 through stage 4 chronic kidney disease, or unspecified chronic kidney disease: Secondary | ICD-10-CM | POA: Insufficient documentation

## 2020-04-09 DIAGNOSIS — Y999 Unspecified external cause status: Secondary | ICD-10-CM | POA: Diagnosis not present

## 2020-04-09 DIAGNOSIS — S0083XA Contusion of other part of head, initial encounter: Secondary | ICD-10-CM | POA: Insufficient documentation

## 2020-04-09 DIAGNOSIS — M25561 Pain in right knee: Secondary | ICD-10-CM | POA: Diagnosis not present

## 2020-04-09 DIAGNOSIS — E119 Type 2 diabetes mellitus without complications: Secondary | ICD-10-CM | POA: Diagnosis not present

## 2020-04-09 DIAGNOSIS — W19XXXA Unspecified fall, initial encounter: Secondary | ICD-10-CM

## 2020-04-09 DIAGNOSIS — S0990XA Unspecified injury of head, initial encounter: Secondary | ICD-10-CM | POA: Diagnosis present

## 2020-04-09 DIAGNOSIS — Z79899 Other long term (current) drug therapy: Secondary | ICD-10-CM | POA: Insufficient documentation

## 2020-04-09 DIAGNOSIS — Z794 Long term (current) use of insulin: Secondary | ICD-10-CM | POA: Diagnosis not present

## 2020-04-09 DIAGNOSIS — I5030 Unspecified diastolic (congestive) heart failure: Secondary | ICD-10-CM | POA: Diagnosis not present

## 2020-04-09 MED ORDER — FENTANYL CITRATE (PF) 100 MCG/2ML IJ SOLN
25.0000 ug | Freq: Once | INTRAMUSCULAR | Status: AC
  Administered 2020-04-09: 25 ug via INTRAMUSCULAR
  Filled 2020-04-09: qty 2

## 2020-04-09 MED ORDER — ACETAMINOPHEN 325 MG PO TABS
650.0000 mg | ORAL_TABLET | Freq: Once | ORAL | Status: AC
  Administered 2020-04-09: 650 mg via ORAL
  Filled 2020-04-09: qty 2

## 2020-04-09 NOTE — ED Triage Notes (Signed)
Pt states she fell ~1 hours PTA-pain to right knee, right hand, right forehead and back of head-no LOC-to triage in w/c

## 2020-04-09 NOTE — Discharge Instructions (Signed)

## 2020-04-09 NOTE — ED Provider Notes (Signed)
[facility] EMERGENCY DEPARTMENT Provider Note   CSN: [id number] Arrival date & time: 04/09/20  2059     History Chief Complaint  Patient presents with  . Fall    Anita James is a 69 y.o. female.  HPI   69 year old female with a history of anxiety, CHF, CKD, diabetes, diverticulitis, hyperlipidemia, hypertension, who presents to the emergency department today for evaluation of a fall.  States that she was outside next to her car when her dog caused her to fall forward.  States she hit her head on the tire of her car and fell to the ground.  She is also complaining of pain to the right side of her neck, right hand and right knee.  She has been ambulatory since the fall.  She does not think she lost consciousness.  Denies any vomiting or other symptoms at this time.  Past Medical History:  Diagnosis Date  . Anxiety   . Carpal tunnel syndrome 06/14/2014  . Chest pain   . CHF (congestive heart failure) ([place])   . Chronic back pain   . Chronic kidney disease 03/2013   failure due to meds  . Diabetes mellitus type II, uncontrolled ([place])   . Diverticulitis   . Frequent headaches   . Hiatal hernia   . Hyperlipemia   . Hypertension   . Hypothyroidism   . 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 Neuropathy   . Obesity   . Pneumonia   . Vitamin D deficiency 06/05/2014    Patient Active Problem List   Diagnosis Date Noted  . OSA on CPAP 05/19/2017  . Morbid obesity due to excess calories ([place]) 03/17/2017  . Neuropathic pain 11/04/2016  . Carpal tunnel syndrome 06/14/2014  . 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 Cervical radiculopathy at C8 06/05/2014  . Hyperlipemia 09/12/2013  . Diastolic CHF ([place]) 02/63/7858  . Major depression 09/12/2013  . Allergic rhinitis 06/22/2013  . Chronic pain syndrome 05/31/2013  . Gastroesophageal reflux disease without esophagitis 08/30/2012  . Essential hypertension   . Hypothyroidism   . Type 2 diabetes, uncontrolled, with neuropathy  [facility])     Past Surgical History:  Procedure Laterality Date  . CESAREAN SECTION    . KNEE SURGERY Left   . THYROID SURGERY     goiter     OB History    Gravida  4   Para  3   Term  2   Preterm  1   AB  1   Living  6     SAB      TAB      Ectopic  1   Multiple  2   Live Births  5           Family History  Problem Relation Age of Onset  . Cancer Mother        cervical- mets  . Breast cancer Mother   . Cancer Father   . Heart disease Father   . Diabetes Father   . Diabetes Sister   . Breast cancer Sister   . Heart disease Sister   . Breast cancer Sister   . Heart disease Sister   . Breast cancer Sister   . Breast cancer Sister   . COPD Sister   . Breast cancer Sister   . Colon cancer Neg Hx   . Esophageal cancer Neg Hx   . Rectal cancer Neg Hx   . Stomach cancer Neg Hx  Social History   Tobacco Use  . Smoking status: Never Smoker  . Smokeless tobacco: Never Used  Vaping Use  . Vaping Use: Never used  Substance Use Topics  . Alcohol use: No  . Drug use: No    Home Medications Prior to Admission medications   Medication Sig Start Date End Date Taking? Authorizing Provider  torsemide (DEMADEX) 20 MG tablet Comments:   Filled Date: Apr 29 2019  1:31PM  Patient Notes: TAKE 1 TABLET BY MOUTH EVERY MORNING AS NEEDED. FLUID PILL  Duration: 90 05/09/18  Yes [provider]  Accu-Chek FastClix Lancets MISC USE AS DIRECTED TO CHECK BLOOD SUGAR THREE TIMES DAILY 11/07/19   Ladell Pier, MD  ACCU-CHEK GUIDE test strip USE AS DIRECTED TO CHECK BLOOD SUGAR THREE TIMES DAILY 10/04/19   Ladell Pier, MD  acetaminophen (TYLENOL) 500 MG tablet Take 1 tablet (500 mg total) by mouth every 6 (six) hours as needed. 09/16/18   Darlin Drop P, PA-C  aspirin EC 81 MG tablet Take 81 mg by mouth daily. 11/20/16   [provider]  atorvastatin (LIPITOR) 40 MG tablet TAKE 1 TABLET BY MOUTH DAILY AT 6 PM 11/27/19   Ladell Pier, MD   Blood Glucose Monitoring Suppl (ACCU-CHEK AVIVA PLUS) w/Device KIT USE AS DIRECTED TO TEST TWICE DAILY 04/27/18   Ladell Pier, MD  carvedilol (COREG) 12.5 MG tablet Take 1.5 tablets (18.75 mg total) by mouth 2 (two) times daily with a meal. 10/13/19   Ladell Pier, MD  Cetirizine HCl 10 MG CAPS Take 1 capsule (10 mg total) by mouth daily. 10/21/18   Ladell Pier, MD  ciprofloxacin (CIPRO) 500 MG tablet Take 1 tablet (500 mg total) by mouth 2 (two) times daily. 11/14/19   Fredia Sorrow, MD  DEXILANT 60 MG capsule TAKE ONE CAPSULE BY MOUTH DAILY 01/15/20   Ladell Pier, MD  diclofenac sodium (VOLTAREN) 1 % GEL Apply 4 g topically 4 (four) times daily. 02/27/19   Deno Etienne, DO  Ergocalciferol (VITAMIN D2) 10 MCG (400 UNIT) TABS Take 400 Units by mouth daily. 10/21/18   Ladell Pier, MD  ezetimibe (ZETIA) 10 MG tablet Take 10 mg by mouth daily. 02/05/20   [provider]  FARXIGA 10 MG TABS tablet Take 10 mg by mouth daily. 03/16/20   [provider]  fluticasone (FLONASE) 50 MCG/ACT nasal spray PLACE 2 SPRAYS IN EACH NOSTRIL DAILY AS NEEDED FOR ALLERGIES 07/21/19   Ladell Pier, MD  gabapentin (NEURONTIN) 300 MG capsule TAKE 2 CAPSULES BY MOUTH TWICE DAILY 10/13/19   Ladell Pier, MD  insulin aspart (NOVOLOG FLEXPEN) 100 UNIT/ML FlexPen Inject 60 Units into the skin 3 (three) times daily with meals. 12/05/19   Ladell Pier, MD  Insulin Glargine (LANTUS SOLOSTAR) 100 UNIT/ML Solostar Pen Inject 84 Units into the skin daily. 10/13/19   Ladell Pier, MD  Insulin Pen Needle (TRUEPLUS PEN NEEDLES) 32G X 4 MM MISC Use to inject insulin. 10/13/18   Ladell Pier, MD  levothyroxine (SYNTHROID) 100 MCG tablet Take 1 tablet (100 mcg total) by mouth daily. 10/13/19   Ladell Pier, MD  liraglutide (VICTOZA) 18 MG/3ML SOPN Inject 1.8 mg into the skin daily. 10/13/19   Ladell Pier, MD  lisinopril (ZESTRIL) 40 MG tablet TAKE 1 TABLET BY  MOUTH DAILY 11/10/19   Ladell Pier, MD  mometasone (NASONEX) 50 MCG/ACT nasal spray Place 2 sprays into the  nose daily. Patient taking differently: Place 2 sprays into the nose daily as needed (allergies).  08/24/17   Ladell Pier, MD  Multiple Vitamins-Minerals (EYE VITAMINS) CAPS Take 1 capsule by mouth daily. 08/11/12   Thurnell Lose, MD  NOVOFINE 32G X 6 MM MISC SMARTSIG:1 SUB-Q 4-5 Times Daily 01/22/20   [provider]  NYSTATIN powder APPLY TOPICALLY 4 TIMES DAILY. Patient taking differently: Apply 1 Bottle topically 4 (four) times daily as needed (rash).  01/18/17   Langeland, Leda Quail, MD  ondansetron (ZOFRAN) 4 MG tablet Take 1 tablet (4 mg total) by mouth every 6 (six) hours. 09/16/18   Darlin Drop P, PA-C  phenazopyridine (PYRIDIUM) 200 MG tablet Take 1 tablet (200 mg total) by mouth 3 (three) times daily. 07/17/19   Gareth Morgan, MD  spironolactone (ALDACTONE) 25 MG tablet TAKE 1 TABLET BY MOUTH EVERY MORNING AND 1/2 TABLET BY MOUTH EVERY EVENING 10/13/19   Ladell Pier, MD  valACYclovir (VALTREX) 1000 MG tablet Take 1 tablet (1,000 mg total) by mouth 2 (two) times daily. 05/09/19   Charlott Rakes, MD    Allergies    Amoxicillin-pot clavulanate, Hydrocodone-acetaminophen, Oxycodone, Sulfa antibiotics, Norvasc [amlodipine besylate], Sulfamethoxazole, and Tramadol  Review of Systems   Review of Systems  Respiratory: Negative for shortness of breath.   Cardiovascular: Negative for chest pain.  Gastrointestinal: Negative for nausea and vomiting.  Genitourinary: Negative for flank pain.  Musculoskeletal: Positive for neck pain.       Right hand pain, right knee pain  Skin: Positive for wound (abrasion to forehead).  Neurological: Positive for headaches. Negative for dizziness, weakness, light-headedness and numbness.       Head injury, no loc  All other systems reviewed and are negative.   Physical Exam Updated Vital Signs BP (!) 159/75 (BP  Location: Right Arm)   Pulse 83   Temp 98.5 F (36.9 C) (Oral)   Resp 16   Ht '5\' 6"'  (1.676 m)   Wt (!) 138.8 kg   SpO2 96%   BMI 49.39 kg/m   Physical Exam Vitals and nursing note reviewed.  Constitutional:      General: She is not in acute distress.    Appearance: She is well-developed.  HENT:     Head: Normocephalic.     Comments: Abrasion and hematoma to right forehead Eyes:     Extraocular Movements: Extraocular movements intact.     Conjunctiva/sclera: Conjunctivae normal.     Pupils: Pupils are equal, round, and reactive to light.  Cardiovascular:     Rate and Rhythm: Normal rate and regular rhythm.     Heart sounds: No murmur heard.   Pulmonary:     Effort: Pulmonary effort is normal. No respiratory distress.     Breath sounds: Normal breath sounds.  Abdominal:     Palpations: Abdomen is soft.     Tenderness: There is no abdominal tenderness.  Musculoskeletal:     Cervical back: Neck supple.     Comments: Mild c spine ttp with right cervical paraspinous muscle tpp. TTP of the right hand with swelling over the right 2/3rd MCPs. TTP to the right anterior knee with swelling and erythema. No joint laxity.  Skin:    General: Skin is warm and dry.  Neurological:     Mental Status: She is alert.     Comments: Mental Status:  Alert, thought content appropriate, able to give a coherent history. Speech fluent without evidence of aphasia. Able to follow 2  step commands without difficulty.  Cranial Nerves:  II:  Peripheral visual fields grossly normal, pupils equal, round, reactive to light III,IV, VI: ptosis not present, extra-ocular motions intact bilaterally  V,VII: smile symmetric, facial light touch sensation equal VIII: hearing grossly normal to voice  X: uvula elevates symmetrically  XI: bilateral shoulder shrug symmetric and strong XII: midline tongue extension without fassiculations Motor:  Normal tone. moving all extremities.  Sensory: light touch normal in  all extremities.     ED Results / Procedures / Treatments   Labs (all labs ordered are listed, but only abnormal results are displayed) Labs Reviewed - No data to display  EKG None  Radiology CT Head Wo Contrast  Result Date: 04/09/2020 CLINICAL DATA:  Fall EXAM: CT CERVICAL SPINE WITHOUT CONTRAST TECHNIQUE: Multidetector CT imaging of the cervical spine was performed without intravenous contrast. Multiplanar CT image reconstructions were also generated. COMPARISON:  None. FINDINGS: Brain: No evidence of acute territorial infarction, hemorrhage, hydrocephalus,extra-axial collection or mass lesion/mass effect. Normal gray-white differentiation. Ventricles are normal in size and contour. Vascular: No hyperdense vessel or unexpected calcification. Skull: The skull is intact. No fracture or focal lesion identified. Sinuses/Orbits: The visualized paranasal sinuses and mastoid air cells are clear. The orbits and globes intact. Other: Small area of soft tissue swelling and hematoma overlying the right frontal skull Cervical spine: Alignment: There is a minimal anterolisthesis of C3 on C4 and C4 on C5. Skull base and vertebrae: Visualized skull base is intact. No atlanto-occipital dissociation. The vertebral body heights are well maintained. No fracture or pathologic osseous lesion seen. Soft tissues and spinal canal: The visualized paraspinal soft tissues are unremarkable. No prevertebral soft tissue swelling is seen. The spinal canal is grossly unremarkable, no large epidural collection or significant canal narrowing. Disc levels: Multilevel cervical spine spondylosis is seen with disc height loss, disc osteophyte complex and uncovertebral osteophytes most notable at C3-C4 with moderate to severe neural foraminal narrowing and mild central canal stenosis. Upper chest: The lung apices are clear. Thoracic inlet is within normal limits. Other: None IMPRESSION: No acute intracranial abnormality. No acute  fracture or malalignment of the spine. Small area of soft tissue swelling overlying the right frontal skull. Electronically Signed   By: Prudencio Pair M.D.   On: 04/09/2020 21:54   CT Cervical Spine Wo Contrast  Result Date: 04/09/2020 CLINICAL DATA:  Fall EXAM: CT CERVICAL SPINE WITHOUT CONTRAST TECHNIQUE: Multidetector CT imaging of the cervical spine was performed without intravenous contrast. Multiplanar CT image reconstructions were also generated. COMPARISON:  None. FINDINGS: Brain: No evidence of acute territorial infarction, hemorrhage, hydrocephalus,extra-axial collection or mass lesion/mass effect. Normal gray-white differentiation. Ventricles are normal in size and contour. Vascular: No hyperdense vessel or unexpected calcification. Skull: The skull is intact. No fracture or focal lesion identified. Sinuses/Orbits: The visualized paranasal sinuses and mastoid air cells are clear. The orbits and globes intact. Other: Small area of soft tissue swelling and hematoma overlying the right frontal skull Cervical spine: Alignment: There is a minimal anterolisthesis of C3 on C4 and C4 on C5. Skull base and vertebrae: Visualized skull base is intact. No atlanto-occipital dissociation. The vertebral body heights are well maintained. No fracture or pathologic osseous lesion seen. Soft tissues and spinal canal: The visualized paraspinal soft tissues are unremarkable. No prevertebral soft tissue swelling is seen. The spinal canal is grossly unremarkable, no large epidural collection or significant canal narrowing. Disc levels: Multilevel cervical spine spondylosis is seen with disc height loss, disc osteophyte complex  and uncovertebral osteophytes most notable at C3-C4 with moderate to severe neural foraminal narrowing and mild central canal stenosis. Upper chest: The lung apices are clear. Thoracic inlet is within normal limits. Other: None IMPRESSION: No acute intracranial abnormality. No acute fracture or  malalignment of the spine. Small area of soft tissue swelling overlying the right frontal skull. Electronically Signed   By: Prudencio Pair M.D.   On: 04/09/2020 21:54   DG Knee Complete 4 Views Right  Result Date: 04/09/2020 CLINICAL DATA:  69 year old female with fall and right knee pain. EXAM: RIGHT KNEE - COMPLETE 4+ VIEW COMPARISON:  None. FINDINGS: There is no acute fracture or dislocation. The bones are mildly osteopenic. Mild arthritic changes of the knee with meniscal chondrocalcinosis. No significant joint effusion. There is mild diffuse subcutaneous edema and probable small contusion anterior to the patellar tendon. No radiopaque foreign object or soft tissue gas. IMPRESSION: No acute fracture or dislocation. Electronically Signed   By: Anner Crete M.D.   On: 04/09/2020 21:57   DG Hand Complete Right  Result Date: 04/09/2020 CLINICAL DATA:  Fall EXAM: RIGHT HAND - COMPLETE 3+ VIEW COMPARISON:  None. FINDINGS: There is no evidence of fracture or dislocation. There is no evidence of arthropathy or other focal bone abnormality. Mild dorsal soft tissue swelling. Scattered vascular calcifications are noted. IMPRESSION: Negative. Electronically Signed   By: Prudencio Pair M.D.   On: 04/09/2020 22:06    Procedures Procedures (including critical care time)  Medications Ordered in ED Medications  fentaNYL (SUBLIMAZE) injection 25 mcg (has no administration in time range)  acetaminophen (TYLENOL) tablet 650 mg (650 mg Oral Given 04/09/20 2135)    ED Course  I have reviewed the triage vital signs and the nursing notes.  Pertinent labs & imaging results that were available during my care of the patient were reviewed by me and considered in my medical decision making (see chart for details).    MDM Rules/Calculators/A&P                          69 year old female presenting for evaluation after mechanical fall.  Sustained head trauma but did not lose consciousness.  Complaining of a  headache, neck pain, right hand and right knee pain.  Reviewed/interpreted all imaging CT head/cervical spine - w/o acute intracranial bleeding or traumatic injury  X-ray right hand -  W/o acute traumatic injury X-ray right knee -  W/o acute traumatic injury  Pt placed in knee sleeve. She was given pain meds and was able to ambulate in the ED with steady gait. Advised otc pain meds, warm/cold compresseses and close PCP f/u. Advised on return precautions. She voices understanding of the plan   Final Clinical Impression(s) / ED Diagnoses Final diagnoses:  Fall, initial encounter  Minor head injury, initial encounter  Acute pain of right knee    Rx / DC Orders ED Discharge Orders    None       Bishop Dublin 04/09/20 2249    Quintella Reichert, MD 04/09/20 2324

## 2020-04-15 ENCOUNTER — Other Ambulatory Visit: Payer: Self-pay

## 2020-04-15 ENCOUNTER — Encounter (HOSPITAL_BASED_OUTPATIENT_CLINIC_OR_DEPARTMENT_OTHER): Payer: Self-pay | Admitting: Emergency Medicine

## 2020-04-15 ENCOUNTER — Encounter: Payer: Self-pay | Admitting: Family Medicine

## 2020-04-15 ENCOUNTER — Emergency Department (HOSPITAL_BASED_OUTPATIENT_CLINIC_OR_DEPARTMENT_OTHER)
Admission: EM | Admit: 2020-04-15 | Discharge: 2020-04-15 | Disposition: A | Discharge status: Home / Self Care | Payer: Medicare HMO | Attending: Emergency Medicine | Admitting: Emergency Medicine

## 2020-04-15 ENCOUNTER — Ambulatory Visit (INDEPENDENT_AMBULATORY_CARE_PROVIDER_SITE_OTHER): Payer: Medicare HMO | Admitting: Family Medicine

## 2020-04-15 ENCOUNTER — Ambulatory Visit: Payer: Self-pay

## 2020-04-15 VITALS — BP 127/70 | HR 65 | Ht 66.0 in | Wt 306.0 lb

## 2020-04-15 DIAGNOSIS — Y9289 Other specified places as the place of occurrence of the external cause: Secondary | ICD-10-CM | POA: Diagnosis not present

## 2020-04-15 DIAGNOSIS — S8991XA Unspecified injury of right lower leg, initial encounter: Secondary | ICD-10-CM | POA: Diagnosis present

## 2020-04-15 DIAGNOSIS — S134XXA Sprain of ligaments of cervical spine, initial encounter: Secondary | ICD-10-CM | POA: Insufficient documentation

## 2020-04-15 DIAGNOSIS — Y9389 Activity, other specified: Secondary | ICD-10-CM | POA: Insufficient documentation

## 2020-04-15 DIAGNOSIS — M179 Osteoarthritis of knee, unspecified: Secondary | ICD-10-CM | POA: Insufficient documentation

## 2020-04-15 DIAGNOSIS — Z7982 Long term (current) use of aspirin: Secondary | ICD-10-CM | POA: Diagnosis not present

## 2020-04-15 DIAGNOSIS — Y999 Unspecified external cause status: Secondary | ICD-10-CM | POA: Insufficient documentation

## 2020-04-15 DIAGNOSIS — Z794 Long term (current) use of insulin: Secondary | ICD-10-CM | POA: Diagnosis not present

## 2020-04-15 DIAGNOSIS — M25561 Pain in right knee: Secondary | ICD-10-CM

## 2020-04-15 DIAGNOSIS — S8001XA Contusion of right knee, initial encounter: Secondary | ICD-10-CM | POA: Insufficient documentation

## 2020-04-15 DIAGNOSIS — S139XXD Sprain of joints and ligaments of unspecified parts of neck, subsequent encounter: Secondary | ICD-10-CM

## 2020-04-15 DIAGNOSIS — I130 Hypertensive heart and chronic kidney disease with heart failure and stage 1 through stage 4 chronic kidney disease, or unspecified chronic kidney disease: Secondary | ICD-10-CM | POA: Insufficient documentation

## 2020-04-15 DIAGNOSIS — N189 Chronic kidney disease, unspecified: Secondary | ICD-10-CM | POA: Insufficient documentation

## 2020-04-15 DIAGNOSIS — E1140 Type 2 diabetes mellitus with diabetic neuropathy, unspecified: Secondary | ICD-10-CM | POA: Diagnosis not present

## 2020-04-15 DIAGNOSIS — Z79899 Other long term (current) drug therapy: Secondary | ICD-10-CM | POA: Diagnosis not present

## 2020-04-15 DIAGNOSIS — S8001XD Contusion of right knee, subsequent encounter: Secondary | ICD-10-CM

## 2020-04-15 DIAGNOSIS — I5030 Unspecified diastolic (congestive) heart failure: Secondary | ICD-10-CM | POA: Diagnosis not present

## 2020-04-15 DIAGNOSIS — W01198A Fall on same level from slipping, tripping and stumbling with subsequent striking against other object, initial encounter: Secondary | ICD-10-CM | POA: Diagnosis not present

## 2020-04-15 DIAGNOSIS — E039 Hypothyroidism, unspecified: Secondary | ICD-10-CM | POA: Diagnosis not present

## 2020-04-15 DIAGNOSIS — M79641 Pain in right hand: Secondary | ICD-10-CM | POA: Insufficient documentation

## 2020-04-15 DIAGNOSIS — S60221D Contusion of right hand, subsequent encounter: Secondary | ICD-10-CM

## 2020-04-15 DIAGNOSIS — S60221A Contusion of right hand, initial encounter: Secondary | ICD-10-CM | POA: Diagnosis not present

## 2020-04-15 MED ORDER — PREDNISONE 5 MG PO TABS: ORAL_TABLET | ORAL | 0 refills | Status: DC

## 2020-04-15 NOTE — Assessment & Plan Note (Signed)
Swelling over the dorsum of the hand.  Ecchymosis occurring in the palmar aspect.  No fractures appreciated on ultrasound or x-ray.  Increased hyperemia of the joint which could suggest capsulitis. -Counseled on home exercise therapy and supportive care. -Prednisone.  Counseled on monitoring her blood sugar and informing me if it becomes significantly elevated. -May need to repeat imaging.

## 2020-04-15 NOTE — ED Provider Notes (Signed)
[facility] EMERGENCY DEPARTMENT Provider Note   CSN: [id number] Arrival date & time: 04/15/20  7858     History Chief Complaint  [name]    Anita James is a 69 y.o. female.  69 year old female with past medical history below who presents with multiple complaints after a fall.  6 days ago, patient had a mechanical fall during which she hit the back of her head, her right knee, and her right hand.  She was evaluated in the ED and had CTs and x-rays which were negative for acute injury.  She reports that she had severe bruising of her face that has somewhat improved but she continues to have bruising and swelling of her right hand and right knee.  She has been trying to keep her extremities elevated and has applied ice as well as a brace on her right knee but she reports her symptoms persist.  She continues to have a sore spot on the back of her head and a sore spot on her right neck.  She denies any vomiting or vision changes.  She does have difficulty with ambulation secondary to her knee pain.  She states that she has had problems with trying to work and is worried that her job is going to fire her.  The history is provided by the patient.       Past Medical History:  Diagnosis Date  . Anxiety   . Carpal tunnel syndrome 06/14/2014  . Chest pain   . CHF (congestive heart failure) ([place])   . Chronic back pain   . Chronic kidney disease 03/2013   failure due to meds  . Diabetes mellitus type II, uncontrolled ([place])   . Diverticulitis   . Frequent headaches   . Hiatal hernia   . Hyperlipemia   . Hypertension   . Hypothyroidism   . 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 Neuropathy   . Obesity   . Pneumonia   . Vitamin D deficiency 06/05/2014    Patient Active Problem List   Diagnosis Date Noted  . Acute pain of right knee 04/15/2020  . Right hand pain 04/15/2020  . OSA on CPAP 05/19/2017  . Morbid obesity due to excess calories ([place])  03/17/2017  . Neuropathic pain 11/04/2016  . Carpal tunnel syndrome 06/14/2014  . 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 Cervical radiculopathy at C8 06/05/2014  . Hyperlipemia 09/12/2013  . Diastolic CHF ([place]) 85/10/7739  . Major depression 09/12/2013  . Allergic rhinitis 06/22/2013  . Chronic pain syndrome 05/31/2013  . Gastroesophageal reflux disease without esophagitis 08/30/2012  . Essential hypertension   . Hypothyroidism   . Type 2 diabetes, uncontrolled, with neuropathy [facility])     Past Surgical History:  Procedure Laterality Date  . CESAREAN SECTION    . KNEE SURGERY Left   . THYROID SURGERY     goiter     OB History    Gravida  4   Para  3   Term  2   Preterm  1   AB  1   Living  6     SAB      TAB      Ectopic  1   Multiple  2   Live Births  5           Family History  Problem Relation Age of Onset  . Cancer Mother        cervical- mets  . Breast cancer  Mother   . Cancer Father   . Heart disease Father   . Diabetes Father   . Diabetes Sister   . Breast cancer Sister   . Heart disease Sister   . Breast cancer Sister   . Heart disease Sister   . Breast cancer Sister   . Breast cancer Sister   . COPD Sister   . Breast cancer Sister   . Colon cancer Neg Hx   . Esophageal cancer Neg Hx   . Rectal cancer Neg Hx   . Stomach cancer Neg Hx     Social History   Tobacco Use  . Smoking status: Never Smoker  . Smokeless tobacco: Never Used  Vaping Use  . Vaping Use: Never used  Substance Use Topics  . Alcohol use: No  . Drug use: No    Home Medications Prior to Admission medications   Medication Sig Start Date End Date Taking? Authorizing Provider  Accu-Chek FastClix Lancets MISC USE AS DIRECTED TO CHECK BLOOD SUGAR THREE TIMES DAILY 11/07/19   Ladell Pier, MD  ACCU-CHEK GUIDE test strip USE AS DIRECTED TO CHECK BLOOD SUGAR THREE TIMES DAILY 10/04/19   Ladell Pier, MD  acetaminophen (TYLENOL) 500 MG tablet Take 1 tablet  (500 mg total) by mouth every 6 (six) hours as needed. 09/16/18   Darlin Drop P, PA-C  aspirin EC 81 MG tablet Take 81 mg by mouth daily. 11/20/16   [provider]  atorvastatin (LIPITOR) 40 MG tablet TAKE 1 TABLET BY MOUTH DAILY AT 6 PM 11/27/19   Ladell Pier, MD  Blood Glucose Monitoring Suppl (ACCU-CHEK AVIVA PLUS) w/Device KIT USE AS DIRECTED TO TEST TWICE DAILY 04/27/18   Ladell Pier, MD  carvedilol (COREG) 12.5 MG tablet Take 1.5 tablets (18.75 mg total) by mouth 2 (two) times daily with a meal. 10/13/19   Ladell Pier, MD  Cetirizine HCl 10 MG CAPS Take 1 capsule (10 mg total) by mouth daily. 10/21/18   Ladell Pier, MD  ciprofloxacin (CIPRO) 500 MG tablet Take 1 tablet (500 mg total) by mouth 2 (two) times daily. 11/14/19   Fredia Sorrow, MD  DEXILANT 60 MG capsule TAKE ONE CAPSULE BY MOUTH DAILY 01/15/20   Ladell Pier, MD  diclofenac sodium (VOLTAREN) 1 % GEL Apply 4 g topically 4 (four) times daily. 02/27/19   Deno Etienne, DO  Ergocalciferol (VITAMIN D2) 10 MCG (400 UNIT) TABS Take 400 Units by mouth daily. 10/21/18   Ladell Pier, MD  ezetimibe (ZETIA) 10 MG tablet Take 10 mg by mouth daily. 02/05/20   [provider]  FARXIGA 10 MG TABS tablet Take 10 mg by mouth daily. 03/16/20   [provider]  fluticasone (FLONASE) 50 MCG/ACT nasal spray PLACE 2 SPRAYS IN EACH NOSTRIL DAILY AS NEEDED FOR ALLERGIES 07/21/19   Ladell Pier, MD  gabapentin (NEURONTIN) 300 MG capsule TAKE 2 CAPSULES BY MOUTH TWICE DAILY 10/13/19   Ladell Pier, MD  insulin aspart (NOVOLOG FLEXPEN) 100 UNIT/ML FlexPen Inject 60 Units into the skin 3 (three) times daily with meals. 12/05/19   Ladell Pier, MD  Insulin Glargine (LANTUS SOLOSTAR) 100 UNIT/ML Solostar Pen Inject 84 Units into the skin daily. 10/13/19   Ladell Pier, MD  Insulin Pen Needle (TRUEPLUS PEN NEEDLES) 32G X 4 MM MISC Use to inject insulin. 10/13/18   Ladell Pier, MD   levothyroxine (SYNTHROID) 100 MCG tablet Take 1 tablet (100 mcg total)  by mouth daily. 10/13/19   Ladell Pier, MD  liraglutide (VICTOZA) 18 MG/3ML SOPN Inject 1.8 mg into the skin daily. 10/13/19   Ladell Pier, MD  lisinopril (ZESTRIL) 40 MG tablet TAKE 1 TABLET BY MOUTH DAILY 11/10/19   Ladell Pier, MD  mometasone (NASONEX) 50 MCG/ACT nasal spray Place 2 sprays into the nose daily. Patient taking differently: Place 2 sprays into the nose daily as needed (allergies).  08/24/17   Ladell Pier, MD  Multiple Vitamins-Minerals (EYE VITAMINS) CAPS Take 1 capsule by mouth daily. 08/11/12   Thurnell Lose, MD  NOVOFINE 32G X 6 MM MISC SMARTSIG:1 SUB-Q 4-5 Times Daily 01/22/20   [provider]  NYSTATIN powder APPLY TOPICALLY 4 TIMES DAILY. Patient taking differently: Apply 1 Bottle topically 4 (four) times daily as needed (rash).  01/18/17   Langeland, Leda Quail, MD  ondansetron (ZOFRAN) 4 MG tablet Take 1 tablet (4 mg total) by mouth every 6 (six) hours. 09/16/18   Darlin Drop P, PA-C  phenazopyridine (PYRIDIUM) 200 MG tablet Take 1 tablet (200 mg total) by mouth 3 (three) times daily. 07/17/19   Gareth Morgan, MD  predniSONE (DELTASONE) 5 MG tablet Take 6 pills for first day, 5 pills second day, 4 pills third day, 3 pills fourth day, 2 pills the fifth day, and 1 pill sixth day. 04/15/20   Rosemarie Ax, MD  spironolactone (ALDACTONE) 25 MG tablet TAKE 1 TABLET BY MOUTH EVERY MORNING AND 1/2 TABLET BY MOUTH EVERY EVENING 10/13/19   Ladell Pier, MD  torsemide (DEMADEX) 20 MG tablet Comments:   Filled Date: Apr 29 2019  1:31PM  Patient Notes: TAKE 1 TABLET BY MOUTH EVERY MORNING AS NEEDED. FLUID PILL  Duration: 90 05/09/18   [provider]  valACYclovir (VALTREX) 1000 MG tablet Take 1 tablet (1,000 mg total) by mouth 2 (two) times daily. 05/09/19   Charlott Rakes, MD    Allergies    Amoxicillin-pot clavulanate, Hydrocodone-acetaminophen, Oxycodone,  Sulfa antibiotics, Norvasc [amlodipine besylate], Sulfamethoxazole, and Tramadol  Review of Systems   Review of Systems All other systems reviewed and are negative except that which was mentioned in HPI  Physical Exam Updated Vital Signs BP (!) 115/61 (BP Location: Left Arm)   Pulse 84   Temp 98.5 F (36.9 C) (Oral)   Resp 18   Ht _0  (1.676 m)   Wt (!) 138.8 kg   SpO2 95%   BMI 49.39 kg/m   Physical Exam Vitals and nursing note reviewed.  Constitutional:      General: She is not in acute distress.    Appearance: She is well-developed.  HENT:     Head: Normocephalic and atraumatic.  Eyes:     Extraocular Movements: Extraocular movements intact.     Conjunctiva/sclera: Conjunctivae normal.     Pupils: Pupils are equal, round, and reactive to light.  Neck:     Comments: No midline cervical spine tenderness; right paraspinal muscle tenderness Cardiovascular:     Rate and Rhythm: Normal rate and regular rhythm.     Pulses: Normal pulses.     Heart sounds: Normal heart sounds. No murmur heard.   Pulmonary:     Effort: Pulmonary effort is normal.     Breath sounds: Normal breath sounds.  Abdominal:     General: Bowel sounds are normal. There is no distension.     Palpations: Abdomen is soft.     Tenderness: There is no abdominal tenderness.  Musculoskeletal:        General: Swelling and tenderness present.     Cervical back: Neck supple.     Comments: Edema and ecchymosis of R knee which is held in flexion, reluctance to extend 2/2 pain; faint ecchymosis palm of R hand and edema on radial side of dorsal R hand  Skin:    General: Skin is warm and dry.     Capillary Refill: Capillary refill takes less than 2 seconds.  Neurological:     Mental Status: She is alert and oriented to person, place, and time.     Sensory: No sensory deficit.     Comments: Fluent speech     ED Results / Procedures / Treatments   Labs (all labs ordered are listed, but only abnormal  results are displayed) Labs Reviewed - No data to display  EKG None  Radiology No results found.  Procedures Procedures (including critical care time)  Medications Ordered in ED Medications - No data to display  ED Course  I have reviewed the triage vital signs and the nursing notes.      MDM Rules/Calculators/A&P                          Alert and neurologically intact on exam.  I reviewed her ED visit from a week ago which showed reassuring CT scans and negative x-rays of extremities.  She has not had any further injuries or new complaints since 1 week ago and I do not feel that repeat imaging is indicated.  I have discussed expected course for her contusions and recommended that she follow-up with sports medicine for her right knee as she she may need MRI if swelling and pain do not improve.  I have reviewed return precautions and she voiced understanding. Final Clinical Impression(s) / ED Diagnoses Final diagnoses:  Contusion of right hand, subsequent encounter  Contusion of right knee, subsequent encounter  Sprain of cervical neck, subsequent encounter    Rx / DC Orders ED Discharge Orders    None       Mayetta Castleman, Wenda Overland, MD 04/15/20 1551

## 2020-04-15 NOTE — Patient Instructions (Signed)
NIce to meet you Please try the ace wrap  Please check your blood sugar with the prednisone. Let me know if it gets too high  Please try to not put any weight on the right knee  Please try ice as needed   Please send me a message in MyChart with any questions or updates.  Please see me back in 1 week.   --Dr. Raeford Razor

## 2020-04-15 NOTE — ED Triage Notes (Signed)
Pt here for recheck of her injuries from last week.  Pt continues to have swelling and difficulty moving to right hand and right knee.  Difficult ambulation.  Pt continues to have pain/stiffness in her neck.

## 2020-04-15 NOTE — Progress Notes (Signed)
Anita James - 69 y.o. female MRN [id number]  Date of birth: Jan 20, 1951  SUBJECTIVE:  Including CC & ROS.  Chief Complaint  Patient presents with  . Knee Injury    right   . Hand Injury    right    Anita James is a 69 y.o. female that is presenting with right knee pain, right hand pain, and a fall.  She has been evaluated in the emergency department on 7/20 and earlier today.  She had a fall where she tripped over the dog and hit her head on the tire rim and fell back and hit the back of her head.  Imaging has been negative for bleeds.  She has chronic kidney disease and unable to take certain medications.  She does have type 2 diabetes with her last A1c being 7.  She is on insulin.  Her pain in her right knee is anterior.  She has trouble with flexion and extension.  She has significant pain with bearing weight.  She has pain over the right hand.  She has ecchymosis in the palm of the right hand..  Independent review of the right knee x-ray from 7/20 shows chondrocalcinosis suggesting pseudogout. Independent review of the right hand x-ray from 7/20 shows no acute changes. Independent review of the CT cervical spine from 7/20 shows possible lordosis and degenerative changes at C4-5 and C5-6. Independent review of the CT head from 7/20 shows no acute changes.   Review of Systems See HPI   HISTORY: Past Medical, Surgical, Social, and Family History Reviewed & Updated per EMR.   Pertinent Historical Findings include:  Past Medical History:  Diagnosis Date  . Anxiety   . Carpal tunnel syndrome 06/14/2014  . Chest pain   . CHF (congestive heart failure) ([place])   . Chronic back pain   . Chronic kidney disease 03/2013   failure due to meds  . Diabetes mellitus type II, uncontrolled ([place])   . Diverticulitis   . Frequent headaches   . Hiatal hernia   . Hyperlipemia   . Hypertension   . Hypothyroidism   . 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 Neuropathy   . Obesity   . Pneumonia   .  Vitamin D deficiency 06/05/2014    Past Surgical History:  Procedure Laterality Date  . CESAREAN SECTION    . KNEE SURGERY Left   . THYROID SURGERY     goiter    Family History  Problem Relation Age of Onset  . Cancer Mother        cervical- mets  . Breast cancer Mother   . Cancer Father   . Heart disease Father   . Diabetes Father   . Diabetes Sister   . Breast cancer Sister   . Heart disease Sister   . Breast cancer Sister   . Heart disease Sister   . Breast cancer Sister   . Breast cancer Sister   . COPD Sister   . Breast cancer Sister   . Colon cancer Neg Hx   . Esophageal cancer Neg Hx   . Rectal cancer Neg Hx   . Stomach cancer Neg Hx     Social History   Socioeconomic History  . Marital status: Divorced    Spouse name: Not on file  . Number of children: 6  . Years of education: BA  . Highest education level: Not on file  Occupational History  . Occupation: unemployed  Tobacco Use  . Smoking status: Never  Smoker  . Smokeless tobacco: Never Used  Vaping Use  . Vaping Use: Never used  Substance and Sexual Activity  . Alcohol use: No  . Drug use: No  . Sexual activity: Not on file  Other Topics Concern  . Not on file  Social History Narrative  . Not on file   Social Determinants of Health   Financial Resource Strain:   . Difficulty of Paying Living Expenses:   Food Insecurity:   . Worried About Charity fundraiser in the Last Year:   . Arboriculturist in the Last Year:   Transportation Needs:   . Film/video editor (Medical):   Marland Kitchen Lack of Transportation (Non-Medical):   Physical Activity:   . Days of Exercise per Week:   . Minutes of Exercise per Session:   Stress:   . Feeling of Stress :   Social Connections:   . Frequency of Communication with Friends and Family:   . Frequency of Social Gatherings with Friends and Family:   . Attends Religious Services:   . Active Member of Clubs or Organizations:   . Attends Archivist  Meetings:   Marland Kitchen Marital Status:   Intimate Partner Violence:   . Fear of Current or Ex-Partner:   . Emotionally Abused:   Marland Kitchen Physically Abused:   . Sexually Abused:      PHYSICAL EXAM:  VS: BP 127/70   Pulse 65   Ht 5\' 6"  (1.676 m)   Wt (!) 306 lb (138.8 kg)   BMI 49.39 kg/m  Physical Exam Gen: NAD, alert, cooperative with exam, well-appearing MSK:  Right knee: Ecchymosis over the anterior aspect.  Limited extension. Limited flexion. Tenderness palpation over the prepatellar area. Right hand: Swelling of the dorsum of the hand. Unable to make a fist. No malrotation or misalignment. Ecchymosis over the palmar aspect. Neurovascularly intact  Limited ultrasound: Right hand/right knee:  Right knee: Mild effusion of the suprapatellar pouch. Quadricep and patellar tendon are intact. Large hematoma that is prepatellar in nature. Increased hyperemia over the lateral tibial plateau to suggest a fracture.  Right hand: No changes of the shaft of the metacarpals from 2nd-5th. Increased hyperemia of the third MCP joint.  Summary: Right knee with overlying hematoma and finding suggestive of nondisplaced tibial plateau fracture.  No fracture appreciated of the hand.  Ultrasound and interpretation by Clearance Coots, MD    ASSESSMENT & PLAN:   Acute pain of right knee Had a fall on 7/20.  Landed on her knee.  Since that time has a large hematoma that is over the anterior aspect.  No significant effusion in the suprapatellar pouch.  There is increased hyperemia over the lateral tibial plateau to suggest a nondisplaced fracture. -Counseled on home exercise therapy and supportive care. -Rollator. -Ace wrap. -Counseled on nonweightbearing. -Follow-up in 1 week.  Would reimage at that time.  May need to consider MRI.  May need to consider bone density.  Right hand pain Swelling over the dorsum of the hand.  Ecchymosis occurring in the palmar aspect.  No fractures appreciated on  ultrasound or x-ray.  Increased hyperemia of the joint which could suggest capsulitis. -Counseled on home exercise therapy and supportive care. -Prednisone.  Counseled on monitoring her blood sugar and informing me if it becomes significantly elevated. -May need to repeat imaging.

## 2020-04-15 NOTE — Assessment & Plan Note (Signed)
Had a fall on 7/20.  Landed on her knee.  Since that time has a large hematoma that is over the anterior aspect.  No significant effusion in the suprapatellar pouch.  There is increased hyperemia over the lateral tibial plateau to suggest a nondisplaced fracture. -Counseled on home exercise therapy and supportive care. -Rollator. -Ace wrap. -Counseled on nonweightbearing. -Follow-up in 1 week.  Would reimage at that time.  May need to consider MRI.  May need to consider bone density.

## 2020-04-16 ENCOUNTER — Telehealth: Payer: Self-pay | Admitting: Family Medicine

## 2020-04-16 NOTE — Telephone Encounter (Signed)
Patient called for a corrected order to be sent for Heavy Duty walker , says she weighs over 300 lbs & can't use the one provider ordered.  --Forwarding request to Dr. Raeford Razor for review & new Rx order to DME supply company.  -- Please call pt if there are any questions @ [phone]  --glh

## 2020-04-22 ENCOUNTER — Ambulatory Visit: Payer: Medicare HMO | Admitting: Family Medicine

## 2020-04-22 NOTE — Progress Notes (Deleted)
Israel Wunder - 69 y.o. female MRN [id number]  Date of birth: [birth year]  SUBJECTIVE:  Including CC & ROS.  No chief complaint on file.   Jerica Creegan is a 69 y.o. female that is  ***.  ***   Review of Systems See HPI   HISTORY: Past Medical, Surgical, Social, and Family History Reviewed & Updated per EMR.   Pertinent Historical Findings include:  Past Medical History:  Diagnosis Date  . Anxiety   . Carpal tunnel syndrome 06/14/2014  . Chest pain   . CHF (congestive heart failure) ([place])   . Chronic back pain   . Chronic kidney disease 03/2013   failure due to meds  . Diabetes mellitus type II, uncontrolled ([place])   . Diverticulitis   . Frequent headaches   . Hiatal hernia   . Hyperlipemia   . Hypertension   . Hypothyroidism   . 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 Neuropathy   . Obesity   . Pneumonia   . Vitamin D deficiency 06/05/2014    Past Surgical History:  Procedure Laterality Date  . CESAREAN SECTION    . KNEE SURGERY Left   . THYROID SURGERY     goiter    Family History  Problem Relation Age of Onset  . Cancer Mother        cervical- mets  . Breast cancer Mother   . Cancer Father   . Heart disease Father   . Diabetes Father   . Diabetes Sister   . Breast cancer Sister   . Heart disease Sister   . Breast cancer Sister   . Heart disease Sister   . Breast cancer Sister   . Breast cancer Sister   . COPD Sister   . Breast cancer Sister   . Colon cancer Neg Hx   . Esophageal cancer Neg Hx   . Rectal cancer Neg Hx   . Stomach cancer Neg Hx     Social History   Socioeconomic History  . Marital status: Divorced    Spouse name: Not on file  . Number of children: 6  . Years of education: BA  . Highest education level: Not on file  Occupational History  . Occupation: unemployed  Tobacco Use  . Smoking status: Never Smoker  . Smokeless tobacco: Never Used  Vaping Use  . Vaping Use: Never used  Substance and Sexual Activity  . Alcohol use: No    . Drug use: No  . Sexual activity: Not on file  Other Topics Concern  . Not on file  Social History Narrative  . Not on file   Social Determinants of Health   Financial Resource Strain:   . Difficulty of Paying Living Expenses:   Food Insecurity:   . Worried About Charity fundraiser in the Last Year:   . Arboriculturist in the Last Year:   Transportation Needs:   . Film/video editor (Medical):   Marland Kitchen Lack of Transportation (Non-Medical):   Physical Activity:   . Days of Exercise per Week:   . Minutes of Exercise per Session:   Stress:   . Feeling of Stress :   Social Connections:   . Frequency of Communication with Friends and Family:   . Frequency of Social Gatherings with Friends and Family:   . Attends Religious Services:   . Active Member of Clubs or Organizations:   . Attends Archivist Meetings:   Marland Kitchen Marital Status:   Intimate  Partner Violence:   . Fear of Current or Ex-Partner:   . Emotionally Abused:   Marland Kitchen Physically Abused:   . Sexually Abused:      PHYSICAL EXAM:  VS: There were no vitals taken for this visit. Physical Exam Gen: NAD, alert, cooperative with exam, well-appearing MSK:  ***      ASSESSMENT & PLAN:   No problem-specific Assessment & Plan notes found for this encounter.

## 2020-04-23 ENCOUNTER — Other Ambulatory Visit: Payer: Self-pay

## 2020-04-23 ENCOUNTER — Ambulatory Visit (INDEPENDENT_AMBULATORY_CARE_PROVIDER_SITE_OTHER): Payer: Medicare HMO | Admitting: Family Medicine

## 2020-04-23 ENCOUNTER — Telehealth: Payer: Self-pay | Admitting: Family Medicine

## 2020-04-23 ENCOUNTER — Encounter: Payer: Self-pay | Admitting: Family Medicine

## 2020-04-23 VITALS — BP 126/76 | HR 76 | Ht 66.0 in

## 2020-04-23 DIAGNOSIS — M25561 Pain in right knee: Secondary | ICD-10-CM | POA: Diagnosis not present

## 2020-04-23 NOTE — Progress Notes (Signed)
Ieesha Abbasi - 69 y.o. female MRN [id number]  Date of birth: Dec 03, 1950  SUBJECTIVE:  Including CC & ROS.  Chief Complaint  [name]    right knee    Shana Zavaleta is a 69 y.o. female that is following up for her right knee pain.  The pain has improved somewhat with the Rollator.  It is still significantly painful with any weightbearing.   Review of Systems See HPI   HISTORY: Past Medical, Surgical, Social, and Family History Reviewed & Updated per EMR.   Pertinent Historical Findings include:  Past Medical History:  Diagnosis Date  . Anxiety   . Carpal tunnel syndrome 06/14/2014  . Chest pain   . CHF (congestive heart failure) ([place])   . Chronic back pain   . Chronic kidney disease 03/2013   failure due to meds  . Diabetes mellitus type II, uncontrolled ([place])   . Diverticulitis   . Frequent headaches   . Hiatal hernia   . Hyperlipemia   . Hypertension   . Hypothyroidism   . 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 Neuropathy   . Obesity   . Pneumonia   . Vitamin D deficiency 06/05/2014    Past Surgical History:  Procedure Laterality Date  . CESAREAN SECTION    . KNEE SURGERY Left   . THYROID SURGERY     goiter    Family History  Problem Relation Age of Onset  . Cancer Mother        cervical- mets  . Breast cancer Mother   . Cancer Father   . Heart disease Father   . Diabetes Father   . Diabetes Sister   . Breast cancer Sister   . Heart disease Sister   . Breast cancer Sister   . Heart disease Sister   . Breast cancer Sister   . Breast cancer Sister   . COPD Sister   . Breast cancer Sister   . Colon cancer Neg Hx   . Esophageal cancer Neg Hx   . Rectal cancer Neg Hx   . Stomach cancer Neg Hx     Social History   Socioeconomic History  . Marital status: Divorced    Spouse name: Not on file  . Number of children: 6  . Years of education: BA  . Highest education level: Not on file  Occupational History  . Occupation:  unemployed  Tobacco Use  . Smoking status: Never Smoker  . Smokeless tobacco: Never Used  Vaping Use  . Vaping Use: Never used  Substance and Sexual Activity  . Alcohol use: No  . Drug use: No  . Sexual activity: Not on file  Other Topics Concern  . Not on file  Social History Narrative  . Not on file   Social Determinants of Health   Financial Resource Strain:   . Difficulty of Paying Living Expenses:   Food Insecurity:   . Worried About Charity fundraiser in the Last Year:   . Arboriculturist in the Last Year:   Transportation Needs:   . Film/video editor (Medical):   Marland Kitchen Lack of Transportation (Non-Medical):   Physical Activity:   . Days of Exercise per Week:   . Minutes of Exercise per Session:   Stress:   . Feeling of Stress :   Social Connections:   . Frequency of Communication with Friends and Family:   . Frequency of Social Gatherings with Friends and Family:   .  Attends Religious Services:   . Active Member of Clubs or Organizations:   . Attends Archivist Meetings:   Marland Kitchen Marital Status:   Intimate Partner Violence:   . Fear of Current or Ex-Partner:   . Emotionally Abused:   Marland Kitchen Physically Abused:   . Sexually Abused:      PHYSICAL EXAM:  VS: BP 126/76   Pulse 76   Ht 5\' 6"  (1.676 m)   BMI 49.39 kg/m  Physical Exam Gen: NAD, alert, cooperative with exam, well-appearing MSK:  Right leg: Effusion. Normal passive flexion extension. Tenderness over the lateral tibial plateau. Neurovascularly intact     ASSESSMENT & PLAN:   Acute pain of right knee Pain is still ongoing.  Has improved with nonweightbearing.  Still has significant pain on standing in the room.  Concern for possible fracture given the impact of her fall. -Counseled on supportive care. -Civil engineer, contracting. -MRI to evaluate for occult fracture.

## 2020-04-23 NOTE — Patient Instructions (Signed)
Good to see you Please try ice  Please continue compression   Please send me a message in MyChart with any questions or updates.  We will setup a virtual visit once the MRI is resulted.   --Dr. Raeford Razor

## 2020-04-23 NOTE — Assessment & Plan Note (Signed)
Pain is still ongoing.  Has improved with nonweightbearing.  Still has significant pain on standing in the room.  Concern for possible fracture given the impact of her fall. -Counseled on supportive care. -Civil engineer, contracting. -MRI to evaluate for occult fracture.

## 2020-04-23 NOTE — Telephone Encounter (Signed)
Pt ask if provider can order MRI to be done elsewhere so it can take place sooner rather than later due to significant knee pain & her being Diabetic she is worried.  --Forwarding request to provider for review.  --glh

## 2020-04-24 NOTE — Addendum Note (Signed)
Addended by: Sherrie George F on: 04/24/2020 12:00 PM   Modules accepted: Orders

## 2020-04-28 ENCOUNTER — Ambulatory Visit (INDEPENDENT_AMBULATORY_CARE_PROVIDER_SITE_OTHER): Payer: Medicare HMO

## 2020-04-28 ENCOUNTER — Other Ambulatory Visit: Payer: Self-pay

## 2020-04-28 DIAGNOSIS — M25561 Pain in right knee: Secondary | ICD-10-CM

## 2020-04-29 ENCOUNTER — Telehealth (INDEPENDENT_AMBULATORY_CARE_PROVIDER_SITE_OTHER): Payer: Medicare HMO | Admitting: Family Medicine

## 2020-04-29 ENCOUNTER — Telehealth: Payer: Self-pay | Admitting: Family Medicine

## 2020-04-29 DIAGNOSIS — M1120 Other chondrocalcinosis, unspecified site: Secondary | ICD-10-CM | POA: Diagnosis not present

## 2020-04-29 DIAGNOSIS — Z13820 Encounter for screening for osteoporosis: Secondary | ICD-10-CM | POA: Diagnosis not present

## 2020-04-29 DIAGNOSIS — M1711 Unilateral primary osteoarthritis, right knee: Secondary | ICD-10-CM | POA: Diagnosis not present

## 2020-04-29 NOTE — Assessment & Plan Note (Signed)
Observed on MRI.  Could contribute to pain in other joints.Marland Kitchen

## 2020-04-29 NOTE — Telephone Encounter (Signed)
Pt had MRI over weekend --Cld left msg for her to contact our office to set Appt for result review (Virtual)  --glh

## 2020-04-29 NOTE — Progress Notes (Signed)
Virtual Visit via Telephone Note  I connected with Rylyn Zawistowski on 04/29/20 at  3:50 PM EDT by telephone and verified that I am speaking with the correct person using two identifiers.   I discussed the limitations, risks, security and privacy concerns of performing an evaluation and management service by telephone and the availability of in person appointments. I also discussed with the patient that there may be a patient responsible charge related to this service. The patient expressed understanding and agreed to proceed.  Patient: home  Physician: office   History of Present Illness:  Ms. Yordy is a 69 year old female following up after MRI of her right knee.  It was demonstrating  lateral patellar retinacular tear as well as a prepatellar bursitis.  She has moderate cortical thinning and chondral defects along the medial femoral condyle and lateral femoral trochlear groove.  Shows effusion and Baker's cyst.  Also showing chondrocalcinosis  Observations/Objective:   Assessment and Plan:  OA knee:  Had a fall onto her knee and now having effusion and prepatellar bursitis with chondral thinning.  Has had improvement with nonweightbearing and compression. -Counseled on home exercise therapy and supportive care. -Could consider injection and physical therapy. -We will check bone density  Chondrocalcinosis: Observed on MRI.  Could contribute to pain in other joints..  Follow Up Instructions:    I discussed the assessment and treatment plan with the patient. The patient was provided an opportunity to ask questions and all were answered. The patient agreed with the plan and demonstrated an understanding of the instructions.   The patient was advised to call back or seek an in-person evaluation if the symptoms worsen or if the condition fails to improve as anticipated.  I provided 7 minutes of non-face-to-face time during this encounter.   Clearance Coots, MD

## 2020-04-29 NOTE — Assessment & Plan Note (Signed)
Had a fall onto her knee and now having effusion and prepatellar bursitis with chondral thinning.  Has had improvement with nonweightbearing and compression. -Counseled on home exercise therapy and supportive care. -Could consider injection and physical therapy. -We will check bone density

## 2020-05-02 ENCOUNTER — Ambulatory Visit (HOSPITAL_BASED_OUTPATIENT_CLINIC_OR_DEPARTMENT_OTHER)
Admission: RE | Admit: 2020-05-02 | Discharge: 2020-05-02 | Disposition: A | Discharge status: Home / Self Care | Payer: Medicare HMO | Source: Ambulatory Visit | Attending: Family Medicine | Admitting: Family Medicine

## 2020-05-02 ENCOUNTER — Other Ambulatory Visit: Payer: Self-pay

## 2020-05-02 DIAGNOSIS — E039 Hypothyroidism, unspecified: Secondary | ICD-10-CM | POA: Insufficient documentation

## 2020-05-02 DIAGNOSIS — Z780 Asymptomatic menopausal state: Secondary | ICD-10-CM | POA: Diagnosis not present

## 2020-05-02 DIAGNOSIS — M1711 Unilateral primary osteoarthritis, right knee: Secondary | ICD-10-CM | POA: Diagnosis not present

## 2020-05-02 DIAGNOSIS — Z13820 Encounter for screening for osteoporosis: Secondary | ICD-10-CM | POA: Diagnosis present

## 2020-05-06 ENCOUNTER — Telehealth: Payer: Self-pay | Admitting: Family Medicine

## 2020-05-06 NOTE — Telephone Encounter (Signed)
Left VM for patient. If she calls back please have her speak with a nurse/CMA and inform that her bone density shows osteopenia.  This means that she has some mild changes of the bone.  She does not have osteoporosis.  She needs to maximize her  2000 IU vitamin D and 1200 mg calcium.  She can do this by taking over-the-counter supplements..   If any questions then please take the best time and phone number to call and I will try to call her back.   Rosemarie Ax, MD [facility] 05/06/2020, 5:05 PM

## 2020-05-06 NOTE — Telephone Encounter (Signed)
Patient called seeking results of Bone Density results, advised provider has not review for release yet--  --Forward message to Dr. Raeford Razor. --glh

## 2020-05-08 ENCOUNTER — Telehealth: Payer: Self-pay | Admitting: Family Medicine

## 2020-05-08 NOTE — Telephone Encounter (Signed)
Left VM for patient. If she calls back please have her speak with a nurse/CMA and inform that her bone density shows osteopenia.  This means that she has some mild changes of the bone.  She does not have osteoporosis.  She needs to maximize her  2000 IU vitamin D and 1200 mg calcium.  She can do this by taking over-the-counter supplements..   If any questions then please take the best time and phone number to call and I will try to call her back.   Rosemarie Ax, MD [facility] 05/08/2020, 3:13 PM

## 2020-05-09 ENCOUNTER — Other Ambulatory Visit: Payer: Self-pay

## 2020-05-09 ENCOUNTER — Ambulatory Visit (INDEPENDENT_AMBULATORY_CARE_PROVIDER_SITE_OTHER): Payer: Medicare HMO | Admitting: Family Medicine

## 2020-05-09 ENCOUNTER — Ambulatory Visit: Payer: Self-pay

## 2020-05-09 ENCOUNTER — Encounter: Payer: Self-pay | Admitting: Family Medicine

## 2020-05-09 VITALS — Ht 66.0 in | Wt 320.0 lb

## 2020-05-09 DIAGNOSIS — M1711 Unilateral primary osteoarthritis, right knee: Secondary | ICD-10-CM | POA: Diagnosis not present

## 2020-05-09 DIAGNOSIS — M85852 Other specified disorders of bone density and structure, left thigh: Secondary | ICD-10-CM | POA: Insufficient documentation

## 2020-05-09 MED ORDER — KETOROLAC TROMETHAMINE 30 MG/ML IJ SOLN
30.0000 mg | Freq: Once | INTRAMUSCULAR | Status: AC
  Administered 2020-05-09: 30 mg via INTRA_ARTICULAR

## 2020-05-09 NOTE — Assessment & Plan Note (Signed)
Observed on most recent bone density.  Counseled on vitamin D and calcium.

## 2020-05-09 NOTE — Assessment & Plan Note (Addendum)
MRI was revealing for different areas degenerative changes.  Has had ongoing knee pain after her fall. -Counseled on supportive care. -Injection. -Keep her out of work 1 more week and try hinged knee brace.  She will call back to update on status and consider return to work.  Could consider steroid injection if little improvement with the Toradol injection.

## 2020-05-09 NOTE — Patient Instructions (Signed)
Good to see you Please continue ice  Please try an ace wrap   Please send me a message in MyChart with any questions or updates.  Please see me back in 4 weeks.   --Dr. Raeford Razor

## 2020-05-09 NOTE — Progress Notes (Signed)
Anita James - 69 y.o. female MRN [id number]  Date of birth: 08-Jul-1951  SUBJECTIVE:  Including CC & ROS.  Chief Complaint  Patient presents with  . Knee Pain    right    Anita James is a 69 y.o. female that is  Presenting with worsening of her right knee pain. Has been out of work. Pain is anterior and worse with weight bearing.   Review of Systems See HPI   HISTORY: Past Medical, Surgical, Social, and Family History Reviewed & Updated per EMR.   Pertinent Historical Findings include:  Past Medical History:  Diagnosis Date  . Anxiety   . Carpal tunnel syndrome 06/14/2014  . Chest pain   . CHF (congestive heart failure) ([place])   . Chronic back pain   . Chronic kidney disease 03/2013   failure due to meds  . Diabetes mellitus type II, uncontrolled ([place])   . Diverticulitis   . Frequent headaches   . Hiatal hernia   . Hyperlipemia   . Hypertension   . Hypothyroidism   . 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 Neuropathy   . Obesity   . Pneumonia   . Vitamin D deficiency 06/05/2014    Past Surgical History:  Procedure Laterality Date  . CESAREAN SECTION    . KNEE SURGERY Left   . THYROID SURGERY     goiter    Family History  Problem Relation Age of Onset  . Cancer Mother        cervical- mets  . Breast cancer Mother   . Cancer Father   . Heart disease Father   . Diabetes Father   . Diabetes Sister   . Breast cancer Sister   . Heart disease Sister   . Breast cancer Sister   . Heart disease Sister   . Breast cancer Sister   . Breast cancer Sister   . COPD Sister   . Breast cancer Sister   . Colon cancer Neg Hx   . Esophageal cancer Neg Hx   . Rectal cancer Neg Hx   . Stomach cancer Neg Hx     Social History   Socioeconomic History  . Marital status: Divorced    Spouse name: Not on file  . Number of children: 6  . Years of education: BA  . Highest education level: Not on file  Occupational History  . Occupation: unemployed  Tobacco Use  . Smoking  status: Never Smoker  . Smokeless tobacco: Never Used  Vaping Use  . Vaping Use: Never used  Substance and Sexual Activity  . Alcohol use: No  . Drug use: No  . Sexual activity: Not on file  Other Topics Concern  . Not on file  Social History Narrative  . Not on file   Social Determinants of Health   Financial Resource Strain:   . Difficulty of Paying Living Expenses: Not on file  Food Insecurity:   . Worried About Charity fundraiser in the Last Year: Not on file  . Ran Out of Food in the Last Year: Not on file  Transportation Needs:   . Lack of Transportation (Medical): Not on file  . Lack of Transportation (Non-Medical): Not on file  Physical Activity:   . Days of Exercise per Week: Not on file  . Minutes of Exercise per Session: Not on file  Stress:   . Feeling of Stress : Not on file  Social Connections:   . Frequency of Communication with Friends and  Family: Not on file  . Frequency of Social Gatherings with Friends and Family: Not on file  . Attends Religious Services: Not on file  . Active Member of Clubs or Organizations: Not on file  . Attends Archivist Meetings: Not on file  . Marital Status: Not on file  Intimate Partner Violence:   . Fear of Current or Ex-Partner: Not on file  . Emotionally Abused: Not on file  . Physically Abused: Not on file  . Sexually Abused: Not on file     PHYSICAL EXAM:  VS: Ht 5\' 6"  (1.676 m)   Wt (!) 320 lb (145.2 kg)   BMI 51.65 kg/m  Physical Exam Gen: NAD, alert, cooperative with exam, well-appearing    Aspiration/Injection Procedure Note Anita James 04-13-1951  Procedure: Injection Indications: right knee pain    Procedure Details Consent: Risks of procedure as well as the alternatives and risks of each were explained to the (patient/caregiver).  Consent for procedure obtained. Time Out: Verified patient identification, verified procedure, site/side was marked, verified correct patient position, special  equipment/implants available, medications/allergies/relevent history reviewed, required imaging and test results available.  Performed.  The area was cleaned with iodine and alcohol swabs.    The right knee superior lateral suprapatellar pouch was injected 5 cc of 1% lidocaine on a 22-gauge 1-1/2 inch needle.  The syringe was switched and a mixture containing  using 1 cc's of 30 mg Toradol and 4 cc's of 0.25% bupivacaine was injected.  Ultrasound was used. Images were obtained in long views showing the injection.     A sterile dressing was applied.  Patient did tolerate procedure well.     ASSESSMENT & PLAN:   OA (osteoarthritis) of knee MRI was revealing for different areas degenerative changes.  Has had ongoing knee pain after her fall. -Counseled on supportive care. -Injection. -Keep her out of work 1 more week and try hinged knee brace.  She will call back to update on status and consider return to work.  Could consider steroid injection if little improvement with the Toradol injection.  Osteopenia of neck of left femur Observed on most recent bone density.  Counseled on vitamin D and calcium.

## 2020-05-17 ENCOUNTER — Telehealth: Payer: Self-pay | Admitting: Family Medicine

## 2020-05-17 NOTE — Telephone Encounter (Signed)
Patient called to say since Rt knee injection last week ( knee is swelling has greatly subsided, still a little scrubbing feeling when in motion but feels a lot better)  --Patient wishes provider to advise if appt needed to examine for release to RTW clearance .  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   --forwarding message to provider.  --glh

## 2020-05-17 NOTE — Telephone Encounter (Signed)
Left VM for patient. If she calls back please have her speak with a nurse/CMA and inform that if she is able to walk without a walker and has no pain and we can release her to back to work.  If she is still having pain with ambulation that we need to hold off for a little while longer..   If any questions then please take the best time and phone number to call and I will try to call her back.   Rosemarie Ax, MD [facility] 05/17/2020, 12:34 PM

## 2020-05-21 ENCOUNTER — Encounter: Payer: Self-pay | Admitting: Family Medicine

## 2020-05-21 ENCOUNTER — Other Ambulatory Visit: Payer: Self-pay

## 2020-05-21 ENCOUNTER — Ambulatory Visit (INDEPENDENT_AMBULATORY_CARE_PROVIDER_SITE_OTHER): Payer: Medicare HMO | Admitting: Family Medicine

## 2020-05-21 DIAGNOSIS — M1711 Unilateral primary osteoarthritis, right knee: Secondary | ICD-10-CM

## 2020-05-21 MED ORDER — LIDOCAINE 5 % EX PTCH
1.0000 | MEDICATED_PATCH | Freq: Two times a day (BID) | CUTANEOUS | 2 refills | Status: DC
Start: 2020-05-21 — End: 2020-07-26

## 2020-05-21 NOTE — Patient Instructions (Signed)
Good to see you  Please try the heat on the front of the knee  Please try the patches  Please send me a message in MyChart with any questions or updates.  Please see me back in 4 weeks.   --Dr. Raeford Razor

## 2020-05-21 NOTE — Assessment & Plan Note (Signed)
Pain is improved since the injection.  She feels she is able to walk without much pain. -Counseled on supportive care. -Lidoderm patches. -OA reaction brace. -Provided work note. -Could consider physical therapy.

## 2020-05-21 NOTE — Progress Notes (Signed)
Anita James - 69 y.o. female MRN [id number]  Date of birth: 03-Aug-1951  SUBJECTIVE:  Including CC & ROS.  Chief Complaint  [name]    right knee    Anita James is a 69 y.o. female that is following up for her right knee pain.  She has done well since the Toradol injection.  Pain is intermittent in nature.  Denies any mechanical symptoms.  Injury was about 6 weeks ago.   Review of Systems See HPI   HISTORY: Past Medical, Surgical, Social, and Family History Reviewed & Updated per EMR.   Pertinent Historical Findings include:  Past Medical History:  Diagnosis Date  . Anxiety   . Carpal tunnel syndrome 06/14/2014  . Chest pain   . CHF (congestive heart failure) ([place])   . Chronic back pain   . Chronic kidney disease 03/2013   failure due to meds  . Diabetes mellitus type II, uncontrolled ([place])   . Diverticulitis   . Frequent headaches   . Hiatal hernia   . Hyperlipemia   . Hypertension   . Hypothyroidism   . 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 Neuropathy   . Obesity   . Pneumonia   . Vitamin D deficiency 06/05/2014    Past Surgical History:  Procedure Laterality Date  . CESAREAN SECTION    . KNEE SURGERY Left   . THYROID SURGERY     goiter    Family History  Problem Relation Age of Onset  . Cancer Mother        cervical- mets  . Breast cancer Mother   . Cancer Father   . Heart disease Father   . Diabetes Father   . Diabetes Sister   . Breast cancer Sister   . Heart disease Sister   . Breast cancer Sister   . Heart disease Sister   . Breast cancer Sister   . Breast cancer Sister   . COPD Sister   . Breast cancer Sister   . Colon cancer Neg Hx   . Esophageal cancer Neg Hx   . Rectal cancer Neg Hx   . Stomach cancer Neg Hx     Social History   Socioeconomic History  . Marital status: Divorced    Spouse name: Not on file  . Number of children: 6  . Years of education: BA  . Highest education level: Not on file   Occupational History  . Occupation: unemployed  Tobacco Use  . Smoking status: Never Smoker  . Smokeless tobacco: Never Used  Vaping Use  . Vaping Use: Never used  Substance and Sexual Activity  . Alcohol use: No  . Drug use: No  . Sexual activity: Not on file  Other Topics Concern  . Not on file  Social History Narrative  . Not on file   Social Determinants of Health   Financial Resource Strain:   . Difficulty of Paying Living Expenses: Not on file  Food Insecurity:   . Worried About Charity fundraiser in the Last Year: Not on file  . Ran Out of Food in the Last Year: Not on file  Transportation Needs:   . Lack of Transportation (Medical): Not on file  . Lack of Transportation (Non-Medical): Not on file  Physical Activity:   . Days of Exercise per Week: Not on file  . Minutes of Exercise per Session: Not on file  Stress:   . Feeling of Stress : Not on file  Social Connections:   . Frequency of Communication with Friends and Family: Not on file  . Frequency of Social Gatherings with Friends and Family: Not on file  . Attends Religious Services: Not on file  . Active Member of Clubs or Organizations: Not on file  . Attends Archivist Meetings: Not on file  . Marital Status: Not on file  Intimate Partner Violence:   . Fear of Current or Ex-Partner: Not on file  . Emotionally Abused: Not on file  . Physically Abused: Not on file  . Sexually Abused: Not on file     PHYSICAL EXAM:  VS: BP 104/67   Pulse 87   Ht 5\' 6"  (1.676 m)   BMI 51.65 kg/m  Physical Exam Gen: NAD, alert, cooperative with exam, well-appearing MSK:  Right knee: No obvious effusion. Tenderness palpation over the hematoma over the anterior aspect. Normal range of motion. Normal stability. Neurovascularly intact     ASSESSMENT & PLAN:   OA (osteoarthritis) of knee Pain is improved since the injection.  She feels she is able to walk without much pain. -Counseled on supportive  care. -Lidoderm patches. -OA reaction brace. -Provided work note. -Could consider physical therapy.

## 2020-06-06 ENCOUNTER — Ambulatory Visit: Payer: Medicare HMO | Admitting: Family Medicine

## 2020-06-06 NOTE — Progress Notes (Deleted)
Anita James - 69 y.o. female MRN [id number]  Date of birth: 29-Mar-1951  SUBJECTIVE:  Including CC & ROS.  No chief complaint on file.   Anita James is a 69 y.o. female that is  ***.  ***   Review of Systems See HPI   HISTORY: Past Medical, Surgical, Social, and Family History Reviewed & Updated per EMR.   Pertinent Historical Findings include:  Past Medical History:  Diagnosis Date  . Anxiety   . Carpal tunnel syndrome 06/14/2014  . Chest pain   . CHF (congestive heart failure) ([place])   . Chronic back pain   . Chronic kidney disease 03/2013   failure due to meds  . Diabetes mellitus type II, uncontrolled ([place])   . Diverticulitis   . Frequent headaches   . Hiatal hernia   . Hyperlipemia   . Hypertension   . Hypothyroidism   . 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 Neuropathy   . Obesity   . Pneumonia   . Vitamin D deficiency 06/05/2014    Past Surgical History:  Procedure Laterality Date  . CESAREAN SECTION    . KNEE SURGERY Left   . THYROID SURGERY     goiter    Family History  Problem Relation Age of Onset  . Cancer Mother        cervical- mets  . Breast cancer Mother   . Cancer Father   . Heart disease Father   . Diabetes Father   . Diabetes Sister   . Breast cancer Sister   . Heart disease Sister   . Breast cancer Sister   . Heart disease Sister   . Breast cancer Sister   . Breast cancer Sister   . COPD Sister   . Breast cancer Sister   . Colon cancer Neg Hx   . Esophageal cancer Neg Hx   . Rectal cancer Neg Hx   . Stomach cancer Neg Hx     Social History   Socioeconomic History  . Marital status: Divorced    Spouse name: Not on file  . Number of children: 6  . Years of education: BA  . Highest education level: Not on file  Occupational History  . Occupation: unemployed  Tobacco Use  . Smoking status: Never Smoker  . Smokeless tobacco: Never Used  Vaping Use  . Vaping Use: Never used  Substance and Sexual Activity  . Alcohol use: No    . Drug use: No  . Sexual activity: Not on file  Other Topics Concern  . Not on file  Social History Narrative  . Not on file   Social Determinants of Health   Financial Resource Strain:   . Difficulty of Paying Living Expenses: Not on file  Food Insecurity:   . Worried About Charity fundraiser in the Last Year: Not on file  . Ran Out of Food in the Last Year: Not on file  Transportation Needs:   . Lack of Transportation (Medical): Not on file  . Lack of Transportation (Non-Medical): Not on file  Physical Activity:   . Days of Exercise per Week: Not on file  . Minutes of Exercise per Session: Not on file  Stress:   . Feeling of Stress : Not on file  Social Connections:   . Frequency of Communication with Friends and Family: Not on file  . Frequency of Social Gatherings with Friends and Family: Not on file  . Attends Religious Services: Not on file  .  Active Member of Clubs or Organizations: Not on file  . Attends Archivist Meetings: Not on file  . Marital Status: Not on file  Intimate Partner Violence:   . Fear of Current or Ex-Partner: Not on file  . Emotionally Abused: Not on file  . Physically Abused: Not on file  . Sexually Abused: Not on file     PHYSICAL EXAM:  VS: There were no vitals taken for this visit. Physical Exam Gen: NAD, alert, cooperative with exam, well-appearing MSK:  ***      ASSESSMENT & PLAN:   No problem-specific Assessment & Plan notes found for this encounter.

## 2020-06-11 ENCOUNTER — Other Ambulatory Visit: Payer: Self-pay

## 2020-06-11 ENCOUNTER — Encounter: Payer: Self-pay | Admitting: Family Medicine

## 2020-06-11 ENCOUNTER — Ambulatory Visit (INDEPENDENT_AMBULATORY_CARE_PROVIDER_SITE_OTHER): Payer: Medicare HMO | Admitting: Family Medicine

## 2020-06-11 VITALS — BP 128/83 | HR 79 | Ht 66.0 in | Wt 330.0 lb

## 2020-06-11 DIAGNOSIS — M1711 Unilateral primary osteoarthritis, right knee: Secondary | ICD-10-CM

## 2020-06-11 NOTE — Progress Notes (Signed)
Anita James - 69 y.o. female MRN [id number]  Date of birth: Jan 23, 1951  SUBJECTIVE:  Including CC & ROS.  Chief Complaint  [name]    right knee    Anita James is a 69 y.o. female that is following up for right knee pain.  Pain continues to improve and her function improves as well.   Review of Systems See HPI   HISTORY: Past Medical, Surgical, Social, and Family History Reviewed & Updated per EMR.   Pertinent Historical Findings include:  Past Medical History:  Diagnosis Date  . Anxiety   . Carpal tunnel syndrome 06/14/2014  . Chest pain   . CHF (congestive heart failure) ([place])   . Chronic back pain   . Chronic kidney disease 03/2013   failure due to meds  . Diabetes mellitus type II, uncontrolled ([place])   . Diverticulitis   . Frequent headaches   . Hiatal hernia   . Hyperlipemia   . Hypertension   . Hypothyroidism   . 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 Neuropathy   . Obesity   . Pneumonia   . Vitamin D deficiency 06/05/2014    Past Surgical History:  Procedure Laterality Date  . CESAREAN SECTION    . KNEE SURGERY Left   . THYROID SURGERY     goiter    Family History  Problem Relation Age of Onset  . Cancer Mother        cervical- mets  . Breast cancer Mother   . Cancer Father   . Heart disease Father   . Diabetes Father   . Diabetes Sister   . Breast cancer Sister   . Heart disease Sister   . Breast cancer Sister   . Heart disease Sister   . Breast cancer Sister   . Breast cancer Sister   . COPD Sister   . Breast cancer Sister   . Colon cancer Neg Hx   . Esophageal cancer Neg Hx   . Rectal cancer Neg Hx   . Stomach cancer Neg Hx     Social History   Socioeconomic History  . Marital status: Divorced    Spouse name: Not on file  . Number of children: 6  . Years of education: BA  . Highest education level: Not on file  Occupational History  . Occupation: unemployed  Tobacco Use  . Smoking status: Never Smoker   . Smokeless tobacco: Never Used  Vaping Use  . Vaping Use: Never used  Substance and Sexual Activity  . Alcohol use: No  . Drug use: No  . Sexual activity: Not on file  Other Topics Concern  . Not on file  Social History Narrative  . Not on file   Social Determinants of Health   Financial Resource Strain:   . Difficulty of Paying Living Expenses: Not on file  Food Insecurity:   . Worried About Charity fundraiser in the Last Year: Not on file  . Ran Out of Food in the Last Year: Not on file  Transportation Needs:   . Lack of Transportation (Medical): Not on file  . Lack of Transportation (Non-Medical): Not on file  Physical Activity:   . Days of Exercise per Week: Not on file  . Minutes of Exercise per Session: Not on file  Stress:   . Feeling of Stress : Not on file  Social Connections:   . Frequency of Communication with Friends and Family: Not on file  .  Frequency of Social Gatherings with Friends and Family: Not on file  . Attends Religious Services: Not on file  . Active Member of Clubs or Organizations: Not on file  . Attends Archivist Meetings: Not on file  . Marital Status: Not on file  Intimate Partner Violence:   . Fear of Current or Ex-Partner: Not on file  . Emotionally Abused: Not on file  . Physically Abused: Not on file  . Sexually Abused: Not on file     PHYSICAL EXAM:  VS: BP 128/83   Pulse 79   Ht 5\' 6"  (1.676 m)   Wt (!) 330 lb (149.7 kg)   BMI 53.26 kg/m  Physical Exam Gen: NAD, alert, cooperative with exam, well-appearing MSK:  Right knee: No effusion. Normal range of motion. No instability. Neurovascular intact     ASSESSMENT & PLAN:   OA (osteoarthritis) of knee Continues to improve.  Is slowly regaining function and strength. -Counseled on home exercise therapy and supportive care. -Brace. -Shower chair. -Could consider physical therapy.

## 2020-06-11 NOTE — Patient Instructions (Signed)
Good to see you Please continue work on the exercises  Please try heat and ice   Please send me a message in MyChart with any questions or updates.  Please see me back in 6-8 weeks.   --Dr. Raeford Razor

## 2020-06-11 NOTE — Assessment & Plan Note (Signed)
Continues to improve.  Is slowly regaining function and strength. -Counseled on home exercise therapy and supportive care. -Brace. -Shower chair. -Could consider physical therapy.

## 2020-07-01 ENCOUNTER — Telehealth: Payer: Self-pay | Admitting: Family Medicine

## 2020-07-01 NOTE — Telephone Encounter (Signed)
Patient called while office clsd for lunch, requested RTW letter be sent to her employer @ fax # [phone] & attn of Joycelyn Schmid @ [employer](t)--couldn't understand pronunciation .  --forwarding requested information .  --FYI.  -glh

## 2020-07-25 ENCOUNTER — Other Ambulatory Visit: Payer: Self-pay | Admitting: Family Medicine

## 2020-07-26 ENCOUNTER — Telehealth: Payer: Self-pay | Admitting: Family Medicine

## 2020-07-26 MED ORDER — LIDOCAINE 5 % EX PTCH: 1.0000 | MEDICATED_PATCH | Freq: Two times a day (BID) | CUTANEOUS | 2 refills | Status: DC

## 2020-07-26 NOTE — Telephone Encounter (Signed)
Patient called states [facility] was suppose to send Rx refill fr Lidocaine. ---------Pt ask if there is a STRONGER Strength of Medication Patch, if so She wants it instead of what is given below  :   lidocaine (LIDODERM) 5 % [[id number]]   Order Details Dose: 1 patch Route: Transdermal Frequency: Every 12 hours  Dispense Quantity: 30 patch Refills: 2       Sig: Place 1 patch onto the skin every 12 (twelve) hours. Remove & Discard patch within 12 hours or as directed by MD       Pt uses :   [id number] - [place], [place] - [address]  [address] [place], [place] [address]  Phone:  [phone] Fax:  [phone]  --forward refill request to med asst for Approval /review with Provider.  --glh

## 2020-07-26 NOTE — Telephone Encounter (Signed)
Forwarding Rx refill request to Dr.Schmitz due to pt request a Stronger Strength of Lidocaine patch Rx  --glh

## 2020-07-26 NOTE — Telephone Encounter (Signed)
Refilled lidoderm patches.   Rosemarie Ax, MD [facility] 07/26/2020, 1:37 PM

## 2020-07-30 ENCOUNTER — Ambulatory Visit (HOSPITAL_COMMUNITY)
Admission: EM | Admit: 2020-07-30 | Discharge: 2020-07-30 | Disposition: A | Discharge status: Home / Self Care | Payer: Medicare HMO

## 2020-07-30 ENCOUNTER — Other Ambulatory Visit: Payer: Self-pay

## 2020-08-01 ENCOUNTER — Other Ambulatory Visit: Payer: Self-pay | Admitting: Internal Medicine

## 2020-08-01 DIAGNOSIS — E1142 Type 2 diabetes mellitus with diabetic polyneuropathy: Secondary | ICD-10-CM

## 2020-08-01 DIAGNOSIS — Z794 Long term (current) use of insulin: Secondary | ICD-10-CM

## 2020-08-13 ENCOUNTER — Telehealth: Payer: Self-pay | Admitting: Internal Medicine

## 2020-08-13 NOTE — Telephone Encounter (Signed)
Copied from [place] [id number]. Topic: General - Other >> Aug 09, 2020  1:58 PM Celene Kras wrote: Reason for CRM: Pt called stating that she received a call from the office with no VM and is requesting a call back. Please advise.

## 2020-09-17 ENCOUNTER — Other Ambulatory Visit: Payer: Self-pay | Admitting: Internal Medicine

## 2020-09-18 NOTE — Telephone Encounter (Signed)
Requested medication (s) are due for refill today: yes   Requested medication (s) are on the active medication list: yes  Last refill:  10/13/19 #270 3 refills   Future visit scheduled: no   Notes to clinic:  no valid encounter in 6 months, last seen 11 months ago. Patient out of meds x 2 weeks. Last B/P documented 06/11/20 128/83 HR 79. Called patient to scheduled appt. No answer, left message to call clinic to set up appt. Do you want courtesy refill?     Requested Prescriptions  Pending Prescriptions Disp Refills   carvedilol (COREG) 12.5 MG tablet [Pharmacy Med Name: CARVEDILOL 12.5MG  TABLETS] 270 tablet 3    Sig: TAKE 1 AND 1/2 TABLETS(18.75 MG) BY MOUTH TWICE DAILY WITH A MEAL      Cardiovascular:  Beta Blockers Failed - 09/18/2020 11:15 AM      Failed - Valid encounter within last 6 months    Recent Outpatient Visits           11 months ago Type 2 diabetes mellitus with diabetic polyneuropathy, with long-term current use of insulin (HCC)   [facility] Marcine Matar, MD   1 year ago Type 2 diabetes, uncontrolled, with neuropathy [facility])   [facility] Hoy Register, MD   1 year ago Appointment canceled by hospital   [facility] Marcine Matar, MD   1 year ago Type 2 diabetes mellitus, uncontrolled, with neuropathy [facility])   [facility] Marcine Matar, MD   1 year ago Type 2 diabetes mellitus, uncontrolled, with neuropathy [facility]   [facility] Marcine Matar, MD                Passed - Last BP in normal range    BP Readings from Last 1 Encounters:  06/11/20 128/83          Passed - Last Heart Rate in normal range    Pulse Readings from Last 1 Encounters:  06/11/20 79

## 2020-09-18 NOTE — Telephone Encounter (Signed)
Will forward to covering provider.

## 2020-09-18 NOTE — Telephone Encounter (Signed)
Pt is calling checking on the status of bp med. Pt has been out for about 2 wks. Pt would like a callback

## 2020-09-18 NOTE — Telephone Encounter (Signed)
Attempted to contact patient to set up appt. No answer, left voicemail to call clinic to schedule appt. See Rx request coreg due to no valid encounter within 6 months per protocol.

## 2020-09-24 ENCOUNTER — Telehealth: Payer: Self-pay | Admitting: Internal Medicine

## 2020-09-24 ENCOUNTER — Telehealth: Payer: Self-pay

## 2020-09-24 NOTE — Telephone Encounter (Signed)
Returned pt call and made aware that I am unable to refill medication because she has not been seen in a year. Pt states she has tried to get an appt but was unable to gt in so she has been going to [facility]. Pt states that is her new pcp.

## 2020-09-24 NOTE — Telephone Encounter (Signed)
Copied from CRM [id number]. Topic: General - Inquiry >> Sep 23, 2020  4:21 PM Adrian Prince D wrote: Reason for CRM: [facility] called and would like a call back in reference to one of this patients medications. Please call Nita Sickle at [phone]

## 2020-09-24 NOTE — Telephone Encounter (Signed)
Copied from CRM [id number]. Topic: General - Other >> Sep 23, 2020  4:23 PM Tynisha, Ogan A wrote: Reason for CRM: Patient called in asking for a call from Dr Laural Benes to give her a call back in regards to her medication carvedilol (COREG) 12.5 MG tablet stated that its been a month since she last took this pill and need the refill right away. [facility] to be reached at Ph# [phone]

## 2020-09-26 NOTE — Telephone Encounter (Signed)
Returned Nita Sickle call in regards to pt lvm asking for a call back at her earliest convenience

## 2020-09-30 ENCOUNTER — Other Ambulatory Visit: Payer: Self-pay

## 2020-09-30 ENCOUNTER — Ambulatory Visit (INDEPENDENT_AMBULATORY_CARE_PROVIDER_SITE_OTHER): Payer: Medicare HMO | Admitting: Family Medicine

## 2020-09-30 ENCOUNTER — Ambulatory Visit: Payer: Self-pay

## 2020-09-30 VITALS — BP 170/98 | Ht 66.0 in | Wt 340.0 lb

## 2020-09-30 DIAGNOSIS — M170 Bilateral primary osteoarthritis of knee: Secondary | ICD-10-CM

## 2020-09-30 MED ORDER — TRIAMCINOLONE ACETONIDE 40 MG/ML IJ SUSP
40.0000 mg | Freq: Once | INTRAMUSCULAR | Status: AC
  Administered 2020-09-30: 40 mg via INTRA_ARTICULAR

## 2020-09-30 MED ORDER — LIDOCAINE 5 % EX PTCH: 1.0000 | MEDICATED_PATCH | Freq: Two times a day (BID) | CUTANEOUS | 2 refills | Status: DC

## 2020-09-30 NOTE — Progress Notes (Signed)
Anita James - 70 y.o. female MRN [id number]  Date of birth: August 13, 1951  SUBJECTIVE:  Including CC & ROS.  No chief complaint on file.   Anita James is a 70 y.o. female that is presenting with bilateral knee pain.  The pain is acute on chronic in nature.  She has a history right knee pain more recent.  The pain is worse with getting up from a seated position.  Denies any falls.  It is worse the longer she is on her feet.  No mechanical symptoms.  Independent review of the left knee x-ray from 2017 shows moderate medial joint space narrowing.  Demonstrating chondrocalcinosis.   Review of Systems See HPI   HISTORY: Past Medical, Surgical, Social, and Family History Reviewed & Updated per EMR.   Pertinent Historical Findings include:  Past Medical History:  Diagnosis Date  . Anxiety   . Carpal tunnel syndrome 06/14/2014  . Chest pain   . CHF (congestive heart failure) ([place])   . Chronic back pain   . Chronic kidney disease 03/2013   failure due to meds  . Diabetes mellitus type II, uncontrolled ([place])   . Diverticulitis   . Frequent headaches   . Hiatal hernia   . Hyperlipemia   . Hypertension   . Hypothyroidism   . 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 Neuropathy   . Obesity   . Pneumonia   . Vitamin D deficiency 06/05/2014    Past Surgical History:  Procedure Laterality Date  . CESAREAN SECTION    . KNEE SURGERY Left   . THYROID SURGERY     goiter    Family History  Problem Relation Age of Onset  . Cancer Mother        cervical- mets  . Breast cancer Mother   . Cancer Father   . Heart disease Father   . Diabetes Father   . Diabetes Sister   . Breast cancer Sister   . Heart disease Sister   . Breast cancer Sister   . Heart disease Sister   . Breast cancer Sister   . Breast cancer Sister   . COPD Sister   . Breast cancer Sister   . Colon cancer Neg Hx   . Esophageal cancer Neg Hx   . Rectal cancer Neg Hx   . Stomach cancer Neg Hx     Social History    Socioeconomic History  . Marital status: Divorced    Spouse name: Not on file  . Number of children: 6  . Years of education: BA  . Highest education level: Not on file  Occupational History  . Occupation: unemployed  Tobacco Use  . Smoking status: Never Smoker  . Smokeless tobacco: Never Used  Vaping Use  . Vaping Use: Never used  Substance and Sexual Activity  . Alcohol use: No  . Drug use: No  . Sexual activity: Not on file  Other Topics Concern  . Not on file  Social History Narrative  . Not on file   Social Determinants of Health   Financial Resource Strain: Not on file  Food Insecurity: Not on file  Transportation Needs: Not on file  Physical Activity: Not on file  Stress: Not on file  Social Connections: Not on file  Intimate Partner Violence: Not on file     PHYSICAL EXAM:  VS: BP (!) 170/98   Ht 5\' 6"  (1.676 m)   Wt (!) 340 lb (154.2 kg)   BMI 54.88 kg/m  Physical Exam Gen: NAD, alert, cooperative with exam, well-appearing MSK:  Right and left knee: No effusion. Normal strength resistance. Instability with valgus varus stress testing. Neurovascular intact   Aspiration/Injection Procedure Note Yianna Tersigni 03/28/1951  Procedure: Injection Indications: Left knee pain  Procedure Details Consent: Risks of procedure as well as the alternatives and risks of each were explained to the (patient/caregiver).  Consent for procedure obtained. Time Out: Verified patient identification, verified procedure, site/side was marked, verified correct patient position, special equipment/implants available, medications/allergies/relevent history reviewed, required imaging and test results available.  Performed.  The area was cleaned with iodine and alcohol swabs.    The left knee superior lateral suprasellar pouch was injected using 3 cc of 1% lidocaine on a 21-gauge 2 inch needle.  The syringe was switched to mesh containing 1 cc's of 40 mg Kenalog and 4 cc's of  0.25% bupivacaine was injected.  Ultrasound was used. Images were obtained in long views showing the injection.     A sterile dressing was applied.  Patient did tolerate procedure well.  Aspiration/Injection Procedure Note Ji Fairburn 1951-08-01  Procedure: Injection Indications: Right knee pain  Procedure Details Consent: Risks of procedure as well as the alternatives and risks of each were explained to the (patient/caregiver).  Consent for procedure obtained. Time Out: Verified patient identification, verified procedure, site/side was marked, verified correct patient position, special equipment/implants available, medications/allergies/relevent history reviewed, required imaging and test results available.  Performed.  The area was cleaned with iodine and alcohol swabs.    The right knee superior lateral suprasellar pouch was injected using 3 cc of 1% lidocaine on a 21-gauge 2 inch needle.  The syringe was switched to mesh containing 1 cc's of 40 mg Kenalog and 4 cc's of 0.25% bupivacaine was injected.  Ultrasound was used. Images were obtained in long views showing the injection.     A sterile dressing was applied.  Patient did tolerate procedure well.   ASSESSMENT & PLAN:   OA (osteoarthritis) of knee Acute on chronic in nature.  -Counseled on home exercise therapy and supportive care - b/l injections.  -Could consider physical therapy or gel injections.

## 2020-09-30 NOTE — Patient Instructions (Signed)
Good to see you Please try ice  Please try tylenol   Please send me a message in MyChart with any questions or updates.  Please see me back in 4 weeks.   --Dr. Raeford Razor

## 2020-09-30 NOTE — Assessment & Plan Note (Addendum)
Acute on chronic in nature.  -Counseled on home exercise therapy and supportive care - b/l injections.  -Could consider physical therapy or gel injections.

## 2020-10-22 ENCOUNTER — Telehealth: Payer: Self-pay | Admitting: *Deleted

## 2020-10-22 NOTE — Telephone Encounter (Signed)
Copied from [place] [id number]. Topic: General - Other >> Oct 22, 2020  1:04 PM Celene Kras wrote: Reason for CRM: Pt calling and is requesting to speak with PCP nurse. She states that it is urgent. Pt would not go into further detail. Please advise.

## 2020-10-22 NOTE — Telephone Encounter (Signed)
Returned pt call pt didn't answer lvm  

## 2020-10-25 NOTE — Telephone Encounter (Signed)
Returned pt call. Pt states that Dr. Wynetta Emery write a letter for her with her dx's because she got a ticket for not wearing her seat belt. Pt states she is just needing that letter to go with all her other letters that her new pcp and cardiologist will be writing. Pt states she got another ticker for not having her seat belt on properly. Made pt aware that I will print letter out. Pt states she would like letter sent to her in the mail. Made pt aware that I will put letter in the mail  on Monday. Pt states she understands and doesn't have any questions or concerns

## 2020-10-25 NOTE — Telephone Encounter (Signed)
Pt returned call, very emotionally overwhelmed/distressed. Tried calling office, when I returned to the line the patient did not respond... she may have taken another call waiting to be connected to office.

## 2020-10-31 ENCOUNTER — Other Ambulatory Visit: Payer: Self-pay | Admitting: Internal Medicine

## 2020-10-31 DIAGNOSIS — Z794 Long term (current) use of insulin: Secondary | ICD-10-CM

## 2020-10-31 DIAGNOSIS — E1142 Type 2 diabetes mellitus with diabetic polyneuropathy: Secondary | ICD-10-CM

## 2020-10-31 NOTE — Telephone Encounter (Signed)
   Notes to clinic .Is this pt associated with your practice, no PCP listed, however, Dr. Wynetta Emery did write last rx.  Has already had a curtesy refill and there is no upcoming appointment scheduled.

## 2020-12-04 ENCOUNTER — Other Ambulatory Visit: Payer: Self-pay | Admitting: Internal Medicine

## 2020-12-04 NOTE — Telephone Encounter (Signed)
Requested medications are due for refill today.  yes  Requested medications are on the active medications list.  yes  Last refill. 10/14/2020  Future visit scheduled.   no  Notes to clinic.  More than 3 months overdue.

## 2020-12-31 ENCOUNTER — Ambulatory Visit: Payer: Self-pay

## 2020-12-31 ENCOUNTER — Other Ambulatory Visit: Payer: Self-pay

## 2020-12-31 ENCOUNTER — Encounter: Payer: Self-pay | Admitting: Family Medicine

## 2020-12-31 ENCOUNTER — Ambulatory Visit (INDEPENDENT_AMBULATORY_CARE_PROVIDER_SITE_OTHER): Payer: 59 | Admitting: Family Medicine

## 2020-12-31 VITALS — BP 110/74 | Ht 66.0 in | Wt 340.0 lb

## 2020-12-31 DIAGNOSIS — M170 Bilateral primary osteoarthritis of knee: Secondary | ICD-10-CM | POA: Diagnosis not present

## 2020-12-31 MED ORDER — TRIAMCINOLONE ACETONIDE 0.1 % EX OINT: 1.0000 "application " | TOPICAL_OINTMENT | Freq: Two times a day (BID) | CUTANEOUS | 1 refills | Status: DC

## 2020-12-31 MED ORDER — TRIAMCINOLONE ACETONIDE 40 MG/ML IJ SUSP
40.0000 mg | Freq: Once | INTRAMUSCULAR | Status: AC
  Administered 2020-12-31: 40 mg via INTRA_ARTICULAR

## 2020-12-31 MED ORDER — TRIAMCINOLONE ACETONIDE 40 MG/ML IJ SUSP: 80.0000 mg | Freq: Once | INTRAMUSCULAR | Status: DC

## 2020-12-31 NOTE — Progress Notes (Signed)
Anita James - 70 y.o. female MRN [id number]  Date of birth: 1951/07/18  SUBJECTIVE:  Including CC & ROS.  No chief complaint on file.   Anita James is a 70 y.o. female that is presenting with acute on chronic bilateral knee pain.  She did get improvement with previous steroid injections.  Denies any injury or recent fall.   Review of Systems See HPI   HISTORY: Past Medical, Surgical, Social, and Family History Reviewed & Updated per EMR.   Pertinent Historical Findings include:  Past Medical History:  Diagnosis Date  . Anxiety   . Carpal tunnel syndrome 06/14/2014  . Chest pain   . CHF (congestive heart failure) ([place])   . Chronic back pain   . Chronic kidney disease 03/2013   failure due to meds  . Diabetes mellitus type II, uncontrolled ([place])   . Diverticulitis   . Frequent headaches   . Hiatal hernia   . Hyperlipemia   . Hypertension   . Hypothyroidism   . 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 Neuropathy   . Obesity   . Pneumonia   . Vitamin D deficiency 06/05/2014    Past Surgical History:  Procedure Laterality Date  . CESAREAN SECTION    . KNEE SURGERY Left   . THYROID SURGERY     goiter    Family History  Problem Relation Age of Onset  . Cancer Mother        cervical- mets  . Breast cancer Mother   . Cancer Father   . Heart disease Father   . Diabetes Father   . Diabetes Sister   . Breast cancer Sister   . Heart disease Sister   . Breast cancer Sister   . Heart disease Sister   . Breast cancer Sister   . Breast cancer Sister   . COPD Sister   . Breast cancer Sister   . Colon cancer Neg Hx   . Esophageal cancer Neg Hx   . Rectal cancer Neg Hx   . Stomach cancer Neg Hx     Social History   Socioeconomic History  . Marital status: Divorced    Spouse name: Not on file  . Number of children: 6  . Years of education: BA  . Highest education level: Not on file  Occupational History  . Occupation: unemployed  Tobacco Use  . Smoking status: Never  Smoker  . Smokeless tobacco: Never Used  Vaping Use  . Vaping Use: Never used  Substance and Sexual Activity  . Alcohol use: No  . Drug use: No  . Sexual activity: Not on file  Other Topics Concern  . Not on file  Social History Narrative  . Not on file   Social Determinants of Health   Financial Resource Strain: Not on file  Food Insecurity: Not on file  Transportation Needs: Not on file  Physical Activity: Not on file  Stress: Not on file  Social Connections: Not on file  Intimate Partner Violence: Not on file     PHYSICAL EXAM:  VS: BP 110/74 (BP Location: Left Arm, Patient Position: Sitting, Cuff Size: Large)   Ht 5\' 6"  (1.676 m)   Wt (!) 340 lb (154.2 kg)   BMI 54.88 kg/m  Physical Exam Gen: NAD, alert, cooperative with exam, well-appearing MSK:  Right and left knee: No obvious effusion. Normal strength resistance. Tenderness palpation of the joint space. Neurovascular intact   Aspiration/Injection Procedure Note Lizzett Nobile [birth year]  Procedure: Injection Indications:  Right knee pain  Procedure Details Consent: Risks of procedure as well as the alternatives and risks of each were explained to the (patient/caregiver).  Consent for procedure obtained. Time Out: Verified patient identification, verified procedure, site/side was marked, verified correct patient position, special equipment/implants available, medications/allergies/relevent history reviewed, required imaging and test results available.  Performed.  The area was cleaned with iodine and alcohol swabs.    The Right knee superior lateral suprapatellar pouch was injected using 3 cc 1% lidocaine on a 21-gauge 2 inch needle.  The syringe was switched to mixture containing 1 cc's of 40 mg Kenalog and 4 cc's of 0.25% bupivacaine was injected.  Ultrasound was used. Images were obtained in long views showing the injection.     A sterile dressing was applied.  Patient did tolerate procedure  well.   Aspiration/Injection Procedure Note Shelsea Hangartner July 01, 1951  Procedure: Injection Indications: Left knee pain  Procedure Details Consent: Risks of procedure as well as the alternatives and risks of each were explained to the (patient/caregiver).  Consent for procedure obtained. Time Out: Verified patient identification, verified procedure, site/side was marked, verified correct patient position, special equipment/implants available, medications/allergies/relevent history reviewed, required imaging and test results available.  Performed.  The area was cleaned with iodine and alcohol swabs.    The Left knee superior lateral suprapatellar pouch was injected using 3 cc 1% lidocaine on a 21-gauge 2 inch needle.  The syringe was switched to mixture containing 1 cc's of 40 mg Kenalog and 4 cc's of 0.25% bupivacaine was injected.  Ultrasound was used. Images were obtained in long views showing the injection.     A sterile dressing was applied.  Patient did tolerate procedure well.   ASSESSMENT & PLAN:   OA (osteoarthritis) of knee Acute on chronic in nature.  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 -Counseled on home exercise therapy and supportive care. -Bilateral injections today. -Could consider physical therapy.

## 2020-12-31 NOTE — Patient Instructions (Signed)
Good to see you Please try ice as needed  We could try gel injections going forward Please send me a message in MyChart with any questions or updates.  Please see me back in 4 weeks.    --Dr. Raeford Razor

## 2021-01-01 NOTE — Assessment & Plan Note (Signed)
Acute on chronic in nature.  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 -Counseled on home exercise therapy and supportive care. -Bilateral injections today. -Could consider physical therapy.

## 2021-01-02 ENCOUNTER — Other Ambulatory Visit: Payer: Self-pay | Admitting: *Deleted

## 2021-01-02 MED ORDER — LIDOCAINE 5 % EX PTCH: 1.0000 | MEDICATED_PATCH | Freq: Two times a day (BID) | CUTANEOUS | 2 refills | Status: DC

## 2021-01-28 ENCOUNTER — Ambulatory Visit: Payer: 59 | Admitting: Family Medicine

## 2021-01-28 NOTE — Progress Notes (Deleted)
  Anita James - 70 y.o. female MRN [id number]  Date of birth: Feb 07, 1951  SUBJECTIVE:  Including CC & ROS.  No chief complaint on file.   Anita James is a 70 y.o. female that is  ***.  ***   Review of Systems See HPI   HISTORY: Past Medical, Surgical, Social, and Family History Reviewed & Updated per EMR.   Pertinent Historical Findings include:  Past Medical History:  Diagnosis Date  . Anxiety   . Carpal tunnel syndrome 06/14/2014  . Chest pain   . CHF (congestive heart failure) ([place])   . Chronic back pain   . Chronic kidney disease 03/2013   failure due to meds  . Diabetes mellitus type II, uncontrolled ([place])   . Diverticulitis   . Frequent headaches   . Hiatal hernia   . Hyperlipemia   . Hypertension   . Hypothyroidism   . 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 Neuropathy   . Obesity   . Pneumonia   . Vitamin D deficiency 06/05/2014    Past Surgical History:  Procedure Laterality Date  . CESAREAN SECTION    . KNEE SURGERY Left   . THYROID SURGERY     goiter    Family History  Problem Relation Age of Onset  . Cancer Mother        cervical- mets  . Breast cancer Mother   . Cancer Father   . Heart disease Father   . Diabetes Father   . Diabetes Sister   . Breast cancer Sister   . Heart disease Sister   . Breast cancer Sister   . Heart disease Sister   . Breast cancer Sister   . Breast cancer Sister   . COPD Sister   . Breast cancer Sister   . Colon cancer Neg Hx   . Esophageal cancer Neg Hx   . Rectal cancer Neg Hx   . Stomach cancer Neg Hx     Social History   Socioeconomic History  . Marital status: Divorced    Spouse name: Not on file  . Number of children: 6  . Years of education: BA  . Highest education level: Not on file  Occupational History  . Occupation: unemployed  Tobacco Use  . Smoking status: Never Smoker  . Smokeless tobacco: Never Used  Vaping Use  . Vaping Use: Never used  Substance and Sexual Activity  . Alcohol use: No   . Drug use: No  . Sexual activity: Not on file  Other Topics Concern  . Not on file  Social History Narrative  . Not on file   Social Determinants of Health   Financial Resource Strain: Not on file  Food Insecurity: Not on file  Transportation Needs: Not on file  Physical Activity: Not on file  Stress: Not on file  Social Connections: Not on file  Intimate Partner Violence: Not on file     PHYSICAL EXAM:  VS: There were no vitals taken for this visit. Physical Exam Gen: NAD, alert, cooperative with exam, well-appearing MSK:  ***      ASSESSMENT & PLAN:   No problem-specific Assessment & Plan notes found for this encounter.

## 2021-03-04 ENCOUNTER — Encounter (HOSPITAL_COMMUNITY): Payer: Self-pay

## 2021-03-04 ENCOUNTER — Emergency Department (HOSPITAL_COMMUNITY): Payer: 59

## 2021-03-04 ENCOUNTER — Other Ambulatory Visit: Payer: Self-pay

## 2021-03-04 ENCOUNTER — Emergency Department (HOSPITAL_COMMUNITY)
Admission: EM | Admit: 2021-03-04 | Discharge: 2021-03-04 | Disposition: A | Discharge status: Home / Self Care | Payer: 59 | Attending: Emergency Medicine | Admitting: Emergency Medicine

## 2021-03-04 DIAGNOSIS — Z79899 Other long term (current) drug therapy: Secondary | ICD-10-CM | POA: Diagnosis not present

## 2021-03-04 DIAGNOSIS — R1030 Lower abdominal pain, unspecified: Secondary | ICD-10-CM | POA: Insufficient documentation

## 2021-03-04 DIAGNOSIS — K5792 Diverticulitis of intestine, part unspecified, without perforation or abscess without bleeding: Secondary | ICD-10-CM

## 2021-03-04 DIAGNOSIS — K625 Hemorrhage of anus and rectum: Secondary | ICD-10-CM | POA: Insufficient documentation

## 2021-03-04 DIAGNOSIS — E1122 Type 2 diabetes mellitus with diabetic chronic kidney disease: Secondary | ICD-10-CM | POA: Diagnosis not present

## 2021-03-04 DIAGNOSIS — N189 Chronic kidney disease, unspecified: Secondary | ICD-10-CM | POA: Diagnosis not present

## 2021-03-04 DIAGNOSIS — E039 Hypothyroidism, unspecified: Secondary | ICD-10-CM | POA: Insufficient documentation

## 2021-03-04 DIAGNOSIS — Z794 Long term (current) use of insulin: Secondary | ICD-10-CM | POA: Diagnosis not present

## 2021-03-04 DIAGNOSIS — E1140 Type 2 diabetes mellitus with diabetic neuropathy, unspecified: Secondary | ICD-10-CM | POA: Diagnosis not present

## 2021-03-04 DIAGNOSIS — I5030 Unspecified diastolic (congestive) heart failure: Secondary | ICD-10-CM | POA: Diagnosis not present

## 2021-03-04 DIAGNOSIS — I130 Hypertensive heart and chronic kidney disease with heart failure and stage 1 through stage 4 chronic kidney disease, or unspecified chronic kidney disease: Secondary | ICD-10-CM | POA: Diagnosis not present

## 2021-03-04 DIAGNOSIS — Z7982 Long term (current) use of aspirin: Secondary | ICD-10-CM | POA: Diagnosis not present

## 2021-03-04 LAB — COMPREHENSIVE METABOLIC PANEL
ALT: 12 U/L (ref 0–44)
AST: 23 U/L (ref 15–41)
Albumin: 3.8 g/dL (ref 3.5–5.0)
Alkaline Phosphatase: 55 U/L (ref 38–126)
Anion gap: 7 (ref 5–15)
BUN: 21 mg/dL (ref 8–23)
CO2: 27 mmol/L (ref 22–32)
Calcium: 8.7 mg/dL — ABNORMAL LOW (ref 8.9–10.3)
Chloride: 106 mmol/L (ref 98–111)
Creatinine, Ser: 1.31 mg/dL — ABNORMAL HIGH (ref 0.44–1.00)
GFR, Estimated: 45 mL/min — ABNORMAL LOW (ref >60)
Glucose, Bld: 221 mg/dL — ABNORMAL HIGH (ref 70–99)
Potassium: 5.1 mmol/L (ref 3.5–5.1)
Sodium: 140 mmol/L (ref 135–145)
Total Bilirubin: 0.5 mg/dL (ref 0.3–1.2)
Total Protein: 7.4 g/dL (ref 6.5–8.1)

## 2021-03-04 LAB — CBC WITH DIFFERENTIAL/PLATELET
Abs Immature Granulocytes: 0.02 10*3/uL (ref 0.00–0.07)
Basophils Absolute: 0 10*3/uL (ref 0.0–0.1)
Basophils Relative: 0 %
Eosinophils Absolute: 0.1 10*3/uL (ref 0.0–0.5)
Eosinophils Relative: 1 %
HCT: 36.5 % (ref 36.0–46.0)
Hemoglobin: 11.7 g/dL — ABNORMAL LOW (ref 12.0–15.0)
Immature Granulocytes: 0 %
Lymphocytes Relative: 26 %
Lymphs Abs: 1.5 10*3/uL (ref 0.7–4.0)
MCH: 30.6 pg (ref 26.0–34.0)
MCHC: 32.1 g/dL (ref 30.0–36.0)
MCV: 95.5 fL (ref 80.0–100.0)
Monocytes Absolute: 0.7 10*3/uL (ref 0.1–1.0)
Monocytes Relative: 12 %
Neutro Abs: 3.4 10*3/uL (ref 1.7–7.7)
Neutrophils Relative %: 61 %
Platelets: 209 10*3/uL (ref 150–400)
RBC: 3.82 MIL/uL — ABNORMAL LOW (ref 3.87–5.11)
RDW: 13.4 % (ref 11.5–15.5)
WBC: 5.6 10*3/uL (ref 4.0–10.5)
nRBC: 0 % (ref 0.0–0.2)

## 2021-03-04 LAB — TYPE AND SCREEN
ABO/RH(D): A NEG
Antibody Screen: NEGATIVE

## 2021-03-04 LAB — ABO/RH: ABO/RH(D): A NEG

## 2021-03-04 LAB — LIPASE, BLOOD: Lipase: 34 U/L (ref 11–51)

## 2021-03-04 MED ORDER — LACTATED RINGERS IV SOLN: INTRAVENOUS | Status: DC

## 2021-03-04 MED ORDER — OXYCODONE-ACETAMINOPHEN 5-325 MG PO TABS: 1.0000 | ORAL_TABLET | Freq: Four times a day (QID) | ORAL | 0 refills | Status: DC | PRN

## 2021-03-04 MED ORDER — METRONIDAZOLE 500 MG PO TABS: 500.0000 mg | ORAL_TABLET | Freq: Two times a day (BID) | ORAL | 0 refills | Status: DC

## 2021-03-04 MED ORDER — CIPROFLOXACIN HCL 500 MG PO TABS: 500.0000 mg | ORAL_TABLET | Freq: Two times a day (BID) | ORAL | 0 refills | Status: DC

## 2021-03-04 MED ORDER — IOHEXOL 300 MG/ML  SOLN
80.0000 mL | Freq: Once | INTRAMUSCULAR | Status: AC | PRN
  Administered 2021-03-04: 80 mL via INTRAVENOUS

## 2021-03-04 NOTE — ED Notes (Signed)
Patient said she is not able to give a urine sample until she has something to drink.

## 2021-03-04 NOTE — ED Provider Notes (Signed)
Emergency Medicine Provider Triage Evaluation Note  Anita James , a 70 y.o. female  was evaluated in triage.  Pt complains of lower abdominal pain x 3 days with bright red blood in the toilet. No history of diverticulitis.   Review of Systems  Positive: Abdominal pain, BRBPR Negative: Fever, vomiting  Physical Exam  BP (!) 151/86 (BP Location: Right Arm)   Pulse 84   Temp 97.9 F (36.6 C) (Oral)   Resp 17   SpO2 100%  Gen:   Awake, no distress   Resp:  Normal effort  MSK:   Moves extremities without difficulty  Other:  Obese, habitus limits exam, no tenderness on seated triage exam  Medical Decision Making  Medically screening exam initiated at 4:30 PM.  Appropriate orders placed.  Anita James was informed that the remainder of the evaluation will be completed by another provider, this initial triage assessment does not replace that evaluation, and the importance of remaining in the ED until their evaluation is complete.     Tacy Learn, PA-C 03/04/21 1631    Varney Biles, MD 03/05/21 [phone]

## 2021-03-04 NOTE — ED Triage Notes (Signed)
Patient reports that she began having lower abdominal cramping 3 days ago and states she has noted bright red blood in the toilet. Patient also c/o bilateral lower foot swelling x 2 weeks.  Patient reports that she was treated for a UTI 2 weeks ago. Patient states she has been taking Pyridium so is not sure if she has been having dysuria or frequency.

## 2021-03-04 NOTE — ED Provider Notes (Signed)
[facility] DEPT Provider Note   CSN: [id number] Arrival date & time: 03/04/21  1531     History Chief Complaint  Patient presents with   Abdominal Pain   Blood In Stools   Back Pain    Anita James is a 70 y.o. female.  70 year old female presents with bright red blood per rectum x1 day.  Patient states that she has had lower abdominal discomfort.  Concerned she may have a UTI.  Denies any history of colonic bleeding.  She does not take blood thinners.  Denies any fever or emesis.  States that he went to move her bowels and noted some bright red blood in the commode.  No treatment use prior to arrival      Past Medical History:  Diagnosis Date   Anxiety    Carpal tunnel syndrome 06/14/2014   Chest pain    CHF (congestive heart failure) ([place])    Chronic back pain    Chronic kidney disease 03/2013   failure due to meds   Diabetes mellitus type II, uncontrolled ([place])    Diverticulitis    Frequent headaches    Hiatal hernia    Hyperlipemia    Hypertension    Hypothyroidism    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   Neuropathy    Obesity    Pneumonia    Vitamin D deficiency 06/05/2014    Patient Active Problem List   Diagnosis Date Noted   Osteopenia of neck of left femur 05/09/2020   Chondrocalcinosis 04/29/2020   OA (osteoarthritis) of knee 04/15/2020   Right hand pain 04/15/2020   OSA on CPAP 05/19/2017   Morbid obesity due to excess calories ([place]) 03/17/2017   Neuropathic pain 11/04/2016   Carpal tunnel syndrome 06/14/2014   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   Cervical radiculopathy at C8 06/05/2014   Hyperlipemia 70/96/2836   Diastolic CHF ([place]) 62/94/7654   Major depression 09/12/2013   Allergic rhinitis 06/22/2013   Chronic pain syndrome 05/31/2013   Gastroesophageal reflux disease without esophagitis 08/30/2012   Essential hypertension    Hypothyroidism    Type 2 diabetes, uncontrolled, with neuropathy ([place])     Past  Surgical History:  Procedure Laterality Date   CESAREAN SECTION     KNEE SURGERY Left    THYROID SURGERY     goiter     OB History     Gravida  4   Para  3   Term  2   Preterm  1   AB  1   Living  6      SAB      IAB      Ectopic  1   Multiple  2   Live Births  5           Family History  Problem Relation Age of Onset   Cancer Mother        cervical- mets   Breast cancer Mother    Cancer Father    Heart disease Father    Diabetes Father    Diabetes Sister    Breast cancer Sister    Heart disease Sister    Breast cancer Sister    Heart disease Sister    Breast cancer Sister    Breast cancer Sister    COPD Sister    Breast cancer Sister    Colon cancer Neg Hx    Esophageal cancer Neg Hx    Rectal cancer Neg Hx    Stomach  cancer Neg Hx     Social History   Tobacco Use   Smoking status: Never   Smokeless tobacco: Never  Vaping Use   Vaping Use: Never used  Substance Use Topics   Alcohol use: No   Drug use: No    Home Medications Prior to Admission medications   Medication Sig Start Date End Date Taking? Authorizing Provider  Accu-Chek FastClix Lancets MISC USE AS DIRECTED TO CHECK BLOOD SUGAR THREE TIMES DAILY 11/07/19   Ladell Pier, MD  ACCU-CHEK GUIDE test strip USE AS DIRECTED TO CHECK BLOOD SUGAR THREE TIMES DAILY 10/04/19   Ladell Pier, MD  acetaminophen (TYLENOL) 500 MG tablet Take 1 tablet (500 mg total) by mouth every 6 (six) hours as needed. 09/16/18   Darlin Drop P, PA-C  aspirin EC 81 MG tablet Take 81 mg by mouth daily. 11/20/16   [provider]  atorvastatin (LIPITOR) 40 MG tablet TAKE 1 TABLET BY MOUTH DAILY AT 6 PM 11/27/19   Ladell Pier, MD  Blood Glucose Monitoring Suppl (ACCU-CHEK AVIVA PLUS) w/Device KIT USE AS DIRECTED TO TEST TWICE DAILY 04/27/18   Ladell Pier, MD  carvedilol (COREG) 12.5 MG tablet Take 1.5 tablets (18.75 mg total) by mouth 2 (two) times daily with a meal. 10/13/19    Ladell Pier, MD  Cetirizine HCl 10 MG CAPS Take 1 capsule (10 mg total) by mouth daily. 10/21/18   Ladell Pier, MD  ciprofloxacin (CIPRO) 500 MG tablet Take 1 tablet (500 mg total) by mouth 2 (two) times daily. 11/14/19   Fredia Sorrow, MD  DEXILANT 60 MG capsule TAKE ONE CAPSULE BY MOUTH DAILY 01/15/20   Ladell Pier, MD  diclofenac sodium (VOLTAREN) 1 % GEL Apply 4 g topically 4 (four) times daily. 02/27/19   Deno Etienne, DO  Ergocalciferol (VITAMIN D2) 10 MCG (400 UNIT) TABS Take 400 Units by mouth daily. 10/21/18   Ladell Pier, MD  ezetimibe (ZETIA) 10 MG tablet Take 10 mg by mouth daily. 02/05/20   [provider]  FARXIGA 10 MG TABS tablet Take 10 mg by mouth daily. 03/16/20   [provider]  fluticasone (FLONASE) 50 MCG/ACT nasal spray PLACE 2 SPRAYS IN EACH NOSTRIL DAILY AS NEEDED FOR ALLERGIES 07/21/19   Ladell Pier, MD  gabapentin (NEURONTIN) 300 MG capsule TAKE 2 CAPSULES BY MOUTH TWICE DAILY 08/01/20   Ladell Pier, MD  insulin aspart (NOVOLOG FLEXPEN) 100 UNIT/ML FlexPen Inject 60 Units into the skin 3 (three) times daily with meals. 12/05/19   Ladell Pier, MD  Insulin Glargine (LANTUS SOLOSTAR) 100 UNIT/ML Solostar Pen Inject 84 Units into the skin daily. 10/13/19   Ladell Pier, MD  Insulin Pen Needle (TRUEPLUS PEN NEEDLES) 32G X 4 MM MISC Use to inject insulin. 10/13/18   Ladell Pier, MD  levothyroxine (SYNTHROID) 100 MCG tablet Take 1 tablet (100 mcg total) by mouth daily. 10/13/19   Ladell Pier, MD  lidocaine (LIDODERM) 5 % Place 1 patch onto the skin every 12 (twelve) hours. Remove & Discard patch within 12 hours or as directed by MD 01/02/21   Rosemarie Ax, MD  liraglutide (VICTOZA) 18 MG/3ML SOPN Inject 1.8 mg into the skin daily. 10/13/19   Ladell Pier, MD  lisinopril (ZESTRIL) 40 MG tablet TAKE 1 TABLET BY MOUTH DAILY 11/10/19   Ladell Pier, MD  mometasone (NASONEX) 50 MCG/ACT nasal  spray Place 2 sprays into the nose  daily. Patient taking differently: Place 2 sprays into the nose daily as needed (allergies).  08/24/17   Ladell Pier, MD  Multiple Vitamins-Minerals (EYE VITAMINS) CAPS Take 1 capsule by mouth daily. 08/11/12   Thurnell Lose, MD  NOVOFINE 32G X 6 MM MISC SMARTSIG:1 SUB-Q 4-5 Times Daily 01/22/20   [provider]  NYSTATIN powder APPLY TOPICALLY 4 TIMES DAILY. Patient taking differently: Apply 1 Bottle topically 4 (four) times daily as needed (rash).  01/18/17   Langeland, Leda Quail, MD  ondansetron (ZOFRAN) 4 MG tablet Take 1 tablet (4 mg total) by mouth every 6 (six) hours. 09/16/18   Darlin Drop P, PA-C  phenazopyridine (PYRIDIUM) 200 MG tablet Take 1 tablet (200 mg total) by mouth 3 (three) times daily. 07/17/19   Gareth Morgan, MD  predniSONE (DELTASONE) 5 MG tablet Take 6 pills for first day, 5 pills second day, 4 pills third day, 3 pills fourth day, 2 pills the fifth day, and 1 pill sixth day. 04/15/20   Rosemarie Ax, MD  spironolactone (ALDACTONE) 25 MG tablet TAKE 1 TABLET BY MOUTH EVERY MORNING AND 1/2 TABLET BY MOUTH EVERY EVENING 10/13/19   Ladell Pier, MD  torsemide (DEMADEX) 20 MG tablet Comments:   Filled Date: Apr 29 2019  1:31PM  Patient Notes: TAKE 1 TABLET BY MOUTH EVERY MORNING AS NEEDED. FLUID PILL  Duration: 90 05/09/18   [provider]  triamcinolone ointment (KENALOG) 0.1 % Apply 1 application topically 2 (two) times daily. To affected areas 12/31/20   Rosemarie Ax, MD  valACYclovir (VALTREX) 1000 MG tablet Take 1 tablet (1,000 mg total) by mouth 2 (two) times daily. 05/09/19   Charlott Rakes, MD    Allergies    Amoxicillin-pot clavulanate, Hydrocodone-acetaminophen, Oxycodone, Sulfa antibiotics, Norvasc [amlodipine besylate], Sulfamethoxazole, and Tramadol  Review of Systems   Review of Systems  All other systems reviewed and are negative.  Physical Exam Updated Vital Signs BP (!) 151/86  (BP Location: Right Arm)   Pulse 84   Temp 97.9 F (36.6 C) (Oral)   Resp 17   Ht 1.676 m ($Remove'5\' 6"'NdColKz$ )   Wt (!) 149.7 kg   SpO2 100%   BMI 53.26 kg/m   Physical Exam Vitals and nursing note reviewed.  Constitutional:      General: She is not in acute distress.    Appearance: Normal appearance. She is well-developed. She is not toxic-appearing.  HENT:     Head: Normocephalic and atraumatic.  Eyes:     General: Lids are normal.     Conjunctiva/sclera: Conjunctivae normal.     Pupils: Pupils are equal, round, and reactive to light.  Neck:     Thyroid: No thyroid mass.     Trachea: No tracheal deviation.  Cardiovascular:     Rate and Rhythm: Normal rate and regular rhythm.     Heart sounds: Normal heart sounds. No murmur heard.   No gallop.  Pulmonary:     Effort: Pulmonary effort is normal. No respiratory distress.     Breath sounds: Normal breath sounds. No stridor. No decreased breath sounds, wheezing, rhonchi or rales.  Abdominal:     General: There is no distension.     Palpations: Abdomen is soft.     Tenderness: There is no abdominal tenderness. There is no rebound.  Musculoskeletal:        General: No tenderness. Normal range of motion.     Cervical back: Normal range of motion and neck  supple.  Skin:    General: Skin is warm and dry.     Findings: No abrasion or rash.  Neurological:     Mental Status: She is alert and oriented to person, place, and time. Mental status is at baseline.     GCS: GCS eye subscore is 4. GCS verbal subscore is 5. GCS motor subscore is 6.     Cranial Nerves: Cranial nerves are intact. No cranial nerve deficit.     Sensory: No sensory deficit.     Motor: Motor function is intact.  Psychiatric:        Attention and Perception: Attention normal.        Speech: Speech normal.        Behavior: Behavior normal.    ED Results / Procedures / Treatments   Labs (all labs ordered are listed, but only abnormal results are displayed) Labs  Reviewed  CBC WITH DIFFERENTIAL/PLATELET - Abnormal; Notable for the following components:      Result Value   RBC 3.82 (*)    Hemoglobin 11.7 (*)    All other components within normal limits  COMPREHENSIVE METABOLIC PANEL  LIPASE, BLOOD  URINALYSIS, ROUTINE W REFLEX MICROSCOPIC    EKG None  Radiology No results found.  Procedures Procedures   Medications Ordered in ED Medications  lactated ringers infusion (has no administration in time range)    ED Course  I have reviewed the triage vital signs and the nursing notes.  Pertinent labs & imaging results that were available during my care of the patient were reviewed by me and considered in my medical decision making (see chart for details).    MDM Rules/Calculators/A&P                          CT scan consistent with diverticulitis.  Hemoglobin stable here.  Will place on oral antibiotics and give referral to GI Final Clinical Impression(s) / ED Diagnoses Final diagnoses:  None    Rx / DC Orders ED Discharge Orders     None        Lacretia Leigh, MD 03/04/21 2050

## 2021-03-25 ENCOUNTER — Other Ambulatory Visit: Payer: Self-pay | Admitting: Gastroenterology

## 2021-03-29 IMAGING — MR MR KNEE*R* W/O CM
2 series · 40 of 40 positions shown · non-contrast
Comparison: 04/09/2020 radiographs

CLINICAL DATA: Right knee pain after injury. Prior arthroscopic
knee surgeries.

EXAM:
MRI OF THE RIGHT KNEE WITHOUT CONTRAST
TECHNIQUE: Multiplanar, multisequence MR imaging of the knee was performed. No
intravenous contrast was administered.

[Series 11: T2 fat-sat · coronal · 4.0mm · 0.62mm/px · 22 of 37 slices shown]
[im 1/37]
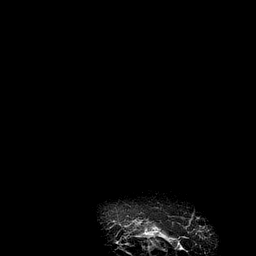
[im 2/37]
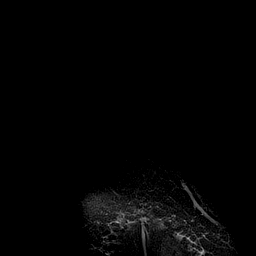
[im 4/37]
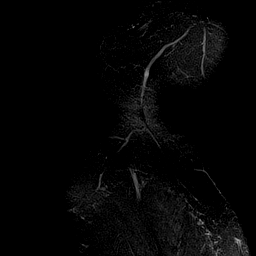
[im 6/37]
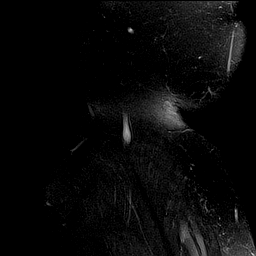
[im 7/37]
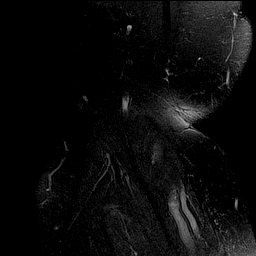
[im 9/37]
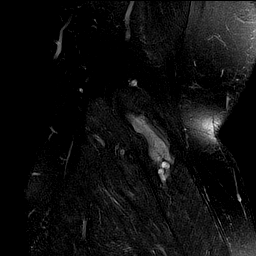
[im 11/37]
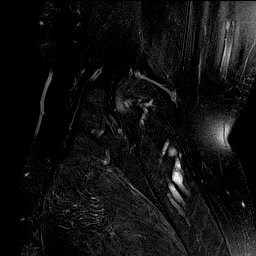
[im 13/37]
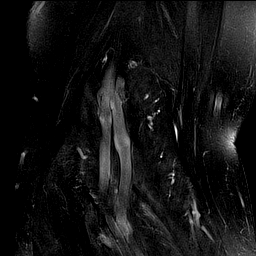
[im 14/37]
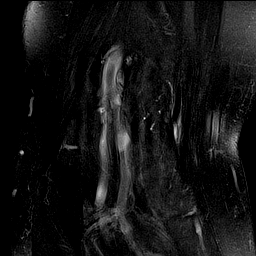
[im 16/37]
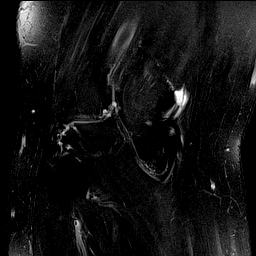
[im 18/37]
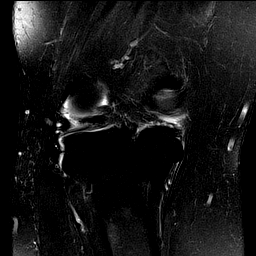
[im 19/37]
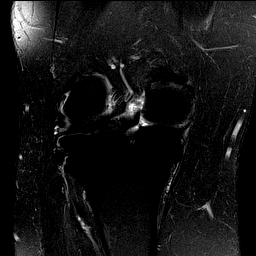
[im 21/37]
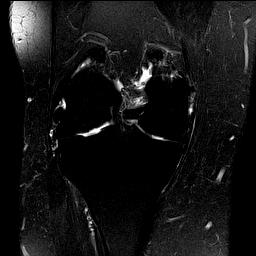
[im 23/37]
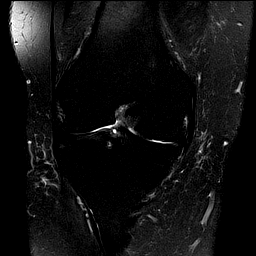
[im 25/37]
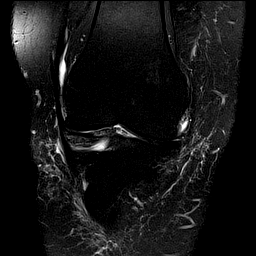
[im 26/37]
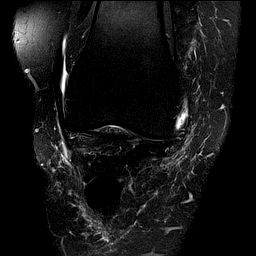
[im 28/37]
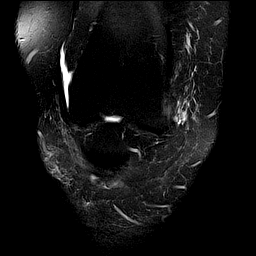
[im 30/37]
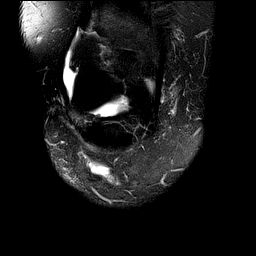
[im 31/37]
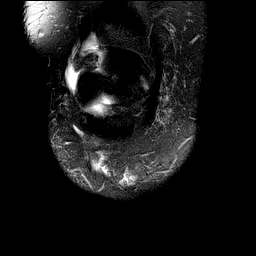
[im 33/37]
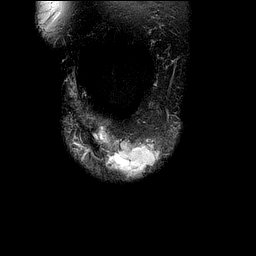
[im 35/37]
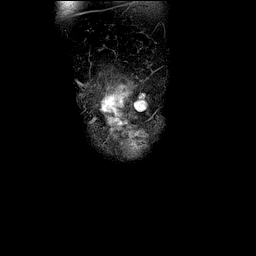
[im 37/37]
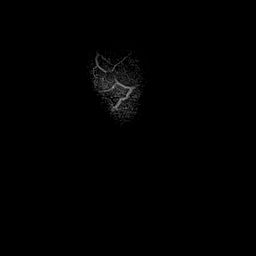

[Series 13: PD fat-sat · sagittal · 3.0mm · 0.66mm/px · 18 of 30 slices shown]
[im 1/30]
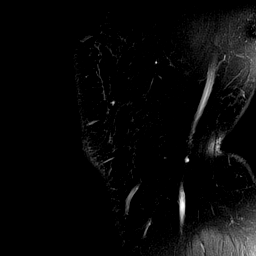
[im 2/30]
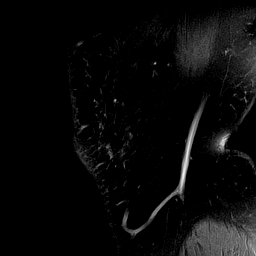
[im 4/30]
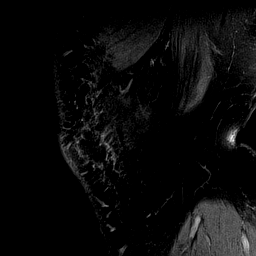
[im 6/30]
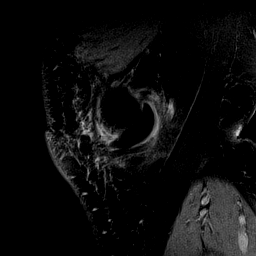
[im 7/30]
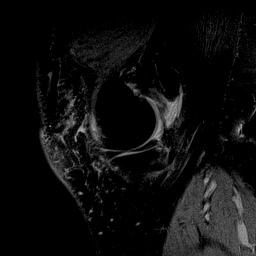
[im 9/30]
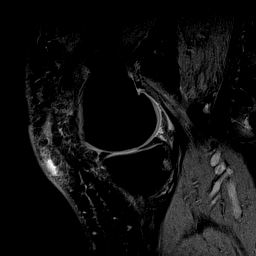
[im 11/30]
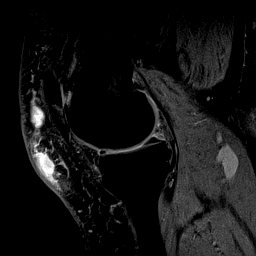
[im 12/30]
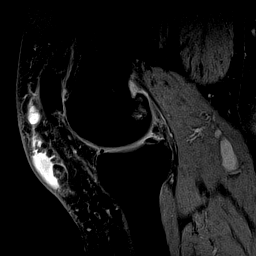
[im 14/30]
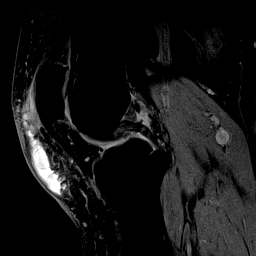
[im 16/30]
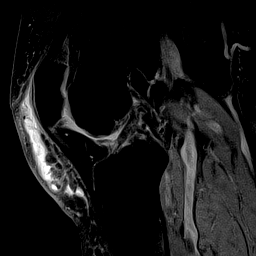
[im 18/30]
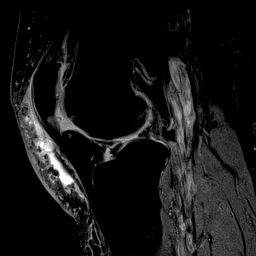
[im 19/30]
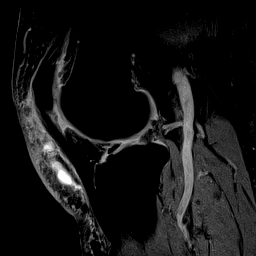
[im 21/30]
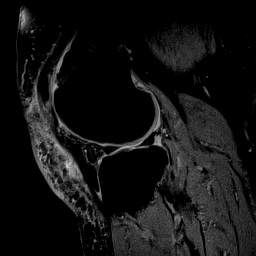
[im 23/30]
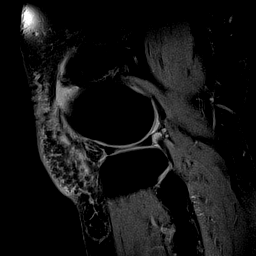
[im 24/30]
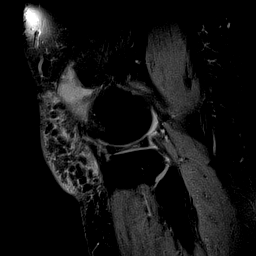
[im 26/30]
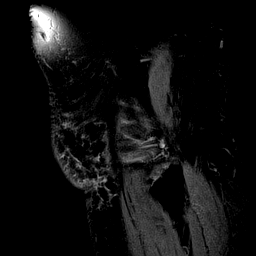
[im 28/30]
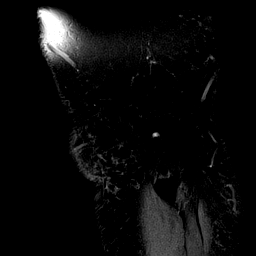
[im 30/30]
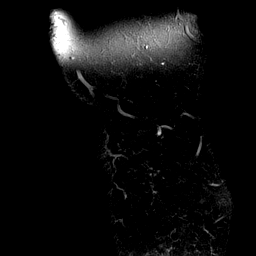

[40 of 40 positions shown; findings below may reference images not displayed]

FINDINGS: MENISCI

Medial meniscus: Faintly accentuated internal proton density
weighted signal in the medial meniscus likely reflects the known
chondrocalcinosis. No appreciable tear.

Lateral meniscus: The accentuated somewhat irregular signal in the
midbody of the lateral meniscus is primarily attributed to
chondrocalcinosis, without a definite superimposed tear.

LIGAMENTS

Cruciates:  Unremarkable

Collaterals:  Unremarkable

CARTILAGE

Patellofemoral: Prominent chondral thinning inferiorly along the
medial patellar facet and along portions of the lateral femoral
trochlear groove especially inferiorly. Otherwise moderate
degenerative chondral thinning. Marginal spurring.

Medial: Moderate degenerative chondral thinning with mild chondral
irregularity especially posteriorly along the medial femoral condyle
for example on image [DATE] where there a partial thickness 0.7 cm
chondral defect. Moderate marginal spurring.

Lateral: Generally moderate degenerative chondral thinning and
associated chondral irregularity with focal chondral defects for
example on image [DATE]. Marginal spurring.

Joint:  Trace knee effusion.

Popliteal Fossa:  Small Baker's cyst.

Extensor Mechanism: Suspected tear of the lateral patellar
retinaculum inferiorly for example on images 11 through 13 of series
9.

Bones: No significant extra-articular osseous abnormalities
identified.

Other: Prepatellar bursitis with mildly complex local fluid
collection measuring 7.1 by 3.1 by 1.3 cm (volume = 15 cm^3).
IMPRESSION: 1. Suspected tear of the lateral patellar retinaculum inferiorly.
2. Prepatellar bursitis with mildly complex local fluid collection
measuring 15 cubic cm.
3. Generally moderate degenerative chondral thinning with focal
chondral defects along the medial femoral condyle and lateral
femoral trochlear groove. Tricompartmental spurring.
4. Trace knee effusion with small Baker's cyst.
5. Intrameniscal signal attributed to chondrocalcinosis.

## 2021-03-31 ENCOUNTER — Ambulatory Visit: Payer: Self-pay

## 2021-03-31 ENCOUNTER — Other Ambulatory Visit: Payer: Self-pay

## 2021-03-31 ENCOUNTER — Ambulatory Visit (INDEPENDENT_AMBULATORY_CARE_PROVIDER_SITE_OTHER): Payer: 59 | Admitting: Family Medicine

## 2021-03-31 VITALS — BP 120/60 | Ht 66.0 in | Wt 327.0 lb

## 2021-03-31 DIAGNOSIS — M170 Bilateral primary osteoarthritis of knee: Secondary | ICD-10-CM

## 2021-03-31 MED ORDER — TRIAMCINOLONE ACETONIDE 40 MG/ML IJ SUSP
40.0000 mg | Freq: Once | INTRAMUSCULAR | Status: AC
  Administered 2021-03-31: 40 mg via INTRA_ARTICULAR

## 2021-03-31 NOTE — Patient Instructions (Signed)
Good to see you Please try ice  Please try physical therapy  Please be seen by healthy weight and wellness.  We can do a genicular nerve block next time if your pain returns before three months   Please send me a message in MyChart with any questions or updates.  Please see me back in 3 months.   --Dr. Raeford Razor

## 2021-03-31 NOTE — Progress Notes (Signed)
Anita James - 70 y.o. female MRN [id number]  Date of birth: 1951-05-18  SUBJECTIVE:  Including CC & ROS.  No chief complaint on file.   Anita James is a 71 y.o. female that is presenting with acute on chronic bilateral knee pain.  Pain is occurring over the medial aspect of the knee bilaterally.  Denies any injury or inciting event.  She has had trouble with her weight gain recently as well.  Seems to be contributing to her knee pain.  She has trouble with ambulation..    Review of Systems See HPI   HISTORY: Past Medical, Surgical, Social, and Family History Reviewed & Updated per EMR.   Pertinent Historical Findings include:  Past Medical History:  Diagnosis Date   Anxiety    Carpal tunnel syndrome 06/14/2014   Chest pain    CHF (congestive heart failure) ([facility])    Chronic back pain    Chronic kidney disease 03/2013   failure due to meds   Diabetes mellitus type II, uncontrolled ([place])    Diverticulitis    Frequent headaches    Hiatal hernia    Hyperlipemia    Hypertension    Hypothyroidism    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   Neuropathy    Obesity    Pneumonia    Vitamin D deficiency 06/05/2014    Past Surgical History:  Procedure Laterality Date   CESAREAN SECTION     KNEE SURGERY Left    THYROID SURGERY     goiter    Family History  Problem Relation Age of Onset   Cancer Mother        cervical- mets   Breast cancer Mother    Cancer Father    Heart disease Father    Diabetes Father    Diabetes Sister    Breast cancer Sister    Heart disease Sister    Breast cancer Sister    Heart disease Sister    Breast cancer Sister    Breast cancer Sister    COPD Sister    Breast cancer Sister    Colon cancer Neg Hx    Esophageal cancer Neg Hx    Rectal cancer Neg Hx    Stomach cancer Neg Hx     Social History   Socioeconomic History   Marital status: Divorced    Spouse name: Not on file   Number of children: 6   Years of education: BA   Highest education  level: Not on file  Occupational History   Occupation: unemployed  Tobacco Use   Smoking status: Never   Smokeless tobacco: Never  Vaping Use   Vaping Use: Never used  Substance and Sexual Activity   Alcohol use: No   Drug use: No   Sexual activity: Not on file  Other Topics Concern   Not on file  Social History Narrative   Not on file   Social Determinants of Health   Financial Resource Strain: Not on file  Food Insecurity: Not on file  Transportation Needs: Not on file  Physical Activity: Not on file  Stress: Not on file  Social Connections: Not on file  Intimate Partner Violence: Not on file     PHYSICAL EXAM:  VS: BP 120/60   Ht 5\' 6"  (1.676 m)   Wt (!) 327 lb (148.3 kg)   BMI 52.78 kg/m  Physical Exam Gen: NAD, alert, cooperative with exam, well-appearing MSK:  Right and left knee: No obvious effusion. Range of motion. Normal  strength resistance. Neurovascularly intact   Aspiration/Injection Procedure Note Taryne Kiger 09-Jan-1951  Procedure: Injection Indications: Right knee pain  Procedure Details Consent: Risks of procedure as well as the alternatives and risks of each were explained to the (patient/caregiver).  Consent for procedure obtained. Time Out: Verified patient identification, verified procedure, site/side was marked, verified correct patient position, special equipment/implants available, medications/allergies/relevent history reviewed, required imaging and test results available.  Performed.  The area was cleaned with iodine and alcohol swabs.    The right knee superior lateral suprapatellar pouch was injected using 3 cc of 1% lidocaine on a 21-gauge 2 inch needle.  The syringe was switched and a mixture containing 1 cc's of 40 mg Kenalog and 4 cc's of 0.25% bupivacaine was injected .  Ultrasound was used. Images were obtained in long views showing the injection.     A sterile dressing was applied.  Patient did tolerate procedure  well.   Aspiration/Injection Procedure Note Ayvah Caroll [birth year]  Procedure: Injection Indications: left knee pain  Procedure Details Consent: Risks of procedure as well as the alternatives and risks of each were explained to the (patient/caregiver).  Consent for procedure obtained. Time Out: Verified patient identification, verified procedure, site/side was marked, verified correct patient position, special equipment/implants available, medications/allergies/relevent history reviewed, required imaging and test results available.  Performed.  The area was cleaned with iodine and alcohol swabs.    The left knee superior lateral suprapatellar pouch was injected using 3 cc of 1% lidocaine on a 21-gauge 2 inch needle.  The syringe was switched and a mixture containing 1 cc's of 40 mg Kenalog and 4 cc's of 0.25% bupivacaine was injected .  Ultrasound was used. Images were obtained in long views showing the injection.     A sterile dressing was applied.  Patient did tolerate procedure well.   ASSESSMENT & PLAN:   OA (osteoarthritis) of knee Acute on chronic in nature.  Degenerative changes playing a role. -Counseled on home exercise therapy and supportive care. - Bilateral injections today. -Referral to physical therapy -Could consider Zilretta in the future.  Morbid obesity due to excess calories ([place]) Has a hard time obtaining a healthy lifestyle and has gained weight more recently that is likely affecting her knee pain. -Referral to healthy weight and wellness.

## 2021-04-01 NOTE — Assessment & Plan Note (Signed)
Has a hard time obtaining a healthy lifestyle and has gained weight more recently that is likely affecting her knee pain. -Referral to healthy weight and wellness.

## 2021-04-01 NOTE — Assessment & Plan Note (Signed)
Acute on chronic in nature.  Degenerative changes playing a role. -Counseled on home exercise therapy and supportive care. - Bilateral injections today. -Referral to physical therapy -Could consider Zilretta in the future.

## 2021-06-19 ENCOUNTER — Encounter (HOSPITAL_COMMUNITY): Payer: Self-pay | Admitting: Gastroenterology

## 2021-06-20 NOTE — Progress Notes (Signed)
Attempted to obtain medical history via telephone, unable to reach at this time. I left a voicemail to return pre surgical testing department's phone call.  

## 2021-06-23 ENCOUNTER — Telehealth: Payer: Self-pay | Admitting: Family Medicine

## 2021-06-23 NOTE — Telephone Encounter (Signed)
Patient called states 3 months ago at last OV they discussed her possibly getting the Zilretta, she was  told to contact her Ins Co to see if they cvd it (pt says she contacted [facility] and was told it was a covered injectable) --Advised pt would call her back after spking w/ medical staff.  -- Call pt, no answer , msg left for pt to call office.  --glh

## 2021-06-24 ENCOUNTER — Telehealth: Payer: Self-pay | Admitting: Family Medicine

## 2021-06-24 NOTE — Telephone Encounter (Signed)
Called pt to advise of Zilretta's availability & schedule appt for Monday-- Patient was very nasty w/  speak & attitude & then hung up.(Unsure if pt to come Monday for appt) --glh

## 2021-06-24 NOTE — Telephone Encounter (Signed)
#  2 Zilretta ordered with [facility] pharmacy.

## 2021-06-26 NOTE — Telephone Encounter (Signed)
I called pt to offer to schedule her for her Zilretta injections. I informed her Dr. Raeford Razor has openings on 06/30/21 in the afternoon. She states she does not know her Monday schedule, she was very rude and ended the call.

## 2021-06-30 NOTE — H&P (Signed)
History of Present Illness  General:  70 year old female with history of congestive heart failure, type 2 diabetes with chronic kidney disease, hyperlipidemia, hypertension, hypothyroidism, hiatal hernia, morbid obesity presents for ER follow-up. Patient was seen in the ER 03/04/2021 with 1 day of bright red blood per rectum accompanied by abdominal discomfort.  Denies any history of GI bleeding.  Patient had CT abdomen and pelvis that was consistent with diverticulitis.  Hemoglobin stable at 11.7, patient discharged on Cipro and metronidazole.  She states she has been having loose stools, 1-2 x a day, depends on what she eats.  She continues to have some lower AB cramping pain and lower AB burning,BM can make it feel better.  She is still having bright red blood in the toliet, not on the TP or in the stool.  Rectal burning worse after BM.  She has some nausea, denies vomiting.  Denies any weight loss. Denies family history of colon cancer.  Last colonoscopy was 10 years ago at [facility], 2004 in [place] but unable to see report.  2 sets of twins 18 months apart, identical. Works at [employer].   Current Medications  Taking   Aspirin 81(Aspirin) 81 MG Tablet Chewable 1 tablet Orally Once a day  Albuterol Sulfate 0.63 MG/3ML Nebulization Solution 1 puff as needed Inhalation PRN  Carvedilol 12.5 MG Tablet 1 tablet with food Orally once a day  Dexlansoprazole 60 MG Capsule Delayed Release Oral   Ezetimibe 10 MG Tablet 1 tablet Oral at bedtime  Farxiga(Dapagliflozin Propanediol) 10 MG Tablet 1 tablet Orally Once a day  Fluconazole 150 MG Tablet 1 tablet Oral once a day  Gabapentin 300 MG Capsule 1 tablet Oral once a day  Insulin Aspart FlexPen 100 UNIT/ML Solution Pen-injector as directed Subcutaneous as directed  Lantus SoloStar(Insulin Glargine) 100 UNIT/ML Solution Pen-injector as directed Subcutaneous as directed  Lidocaine 5 % Patch 1 application as needed Externally  Once a day  Lisinopril 40 MG Tablet 1 tablet Orally as directed  Novofine Pen Needle(Insulin Pen Needle) 32G X 6 MM Miscellaneous as directed as directed  Pioglitazone HCl 30 MG Tablet 1 tablet Orally Once a day  Synthroid(L-Thyroxine Sodium) 100 MCG Tablet 1 tablet in the morning on an empty stomach Orally Once a day  Triamcinolone Acetonide 0.1 % Ointment as directed External Once a day  Medication List reviewed and reconciled with the patient   Past Medical History  HTN.   DM 2.   Hernias.   Thyroid issues.   Sleep apnea.   CKD stage 3.    Surgical History  2 c-sections   2 knee surgeries   2 thyroid surgeries   colonoscopy    Family History  Father: deceased  Mother: deceased, Cervical cancer  no family hx of colon ca, polyps, or liver dx.   Social History  General:  Tobacco use cigarettes: Never smoked, Tobacco history last updated 03/25/2021, Vaping No. Alcohol: yes, occasionally. no Recreational drug use, in past.    Allergies  N.K.D.A.   Hospitalization/Major Diagnostic Procedure  02/2021 rectal bleeding 03/2021   Review of Systems  GI PROCEDURE:  Pacemaker/ AICD no. Artificial heart valves no. MI/heart attack no. Abnormal heart rhythm no. Angina no. CVA no. Hypertension YES. Hypotension no. Asthma, COPD no. Sleep apnea YES, using CPAP. Seizure disorders no. Artificial joints no. Severe DJD no. Diabetes YES, type II. Significant headaches YES. Vertigo no. Depression/anxiety YES. Abnormal bleeding YES, Taking blood thinners. Kidney Disease YES CKD stage 3. Liver disease  no. Chance of pregnancy no. Blood transfusion no.     Vital Signs  Wt 327.6, Ht 66, BMI 52.87, Temp 97.5, Pulse sitting 83, BP sitting 168/69.   Examination  Gastroenterology Exam: GENERAL APPEARANCE: Well developed, morbidly obese, no active distress, pleasant. SCLERA: anicteric. NECK Full ROM, trachea midline, no thyromegaly or masses. RESPIRATORY clear to auscultation bilaterally , no  crackles or wheezes. CARDIOVASCULAR RRR no murmur, distant heart sounds. ABDOMEN Soft, bowel sounds present, no distension, non tender AB , no hepatosplenomegaly. PSYCHIATRIC Alert and oriented x3, mood and affect appear normal.. NEURO: alert, normal strength and tone.     Assessments     1. Diverticulitis - K57.92 (Primary)   2. Screen for colon cancer - Z12.11   3. Morbid obesity - E66.01   4. Chronic diastolic heart failure - V43.60   5. Long term (current) use of insulin - Z79.4   Treatment  1. Diverticulitis  Start Dicyclomine HCl Tablet, 20 MG, 1/2-1 tablet, Orally, three times a day as needed for diarrhea and cramping, 30 day(s), 45, Refills 0 Start Benefiber Powder, -, as directed, Orally, daily, 90 days, 1 Bottle, Refills 3 LAB: CBC with Diff LAB: Comp Metabolic Panel Notes: GET ON GENERIC BENEFIBER, TAKE 1 TBSP ONCE A DAY INCREASE YOUR FIBER Go to the ER if unable to pass gas, severe AB pain, unable to hold down food, any shortness of breath of chest pain.  Clinical Notes: Patient with some mild tenderness, still some bleeding, if her labs still show leukocytosis or not improving symptoms, will give another antibiotic.  Will give dicyclomine for possible IBS.  ER precautions.  .    2. Screen for colon cancer  IMAGING: Colonoscopy    Johnson,Shannon 03/25/2021 04:46:22 PM > , Pt scheduled 07-01-21 at 8:30 AM with Dr. Therisa Doyne. RX given for prep. Consent form signed. Instructions, bill of rights, and video instructions given to pt.   Clinical Notes: Has been over 10 years, will schedule 8-12 weeks out from diverticuluar flare to evaluate. Will need to be in the hospital.  We have discussed the risks of bleeding, infection, perforation, medication reactions, a 10-20% miss rate for small colon cancer or polyp and remote risk of death associated with colonoscopy. All questions were answered and the patient acknowledges these risk and wishes to proceed.    3. Morbid obesity   Clinical Notes: Will need to be done in the hospital.

## 2021-07-01 ENCOUNTER — Encounter (HOSPITAL_COMMUNITY)
Admission: RE | Disposition: A | Discharge status: Home / Self Care | Payer: Self-pay | Source: Home / Self Care | Attending: Gastroenterology

## 2021-07-01 ENCOUNTER — Ambulatory Visit (HOSPITAL_COMMUNITY): Payer: 59 | Admitting: Certified Registered Nurse Anesthetist

## 2021-07-01 ENCOUNTER — Encounter (HOSPITAL_COMMUNITY): Payer: Self-pay | Admitting: Gastroenterology

## 2021-07-01 ENCOUNTER — Other Ambulatory Visit: Payer: Self-pay

## 2021-07-01 ENCOUNTER — Ambulatory Visit (HOSPITAL_COMMUNITY)
Admission: RE | Admit: 2021-07-01 | Discharge: 2021-07-01 | Disposition: A | Discharge status: Home / Self Care | Payer: 59 | Attending: Gastroenterology | Admitting: Gastroenterology

## 2021-07-01 DIAGNOSIS — Z6841 Body Mass Index (BMI) 40.0 and over, adult: Secondary | ICD-10-CM | POA: Diagnosis not present

## 2021-07-01 DIAGNOSIS — I130 Hypertensive heart and chronic kidney disease with heart failure and stage 1 through stage 4 chronic kidney disease, or unspecified chronic kidney disease: Secondary | ICD-10-CM | POA: Diagnosis not present

## 2021-07-01 DIAGNOSIS — Z7982 Long term (current) use of aspirin: Secondary | ICD-10-CM | POA: Diagnosis not present

## 2021-07-01 DIAGNOSIS — E039 Hypothyroidism, unspecified: Secondary | ICD-10-CM | POA: Insufficient documentation

## 2021-07-01 DIAGNOSIS — K5730 Diverticulosis of large intestine without perforation or abscess without bleeding: Secondary | ICD-10-CM | POA: Diagnosis not present

## 2021-07-01 DIAGNOSIS — Z794 Long term (current) use of insulin: Secondary | ICD-10-CM | POA: Diagnosis not present

## 2021-07-01 DIAGNOSIS — Z1211 Encounter for screening for malignant neoplasm of colon: Secondary | ICD-10-CM | POA: Diagnosis present

## 2021-07-01 DIAGNOSIS — N183 Chronic kidney disease, stage 3 unspecified: Secondary | ICD-10-CM | POA: Diagnosis not present

## 2021-07-01 DIAGNOSIS — E1122 Type 2 diabetes mellitus with diabetic chronic kidney disease: Secondary | ICD-10-CM | POA: Insufficient documentation

## 2021-07-01 DIAGNOSIS — I5032 Chronic diastolic (congestive) heart failure: Secondary | ICD-10-CM | POA: Insufficient documentation

## 2021-07-01 DIAGNOSIS — E785 Hyperlipidemia, unspecified: Secondary | ICD-10-CM | POA: Insufficient documentation

## 2021-07-01 HISTORY — PX: COLONOSCOPY WITH PROPOFOL: SHX5780

## 2021-07-01 LAB — GLUCOSE, CAPILLARY
Glucose-Capillary: 106 mg/dL — ABNORMAL HIGH (ref 70–99)
Glucose-Capillary: 96 mg/dL (ref 70–99)

## 2021-07-01 SURGERY — COLONOSCOPY WITH PROPOFOL
Anesthesia: Monitor Anesthesia Care

## 2021-07-01 MED ORDER — PROPOFOL 500 MG/50ML IV EMUL
INTRAVENOUS | Status: DC | PRN
  Administered 2021-07-01: 75 ug/kg/min via INTRAVENOUS

## 2021-07-01 MED ORDER — PROPOFOL 1000 MG/100ML IV EMUL
INTRAVENOUS | Status: AC
  Filled 2021-07-01: qty 100

## 2021-07-01 MED ORDER — SODIUM CHLORIDE 0.9 % IV SOLN: INTRAVENOUS | Status: DC | PRN

## 2021-07-01 MED ORDER — PROPOFOL 10 MG/ML IV BOLUS
INTRAVENOUS | Status: DC | PRN
  Administered 2021-07-01 (×4): 20 mg via INTRAVENOUS
  Administered 2021-07-01: 30 mg via INTRAVENOUS

## 2021-07-01 MED ORDER — LIDOCAINE 2% (20 MG/ML) 5 ML SYRINGE
INTRAMUSCULAR | Status: DC | PRN
  Administered 2021-07-01: 100 mg via INTRAVENOUS

## 2021-07-01 SURGICAL SUPPLY — 21 items

## 2021-07-01 NOTE — Transfer of Care (Signed)
Immediate Anesthesia Transfer of Care Note  Patient: Emonnie Cannady  Procedure(s) Performed: COLONOSCOPY WITH PROPOFOL  Patient Location: Endoscopy Unit  Anesthesia Type:MAC  Level of Consciousness: drowsy and patient cooperative  Airway & Oxygen Therapy: Patient Spontanous Breathing and Patient connected to face mask oxygen  Post-op Assessment: Report given to RN and Post -op Vital signs reviewed and stable  Post vital signs: Reviewed and stable  Last Vitals:  Vitals Value Taken Time  BP 147/77 07/01/21 0900  Temp    Pulse 81 07/01/21 0901  Resp 21 07/01/21 0901  SpO2 100 % 07/01/21 0901  Vitals shown include unvalidated device data.  Last Pain:  Vitals:   07/01/21 0900  TempSrc:   PainSc: 0-No pain         Complications: No notable events documented.

## 2021-07-01 NOTE — Anesthesia Postprocedure Evaluation (Signed)
Anesthesia Post Note  Patient: Anita James  Procedure(s) Performed: COLONOSCOPY WITH PROPOFOL     Patient location during evaluation: Endoscopy Anesthesia Type: MAC Level of consciousness: awake and alert, patient cooperative and oriented Pain management: pain level controlled Vital Signs Assessment: post-procedure vital signs reviewed and stable Respiratory status: spontaneous breathing, nonlabored ventilation and respiratory function stable Cardiovascular status: blood pressure returned to baseline and stable Postop Assessment: no apparent nausea or vomiting and able to ambulate Anesthetic complications: no   No notable events documented.  Last Vitals:  Vitals:   07/01/21 0910 07/01/21 0920  BP: (!) 163/75 (!) 182/81  Pulse: 78 76  Resp: (!) 22 (!) 22  Temp:    SpO2: 94% 96%    Last Pain:  Vitals:   07/01/21 0920  TempSrc:   PainSc: 0-No pain                 Genesee Nase,E. Briget Shaheed

## 2021-07-01 NOTE — Op Note (Signed)
[facility] Patient Name: Anita James Procedure Date: 07/01/2021 MRN: [id number] Attending MD: [name] , MD Date of Birth: 04/08/1951 CSN: [id number] Age: 70 Admit Type: Outpatient Procedure:                Colonoscopy Indications:              Screening for colorectal malignant neoplasm, Last                            colonoscopy: 2004, history of diverticulitis Providers:                [name], MD, Particia Nearing, RN, Cherylynn Ridges,                            Technician, Luan Moore, Technician, Adair Laundry, CRNA Referring MD:             Valora Piccolo Medicines:                Monitored Anesthesia Care Complications:            No immediate complications. Estimated Blood Loss:     Estimated blood loss: none. Procedure:                Pre-Anesthesia Assessment:                           - Prior to the procedure, a History and Physical                            was performed, and patient medications and                            allergies were reviewed. The patient's tolerance of                            previous anesthesia was also reviewed. The risks                            and benefits of the procedure and the sedation                            options and risks were discussed with the patient.                            All questions were answered, and informed consent                            was obtained. Prior Anticoagulants: The patient has                            taken no previous anticoagulant or antiplatelet                            agents except for  aspirin. ASA Grade Assessment:                            III - A patient with severe systemic disease. After                            reviewing the risks and benefits, the patient was                            deemed in satisfactory condition to undergo the                            procedure.                           After obtaining informed  consent, the colonoscope                            was passed under direct vision. Throughout the                            procedure, the patient's blood pressure, pulse, and                            oxygen saturations were monitored continuously. The                            PCF-HQ190L ([id number]) Olympus colonoscope was                            introduced through the anus and advanced to the the                            terminal ileum. The colonoscopy was performed                            without difficulty. The patient tolerated the                            procedure well. The quality of the bowel                            preparation was fair. Scope In: 8:29:02 AM Scope Out: 8:50:19 AM Scope Withdrawal Time: 0 hours 16 minutes 55 seconds  Total Procedure Duration: 0 hours 21 minutes 17 seconds  Findings:      The perianal and digital rectal examinations were normal.      Semi-liquid, semi-solidm solid stool, undigested retained food particles       (?potato skins) was found in the sigmoid colon, in the descending colon,       in the transverse colon, at the hepatic flexure, in the ascending colon       and in the cecum, interfering with visualization. Lavage of the area was       performed, resulting in incomplete clearance with fair visualization.      The terminal ileum  appeared normal.      A few small-mouthed diverticula were found in the sigmoid colon.      The exam was otherwise without abnormality on direct and retroflexion       views. Impression:               - Preparation of the colon was fair.                           - Stool in the sigmoid colon, in the descending                            colon, in the transverse colon, at the hepatic                            flexure, in the ascending colon and in the cecum.                           - The examined portion of the ileum was normal.                           - Diverticulosis in the sigmoid colon.                            - The examination was otherwise normal on direct                            and retroflexion views.                           - No specimens collected. Moderate Sedation:      Patient did not receive moderate sedation for this procedure, but       instead received monitored anesthesia care. Recommendation:           - Patient has a contact number available for                            emergencies. The signs and symptoms of potential                            delayed complications were discussed with the                            patient. Return to normal activities tomorrow.                            Written discharge instructions were provided to the                            patient.                           - High fiber diet.                           - Continue present medications.                           -  Repeat colonoscopy in 5 years for screening                            purposes due to fair prep. Procedure Code(s):        --- Professional ---                           M2111, Colorectal cancer screening; colonoscopy on                            individual not meeting criteria for high risk Diagnosis Code(s):        --- Professional ---                           Z12.11, Encounter for screening for malignant                            neoplasm of colon                           K57.30, Diverticulosis of large intestine without                            perforation or abscess without bleeding CPT copyright 2019 American Medical Association. All rights reserved. The codes documented in this report are preliminary and upon coder review may  be revised to meet current compliance requirements. [name], MD 07/01/2021 8:56:30 AM This report has been signed electronically. Number of Addenda: 0

## 2021-07-01 NOTE — Interval H&P Note (Signed)
History and Physical Interval Note:  69/female with morbid obesity for screening colonoscopy.She has history of diverticulitis. 07/01/2021 8:13 AM  Anita James  has presented today for Colonoscopy, with the diagnosis of SCREENING.  The various methods of treatment have been discussed with the patient and family. After consideration of risks, benefits and other options for treatment, the patient has consented to  Procedure(s): COLONOSCOPY WITH PROPOFOL (N/A) as a surgical intervention.  The patient's history has been reviewed, patient examined, no change in status, stable for surgery.  I have reviewed the patient's chart and labs.  Questions were answered to the patient's satisfaction.     [name]

## 2021-07-01 NOTE — Anesthesia Preprocedure Evaluation (Addendum)
Anesthesia Evaluation  Patient identified by MRN, date of birth, ID band Patient awake    Reviewed: Allergy & Precautions, NPO status , Patient's Chart, lab work & pertinent test results, reviewed documented beta blocker date and time   History of Anesthesia Complications Negative for: history of anesthetic complications  Airway Mallampati: II  TM Distance: >3 FB Neck ROM: Full    Dental  (+) Poor Dentition, Chipped, Missing, Dental Advisory Given   Pulmonary sleep apnea and Continuous Positive Airway Pressure Ventilation , COPD,  COPD inhaler,    breath sounds clear to auscultation       Cardiovascular hypertension, Pt. on medications and Pt. on home beta blockers (-) angina Rhythm:Regular Rate:Normal  '19 ECHO: mild focal basal hypertrophy of the septum. Systolic function was normal. EF 60-65%. Wall motion was normal; no regional wall motion abnormalities. Grade 1 DD, no significant valvular abnormalities   Neuro/Psych  Headaches, Anxiety Depression    GI/Hepatic Neg liver ROS, GERD  Medicated and Controlled,  Endo/Other  diabetes (glu 106)Hypothyroidism Morbid obesity  Renal/GU Renal InsufficiencyRenal disease     Musculoskeletal   Abdominal (+) + obese,   Peds  Hematology negative hematology ROS (+)   Anesthesia Other Findings   Reproductive/Obstetrics                            Anesthesia Physical Anesthesia Plan  ASA: 3  Anesthesia Plan: MAC   Post-op Pain Management:    Induction:   PONV Risk Score and Plan: 2 and Ondansetron and Treatment may vary due to age or medical condition  Airway Management Planned: Natural Airway and Simple Face Mask  Additional Equipment: None  Intra-op Plan:   Post-operative Plan:   Informed Consent: I have reviewed the patients History and Physical, chart, labs and discussed the procedure including the risks, benefits and alternatives for the  proposed anesthesia with the patient or authorized representative who has indicated his/her understanding and acceptance.     Dental advisory given  Plan Discussed with: CRNA and Surgeon  Anesthesia Plan Comments:        Anesthesia Quick Evaluation

## 2021-07-01 NOTE — Discharge Instructions (Signed)
YOU HAD AN ENDOSCOPIC PROCEDURE TODAY: Refer to the procedure report and other information in the discharge instructions given to you for any specific questions about what was found during the examination. If this information does not answer your questions, please call [facility] office at [phone] to clarify.   YOU SHOULD EXPECT: Some feelings of bloating in the abdomen. Passage of more gas than usual. Walking can help get rid of the air that was put into your GI tract during the procedure and reduce the bloating. If you had a lower endoscopy (such as a colonoscopy or flexible sigmoidoscopy) you may notice spotting of blood in your stool or on the toilet paper. Some abdominal soreness may be present for a day or two, also.  DIET: Your first meal following the procedure should be a light meal and then it is ok to progress to your normal diet. A half-sandwich or bowl of soup is an example of a good first meal. Heavy or fried foods are harder to digest and may make you feel nauseous or bloated. Drink plenty of fluids but you should avoid alcoholic beverages for 24 hours.    ACTIVITY: Your care partner should take you home directly after the procedure. You should plan to take it easy, moving slowly for the rest of the day. You can resume normal activity the day after the procedure however YOU SHOULD NOT DRIVE, use power tools, machinery or perform tasks that involve climbing or major physical exertion for 24 hours (because of the sedation medicines used during the test).   SYMPTOMS TO REPORT IMMEDIATELY: A gastroenterologist can be reached at any hour. Please call [phone]  for any of the following symptoms:  Following lower endoscopy (colonoscopy, flexible sigmoidoscopy) Excessive amounts of blood in the stool  Significant tenderness, worsening of abdominal pains  Swelling of the abdomen that is new, acute  Fever of 100 or higher    FOLLOW UP:  If any biopsies were taken you will be contacted  by phone or by letter within the next 1-3 weeks. Call [phone]  if you have not heard about the biopsies in 3 weeks.  Please also call with any specific questions about appointments or follow up tests.

## 2021-07-03 ENCOUNTER — Encounter (HOSPITAL_COMMUNITY): Payer: Self-pay | Admitting: Gastroenterology

## 2021-07-04 ENCOUNTER — Emergency Department (HOSPITAL_COMMUNITY)
Admission: EM | Admit: 2021-07-04 | Discharge: 2021-07-04 | Disposition: A | Discharge status: Home / Self Care | Payer: 59 | Attending: Emergency Medicine | Admitting: Emergency Medicine

## 2021-07-04 ENCOUNTER — Encounter (HOSPITAL_COMMUNITY): Payer: Self-pay

## 2021-07-04 ENCOUNTER — Other Ambulatory Visit: Payer: Self-pay

## 2021-07-04 ENCOUNTER — Emergency Department (HOSPITAL_COMMUNITY): Payer: 59

## 2021-07-04 DIAGNOSIS — I130 Hypertensive heart and chronic kidney disease with heart failure and stage 1 through stage 4 chronic kidney disease, or unspecified chronic kidney disease: Secondary | ICD-10-CM | POA: Diagnosis not present

## 2021-07-04 DIAGNOSIS — Z794 Long term (current) use of insulin: Secondary | ICD-10-CM | POA: Diagnosis not present

## 2021-07-04 DIAGNOSIS — M79605 Pain in left leg: Secondary | ICD-10-CM | POA: Diagnosis not present

## 2021-07-04 DIAGNOSIS — Z7982 Long term (current) use of aspirin: Secondary | ICD-10-CM | POA: Diagnosis not present

## 2021-07-04 DIAGNOSIS — I5032 Chronic diastolic (congestive) heart failure: Secondary | ICD-10-CM | POA: Insufficient documentation

## 2021-07-04 DIAGNOSIS — M545 Low back pain, unspecified: Secondary | ICD-10-CM | POA: Insufficient documentation

## 2021-07-04 DIAGNOSIS — E1122 Type 2 diabetes mellitus with diabetic chronic kidney disease: Secondary | ICD-10-CM | POA: Diagnosis not present

## 2021-07-04 DIAGNOSIS — Z79899 Other long term (current) drug therapy: Secondary | ICD-10-CM | POA: Diagnosis not present

## 2021-07-04 DIAGNOSIS — E039 Hypothyroidism, unspecified: Secondary | ICD-10-CM | POA: Insufficient documentation

## 2021-07-04 DIAGNOSIS — Z7984 Long term (current) use of oral hypoglycemic drugs: Secondary | ICD-10-CM | POA: Insufficient documentation

## 2021-07-04 DIAGNOSIS — M25552 Pain in left hip: Secondary | ICD-10-CM | POA: Insufficient documentation

## 2021-07-04 DIAGNOSIS — N189 Chronic kidney disease, unspecified: Secondary | ICD-10-CM | POA: Insufficient documentation

## 2021-07-04 MED ORDER — LIDOCAINE 5 % EX PTCH: 1.0000 | MEDICATED_PATCH | Freq: Two times a day (BID) | CUTANEOUS | 2 refills | Status: DC

## 2021-07-04 MED ORDER — ACETAMINOPHEN 500 MG PO TABS
1000.0000 mg | ORAL_TABLET | Freq: Once | ORAL | Status: AC
  Administered 2021-07-04: 1000 mg via ORAL
  Filled 2021-07-04: qty 2

## 2021-07-04 NOTE — Discharge Instructions (Addendum)
Your x-ray today looks good.  No signs of fracture in your hip.  I suspect you are bruised from your colonoscopy on Tuesday.  Please continue to utilize Tylenol and lidocaine patches.  You may also use heat or ice, whichever 1 makes you feel better.  Please follow-up with your primary care provider if the symptoms are not resolving over the next week.  Also please follow-up with your cardiologist about your fluid pill.  You have fluid retention in your legs that may require a higher dose of torsemide.  It was a pleasure to meet you today and I hope that you feel better!

## 2021-07-04 NOTE — ED Notes (Signed)
N/A for rooming x 1 
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
## 2021-07-04 NOTE — ED Triage Notes (Signed)
Pt c/o left sided hip pain. Pt states that it started after her colonoscopy on Tuesday. Pt also notes swelling to her left hip. Pt states pain whether sitting/standing/laying. Pt ambulatory in triage.

## 2021-07-08 ENCOUNTER — Telehealth: Payer: Self-pay | Admitting: Family Medicine

## 2021-07-08 ENCOUNTER — Ambulatory Visit: Payer: 59 | Admitting: Family Medicine

## 2021-07-08 NOTE — Progress Notes (Deleted)
  Anita James - 70 y.o. female MRN [id number]  Date of birth: [birth year]  SUBJECTIVE:  Including CC & ROS.  No chief complaint on file.   Anita James is a 70 y.o. female that is  ***.  ***   Review of Systems See HPI   HISTORY: Past Medical, Surgical, Social, and Family History Reviewed & Updated per EMR.   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   Neuropathy    Obesity    Pneumonia    Vitamin D deficiency 06/05/2014    Past Surgical History:  Procedure Laterality Date   CESAREAN SECTION     COLONOSCOPY WITH PROPOFOL N/A 07/01/2021   Procedure: COLONOSCOPY WITH PROPOFOL;  Surgeon: [name], MD;  Location: [facility];  Service: Gastroenterology;  Laterality: N/A;   KNEE SURGERY Left    THYROID SURGERY     goiter    Family History  Problem Relation Age of Onset   Cancer Mother        cervical- mets   Breast cancer Mother    Cancer Father    Heart disease Father    Diabetes Father    Diabetes Sister    Breast cancer Sister    Heart disease Sister    Breast cancer Sister    Heart disease Sister    Breast cancer Sister    Breast cancer Sister    COPD Sister    Breast cancer Sister    Colon cancer Neg Hx    Esophageal cancer Neg Hx    Rectal cancer Neg Hx    Stomach cancer Neg Hx     Social History   Socioeconomic History   Marital status: Divorced    Spouse name: Not on file   Number of children: 6   Years of education: BA   Highest education level: Not on file  Occupational History   Occupation: unemployed  Tobacco Use   Smoking status: Never    Smokeless tobacco: Never  Vaping Use   Vaping Use: Never used  Substance and Sexual Activity   Alcohol use: No   Drug use: No   Sexual activity: Not on file  Other Topics Concern   Not on file  Social History Narrative   Not on file   Social Determinants of Health   Financial Resource Strain: Not on file  Food Insecurity: Not on file  Transportation Needs: Not on file  Physical Activity: Not on file  Stress: Not on file  Social Connections: Not on file  Intimate Partner Violence: Not on file     PHYSICAL EXAM:  VS: There were no vitals taken for this visit. Physical Exam Gen: NAD, alert, cooperative with exam, well-appearing MSK:  ***      ASSESSMENT & PLAN:   No problem-specific Assessment & Plan notes found for this encounter.

## 2021-07-08 NOTE — Telephone Encounter (Signed)
Patient cld to chk her appt time --advised it was 4:10 she ask if provider was running on time w/his pt & I shared there were pt's in the lobby--ask if she wanted to reschedule to tomorrow 10/19 offered 11:10 appt time--no answer she hung up.   ---Called pt back  15 min later shared that pt had cancel a 2:10 app 10/19 left msg offering that appt time as well ... No response before office clsing time..  -glh

## 2021-07-18 ENCOUNTER — Other Ambulatory Visit: Payer: Self-pay

## 2021-07-18 ENCOUNTER — Encounter (HOSPITAL_COMMUNITY): Payer: Self-pay

## 2021-07-18 ENCOUNTER — Emergency Department (HOSPITAL_COMMUNITY)
Admission: EM | Admit: 2021-07-18 | Discharge: 2021-07-19 | Disposition: A | Discharge status: Home / Self Care | Payer: 59 | Attending: Emergency Medicine | Admitting: Emergency Medicine

## 2021-07-18 ENCOUNTER — Emergency Department (HOSPITAL_COMMUNITY): Payer: 59

## 2021-07-18 DIAGNOSIS — Z79899 Other long term (current) drug therapy: Secondary | ICD-10-CM | POA: Insufficient documentation

## 2021-07-18 DIAGNOSIS — I5030 Unspecified diastolic (congestive) heart failure: Secondary | ICD-10-CM | POA: Diagnosis not present

## 2021-07-18 DIAGNOSIS — I130 Hypertensive heart and chronic kidney disease with heart failure and stage 1 through stage 4 chronic kidney disease, or unspecified chronic kidney disease: Secondary | ICD-10-CM | POA: Insufficient documentation

## 2021-07-18 DIAGNOSIS — Z794 Long term (current) use of insulin: Secondary | ICD-10-CM | POA: Diagnosis not present

## 2021-07-18 DIAGNOSIS — E039 Hypothyroidism, unspecified: Secondary | ICD-10-CM | POA: Insufficient documentation

## 2021-07-18 DIAGNOSIS — E1122 Type 2 diabetes mellitus with diabetic chronic kidney disease: Secondary | ICD-10-CM | POA: Diagnosis not present

## 2021-07-18 DIAGNOSIS — Z7982 Long term (current) use of aspirin: Secondary | ICD-10-CM | POA: Diagnosis not present

## 2021-07-18 DIAGNOSIS — Z7984 Long term (current) use of oral hypoglycemic drugs: Secondary | ICD-10-CM | POA: Diagnosis not present

## 2021-07-18 DIAGNOSIS — N189 Chronic kidney disease, unspecified: Secondary | ICD-10-CM | POA: Diagnosis not present

## 2021-07-18 DIAGNOSIS — M25561 Pain in right knee: Secondary | ICD-10-CM | POA: Insufficient documentation

## 2021-07-18 LAB — CBG MONITORING, ED: Glucose-Capillary: 110 mg/dL — ABNORMAL HIGH (ref 70–99)

## 2021-07-18 NOTE — ED Triage Notes (Signed)
Pt BIB EMS, pt complains of right knee pain since today. Pt reports having a fractured knee a year ago. No recent falls.

## 2021-07-19 DIAGNOSIS — M25561 Pain in right knee: Secondary | ICD-10-CM | POA: Diagnosis not present

## 2021-07-19 MED ORDER — OXYCODONE HCL 5 MG PO TABS: 5.0000 mg | ORAL_TABLET | ORAL | 0 refills | Status: DC | PRN

## 2021-07-19 MED ORDER — OXYCODONE-ACETAMINOPHEN 5-325 MG PO TABS
1.0000 | ORAL_TABLET | Freq: Once | ORAL | Status: AC
  Administered 2021-07-19: 1 via ORAL
  Filled 2021-07-19: qty 1

## 2021-07-19 NOTE — Discharge Instructions (Addendum)
You were seen in the ER today for your knee pain.  There is no sign of infection on your physical exam and your x-ray did not reveal any broken bones.  Do suspect that your swelling and pain is secondary to inflammation from your degenerative joint disease.  Please follow-up with orthopedist.  You may use Tylenol as needed and the prescribed pain medication.  Please ice the area and use your walker to walk at home.  Return to the ER with any new severe symptoms.

## 2021-07-19 NOTE — ED Provider Notes (Signed)
[facility] DEPT Provider Note   CSN: [id number] Arrival date & time: 07/18/21  2103     History Chief Complaint  Patient presents with   Knee Pain    Anita James is a 70 y.o. female who presents with 24 hours of right knee pain without fall or recent trauma.  Does have a history of fracture in the right knee.  States that knee is swollen relative to the left.  Has used Tylenol at home without relief.  Was ambulatory today with a walker which she does not typically use at baseline.  I have personally reviewed this patient's medical records.  She is history of CHF, CKD, hypothyroidism, hypertension, diabetes, and obesity.  She is not anticoagulated.  She has no history of recent injections into the knee.  HPI     Past Medical History:  Diagnosis Date   Anxiety    Carpal tunnel syndrome 06/14/2014   Chest pain    CHF (congestive heart failure) (HCC)    Chronic back pain    Chronic kidney disease 03/2013   failure due to meds   Diabetes mellitus type II, uncontrolled    Diverticulitis    Frequent headaches    Hiatal hernia    Hyperlipemia    Hypertension    Hypothyroidism    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   Neuropathy    Obesity    Pneumonia    Vitamin D deficiency 06/05/2014    Patient Active Problem List   Diagnosis Date Noted   Osteopenia of neck of left femur 05/09/2020   Chondrocalcinosis 04/29/2020   OA (osteoarthritis) of knee 04/15/2020   Right hand pain 04/15/2020   OSA on CPAP 05/19/2017   Morbid obesity due to excess calories ([place]) 03/17/2017   Neuropathic pain 11/04/2016   Carpal tunnel syndrome 06/14/2014   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   Cervical radiculopathy at C8 06/05/2014   Hyperlipemia 97/35/3299   Diastolic CHF ([place]) 24/26/8341   Major depression 09/12/2013   Allergic rhinitis 06/22/2013   Chronic pain syndrome 05/31/2013   Gastroesophageal reflux disease without esophagitis 08/30/2012   Essential  hypertension    Hypothyroidism    Type 2 diabetes, uncontrolled, with neuropathy     Past Surgical History:  Procedure Laterality Date   CESAREAN SECTION     COLONOSCOPY WITH PROPOFOL N/A 07/01/2021   Procedure: COLONOSCOPY WITH PROPOFOL;  Surgeon: [name], MD;  Location: [facility];  Service: Gastroenterology;  Laterality: N/A;   KNEE SURGERY Left    THYROID SURGERY     goiter     OB History     Gravida  4   Para  3   Term  2   Preterm  1   AB  1   Living  6      SAB      IAB      Ectopic  1   Multiple  2   Live Births  5           Family History  Problem Relation Age of Onset   Cancer Mother        cervical- mets   Breast cancer Mother    Cancer Father    Heart disease Father    Diabetes Father    Diabetes Sister    Breast cancer Sister    Heart disease Sister    Breast cancer Sister    Heart disease Sister    Breast cancer Sister  Breast cancer Sister    COPD Sister    Breast cancer Sister    Colon cancer Neg Hx    Esophageal cancer Neg Hx    Rectal cancer Neg Hx    Stomach cancer Neg Hx     Social History   Tobacco Use   Smoking status: Never   Smokeless tobacco: Never  Vaping Use   Vaping Use: Never used  Substance Use Topics   Alcohol use: No   Drug use: No    Home Medications Prior to Admission medications   Medication Sig Start Date End Date Taking? Authorizing Provider  oxyCODONE (ROXICODONE) 5 MG immediate release tablet Take 1 tablet (5 mg total) by mouth every 4 (four) hours as needed for severe pain. 07/19/21  Yes , Eugene Garnet R, PA-C  Accu-Chek FastClix Lancets MISC USE AS DIRECTED TO CHECK BLOOD SUGAR THREE TIMES DAILY 11/07/19   Ladell Pier, MD  ACCU-CHEK GUIDE test strip USE AS DIRECTED TO CHECK BLOOD SUGAR THREE TIMES DAILY 10/04/19   Ladell Pier, MD  acetaminophen (TYLENOL) 500 MG tablet Take 1 tablet (500 mg total) by mouth every 6 (six) hours as needed. 09/16/18   Darlin Drop P,  PA-C  albuterol (ACCUNEB) 0.63 MG/3ML nebulizer solution Take 1 ampule by nebulization 3 (three) times daily as needed for wheezing or shortness of breath. 01/07/21   [provider]  aspirin EC 81 MG tablet Take 81 mg by mouth daily. 11/20/16   [provider]  Blood Glucose Monitoring Suppl (ACCU-CHEK AVIVA PLUS) w/Device KIT USE AS DIRECTED TO TEST TWICE DAILY 04/27/18   Ladell Pier, MD  carvedilol (COREG) 12.5 MG tablet Take 1.5 tablets (18.75 mg total) by mouth 2 (two) times daily with a meal. 10/13/19   Ladell Pier, MD  cetirizine (ZYRTEC) 10 MG tablet Take 10 mg by mouth daily as needed for allergies.    [provider]  ciprofloxacin (CIPRO) 500 MG tablet Take 1 tablet (500 mg total) by mouth 2 (two) times daily. Patient not taking: No sig reported 03/04/21   Lacretia Leigh, MD  DEXILANT 60 MG capsule TAKE ONE CAPSULE BY MOUTH DAILY Patient taking differently: Take 60 mg by mouth daily. 01/15/20   Ladell Pier, MD  diclofenac sodium (VOLTAREN) 1 % GEL Apply 4 g topically 4 (four) times daily. Patient taking differently: Apply 4 g topically daily as needed (pain). 02/27/19   Deno Etienne, DO  ezetimibe (ZETIA) 10 MG tablet Take 10 mg by mouth every evening. 02/05/20   [provider]  FARXIGA 10 MG TABS tablet Take 10 mg by mouth daily. 03/16/20   [provider]  fluticasone (FLONASE) 50 MCG/ACT nasal spray PLACE 2 SPRAYS IN EACH NOSTRIL DAILY AS NEEDED FOR ALLERGIES 07/21/19   Ladell Pier, MD  gabapentin (NEURONTIN) 300 MG capsule TAKE 2 CAPSULES BY MOUTH TWICE DAILY 08/01/20   Ladell Pier, MD  insulin aspart (NOVOLOG FLEXPEN) 100 UNIT/ML FlexPen Inject 60 Units into the skin 3 (three) times daily with meals. Patient taking differently: Inject 65 Units into the skin 3 (three) times daily with meals. 12/05/19   Ladell Pier, MD  Insulin Glargine (LANTUS SOLOSTAR) 100 UNIT/ML Solostar Pen Inject 84 Units into the skin  daily. Patient taking differently: Inject 86 Units into the skin daily. 10/13/19   Ladell Pier, MD  Insulin Pen Needle (TRUEPLUS PEN NEEDLES) 32G X 4 MM MISC Use to inject insulin. 10/13/18   Ladell Pier,  MD  levothyroxine (SYNTHROID) 100 MCG tablet Take 1 tablet (100 mcg total) by mouth daily. 10/13/19   Ladell Pier, MD  lidocaine (LIDODERM) 5 % Place 1 patch onto the skin every 12 (twelve) hours. Remove & Discard patch within 12 hours or as directed by MD 07/04/21   Redwine, Madison A, PA-C  lisinopril (ZESTRIL) 40 MG tablet TAKE 1 TABLET BY MOUTH DAILY 11/10/19   Ladell Pier, MD  LORazepam (ATIVAN) 0.5 MG tablet Take 0.5 mg by mouth 2 (two) times daily as needed for anxiety. 12/17/20   [provider]  metroNIDAZOLE (FLAGYL) 500 MG tablet Take 1 tablet (500 mg total) by mouth 2 (two) times daily. Patient not taking: No sig reported 03/04/21   Lacretia Leigh, MD  mometasone (NASONEX) 50 MCG/ACT nasal spray Place 2 sprays into the nose daily. Patient taking differently: Place 2 sprays into the nose daily as needed (allergies). 08/24/17   Ladell Pier, MD  NOVOFINE 32G X 6 MM MISC SMARTSIG:1 SUB-Q 4-5 Times Daily 01/22/20   [provider]  NYSTATIN powder APPLY TOPICALLY 4 TIMES DAILY. Patient taking differently: Apply 1 Bottle topically 4 (four) times daily as needed (rash). 01/18/17   Langeland, Leda Quail, MD  ondansetron (ZOFRAN) 4 MG tablet Take 1 tablet (4 mg total) by mouth every 6 (six) hours. Patient taking differently: Take 4 mg by mouth every 6 (six) hours as needed for nausea or vomiting. 09/16/18   Darlin Drop P, PA-C  pioglitazone (ACTOS) 30 MG tablet Take 30 mg by mouth daily. 01/07/21   [provider]  predniSONE (DELTASONE) 5 MG tablet Take 6 pills for first day, 5 pills second day, 4 pills third day, 3 pills fourth day, 2 pills the fifth day, and 1 pill sixth day. Patient not taking: No sig reported 04/15/20   Rosemarie Ax, MD   Semaglutide,0.25 or 0.5MG/DOS, (OZEMPIC, 0.25 OR 0.5 MG/DOSE,) 2 MG/1.5ML SOPN Inject 0.25 mg into the skin every Friday.    [provider]  torsemide (DEMADEX) 20 MG tablet Take 20 mg by mouth daily as needed (fluid). 05/09/18   [provider]  triamcinolone ointment (KENALOG) 0.1 % Apply 1 application topically 2 (two) times daily. To affected areas Patient taking differently: Apply 1 application topically daily. To affected areas 12/31/20   Rosemarie Ax, MD  valACYclovir (VALTREX) 1000 MG tablet Take 1 tablet (1,000 mg total) by mouth 2 (two) times daily. Patient not taking: No sig reported 05/09/19   Charlott Rakes, MD  Vitamin D, Ergocalciferol, (DRISDOL) 1.25 MG (50000 UNIT) CAPS capsule Take 50,000 Units by mouth every Wednesday.    [provider]    Allergies    Amoxicillin-pot clavulanate, Hydrocodone-acetaminophen, Oxycodone, Sulfa antibiotics, Norvasc [amlodipine besylate], Sulfamethoxazole, and Tramadol  Review of Systems   Review of Systems  Constitutional: Negative.   HENT: Negative.    Respiratory: Negative.    Cardiovascular: Negative.   Gastrointestinal: Negative.   Genitourinary: Negative.   Musculoskeletal:  Positive for arthralgias.  Skin: Negative.   Neurological: Negative.    Physical Exam Updated Vital Signs BP (!) 148/89 (BP Location: Right Arm)   Pulse 80   Temp 97.8 F (36.6 C) (Oral)   Resp 18   SpO2 94%   Physical Exam Vitals and nursing note reviewed.  Constitutional:      Appearance: She is obese. She is not ill-appearing or toxic-appearing.  HENT:     Head: Normocephalic and atraumatic.     Mouth/Throat:  Mouth: Mucous membranes are moist.     Pharynx: No oropharyngeal exudate or posterior oropharyngeal erythema.  Eyes:     General:        Right eye: No discharge.        Left eye: No discharge.     Conjunctiva/sclera: Conjunctivae normal.  Cardiovascular:     Rate and Rhythm: Normal rate and regular  rhythm.     Pulses: Normal pulses.  Pulmonary:     Effort: Pulmonary effort is normal. No respiratory distress.     Breath sounds: Normal breath sounds. No wheezing or rales.  Abdominal:     General: Bowel sounds are normal. There is no distension.     Palpations: Abdomen is soft.     Tenderness: There is no abdominal tenderness.  Musculoskeletal:        General: No deformity.     Cervical back: Neck supple.     Right hip: Normal.     Left hip: Normal.     Right upper leg: Normal.     Left upper leg: Normal.     Right knee: No bony tenderness. Tenderness present over the medial joint line.     Instability Tests: Anterior drawer test negative.     Left knee: Normal.     Right lower leg: Normal.     Left lower leg: Normal.     Right ankle: Normal.     Right Achilles Tendon: Normal.     Left ankle: Normal.     Left Achilles Tendon: Normal.     Right foot: Normal.     Left foot: Normal.       Legs:     Comments: Normal DP pulses bilaterally. Patient unwilling to attempt to range the knee secondary to pain.  Does have normal plantar and dorsiflexion bilaterally though should says this elicits knee pain. Patient not willing to attempt to stand in the ED but was reportedly ambulatory prior to arrival.  Skin:    General: Skin is warm and dry.     Capillary Refill: Capillary refill takes less than 2 seconds.  Neurological:     General: No focal deficit present.     Mental Status: She is alert and oriented to person, place, and time. Mental status is at baseline.     Sensory: Sensation is intact.     Motor: No weakness.     Gait: Gait normal.     Comments: Patient unwilling to attempt to stand in the ED.  Psychiatric:        Mood and Affect: Mood normal. Affect is angry.    ED Results / Procedures / Treatments   Labs (all labs ordered are listed, but only abnormal results are displayed) Labs Reviewed  CBG MONITORING, ED - Abnormal; Notable for the following components:       Result Value   Glucose-Capillary 110 (*)    All other components within normal limits    EKG None  Radiology DG Knee Complete 4 Views Right  Result Date: 07/19/2021 CLINICAL DATA:  Right knee pain EXAM: RIGHT KNEE - COMPLETE 4+ VIEW COMPARISON:  None. FINDINGS: No fracture or dislocation is seen. Mild to moderate tricompartmental degenerative changes, most prominent in the lateral compartment. Possible mild lateral compartment chondrocalcinosis. The visualized soft tissues are unremarkable. No suprapatellar knee joint effusion. IMPRESSION: Mild to moderate degenerative changes, most prominent in the lateral compartment. No fracture or dislocation is seen. Electronically Signed   By: Henderson Newcomer.D.  On: 07/19/2021 00:33    Procedures Procedures   Medications Ordered in ED Medications  oxyCODONE-acetaminophen (PERCOCET/ROXICET) 5-325 MG per tablet 1 tablet (1 tablet Oral Given 07/19/21 0954)    ED Course  I have reviewed the triage vital signs and the nursing notes.  Pertinent labs & imaging results that were available during my care of the patient were reviewed by me and considered in my medical decision making (see chart for details).    MDM Rules/Calculators/A&P                         70 year old female who presents with concern right knee pain since yesterday. No hx of trauma.   Hypertensive on intake. VS otherwise normal. Cardiopulmonary exam is normal. Abdominal exam is benign. Patient with normal vascular status in all 4 extremities. TTP over the medial aspect of the right knee without skin changes or sign of trauma.   Plain film negative for acute fracture or dislocation.  Skin exam is reassuring, physical exam is reassuring without acute laxity.  Patient unwilling to range the knee or allow me to passively range the knee.  Patient unwilling to attempt to stand in the emergency department.  Patient with angry affect.  No further work-up warranted in the ER at this  time given reassuring physical exam and imaging.  Suspect her pain is secondary to a small effusion and degenerative changes within the knee.  Recommend continued Tylenol, ambulation with a walker, and orthopedic follow-up.  Revonda voiced understanding of her medical evaluation and treatment plan.  Her questions was answered to her expressed satisfaction.  Return precautions are given.  Patient is well-appearing, stable and appropriate for discharge at this time.  This chart was dictated using voice recognition software, Dragon. Despite the best efforts of this provider to proofread and correct errors, errors may still occur which can change documentation meaning.   Final Clinical Impression(s) / ED Diagnoses Final diagnoses:  Acute pain of right knee    Rx / DC Orders ED Discharge Orders          Ordered    oxyCODONE (ROXICODONE) 5 MG immediate release tablet  Every 4 hours PRN        07/19/21 0950             , Gypsy Balsam, PA-C 07/19/21 1607    Varney Biles, MD 07/22/21 1959

## 2021-07-21 ENCOUNTER — Ambulatory Visit: Payer: Self-pay

## 2021-07-21 ENCOUNTER — Ambulatory Visit (INDEPENDENT_AMBULATORY_CARE_PROVIDER_SITE_OTHER): Payer: 59 | Admitting: Family Medicine

## 2021-07-21 VITALS — BP 141/84 | Ht 66.0 in | Wt 330.0 lb

## 2021-07-21 DIAGNOSIS — M170 Bilateral primary osteoarthritis of knee: Secondary | ICD-10-CM

## 2021-07-21 MED ORDER — TRIAMCINOLONE ACETONIDE 40 MG/ML IJ SUSP
40.0000 mg | Freq: Once | INTRAMUSCULAR | Status: AC
  Administered 2021-07-21: 40 mg via INTRA_ARTICULAR

## 2021-07-21 NOTE — Patient Instructions (Signed)
Good to see you Happy early birthday.  Please use ice as needed   Please send me a message in MyChart with any questions or updates.  Please see me back in 4 weeks.   --Dr. Raeford Razor

## 2021-07-21 NOTE — Assessment & Plan Note (Addendum)
Acute on chronic in nature.  Exacerbation of underlying degenerative changes. Has chondral defect of the right side.  -Counseled on home exercise therapy and supportive care. -Bilateral injections today. -Could consider further imaging or physical therapy. Could consider nerve block and ablation

## 2021-07-21 NOTE — Progress Notes (Signed)
Anita James - 70 y.o. female MRN [id number]  Date of birth: July 16, 1951  SUBJECTIVE:  Including CC & ROS.  No chief complaint on file.   Anita James is a 70 y.o. female that is presenting with acute on chronic bilateral knee pain.  The pain is occurring medially.  No injury or inciting event.   Review of Systems See HPI   HISTORY: Past Medical, Surgical, Social, and Family History Reviewed & Updated per EMR.   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   Neuropathy    Obesity    Pneumonia    Vitamin D deficiency 06/05/2014    Past Surgical History:  Procedure Laterality Date   CESAREAN SECTION     COLONOSCOPY WITH PROPOFOL N/A 07/01/2021   Procedure: COLONOSCOPY WITH PROPOFOL;  Surgeon: [name], MD;  Location: [facility];  Service: Gastroenterology;  Laterality: N/A;   KNEE SURGERY Left    THYROID SURGERY     goiter    Family History  Problem Relation Age of Onset   Cancer Mother        cervical- mets   Breast cancer Mother    Cancer Father    Heart disease Father    Diabetes Father    Diabetes Sister    Breast cancer Sister    Heart disease Sister    Breast cancer Sister    Heart disease Sister    Breast cancer Sister    Breast cancer Sister    COPD Sister    Breast cancer Sister    Colon cancer Neg Hx    Esophageal cancer Neg Hx    Rectal cancer Neg Hx    Stomach cancer Neg Hx     Social History   Socioeconomic History   Marital status: Divorced    Spouse name: Not on file   Number of children: 6   Years of education: BA   Highest education  level: Not on file  Occupational History   Occupation: unemployed  Tobacco Use   Smoking status: Never   Smokeless tobacco: Never  Vaping Use   Vaping Use: Never used  Substance and Sexual Activity   Alcohol use: No   Drug use: No   Sexual activity: Not on file  Other Topics Concern   Not on file  Social History Narrative   Not on file   Social Determinants of Health   Financial Resource Strain: Not on file  Food Insecurity: Not on file  Transportation Needs: Not on file  Physical Activity: Not on file  Stress: Not on file  Social Connections: Not on file  Intimate Partner Violence: Not on file     PHYSICAL EXAM:  VS: BP (!) 141/84   Ht 5\' 6"  (1.676 m)   Wt (!) 330 lb (149.7 kg)   BMI 53.26 kg/m  Physical Exam Gen: NAD, alert, cooperative with exam, well-appearing    Aspiration/Injection Procedure Note Anita James [birth year]  Procedure: Aspiration and Injection Indications: Right knee pain  Procedure  Details Consent: Risks of procedure as well as the alternatives and risks of each were explained to the (patient/caregiver).  Consent for procedure obtained. Time Out: Verified patient identification, verified procedure, site/side was marked, verified correct patient position, special equipment/implants available, medications/allergies/relevent history reviewed, required imaging and test results available.  Performed.  The area was cleaned with iodine and alcohol swabs.    The right knee superior lateral suprapatellar pouch was injected using 3 cc of 1% lidocaine on a 21-gauge 2 inch needle.  The syringe was switched to mixture containing 1 cc's of 40 mg Kenalog and 4 cc's of 0.25% bupivacaine was injected.  Ultrasound was used. Images were obtained in long views showing the injection.    Amount of Fluid Aspirated:  49mL Character of Fluid: clear and straw colored Fluid was sent for: n/a  A sterile dressing was applied.  Patient did tolerate procedure  well.   Aspiration/Injection Procedure Note Anita James 05/02/1951  Procedure: Injection Indications: left knee pain   Procedure Details Consent: Risks of procedure as well as the alternatives and risks of each were explained to the (patient/caregiver).  Consent for procedure obtained. Time Out: Verified patient identification, verified procedure, site/side was marked, verified correct patient position, special equipment/implants available, medications/allergies/relevent history reviewed, required imaging and test results available.  Performed.  The area was cleaned with iodine and alcohol swabs.    The left knee superior lateral suprapatellar pouch was injected using 3 cc 1% lidocaine on a 21-gauge 2 inch needle.  The syringe was switched to mixture containing 1 cc's of 40 mg Kenalog and 4 cc's of 0.25% bupivacaine was injected.  Ultrasound was used. Images were obtained in long views showing the injection.     A sterile dressing was applied.  Patient did tolerate procedure well.      ASSESSMENT & PLAN:   OA (osteoarthritis) of knee Acute on chronic in nature.  Exacerbation of underlying degenerative changes. Has chondral defect of the right side.  -Counseled on home exercise therapy and supportive care. -Bilateral injections today. -Could consider further imaging or physical therapy. Could consider nerve block and ablation

## 2021-07-25 ENCOUNTER — Telehealth: Payer: Self-pay | Admitting: Family Medicine

## 2021-07-25 NOTE — Telephone Encounter (Signed)
I called pt- She states the 07/21/21 kenalog injections have not taken effect. She has been in bed and using ice. She has taken Tylenol 500 mg as directed with no pain relief.  She has no other pain meds left to try. Please advise. She is aware MD is out of the office until 07/28/21.

## 2021-07-25 NOTE — Telephone Encounter (Signed)
Pt called says her knees are still  hurting & ask did she get Injections in both knee( reviewed DOS chart advised her Yes she was given injections in both knee).  --Patient still ask for provider or med staff to call her back @ [phone].  --Forwarding message to med asst.  -glh

## 2021-07-28 ENCOUNTER — Telehealth: Payer: 59 | Admitting: Family Medicine

## 2021-07-28 NOTE — Telephone Encounter (Signed)
07/28/21 virtual visit was scheduled for 1:10. MD tried to contact patient x 2 and was unable to reach her.

## 2021-08-12 ENCOUNTER — Telehealth (INDEPENDENT_AMBULATORY_CARE_PROVIDER_SITE_OTHER): Payer: 59 | Admitting: Family Medicine

## 2021-08-12 DIAGNOSIS — M1711 Unilateral primary osteoarthritis, right knee: Secondary | ICD-10-CM

## 2021-08-12 MED ORDER — HYDROCODONE-ACETAMINOPHEN 5-325 MG PO TABS
1.0000 | ORAL_TABLET | Freq: Three times a day (TID) | ORAL | 0 refills | Status: DC | PRN
Start: 2021-08-12 — End: 2021-08-19

## 2021-08-12 NOTE — Assessment & Plan Note (Signed)
Acute on chronic in nature.  Limited improvement with recent steroid injection.  Pain is occurring over the medial compartment -Counseled on home exercise therapy and supportive care. -Norco. -Referral to physical therapy. -Could consider Toradol intra-articular injection.

## 2021-08-12 NOTE — Progress Notes (Signed)
Virtual Visit via Video Note  I connected with Indea Dearman on 08/12/21 at  1:30 PM EST by a video enabled telemedicine application and verified that I am speaking with the correct person using two identifiers.  Location: Patient: vehicle  Provider: office   I discussed the limitations of evaluation and management by telemedicine and the availability of in person appointments. The patient expressed understanding and agreed to proceed.  History of Present Illness:  Ms. Auth is a 70 year old female that is presenting with worsening of her bilateral knee pain.  Recent injections have helped some but the pain has acutely gotten worse.  She is having pain with any form of weightbearing and ambulation.  Occurring over the medial compartment  Observations/Objective:   Assessment and Plan:  Osteoarthritis of right and left knee: Acute on chronic in nature.  Limited improvement with recent steroid injection.  Pain is occurring over the medial compartment -Counseled on home exercise therapy and supportive care. -Norco. -Referral to physical therapy. -Could consider Toradol intra-articular injection.  Follow Up Instructions:    I discussed the assessment and treatment plan with the patient. The patient was provided an opportunity to ask questions and all were answered. The patient agreed with the plan and demonstrated an understanding of the instructions.   The patient was advised to call back or seek an in-person evaluation if the symptoms worsen or if the condition fails to improve as anticipated.    Clearance Coots, MD

## 2021-08-19 ENCOUNTER — Encounter: Payer: Self-pay | Admitting: Family Medicine

## 2021-08-19 ENCOUNTER — Telehealth: Payer: Self-pay | Admitting: Family Medicine

## 2021-08-19 ENCOUNTER — Ambulatory Visit (HOSPITAL_BASED_OUTPATIENT_CLINIC_OR_DEPARTMENT_OTHER)
Admission: RE | Admit: 2021-08-19 | Discharge: 2021-08-19 | Disposition: A | Discharge status: Home / Self Care | Payer: 59 | Source: Ambulatory Visit | Attending: Family Medicine | Admitting: Family Medicine

## 2021-08-19 ENCOUNTER — Other Ambulatory Visit: Payer: Self-pay

## 2021-08-19 ENCOUNTER — Ambulatory Visit (INDEPENDENT_AMBULATORY_CARE_PROVIDER_SITE_OTHER): Payer: 59 | Admitting: Family Medicine

## 2021-08-19 ENCOUNTER — Ambulatory Visit: Payer: Self-pay

## 2021-08-19 VITALS — BP 138/80 | Ht 66.0 in | Wt 330.0 lb

## 2021-08-19 DIAGNOSIS — M170 Bilateral primary osteoarthritis of knee: Secondary | ICD-10-CM | POA: Diagnosis present

## 2021-08-19 DIAGNOSIS — M1711 Unilateral primary osteoarthritis, right knee: Secondary | ICD-10-CM

## 2021-08-19 MED ORDER — KETOROLAC TROMETHAMINE 30 MG/ML IJ SOLN
30.0000 mg | Freq: Once | INTRAMUSCULAR | Status: AC
  Administered 2021-08-19: 30 mg via INTRA_ARTICULAR

## 2021-08-19 MED ORDER — HYDROCODONE-ACETAMINOPHEN 5-325 MG PO TABS: 1.0000 | ORAL_TABLET | Freq: Three times a day (TID) | ORAL | 0 refills | Status: DC | PRN

## 2021-08-19 NOTE — Patient Instructions (Signed)
Good to see you Please try ice as needed  I will call with the xray results.   Please send me a message in MyChart with any questions or updates.  Please see me back in 4 weeks.   --Dr. Raeford Razor

## 2021-08-19 NOTE — Assessment & Plan Note (Signed)
Acute on chronic in nature.  Significant pain with ambulation. -Counseled on home exercise therapy and supportive care. -Left knee x-ray. -Zilretta injection on the right knee. -Intra-articular Toradol in the left. -Right knee medial unloader brace due to thigh to calf ratio. - refilled norco

## 2021-08-19 NOTE — Telephone Encounter (Signed)
Pt cld after hrs 11/28  seeking Appt  w/ Dr.Schmitz . --Pt cld back but did NOT answer--No VMB to leave msg (phone just rang& rang)  --Cld pt's daughter/Carmen's listed ph# left msg for pt with her regarding Appt request & availability.  --glh

## 2021-08-19 NOTE — Progress Notes (Signed)
Anita James - 70 y.o. female MRN [id number]  Date of birth: Aug 13, 1951  SUBJECTIVE:  Including CC & ROS.  No chief complaint on file.   Anita James is a 70 y.o. female that is presenting with acute worsening of her right knee pain.  Having pain with ambulation.  Pain is occurring over the medial compartment of the right and left knee.  No injury or inciting event.   Review of Systems See HPI   HISTORY: Past Medical, Surgical, Social, and Family History Reviewed & Updated per EMR.   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   Neuropathy    Obesity    Pneumonia    Vitamin D deficiency 06/05/2014    Past Surgical History:  Procedure Laterality Date   CESAREAN SECTION     COLONOSCOPY WITH PROPOFOL N/A 07/01/2021   Procedure: COLONOSCOPY WITH PROPOFOL;  Surgeon: [name], MD;  Location: [facility];  Service: Gastroenterology;  Laterality: N/A;   KNEE SURGERY Left    THYROID SURGERY     goiter    Family History  Problem Relation Age of Onset   Cancer Mother        cervical- mets   Breast cancer Mother    Cancer Father    Heart disease Father    Diabetes Father    Diabetes Sister    Breast cancer Sister    Heart disease Sister    Breast cancer Sister    Heart disease Sister    Breast cancer Sister    Breast cancer Sister    COPD Sister    Breast cancer Sister    Colon cancer Neg Hx    Esophageal cancer Neg Hx    Rectal cancer Neg Hx    Stomach cancer Neg Hx     Social History   Socioeconomic History   Marital status: Divorced    Spouse name: Not on file    Number of children: 6   Years of education: BA   Highest education level: Not on file  Occupational History   Occupation: unemployed  Tobacco Use   Smoking status: Never   Smokeless tobacco: Never  Vaping Use   Vaping Use: Never used  Substance and Sexual Activity   Alcohol use: No   Drug use: No   Sexual activity: Not on file  Other Topics Concern   Not on file  Social History Narrative   Not on file   Social Determinants of Health   Financial Resource Strain: Not on file  Food Insecurity: Not on file  Transportation Needs: Not on file  Physical Activity: Not on file  Stress: Not on file  Social Connections: Not on file  Intimate Partner Violence: Not on file     PHYSICAL EXAM:  VS: BP 138/80 (BP Location: Left Arm, Patient Position: Sitting)   Ht 5\' 6"  (1.676 m)   Wt (!) 330 lb (149.7 kg)   BMI 53.26 kg/m  Physical Exam Gen: NAD, alert, cooperative with exam, well-appearing  MSK:  Right knee: Tenderness to palpation over the medial joint space. Limited flexion and extension. Instability with valgus and varus stress testing. Neurovascularly intact   Aspiration/Injection Procedure Note Anita James Jan 25, 1951  Procedure: Injection Indications: Left knee pain  Procedure Details Consent: Risks of procedure as well as the alternatives and risks of each were explained to the (patient/caregiver).  Consent for procedure obtained. Time Out: Verified patient identification, verified procedure, site/side was marked, verified correct patient position, special equipment/implants available, medications/allergies/relevent history reviewed, required imaging and test results available.  Performed.  The area was cleaned with iodine and alcohol swabs.    The left knee superior lateral suprapatellar pouch was injected using 3 cc of 1% lidocaine on a 21-gauge 1-1/2 inch needle.  The syringe was switched and a mixture containing 1 cc's of 30 mg Toradol and 4 cc's of 0.25%  bupivacaine was injected.  Ultrasound was used. Images were obtained in long views showing the injection.     A sterile dressing was applied.  Patient did tolerate procedure well.   Aspiration/Injection Procedure Note Anita James [birth year]  Procedure: Injection Indications: Right knee pain  Procedure Details Consent: Risks of procedure as well as the alternatives and risks of each were explained to the (patient/caregiver).  Consent for procedure obtained. Time Out: Verified patient identification, verified procedure, site/side was marked, verified correct patient position, special equipment/implants available, medications/allergies/relevent history reviewed, required imaging and test results available.  Performed.  The area was cleaned with iodine and alcohol swabs.    The right knee superior lateral suprapatellar pouch was injected using 3 cc of 1% lidocaine on a 21-gauge 2 inch needle. The syringe was switched and a mixture containing 5 cc's of 32 mg Zilretta and 4 cc's of 0.25% bupivacaine was injected.  Ultrasound was used. Images were obtained in long views showing the injection.    A sterile dressing was applied.  Patient did tolerate procedure well.     ASSESSMENT & PLAN:   OA (osteoarthritis) of knee Acute on chronic in nature.  Significant pain with ambulation. -Counseled on home exercise therapy and supportive care. -Left knee x-ray. -Zilretta injection on the right knee. -Intra-articular Toradol in the left. -Right knee medial unloader brace due to thigh to calf ratio. - refilled norco

## 2021-08-22 ENCOUNTER — Telehealth: Payer: Self-pay | Admitting: Family Medicine

## 2021-08-22 NOTE — Telephone Encounter (Signed)
Informed of results.   Rosemarie Ax, MD [facility] 08/22/2021, 12:19 PM

## 2021-09-18 ENCOUNTER — Ambulatory Visit: Payer: 59 | Admitting: Family Medicine

## 2022-06-19 IMAGING — CR DG KNEE COMPLETE 4+V*R*
4 series · 4 of 4 positions shown · non-contrast
Comparison: None.

CLINICAL DATA: Right knee pain

EXAM:
RIGHT KNEE - COMPLETE 4+ VIEW

[t knee ap right]
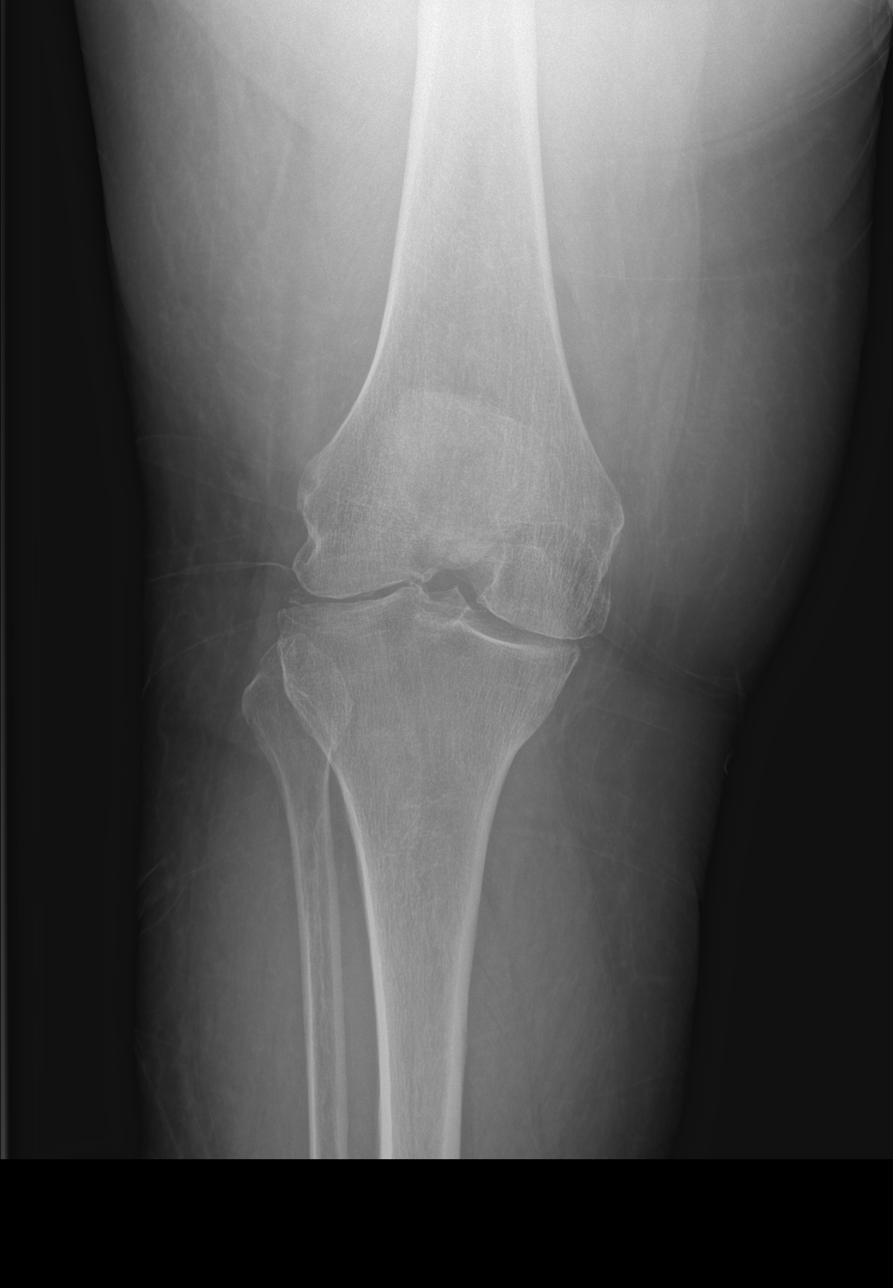

[t knee obl right (1 of 2)]
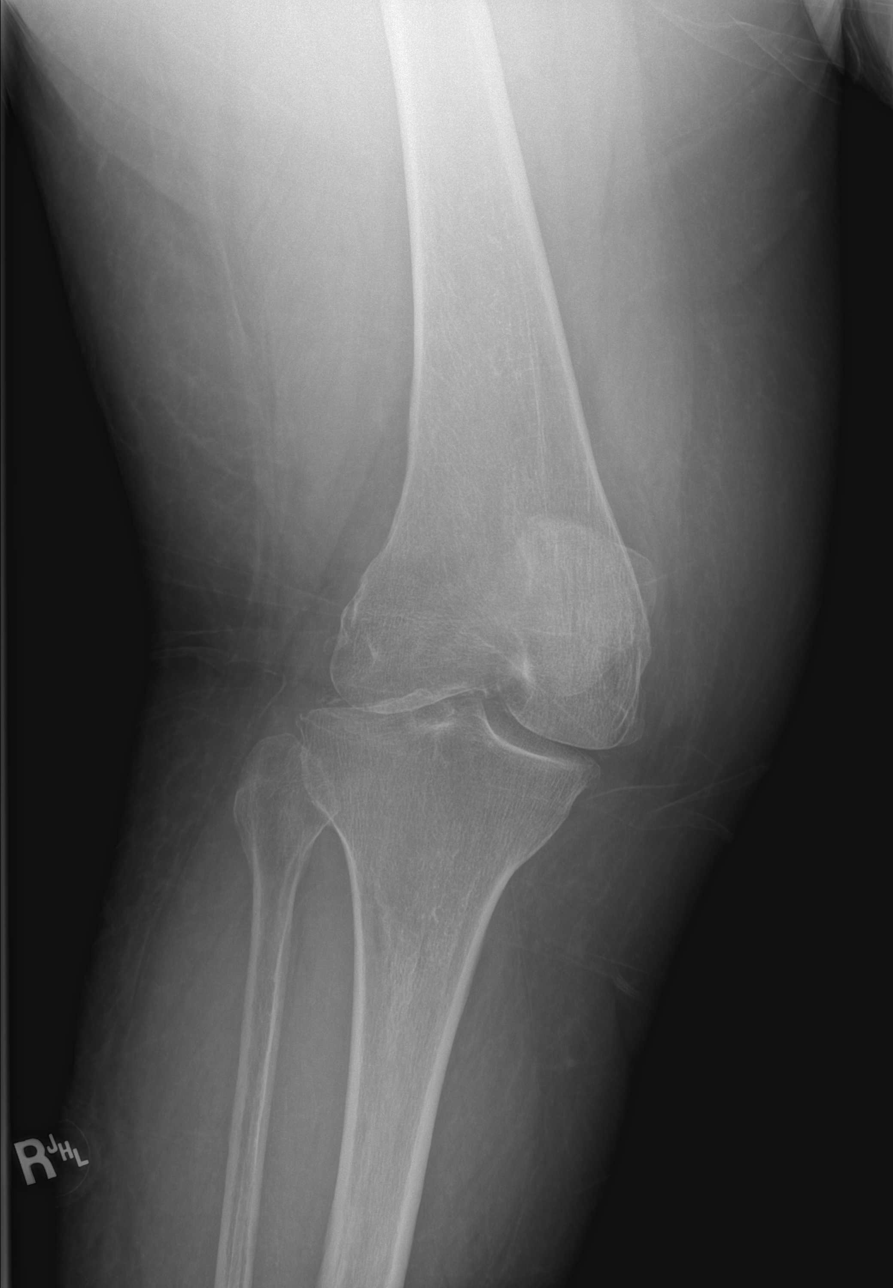

[t knee obl right (2 of 2)]
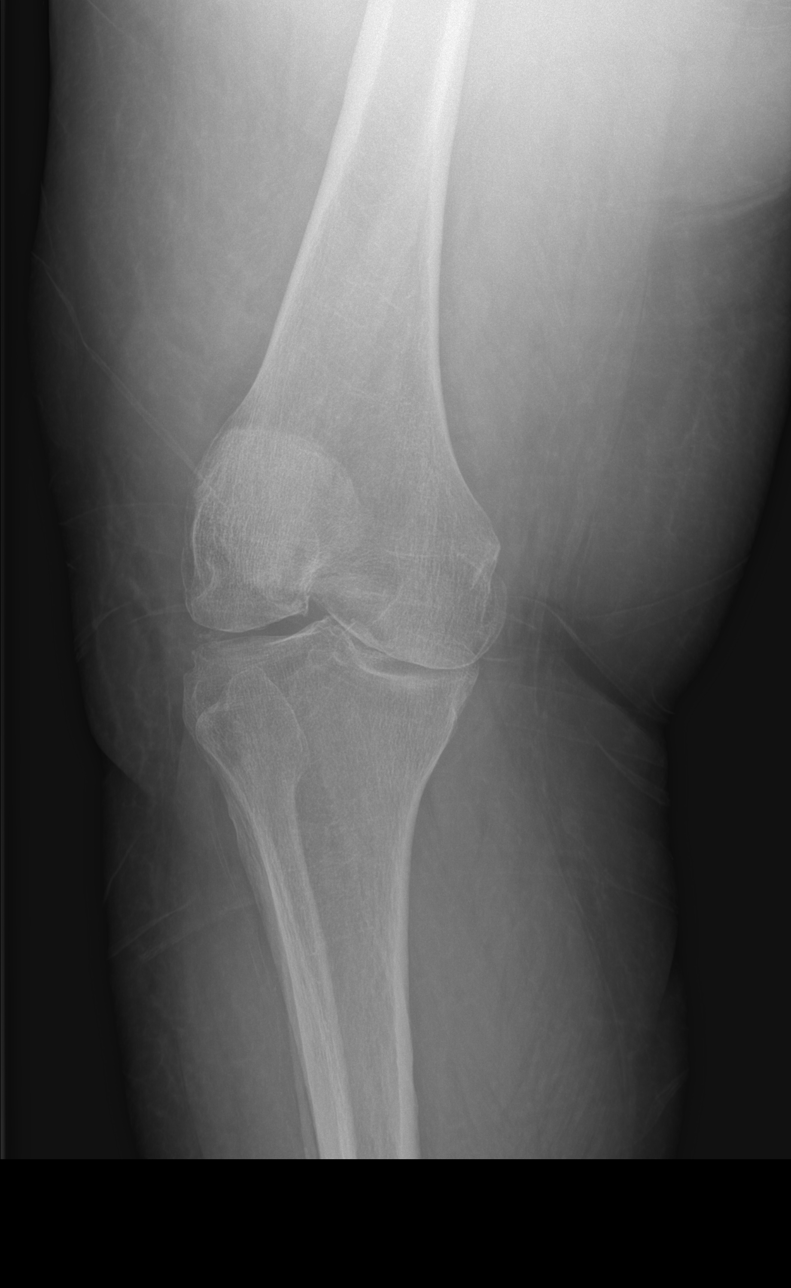

[x knee lat right]
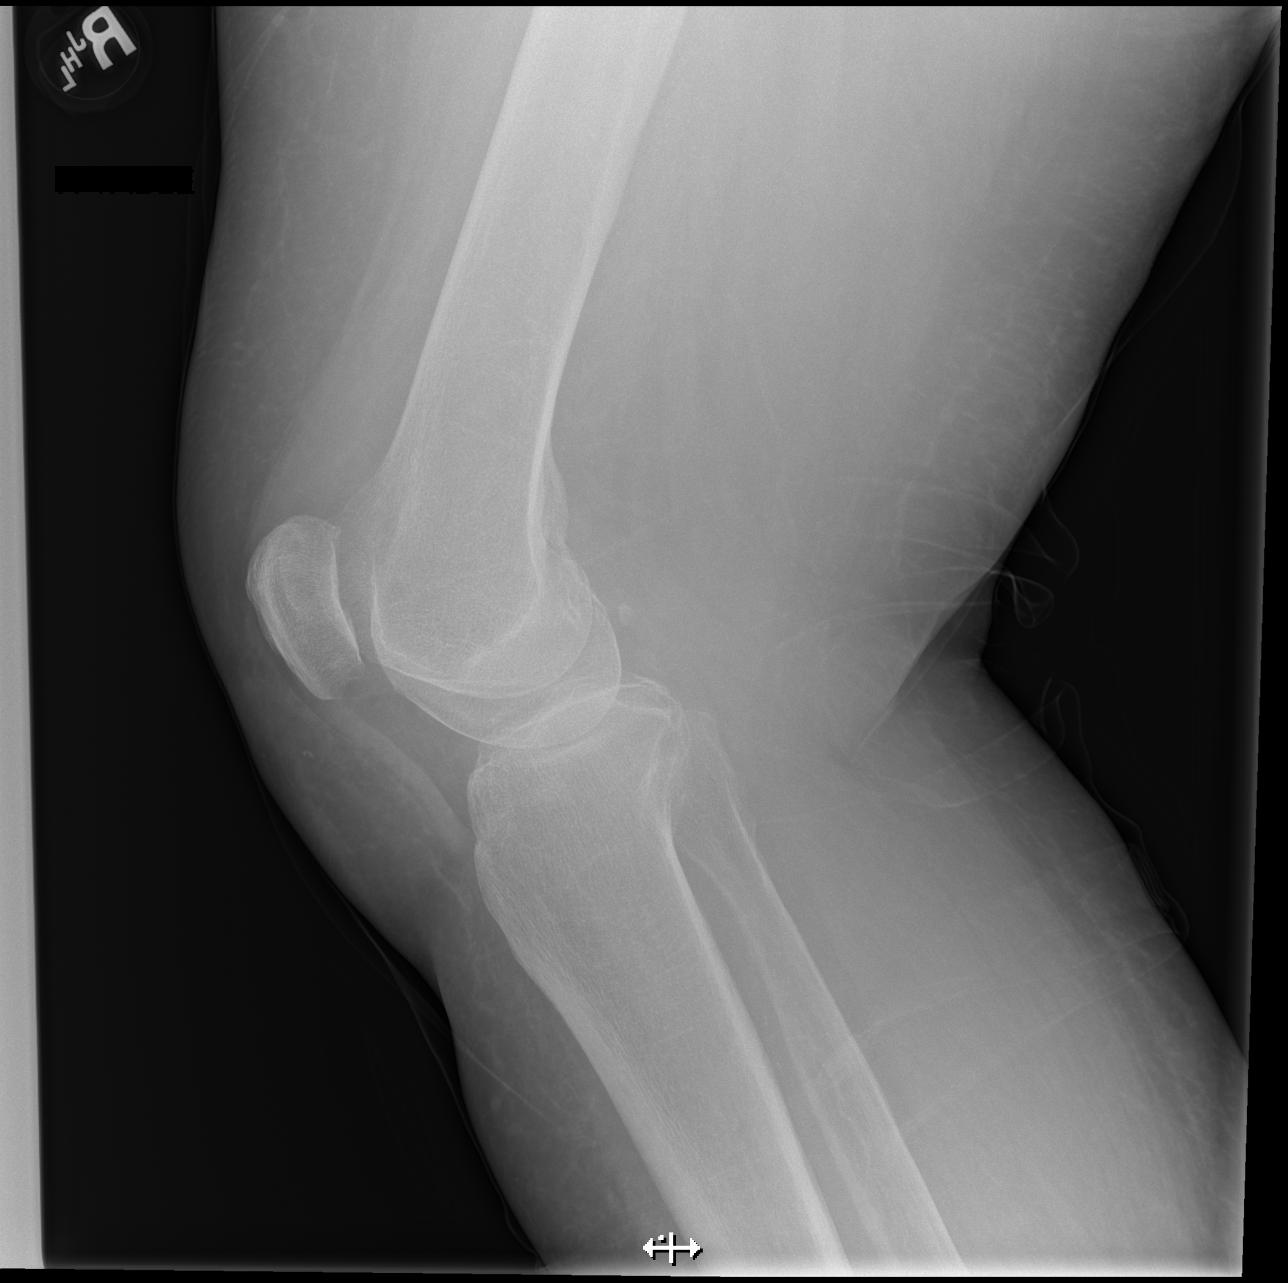

[4 of 4 positions shown; findings below may reference images not displayed]

FINDINGS: No fracture or dislocation is seen.

Mild to moderate tricompartmental degenerative changes, most
prominent in the lateral compartment. Possible mild lateral
compartment chondrocalcinosis.

The visualized soft tissues are unremarkable.

No suprapatellar knee joint effusion.
IMPRESSION: Mild to moderate degenerative changes, most prominent in the lateral
compartment.

No fracture or dislocation is seen.

## 2022-07-21 ENCOUNTER — Emergency Department (HOSPITAL_COMMUNITY)
Admission: EM | Admit: 2022-07-21 | Discharge: 2022-07-22 | Disposition: A | Discharge status: Home / Self Care | Payer: 59 | Attending: Student | Admitting: Student

## 2022-07-21 ENCOUNTER — Encounter (HOSPITAL_COMMUNITY): Payer: Self-pay

## 2022-07-21 DIAGNOSIS — Z7984 Long term (current) use of oral hypoglycemic drugs: Secondary | ICD-10-CM | POA: Insufficient documentation

## 2022-07-21 DIAGNOSIS — R944 Abnormal results of kidney function studies: Secondary | ICD-10-CM | POA: Insufficient documentation

## 2022-07-21 DIAGNOSIS — R079 Chest pain, unspecified: Secondary | ICD-10-CM | POA: Diagnosis not present

## 2022-07-21 DIAGNOSIS — E1122 Type 2 diabetes mellitus with diabetic chronic kidney disease: Secondary | ICD-10-CM | POA: Insufficient documentation

## 2022-07-21 DIAGNOSIS — N189 Chronic kidney disease, unspecified: Secondary | ICD-10-CM | POA: Diagnosis not present

## 2022-07-21 DIAGNOSIS — R2243 Localized swelling, mass and lump, lower limb, bilateral: Secondary | ICD-10-CM | POA: Insufficient documentation

## 2022-07-21 DIAGNOSIS — E039 Hypothyroidism, unspecified: Secondary | ICD-10-CM | POA: Insufficient documentation

## 2022-07-21 DIAGNOSIS — I509 Heart failure, unspecified: Secondary | ICD-10-CM | POA: Insufficient documentation

## 2022-07-21 DIAGNOSIS — I130 Hypertensive heart and chronic kidney disease with heart failure and stage 1 through stage 4 chronic kidney disease, or unspecified chronic kidney disease: Secondary | ICD-10-CM | POA: Diagnosis not present

## 2022-07-21 DIAGNOSIS — R1011 Right upper quadrant pain: Secondary | ICD-10-CM | POA: Insufficient documentation

## 2022-07-21 DIAGNOSIS — Z794 Long term (current) use of insulin: Secondary | ICD-10-CM | POA: Insufficient documentation

## 2022-07-21 DIAGNOSIS — Z79899 Other long term (current) drug therapy: Secondary | ICD-10-CM | POA: Diagnosis not present

## 2022-07-21 DIAGNOSIS — R0602 Shortness of breath: Secondary | ICD-10-CM | POA: Diagnosis not present

## 2022-07-21 DIAGNOSIS — Z7982 Long term (current) use of aspirin: Secondary | ICD-10-CM | POA: Insufficient documentation

## 2022-07-21 LAB — COMPREHENSIVE METABOLIC PANEL
ALT: 14 U/L (ref 0–44)
AST: 17 U/L (ref 15–41)
Albumin: 3.6 g/dL (ref 3.5–5.0)
Alkaline Phosphatase: 52 U/L (ref 38–126)
Anion gap: 14 (ref 5–15)
BUN: 24 mg/dL — ABNORMAL HIGH (ref 8–23)
CO2: 29 mmol/L (ref 22–32)
Calcium: 9.5 mg/dL (ref 8.9–10.3)
Chloride: 99 mmol/L (ref 98–111)
Creatinine, Ser: 1.28 mg/dL — ABNORMAL HIGH (ref 0.44–1.00)
GFR, Estimated: 46 mL/min — ABNORMAL LOW (ref >60)
Glucose, Bld: 238 mg/dL — ABNORMAL HIGH (ref 70–99)
Potassium: 3.6 mmol/L (ref 3.5–5.1)
Sodium: 142 mmol/L (ref 135–145)
Total Bilirubin: 0.6 mg/dL (ref 0.3–1.2)
Total Protein: 6.9 g/dL (ref 6.5–8.1)

## 2022-07-21 LAB — CBC WITH DIFFERENTIAL/PLATELET
Abs Immature Granulocytes: 0.02 10*3/uL (ref 0.00–0.07)
Basophils Absolute: 0 10*3/uL (ref 0.0–0.1)
Basophils Relative: 0 %
Eosinophils Absolute: 0.1 10*3/uL (ref 0.0–0.5)
Eosinophils Relative: 1 %
HCT: 40.4 % (ref 36.0–46.0)
Hemoglobin: 13.1 g/dL (ref 12.0–15.0)
Immature Granulocytes: 0 %
Lymphocytes Relative: 22 %
Lymphs Abs: 1.5 10*3/uL (ref 0.7–4.0)
MCH: 30 pg (ref 26.0–34.0)
MCHC: 32.4 g/dL (ref 30.0–36.0)
MCV: 92.7 fL (ref 80.0–100.0)
Monocytes Absolute: 0.7 10*3/uL (ref 0.1–1.0)
Monocytes Relative: 10 %
Neutro Abs: 4.4 10*3/uL (ref 1.7–7.7)
Neutrophils Relative %: 67 %
Platelets: 193 10*3/uL (ref 150–400)
RBC: 4.36 MIL/uL (ref 3.87–5.11)
RDW: 12.7 % (ref 11.5–15.5)
WBC: 6.6 10*3/uL (ref 4.0–10.5)
nRBC: 0 % (ref 0.0–0.2)

## 2022-07-21 LAB — TROPONIN I (HIGH SENSITIVITY): Troponin I (High Sensitivity): 3 ng/L (ref <18)

## 2022-07-21 MED ORDER — MORPHINE SULFATE (PF) 4 MG/ML IV SOLN
4.0000 mg | Freq: Once | INTRAVENOUS | Status: AC
  Administered 2022-07-22: 4 mg via INTRAVENOUS
  Filled 2022-07-21: qty 1

## 2022-07-21 MED ORDER — ONDANSETRON HCL 4 MG/2ML IJ SOLN
4.0000 mg | Freq: Once | INTRAMUSCULAR | Status: AC
  Administered 2022-07-22: 4 mg via INTRAVENOUS
  Filled 2022-07-21: qty 2

## 2022-07-21 NOTE — ED Triage Notes (Signed)
BIBA from home, c/o CP radiating to L shoulder since 2000 tonight. Patient c/o SOB and BLE swelling since tonight. States she took fluid pill then CP started. hx of CKD 3 (not on dialysis)  EMS VS: 178/78 88HR NSR 96% on 2L '324mg'$  ASA given en route

## 2022-07-21 NOTE — ED Provider Notes (Signed)
[facility] EMERGENCY DEPARTMENT Provider Note  CSN: [id number] Arrival date & time: 07/21/22 2237  Chief Complaint(s) Chest Pain  HPI Anita James is a 71 y.o. female with PMH CHF, CKD, T2DM, HTN, HLD, hypothyroidism who presents emergency department for evaluation of chest pain, abdominal pain and shortness of breath.  She states that starting at 8 PM tonight she had acute onset right upper quadrant, epigastric pain that radiated up into the chest and down the left shoulder.  Endorses shortness of breath and bilateral lower extremity swelling worse over the last 48 hours.  She states that she took her torsemide in the hopes that it would improve her lower extremity edema but has not noticed any major relief.  She states that she has a known hernia in her abdomen and the pain did come on after eating today.  Denies diaphoresis, headache, vomiting, diarrhea or other systemic symptoms.   Past Medical History Past Medical History:  Diagnosis Date   Anxiety    Carpal tunnel syndrome 06/14/2014   Chest pain    CHF (congestive heart failure) (HCC)    Chronic back pain    Chronic kidney disease 03/2013   failure due to meds   Diabetes mellitus type II, uncontrolled    Diverticulitis    Frequent headaches    Hiatal hernia    Hyperlipemia    Hypertension    Hypothyroidism    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   Neuropathy    Obesity    Pneumonia    Vitamin D deficiency 06/05/2014   Patient Active Problem List   Diagnosis Date Noted   Osteopenia of neck of left femur 05/09/2020   Chondrocalcinosis 04/29/2020   OA (osteoarthritis) of knee 04/15/2020   Right hand pain 04/15/2020   OSA on CPAP 05/19/2017   Morbid obesity due to excess calories ([place]) 03/17/2017   Neuropathic pain 11/04/2016   Carpal tunnel syndrome 06/14/2014   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   Cervical radiculopathy at C8 06/05/2014   Hyperlipemia 40/06/2724   Diastolic CHF ([place]) 36/64/4034   Major  depression 09/12/2013   Allergic rhinitis 06/22/2013   Chronic pain syndrome 05/31/2013   Gastroesophageal reflux disease without esophagitis 08/30/2012   Essential hypertension    Hypothyroidism    Type 2 diabetes, uncontrolled, with neuropathy    Home Medication(s) Prior to Admission medications   Medication Sig Start Date End Date Taking? Authorizing Provider  Accu-Chek FastClix Lancets MISC USE AS DIRECTED TO CHECK BLOOD SUGAR THREE TIMES DAILY 11/07/19   Ladell Pier, MD  ACCU-CHEK GUIDE test strip USE AS DIRECTED TO CHECK BLOOD SUGAR THREE TIMES DAILY 10/04/19   Ladell Pier, MD  albuterol (ACCUNEB) 0.63 MG/3ML nebulizer solution Take 1 ampule by nebulization 3 (three) times daily as needed for wheezing or shortness of breath. 01/07/21   [provider]  aspirin EC 81 MG tablet Take 81 mg by mouth daily. 11/20/16   [provider]  Blood Glucose Monitoring Suppl (ACCU-CHEK AVIVA PLUS) w/Device KIT USE AS DIRECTED TO TEST TWICE DAILY 04/27/18   Ladell Pier, MD  carvedilol (COREG) 12.5 MG tablet Take 1.5 tablets (18.75 mg total) by mouth 2 (two) times daily with a meal. 10/13/19   Ladell Pier, MD  cetirizine (ZYRTEC) 10 MG tablet Take 10 mg by mouth daily as needed for allergies.    [provider]  DEXILANT 60 MG capsule TAKE ONE CAPSULE BY MOUTH DAILY Patient taking differently: Take 60 mg  by mouth daily. 01/15/20   Ladell Pier, MD  diclofenac sodium (VOLTAREN) 1 % GEL Apply 4 g topically 4 (four) times daily. Patient taking differently: Apply 4 g topically daily as needed (pain). 02/27/19   Deno Etienne, DO  ezetimibe (ZETIA) 10 MG tablet Take 10 mg by mouth every evening. 02/05/20   [provider]  FARXIGA 10 MG TABS tablet Take 10 mg by mouth daily. 03/16/20   [provider]  fluticasone (FLONASE) 50 MCG/ACT nasal spray PLACE 2 SPRAYS IN EACH NOSTRIL DAILY AS NEEDED FOR ALLERGIES 07/21/19   Ladell Pier, MD   gabapentin (NEURONTIN) 300 MG capsule TAKE 2 CAPSULES BY MOUTH TWICE DAILY 08/01/20   Ladell Pier, MD  HYDROcodone-acetaminophen (NORCO/VICODIN) 5-325 MG tablet Take 1 tablet by mouth every 8 (eight) hours as needed. 08/19/21   Rosemarie Ax, MD  insulin aspart (NOVOLOG FLEXPEN) 100 UNIT/ML FlexPen Inject 60 Units into the skin 3 (three) times daily with meals. Patient taking differently: Inject 65 Units into the skin 3 (three) times daily with meals. 12/05/19   Ladell Pier, MD  Insulin Glargine (LANTUS SOLOSTAR) 100 UNIT/ML Solostar Pen Inject 84 Units into the skin daily. Patient taking differently: Inject 86 Units into the skin daily. 10/13/19   Ladell Pier, MD  Insulin Pen Needle (TRUEPLUS PEN NEEDLES) 32G X 4 MM MISC Use to inject insulin. 10/13/18   Ladell Pier, MD  levothyroxine (SYNTHROID) 100 MCG tablet Take 1 tablet (100 mcg total) by mouth daily. 10/13/19   Ladell Pier, MD  lidocaine (LIDODERM) 5 % Place 1 patch onto the skin every 12 (twelve) hours. Remove & Discard patch within 12 hours or as directed by MD 07/04/21   Redwine, Jniya Madara A, PA-C  lisinopril (ZESTRIL) 40 MG tablet TAKE 1 TABLET BY MOUTH DAILY 11/10/19   Ladell Pier, MD  LORazepam (ATIVAN) 0.5 MG tablet Take 0.5 mg by mouth 2 (two) times daily as needed for anxiety. 12/17/20   [provider]  metroNIDAZOLE (FLAGYL) 500 MG tablet Take 1 tablet (500 mg total) by mouth 2 (two) times daily. Patient not taking: No sig reported 03/04/21   Lacretia Leigh, MD  mometasone (NASONEX) 50 MCG/ACT nasal spray Place 2 sprays into the nose daily. Patient taking differently: Place 2 sprays into the nose daily as needed (allergies). 08/24/17   Ladell Pier, MD  NOVOFINE 32G X 6 MM MISC SMARTSIG:1 SUB-Q 4-5 Times Daily 01/22/20   [provider]  NYSTATIN powder APPLY TOPICALLY 4 TIMES DAILY. Patient taking differently: Apply 1 Bottle topically 4 (four) times daily as needed  (rash). 01/18/17   Langeland, Leda Quail, MD  ondansetron (ZOFRAN) 4 MG tablet Take 1 tablet (4 mg total) by mouth every 6 (six) hours. Patient taking differently: Take 4 mg by mouth every 6 (six) hours as needed for nausea or vomiting. 09/16/18   Darlin Drop P, PA-C  pioglitazone (ACTOS) 30 MG tablet Take 30 mg by mouth daily. 01/07/21   [provider]  predniSONE (DELTASONE) 5 MG tablet Take 6 pills for first day, 5 pills second day, 4 pills third day, 3 pills fourth day, 2 pills the fifth day, and 1 pill sixth day. Patient not taking: No sig reported 04/15/20   Rosemarie Ax, MD  Semaglutide,0.25 or 0.5MG/DOS, (OZEMPIC, 0.25 OR 0.5 MG/DOSE,) 2 MG/1.5ML SOPN Inject 0.25 mg into the skin every Friday.    [provider]  torsemide (DEMADEX) 20 MG tablet Take  20 mg by mouth daily as needed (fluid). 05/09/18   [provider]  triamcinolone ointment (KENALOG) 0.1 % Apply 1 application topically 2 (two) times daily. To affected areas Patient taking differently: Apply 1 application topically daily. To affected areas 12/31/20   Rosemarie Ax, MD  valACYclovir (VALTREX) 1000 MG tablet Take 1 tablet (1,000 mg total) by mouth 2 (two) times daily. Patient not taking: No sig reported 05/09/19   Charlott Rakes, MD  Vitamin D, Ergocalciferol, (DRISDOL) 1.25 MG (50000 UNIT) CAPS capsule Take 50,000 Units by mouth every Wednesday.    [provider]                                                                                                                                    Past Surgical History Past Surgical History:  Procedure Laterality Date   CESAREAN SECTION     COLONOSCOPY WITH PROPOFOL N/A 07/01/2021   Procedure: COLONOSCOPY WITH PROPOFOL;  Surgeon: [name], MD;  Location: [facility];  Service: Gastroenterology;  Laterality: N/A;   KNEE SURGERY Left    THYROID SURGERY     goiter   Family History Family History  Problem Relation Age of Onset    Cancer Mother        cervical- mets   Breast cancer Mother    Cancer Father    Heart disease Father    Diabetes Father    Diabetes Sister    Breast cancer Sister    Heart disease Sister    Breast cancer Sister    Heart disease Sister    Breast cancer Sister    Breast cancer Sister    COPD Sister    Breast cancer Sister    Colon cancer Neg Hx    Esophageal cancer Neg Hx    Rectal cancer Neg Hx    Stomach cancer Neg Hx     Social History Social History   Tobacco Use   Smoking status: Never   Smokeless tobacco: Never  Vaping Use   Vaping Use: Never used  Substance Use Topics   Alcohol use: No   Drug use: No   Allergies Amoxicillin-pot clavulanate, Hydrocodone-acetaminophen, Oxycodone, Sulfa antibiotics, Norvasc [amlodipine besylate], Sulfamethoxazole, and Tramadol  Review of Systems Review of Systems  Respiratory:  Positive for shortness of breath.   Cardiovascular:  Positive for chest pain.  Gastrointestinal:  Positive for abdominal pain.    Physical Exam Vital Signs  I have reviewed the triage vital signs BP (!) 152/66 (BP Location: Right Arm)   Pulse 92   Temp 97.9 F (36.6 C) (Oral)   Resp 19   Ht _0  (1.676 m)   Wt 136.1 kg   SpO2 99%   BMI 48.42 kg/m   Physical Exam Vitals and nursing note reviewed.  Constitutional:      General: She is not in acute distress.    Appearance: She is well-developed. She  is ill-appearing.  HENT:     Head: Normocephalic and atraumatic.  Eyes:     Conjunctiva/sclera: Conjunctivae normal.  Cardiovascular:     Rate and Rhythm: Normal rate and regular rhythm.     Heart sounds: No murmur heard. Pulmonary:     Effort: Pulmonary effort is normal. No respiratory distress.     Breath sounds: Normal breath sounds.  Abdominal:     Palpations: Abdomen is soft.     Tenderness: There is abdominal tenderness.  Musculoskeletal:        General: No swelling.     Cervical back: Neck supple.  Skin:    General: Skin is warm  and dry.     Capillary Refill: Capillary refill takes less than 2 seconds.  Neurological:     Mental Status: She is alert.  Psychiatric:        Mood and Affect: Mood normal.     ED Results and Treatments Labs (all labs ordered are listed, but only abnormal results are displayed) Labs Reviewed  COMPREHENSIVE METABOLIC PANEL - Abnormal; Notable for the following components:      Result Value   Glucose, Bld 238 (*)    BUN 24 (*)    Creatinine, Ser 1.28 (*)    GFR, Estimated 46 (*)    All other components within normal limits  CBC WITH DIFFERENTIAL/PLATELET  BRAIN NATRIURETIC PEPTIDE  LIPASE, BLOOD  URINALYSIS, ROUTINE W REFLEX MICROSCOPIC  TROPONIN I (HIGH SENSITIVITY)                                                                                                                          Radiology No results found.  Pertinent labs & imaging results that were available during my care of the patient were reviewed by me and considered in my medical decision making (see MDM for details).  Medications Ordered in ED Medications  morphine (PF) 4 MG/ML injection 4 mg (has no administration in time range)  ondansetron (ZOFRAN) injection 4 mg (has no administration in time range)                                                                                                                                     Procedures Procedures  (including critical care time)  Medical Decision Making / ED Course   This patient presents to the ED for concern of chest pain, this involves  an extensive number of treatment options, and is a complaint that carries with it a high risk of complications and morbidity.  The differential diagnosis includes ACS, PE, pneumonia, costochondritis, GERD  MDM: Patient seen emergency room for evaluation of chest pain.  Physical exam reveals right upper quadrant, epigastric tenderness to palpation as well as reproducible chest wall tenderness on the left.   Laboratory evaluation with a mild creatinine elevation to 1.28, BUN 24 which appears to be near baseline for the patient.  Laboratory evaluation otherwise unremarkable including negative troponin and delta troponin.  Chest x-ray with cardiomegaly and possible pulmonary vascular congestion but BNP is normal.  Follow-up CT imaging including CT PE and CT abdomen pelvis is reassuringly negative for any acute pathology.  Patient given a GI cocktail given her history of chest pain starting after eating which unfortunately did not relieve her symptoms.  She then received Lidoderm patches over the chest wall which improved her symptoms minimally.  On my second reevaluation, patient states that her daughter was on her way and she would like to be discharged.  I had a significant conversation with the patient about her symptoms and as they have not completely resolved we need to be very vigilant about these.  If she is to be discharged she needs to follow-up with her outpatient cardiologist urgently this week for follow-up.  She voiced understanding of this and was discharged in the care of her daughter with urgent outpatient cardiology follow-up and strict return precautions of which she voiced understanding.   Additional history obtained: -Additional history obtained from patient's daughter -External records from outside source obtained and reviewed including: Chart review including previous notes, labs, imaging, consultation notes   Lab Tests: -I ordered, reviewed, and interpreted labs.   The pertinent results include:   Labs Reviewed  COMPREHENSIVE METABOLIC PANEL - Abnormal; Notable for the following components:      Result Value   Glucose, Bld 238 (*)    BUN 24 (*)    Creatinine, Ser 1.28 (*)    GFR, Estimated 46 (*)    All other components within normal limits  CBC WITH DIFFERENTIAL/PLATELET  BRAIN NATRIURETIC PEPTIDE  LIPASE, BLOOD  URINALYSIS, ROUTINE W REFLEX MICROSCOPIC  TROPONIN I (HIGH  SENSITIVITY)      EKG   EKG Interpretation  Date/Time:  Tuesday July 21 2022 22:40:13 EDT Ventricular Rate:  92 PR Interval:  232 QRS Duration: 98 QT Interval:  394 QTC Calculation: 488 R Axis:   -18 Text Interpretation: Sinus rhythm Prolonged PR interval Confirmed by [place] ([id number]) on 07/22/2022 6:36:01 AM      Q waves seen in leads I and II   Imaging Studies ordered: I ordered imaging studies including chest x-ray, CTAP, CT PE I independently visualized and interpreted imaging. I agree with the radiologist interpretation   Medicines ordered and prescription drug management: Meds ordered this encounter  Medications   morphine (PF) 4 MG/ML injection 4 mg   ondansetron (ZOFRAN) injection 4 mg    -I have reviewed the patients home medicines and have made adjustments as needed  Critical interventions none    Cardiac Monitoring: The patient was maintained on a cardiac monitor.  I personally viewed and interpreted the cardiac monitored which showed an underlying rhythm of: NSR  Social Determinants of Health:  Factors impacting patients care include: none   Reevaluation: After the interventions noted above, I reevaluated the patient and found that they have :improved  Co morbidities that complicate the  patient evaluation  Past Medical History:  Diagnosis Date   Anxiety    Carpal tunnel syndrome 06/14/2014   Chest pain    CHF (congestive heart failure) (HCC)    Chronic back pain    Chronic kidney disease 03/2013   failure due to meds   Diabetes mellitus type II, uncontrolled    Diverticulitis    Frequent headaches    Hiatal hernia    Hyperlipemia    Hypertension    Hypothyroidism    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   Neuropathy    Obesity    Pneumonia    Vitamin D deficiency 06/05/2014      Dispostion: I considered admission for this patient, but we have shared decision making the patient states that she would like to be discharged in the  care of her daughter.  I gave her very strict return precautions and encouraged her to have urgent follow-up with her cardiologist, and if chest pain not resolving to return to the emergency department.  Patient voiced understanding of this and was discharged     Final Clinical Impression(s) / ED Diagnoses Final diagnoses:  None     _0 @    Teressa Lower, MD 07/22/22 [phone]

## 2022-07-22 ENCOUNTER — Emergency Department (HOSPITAL_COMMUNITY): Payer: 59

## 2022-07-22 DIAGNOSIS — R079 Chest pain, unspecified: Secondary | ICD-10-CM | POA: Diagnosis not present

## 2022-07-22 LAB — URINALYSIS, ROUTINE W REFLEX MICROSCOPIC
Bilirubin Urine: NEGATIVE
Glucose, UA: >=500 mg/dL — AB
Hgb urine dipstick: NEGATIVE
Ketones, ur: NEGATIVE mg/dL
Leukocytes,Ua: NEGATIVE
Nitrite: NEGATIVE
Protein, ur: NEGATIVE mg/dL
Specific Gravity, Urine: 1.01 (ref 1.005–1.030)
pH: 6 (ref 5.0–8.0)

## 2022-07-22 LAB — LIPASE, BLOOD: Lipase: 42 U/L (ref 11–51)

## 2022-07-22 LAB — TROPONIN I (HIGH SENSITIVITY): Troponin I (High Sensitivity): 3 ng/L (ref <18)

## 2022-07-22 LAB — BRAIN NATRIURETIC PEPTIDE: B Natriuretic Peptide: 14.1 pg/mL (ref 0.0–100.0)

## 2022-07-22 MED ORDER — LIDOCAINE 5 % EX PTCH: 1.0000 | MEDICATED_PATCH | CUTANEOUS | 0 refills | Status: DC

## 2022-07-22 MED ORDER — MAALOX MAX 400-400-40 MG/5ML PO SUSP: 15.0000 mL | Freq: Four times a day (QID) | ORAL | 0 refills | Status: DC | PRN

## 2022-07-22 MED ORDER — ALUM & MAG HYDROXIDE-SIMETH 200-200-20 MG/5ML PO SUSP
30.0000 mL | Freq: Once | ORAL | Status: AC
  Administered 2022-07-22: 30 mL via ORAL
  Filled 2022-07-22: qty 30

## 2022-07-22 MED ORDER — OXYCODONE HCL 5 MG PO TABS: 5.0000 mg | ORAL_TABLET | ORAL | 0 refills | Status: DC | PRN

## 2022-07-22 MED ORDER — IOHEXOL 350 MG/ML SOLN
100.0000 mL | Freq: Once | INTRAVENOUS | Status: AC | PRN
  Administered 2022-07-22: 100 mL via INTRAVENOUS

## 2022-07-22 MED ORDER — LIDOCAINE 5 % EX PTCH
2.0000 | MEDICATED_PATCH | CUTANEOUS | Status: DC
  Administered 2022-07-22: 2 via TRANSDERMAL
  Filled 2022-07-22: qty 2

## 2022-07-22 MED ORDER — LIDOCAINE VISCOUS HCL 2 % MT SOLN
15.0000 mL | Freq: Once | OROMUCOSAL | Status: AC
  Administered 2022-07-22: 15 mL via ORAL
  Filled 2022-07-22: qty 15

## 2022-07-22 NOTE — ED Notes (Signed)
RN reviewed discharge instructions with pt. Pt verbalized understanding and had no further questions. VSS upon discharge.  

## 2022-07-22 NOTE — Discharge Instructions (Addendum)
You were seen in the emergency room today for evaluation of chest pain.  Your work-up today was reassuringly normal and we performed troponin testing, as well as a CT pulmonary embolism study that was reassuringly negative for a blood clot in your lungs or other life-threatening pathology.  Your pain appears to be reproducible here in the emergency department and this may be secondary to something called costochondritis which is an inflammation of the rib space and the muscles.  However, given your previous history, this chest pain is something that we do need to keep a close eye on it is very important that you call your cardiologist and set up urgent follow-up for likely outpatient stress testing.  If your chest pain worsens or you have associated sweating, vomiting, shortness of breath or any other concerning symptoms please return to the emergency department immediately

## 2022-07-22 NOTE — ED Notes (Signed)
Patient transported to CT 

## 2022-11-27 ENCOUNTER — Encounter: Payer: 59 | Admitting: Obstetrics and Gynecology

## 2023-01-04 ENCOUNTER — Encounter: Payer: Self-pay | Admitting: *Deleted

## 2023-01-21 ENCOUNTER — Encounter: Payer: Self-pay | Admitting: Family Medicine

## 2023-01-21 ENCOUNTER — Ambulatory Visit (INDEPENDENT_AMBULATORY_CARE_PROVIDER_SITE_OTHER): Payer: 59 | Admitting: Family Medicine

## 2023-01-21 VITALS — BP 120/74 | Ht 66.0 in | Wt 300.0 lb

## 2023-01-21 DIAGNOSIS — M170 Bilateral primary osteoarthritis of knee: Secondary | ICD-10-CM | POA: Diagnosis not present

## 2023-01-21 MED ORDER — MELOXICAM 15 MG PO TABS: 15.0000 mg | ORAL_TABLET | Freq: Every day | ORAL | 1 refills | Status: DC | PRN

## 2023-01-21 NOTE — Patient Instructions (Signed)
Good to see you Please use ice as needed  Please use the mobic and tylenol  We'll run the benefits for the gel injection   Please send me a message in MyChart with any questions or updates.  Please see me back to have the injection .   --Dr. Jordan Likes

## 2023-01-21 NOTE — Progress Notes (Signed)
  Anita James - 72 y.o. female MRN [id number]  Date of birth: December 12, 1950  SUBJECTIVE:  Including CC & ROS.  No chief complaint on file.   Anita James is a 72 y.o. female that is presenting with acute on chronic bilateral knee pain.  Her knee pain is associated with medial joint pain.  She has done well with previous gel injections.  Denies any injury or inciting event.    Review of Systems See HPI   HISTORY: Past Medical, Surgical, Social, and Family History Reviewed & Updated per EMR.   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   Neuropathy    Obesity    Pneumonia    Vitamin D deficiency 06/05/2014    Past Surgical History:  Procedure Laterality Date   CESAREAN SECTION     COLONOSCOPY WITH PROPOFOL N/A 07/01/2021   Procedure: COLONOSCOPY WITH PROPOFOL;  Surgeon: Kerin Salen, MD;  Location: [facility];  Service: Gastroenterology;  Laterality: N/A;   KNEE SURGERY Left    THYROID SURGERY     goiter     PHYSICAL EXAM:  VS: BP 120/74 (BP Location: Left Arm, Patient Position: Sitting)   Ht 5\' 6"  (1.676 m)   Wt 300 lb (136.1 kg)   BMI 48.42 kg/m  Physical Exam Gen: NAD, alert, cooperative with exam, well-appearing MSK:  Neurovascularly intact       ASSESSMENT & PLAN:   OA (osteoarthritis) of knee Acute on chronic in nature.  Exacerbation of her underlying degenerative changes. -Counseled on home exercise therapy and supportive care. -Avoiding steroid injections with her elevated blood sugars. -Meloxicam temporarily until we get approved for gel injections. -Pursue  gel injections bilaterally. -Could consider medial unloader braces  -Provided prescription for rolling walker

## 2023-01-21 NOTE — Assessment & Plan Note (Signed)
Acute on chronic in nature.  Exacerbation of her underlying degenerative changes. -Counseled on home exercise therapy and supportive care. -Avoiding steroid injections with her elevated blood sugars. -Meloxicam temporarily until we get approved for gel injections. -Pursue gel injections bilaterally. -Could consider medial unloader braces  -Provided prescription for rolling walker

## 2023-01-22 ENCOUNTER — Telehealth: Payer: Self-pay | Admitting: *Deleted

## 2023-01-22 NOTE — Telephone Encounter (Signed)
Verified Durolane benefits for bilateral knees via [employer].

## 2023-01-29 NOTE — Telephone Encounter (Signed)
Left message for patient to call back. Rec'd summary of benefits for pt's Durolane. Pt's [employer] plan covers Durolane and CPT 20610 at 80%. Deductible has been met. If OOP is met, coverage goes to 100%. No precert is required. Patient has Medicaid as secondary patient's left over responsibility can be billed to Medicaid. However, Medicaid did not provide coverage benefits.  Pt will need to call Medicaid to verify Medicaid benefits.

## 2023-02-01 ENCOUNTER — Other Ambulatory Visit: Payer: Self-pay | Admitting: Family Medicine

## 2023-02-01 DIAGNOSIS — Z1231 Encounter for screening mammogram for malignant neoplasm of breast: Secondary | ICD-10-CM

## 2023-02-02 ENCOUNTER — Other Ambulatory Visit: Payer: Self-pay | Admitting: Family Medicine

## 2023-02-02 DIAGNOSIS — R928 Other abnormal and inconclusive findings on diagnostic imaging of breast: Secondary | ICD-10-CM

## 2023-04-02 ENCOUNTER — Other Ambulatory Visit: Payer: Self-pay | Admitting: *Deleted

## 2023-04-02 MED ORDER — MELOXICAM 15 MG PO TABS: 15.0000 mg | ORAL_TABLET | Freq: Every day | ORAL | 1 refills | Status: DC | PRN

## 2023-04-30 ENCOUNTER — Encounter (HOSPITAL_BASED_OUTPATIENT_CLINIC_OR_DEPARTMENT_OTHER): Payer: Self-pay | Admitting: Emergency Medicine

## 2023-04-30 ENCOUNTER — Emergency Department (HOSPITAL_BASED_OUTPATIENT_CLINIC_OR_DEPARTMENT_OTHER): Payer: 59

## 2023-04-30 ENCOUNTER — Other Ambulatory Visit: Payer: Self-pay

## 2023-04-30 ENCOUNTER — Emergency Department (HOSPITAL_BASED_OUTPATIENT_CLINIC_OR_DEPARTMENT_OTHER)
Admission: EM | Admit: 2023-04-30 | Discharge: 2023-05-01 | Disposition: A | Discharge status: Home / Self Care | Payer: 59 | Attending: Emergency Medicine | Admitting: Emergency Medicine

## 2023-04-30 DIAGNOSIS — E039 Hypothyroidism, unspecified: Secondary | ICD-10-CM | POA: Insufficient documentation

## 2023-04-30 DIAGNOSIS — N189 Chronic kidney disease, unspecified: Secondary | ICD-10-CM | POA: Diagnosis not present

## 2023-04-30 DIAGNOSIS — R0602 Shortness of breath: Secondary | ICD-10-CM | POA: Diagnosis not present

## 2023-04-30 DIAGNOSIS — Z1152 Encounter for screening for COVID-19: Secondary | ICD-10-CM | POA: Diagnosis not present

## 2023-04-30 DIAGNOSIS — K439 Ventral hernia without obstruction or gangrene: Secondary | ICD-10-CM | POA: Diagnosis not present

## 2023-04-30 DIAGNOSIS — I130 Hypertensive heart and chronic kidney disease with heart failure and stage 1 through stage 4 chronic kidney disease, or unspecified chronic kidney disease: Secondary | ICD-10-CM | POA: Insufficient documentation

## 2023-04-30 DIAGNOSIS — R0789 Other chest pain: Secondary | ICD-10-CM | POA: Insufficient documentation

## 2023-04-30 DIAGNOSIS — E1140 Type 2 diabetes mellitus with diabetic neuropathy, unspecified: Secondary | ICD-10-CM | POA: Diagnosis not present

## 2023-04-30 DIAGNOSIS — I509 Heart failure, unspecified: Secondary | ICD-10-CM | POA: Insufficient documentation

## 2023-04-30 DIAGNOSIS — R1084 Generalized abdominal pain: Secondary | ICD-10-CM

## 2023-04-30 DIAGNOSIS — E1122 Type 2 diabetes mellitus with diabetic chronic kidney disease: Secondary | ICD-10-CM | POA: Insufficient documentation

## 2023-04-30 LAB — CBC
HCT: 38.8 % (ref 36.0–46.0)
Hemoglobin: 13.3 g/dL (ref 12.0–15.0)
MCH: 31.3 pg (ref 26.0–34.0)
MCHC: 34.3 g/dL (ref 30.0–36.0)
MCV: 91.3 fL (ref 80.0–100.0)
Platelets: 170 10*3/uL (ref 150–400)
RBC: 4.25 MIL/uL (ref 3.87–5.11)
RDW: 13.3 % (ref 11.5–15.5)
WBC: 5.8 10*3/uL (ref 4.0–10.5)
nRBC: 0 % (ref 0.0–0.2)

## 2023-04-30 LAB — BASIC METABOLIC PANEL
Anion gap: 11 (ref 5–15)
BUN: 25 mg/dL — ABNORMAL HIGH (ref 8–23)
CO2: 27 mmol/L (ref 22–32)
Calcium: 9.6 mg/dL (ref 8.9–10.3)
Chloride: 100 mmol/L (ref 98–111)
Creatinine, Ser: 1.35 mg/dL — ABNORMAL HIGH (ref 0.44–1.00)
GFR, Estimated: 43 mL/min — ABNORMAL LOW (ref >60)
Glucose, Bld: 236 mg/dL — ABNORMAL HIGH (ref 70–99)
Potassium: 3.5 mmol/L (ref 3.5–5.1)
Sodium: 138 mmol/L (ref 135–145)

## 2023-04-30 LAB — HEPATIC FUNCTION PANEL
ALT: 17 U/L (ref 0–44)
AST: 21 U/L (ref 15–41)
Albumin: 3.7 g/dL (ref 3.5–5.0)
Alkaline Phosphatase: 52 U/L (ref 38–126)
Bilirubin, Direct: 0.1 mg/dL (ref 0.0–0.2)
Indirect Bilirubin: 0.9 mg/dL (ref 0.3–0.9)
Total Bilirubin: 1 mg/dL (ref 0.3–1.2)
Total Protein: 7.1 g/dL (ref 6.5–8.1)

## 2023-04-30 LAB — LIPASE, BLOOD: Lipase: 33 U/L (ref 11–51)

## 2023-04-30 LAB — TROPONIN I (HIGH SENSITIVITY): Troponin I (High Sensitivity): 4 ng/L (ref <18)

## 2023-04-30 LAB — BRAIN NATRIURETIC PEPTIDE: B Natriuretic Peptide: 25.6 pg/mL (ref 0.0–100.0)

## 2023-04-30 LAB — PROTIME-INR
INR: 1 (ref 0.8–1.2)
Prothrombin Time: 13 seconds (ref 11.4–15.2)

## 2023-04-30 LAB — OCCULT BLOOD X 1 CARD TO LAB, STOOL: Fecal Occult Bld: NEGATIVE

## 2023-04-30 LAB — SARS CORONAVIRUS 2 BY RT PCR: SARS Coronavirus 2 by RT PCR: NEGATIVE

## 2023-04-30 NOTE — ED Provider Notes (Signed)
Emergency Department Provider Note   I have reviewed the triage vital signs and the nursing notes.   HISTORY  Chief Complaint Chest Pain and Abdominal Pain   HPI Anita James is a 72 y.o. female with PMH including CKD, CHF, HLD, HTN, and neuropathy presents emergency brought with chest discomfort along with shortness of breath and burning generalized abdominal pain.  Symptoms have progressed over the past 2 days.  Recently switched care to [facility] and was seen yesterday for similar symptoms.  She states blood work was performed and she was discharged home where she continued to feel worse over the past 24 hours.  She noticed this morning she was passing some/tarry stools.  She takes baby aspirin but is not otherwise anticoagulated.  No fevers or chills.  Chest discomfort is pressure-like feeling short of breath.  She notes that she has been white with this sharp/pleuritic pain to the chest. No fever or chills.   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 Respiratory: Positive shortness of breath. Gastrointestinal: Positive abdominal pain.  No nausea, no vomiting.  No diarrhea.  No constipation. Positive black, tarry stools.  Genitourinary: Negative for dysuria. Musculoskeletal: Negative for back pain. Skin: Negative for rash. Neurological: Negative for headaches, focal weakness or  numbness. ____________________________________________   PHYSICAL EXAM:  VITAL SIGNS: ED Triage Vitals  Encounter Vitals Group     BP 04/30/23 2137 119/70     Pulse Rate 04/30/23 2137 78     Resp 04/30/23 2137 16     Temp 04/30/23 2137 97.8 F (36.6 C)     Temp Source 04/30/23 2137 Oral     SpO2 04/30/23 2137 100 %   Constitutional: Alert and oriented. Well appearing and in no acute distress. Eyes: Conjunctivae are normal.  Head: Atraumatic. Nose: No congestion/rhinnorhea. Mouth/Throat: Mucous membranes are moist.  Neck: No stridor.   Cardiovascular: Normal rate, regular rhythm. Good peripheral circulation. Grossly normal heart sounds.   Respiratory: Normal respiratory effort.  No retractions. Lungs CTAB. Gastrointestinal: Soft and nontender. No distention. Rectal exam performed with patient's verbal consent and nurse chaperone present. No gross blood or melena.  Musculoskeletal: No lower extremity tenderness nor edema. No gross deformities of extremities. Neurologic:  Normal speech and language. No gross focal neurologic deficits are appreciated.  Skin:  Skin is warm, dry and intact. No rash noted.   ____________________________________________   LABS (all labs ordered are listed, but only abnormal results are displayed)  Labs Reviewed  SARS CORONAVIRUS 2 BY RT PCR  BASIC METABOLIC PANEL  CBC  HEPATIC FUNCTION PANEL  LIPASE, BLOOD  PROTIME-INR  URINALYSIS, ROUTINE W REFLEX MICROSCOPIC  BRAIN NATRIURETIC PEPTIDE  POC OCCULT BLOOD, ED  TROPONIN I (HIGH SENSITIVITY)   ____________________________________________  EKG   EKG Interpretation Date/Time:  Friday April 30 2023 21:39:39 EDT Ventricular Rate:  80 PR Interval:  189 QRS Duration:  108 QT Interval:  403 QTC Calculation: 465 R Axis:   -40  Text Interpretation: Sinus rhythm LAD, consider LAFB or inferior infarct Low voltage, precordial leads Abnormal R-wave progression, early transition Consider anterior  infarct Similar to Oct 2023 tracing Confirmed by Alona Bene [phone]) on 04/30/2023 9:42:32 PM        ____________________________________________  RADIOLOGY  No results found.  ____________________________________________   PROCEDURES  Procedure(s) performed:   Procedures   ____________________________________________   INITIAL IMPRESSION / ASSESSMENT AND PLAN / ED COURSE  Pertinent labs & imaging results that were available during my care of the patient were reviewed by me and considered in my medical decision making (see chart for details).   This patient is Presenting for Evaluation of CP, which does require a range of treatment options, and is a complaint that involves a high risk of morbidity and mortality.  The Differential Diagnoses includes but is not exclusive to acute coronary syndrome, aortic dissection, pulmonary embolism, cardiac tamponade, community-acquired pneumonia, pericarditis, musculoskeletal chest wall pain, etc.   Critical Interventions-    Medications - No data to display  Reassessment after intervention:     I decided to review pertinent External Data, and in summary unable to review records from [facility] yesterday.   Clinical Laboratory Tests Ordered, included ***  Radiologic Tests Ordered, included CXR. I independently interpreted the images and agree with radiology interpretation.   Cardiac Monitor Tracing which shows NSR.    Social Determinants of Health Risk patient is a non-smoker.   Consult complete with  Medical Decision Making: Summary:  Patient presents to the emergency department with chest pain along with shortness of breath for the past 2 days.  Also describes some generalized burning abdominal discomfort and tarry stools.  Plan for rectal exam.  Vital signs are normal.  No SIRS vitals. Clinically, doubt sepsis. Doubt PE although considered.   Reevaluation with update and discussion with   ***Considered  admission***  Patient's presentation is most consistent with acute presentation with potential threat to life or bodily function.   Disposition:   ____________________________________________  FINAL CLINICAL IMPRESSION(S) / ED DIAGNOSES  Final diagnoses:  None     NEW OUTPATIENT MEDICATIONS STARTED DURING THIS VISIT:  New Prescriptions   No medications on file    Note:  This document was prepared using Dragon voice recognition software and may include unintentional dictation errors.  Alona Bene, MD, [facility]

## 2023-04-30 NOTE — ED Notes (Signed)
Staff @bedside ; Jess RN to message when patient can transport for imaging.

## 2023-04-30 NOTE — ED Triage Notes (Addendum)
Patient here from home with chest pain, shortness of breath.  She has been having dark, tarry stools and her abdomen feels like it is on fire.  She states she went to [facility] yesterday for her chest pain and states that they told her she was fine.  Patient with shortness of breath in triage.

## 2023-05-01 ENCOUNTER — Encounter (HOSPITAL_BASED_OUTPATIENT_CLINIC_OR_DEPARTMENT_OTHER): Payer: Self-pay

## 2023-05-01 ENCOUNTER — Emergency Department (HOSPITAL_BASED_OUTPATIENT_CLINIC_OR_DEPARTMENT_OTHER): Payer: 59

## 2023-05-01 DIAGNOSIS — K439 Ventral hernia without obstruction or gangrene: Secondary | ICD-10-CM | POA: Diagnosis not present

## 2023-05-01 MED ORDER — FENTANYL CITRATE PF 50 MCG/ML IJ SOSY
50.0000 ug | PREFILLED_SYRINGE | Freq: Once | INTRAMUSCULAR | Status: AC
  Administered 2023-05-01: 50 ug via INTRAVENOUS
  Filled 2023-05-01: qty 1

## 2023-05-01 MED ORDER — IOHEXOL 350 MG/ML SOLN
75.0000 mL | Freq: Once | INTRAVENOUS | Status: AC | PRN
  Administered 2023-05-01: 75 mL via INTRAVENOUS

## 2023-05-01 MED ORDER — ONDANSETRON 4 MG PO TBDP: 4.0000 mg | ORAL_TABLET | Freq: Three times a day (TID) | ORAL | 0 refills | Status: DC | PRN

## 2023-05-01 MED ORDER — ONDANSETRON HCL 4 MG/2ML IJ SOLN
4.0000 mg | Freq: Once | INTRAMUSCULAR | Status: AC
  Administered 2023-05-01: 4 mg via INTRAVENOUS
  Filled 2023-05-01: qty 2

## 2023-05-01 MED ORDER — PANTOPRAZOLE SODIUM 40 MG IV SOLR
40.0000 mg | Freq: Once | INTRAVENOUS | Status: AC
  Administered 2023-05-01: 40 mg via INTRAVENOUS
  Filled 2023-05-01: qty 10

## 2023-05-01 MED ORDER — PANTOPRAZOLE SODIUM 40 MG PO TBEC: 40.0000 mg | DELAYED_RELEASE_TABLET | Freq: Every day | ORAL | 0 refills | Status: DC

## 2023-05-01 MED ORDER — ALUM & MAG HYDROXIDE-SIMETH 200-200-20 MG/5ML PO SUSP
30.0000 mL | Freq: Once | ORAL | Status: AC
  Administered 2023-05-01: 30 mL via ORAL
  Filled 2023-05-01: qty 30

## 2023-05-01 NOTE — ED Provider Notes (Signed)
Patient care assumed at 2300.  Patient with history of CKD, CHF, hypertension, hyperlipidemia here for evaluation of chest pain, shortness of breath as well as burning generalized abdominal pain.  Care assumed pending labs and reassessment.  Labs are near her baseline.  Troponin is negative, presentation is not consistent with ACS.  Given her multiple symptoms a CTA PE study as well as a CT abdomen pelvis were obtained.  Imaging is negative for clear source of her symptoms.  She does have a abdominal wall hernia on CT that does not appear obstructed.  On examination this area is soft and nontender.  Do not feel that her current symptoms are attributable to this hernia.  Suspect that she does have some gastritis/reflux as she has been out of her Dexilant due to prior authorization issues.  Will start her on Protonix.  Will prescribe Zofran.  Discussed importance of PCP follow-up as well as referral to surgery regarding her abdominal wall hernia.  Discussed outpatient follow-up and return precautions.   Tilden Fossa, MD 05/01/23 [phone]

## 2023-05-19 ENCOUNTER — Other Ambulatory Visit: Payer: Self-pay | Admitting: Family Medicine

## 2023-05-19 DIAGNOSIS — N644 Mastodynia: Secondary | ICD-10-CM

## 2023-05-28 ENCOUNTER — Ambulatory Visit
Admission: RE | Admit: 2023-05-28 | Discharge: 2023-05-28 | Disposition: A | Discharge status: Home / Self Care | Payer: 59 | Source: Ambulatory Visit | Attending: Family Medicine | Admitting: Family Medicine

## 2023-05-28 ENCOUNTER — Ambulatory Visit: Payer: 59

## 2023-05-28 DIAGNOSIS — N644 Mastodynia: Secondary | ICD-10-CM

## 2023-06-25 ENCOUNTER — Other Ambulatory Visit: Payer: Self-pay | Admitting: Surgery

## 2023-09-02 ENCOUNTER — Ambulatory Visit: Payer: 59 | Admitting: Family Medicine

## 2023-09-03 ENCOUNTER — Encounter: Payer: Self-pay | Admitting: Family Medicine

## 2023-09-03 ENCOUNTER — Ambulatory Visit (INDEPENDENT_AMBULATORY_CARE_PROVIDER_SITE_OTHER): Payer: 59 | Admitting: Family Medicine

## 2023-09-03 VITALS — BP 130/80 | Ht 66.0 in

## 2023-09-03 DIAGNOSIS — M25562 Pain in left knee: Secondary | ICD-10-CM

## 2023-09-03 DIAGNOSIS — R262 Difficulty in walking, not elsewhere classified: Secondary | ICD-10-CM | POA: Diagnosis not present

## 2023-09-03 DIAGNOSIS — M25561 Pain in right knee: Secondary | ICD-10-CM | POA: Diagnosis not present

## 2023-09-03 DIAGNOSIS — G8929 Other chronic pain: Secondary | ICD-10-CM

## 2023-09-03 DIAGNOSIS — M170 Bilateral primary osteoarthritis of knee: Secondary | ICD-10-CM | POA: Diagnosis not present

## 2023-09-03 MED ORDER — LIDOCAINE 5 % EX PTCH: 1.0000 | MEDICATED_PATCH | CUTANEOUS | 0 refills | Status: AC

## 2023-09-03 NOTE — Progress Notes (Unsigned)
CHIEF COMPLAINT: No chief complaint on file.  _____________________________________________________________ SUBJECTIVE  HPI  Pt is a 72 y.o. female here for evaluation of bilateral knee pain.   Fell years ago and injured her R leg and sustained L compensatory leg pain. 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   Pain is located Radiating Numbness/tingling Keeps her up at night Exacerbated by Therapies tried so far  Last seen in clinic 01/2023, note reviewed: -Acute on chronic arthritic pain, counseled on home exercise therapy of care -Avoiding steroid injections due to diabetes, treated pain with meloxicam while waiting to get approval for gel injections -Had been contacted next day with respect to Durolane coverage  Last Creatinine 04/30/23 1.35, nonfasting glucose 236. EMR review has documentation of medication noncompliance, nutritional noncompliance. Patient reports she has *** been hospitalized for DKA  Thinks A1c has been <7.0 recently. But her BG levels continue to be fairly elevated.  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   Neuropathy    Obesity    Pneumonia    Vitamin D deficiency 06/05/2014    Past Surgical History:  Procedure Laterality Date   CESAREAN SECTION     COLONOSCOPY WITH PROPOFOL N/A 07/01/2021   Procedure: COLONOSCOPY WITH PROPOFOL;  Surgeon: Kerin Salen, MD;  Location: [facility];  Service: Gastroenterology;  Laterality: N/A;   KNEE SURGERY Left    THYROID SURGERY      goiter      Outpatient Encounter Medications as of 09/03/2023  Medication Sig Note   Accu-Chek FastClix Lancets MISC USE AS DIRECTED TO CHECK BLOOD SUGAR THREE TIMES DAILY    ACCU-CHEK GUIDE test strip USE AS DIRECTED TO CHECK BLOOD SUGAR THREE TIMES DAILY    albuterol (ACCUNEB) 0.63 MG/3ML nebulizer solution Take 1 ampule by nebulization 3 (three) times daily as needed for wheezing or shortness of breath.    alum & mag hydroxide-simeth (MAALOX MAX) 400-400-40 MG/5ML suspension Take 15 mLs by mouth every 6 (six) hours as needed for indigestion.    aspirin EC 81 MG tablet Take 81 mg by mouth daily.    Blood Glucose Monitoring Suppl (ACCU-CHEK AVIVA PLUS) w/Device KIT USE AS DIRECTED TO TEST TWICE DAILY    carvedilol (COREG) 12.5 MG tablet Take 1.5 tablets (18.75 mg total) by mouth 2 (two) times daily with a meal.    cetirizine (ZYRTEC) 10 MG tablet Take 10 mg by mouth daily as needed for allergies.    DEXILANT 60 MG capsule TAKE ONE CAPSULE BY MOUTH DAILY (Patient taking differently: Take 60 mg by mouth daily.)    diclofenac sodium (VOLTAREN) 1 % GEL Apply 4 g topically 4 (four) times daily. (Patient taking differently: Apply 4 g topically daily as needed (pain).)    ezetimibe (ZETIA) 10 MG tablet Take 10 mg by mouth every evening.    FARXIGA 10 MG TABS tablet Take 10 mg by mouth daily.    fluticasone (FLONASE) 50 MCG/ACT nasal spray PLACE 2 SPRAYS IN EACH NOSTRIL DAILY AS NEEDED FOR ALLERGIES  gabapentin (NEURONTIN) 300 MG capsule TAKE 2 CAPSULES BY MOUTH TWICE DAILY    HYDROcodone-acetaminophen (NORCO/VICODIN) 5-325 MG tablet Take 1 tablet by mouth every 8 (eight) hours as needed.    insulin aspart (NOVOLOG FLEXPEN) 100 UNIT/ML FlexPen Inject 60 Units into the skin 3 (three) times daily with meals. (Patient taking differently: Inject 65 Units into the skin 3 (three) times daily with meals.)    Insulin Glargine (LANTUS SOLOSTAR) 100 UNIT/ML Solostar Pen Inject 84 Units into the skin daily.  (Patient taking differently: Inject 86 Units into the skin daily.) 07/01/2021: 50 units   Insulin Pen Needle (TRUEPLUS PEN NEEDLES) 32G X 4 MM MISC Use to inject insulin.    levothyroxine (SYNTHROID) 100 MCG tablet Take 1 tablet (100 mcg total) by mouth daily. 06/30/2021: BRAND NAME ONLY    lidocaine (LIDODERM) 5 % Place 1 patch onto the skin every 12 (twelve) hours. Remove & Discard patch within 12 hours or as directed by MD    lidocaine (LIDODERM) 5 % Place 1 patch onto the skin daily. Remove & Discard patch within 12 hours or as directed by MD    lisinopril (ZESTRIL) 40 MG tablet TAKE 1 TABLET BY MOUTH DAILY    LORazepam (ATIVAN) 0.5 MG tablet Take 0.5 mg by mouth 2 (two) times daily as needed for anxiety.    meloxicam (MOBIC) 15 MG tablet Take 1 tablet (15 mg total) by mouth daily as needed.    metroNIDAZOLE (FLAGYL) 500 MG tablet Take 1 tablet (500 mg total) by mouth 2 (two) times daily. (Patient not taking: No sig reported)    mometasone (NASONEX) 50 MCG/ACT nasal spray Place 2 sprays into the nose daily. (Patient taking differently: Place 2 sprays into the nose daily as needed (allergies).)    NOVOFINE 32G X 6 MM MISC SMARTSIG:1 SUB-Q 4-5 Times Daily    NYSTATIN powder APPLY TOPICALLY 4 TIMES DAILY. (Patient taking differently: Apply 1 Bottle topically 4 (four) times daily as needed (rash).)    ondansetron (ZOFRAN) 4 MG tablet Take 1 tablet (4 mg total) by mouth every 6 (six) hours. (Patient taking differently: Take 4 mg by mouth every 6 (six) hours as needed for nausea or vomiting.)    ondansetron (ZOFRAN-ODT) 4 MG disintegrating tablet Take 1 tablet (4 mg total) by mouth every 8 (eight) hours as needed for nausea or vomiting.    oxyCODONE (ROXICODONE) 5 MG immediate release tablet Take 1 tablet (5 mg total) by mouth every 4 (four) hours as needed for severe pain.    pantoprazole (PROTONIX) 40 MG tablet Take 1 tablet (40 mg total) by mouth daily.    pioglitazone (ACTOS) 30 MG tablet Take 30  mg by mouth daily.    predniSONE (DELTASONE) 5 MG tablet Take 6 pills for first day, 5 pills second day, 4 pills third day, 3 pills fourth day, 2 pills the fifth day, and 1 pill sixth day. (Patient not taking: No sig reported)    Semaglutide,0.25 or 0.5MG /DOS, (OZEMPIC, 0.25 OR 0.5 MG/DOSE,) 2 MG/1.5ML SOPN Inject 0.25 mg into the skin every Friday.    torsemide (DEMADEX) 20 MG tablet Take 20 mg by mouth daily as needed (fluid).    triamcinolone ointment (KENALOG) 0.1 % Apply 1 application topically 2 (two) times daily. To affected areas (Patient taking differently: Apply 1 application  topically daily. To affected areas)    valACYclovir (VALTREX) 1000 MG tablet Take 1 tablet (1,000 mg total) by mouth 2 (two) times daily. (Patient not taking: No  sig reported)    Vitamin D, Ergocalciferol, (DRISDOL) 1.25 MG (50000 UNIT) CAPS capsule Take 50,000 Units by mouth every Wednesday.    No facility-administered encounter medications on file as of 09/03/2023.    ------------------------------------------------------------------------------------------------------  _____________________________________________________________ OBJECTIVE  PHYSICAL EXAM  Today's Vitals   09/03/23 1031  BP: 130/80  Height: 5\' 6"  (1.676 m)   Body mass index is 48.42 kg/m.   reviewed  General: A+Ox3, no acute distress, well-nourished, appropriate affect CV: pulses 2+ regular, nondiaphoretic, no peripheral edema, cap refill <2sec Lungs: no audible wheezing, non-labored breathing, bilateral chest rise/fall, nontachypneic Skin: warm, well-perfused, non-icteric, no susp lesions or rashes Neuro: No focal deficits. Sensation intact, muscle tone wnl, no atrophy Psych: no signs of depression or anxiety MSK: ***     08/19/2021 left knee x-ray independently reviewed, demonstrating notably reduced medial joint spacing with tricompartmental degenerative changes, insall salvati ratio estimated at 1.47.  Posterior tibial  osseous changes.  Reduced patellofemoral joint spacing, sunrise view demonstrating notable lateral deviation and degenerative changes of the patella  07/18/2021 right knee x-ray independently reviewed, also demonstrating reduced medial knee joint spacing with tricompartmental degenerative changes.  Trace lateral joint opacities suggestive of chondrocalcinosis.  Posterior femoral condylar and tibiofibular degenerative changes.  Sunrise view is not available. insall salvati ratio estimated at 1.19 _____________________________________________________________ ASSESSMENT/PLAN Diagnoses and all orders for this visit:  Osteoarthritis of both knees, unspecified osteoarthritis type  Bilateral chronic knee pain -     Ambulatory referral to Physical Therapy  Ambulatory dysfunction -     Ambulatory referral to Physical Therapy    Summary: ***  Differential Diagnosis:  Treatment:   Advised to establish care with a new PCP given c/f hx uncontrolled T2DM and multiple comorbidities, as the current provider on file is no longer practicing  Bands Encourage activity  Shortly after the visit, patient called to the clinic and requested lidocaine patches for back pain, which had not been evaluated today. Last prescription sent for lidocaine patches was in 07/2022, in ED setting, and for chest wall pain.  Back pain was not addressed or discussed during today's visit.  Prior to this, prescription was also sent by an ED provider 06/2021 for hip and leg pain.  A one-time courtesy prescription for lidocaine patches was provided, noted that 4% lidocaine patches are available over the counter.  Patient to schedule an appointment for evaluation of back pain if pain refractory to these patches or if refill is requested.   ***gout knees   Electronically signed by: Burna Forts, MD 09/03/2023 11:29 AM

## 2023-09-08 ENCOUNTER — Telehealth: Payer: Self-pay | Admitting: *Deleted

## 2023-09-08 NOTE — Telephone Encounter (Signed)
Pt informed of below.  Medication ordered with [employer]ana, Durolane and CPT [phone] are covered at 80%. Deductible $240 has been met. If OOP max is met, coverage goes to 100%. $6045 [phone] met). No pre cert or referral required.  Patient has Medicaid as secondary. Member's responsibilities leftover by Medicare can be billed to Medicaid. Member will be covered at 100%.

## 2023-09-09 NOTE — Telephone Encounter (Signed)
Pt informed medication received. OV scheduled 09/10/23.

## 2023-09-10 ENCOUNTER — Other Ambulatory Visit: Payer: Self-pay

## 2023-09-10 ENCOUNTER — Ambulatory Visit (INDEPENDENT_AMBULATORY_CARE_PROVIDER_SITE_OTHER): Payer: 59 | Admitting: Family Medicine

## 2023-09-10 ENCOUNTER — Encounter: Payer: Self-pay | Admitting: Family Medicine

## 2023-09-10 VITALS — BP 128/80 | Ht 66.0 in | Wt 300.0 lb

## 2023-09-10 DIAGNOSIS — M170 Bilateral primary osteoarthritis of knee: Secondary | ICD-10-CM | POA: Diagnosis not present

## 2023-09-10 MED ORDER — SODIUM HYALURONATE 60 MG/3ML IX PRSY
60.0000 mg | PREFILLED_SYRINGE | Freq: Once | INTRA_ARTICULAR | Status: AC
  Administered 2023-09-10: 60 mg via INTRA_ARTICULAR

## 2023-09-10 NOTE — Progress Notes (Unsigned)
CHIEF COMPLAINT: No chief complaint on file.  _____________________________________________________________ SUBJECTIVE  HPI  Pt is a 72 y.o. female here for evaluation of  Athlete/Sport:***   No chief complaint on file.  - Previous similar episode(s):  - Onset:  - Location:  - Duration:  - Character:  - Associated sx: - Alleviating factors:  - Aggravating factors:  - Therapies tried:    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   Neuropathy    Obesity    Pneumonia    Vitamin D deficiency 06/05/2014    Past Surgical History:  Procedure Laterality Date   CESAREAN SECTION     COLONOSCOPY WITH PROPOFOL N/A 07/01/2021   Procedure: COLONOSCOPY WITH PROPOFOL;  Surgeon: Kerin Salen, MD;  Location: [facility];  Service: Gastroenterology;  Laterality: N/A;   KNEE SURGERY Left    THYROID SURGERY     goiter      Outpatient Encounter Medications as of 09/10/2023  Medication Sig Note   Accu-Chek FastClix Lancets MISC USE AS DIRECTED TO CHECK BLOOD SUGAR THREE TIMES DAILY    ACCU-CHEK GUIDE test strip USE AS DIRECTED TO CHECK BLOOD SUGAR THREE TIMES DAILY    albuterol (ACCUNEB) 0.63 MG/3ML nebulizer solution Take 1 ampule by nebulization 3 (three) times daily as needed for wheezing or shortness of breath.    alum & mag hydroxide-simeth (MAALOX MAX) 400-400-40 MG/5ML suspension Take 15 mLs by mouth every 6 (six) hours as needed for indigestion.    aspirin EC 81 MG tablet Take 81 mg by mouth daily.    Blood Glucose  Monitoring Suppl (ACCU-CHEK AVIVA PLUS) w/Device KIT USE AS DIRECTED TO TEST TWICE DAILY    carvedilol (COREG) 12.5 MG tablet Take 1.5 tablets (18.75 mg total) by mouth 2 (two) times daily with a meal.    cetirizine (ZYRTEC) 10 MG tablet Take 10 mg by mouth daily as needed for allergies.    DEXILANT 60 MG capsule TAKE ONE CAPSULE BY MOUTH DAILY (Patient taking differently: Take 60 mg by mouth daily.)    diclofenac sodium (VOLTAREN) 1 % GEL Apply 4 g topically 4 (four) times daily. (Patient taking differently: Apply 4 g topically daily as needed (pain).)    ezetimibe (ZETIA) 10 MG tablet Take 10 mg by mouth every evening.    FARXIGA 10 MG TABS tablet Take 10 mg by mouth daily.    fluticasone (FLONASE) 50 MCG/ACT nasal spray PLACE 2 SPRAYS IN EACH NOSTRIL DAILY AS NEEDED FOR ALLERGIES    gabapentin (NEURONTIN) 300 MG capsule TAKE 2 CAPSULES BY MOUTH TWICE DAILY    HYDROcodone-acetaminophen (NORCO/VICODIN) 5-325 MG tablet Take 1 tablet by mouth every 8 (eight) hours as needed.    insulin aspart (NOVOLOG FLEXPEN) 100 UNIT/ML FlexPen Inject 60 Units into the skin 3 (three) times daily with meals. (Patient taking differently: Inject 65 Units into the skin 3 (three) times daily with meals.)    Insulin Glargine (LANTUS SOLOSTAR) 100 UNIT/ML Solostar Pen Inject 84 Units into the skin daily. (Patient taking differently: Inject 86  Units into the skin daily.) 07/01/2021: 50 units   Insulin Pen Needle (TRUEPLUS PEN NEEDLES) 32G X 4 MM MISC Use to inject insulin.    levothyroxine (SYNTHROID) 100 MCG tablet Take 1 tablet (100 mcg total) by mouth daily. 06/30/2021: BRAND NAME ONLY    lidocaine (LIDODERM) 5 % Place 1 patch onto the skin every 12 (twelve) hours. Remove & Discard patch within 12 hours or as directed by MD    lidocaine (LIDODERM) 5 % Place 1 patch onto the skin daily. Remove & Discard patch within 12 hours or as directed by MD    lisinopril (ZESTRIL) 40 MG tablet TAKE 1 TABLET BY MOUTH DAILY     LORazepam (ATIVAN) 0.5 MG tablet Take 0.5 mg by mouth 2 (two) times daily as needed for anxiety.    meloxicam (MOBIC) 15 MG tablet Take 1 tablet (15 mg total) by mouth daily as needed.    metroNIDAZOLE (FLAGYL) 500 MG tablet Take 1 tablet (500 mg total) by mouth 2 (two) times daily. (Patient not taking: No sig reported)    mometasone (NASONEX) 50 MCG/ACT nasal spray Place 2 sprays into the nose daily. (Patient taking differently: Place 2 sprays into the nose daily as needed (allergies).)    NOVOFINE 32G X 6 MM MISC SMARTSIG:1 SUB-Q 4-5 Times Daily    NYSTATIN powder APPLY TOPICALLY 4 TIMES DAILY. (Patient taking differently: Apply 1 Bottle topically 4 (four) times daily as needed (rash).)    ondansetron (ZOFRAN) 4 MG tablet Take 1 tablet (4 mg total) by mouth every 6 (six) hours. (Patient taking differently: Take 4 mg by mouth every 6 (six) hours as needed for nausea or vomiting.)    ondansetron (ZOFRAN-ODT) 4 MG disintegrating tablet Take 1 tablet (4 mg total) by mouth every 8 (eight) hours as needed for nausea or vomiting.    oxyCODONE (ROXICODONE) 5 MG immediate release tablet Take 1 tablet (5 mg total) by mouth every 4 (four) hours as needed for severe pain.    pantoprazole (PROTONIX) 40 MG tablet Take 1 tablet (40 mg total) by mouth daily.    pioglitazone (ACTOS) 30 MG tablet Take 30 mg by mouth daily.    predniSONE (DELTASONE) 5 MG tablet Take 6 pills for first day, 5 pills second day, 4 pills third day, 3 pills fourth day, 2 pills the fifth day, and 1 pill sixth day. (Patient not taking: No sig reported)    Semaglutide,0.25 or 0.5MG /DOS, (OZEMPIC, 0.25 OR 0.5 MG/DOSE,) 2 MG/1.5ML SOPN Inject 0.25 mg into the skin every Friday.    torsemide (DEMADEX) 20 MG tablet Take 20 mg by mouth daily as needed (fluid).    triamcinolone ointment (KENALOG) 0.1 % Apply 1 application topically 2 (two) times daily. To affected areas (Patient taking differently: Apply 1 application  topically daily. To affected  areas)    valACYclovir (VALTREX) 1000 MG tablet Take 1 tablet (1,000 mg total) by mouth 2 (two) times daily. (Patient not taking: No sig reported)    Vitamin D, Ergocalciferol, (DRISDOL) 1.25 MG (50000 UNIT) CAPS capsule Take 50,000 Units by mouth every Wednesday.    No facility-administered encounter medications on file as of 09/10/2023.    ------------------------------------------------------------------------------------------------------  _____________________________________________________________ OBJECTIVE  PHYSICAL EXAM  Today's Vitals   09/10/23 0900  BP: 128/80  Weight: 300 lb (136.1 kg)  Height: 5\' 6"  (1.676 m)   Body mass index is 48.42 kg/m.   reviewed  General: A+Ox3, no acute distress, well-nourished, appropriate affect CV: pulses 2+ regular, nondiaphoretic,  no peripheral edema, cap refill <2sec Lungs: no audible wheezing, non-labored breathing, bilateral chest rise/fall, nontachypneic Skin: warm, well-perfused, non-icteric, no susp lesions or rashes Neuro: CN II-XII intact bilaterally. Sensation intact, muscle tone wnl, no atrophy Psych: no signs of depression or anxiety MSK: ***      _____________________________________________________________ ASSESSMENT/PLAN Diagnoses and all orders for this visit:  Osteoarthritis of both knees, unspecified osteoarthritis type -     [place] LIMITED JOINT SPACE STRUCTURES LOW LEFT; Future   L then R Summary: ***  Differential Diagnosis:  Treatment:   No follow-ups on file.  Electronically signed by: Burna Forts, MD 09/10/2023 9:41 AM

## 2023-09-19 ENCOUNTER — Emergency Department (HOSPITAL_BASED_OUTPATIENT_CLINIC_OR_DEPARTMENT_OTHER): Payer: 59

## 2023-09-19 ENCOUNTER — Emergency Department (HOSPITAL_BASED_OUTPATIENT_CLINIC_OR_DEPARTMENT_OTHER)
Admission: EM | Admit: 2023-09-19 | Discharge: 2023-09-19 | Disposition: A | Discharge status: Home / Self Care | Payer: 59 | Attending: Emergency Medicine | Admitting: Emergency Medicine

## 2023-09-19 ENCOUNTER — Encounter (HOSPITAL_BASED_OUTPATIENT_CLINIC_OR_DEPARTMENT_OTHER): Payer: Self-pay

## 2023-09-19 ENCOUNTER — Other Ambulatory Visit: Payer: Self-pay

## 2023-09-19 DIAGNOSIS — Z794 Long term (current) use of insulin: Secondary | ICD-10-CM | POA: Insufficient documentation

## 2023-09-19 DIAGNOSIS — E119 Type 2 diabetes mellitus without complications: Secondary | ICD-10-CM | POA: Diagnosis not present

## 2023-09-19 DIAGNOSIS — I509 Heart failure, unspecified: Secondary | ICD-10-CM | POA: Diagnosis not present

## 2023-09-19 DIAGNOSIS — N189 Chronic kidney disease, unspecified: Secondary | ICD-10-CM | POA: Insufficient documentation

## 2023-09-19 DIAGNOSIS — Z79899 Other long term (current) drug therapy: Secondary | ICD-10-CM | POA: Diagnosis not present

## 2023-09-19 DIAGNOSIS — M25571 Pain in right ankle and joints of right foot: Secondary | ICD-10-CM | POA: Diagnosis present

## 2023-09-19 DIAGNOSIS — E039 Hypothyroidism, unspecified: Secondary | ICD-10-CM | POA: Insufficient documentation

## 2023-09-19 DIAGNOSIS — I130 Hypertensive heart and chronic kidney disease with heart failure and stage 1 through stage 4 chronic kidney disease, or unspecified chronic kidney disease: Secondary | ICD-10-CM | POA: Insufficient documentation

## 2023-09-19 MED ORDER — OXYCODONE HCL 5 MG PO TABS: 2.5000 mg | ORAL_TABLET | Freq: Four times a day (QID) | ORAL | 0 refills | Status: DC | PRN

## 2023-09-19 MED ORDER — PREDNISONE 20 MG PO TABS: 40.0000 mg | ORAL_TABLET | Freq: Every day | ORAL | 0 refills | Status: AC

## 2023-09-19 MED ORDER — HYDROCODONE-ACETAMINOPHEN 5-325 MG PO TABS
1.0000 | ORAL_TABLET | Freq: Once | ORAL | Status: AC
  Administered 2023-09-19: 1 via ORAL
  Filled 2023-09-19: qty 1

## 2023-09-19 NOTE — ED Triage Notes (Signed)
C/o right foot swelling and loss of feeling in foot x 4 days. States had injections in leg recently for arthritis. Was also told she has gout. Denies injury. Hx diabetes.  Pt refusing CBG, states has a continuous monitor, its reading 144.

## 2023-09-19 NOTE — Discharge Instructions (Signed)
As discussed, visit emergency department today overall reassuring.  X-ray negative for any fracture or dislocation.  I do suspect pain originating from your tibial talar joint as we discussed.  Will place you on prednisone for treatment of pain/inflammation as well as sending home with some pain medicine to use as needed.  Continue to take Tylenol for baseline..  Recommend follow-up with your orthopedic doctor for reevaluation of your symptoms.  Please do not hesitate to return if the worrisome signs and symptoms we discussed become apparent.

## 2023-09-19 NOTE — ED Provider Notes (Signed)
[facility] Provider Note   CSN: [id number] Arrival date & time: 09/19/23  1535     History  Chief Complaint  Patient presents with   Foot Pain    Anita James is a 72 y.o. female.   Foot Pain   72 year old female presents emergency department with complaints of right ankle pain.  Reports symptoms present for the past 4 days or so.  States that she was walking from her daughter's house to her car when symptoms began on Christmas Day.  Reports persistence of symptoms since then.  Reports pain worse with movement relieved with rest.  Has been placing a lidocaine patch over her foot which has helped some.  Patient also reports decreased sensation on the dorsal aspect of her right foot.  States that this is gradually become more noticeable with the pain.  States that she is on pregabalin due to neuropathy in her hands but she states that she has not had symptoms in her feet.  Denies any known injury/fall/traumas.  States he has a history of gout but typically involves her knees and never her ankle/foot.  Has tried no other medications for her symptoms.  Past medical history significant for CHF, diabetes mellitus type 2 with neuropathy, hypertension, hypothyroidism, hyperlipidemia, CKD, chronic pain syndrome  Home Medications Prior to Admission medications   Medication Sig Start Date End Date Taking? Authorizing Provider  oxyCODONE (ROXICODONE) 5 MG immediate release tablet Take 0.5 tablets (2.5 mg total) by mouth every 6 (six) hours as needed for severe pain (pain score 7-10). 09/19/23  Yes Sherian Maroon A, PA  predniSONE (DELTASONE) 20 MG tablet Take 2 tablets (40 mg total) by mouth daily with breakfast for 6 days. 09/19/23 09/25/23 Yes Peter Garter, PA  Accu-Chek FastClix Lancets MISC USE AS DIRECTED TO CHECK BLOOD SUGAR THREE TIMES DAILY 11/07/19   Marcine Matar, MD  ACCU-CHEK GUIDE test strip USE AS DIRECTED TO CHECK BLOOD SUGAR  THREE TIMES DAILY 10/04/19   Marcine Matar, MD  albuterol (ACCUNEB) 0.63 MG/3ML nebulizer solution Take 1 ampule by nebulization 3 (three) times daily as needed for wheezing or shortness of breath. 01/07/21   [provider]  alum & mag hydroxide-simeth (MAALOX MAX) 400-400-40 MG/5ML suspension Take 15 mLs by mouth every 6 (six) hours as needed for indigestion. 07/22/22   Kommor, Wyn Forster, MD  aspirin EC 81 MG tablet Take 81 mg by mouth daily. 11/20/16   [provider]  Blood Glucose Monitoring Suppl (ACCU-CHEK AVIVA PLUS) w/Device KIT USE AS DIRECTED TO TEST TWICE DAILY 04/27/18   Marcine Matar, MD  carvedilol (COREG) 12.5 MG tablet Take 1.5 tablets (18.75 mg total) by mouth 2 (two) times daily with a meal. 10/13/19   Marcine Matar, MD  cetirizine (ZYRTEC) 10 MG tablet Take 10 mg by mouth daily as needed for allergies.    [provider]  DEXILANT 60 MG capsule TAKE ONE CAPSULE BY MOUTH DAILY Patient taking differently: Take 60 mg by mouth daily. 01/15/20   Marcine Matar, MD  diclofenac sodium (VOLTAREN) 1 % GEL Apply 4 g topically 4 (four) times daily. Patient taking differently: Apply 4 g topically daily as needed (pain). 02/27/19   Melene Plan, DO  ezetimibe (ZETIA) 10 MG tablet Take 10 mg by mouth every evening. 02/05/20   [provider]  FARXIGA 10 MG TABS tablet Take 10 mg by mouth daily. 03/16/20   [provider]  fluticasone (  FLONASE) 50 MCG/ACT nasal spray PLACE 2 SPRAYS IN EACH NOSTRIL DAILY AS NEEDED FOR ALLERGIES 07/21/19   Marcine Matar, MD  gabapentin (NEURONTIN) 300 MG capsule TAKE 2 CAPSULES BY MOUTH TWICE DAILY 08/01/20   Marcine Matar, MD  insulin aspart (NOVOLOG FLEXPEN) 100 UNIT/ML FlexPen Inject 60 Units into the skin 3 (three) times daily with meals. Patient taking differently: Inject 65 Units into the skin 3 (three) times daily with meals. 12/05/19   Marcine Matar, MD  Insulin Glargine (LANTUS SOLOSTAR) 100  UNIT/ML Solostar Pen Inject 84 Units into the skin daily. Patient taking differently: Inject 86 Units into the skin daily. 10/13/19   Marcine Matar, MD  Insulin Pen Needle (TRUEPLUS PEN NEEDLES) 32G X 4 MM MISC Use to inject insulin. 10/13/18   Marcine Matar, MD  levothyroxine (SYNTHROID) 100 MCG tablet Take 1 tablet (100 mcg total) by mouth daily. 10/13/19   Marcine Matar, MD  lidocaine (LIDODERM) 5 % Place 1 patch onto the skin every 12 (twelve) hours. Remove & Discard patch within 12 hours or as directed by MD 07/04/21   Redwine, Madison A, PA-C  lidocaine (LIDODERM) 5 % Place 1 patch onto the skin daily. Remove & Discard patch within 12 hours or as directed by MD 09/03/23 10/03/23  Burna Forts, MD  lisinopril (ZESTRIL) 40 MG tablet TAKE 1 TABLET BY MOUTH DAILY 11/10/19   Marcine Matar, MD  LORazepam (ATIVAN) 0.5 MG tablet Take 0.5 mg by mouth 2 (two) times daily as needed for anxiety. 12/17/20   [provider]  meloxicam (MOBIC) 15 MG tablet Take 1 tablet (15 mg total) by mouth daily as needed. 04/02/23   Hudnall, Azucena Fallen, MD  metroNIDAZOLE (FLAGYL) 500 MG tablet Take 1 tablet (500 mg total) by mouth 2 (two) times daily. Patient not taking: No sig reported 03/04/21   Lorre Nick, MD  mometasone (NASONEX) 50 MCG/ACT nasal spray Place 2 sprays into the nose daily. Patient taking differently: Place 2 sprays into the nose daily as needed (allergies). 08/24/17   Marcine Matar, MD  NOVOFINE 32G X 6 MM MISC SMARTSIG:1 SUB-Q 4-5 Times Daily 01/22/20   [provider]  NYSTATIN powder APPLY TOPICALLY 4 TIMES DAILY. Patient taking differently: Apply 1 Bottle topically 4 (four) times daily as needed (rash). 01/18/17   Langeland, Kathaleen Grinder, MD  ondansetron (ZOFRAN) 4 MG tablet Take 1 tablet (4 mg total) by mouth every 6 (six) hours. Patient taking differently: Take 4 mg by mouth every 6 (six) hours as needed for nausea or vomiting. 09/16/18   Carlyle Basques P, PA-C   ondansetron (ZOFRAN-ODT) 4 MG disintegrating tablet Take 1 tablet (4 mg total) by mouth every 8 (eight) hours as needed for nausea or vomiting. 05/01/23   Tilden Fossa, MD  pantoprazole (PROTONIX) 40 MG tablet Take 1 tablet (40 mg total) by mouth daily. 05/01/23   Tilden Fossa, MD  pioglitazone (ACTOS) 30 MG tablet Take 30 mg by mouth daily. 01/07/21   [provider]  Semaglutide,0.25 or 0.5MG /DOS, (OZEMPIC, 0.25 OR 0.5 MG/DOSE,) 2 MG/1.5ML SOPN Inject 0.25 mg into the skin every Friday.    [provider]  torsemide (DEMADEX) 20 MG tablet Take 20 mg by mouth daily as needed (fluid). 05/09/18   [provider]  triamcinolone ointment (KENALOG) 0.1 % Apply 1 application topically 2 (two) times daily. To affected areas Patient taking differently: Apply 1 application  topically daily. To affected areas 12/31/20  Myra Rude, MD  valACYclovir (VALTREX) 1000 MG tablet Take 1 tablet (1,000 mg total) by mouth 2 (two) times daily. Patient not taking: No sig reported 05/09/19   Hoy Register, MD  Vitamin D, Ergocalciferol, (DRISDOL) 1.25 MG (50000 UNIT) CAPS capsule Take 50,000 Units by mouth every Wednesday.    [provider]      Allergies    Amoxicillin-pot clavulanate, Sulfa antibiotics, Norvasc [amlodipine besylate], Sulfamethoxazole, and Tramadol    Review of Systems   Review of Systems  All other systems reviewed and are negative.   Physical Exam Updated Vital Signs BP (!) 161/69 (BP Location: Right Arm)   Pulse 77   Temp (!) 97.5 F (36.4 C)   Resp 18   Ht 5\' 6"  (1.676 m)   Wt 136 kg   SpO2 95%   BMI 48.39 kg/m  Physical Exam Vitals and nursing note reviewed.  Constitutional:      General: She is not in acute distress.    Appearance: She is well-developed.  HENT:     Head: Normocephalic and atraumatic.  Eyes:     Conjunctiva/sclera: Conjunctivae normal.  Cardiovascular:     Rate and Rhythm: Normal rate and regular rhythm.   Pulmonary:     Effort: Pulmonary effort is normal. No respiratory distress.     Breath sounds: Normal breath sounds.  Abdominal:     Palpations: Abdomen is soft.     Tenderness: There is no abdominal tenderness. There is no guarding.  Musculoskeletal:        General: No swelling.     Cervical back: Neck supple.     Comments: Manage 2+ bilateral lower extremity edema which patient states is baseline.  2+ pedal and posterior tibial pulses.  No overlying erythema, palpable fluctuance/induration.  Tenderness to palpation dorsally over tibiotalar joint.  Pain with resisted dorsiflexion.  Otherwise, no tenderness of foot.  Patient reports decreased sensation dorsally from the talar joint and about distally down forefoot.  Muscular strength 5 out of 5 ankle dorsi suspension flexion, knee flexion/extension.  Skin:    General: Skin is warm and dry.     Capillary Refill: Capillary refill takes less than 2 seconds.  Neurological:     Mental Status: She is alert.  Psychiatric:        Mood and Affect: Mood normal.     ED Results / Procedures / Treatments   Labs (all labs ordered are listed, but only abnormal results are displayed) Labs Reviewed - No data to display  EKG None  Radiology DG Ankle Complete Right Result Date: 09/19/2023 CLINICAL DATA:  swelling/pain worse with palpation EXAM: RIGHT ANKLE - COMPLETE 3 VIEW COMPARISON:  None Available. FINDINGS: There is no evidence of fracture, dislocation, or subluxation. There is no evidence of arthropathy or other focal bone abnormality. Small plantar calcaneal spur. Soft tissue swelling noted at the ankle mortise. IMPRESSION: Soft tissue swelling. No acute osseous abnormality identified. Electronically Signed   By: Layla Maw M.D.   On: 09/19/2023 17:17    Procedures Procedures    Medications Ordered in ED Medications  HYDROcodone-acetaminophen (NORCO/VICODIN) 5-325 MG per tablet 1 tablet (1 tablet Oral Given 09/19/23 1646)    ED  Course/ Medical Decision Making/ A&P                                 Medical Decision Making Amount and/or Complexity of Data Reviewed Radiology: ordered.  Risk Prescription drug management.   This patient presents to the ED for concern of ankle pain, this involves an extensive number of treatment options, and is a complaint that carries with it a high risk of complications and morbidity.  The differential diagnosis includes fracture, strain/pain, dislocation, ligamentous/tendinous injury, neurovascular compromise, neuropathy, CVA, cellulitis, septic arthritis, gout, osteoarthritis, other   Co morbidities that complicate the patient evaluation  See HPI   Additional history obtained:  Additional history obtained from EMR External records from outside source obtained and reviewed including hospital records   Lab Tests:  N/a   Imaging Studies ordered:  I ordered imaging studies including ankle x-ray I independently visualized and interpreted imaging which showed tissue swelling.  No acute abnormality I agree with the radiologist interpretation   Cardiac Monitoring: / EKG:  The patient was maintained on a cardiac monitor.  I personally viewed and interpreted the cardiac monitored which showed an underlying rhythm of: Rhythm   Consultations Obtained:  N/a   Problem List / ED Course / Critical interventions / Medication management  Right ankle pain I ordered medication including Norco   Reevaluation of the patient after these medicines showed that the patient improved I have reviewed the patients home medicines and have made adjustments as needed   Social Determinants of Health:  Denies tobacco, licit drug use   Test / Admission - Considered:  Right ankle pain Vitals signs significant for hyper blood pressure 161/69. Otherwise within normal range and stable throughout visit. Imaging studies significant for: See above left 72 year old female presents  emergency department with complaints of right ankle pain.  Ankle pain present for the last 4 days or so with no reported trauma/injury.  Pain associated with any dorsi/plantarflexion and points to dorsi middle tibiotalar joint as area of pain.  Pain reproducible in area with pain exacerbated with resisted dorsi/plantarflexion.  No overlying skin changes concerning for secondary infectious process.  No pulse deficits suggest ischemic limb.  X-ray obtained given reproducible tenderness to tibiotalar joint which was negative for acute osseous abnormality.  Suspect patient's symptoms could be secondary to underlying injury to the tibiotalar joint.  Patient also with some decrease sensation of skin in the area of reproducible tenderness.  Suspect peripheral nerve compression/impingement due to amount of swelling and focal area and less likely due to CVA.  Will place patient on prednisone given history of CKD; will avoid NSAIDs.  Will recommend follow-up with orthopedics in the outpatient setting as well as rest, ice, elevation.  Treatment plan discussed at length with patient and she acknowledged understanding was agreeable to said plan.  Patient overall well-appearing, afebrile in no acute distress. Worrisome signs and symptoms were discussed with the patient, and the patient acknowledged understanding to return to the ED if noticed. Patient was stable upon discharge.          Final Clinical Impression(s) / ED Diagnoses Final diagnoses:  Acute right ankle pain    Rx / DC Orders ED Discharge Orders     None         Peter Garter, [place] 09/19/23 1742    Pricilla Loveless, MD 09/19/23 2000

## 2023-09-27 ENCOUNTER — Ambulatory Visit: Payer: 59 | Admitting: Family Medicine

## 2023-10-18 ENCOUNTER — Other Ambulatory Visit: Payer: Self-pay | Admitting: *Deleted

## 2023-10-18 NOTE — Telephone Encounter (Signed)
Patient called requesting refill on lidocaine patches. She states her bilateral Durolane injections on 09/10/23 haven't helped much.

## 2023-10-19 ENCOUNTER — Other Ambulatory Visit: Payer: Self-pay | Admitting: Family Medicine

## 2023-10-19 MED ORDER — LIDOCAINE 5 % EX PTCH: 1.0000 | MEDICATED_PATCH | Freq: Two times a day (BID) | CUTANEOUS | 2 refills | Status: AC

## 2023-11-21 ENCOUNTER — Other Ambulatory Visit: Payer: Self-pay

## 2023-11-21 ENCOUNTER — Observation Stay (HOSPITAL_COMMUNITY)
Admission: EM | Admit: 2023-11-21 | Discharge: 2023-11-23 | Disposition: A | Discharge status: Home / Self Care | Attending: Internal Medicine | Admitting: Internal Medicine

## 2023-11-21 ENCOUNTER — Encounter (HOSPITAL_COMMUNITY): Payer: Self-pay

## 2023-11-21 ENCOUNTER — Emergency Department (HOSPITAL_COMMUNITY)

## 2023-11-21 DIAGNOSIS — M109 Gout, unspecified: Secondary | ICD-10-CM | POA: Insufficient documentation

## 2023-11-21 DIAGNOSIS — Z6841 Body Mass Index (BMI) 40.0 and over, adult: Secondary | ICD-10-CM | POA: Diagnosis not present

## 2023-11-21 DIAGNOSIS — I130 Hypertensive heart and chronic kidney disease with heart failure and stage 1 through stage 4 chronic kidney disease, or unspecified chronic kidney disease: Secondary | ICD-10-CM | POA: Insufficient documentation

## 2023-11-21 DIAGNOSIS — I10 Essential (primary) hypertension: Secondary | ICD-10-CM | POA: Diagnosis present

## 2023-11-21 DIAGNOSIS — N189 Chronic kidney disease, unspecified: Secondary | ICD-10-CM | POA: Diagnosis not present

## 2023-11-21 DIAGNOSIS — R32 Unspecified urinary incontinence: Secondary | ICD-10-CM | POA: Insufficient documentation

## 2023-11-21 DIAGNOSIS — Z7984 Long term (current) use of oral hypoglycemic drugs: Secondary | ICD-10-CM | POA: Insufficient documentation

## 2023-11-21 DIAGNOSIS — R200 Anesthesia of skin: Principal | ICD-10-CM

## 2023-11-21 DIAGNOSIS — J45909 Unspecified asthma, uncomplicated: Secondary | ICD-10-CM | POA: Insufficient documentation

## 2023-11-21 DIAGNOSIS — M1A9XX0 Chronic gout, unspecified, without tophus (tophi): Secondary | ICD-10-CM | POA: Insufficient documentation

## 2023-11-21 DIAGNOSIS — Z7982 Long term (current) use of aspirin: Secondary | ICD-10-CM | POA: Diagnosis not present

## 2023-11-21 DIAGNOSIS — G629 Polyneuropathy, unspecified: Secondary | ICD-10-CM

## 2023-11-21 DIAGNOSIS — E119 Type 2 diabetes mellitus without complications: Secondary | ICD-10-CM

## 2023-11-21 DIAGNOSIS — R7989 Other specified abnormal findings of blood chemistry: Secondary | ICD-10-CM

## 2023-11-21 DIAGNOSIS — E1140 Type 2 diabetes mellitus with diabetic neuropathy, unspecified: Secondary | ICD-10-CM | POA: Diagnosis not present

## 2023-11-21 DIAGNOSIS — E1122 Type 2 diabetes mellitus with diabetic chronic kidney disease: Secondary | ICD-10-CM | POA: Diagnosis not present

## 2023-11-21 DIAGNOSIS — R202 Paresthesia of skin: Secondary | ICD-10-CM | POA: Diagnosis present

## 2023-11-21 DIAGNOSIS — E039 Hypothyroidism, unspecified: Secondary | ICD-10-CM | POA: Diagnosis present

## 2023-11-21 DIAGNOSIS — R7402 Elevation of levels of lactic acid dehydrogenase (LDH): Secondary | ICD-10-CM | POA: Diagnosis not present

## 2023-11-21 DIAGNOSIS — I5032 Chronic diastolic (congestive) heart failure: Secondary | ICD-10-CM | POA: Diagnosis present

## 2023-11-21 DIAGNOSIS — Z794 Long term (current) use of insulin: Secondary | ICD-10-CM | POA: Diagnosis not present

## 2023-11-21 DIAGNOSIS — M7989 Other specified soft tissue disorders: Secondary | ICD-10-CM | POA: Insufficient documentation

## 2023-11-21 DIAGNOSIS — E66813 Obesity, class 3: Secondary | ICD-10-CM | POA: Diagnosis not present

## 2023-11-21 DIAGNOSIS — G4733 Obstructive sleep apnea (adult) (pediatric): Secondary | ICD-10-CM

## 2023-11-21 DIAGNOSIS — Z79899 Other long term (current) drug therapy: Secondary | ICD-10-CM | POA: Insufficient documentation

## 2023-11-21 LAB — COMPREHENSIVE METABOLIC PANEL
ALT: 14 U/L (ref 0–44)
AST: 19 U/L (ref 15–41)
Albumin: 3.3 g/dL — ABNORMAL LOW (ref 3.5–5.0)
Alkaline Phosphatase: 68 U/L (ref 38–126)
Anion gap: 11 (ref 5–15)
BUN: 12 mg/dL (ref 8–23)
CO2: 27 mmol/L (ref 22–32)
Calcium: 9.2 mg/dL (ref 8.9–10.3)
Chloride: 104 mmol/L (ref 98–111)
Creatinine, Ser: 0.97 mg/dL (ref 0.44–1.00)
GFR, Estimated: >60 mL/min (ref >60)
Glucose, Bld: 179 mg/dL — ABNORMAL HIGH (ref 70–99)
Potassium: 3.8 mmol/L (ref 3.5–5.1)
Sodium: 142 mmol/L (ref 135–145)
Total Bilirubin: 0.6 mg/dL (ref 0.0–1.2)
Total Protein: 6.5 g/dL (ref 6.5–8.1)

## 2023-11-21 LAB — CBC
HCT: 41.9 % (ref 36.0–46.0)
Hemoglobin: 13.7 g/dL (ref 12.0–15.0)
MCH: 30 pg (ref 26.0–34.0)
MCHC: 32.7 g/dL (ref 30.0–36.0)
MCV: 91.9 fL (ref 80.0–100.0)
Platelets: 191 10*3/uL (ref 150–400)
RBC: 4.56 MIL/uL (ref 3.87–5.11)
RDW: 13.9 % (ref 11.5–15.5)
WBC: 6.6 10*3/uL (ref 4.0–10.5)
nRBC: 0 % (ref 0.0–0.2)

## 2023-11-21 LAB — DIFFERENTIAL
Abs Immature Granulocytes: 0.02 10*3/uL (ref 0.00–0.07)
Basophils Absolute: 0 10*3/uL (ref 0.0–0.1)
Basophils Relative: 0 %
Eosinophils Absolute: 0.1 10*3/uL (ref 0.0–0.5)
Eosinophils Relative: 1 %
Immature Granulocytes: 0 %
Lymphocytes Relative: 26 %
Lymphs Abs: 1.7 10*3/uL (ref 0.7–4.0)
Monocytes Absolute: 0.8 10*3/uL (ref 0.1–1.0)
Monocytes Relative: 13 %
Neutro Abs: 4 10*3/uL (ref 1.7–7.7)
Neutrophils Relative %: 60 %

## 2023-11-21 LAB — I-STAT CHEM 8, ED
BUN: 15 mg/dL (ref 8–23)
Calcium, Ion: 1.07 mmol/L — ABNORMAL LOW (ref 1.15–1.40)
Chloride: 108 mmol/L (ref 98–111)
Creatinine, Ser: 1 mg/dL (ref 0.44–1.00)
Glucose, Bld: 168 mg/dL — ABNORMAL HIGH (ref 70–99)
HCT: 38 % (ref 36.0–46.0)
Hemoglobin: 12.9 g/dL (ref 12.0–15.0)
Potassium: 3.8 mmol/L (ref 3.5–5.1)
Sodium: 143 mmol/L (ref 135–145)
TCO2: 27 mmol/L (ref 22–32)

## 2023-11-21 LAB — CBG MONITORING, ED: Glucose-Capillary: 168 mg/dL — ABNORMAL HIGH (ref 70–99)

## 2023-11-21 LAB — I-STAT CG4 LACTIC ACID, ED: Lactic Acid, Venous: 2 mmol/L (ref 0.5–1.9)

## 2023-11-21 LAB — APTT: aPTT: 29 s (ref 24–36)

## 2023-11-21 LAB — PROTIME-INR
INR: 1 (ref 0.8–1.2)
Prothrombin Time: 12.9 s (ref 11.4–15.2)

## 2023-11-21 LAB — ETHANOL: Alcohol, Ethyl (B): <10 mg/dL (ref <10)

## 2023-11-21 NOTE — Consult Note (Signed)
 NEUROLOGY CONSULT NOTE   Date of service: November 21, 2023 Patient Name: Anita James MRN:  [id number] DOB:  1951-05-04 Chief Complaint: "L sided numbness" Requesting Provider: Derwood Kaplan, MD  History of Present Illness  Anita James is a 73 y.o. female with hx of CHF, DM2, HLD, morbid obesity, HTN, hypothyroidism who was watching TV and had sudden onset numbness in her left hand in the middle finger and that progressed up to involve the entire L hand and left forearm upto elbow. She also has slight numbness in left leg. Symptoms started around 2015.  She walks with a walker at baseline due to knee pain from osteoarthritis. She denies any chest pain.  [id number] Modified rankin score: 3-Moderate disability-requires help but walks WITHOUT assistance IV Thrombolysis: not offered at this time as she is too mild to treat at presentation. EVT: not offered, low suspicion for LVO in the absence of clear LVO signs.  NIHSS components Score: Comment  1a Level of Conscious 0[x]  1[]  2[]  3[]      1b LOC Questions 0[x]  1[]  2[]       1c LOC Commands 0[x]  1[]  2[]       2 Best Gaze 0[x]  1[]  2[]       3 Visual 0[x]  1[]  2[]  3[]      4 Facial Palsy 0[x]  1[]  2[]  3[]      5a Motor Arm - left 0[x]  1[]  2[]  3[]  4[]  UN[]    5b Motor Arm - Right 0[x]  1[]  2[]  3[]  4[]  UN[]    6a Motor Leg - Left 0[]  1[x]  2[]  3[]  4[]  UN[]  Pain in legs limits strength testing  6b Motor Leg - Right 0[]  1[x]  2[]  3[]  4[]  UN[]    7 Limb Ataxia 0[x]  1[]  2[]  3[]  UN[]     8 Sensory 0[]  1[]  2[x]  UN[]      9 Best Language 0[x]  1[]  2[]  3[]      10 Dysarthria 0[x]  1[]  2[]  UN[]      11 Extinct. 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   Neuropathy    Obesity    Pneumonia    Vitamin D deficiency 06/05/2014    Past Surgical History:  Procedure Laterality Date   CESAREAN SECTION     COLONOSCOPY WITH PROPOFOL N/A 07/01/2021   Procedure: COLONOSCOPY WITH PROPOFOL;  Surgeon: Kerin Salen, MD;  Location: [facility];  Service: Gastroenterology;  Laterality: N/A;   KNEE SURGERY Left    THYROID SURGERY     goiter    Family History: Family History  Problem Relation Age of Onset   Cancer Mother        cervical- mets   Breast cancer Mother    Cancer Father    Heart disease Father    Diabetes Father    Diabetes Sister    Breast cancer Sister    Heart disease  Sister    Breast cancer Sister    Heart disease Sister    Breast cancer Sister    Breast cancer Sister    COPD Sister    Breast cancer Sister    Colon cancer Neg Hx    Esophageal cancer Neg Hx    Rectal cancer Neg Hx    Stomach cancer Neg Hx     Social History  reports that she has never smoked. She has never used smokeless tobacco. She reports that she does not drink alcohol and does not use drugs.  Allergies  Allergen Reactions   Amoxicillin-Pot Clavulanate Nausea And Vomiting and Other (See Comments)    Combination of medications tear lining of stomach  Other Reaction(s): GI Intolerance, Not available   Oxycodone Shortness Of Breath    oxycontin too   Sulfa Antibiotics Shortness Of Breath   Amoxicillin     Other Reaction(s): Unknown   Norvasc [Amlodipine Besylate] Swelling    feet   Penicillins    Sulfamethoxazole Nausea Only and Other (See Comments)    Other Reaction(s): GI Intolerance   Tramadol Other (See Comments)    Unknown, toxicity. Patient advised she had to be given Narcan with this    Medications  No current  facility-administered medications for this encounter.  Current Outpatient Medications:    Accu-Chek FastClix Lancets MISC, USE AS DIRECTED TO CHECK BLOOD SUGAR THREE TIMES DAILY, Disp: 306 each, Rfl: 0   ACCU-CHEK GUIDE test strip, USE AS DIRECTED TO CHECK BLOOD SUGAR THREE TIMES DAILY, Disp: 100 strip, Rfl: 0   albuterol (ACCUNEB) 0.63 MG/3ML nebulizer solution, Take 1 ampule by nebulization 3 (three) times daily as needed for wheezing or shortness of breath., Disp: , Rfl:    allopurinol (ZYLOPRIM) 100 MG tablet, Take 100 mg by mouth daily., Disp: , Rfl:    alum & mag hydroxide-simeth (MAALOX MAX) 400-400-40 MG/5ML suspension, Take 15 mLs by mouth every 6 (six) hours as needed for indigestion., Disp: 355 mL, Rfl: 0   aspirin EC 81 MG tablet, Take 81 mg by mouth daily., Disp: , Rfl:    Blood Glucose Monitoring Suppl (ACCU-CHEK AVIVA PLUS) w/Device KIT, USE AS DIRECTED TO TEST TWICE DAILY, Disp: 1 kit, Rfl: 0   Budeson-Glycopyrrol-Formoterol (BREZTRI AEROSPHERE) 160-9-4.8 MCG/ACT AERO, Inhale 2 puffs into the lungs in the morning and at bedtime., Disp: , Rfl:    carvedilol (COREG) 12.5 MG tablet, Take 1.5 tablets (18.75 mg total) by mouth 2 (two) times daily with a meal., Disp: 270 tablet, Rfl: 3   cetirizine (ZYRTEC) 10 MG tablet, Take 10 mg by mouth daily as needed for allergies., Disp: , Rfl:    DEXILANT 60 MG capsule, TAKE ONE CAPSULE BY MOUTH DAILY (Patient taking differently: Take 60 mg by mouth daily.), Disp: 90 capsule, Rfl: 0   diclofenac sodium (VOLTAREN) 1 % GEL, Apply 4 g topically 4 (four) times daily. (Patient taking differently: Apply 4 g topically daily as needed (pain).), Disp: 100 g, Rfl: 0   empagliflozin (JARDIANCE) 10 MG TABS tablet, Take 10 mg by mouth daily., Disp: , Rfl:    ezetimibe (ZETIA) 10 MG tablet, Take 10 mg by mouth every evening., Disp: , Rfl:    FARXIGA 10 MG TABS tablet, Take 10 mg by mouth daily., Disp: , Rfl:    feeding supplement, GLUCERNA SHAKE, (GLUCERNA  SHAKE) LIQD, Take 237 mLs by mouth 3 (three) times daily between meals., Disp: , Rfl:    fluticasone (FLONASE) 50 MCG/ACT nasal spray, PLACE  2 SPRAYS IN EACH NOSTRIL DAILY AS NEEDED FOR ALLERGIES, Disp: 48 g, Rfl: 0   gabapentin (NEURONTIN) 300 MG capsule, TAKE 2 CAPSULES BY MOUTH TWICE DAILY, Disp: 120 capsule, Rfl: 2   insulin aspart (NOVOLOG FLEXPEN) 100 UNIT/ML FlexPen, Inject 60 Units into the skin 3 (three) times daily with meals. (Patient taking differently: Inject 65 Units into the skin 3 (three) times daily with meals.), Disp: 45 mL, Rfl: 2   Insulin Glargine (LANTUS SOLOSTAR) 100 UNIT/ML Solostar Pen, Inject 84 Units into the skin daily. (Patient taking differently: Inject 86 Units into the skin daily.), Disp: 10 pen, Rfl: 11   Insulin Pen Needle (TRUEPLUS PEN NEEDLES) 32G X 4 MM MISC, Use to inject insulin., Disp: 100 each, Rfl: 11   levothyroxine (SYNTHROID) 100 MCG tablet, Take 1 tablet (100 mcg total) by mouth daily., Disp: 30 tablet, Rfl: 6   lidocaine (LIDODERM) 5 %, Place 1 patch onto the skin every 12 (twelve) hours. Remove & Discard patch within 12 hours or as directed by MD, Disp: 30 patch, Rfl: 2   lisinopril (ZESTRIL) 40 MG tablet, TAKE 1 TABLET BY MOUTH DAILY, Disp: 90 tablet, Rfl: 3   LORazepam (ATIVAN) 0.5 MG tablet, Take 0.5 mg by mouth 2 (two) times daily as needed for anxiety., Disp: , Rfl:    meloxicam (MOBIC) 15 MG tablet, Take 1 tablet (15 mg total) by mouth daily as needed., Disp: 30 tablet, Rfl: 1   metroNIDAZOLE (FLAGYL) 500 MG tablet, Take 1 tablet (500 mg total) by mouth 2 (two) times daily. (Patient not taking: No sig reported), Disp: 14 tablet, Rfl: 0   mometasone (NASONEX) 50 MCG/ACT nasal spray, Place 2 sprays into the nose daily. (Patient taking differently: Place 2 sprays into the nose daily as needed (allergies).), Disp: 17 g, Rfl: 3   NOVOFINE 32G X 6 MM MISC, SMARTSIG:1 SUB-Q 4-5 Times Daily, Disp: , Rfl:    NYSTATIN powder, APPLY TOPICALLY 4 TIMES DAILY.  (Patient taking differently: Apply 1 Bottle topically 4 (four) times daily as needed (rash).), Disp: 15 g, Rfl: 0   Omega-3 Fatty Acids (OMEGA-3 FISH OIL PO), Take 1 capsule by mouth in the morning and at bedtime., Disp: , Rfl:    omeprazole (PRILOSEC) 20 MG capsule, Take 20 mg by mouth daily., Disp: , Rfl:    ondansetron (ZOFRAN) 4 MG tablet, Take 1 tablet (4 mg total) by mouth every 6 (six) hours. (Patient taking differently: Take 4 mg by mouth every 6 (six) hours as needed for nausea or vomiting.), Disp: 12 tablet, Rfl: 0   ondansetron (ZOFRAN-ODT) 4 MG disintegrating tablet, Take 1 tablet (4 mg total) by mouth every 8 (eight) hours as needed for nausea or vomiting., Disp: 12 tablet, Rfl: 0   oxyCODONE (ROXICODONE) 5 MG immediate release tablet, Take 0.5 tablets (2.5 mg total) by mouth every 6 (six) hours as needed for severe pain (pain score 7-10)., Disp: 8 tablet, Rfl: 0   pantoprazole (PROTONIX) 40 MG tablet, Take 1 tablet (40 mg total) by mouth daily., Disp: 30 tablet, Rfl: 0   pioglitazone (ACTOS) 30 MG tablet, Take 30 mg by mouth daily., Disp: , Rfl:    Pitavastatin Calcium (LIVALO) 4 MG TABS, Take 1 tablet by mouth at bedtime., Disp: , Rfl:    pregabalin (LYRICA) 75 MG capsule, Take 75 mg by mouth 2 (two) times daily., Disp: , Rfl:    Semaglutide,0.25 or 0.5MG /DOS, (OZEMPIC, 0.25 OR 0.5 MG/DOSE,) 2 MG/1.5ML SOPN, Inject 0.25 mg into the  skin every Friday., Disp: , Rfl:    torsemide (DEMADEX) 20 MG tablet, Take 20 mg by mouth daily as needed (fluid)., Disp: , Rfl:    triamcinolone ointment (KENALOG) 0.1 %, Apply 1 application topically 2 (two) times daily. To affected areas (Patient taking differently: Apply 1 application  topically daily. To affected areas), Disp: 60 g, Rfl: 1   valACYclovir (VALTREX) 1000 MG tablet, Take 1 tablet (1,000 mg total) by mouth 2 (two) times daily. (Patient not taking: No sig reported), Disp: 14 tablet, Rfl: 0   Vitamin D, Ergocalciferol, (DRISDOL) 1.25 MG (50000  UNIT) CAPS capsule, Take 50,000 Units by mouth every Wednesday., Disp: , Rfl:    Zinc Acetate 50 MG CAPS, Take 1 capsule by mouth in the morning and at bedtime., Disp: , Rfl:   Vitals   Vitals:   11/21/23 2200  Weight: (!) 153.8 kg    Body mass index is 54.73 kg/m.  Physical Exam   General: Laying comfortably in bed; in no acute distress.  HENT: Normal oropharynx and mucosa. Normal external appearance of ears and nose.  Neck: Supple, no pain or tenderness  CV: No JVD. No peripheral edema.  Pulmonary: Symmetric Chest rise. Normal respiratory effort.  Abdomen: Soft to touch, non-tender.  Ext: No cyanosis, edema, or deformity  Skin: No rash. Normal palpation of skin.   Musculoskeletal: Normal digits and nails by inspection. No clubbing.   Neurologic Examination  Mental status/Cognition: Alert, oriented to self, place, month and year, good attention.  Speech/language: Fluent, comprehension intact, object naming intact, repetition intact.  Cranial nerves:   CN II Pupils equal and reactive to light, no VF deficits    CN III,IV,VI EOM intact, no gaze preference or deviation, no nystagmus    CN V normal sensation in V1, V2, and V3 segments bilaterally    CN VII no asymmetry, no nasolabial fold flattening    CN VIII normal hearing to speech    CN IX & X normal palatal elevation, no uvular deviation    CN XI 5/5 head turn and 5/5 shoulder shrug bilaterally    CN XII midline tongue protrusion    Motor:  Muscle bulk: normal, tone normal, pronator drift none tremor none Mvmt Root Nerve  Muscle Right Left Comments  SA C5/6 Ax Deltoid 5 5   EF C5/6 Mc Biceps 5 5   EE C6/7/8 Rad Triceps 5 5   WF C6/7 Med FCR     WE C7/8 PIN ECU     F Ab C8/T1 U ADM/FDI 5 5   HF L1/2/3 Fem Illopsoas 4 4   KE L2/3/4 Fem Quad   BL knee pain.  DF L4/5 D Peron Tib Ant 5 5   PF S1/2 Tibial Grc/Sol 5 5    Sensation:  Light touch Absent in L forearm and hand, reduced in L leg.   Pin prick    Temperature     Vibration   Proprioception    Coordination/Complex Motor:  - Finger to Nose intact BL - Heel to shin unable to do 2/2 pain. - Rapid alternating movement are slowed throughout - Gait: deferred.  Labs/Imaging/Neurodiagnostic studies   CBC: No results for input(s): "WBC", "NEUTROABS", "HGB", "HCT", "MCV", "PLT" in the last 168 hours. Basic Metabolic Panel:  Lab Results  Component Value Date   NA 138 04/30/2023   K 3.5 04/30/2023   CO2 27 04/30/2023   GLUCOSE 236 (H) 04/30/2023   BUN 25 (H) 04/30/2023   CREATININE 1.35 (  H) 04/30/2023   CALCIUM 9.6 04/30/2023   GFRNONAA 43 (L) 04/30/2023   GFRAA 78 05/09/2019   Lipid Panel:  Lab Results  Component Value Date   LDLCALC 40 11/25/2016   HgbA1c:  Lab Results  Component Value Date   HGBA1C 6.5 05/09/2019   Urine Drug Screen: No results found for: "LABOPIA", "COCAINSCRNUR", "LABBENZ", "AMPHETMU", "THCU", "LABBARB"  Alcohol Level     Component Value Date/Time   [facility]  07/22/2007 2045    <5        LOWEST DETECTABLE LIMIT FOR SERUM ALCOHOL IS 11 mg/dL FOR MEDICAL PURPOSES ONLY   INR  Lab Results  Component Value Date   INR 1.0 04/30/2023   APTT  Lab Results  Component Value Date   APTT 30 07/24/2011   AED levels: No results found for: "PHENYTOIN", "ZONISAMIDE", "LAMOTRIGINE", "LEVETIRACETA"  CT Head without contrast(Personally reviewed): CTH was negative for a large hypodensity concerning for a large territory infarct or hyperdensity concerning for an ICH  CT angio Head and Neck with contrast: pending  MRI Brain: Pending  MR C spine w/o contrast: pending   ASSESSMENT   Anita James is a 73 y.o. female with hx of CHF, DM2, HLD, morbid obesity, HTN, hypothyroidism who was watching TV and had sudden onset numbness in her left hand and slight left leg numbness with no facial involvement.  Suspect small stroke/TIA. Given there was no facial involvement, will get MR C spine in addition to standard stroke workup.  However, no smptoms of cord compression, would not expect radioculopathy symptoms to occur for the first time at rest with only sensory symptoms and no motor weakness in her left hand.  RECOMMENDATIONS  - Frequent Neuro checks per stroke unit protocol - Recommend brain imaging with MRI Brain without contrast - Recommend Vascular imaging with CTA head and neck - Recommend obtaining TTE - Recommend obtaining Lipid panel with LDL - Please start statin if LDL > 70 - Recommend HbA1c to evaluate for diabetes and how well it is controlled. - Antithrombotic - Aspirin 81mg  daily along with plavix 75mg  daily x 21 days, followed by Aspirin 81mg  daily alone. - Recommend DVT ppx - SBP goal - permissive hypertension first 24 h < 220/110. Held home meds.  - Recommend Telemetry monitoring for arrythmia - Recommend bedside swallow screen prior to PO intake. - Stroke education booklet - Recommend PT/OT/SLP consult - MR cervical spine w/o contrast. ______________________________________________________________________    Welton Flakes, MD [facility]

## 2023-11-21 NOTE — Code Documentation (Signed)
 Responded to Code Stroke called at 2149 for L arm numbness, LSN-2015. Pt arrived at 2206, CBG-168, NIH-4 for L arm sensory deficit and BLE drift, CT head negative for acute changes. TNK not given-symptoms too mild. Pt remains in TNK window until 0045. Please complete VS/neuro checks q30 min until then, then q2h x 12, then q4h.

## 2023-11-21 NOTE — ED Triage Notes (Signed)
 Pt BIB EMS from home as code stroke. LKN 2015 this evening, only symptom is sensory deficits to L arm. Alert and oriented x4. BG 157, 18g LFA.

## 2023-11-21 NOTE — ED Notes (Signed)
 CCMD notified via telephone.

## 2023-11-21 NOTE — ED Provider Notes (Incomplete)
 [place] EMERGENCY DEPARTMENT AT [facility] Provider Note   CSN: [id number] Arrival date & time: 11/21/23  2206  An emergency department physician performed an initial assessment on this suspected stroke patient at 2207.  History {Add pertinent medical, surgical, social history, OB history to HPI:1} Chief Complaint  Patient presents with   Code Stroke    Anita James is a 73 y.o. female.  The history is provided by the patient and medical records.   73 year old female with history of chronic pain, CHF, hypertension, GERD, hyperlipidemia, sleep apnea, arthritis, diabetes, presenting to the ED as a code stroke.  Last known well 815 this evening.  She reports mostly sensation of numbness and tingling to her left arm.  Seems to stem from left middle finger up until her elbow and then has some "tingling" on underside of her upper arm.  She denies weakness of this arm.  She denies headaches, blurred vision, aphasia, or confusion.  No prior hx of TIA or stroke previously.  Home Medications Prior to Admission medications   Medication Sig Start Date End Date Taking? Authorizing Provider  Accu-Chek FastClix Lancets MISC USE AS DIRECTED TO CHECK BLOOD SUGAR THREE TIMES DAILY 11/07/19   Marcine Matar, MD  ACCU-CHEK GUIDE test strip USE AS DIRECTED TO CHECK BLOOD SUGAR THREE TIMES DAILY 10/04/19   Marcine Matar, MD  albuterol (ACCUNEB) 0.63 MG/3ML nebulizer solution Take 1 ampule by nebulization 3 (three) times daily as needed for wheezing or shortness of breath. 01/07/21   [provider]  allopurinol (ZYLOPRIM) 100 MG tablet Take 100 mg by mouth daily.    [provider]  alum & mag hydroxide-simeth (MAALOX MAX) 400-400-40 MG/5ML suspension Take 15 mLs by mouth every 6 (six) hours as needed for indigestion. 07/22/22   Kommor, Wyn Forster, MD  aspirin EC 81 MG tablet Take 81 mg by mouth daily. 11/20/16   [provider]  Blood Glucose Monitoring Suppl  (ACCU-CHEK AVIVA PLUS) w/Device KIT USE AS DIRECTED TO TEST TWICE DAILY 04/27/18   Marcine Matar, MD  Budeson-Glycopyrrol-Formoterol (BREZTRI AEROSPHERE) 160-9-4.8 MCG/ACT AERO Inhale 2 puffs into the lungs in the morning and at bedtime.    [provider]  carvedilol (COREG) 12.5 MG tablet Take 1.5 tablets (18.75 mg total) by mouth 2 (two) times daily with a meal. 10/13/19   Marcine Matar, MD  cetirizine (ZYRTEC) 10 MG tablet Take 10 mg by mouth daily as needed for allergies.    [provider]  DEXILANT 60 MG capsule TAKE ONE CAPSULE BY MOUTH DAILY Patient taking differently: Take 60 mg by mouth daily. 01/15/20   Marcine Matar, MD  diclofenac sodium (VOLTAREN) 1 % GEL Apply 4 g topically 4 (four) times daily. Patient taking differently: Apply 4 g topically daily as needed (pain). 02/27/19   Melene Plan, DO  empagliflozin (JARDIANCE) 10 MG TABS tablet Take 10 mg by mouth daily.    [provider]  ezetimibe (ZETIA) 10 MG tablet Take 10 mg by mouth every evening. 02/05/20   [provider]  FARXIGA 10 MG TABS tablet Take 10 mg by mouth daily. 03/16/20   [provider]  feeding supplement, GLUCERNA SHAKE, (GLUCERNA SHAKE) LIQD Take 237 mLs by mouth 3 (three) times daily between meals.    [provider]  fluticasone (FLONASE) 50 MCG/ACT nasal spray PLACE 2 SPRAYS IN EACH NOSTRIL DAILY AS NEEDED FOR ALLERGIES 07/21/19   Marcine Matar, MD  gabapentin (NEURONTIN) 300 MG  capsule TAKE 2 CAPSULES BY MOUTH TWICE DAILY 08/01/20   Marcine Matar, MD  insulin aspart (NOVOLOG FLEXPEN) 100 UNIT/ML FlexPen Inject 60 Units into the skin 3 (three) times daily with meals. Patient taking differently: Inject 65 Units into the skin 3 (three) times daily with meals. 12/05/19   Marcine Matar, MD  Insulin Glargine (LANTUS SOLOSTAR) 100 UNIT/ML Solostar Pen Inject 84 Units into the skin daily. Patient taking differently: Inject 86 Units into the skin  daily. 10/13/19   Marcine Matar, MD  Insulin Pen Needle (TRUEPLUS PEN NEEDLES) 32G X 4 MM MISC Use to inject insulin. 10/13/18   Marcine Matar, MD  levothyroxine (SYNTHROID) 100 MCG tablet Take 1 tablet (100 mcg total) by mouth daily. 10/13/19   Marcine Matar, MD  lidocaine (LIDODERM) 5 % Place 1 patch onto the skin every 12 (twelve) hours. Remove & Discard patch within 12 hours or as directed by MD 10/19/23   Burna Forts, MD  lisinopril (ZESTRIL) 40 MG tablet TAKE 1 TABLET BY MOUTH DAILY 11/10/19   Marcine Matar, MD  LORazepam (ATIVAN) 0.5 MG tablet Take 0.5 mg by mouth 2 (two) times daily as needed for anxiety. 12/17/20   [provider]  meloxicam (MOBIC) 15 MG tablet Take 1 tablet (15 mg total) by mouth daily as needed. 04/02/23   Hudnall, Azucena Fallen, MD  metroNIDAZOLE (FLAGYL) 500 MG tablet Take 1 tablet (500 mg total) by mouth 2 (two) times daily. Patient not taking: No sig reported 03/04/21   Lorre Nick, MD  mometasone (NASONEX) 50 MCG/ACT nasal spray Place 2 sprays into the nose daily. Patient taking differently: Place 2 sprays into the nose daily as needed (allergies). 08/24/17   Marcine Matar, MD  NOVOFINE 32G X 6 MM MISC SMARTSIG:1 SUB-Q 4-5 Times Daily 01/22/20   [provider]  NYSTATIN powder APPLY TOPICALLY 4 TIMES DAILY. Patient taking differently: Apply 1 Bottle topically 4 (four) times daily as needed (rash). 01/18/17   Pete Glatter, MD  Omega-3 Fatty Acids (OMEGA-3 FISH OIL PO) Take 1 capsule by mouth in the morning and at bedtime.    [provider]  omeprazole (PRILOSEC) 20 MG capsule Take 20 mg by mouth daily.    [provider]  ondansetron (ZOFRAN) 4 MG tablet Take 1 tablet (4 mg total) by mouth every 6 (six) hours. Patient taking differently: Take 4 mg by mouth every 6 (six) hours as needed for nausea or vomiting. 09/16/18   Carlyle Basques P, PA-C  ondansetron (ZOFRAN-ODT) 4 MG disintegrating tablet Take 1 tablet (4  mg total) by mouth every 8 (eight) hours as needed for nausea or vomiting. 05/01/23   Tilden Fossa, MD  oxyCODONE (ROXICODONE) 5 MG immediate release tablet Take 0.5 tablets (2.5 mg total) by mouth every 6 (six) hours as needed for severe pain (pain score 7-10). 09/19/23   Sherian Maroon A, PA  pantoprazole (PROTONIX) 40 MG tablet Take 1 tablet (40 mg total) by mouth daily. 05/01/23   Tilden Fossa, MD  pioglitazone (ACTOS) 30 MG tablet Take 30 mg by mouth daily. 01/07/21   [provider]  Pitavastatin Calcium (LIVALO) 4 MG TABS Take 1 tablet by mouth at bedtime.    [provider]  pregabalin (LYRICA) 75 MG capsule Take 75 mg by mouth 2 (two) times daily.    [provider]  Semaglutide,0.25 or 0.5MG /DOS, (OZEMPIC, 0.25 OR 0.5 MG/DOSE,) 2 MG/1.5ML SOPN Inject 0.25 mg into the skin  every Friday.    [provider]  torsemide (DEMADEX) 20 MG tablet Take 20 mg by mouth daily as needed (fluid). 05/09/18   [provider]  triamcinolone ointment (KENALOG) 0.1 % Apply 1 application topically 2 (two) times daily. To affected areas Patient taking differently: Apply 1 application  topically daily. To affected areas 12/31/20   Myra Rude, MD  valACYclovir (VALTREX) 1000 MG tablet Take 1 tablet (1,000 mg total) by mouth 2 (two) times daily. Patient not taking: No sig reported 05/09/19   Hoy Register, MD  Vitamin D, Ergocalciferol, (DRISDOL) 1.25 MG (50000 UNIT) CAPS capsule Take 50,000 Units by mouth every Wednesday.    [provider]  Zinc Acetate 50 MG CAPS Take 1 capsule by mouth in the morning and at bedtime.    [provider]      Allergies    Amoxicillin-pot clavulanate, Oxycodone, Sulfa antibiotics, Amoxicillin, Norvasc [amlodipine besylate], Penicillins, Sulfamethoxazole, and Tramadol    Review of Systems   Review of Systems  Neurological:  Positive for numbness.  All other systems reviewed and are negative.   Physical  Exam Updated Vital Signs BP (!) 179/86   Pulse 81   Temp 97.8 F (36.6 C) (Oral)   Resp 18   Wt (!) 153.8 kg   SpO2 93%   BMI 54.73 kg/m   Physical Exam Vitals and nursing note reviewed.  Constitutional:      Appearance: She is well-developed.  HENT:     Head: Normocephalic and atraumatic.  Eyes:     Conjunctiva/sclera: Conjunctivae normal.     Pupils: Pupils are equal, round, and reactive to light.  Cardiovascular:     Rate and Rhythm: Normal rate and regular rhythm.     Heart sounds: Normal heart sounds.  Pulmonary:     Effort: Pulmonary effort is normal. No respiratory distress.     Breath sounds: Normal breath sounds. No rhonchi.  Abdominal:     General: Bowel sounds are normal.     Palpations: Abdomen is soft.     Tenderness: There is no abdominal tenderness. There is no rebound.  Musculoskeletal:        General: Normal range of motion.     Cervical back: Normal range of motion.  Skin:    General: Skin is warm and dry.  Neurological:     Mental Status: She is alert and oriented to person, place, and time.     Comments: AAOx3, speech is clear and goal oriented; moving extremities well without dense focal deficit, some decreases sensation to left middle finger, forearm, etc but able to grip as normal; no facial asymmetry noted     ED Results / Procedures / Treatments   Labs (all labs ordered are listed, but only abnormal results are displayed) Labs Reviewed  I-STAT CHEM 8, ED - Abnormal; Notable for the following components:      Result Value   Glucose, Bld 168 (*)    Calcium, Ion 1.07 (*)    All other components within normal limits  CBG MONITORING, ED - Abnormal; Notable for the following components:   Glucose-Capillary 168 (*)    All other components within normal limits  I-STAT CG4 LACTIC ACID, ED - Abnormal; Notable for the following components:   Lactic Acid, Venous 2.0 (*)    All other components within normal limits  PROTIME-INR  APTT  CBC   DIFFERENTIAL  ETHANOL  COMPREHENSIVE METABOLIC PANEL  RAPID URINE DRUG SCREEN, HOSP PERFORMED  URINALYSIS,  ROUTINE W REFLEX MICROSCOPIC    EKG None  Radiology CT HEAD CODE STROKE WO CONTRAST Result Date: 11/21/2023 CLINICAL DATA:  Code stroke. Initial evaluation for acute neuro deficit, left-sided numbness. EXAM: CT HEAD WITHOUT CONTRAST TECHNIQUE: Contiguous axial images were obtained from the base of the skull through the vertex without intravenous contrast. RADIATION DOSE REDUCTION: This exam was performed according to the departmental dose-optimization program which includes automated exposure control, adjustment of the mA and/or kV according to patient size and/or use of iterative reconstruction technique. COMPARISON:  Prior study from 04/09/2020 FINDINGS: Brain: Cerebral volume within normal limits. No acute intracranial hemorrhage. No acute large vessel territory infarct. No mass lesion or midline shift. No hydrocephalus. 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 Vascular: No abnormal hyperdense vessel. Calcified atherosclerosis present at the skull base. Skull: Scalp soft tissues within normal limits.  Calvarium intact. Sinuses/Orbits: Globes orbital soft tissues within normal limits. Chronic right sphenoid sinus disease noted. Paranasal sinuses are otherwise clear. No significant mastoid effusion. Other: None. ASPECTS [facility] Stroke Program Early CT Score) - Ganglionic level infarction (caudate, lentiform nuclei, internal capsule, insula, M1-M3 cortex): 7 - Supraganglionic infarction (M4-M6 cortex): 3 Total score (0-10 with 10 being normal): 10 IMPRESSION: 1. No acute intracranial abnormality. 2. ASPECTS is 10. 3. 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 No significant mass effect or midline shift. These results were communicated to Dr. Derry Lory at 10:26 pm on  11/21/2023 by text page via the [facility] messaging system. Electronically Signed   By: Rise Mu M.D.   On: 11/21/2023 22:28    Procedures Procedures  {Document cardiac monitor, telemetry assessment procedure when appropriate:1}  Medications Ordered in ED Medications - No data to display  ED Course/ Medical Decision Making/ A&P   {   Click here for ABCD2, HEART and other calculatorsREFRESH Note before signing :1}                              Medical Decision Making Amount and/or Complexity of Data Reviewed Labs: ordered. Radiology: ordered and independent interpretation performed. ECG/medicine tests: ordered and independent interpretation performed.   73 y.o. F presenting to the ED as a code stroke.  LKW 2015.  Has mainly sensory deficits to left arm involving middle finger, left forearm, and underside of left upper arm.  No real weakness to speak off.  Speech is very clear and goal oriented.  No facial droop.  NIH 2.    Neurology, Dr. Derry Lory, has evaluated patient on arrival-- high risk for stroke with risk factors.  Symptoms mild at present, tPA not indicated based on arrival exam.  tPA window available until 0045, will monitor until then and if worsening will administer tPA, if not then will admit to hospitalist for ongoing stroke work-up.  Final Clinical Impression(s) / ED Diagnoses Final diagnoses:  None    Rx / DC Orders ED Discharge Orders     None

## 2023-11-22 ENCOUNTER — Emergency Department (HOSPITAL_COMMUNITY)

## 2023-11-22 ENCOUNTER — Observation Stay (HOSPITAL_COMMUNITY)

## 2023-11-22 ENCOUNTER — Encounter (HOSPITAL_COMMUNITY): Payer: Self-pay | Admitting: Internal Medicine

## 2023-11-22 ENCOUNTER — Observation Stay (HOSPITAL_BASED_OUTPATIENT_CLINIC_OR_DEPARTMENT_OTHER)

## 2023-11-22 DIAGNOSIS — R32 Unspecified urinary incontinence: Secondary | ICD-10-CM | POA: Insufficient documentation

## 2023-11-22 DIAGNOSIS — R200 Anesthesia of skin: Secondary | ICD-10-CM | POA: Diagnosis not present

## 2023-11-22 DIAGNOSIS — G609 Hereditary and idiopathic neuropathy, unspecified: Secondary | ICD-10-CM

## 2023-11-22 DIAGNOSIS — I10 Essential (primary) hypertension: Secondary | ICD-10-CM

## 2023-11-22 DIAGNOSIS — M1A00X0 Idiopathic chronic gout, unspecified site, without tophus (tophi): Secondary | ICD-10-CM

## 2023-11-22 DIAGNOSIS — Z794 Long term (current) use of insulin: Secondary | ICD-10-CM

## 2023-11-22 DIAGNOSIS — M47892 Other spondylosis, cervical region: Secondary | ICD-10-CM

## 2023-11-22 DIAGNOSIS — M4802 Spinal stenosis, cervical region: Secondary | ICD-10-CM

## 2023-11-22 DIAGNOSIS — R7989 Other specified abnormal findings of blood chemistry: Secondary | ICD-10-CM | POA: Diagnosis not present

## 2023-11-22 DIAGNOSIS — G4733 Obstructive sleep apnea (adult) (pediatric): Secondary | ICD-10-CM

## 2023-11-22 DIAGNOSIS — E038 Other specified hypothyroidism: Secondary | ICD-10-CM

## 2023-11-22 DIAGNOSIS — E119 Type 2 diabetes mellitus without complications: Secondary | ICD-10-CM

## 2023-11-22 DIAGNOSIS — I5032 Chronic diastolic (congestive) heart failure: Secondary | ICD-10-CM | POA: Diagnosis not present

## 2023-11-22 DIAGNOSIS — R202 Paresthesia of skin: Secondary | ICD-10-CM

## 2023-11-22 DIAGNOSIS — M1A9XX0 Chronic gout, unspecified, without tophus (tophi): Secondary | ICD-10-CM | POA: Insufficient documentation

## 2023-11-22 DIAGNOSIS — G629 Polyneuropathy, unspecified: Secondary | ICD-10-CM

## 2023-11-22 DIAGNOSIS — J4520 Mild intermittent asthma, uncomplicated: Secondary | ICD-10-CM

## 2023-11-22 DIAGNOSIS — J45909 Unspecified asthma, uncomplicated: Secondary | ICD-10-CM | POA: Insufficient documentation

## 2023-11-22 LAB — ECHOCARDIOGRAM COMPLETE
Area-P 1/2: 2.84 cm2
Height: 66 in
S' Lateral: 3.2 cm
Weight: 5425.08 [oz_av]

## 2023-11-22 LAB — LIPID PANEL
Cholesterol: 183 mg/dL (ref 0–200)
HDL: 32 mg/dL — ABNORMAL LOW (ref >40)
LDL Cholesterol: 103 mg/dL — ABNORMAL HIGH (ref 0–99)
Total CHOL/HDL Ratio: 5.7 ratio
Triglycerides: 240 mg/dL — ABNORMAL HIGH (ref <150)
VLDL: 48 mg/dL — ABNORMAL HIGH (ref 0–40)

## 2023-11-22 LAB — GLUCOSE, CAPILLARY
Glucose-Capillary: 141 mg/dL — ABNORMAL HIGH (ref 70–99)
Glucose-Capillary: 148 mg/dL — ABNORMAL HIGH (ref 70–99)

## 2023-11-22 LAB — CBG MONITORING, ED
Glucose-Capillary: 146 mg/dL — ABNORMAL HIGH (ref 70–99)
Glucose-Capillary: 168 mg/dL — ABNORMAL HIGH (ref 70–99)

## 2023-11-22 LAB — HEMOGLOBIN A1C
Hgb A1c MFr Bld: 6.7 % — ABNORMAL HIGH (ref 4.8–5.6)
Mean Plasma Glucose: 145.59 mg/dL

## 2023-11-22 LAB — URINALYSIS, ROUTINE W REFLEX MICROSCOPIC
Bilirubin Urine: NEGATIVE
Glucose, UA: >=500 mg/dL — AB
Hgb urine dipstick: NEGATIVE
Ketones, ur: NEGATIVE mg/dL
Nitrite: NEGATIVE
Protein, ur: 100 mg/dL — AB
Specific Gravity, Urine: 1.02 (ref 1.005–1.030)
pH: 6 (ref 5.0–8.0)

## 2023-11-22 LAB — RAPID URINE DRUG SCREEN, HOSP PERFORMED
Amphetamines: NOT DETECTED
Barbiturates: NOT DETECTED
Benzodiazepines: NOT DETECTED
Cocaine: NOT DETECTED
Opiates: NOT DETECTED
Tetrahydrocannabinol: NOT DETECTED

## 2023-11-22 LAB — URINALYSIS, MICROSCOPIC (REFLEX)

## 2023-11-22 LAB — HIV ANTIBODY (ROUTINE TESTING W REFLEX): HIV Screen 4th Generation wRfx: NONREACTIVE

## 2023-11-22 MED ORDER — HYDRALAZINE HCL 20 MG/ML IJ SOLN: 10.0000 mg | Freq: Four times a day (QID) | INTRAMUSCULAR | Status: AC | PRN

## 2023-11-22 MED ORDER — BUDESON-GLYCOPYRROL-FORMOTEROL 160-9-4.8 MCG/ACT IN AERO: 2.0000 | INHALATION_SPRAY | Freq: Two times a day (BID) | RESPIRATORY_TRACT | Status: DC

## 2023-11-22 MED ORDER — IOHEXOL 350 MG/ML SOLN
75.0000 mL | Freq: Once | INTRAVENOUS | Status: AC | PRN
  Administered 2023-11-22: 75 mL via INTRAVENOUS

## 2023-11-22 MED ORDER — INSULIN GLARGINE 100 UNIT/ML ~~LOC~~ SOLN
84.0000 [IU] | Freq: Every day | SUBCUTANEOUS | Status: DC
  Administered 2023-11-22 – 2023-11-23 (×2): 84 [IU] via SUBCUTANEOUS
  Filled 2023-11-22 (×2): qty 0.84

## 2023-11-22 MED ORDER — STROKE: EARLY STAGES OF RECOVERY BOOK
Freq: Once | Status: AC
  Filled 2023-11-22: qty 1

## 2023-11-22 MED ORDER — UMECLIDINIUM BROMIDE 62.5 MCG/ACT IN AEPB
1.0000 | INHALATION_SPRAY | Freq: Every day | RESPIRATORY_TRACT | Status: DC
Start: 2023-11-22 — End: 2023-11-23
  Administered 2023-11-23: 1 via RESPIRATORY_TRACT
  Filled 2023-11-22: qty 7

## 2023-11-22 MED ORDER — TORSEMIDE 20 MG PO TABS
20.0000 mg | ORAL_TABLET | Freq: Every day | ORAL | Status: DC
  Administered 2023-11-22 – 2023-11-23 (×2): 20 mg via ORAL
  Filled 2023-11-22 (×3): qty 1

## 2023-11-22 MED ORDER — LEVOTHYROXINE SODIUM 100 MCG PO TABS
100.0000 ug | ORAL_TABLET | Freq: Every day | ORAL | Status: DC
  Administered 2023-11-22 – 2023-11-23 (×2): 100 ug via ORAL
  Filled 2023-11-22 (×2): qty 1

## 2023-11-22 MED ORDER — CLOPIDOGREL BISULFATE 75 MG PO TABS
75.0000 mg | ORAL_TABLET | Freq: Every day | ORAL | Status: DC
Start: 2023-11-22 — End: 2023-11-22
  Administered 2023-11-22: 75 mg via ORAL
  Filled 2023-11-22: qty 1

## 2023-11-22 MED ORDER — TORSEMIDE 20 MG PO TABS: 20.0000 mg | ORAL_TABLET | Freq: Every day | ORAL | Status: DC | PRN

## 2023-11-22 MED ORDER — HEPARIN SODIUM (PORCINE) 5000 UNIT/ML IJ SOLN
5000.0000 [IU] | Freq: Three times a day (TID) | INTRAMUSCULAR | Status: DC
  Administered 2023-11-22 – 2023-11-23 (×4): 5000 [IU] via SUBCUTANEOUS
  Filled 2023-11-22 (×4): qty 1

## 2023-11-22 MED ORDER — LORATADINE 10 MG PO TABS
10.0000 mg | ORAL_TABLET | Freq: Every day | ORAL | Status: DC
  Administered 2023-11-22 – 2023-11-23 (×2): 10 mg via ORAL
  Filled 2023-11-22 (×2): qty 1

## 2023-11-22 MED ORDER — INSULIN ASPART 100 UNIT/ML IJ SOLN
0.0000 [IU] | Freq: Three times a day (TID) | INTRAMUSCULAR | Status: DC
  Administered 2023-11-22: 3 [IU] via SUBCUTANEOUS
  Administered 2023-11-22 (×2): 2 [IU] via SUBCUTANEOUS
  Administered 2023-11-23: 3 [IU] via SUBCUTANEOUS

## 2023-11-22 MED ORDER — PRAVASTATIN SODIUM 40 MG PO TABS: 40.0000 mg | ORAL_TABLET | Freq: Every day | ORAL | Status: DC

## 2023-11-22 MED ORDER — SODIUM CHLORIDE 0.9% FLUSH: 3.0000 mL | INTRAVENOUS | Status: DC | PRN

## 2023-11-22 MED ORDER — ACETAMINOPHEN 325 MG PO TABS
650.0000 mg | ORAL_TABLET | ORAL | Status: DC | PRN
  Administered 2023-11-22 – 2023-11-23 (×3): 650 mg via ORAL
  Filled 2023-11-22 (×3): qty 2

## 2023-11-22 MED ORDER — CARVEDILOL 12.5 MG PO TABS
25.0000 mg | ORAL_TABLET | Freq: Two times a day (BID) | ORAL | Status: DC
  Administered 2023-11-22 – 2023-11-23 (×2): 25 mg via ORAL
  Filled 2023-11-22 (×2): qty 2

## 2023-11-22 MED ORDER — SODIUM CHLORIDE 0.9% FLUSH
3.0000 mL | Freq: Two times a day (BID) | INTRAVENOUS | Status: DC
  Administered 2023-11-22 (×2): 3 mL via INTRAVENOUS

## 2023-11-22 MED ORDER — ASPIRIN 81 MG PO TBEC
81.0000 mg | DELAYED_RELEASE_TABLET | Freq: Every day | ORAL | Status: DC
  Administered 2023-11-22 – 2023-11-23 (×2): 81 mg via ORAL
  Filled 2023-11-22 (×2): qty 1

## 2023-11-22 MED ORDER — ATORVASTATIN CALCIUM 80 MG PO TABS
80.0000 mg | ORAL_TABLET | Freq: Every day | ORAL | Status: DC
  Administered 2023-11-22: 80 mg via ORAL
  Filled 2023-11-22 (×2): qty 1

## 2023-11-22 MED ORDER — ONDANSETRON HCL 4 MG/2ML IJ SOLN
4.0000 mg | Freq: Four times a day (QID) | INTRAMUSCULAR | Status: DC | PRN
  Administered 2023-11-22: 4 mg via INTRAVENOUS
  Filled 2023-11-22: qty 2

## 2023-11-22 MED ORDER — PANTOPRAZOLE SODIUM 40 MG PO TBEC
40.0000 mg | DELAYED_RELEASE_TABLET | Freq: Every day | ORAL | Status: DC
  Administered 2023-11-22 – 2023-11-23 (×2): 40 mg via ORAL
  Filled 2023-11-22 (×2): qty 1

## 2023-11-22 MED ORDER — ALLOPURINOL 100 MG PO TABS
100.0000 mg | ORAL_TABLET | Freq: Every day | ORAL | Status: DC
  Administered 2023-11-22 – 2023-11-23 (×2): 100 mg via ORAL
  Filled 2023-11-22 (×2): qty 1

## 2023-11-22 MED ORDER — ACETAMINOPHEN 160 MG/5ML PO SOLN: 650.0000 mg | ORAL | Status: DC | PRN

## 2023-11-22 MED ORDER — ACETAMINOPHEN 650 MG RE SUPP: 650.0000 mg | RECTAL | Status: DC | PRN

## 2023-11-22 MED ORDER — FLUTICASONE FUROATE-VILANTEROL 200-25 MCG/ACT IN AEPB
1.0000 | INHALATION_SPRAY | Freq: Every day | RESPIRATORY_TRACT | Status: DC
Start: 2023-11-22 — End: 2023-11-23
  Administered 2023-11-23: 1 via RESPIRATORY_TRACT
  Filled 2023-11-22: qty 28

## 2023-11-22 MED ORDER — SENNOSIDES-DOCUSATE SODIUM 8.6-50 MG PO TABS: 1.0000 | ORAL_TABLET | Freq: Every evening | ORAL | Status: DC | PRN

## 2023-11-22 MED ORDER — INSULIN ASPART 100 UNIT/ML IJ SOLN: 0.0000 [IU] | Freq: Every day | INTRAMUSCULAR | Status: DC

## 2023-11-22 MED ORDER — PREGABALIN 75 MG PO CAPS
75.0000 mg | ORAL_CAPSULE | Freq: Two times a day (BID) | ORAL | Status: DC
  Administered 2023-11-22 – 2023-11-23 (×3): 75 mg via ORAL
  Filled 2023-11-22 (×2): qty 3
  Filled 2023-11-22 (×2): qty 1

## 2023-11-22 NOTE — Progress Notes (Addendum)
 Same day note  Anita James is a 73 y.o. female with medical history significant of essential hypertension, hypothyroidism, OSA on CPAP, morbid obesity, diastolic heart failure, peripheral neuropathy, insulin-dependent DM type II, and chronic urinary incontinence presented to the hospital as a code stroke.  She presented with numbness and tingling of the left arm and bilateral lower extremity swelling.  In the ED patient was noted to be hypotensive with elevated lactate.  CBC CMP unremarkable.  EKG showed normal sinus rhythm.  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  Neurology was consulted from the ED and was admitted to the hospital for further evaluation and treatment.    Patient seen and examined at bedside.  Patient was admitted to the hospital for numbness and tingling of the left arm.  At the time of my evaluation, patient complains of numbness on the left middle finger extending all the way to the elbow.  Denies any chest pain or shortness of breath.  Physical examination reveals obese built female.  Lower extremity edema.  Decreased sensation on the left forearm.  Laboratory data and imaging was reviewed  Assessment and Plan:  Left-sided upper extremity tingling numbness-likely neuropathy Neurology recommended admission for stroke workup.  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  Neurology recommends aspirin and statins  MRI of the brain with no acute abnormality and no mention of subdural hygroma.  Mild age-related cerebral atrophy noted.  MRI of the cervical spine with multilevel cervical spondylosis.  CT angiogram of the head and neck showed no large vessel occlusion. Obtain echocardiogram. Follow neurology recommendation.  Will get PT/OT evaluation.   Elevated lactic acid Uncertain etiology.  No fever or leukocytosis no hypotension.  Essential hypertension Chronic diastolic heart failure with preserved EF 60 to  65% -Previous 2 D echo from 05/01/2018 showing grade 1 diastolic heart failure with preserved EF 60 to 65% hold antihypertensives for now.   Chronic bilateral lower extremity swelling. On torsemide 20 daily.  History of OSA on CPAP Continue CPAP at nighttime.   Peripheral neuropathy Continue Lyrica   Insulin-dependent DM type II Home patient takes insulin Lantus 84 units long-acting insulin in the morning and NovoLog 60 unit 3 times daily with meals.   Continue while in the hospital including sliding scale insulin.  History of chronic gout Continue allopurinol   Reactive airway disease Continue Breo and Incruse Ellipta   Hypothyroidism Continue Synthroid   No Charge  Signed,  Tenny Craw, MD [facility]

## 2023-11-22 NOTE — Progress Notes (Signed)
 PT Cancellation Note  Patient Details Name: Drishti Pepperman MRN: [id number] DOB: 04-10-1951   Cancelled Treatment:    Reason Eval/Treat Not Completed: Patient at procedure or test/unavailable Pt being transported off unit for procedure. Will f/u as able.  Aleda Grana, PT, DPT 11/22/23, 10:31 AM   Sandi Mariscal 11/22/2023, 10:31 AM

## 2023-11-22 NOTE — Progress Notes (Signed)
   11/22/23 2316  BiPAP/CPAP/SIPAP  Reason BIPAP/CPAP not in use (S)  Nausea/Vomiting (pt complaining of bad nausea, placed back on 2L [place].  RT will continue to monitor)

## 2023-11-22 NOTE — Evaluation (Signed)
 Physical Therapy Evaluation Patient Details Name: Anita James MRN: [id number] DOB: [birth year] Today's Date: 11/22/2023  History of Present Illness  72 y.o. female adm 3/3 with medical history significant of essential hypertension, hypothyroidism, OSA on CPAP, morbid obesity, diastolic heart failure with preserved EF, peripheral neuropathy, insulin-dependent DM type II, and chronic urinary incontinence presented to emergency department via EMS reporting numbness and tingling of the left arm.  MR Brain:No acute intracranial abnormality.  CT Neck:mild spinal  stenosis at C5-6 and C6-7 with multilevel  foraminal narrowing.  Clinical Impression  Pt seen for PT evaluation with pt agreeable, family members present. Prior to admission pt was living with daughter in a ground floor apartment with level access if she ambulates over the ground to the back entrance. Pt is mod I with rollator at home, uses motorized cart in stores, denies hx of falls. On this date, pt requires min assist for supine>sit (pt & family report pt struggles to get OOB at home at times), supervision sit>supine. Pt is able to ambulate into hallway & back with RW & CGA but gait distance limited by pt c/o feeling "unwell" but unable to elaborate/describe. Pt noted to have elevated BP & nurse made aware. Pt also c/o SOB after gait but SpO2 94% on room air; pt requesting supplemental O2 & pt placed on 2L/min via nasal cannula. Pt would benefit from ongoing acute PT services to address endurance, gait, balance.  BP checked in R wrist: Sitting EOB: 167/92 mmHg MAP 114 Sitting EOB after gait: 181/106 mmHg MAP 126 Semifowler at end of session: 190/85 mmHg MAP 114     If plan is discharge home, recommend the following: A little help with bathing/dressing/bathroom;A little help with walking and/or transfers;Assistance with cooking/housework;Help with stairs or ramp for entrance   Can travel by private vehicle        Equipment Recommendations  None recommended by PT  Recommendations for Other Services       Functional Status Assessment Patient has had a recent decline in their functional status and demonstrates the ability to make significant improvements in function in a reasonable and predictable amount of time.     Precautions / Restrictions Precautions Precautions: Fall Recall of Precautions/Restrictions: Intact Precaution/Restrictions Comments: Bone on bone to B knees Restrictions Weight Bearing Restrictions Per Provider Order: No      Mobility  Bed Mobility Overal bed mobility: Needs Assistance Bed Mobility: Supine to Sit     Supine to sit: Min assist, Used rails, HOB elevated Sit to supine: Supervision, Used rails, HOB elevated (extra time to elevate BLE onto bed)        Transfers Overall transfer level: Needs assistance Equipment used: Rolling walker (2 wheels) Transfers: Sit to/from Stand Sit to Stand: Contact guard assist           General transfer comment: STS from edge of ED stretcher    Ambulation/Gait Ambulation/Gait assistance: Contact guard assist Gait Distance (Feet): 25 Feet Assistive device: Rolling walker (2 wheels) Gait Pattern/deviations: Decreased step length - right, Decreased step length - left, Decreased stride length Gait velocity: decreased     General Gait Details: education re: proper use of RW (as pt unsure 2/2 using rollator at home)  Careers information officer     Tilt Bed    Modified Rankin (Stroke Patients Only)       Balance Overall balance assessment: Needs assistance Sitting-balance support: Feet unsupported Sitting balance-Leahy Scale:  Good     Standing balance support: During functional activity, Bilateral upper extremity supported, Reliant on assistive device for balance Standing balance-Leahy Scale: Fair                               Pertinent Vitals/Pain Pain Assessment Pain Assessment: 0-10 Pain Score: 9   Pain Location: 10/10 LUE, 8-9/10 HA Pain Descriptors / Indicators: Discomfort, Headache Pain Intervention(s): Monitored during session    Home Living Family/patient expects to be discharged to:: Private residence Living Arrangements: Children (daughter (works outside of the home during the day)) Available Help at Discharge: Family;Available 24 hours/day Type of Home: Apartment Home Access: Level entry (walks around back over ground/terrain, otherwise has 3 steps to enter)       Home Layout: One level Home Equipment: Rollator (4 wheels);Grab bars - tub/shower      Prior Function Prior Level of Function : Independent/Modified Independent             Mobility Comments: ambulatory with rollator, long distance is car<>house & pt with wheezing after walking that distance, uses motorized cart in stores, requires PRN assistance to get in/out of bed, reports she still drives ADLs Comments: notes she bathes & dresses without assistance, cleans & cooks while sitting on rollator.     Extremity/Trunk Assessment   Upper Extremity Assessment Upper Extremity Assessment: Right hand dominant;Defer to OT evaluation    Lower Extremity Assessment Lower Extremity Assessment: Overall WFL for tasks assessed (pt reports hx of "bone on bone & gout" in BLE knees, denies numbness/tingling)       Communication   Communication Communication: No apparent difficulties    Cognition Arousal: Alert Behavior During Therapy: WFL for tasks assessed/performed                             Following commands: Intact       Cueing       General Comments      Exercises     Assessment/Plan    PT Assessment Patient needs continued PT services  PT Problem List Decreased strength;Decreased activity tolerance;Decreased balance;Decreased mobility;Decreased knowledge of use of DME;Cardiopulmonary status limiting activity;Pain       PT Treatment Interventions DME instruction;Balance  training;Gait training;Neuromuscular re-education;Stair training;Functional mobility training;Therapeutic activities;Therapeutic exercise;Patient/family education    PT Goals (Current goals can be found in the Care Plan section)  Acute Rehab PT Goals Patient Stated Goal: feel better, find out what's going on PT Goal Formulation: With patient Time For Goal Achievement: 12/06/23 Potential to Achieve Goals: Good    Frequency Min 1X/week     Co-evaluation               AM-PAC PT "6 Clicks" Mobility  Outcome Measure Help needed turning from your back to your side while in a flat bed without using bedrails?: A Little Help needed moving from lying on your back to sitting on the side of a flat bed without using bedrails?: A Little Help needed moving to and from a bed to a chair (including a wheelchair)?: A Little Help needed standing up from a chair using your arms (e.g., wheelchair or bedside chair)?: A Little Help needed to walk in hospital room?: A Little Help needed climbing 3-5 steps with a railing? : A Little 6 Click Score: 18    End of Session   Activity Tolerance:  (limited 2/2 feeling  unwell) Patient left: in bed;with call bell/phone within reach;with family/visitor present Nurse Communication: Mobility status (BP) PT Visit Diagnosis: Muscle weakness (generalized) (M62.81);Other abnormalities of gait and mobility (R26.89)    Time: 1349-1406 PT Time Calculation (min) (ACUTE ONLY): 17 min   Charges:   PT Evaluation $PT Eval Low Complexity: 1 Low   PT General Charges $$ ACUTE PT VISIT: 1 Visit         Aleda Grana, PT, DPT 11/22/23, 2:28 PM   Sandi Mariscal 11/22/2023, 2:24 PM

## 2023-11-22 NOTE — Progress Notes (Signed)
 Echocardiogram 2D Echocardiogram has been performed.  Warren Lacy Tynell Winchell RDCS 11/22/2023, 11:11 AM

## 2023-11-22 NOTE — ED Notes (Signed)
Pt ambulated to bathroom with steady gait and 1-person assist

## 2023-11-22 NOTE — Evaluation (Signed)
 Occupational Therapy Evaluation Patient Details Name: Anita James MRN: [id number] DOB: 04/28/1951 Today's Date: 11/22/2023   History of Present Illness   73 y.o. female adm 3/3 with medical history significant of essential hypertension, hypothyroidism, OSA on CPAP, morbid obesity, diastolic heart failure with preserved EF, peripheral neuropathy, insulin-dependent DM type II, and chronic urinary incontinence presented to emergency department via EMS reporting numbness and tingling of the left arm.  MR Brain:No acute intracranial abnormality.  CT Neck:mild spinal  stenosis at C5-6 and C6-7 with multilevel  foraminal narrowing.     Clinical Impressions The patient admitted for the diagnosis above.  PTA she lives in a ground level apartment with her daughter.  Patient uses a 4WRW for mobility, and as a seat during iADL tasks.  Patient remains Mod I at home, and her daughter is able to assist as needed.  Currently she presents with decreased sensation to her L upper extremity, and decreased activity tolerance due to fluid retention.  She is probably very close to her baseline, and should progress quickly if moved to a regular room. OT will continue efforts in the acute setting to address deficits, and no post acute OT is anticipated.       If plan is discharge home, recommend the following:   Assist for transportation     Functional Status Assessment   Patient has had a recent decline in their functional status and demonstrates the ability to make significant improvements in function in a reasonable and predictable amount of time.     Equipment Recommendations   Tub/shower seat     Recommendations for Other Services         Precautions/Restrictions   Precautions Precautions: Fall Recall of Precautions/Restrictions: Intact Precaution/Restrictions Comments: Bone on bone to B knees Restrictions Weight Bearing Restrictions Per Provider Order: No     Mobility Bed  Mobility Overal bed mobility: Needs Assistance Bed Mobility: Supine to Sit, Sit to Supine     Supine to sit: Min assist Sit to supine: Contact guard assist     Patient Response: Cooperative  Transfers Overall transfer level: Needs assistance Equipment used: 2 person hand held assist Transfers: Sit to/from Stand, Bed to chair/wheelchair/BSC Sit to Stand: Supervision     Step pivot transfers: Min assist            Balance Overall balance assessment: Needs assistance Sitting-balance support: Feet unsupported Sitting balance-Leahy Scale: Good     Standing balance support: Bilateral upper extremity supported Standing balance-Leahy Scale: Poor                             ADL either performed or assessed with clinical judgement   ADL       Grooming: Wash/dry hands;Wash/dry face;Sitting;Set up               Lower Body Dressing: Supervision/safety;Sit to/from Database administrator: Supervision/safety;Rollator (4 wheels)                   Vision Patient Visual Report: No change from baseline       Perception Perception: Within Functional Limits       Praxis Praxis: WFL       Pertinent Vitals/Pain Pain Assessment Pain Assessment: No/denies pain     Extremity/Trunk Assessment Upper Extremity Assessment Upper Extremity Assessment: Right hand dominant;LUE deficits/detail LUE Sensation: decreased light touch   Lower Extremity Assessment Lower Extremity Assessment: Defer to PT evaluation  Cervical / Trunk Assessment Cervical / Trunk Assessment: Normal   Communication Communication Communication: No apparent difficulties   Cognition Arousal: Alert Behavior During Therapy: WFL for tasks assessed/performed Cognition: No apparent impairments                               Following commands: Intact       Cueing  General Comments       VSS on supplemental O2   Exercises     Shoulder Instructions       Home Living Family/patient expects to be discharged to:: Private residence Living Arrangements: Children Available Help at Discharge: Family;Available 24 hours/day Type of Home: Apartment Home Access: Level entry     Home Layout: One level     Bathroom Shower/Tub: Chief Strategy Officer: Standard Bathroom Accessibility: Yes How Accessible: Accessible via walker Home Equipment: Rollator (4 wheels);Grab bars - tub/shower          Prior Functioning/Environment Prior Level of Function : Independent/Modified Independent                    OT Problem List: Decreased activity tolerance;Impaired balance (sitting and/or standing)   OT Treatment/Interventions: Self-care/ADL training;Therapeutic activities;Balance training;DME and/or AE instruction      OT Goals(Current goals can be found in the care plan section)   Acute Rehab OT Goals Patient Stated Goal: Return home and get the feeling back to my L arm OT Goal Formulation: With patient Time For Goal Achievement: 12/06/23 Potential to Achieve Goals: Good ADL Goals Pt Will Perform Grooming: with modified independence;sitting;standing Pt Will Perform Lower Body Dressing: with modified independence;sit to/from stand Pt Will Transfer to Toilet: with modified independence;regular height toilet;ambulating   OT Frequency:  Min 1X/week    Co-evaluation              AM-PAC OT "6 Clicks" Daily Activity     Outcome Measure Help from another person eating meals?: None Help from another person taking care of personal grooming?: None Help from another person toileting, which includes using toliet, bedpan, or urinal?: A Little Help from another person bathing (including washing, rinsing, drying)?: A Little Help from another person to put on and taking off regular upper body clothing?: None Help from another person to put on and taking off regular lower body clothing?: A Little 6 Click Score: 21   End of  Session Nurse Communication: Mobility status  Activity Tolerance: Patient tolerated treatment well Patient left: in bed;with call bell/phone within reach  OT Visit Diagnosis: Unsteadiness on feet (R26.81)                Time: 4098-1191 OT Time Calculation (min): 22 min Charges:  OT General Charges $OT Visit: 1 Visit OT Evaluation $OT Eval Moderate Complexity: 1 Mod  11/22/2023  RP, OTR/L  Acute Rehabilitation Services  Office:  [phone]   Suzanna Obey 11/22/2023, 8:44 AM

## 2023-11-22 NOTE — Progress Notes (Addendum)
 STROKE TEAM PROGRESS NOTE   INTERIM HISTORY/SUBJECTIVE Reporting continued diminished sensation on the left forearm  Requesting to follow up with [facility] On chart review she was seen at [facility] in 2015 for similar symptoms and was diagnosed with bilateral carpal tunnel  OBJECTIVE  CBC    Component Value Date/Time   WBC 6.6 11/21/2023 2214   RBC 4.56 11/21/2023 2214   HGB 12.9 11/21/2023 2214   HGB 13.7 11/21/2023 2214   HGB 12.0 05/09/2019 1440   HCT 38.0 11/21/2023 2214   HCT 41.9 11/21/2023 2214   HCT 34.9 05/09/2019 1440   PLT 191 11/21/2023 2214   PLT 226 05/09/2019 1440   MCV 91.9 11/21/2023 2214   MCV 88 05/09/2019 1440   MCV 84 06/21/2013 0438   MCH 30.0 11/21/2023 2214   MCHC 32.7 11/21/2023 2214   RDW 13.9 11/21/2023 2214   RDW 12.6 05/09/2019 1440   RDW 13.2 06/21/2013 0438   LYMPHSABS 1.7 11/21/2023 2214   LYMPHSABS 1.5 05/09/2019 1440   MONOABS 0.8 11/21/2023 2214   EOSABS 0.1 11/21/2023 2214   EOSABS 0.1 05/09/2019 1440   BASOSABS 0.0 11/21/2023 2214   BASOSABS 0.0 05/09/2019 1440    BMET    Component Value Date/Time   NA 143 11/21/2023 2214   NA 142 11/21/2023 2214   NA 145 (H) 05/09/2019 1440   NA 138 06/21/2013 0438   K 3.8 11/21/2023 2214   K 3.8 11/21/2023 2214   K 3.8 06/21/2013 0438   CL 108 11/21/2023 2214   CL 104 11/21/2023 2214   CL 105 06/21/2013 0438   CO2 27 11/21/2023 2214   CO2 26 06/21/2013 0438   GLUCOSE 168 (H) 11/21/2023 2214   GLUCOSE 179 (H) 11/21/2023 2214   GLUCOSE 214 (H) 06/21/2013 0438   BUN 15 11/21/2023 2214   BUN 12 11/21/2023 2214   BUN 17 05/09/2019 1440   BUN 7 06/21/2013 0438   CREATININE 1.00 11/21/2023 2214   CREATININE 0.97 11/21/2023 2214   CREATININE 0.97 11/25/2016 1115   CALCIUM 9.2 11/21/2023 2214   CALCIUM 8.9 06/21/2013 0438   GFRNONAA >60 11/21/2023 2214   GFRNONAA 62 11/25/2016 1115    IMAGING past 24 hours CT ANGIO HEAD NECK W WO CM Result Date: 11/22/2023 CLINICAL DATA:  Stroke workup EXAM: CT  ANGIOGRAPHY HEAD AND NECK WITH AND WITHOUT CONTRAST TECHNIQUE: Multidetector CT imaging of the head and neck was performed using the standard protocol during bolus administration of intravenous contrast. Multiplanar CT image reconstructions and MIPs were obtained to evaluate the vascular anatomy. Carotid stenosis measurements (when applicable) are obtained utilizing NASCET criteria, using the distal internal carotid diameter as the denominator. RADIATION DOSE REDUCTION: This exam was performed according to the departmental dose-optimization program which includes automated exposure control, adjustment of the mA and/or kV according to patient size and/or use of iterative reconstruction technique. CONTRAST:  75mL OMNIPAQUE IOHEXOL 350 MG/ML SOLN COMPARISON:  Brain MRI from earlier today FINDINGS: CT HEAD FINDINGS Brain: No evidence of acute infarction, hemorrhage, hydrocephalus, extra-axial collection or mass lesion/mass effect. Vascular: Atheromatous plaque.  See below. Skull: Normal. Negative for fracture or focal lesion. Sinuses/Orbits: No acute finding. Review of the MIP images confirms the above findings CTA NECK FINDINGS Aortic arch: Mild atheromatous plaque.  Three vessel branching. Right carotid system: Partial retropharyngeal course. Atheromatous calcification at the carotid bifurcation. Left carotid system: Partial retropharyngeal course. Atheromatous calcification at the carotid bifurcation. No stenosis or ulceration. Vertebral arteries: No proximal subclavian stenosis. The  left vertebral artery is dominant. Right vertebral origin assessment is limited by the degree of motion artifact. No stenosis or beading. Skeleton: No acute or aggressive finding.  Right sphenoid sinusitis. Other neck: Negative Upper chest: Clear apical lungs Review of the MIP images confirms the above findings CTA HEAD FINDINGS Anterior circulation: No significant stenosis, proximal occlusion, aneurysm, or vascular malformation.  Atheromatous plaque along the carotid siphons which is mild. Posterior circulation: Dominant left vertebral artery with atheromatous plaque. Fetal type PCA especially on the right. There is atheromatous irregularity of the posterior cerebral arteries with tandem moderate stenoses at the proximal left P2 segment. Venous sinuses: Diffusely patent Anatomic variants: None significant Review of the MIP images confirms the above findings IMPRESSION: 1. No emergent finding. 2. Atherosclerosis without flow reducing stenosis of major arteries in the neck. There is limitation in visualizing the proximal right vertebral artery due to artifact. 3. Intracranial atherosclerosis most notably affecting the left P2 segment where there is tandem moderate stenosis. Electronically Signed   By: Tiburcio Pea M.D.   On: 11/22/2023 04:32   MR Cervical Spine Wo Contrast Result Date: 11/22/2023 CLINICAL DATA:  Initial evaluation for cervical radiculopathy, left arm numbness. EXAM: MRI CERVICAL SPINE WITHOUT CONTRAST TECHNIQUE: Multiplanar, multisequence MR imaging of the cervical spine was performed. No intravenous contrast was administered. COMPARISON:  Prior CT from 04/09/2020 FINDINGS: Alignment: Examination moderately degraded by motion artifact. Straightening with mild reversal of the normal cervical lordosis. Trace degenerative anterolisthesis of C3 on C4, C4 on C5, and C7 on T1, with trace retrolisthesis of C6 on C7. Vertebrae: Mild chronic height loss noted at the superior endplates of T2 and T3. Vertebral body height otherwise maintained with no acute or recent fracture. Bone marrow signal intensity mildly heterogeneous with decreased T1 signal intensity, nonspecific, but most commonly related to anemia, smoking, or obesity. No discrete or worrisome osseous lesions. Mild degenerative reactive endplate changes present about the C6-7 interspace. No abnormal marrow edema. Cord: Normal signal and morphology. Posterior Fossa,  vertebral arteries, paraspinal tissues: Unremarkable. Disc levels: C2-C3: Normal interspace. Mild bilateral facet degeneration. No spinal stenosis. Foramina remain patent. C3-C4: Mild disc bulge with left greater than right uncovertebral spurring. Mild to moderate left with mild right facet hypertrophy. No significant spinal stenosis. Mild left C4 foraminal narrowing. Right neural foramen remains grossly patent. C4-C5: Minimal disc bulge with uncovertebral spurring. Mild left-sided facet hypertrophy. No spinal stenosis. Mild left C5 foraminal narrowing. Right neural foramen remains patent. C5-C6: Advanced degenerative intervertebral disc space narrowing with diffuse disc osteophyte complex. Broad posterior component flattens and partially faces the ventral thecal sac. Mild spinal stenosis. Mild to moderate bilateral C6 foraminal narrowing. C6-C7: Degenerative intervertebral disc space narrowing with diffuse disc osteophyte complex. Broad posterior component flattens and partially effaces the ventral thecal sac, slightly asymmetric to the left. Mild spinal stenosis. Moderate left with mild right C7 foraminal narrowing. C7-T1: Trace anterolisthesis without significant disc bulge. Moderate right with mild left facet hypertrophy. No spinal stenosis. Foramina remain patent. T1-2: Negative interspace.  Mild facet hypertrophy.  No stenosis. T2-3: Seen only on sagittal projection. Minimal disc bulge with endplate spurring. No spinal stenosis. T3-4: Seen only on sagittal projection. Mild disc bulge with endplate spurring. No significant stenosis. IMPRESSION: 1. Motion degraded exam. 2. Multilevel cervical spondylosis with resultant mild spinal stenosis at C5-6 and C6-7. 3. Multifactorial degenerative changes with resultant multilevel foraminal narrowing as above. Notable findings include mild left C4 and C5 foraminal stenosis, mild to moderate bilateral C6 foraminal  narrowing, with moderate left and mild right C7 foraminal  stenosis. Electronically Signed   By: Rise Mu M.D.   On: 11/22/2023 03:27   MR BRAIN WO CONTRAST Result Date: 11/22/2023 CLINICAL DATA:  Initial evaluation for acute neuro deficit, stroke suspected. EXAM: MRI HEAD WITHOUT CONTRAST TECHNIQUE: Multiplanar, multiecho pulse sequences of the brain and surrounding structures were obtained without intravenous contrast. COMPARISON:  CT from 11/21/2023 FINDINGS: Brain: Examination degraded by motion artifact. Mild age-related cerebral atrophy. Patchy T2/FLAIR hyperintensity involving the periventricular and deep white matter both cerebral hemispheres, most likely related chronic microvascular ischemic disease, mild for age. No evidence for acute or subacute infarct. Gray-white matter differentiation maintained. No areas of chronic cortical infarction. No acute or chronic intracranial blood products. No mass lesion, midline shift or mass effect. No hydrocephalus or extra-axial fluid collection. Previously questioned small left-sided subdural hygroma is not seen, with appearance due to slight asymmetric prominence of the subarachnoid space overlying the left cerebral convexity as compared to the right. Pituitary gland within normal limits. Vascular: Major intracranial vascular flow voids are maintained. Skull and upper cervical spine: Craniocervical junction within normal limits. Bone marrow signal intensity grossly normal. No scalp soft tissue abnormality. Sinuses/Orbits: Prior bilateral ocular lens replacement. Paranasal sinuses are largely clear. Trace bilateral mastoid effusions noted, of doubtful significance. Negative nasopharynx. Other: None. IMPRESSION: 1. No acute intracranial abnormality. 2. Mild age-related cerebral atrophy with chronic small vessel ischemic disease. 3. Previously questioned small left-sided subdural hygroma is not seen. This was due to slight asymmetric prominence of the subarachnoid space overlying the left cerebral convexity.  Electronically Signed   By: Rise Mu M.D.   On: 11/22/2023 03:16   CT HEAD CODE STROKE WO CONTRAST Result Date: 11/21/2023 CLINICAL DATA:  Code stroke. Initial evaluation for acute neuro deficit, left-sided numbness. EXAM: CT HEAD WITHOUT CONTRAST TECHNIQUE: Contiguous axial images were obtained from the base of the skull through the vertex without intravenous contrast. RADIATION DOSE REDUCTION: This exam was performed according to the departmental dose-optimization program which includes automated exposure control, adjustment of the mA and/or kV according to patient size and/or use of iterative reconstruction technique. COMPARISON:  Prior study from 04/09/2020 FINDINGS: Brain: Cerebral volume within normal limits. No acute intracranial hemorrhage. No acute large vessel territory infarct. No mass lesion or midline shift. No hydrocephalus. 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 Vascular: No abnormal hyperdense vessel. Calcified atherosclerosis present at the skull base. Skull: Scalp soft tissues within normal limits.  Calvarium intact. Sinuses/Orbits: Globes orbital soft tissues within normal limits. Chronic right sphenoid sinus disease noted. Paranasal sinuses are otherwise clear. No significant mastoid effusion. Other: None. ASPECTS [facility] Stroke Program Early CT Score) - Ganglionic level infarction (caudate, lentiform nuclei, internal capsule, insula, M1-M3 cortex): 7 - Supraganglionic infarction (M4-M6 cortex): 3 Total score (0-10 with 10 being normal): 10 IMPRESSION: 1. No acute intracranial abnormality. 2. ASPECTS is 10. 3. 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 No significant mass effect or midline shift. These results were communicated to Dr. Derry Lory at 10:26 pm on 11/21/2023 by text page via the [facility] messaging system. Electronically Signed   By: Rise Mu M.D.   On: 11/21/2023 22:28    Vitals:   11/22/23 0304 11/22/23 0430 11/22/23 0726 11/22/23 0820  BP: (!) 186/103 (!) 172/94  (!) 173/87  Pulse: 81 83  80  Resp: 19 19  14  Temp: (!) 97.3 F (36.3 C)  97.8 F (36.6 C)   TempSrc:   Oral   SpO2: 100% 100%  98%  Weight:      Height:         PHYSICAL EXAM General:  Alert, well-nourished, well-developed patient in no acute distress Psych:  Mood and affect appropriate for situation CV: Regular rate and rhythm on monitor Respiratory:  Regular, unlabored respirations on room air GI: Abdomen soft and nontender   NEURO:  Mental Status: AA&Ox3, patient is able to give clear and coherent history Speech/Language: speech is without dysarthria or aphasia.  Naming, repetition, fluency, and comprehension intact.  Cranial Nerves:  II: PERRL. Visual fields full.  III, IV, VI: EOMI. Eyelids elevate symmetrically.  V: Sensation is intact to light touch and symmetrical to face.  VII: Face is symmetrical resting and smiling VIII: hearing intact to voice. IX, X: Palate elevates symmetrically. Phonation is normal.  NG:[id number] shrug 5/5. XII: tongue is midline without fasciculations. Motor: 5/5 strength to all muscle groups tested.  Tone: is normal and bulk is normal Sensation- Diminished sensation noted in 3rd finger on the left hand and wrist and forearm. Notes full sensation in the palm. Coordination: FTN intact bilaterally, HKS: no ataxia in BLE.No drift.  Gait- deferred  ASSESSMENT/PLAN  Anita James is a 73 y.o. female with history of CHF, DM2, HLD, morbid obesity, HTN, hypothyroidism who was watching TV and had sudden onset numbness in her left had and leg. NIH on Admission 4  Most likely left UE radiculopathy Left forearm circumference sensory loss, with elbow pain, left middle finger sensory loss. Denies neck pain but complaining back pain Code Stroke CT head No acute abnormality. Small vessel disease. ASPECTS 10.     CTA head & neck left P2 segment tandem moderate stenosis. MRI C-spine- Multilevel cervical spondylosis with resultant mild spinal stenosis at C5-6 and C6-7.  Multifactorial degenerative changes with resultant multilevel foraminal narrowing as above. Notable findings include mild left C4 and C5 foraminal stenosis, mild to moderate bilateral C6 foraminal narrowing, with moderate left and mild right C7 foraminal stenosis. MRI  No acute intracranial abnormality  2D Echo EF 60-65%  LDL 103 HgbA1c 6.7 UDS negative VTE prophylaxis - Lovenox aspirin 81 mg daily prior to admission, continue ASA on discharge. Continue Lyrica Therapy recommendations: Home health PT Disposition:  pending   CHF HTN Home meds:  Jardiance, Coreg, torsemide On Coreg Long-term BP goal normotensive  Hyperlipidemia Home meds:  pitavastatin calcium LDL 103, goal < 70 Continue statin at discharge  Diabetes type II Controlled Home meds:  Insulin  HgbA1c 6.7, goal < 7.0 Now on insulin CBGs SSI Recommend close follow-up with PCP for better DM control  Other Stroke Risk Factors Obesity, Body mass index is 54.73 kg/m., BMI >/= 30 associated with increased stroke risk, recommend weight loss, diet and exercise as appropriate   Other Active Problems Neuropathy, continue lyrica Hx of carpal tunnel 05/2014 Nerve conduction studies showed evidence of bilateral carpal tunnel syndrome and mild distal bilateral ulnar neuropathies  Hospital day # 0  Patient seen and examined by NP/APP with MD. MD to update note as needed.   Elmer Picker, DNP, FNP-BC [facility] Pager: [phone]  ATTENDING NOTE: I reviewed above note and agree with the assessment and plan. Pt was seen and examined.   Patient family and caregiver at bedside.  Patient lying bed, still complaining of left arm below elbow decree sensation but preserved finger sensation except  middle finger.  Left elbow pain but denies neck pain.  Some  discomfort on the left shoulder.  No weakness, no leg numbness.  MRI negative for stroke.  MRI C-spine showed DJD.  Patient condition not consistent with stroke but more radiculopathy from C6-T1.  Continue PT and OT.  Continue home aspirin and statin.  Neurology will sign off. Please call with questions. Pt will follow up neurology at [facility] in about 4 weeks. Thanks for the consult.  For detailed assessment and plan, please refer to above/below as I have made changes wherever appropriate.   Marvel Plan, MD PhD Stroke Neurology 11/22/2023 6:06 PM          To contact Stroke Continuity provider, please refer to [email or web address]. After hours, contact General Neurology

## 2023-11-22 NOTE — H&P (Addendum)
 History and Physical    Anita James ZOX:[id number] DOB: Jul 11, 1951 DOA: 11/21/2023  PCP: [facility]   Patient coming from: Home   Chief Complaint:  Chief Complaint  Patient presents with   Code Stroke   ED TRIAGE note:  Pt BIB EMS from home as code stroke. LKN 2015 this evening, only symptom is sensory deficits to L arm. Alert and oriented x4. BG 157, 18g LFA.       HPI:  Anita James is a 73 y.o. female with medical history significant of essential hypertension, hypothyroidism, OSA on CPAP, morbid obesity, diastolic heart failure with preserved EF, peripheral neuropathy, insulin-dependent DM type II, and chronic urinary incontinence presented to emergency department via EMS as a code stroke.  Last known well 8:15 PM 11/21/2023.  Patient reported she is having sensation of numbness and tingling of the left arm.  Patient denies any weakness.  Denies any headache, blurry vision, aphasia, dysphagia, confusion, focal weakness, generalized weakness, tremor, presyncope and syncope.  Denies any chest pain, chest pressure and palpitation.  Complaining about bilateral lower extremities swelling. E Course:  At presentation to ED patient found to have elevated blood pressure 179/86 and blood pressure trend up to 187/99. Found to have elevated lactic acid 2. CMP unremarkable. CBC unremarkable. Normal APTT and INR. Blood alcohol of less than 10. Blood glucose within normal range.  EKG showing sinus rhythm heart rate 81.  CT head no acute intracranial abnormality.  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  No mass effect or midline shift.  Neurology Dr. Derry Lory Has been consulted in the ED recommended for admission for stroke workup.  Waiting for formal neurology recommendation for antiplatelet therapy.  Per neurology patient has very mild symptom as well as out of window for any TNK therapy.  Specialist has been consulted  for further evaluation and management of left-sided arm tingling numbness leading to stroke workup.   Significant labs in the ED: Lab Orders         Ethanol         Protime-INR         APTT         CBC         Differential         Comprehensive metabolic panel         Urine rapid drug screen (hosp performed)         Urinalysis, Routine w reflex microscopic -Urine, Clean Catch         HIV Antibody (routine testing w rflx)         Lipid panel         Hemoglobin A1c         I-stat chem 8, ED         CBG monitoring, ED         I-Stat CG4 Lactic Acid, ED       Review of Systems:  Review of Systems  Constitutional:  Negative for chills, fever, malaise/fatigue and weight loss.  HENT:  Negative for hearing loss.   Eyes:  Negative for blurred vision, double vision and photophobia.  Respiratory:  Negative for cough, sputum production and shortness of breath.   Cardiovascular:  Negative for chest pain and palpitations.  Gastrointestinal:  Negative for heartburn, nausea and vomiting.  Musculoskeletal:  Negative for back pain, falls, joint pain, myalgias and neck pain.  Neurological:  Positive for tingling and sensory change. Negative  for dizziness, tremors, speech change, focal weakness, seizures, weakness and headaches.  Psychiatric/Behavioral:  The patient is not nervous/anxious.     Past Medical History:  Diagnosis Date   Abdominal pain    Anxiety    Carpal tunnel syndrome 06/14/2014   Chest pain    CHF (congestive heart failure) (HCC)    Chronic back pain    Chronic kidney disease 03/2013   failure due to meds   Diabetes mellitus type II, uncontrolled    Diverticulitis    Frequent headaches    Hiatal hernia    Hyperlipemia    Hypertension    Hypothyroidism    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   Neuropathy    Obesity    Pneumonia    Vitamin D deficiency 06/05/2014    Past Surgical History:  Procedure Laterality Date   CESAREAN SECTION     COLONOSCOPY WITH PROPOFOL  N/A 07/01/2021   Procedure: COLONOSCOPY WITH PROPOFOL;  Surgeon: Kerin Salen, MD;  Location: [facility];  Service: Gastroenterology;  Laterality: N/A;   KNEE SURGERY Left    THYROID SURGERY     goiter     reports that she has never smoked. She has never used smokeless tobacco. She reports that she does not drink alcohol and does not use drugs.  Allergies  Allergen Reactions   Amoxicillin-Pot Clavulanate Nausea And Vomiting and Other (See Comments)    Combination of medications tear lining of stomach  Other Reaction(s): GI Intolerance, Not available   Oxycodone Shortness Of Breath    oxycontin too   Sulfa Antibiotics Shortness Of Breath   Amoxicillin     Other Reaction(s): Unknown   Norvasc [Amlodipine Besylate] Swelling    feet   Penicillins    Sulfamethoxazole Nausea Only and Other (See Comments)    Other Reaction(s): GI Intolerance   Tramadol Other (See Comments)    Unknown, toxicity. Patient advised she had to be given Narcan with this    Family History  Problem Relation Age of Onset   Cancer Mother        cervical- mets   Breast cancer Mother    Cancer Father    Heart disease Father    Diabetes Father    Diabetes Sister    Breast cancer Sister    Heart disease Sister    Breast cancer Sister    Heart disease Sister    Breast cancer Sister    Breast cancer Sister    COPD Sister    Breast cancer Sister    Colon cancer Neg Hx    Esophageal cancer Neg Hx    Rectal cancer Neg Hx    Stomach cancer Neg Hx     Prior to Admission medications   Medication Sig Start Date End Date Taking? Authorizing Provider  albuterol (ACCUNEB) 0.63 MG/3ML nebulizer solution Take 1 ampule by nebulization 3 (three) times daily as needed for wheezing or shortness of breath. 01/07/21  Yes [provider]  allopurinol (ZYLOPRIM) 100 MG tablet Take 100 mg by mouth daily.   Yes [provider]  aspirin EC 81 MG tablet Take 81 mg by mouth daily. 11/20/16  Yes [provider]  Budeson-Glycopyrrol-Formoterol (BREZTRI AEROSPHERE) 160-9-4.8 MCG/ACT AERO Inhale 2 puffs into the lungs in the morning and at bedtime.   Yes [provider]  carvedilol (COREG) 25 MG tablet Take 25 mg by mouth 2 (two) times daily.   Yes [provider]  cetirizine (ZYRTEC) 10 MG tablet Take 10  mg by mouth daily as needed for allergies.   Yes [provider]  diclofenac sodium (VOLTAREN) 1 % GEL Apply 4 g topically 4 (four) times daily. Patient taking differently: Apply 4 g topically daily as needed (pain). 02/27/19  Yes Melene Plan, DO  empagliflozin (JARDIANCE) 10 MG TABS tablet Take 10 mg by mouth daily.   Yes [provider]  feeding supplement, GLUCERNA SHAKE, (GLUCERNA SHAKE) LIQD Take 237 mLs by mouth every morning.   Yes [provider]  fluticasone (FLONASE) 50 MCG/ACT nasal spray PLACE 2 SPRAYS IN EACH NOSTRIL DAILY AS NEEDED FOR ALLERGIES 07/21/19  Yes Marcine Matar, MD  insulin aspart (NOVOLOG FLEXPEN) 100 UNIT/ML FlexPen Inject 60 Units into the skin 3 (three) times daily with meals. Patient taking differently: Inject 20-45 Units into the skin 3 (three) times daily after meals. 12/05/19  Yes Marcine Matar, MD  Insulin Glargine (LANTUS SOLOSTAR) 100 UNIT/ML Solostar Pen Inject 84 Units into the skin daily. Patient taking differently: Inject 100 Units into the skin daily. 10/13/19  Yes Marcine Matar, MD  levothyroxine (SYNTHROID) 100 MCG tablet Take 1 tablet (100 mcg total) by mouth daily. 10/13/19  Yes Marcine Matar, MD  lidocaine (LIDODERM) 5 % Place 1 patch onto the skin every 12 (twelve) hours. Remove & Discard patch within 12 hours or as directed by MD 10/19/23  Yes Burna Forts, MD  NYSTATIN powder APPLY TOPICALLY 4 TIMES DAILY. Patient taking differently: Apply 1 Bottle topically 4 (four) times daily as needed (rash). 01/18/17  Yes Langeland, Dawn T, MD  omeprazole (PRILOSEC) 20 MG capsule Take 20 mg by mouth  daily.   Yes [provider]  Pitavastatin Calcium (LIVALO) 4 MG TABS Take 1 tablet by mouth at bedtime.   Yes [provider]  pregabalin (LYRICA) 75 MG capsule Take 75 mg by mouth 2 (two) times daily.   Yes [provider]  torsemide (DEMADEX) 20 MG tablet Take 20 mg by mouth daily as needed (fluid). 05/09/18  Yes [provider]  Zinc Acetate 50 MG CAPS Take 1 capsule by mouth in the morning and at bedtime.   Yes [provider]  Accu-Chek FastClix Lancets MISC USE AS DIRECTED TO CHECK BLOOD SUGAR THREE TIMES DAILY 11/07/19   Marcine Matar, MD  ACCU-CHEK GUIDE test strip USE AS DIRECTED TO CHECK BLOOD SUGAR THREE TIMES DAILY 10/04/19   Marcine Matar, MD  Blood Glucose Monitoring Suppl (ACCU-CHEK AVIVA PLUS) w/Device KIT USE AS DIRECTED TO TEST TWICE DAILY 04/27/18   Marcine Matar, MD  Insulin Pen Needle (TRUEPLUS PEN NEEDLES) 32G X 4 MM MISC Use to inject insulin. 10/13/18   Marcine Matar, MD  NOVOFINE 32G X 6 MM MISC SMARTSIG:1 SUB-Q 4-5 Times Daily 01/22/20   [provider]     Physical Exam: Vitals:   11/21/23 2334 11/22/23 0100 11/22/23 0304 11/22/23 0430  BP:  (!) 187/99 (!) 186/103 (!) 172/94  Pulse:  80 81 83  Resp:  (!) 21 19 19   Temp:   (!) 97.3 F (36.3 C)   TempSrc:      SpO2:  97% 100% 100%  Weight: (!) 153.8 kg     Height: 5\' 6"  (1.676 m)       Physical Exam Constitutional:      General: She is not in acute distress.    Appearance: She is not ill-appearing.  HENT:     Mouth/Throat:     Mouth:  Mucous membranes are moist.  Eyes:     Conjunctiva/sclera: Conjunctivae normal.  Cardiovascular:     Rate and Rhythm: Normal rate and regular rhythm.     Pulses: Normal pulses.     Heart sounds: Normal heart sounds.  Pulmonary:     Effort: Pulmonary effort is normal.     Breath sounds: Normal breath sounds.  Abdominal:     General: Bowel sounds are normal.  Musculoskeletal:        General: Swelling  present.     Cervical back: Neck supple.     Right lower leg: Edema present.     Left lower leg: Edema present.  Skin:    Capillary Refill: Capillary refill takes less than 2 seconds.  Neurological:     Mental Status: She is alert and oriented to person, place, and time.     Cranial Nerves: No cranial nerve deficit.     Sensory: Sensory deficit present.     Motor: No weakness.     Gait: Gait abnormal.     Deep Tendon Reflexes: Reflexes normal.  Psychiatric:        Mood and Affect: Mood normal.      Labs on Admission: I have personally reviewed following labs and imaging studies  CBC: Recent Labs  Lab 11/21/23 2214  WBC 6.6  NEUTROABS 4.0  HGB 13.7  12.9  HCT 41.9  38.0  MCV 91.9  PLT 191   Basic Metabolic Panel: Recent Labs  Lab 11/21/23 2214  NA 142  143  K 3.8  3.8  CL 104  108  CO2 27  GLUCOSE 179*  168*  BUN 12  15  CREATININE 0.97  1.00  CALCIUM 9.2   GFR: Estimated Creatinine Clearance: 80.2 mL/min (by C-G formula based on SCr of 1 mg/dL). Liver Function Tests: Recent Labs  Lab 11/21/23 2214  AST 19  ALT 14  ALKPHOS 68  BILITOT 0.6  PROT 6.5  ALBUMIN 3.3*   No results for input(s): "LIPASE", "AMYLASE" in the last 168 hours. No results for input(s): "AMMONIA" in the last 168 hours. Coagulation Profile: Recent Labs  Lab 11/21/23 2214  INR 1.0   Cardiac Enzymes: No results for input(s): "CKTOTAL", "CKMB", "CKMBINDEX", "TROPONINI", "TROPONINIHS" in the last 168 hours. BNP (last 3 results) Recent Labs    04/30/23 2158  BNP 25.6   HbA1C: No results for input(s): "HGBA1C" in the last 72 hours. CBG: Recent Labs  Lab 11/21/23 2209  GLUCAP 168*   Lipid Profile: No results for input(s): "CHOL", "HDL", "LDLCALC", "TRIG", "CHOLHDL", "LDLDIRECT" in the last 72 hours. Thyroid Function Tests: No results for input(s): "TSH", "T4TOTAL", "FREET4", "T3FREE", "THYROIDAB" in the last 72 hours. Anemia Panel: No results for input(s):  "VITAMINB12", "FOLATE", "FERRITIN", "TIBC", "IRON", "RETICCTPCT" in the last 72 hours. Urine analysis:    Component Value Date/Time   COLORURINE STRAW (A) 07/21/2022 2329   APPEARANCEUR CLEAR 07/21/2022 2329   APPEARANCEUR Clear 05/09/2019 1512   LABSPEC 1.010 07/21/2022 2329   LABSPEC 1.005 06/21/2013 0438   PHURINE 6.0 07/21/2022 2329   GLUCOSEU >=500 (A) 07/21/2022 2329   GLUCOSEU Negative 06/21/2013 0438   HGBUR NEGATIVE 07/21/2022 2329   BILIRUBINUR NEGATIVE 07/21/2022 2329   BILIRUBINUR Negative 05/09/2019 1512   BILIRUBINUR Negative 06/21/2013 0438   KETONESUR NEGATIVE 07/21/2022 2329   PROTEINUR NEGATIVE 07/21/2022 2329   UROBILINOGEN 0.2 08/03/2018 1707   UROBILINOGEN 0.2 12/23/2013 1408   NITRITE NEGATIVE 07/21/2022 2329   LEUKOCYTESUR NEGATIVE 07/21/2022 2329  LEUKOCYTESUR Trace 06/21/2013 0438    Radiological Exams on Admission: I have personally reviewed images CT ANGIO HEAD NECK W WO CM Result Date: 11/22/2023 CLINICAL DATA:  Stroke workup EXAM: CT ANGIOGRAPHY HEAD AND NECK WITH AND WITHOUT CONTRAST TECHNIQUE: Multidetector CT imaging of the head and neck was performed using the standard protocol during bolus administration of intravenous contrast. Multiplanar CT image reconstructions and MIPs were obtained to evaluate the vascular anatomy. Carotid stenosis measurements (when applicable) are obtained utilizing NASCET criteria, using the distal internal carotid diameter as the denominator. RADIATION DOSE REDUCTION: This exam was performed according to the departmental dose-optimization program which includes automated exposure control, adjustment of the mA and/or kV according to patient size and/or use of iterative reconstruction technique. CONTRAST:  75mL OMNIPAQUE IOHEXOL 350 MG/ML SOLN COMPARISON:  Brain MRI from earlier today FINDINGS: CT HEAD FINDINGS Brain: No evidence of acute infarction, hemorrhage, hydrocephalus, extra-axial collection or mass lesion/mass effect.  Vascular: Atheromatous plaque.  See below. Skull: Normal. Negative for fracture or focal lesion. Sinuses/Orbits: No acute finding. Review of the MIP images confirms the above findings CTA NECK FINDINGS Aortic arch: Mild atheromatous plaque.  Three vessel branching. Right carotid system: Partial retropharyngeal course. Atheromatous calcification at the carotid bifurcation. Left carotid system: Partial retropharyngeal course. Atheromatous calcification at the carotid bifurcation. No stenosis or ulceration. Vertebral arteries: No proximal subclavian stenosis. The left vertebral artery is dominant. Right vertebral origin assessment is limited by the degree of motion artifact. No stenosis or beading. Skeleton: No acute or aggressive finding.  Right sphenoid sinusitis. Other neck: Negative Upper chest: Clear apical lungs Review of the MIP images confirms the above findings CTA HEAD FINDINGS Anterior circulation: No significant stenosis, proximal occlusion, aneurysm, or vascular malformation. Atheromatous plaque along the carotid siphons which is mild. Posterior circulation: Dominant left vertebral artery with atheromatous plaque. Fetal type PCA especially on the right. There is atheromatous irregularity of the posterior cerebral arteries with tandem moderate stenoses at the proximal left P2 segment. Venous sinuses: Diffusely patent Anatomic variants: None significant Review of the MIP images confirms the above findings IMPRESSION: 1. No emergent finding. 2. Atherosclerosis without flow reducing stenosis of major arteries in the neck. There is limitation in visualizing the proximal right vertebral artery due to artifact. 3. Intracranial atherosclerosis most notably affecting the left P2 segment where there is tandem moderate stenosis. Electronically Signed   By: Tiburcio Pea M.D.   On: 11/22/2023 04:32   MR Cervical Spine Wo Contrast Result Date: 11/22/2023 CLINICAL DATA:  Initial evaluation for cervical  radiculopathy, left arm numbness. EXAM: MRI CERVICAL SPINE WITHOUT CONTRAST TECHNIQUE: Multiplanar, multisequence MR imaging of the cervical spine was performed. No intravenous contrast was administered. COMPARISON:  Prior CT from 04/09/2020 FINDINGS: Alignment: Examination moderately degraded by motion artifact. Straightening with mild reversal of the normal cervical lordosis. Trace degenerative anterolisthesis of C3 on C4, C4 on C5, and C7 on T1, with trace retrolisthesis of C6 on C7. Vertebrae: Mild chronic height loss noted at the superior endplates of T2 and T3. Vertebral body height otherwise maintained with no acute or recent fracture. Bone marrow signal intensity mildly heterogeneous with decreased T1 signal intensity, nonspecific, but most commonly related to anemia, smoking, or obesity. No discrete or worrisome osseous lesions. Mild degenerative reactive endplate changes present about the C6-7 interspace. No abnormal marrow edema. Cord: Normal signal and morphology. Posterior Fossa, vertebral arteries, paraspinal tissues: Unremarkable. Disc levels: C2-C3: Normal interspace. Mild bilateral facet degeneration. No spinal stenosis. Foramina remain patent.  C3-C4: Mild disc bulge with left greater than right uncovertebral spurring. Mild to moderate left with mild right facet hypertrophy. No significant spinal stenosis. Mild left C4 foraminal narrowing. Right neural foramen remains grossly patent. C4-C5: Minimal disc bulge with uncovertebral spurring. Mild left-sided facet hypertrophy. No spinal stenosis. Mild left C5 foraminal narrowing. Right neural foramen remains patent. C5-C6: Advanced degenerative intervertebral disc space narrowing with diffuse disc osteophyte complex. Broad posterior component flattens and partially faces the ventral thecal sac. Mild spinal stenosis. Mild to moderate bilateral C6 foraminal narrowing. C6-C7: Degenerative intervertebral disc space narrowing with diffuse disc osteophyte  complex. Broad posterior component flattens and partially effaces the ventral thecal sac, slightly asymmetric to the left. Mild spinal stenosis. Moderate left with mild right C7 foraminal narrowing. C7-T1: Trace anterolisthesis without significant disc bulge. Moderate right with mild left facet hypertrophy. No spinal stenosis. Foramina remain patent. T1-2: Negative interspace.  Mild facet hypertrophy.  No stenosis. T2-3: Seen only on sagittal projection. Minimal disc bulge with endplate spurring. No spinal stenosis. T3-4: Seen only on sagittal projection. Mild disc bulge with endplate spurring. No significant stenosis. IMPRESSION: 1. Motion degraded exam. 2. Multilevel cervical spondylosis with resultant mild spinal stenosis at C5-6 and C6-7. 3. Multifactorial degenerative changes with resultant multilevel foraminal narrowing as above. Notable findings include mild left C4 and C5 foraminal stenosis, mild to moderate bilateral C6 foraminal narrowing, with moderate left and mild right C7 foraminal stenosis. Electronically Signed   By: Rise Mu M.D.   On: 11/22/2023 03:27   MR BRAIN WO CONTRAST Result Date: 11/22/2023 CLINICAL DATA:  Initial evaluation for acute neuro deficit, stroke suspected. EXAM: MRI HEAD WITHOUT CONTRAST TECHNIQUE: Multiplanar, multiecho pulse sequences of the brain and surrounding structures were obtained without intravenous contrast. COMPARISON:  CT from 11/21/2023 FINDINGS: Brain: Examination degraded by motion artifact. Mild age-related cerebral atrophy. Patchy T2/FLAIR hyperintensity involving the periventricular and deep white matter both cerebral hemispheres, most likely related chronic microvascular ischemic disease, mild for age. No evidence for acute or subacute infarct. Gray-white matter differentiation maintained. No areas of chronic cortical infarction. No acute or chronic intracranial blood products. No mass lesion, midline shift or mass effect. No hydrocephalus or  extra-axial fluid collection. Previously questioned small left-sided subdural hygroma is not seen, with appearance due to slight asymmetric prominence of the subarachnoid space overlying the left cerebral convexity as compared to the right. Pituitary gland within normal limits. Vascular: Major intracranial vascular flow voids are maintained. Skull and upper cervical spine: Craniocervical junction within normal limits. Bone marrow signal intensity grossly normal. No scalp soft tissue abnormality. Sinuses/Orbits: Prior bilateral ocular lens replacement. Paranasal sinuses are largely clear. Trace bilateral mastoid effusions noted, of doubtful significance. Negative nasopharynx. Other: None. IMPRESSION: 1. No acute intracranial abnormality. 2. Mild age-related cerebral atrophy with chronic small vessel ischemic disease. 3. Previously questioned small left-sided subdural hygroma is not seen. This was due to slight asymmetric prominence of the subarachnoid space overlying the left cerebral convexity. Electronically Signed   By: Rise Mu M.D.   On: 11/22/2023 03:16   CT HEAD CODE STROKE WO CONTRAST Result Date: 11/21/2023 CLINICAL DATA:  Code stroke. Initial evaluation for acute neuro deficit, left-sided numbness. EXAM: CT HEAD WITHOUT CONTRAST TECHNIQUE: Contiguous axial images were obtained from the base of the skull through the vertex without intravenous contrast. RADIATION DOSE REDUCTION: This exam was performed according to the departmental dose-optimization program which includes automated exposure control, adjustment of the mA and/or kV according to patient size and/or use  of iterative reconstruction technique. COMPARISON:  Prior study from 04/09/2020 FINDINGS: Brain: Cerebral volume within normal limits. No acute intracranial hemorrhage. No acute large vessel territory infarct. No mass lesion or midline shift. No hydrocephalus. 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 Vascular: No abnormal hyperdense vessel. Calcified atherosclerosis present at the skull base. Skull: Scalp soft tissues within normal limits.  Calvarium intact. Sinuses/Orbits: Globes orbital soft tissues within normal limits. Chronic right sphenoid sinus disease noted. Paranasal sinuses are otherwise clear. No significant mastoid effusion. Other: None. ASPECTS [facility] Stroke Program Early CT Score) - Ganglionic level infarction (caudate, lentiform nuclei, internal capsule, insula, M1-M3 cortex): 7 - Supraganglionic infarction (M4-M6 cortex): 3 Total score (0-10 with 10 being normal): 10 IMPRESSION: 1. No acute intracranial abnormality. 2. ASPECTS is 10. 3. 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 No significant mass effect or midline shift. These results were communicated to Dr. Derry Lory at 10:26 pm on 11/21/2023 by text page via the [facility] messaging system. Electronically Signed   By: Rise Mu M.D.   On: 11/21/2023 22:28     EKG: My personal interpretation of EKG shows: EKG showing normal sinus rhythm heart rate 81.  Borderline left axis deviation.    Assessment/Plan: Principal Problem:   Numbness and tingling of left upper extremity Active Problems:   Elevated lactic acid level   Essential hypertension   Hypothyroidism   Chronic diastolic CHF (congestive heart failure) (HCC)   OSA on CPAP   Peripheral neuropathy   Insulin dependent type 2 diabetes mellitus (HCC)   Urinary incontinence   Chronic gout   Reactive airway disease    Assessment and Plan: Left-sided upper extremity tingling numbness-concern for small stroke versus TIA -Patient presented emergency department complaining of left-sided arm tingling and numbness.  There is no weakness.  Patient does not have any other focal and generalized weakness, aphasia and dysphagia. -In ED patient has been evaluated by neurology  recommended admission for stroke workup. -CT head no acute intracranial abnormality.  Chronic subdural cystic hygroma without any significant mass and midline shift effect. -Neurology has been consulted concern for small stroke versus TIA and recommended admission for stroke workup. Neurology recommendation continue aspirin and Plavix for 21 days followed by aspirin daily alone. - Pending MRI of the brain. - Pending CTA of the head and neck. -Neurology also MR cervical spine. - Obtain echocardiogram -Continue neurocheck every 4 hours. -Patient passed stroke swallow screen. - Continue permissive hypertension blood pressure below 210/120 for next 24 hours.  Continue hydralazine as needed - Consulted PT and OT. - Continue cardiac monitoring. -Stroke order set has been utilized. Addendum - MRI brain no acute intracranial abnormality.  Chronic small vessel ischemic disease.  No evidence of subdural hygroma.  -CT cervical spine showing cervical spondylosis with mild spinal stenosis C5-C6.  Degenerative changes and multilevel foraminal narrowing. -CT angiogram head and neck no evidence of emergent blood vessel occlusion.  Intracranial atherosclerosis left P2 segment moderate stenosis. -Based on the above workup it is seems like that patient has had episodes of TIA rather than acute CVA.  Continue above management with dual antiplatelet therapy for 3 weeks followed by aspirin alone.   Elevated lactic acid -Elevated lactic acid around 2.  Patient is afebrile no leukocytosis.  Patient is not hypotensive.  There is no concern for infection related lactic acidosis rather than seems like that patient has underlying  low gastric motility in the setting of uncontrolled DM type II. -Continue to monitor WBC count and fever curve.  Essential hypertension Chronic diastolic heart failure with preserved EF 60 to 65% - Previous echo from 05/01/2018 showing grade 1 diastolic heart failure with preserved EF 60 to  65% - Continue permissive hypertension for next 24 hours.  Holding oral blood pressure regimen.  Chronic bilateral lower extremity swelling. - Patient reported taking torsemide as needed for worsening bilateral swelling.  Physical exam showing bilateral lower extremity pitting edema 2+.  Continue torsemide 20 mg daily.  History of OSA on CPAP -Continue CPAP at bedtime  Peripheral neuropathy -Continue Lyrica 75 mg twice daily.  Insulin-dependent DM type II -Patient takes 84 units long-acting insulin in the morning and 60 unit 3 times daily with meals.   -Continue Lantus 84 units daily -Continue moderate SSI and at bedtime insulin as needed coverage.  History of chronic gout -Continue allopurinol  Reactive airway disease -Stable.  Continue Breo Ellipta and Incruse Ellipta once daily.  Hypothyroidism - Continue levothyroxine 100 mcg daily  DVT prophylaxis:  SCDs.  Continue subcu heparin 3 times daily. Code Status:  Full Code Diet: Heart healthy carb modified diet Family Communication:   Family was present at bedside, at the time of interview. Opportunity was given to ask question and all questions were answered satisfactorily.  Disposition Plan: Will follow-up with MRI of the brain, CT of the head and neck and MRI cervical spine. Consults: Neurology Admission status:   Observation, Telemetry bed  Severity of Illness: The appropriate patient status for this patient is OBSERVATION. Observation status is judged to be reasonable and necessary in order to provide the required intensity of service to ensure the patient's safety. The patient's presenting symptoms, physical exam findings, and initial radiographic and laboratory data in the context of their medical condition is felt to place them at decreased risk for further clinical deterioration. Furthermore, it is anticipated that the patient will be medically stable for discharge from the hospital within 2 midnights of admission.      Tereasa Coop, MD [facility]  How to contact the [facility] Attending or Consulting provider 7A - 7P or covering provider during after hours 7P -7A, for this patient.  Check the care team in [facility] and look for a) attending/consulting [facility] provider listed and b) the [facility] team listed Log into [email or web address] and use [place]'s universal password to access. If you do not have the password, please contact the hospital operator. Locate the [facility] provider you are looking for under [facility] and page to a number that you can be directly reached. If you still have difficulty reaching the provider, please page the [facility] (Director on Call) for the Hospitalists listed on amion for assistance.  11/22/2023, 5:46 AM

## 2023-11-22 NOTE — ED Notes (Signed)
 Patient transported to MRI

## 2023-11-22 NOTE — Hospital Course (Signed)
 Same day note  Anita James is a 73 y.o. female with medical history significant of essential hypertension, hypothyroidism, OSA on CPAP, morbid obesity, diastolic heart failure, peripheral neuropathy, insulin-dependent DM type II, and chronic urinary incontinence presented to the hospital as a code stroke.  She presented with numbness and tingling of the left arm and bilateral lower extremity swelling.  In the ED patient was noted to be hypotensive with elevated lactate.  CBC CMP unremarkable.  EKG showed normal sinus rhythm.  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  Neurology was consulted from the ED and was admitted to the hospital for further evaluation and treatment.    Patient seen and examined at bedside.  Patient was admitted to the hospital for numbness and tingling of the left arm.  At the time of my evaluation, patient complains of Physical examination reveals  Laboratory data and imaging was reviewed  Assessment and Plan:  Left-sided upper extremity tingling numbness-likely concerns for TIA. Neurology recommended admission for stroke workup.  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  Neurology recommends aspirin Plavix for 21 days followed by aspirin alone.  MRI of the brain with no acute abnormality and no mention of subdural hygroma.  Mild age-related cerebral atrophy noted.  MRI of the cervical spine with multilevel cervical spondylosis.  CT angiogram of the head and neck showed no large vessel occlusion. Obtain echocardiogram Follow neurology recommendation.    Elevated lactic acid Uncertain etiology.  No fever or leukocytosis no hypotension.  Essential hypertension Chronic diastolic heart failure with preserved EF 60 to 65% -Previous 2 D echo from 05/01/2018 showing grade 1 diastolic heart failure with preserved EF 60 to 65% hold antihypertensives for now.    Chronic bilateral lower extremity swelling. On torsemide  20 daily.  History of OSA on CPAP Continue CPAP at nighttime.   Peripheral neuropathy Continue Lyrica   Insulin-dependent DM type II Home patient takes insulin Lantus 84 units long-acting insulin in the morning and NovoLog 60 unit 3 times daily with meals.   Continue while in the hospital including sliding scale insulin.  History of chronic gout Continue allopurinol   Reactive airway disease Continue Breo and Incruse Ellipta   Hypothyroidism Continue Synthroid   No Charge  Signed,  Tenny Craw, MD [facility]

## 2023-11-23 DIAGNOSIS — R200 Anesthesia of skin: Secondary | ICD-10-CM | POA: Diagnosis not present

## 2023-11-23 DIAGNOSIS — R202 Paresthesia of skin: Secondary | ICD-10-CM | POA: Diagnosis not present

## 2023-11-23 LAB — GLUCOSE, CAPILLARY: Glucose-Capillary: 177 mg/dL — ABNORMAL HIGH (ref 70–99)

## 2023-11-23 MED ORDER — ATORVASTATIN CALCIUM 80 MG PO TABS: 80.0000 mg | ORAL_TABLET | Freq: Every day | ORAL | 2 refills | Status: DC

## 2023-11-23 NOTE — Plan of Care (Signed)
 Problem: Education: Goal: Ability to describe self-care measures that may prevent or decrease complications (Diabetes Survival Skills Education) will improve Outcome: Adequate for Discharge Goal: Individualized Educational Video(s) Outcome: Adequate for Discharge   Problem: Coping: Goal: Ability to adjust to condition or change in health will improve Outcome: Adequate for Discharge   Problem: Fluid Volume: Goal: Ability to maintain a balanced intake and output will improve Outcome: Adequate for Discharge   Problem: Health Behavior/Discharge Planning: Goal: Ability to identify and utilize available resources and services will improve Outcome: Adequate for Discharge Goal: Ability to manage health-related needs will improve Outcome: Adequate for Discharge   Problem: Metabolic: Goal: Ability to maintain appropriate glucose levels will improve Outcome: Adequate for Discharge   Problem: Nutritional: Goal: Maintenance of adequate nutrition will improve Outcome: Adequate for Discharge Goal: Progress toward achieving an optimal weight will improve Outcome: Adequate for Discharge   Problem: Skin Integrity: Goal: Risk for impaired skin integrity will decrease Outcome: Adequate for Discharge   Problem: Tissue Perfusion: Goal: Adequacy of tissue perfusion will improve Outcome: Adequate for Discharge   Problem: Education: Goal: Knowledge of disease or condition will improve Outcome: Adequate for Discharge Goal: Knowledge of secondary prevention will improve (MUST DOCUMENT ALL) Outcome: Adequate for Discharge Goal: Knowledge of patient specific risk factors will improve (DELETE if not current risk factor) Outcome: Adequate for Discharge   Problem: Ischemic Stroke/TIA Tissue Perfusion: Goal: Complications of ischemic stroke/TIA will be minimized Outcome: Adequate for Discharge   Problem: Coping: Goal: Will verbalize positive feelings about self Outcome: Adequate for  Discharge Goal: Will identify appropriate support needs Outcome: Adequate for Discharge   Problem: Health Behavior/Discharge Planning: Goal: Ability to manage health-related needs will improve Outcome: Adequate for Discharge Goal: Goals will be collaboratively established with patient/family Outcome: Adequate for Discharge   Problem: Self-Care: Goal: Ability to participate in self-care as condition permits will improve Outcome: Adequate for Discharge Goal: Verbalization of feelings and concerns over difficulty with self-care will improve Outcome: Adequate for Discharge Goal: Ability to communicate needs accurately will improve Outcome: Adequate for Discharge   Problem: Nutrition: Goal: Risk of aspiration will decrease Outcome: Adequate for Discharge Goal: Dietary intake will improve Outcome: Adequate for Discharge   Problem: Acute Rehab OT Goals (only OT should resolve) Goal: Pt. Will Perform Grooming Outcome: Adequate for Discharge Goal: Pt. Will Perform Lower Body Dressing Outcome: Adequate for Discharge Goal: Pt. Will Transfer To Toilet Outcome: Adequate for Discharge   Problem: Education: Goal: Knowledge of General Education information will improve Description: Including pain rating scale, medication(s)/side effects and non-pharmacologic comfort measures Outcome: Adequate for Discharge   Problem: Health Behavior/Discharge Planning: Goal: Ability to manage health-related needs will improve Outcome: Adequate for Discharge   Problem: Clinical Measurements: Goal: Ability to maintain clinical measurements within normal limits will improve Outcome: Adequate for Discharge Goal: Will remain free from infection Outcome: Adequate for Discharge Goal: Diagnostic test results will improve Outcome: Adequate for Discharge Goal: Respiratory complications will improve Outcome: Adequate for Discharge Goal: Cardiovascular complication will be avoided Outcome: Adequate for  Discharge   Problem: Activity: Goal: Risk for activity intolerance will decrease Outcome: Adequate for Discharge   Problem: Nutrition: Goal: Adequate nutrition will be maintained Outcome: Adequate for Discharge   Problem: Coping: Goal: Level of anxiety will decrease Outcome: Adequate for Discharge   Problem: Elimination: Goal: Will not experience complications related to bowel motility Outcome: Adequate for Discharge Goal: Will not experience complications related to urinary retention Outcome: Adequate for Discharge   Problem:  Pain Managment: Goal: General experience of comfort will improve and/or be controlled Outcome: Adequate for Discharge   Problem: Safety: Goal: Ability to remain free from injury will improve Outcome: Adequate for Discharge   Problem: Skin Integrity: Goal: Risk for impaired skin integrity will decrease Outcome: Adequate for Discharge

## 2023-11-23 NOTE — Progress Notes (Signed)
 Discharge instructions (including medications) discussed with and copy provided to patient by Gust Brooms, RN. Patient given opportunity to ask questions. Questions clarified.

## 2023-11-23 NOTE — TOC Transition Note (Signed)
 Transition of Care [facility]) - Discharge Note   Patient Details  Name: Anita James MRN: [id number] Date of Birth: 1950/12/12  [facility] of [facility]) CM/SW Contact:  Kermit Balo, RN Phone Number: 11/23/2023, 10:15 AM   Clinical Narrative:     Pt is discharging home with home health services through [facility]. Information on the AVS. [facility] will contact her for the first home visit. DME at home: CPAP/ walker She manages her own medications and denies any issues.  Family is able to provide needed transportation.  Daughter transporting home today.  Final next level of care: Home w Home Health Services Barriers to Discharge: No Barriers Identified   Patient Goals and CMS Choice   CMS Medicare.gov Compare Post Acute Care list provided to:: Patient Choice offered to / list presented to : Patient      Discharge Placement                       Discharge Plan and Services Additional resources added to the After Visit Summary for                                       Social Drivers of Health (SDOH) Interventions SDOH Screenings   Food Insecurity: No Food Insecurity (11/22/2023)  Housing: Low Risk  (11/22/2023)  Transportation Needs: No Transportation Needs (11/22/2023)  Utilities: Not At Risk (11/22/2023)  Depression (PHQ2-9): Low Risk  (10/21/2018)  Social Connections: Socially Isolated (11/22/2023)  Tobacco Use: Low Risk  (11/22/2023)     Readmission Risk Interventions     No data to display

## 2023-11-23 NOTE — Discharge Summary (Signed)
 Physician Discharge Summary  Anita James:[id number] DOB: Jun 15, 1951 DOA: 11/21/2023  PCP: [facility]  Admit date: 11/21/2023 Discharge date: 11/23/2023  Admitted From: Home  Discharge disposition: Home with home health   Recommendations for Outpatient Follow-Up:   Follow up with your primary care provider in one week.  Check CBC, BMP, magnesium in the next visit Recommend follow-up with neurology as outpatient for neuropathy in her left upper extremity.  Recommend follow-up with [facility] in 4 weeks.   Discharge Diagnosis:   Principal Problem:   Numbness and tingling of left upper extremity Active Problems:   Elevated lactic acid level   Essential hypertension   Hypothyroidism   Chronic diastolic CHF (congestive heart failure) (HCC)   OSA on CPAP   Peripheral neuropathy   Insulin dependent type 2 diabetes mellitus (HCC)   Urinary incontinence   Chronic gout   Reactive airway disease   Discharge Condition: Improved.  Diet recommendation: Low sodium, heart healthy.  Carbohydrate-modified.    Wound care: None.  Code status: Full.   History of Present Illness:   Anita James is a 73 y.o. female with medical history significant of essential hypertension, hypothyroidism, OSA on CPAP, morbid obesity, diastolic heart failure, peripheral neuropathy, insulin-dependent DM type II, and chronic urinary incontinence presented to the hospital as a code stroke.  She presented with numbness and tingling of the left arm and bilateral lower extremity swelling.  In the ED, patient was noted to be hypotensive with elevated lactate.  CBC CMP unremarkable.  EKG showed normal sinus rhythm.  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  Neurology was consulted from the ED and was admitted to the hospital for further evaluation and treatment.     Hospital Course:   Following conditions were addressed during hospitalization  as listed below,  Left-sided upper extremity tingling numbness-likely neuropathy Neurology initially recommended admission for stroke workup.  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  Neurology has recommended aspirin and statins  on discharge. MRI of the brain with no acute abnormality and no mention of subdural hygroma.  Mild age-related cerebral atrophy noted.  MRI of the cervical spine with multilevel cervical spondylosis.  CT angiogram of the head and neck showed no large vessel occlusion.  2D echocardiogram with LV ejection fraction of 60 to 65% with LVH.  Elevated lactic acid Uncertain etiology.  No fever or leukocytosis no hypotension.   Essential hypertension Chronic diastolic heart failure with preserved EF 60 to 65% -Previous 2 D echo from 05/01/2018 showing grade 1 diastolic heart failure with preserved EF 60 to 65% Zoom home medications on discharge   Chronic bilateral lower extremity swelling. On torsemide 20 milligram daily.   History of OSA on CPAP Continue CPAP at nighttime.   Peripheral neuropathy Continue Lyrica 75 twice daily.  Might benefit from adjusting the doses of Lyrica as outpatient.   Insulin-dependent DM type II Home patient takes insulin Lantus 84 units long-acting insulin in the morning and NovoLog 60 unit 3 times daily with meals.  Will resume on discharge.  History of chronic gout Continue allopurinol   Reactive airway disease Continue Breo and Incruse Ellipta   Hypothyroidism Continue Synthroid  Class III obesity.  Body mass index is 54.73 kg/m. Would benefit from ongoing weight loss as outpatient.  Disposition.  At this time, patient is stable for disposition home with outpatient PCP and neurology follow-up.  Medical Consultants:  Neurology  Procedures:    None Subjective:   Today, patient was seen and examined at bedside.  Complains of mild left upper extremity numbness mostly in the middle finger area.  Denies any dizziness  lightheadedness.  Patient's daughter at bedside.  Discharge Exam:   Vitals:   11/23/23 0716 11/23/23 0914  BP: (!) 165/70   Pulse: 79   Resp: 16   Temp: 98.4 F (36.9 C)   SpO2: 92% 90%   Vitals:   11/22/23 2322 11/23/23 0406 11/23/23 0716 11/23/23 0914  BP: (!) 162/72 (!) 144/81 (!) 165/70   Pulse: 92 93 79   Resp: 20 18 16    Temp: 98.1 F (36.7 C)  98.4 F (36.9 C)   TempSrc: Oral  Oral   SpO2: 95% 92% 92% 90%  Weight:      Height:       Body mass index is 54.73 kg/m.   General: Alert awake, not in obvious distress, morbidly obese HENT: pupils equally reacting to light,  No scleral pallor or icterus noted. Oral mucosa is moist.  Chest:  Clear breath sounds.  Diminished breath sounds bilaterally. No crackles or wheezes.  CVS: S1 &S2 heard. No murmur.  Regular rate and rhythm. Abdomen: Soft, nontender, nondistended.  Bowel sounds are heard.   Extremities: No cyanosis, clubbing or edema.  Peripheral pulses are palpable.  Mildly decreased sensation over the left forearm. Psych: Alert, awake and oriented, normal mood CNS:  No cranial nerve deficits.  Power equal in all extremities.   Skin: Warm and dry.  No rashes noted.  The results of significant diagnostics from this hospitalization (including imaging, microbiology, ancillary and laboratory) are listed below for reference.     Diagnostic Studies:   ECHOCARDIOGRAM COMPLETE Result Date: 11/22/2023    ECHOCARDIOGRAM REPORT   Patient Name:   Anita James Date of Exam: 11/22/2023 Medical Rec #:  [id number]   Height:       66.0 in Accession #:    [id number]  Weight:       339.1 lb Date of Birth:  19-Apr-1951   BSA:          2.503 m Patient Age:    72 years    BP:           169/94 mmHg Patient Gender: F           HR:           78 bpm. Exam Location:  Inpatient Procedure: 2D Echo, Color Doppler, Cardiac Doppler and Saline Contrast Bubble            Study (Both Spectral and Color Flow Doppler were utilized during            procedure).  Indications:    Stroke i63.9  History:        Patient has prior history of Echocardiogram examinations, most                 recent 08/02/2018. CHF; Risk Factors:Hypertension, Diabetes and                 Sleep Apnea.  Sonographer:    Irving Burton Senior RDCS Referring Phys: [phone] SUBRINA SUNDIL IMPRESSIONS  1. Left ventricular ejection fraction, by estimation, is 60 to 65%. The left ventricle has normal function. The left ventricle has no regional wall motion abnormalities. There is mild concentric left ventricular hypertrophy. Left ventricular diastolic parameters are consistent with Grade I diastolic dysfunction (impaired relaxation).  2. Right ventricular systolic  function is normal. The right ventricular size is normal. Tricuspid regurgitation signal is inadequate for assessing PA pressure.  3. The mitral valve is normal in structure. Trivial mitral valve regurgitation.  4. The aortic valve is tricuspid. Aortic valve regurgitation is not visualized. Aortic valve sclerosis is present, with no evidence of aortic valve stenosis.  5. The inferior vena cava is normal in size with greater than 50% respiratory variability, suggesting right atrial pressure of 3 mmHg.  6. Agitated saline contrast bubble study was negative, with no evidence of any interatrial shunt. FINDINGS  Left Ventricle: Left ventricular ejection fraction, by estimation, is 60 to 65%. The left ventricle has normal function. The left ventricle has no regional wall motion abnormalities. The left ventricular internal cavity size was normal in size. There is  mild concentric left ventricular hypertrophy. Left ventricular diastolic parameters are consistent with Grade I diastolic dysfunction (impaired relaxation). Right Ventricle: The right ventricular size is normal. No increase in right ventricular wall thickness. Right ventricular systolic function is normal. Tricuspid regurgitation signal is inadequate for assessing PA pressure. Left Atrium: Left atrial  size was normal in size. Right Atrium: Right atrial size was normal in size. Pericardium: There is no evidence of pericardial effusion. Presence of epicardial fat layer. Mitral Valve: The mitral valve is normal in structure. Mild mitral annular calcification. Trivial mitral valve regurgitation. Tricuspid Valve: The tricuspid valve is normal in structure. Tricuspid valve regurgitation is trivial. Aortic Valve: The aortic valve is tricuspid. Aortic valve regurgitation is not visualized. Aortic valve sclerosis is present, with no evidence of aortic valve stenosis. Pulmonic Valve: The pulmonic valve was normal in structure. Pulmonic valve regurgitation is not visualized. Aorta: The aortic root and ascending aorta are structurally normal, with no evidence of dilitation. Venous: The inferior vena cava is normal in size with greater than 50% respiratory variability, suggesting right atrial pressure of 3 mmHg. IAS/Shunts: No atrial level shunt detected by color flow Doppler. Agitated saline contrast was given intravenously to evaluate for intracardiac shunting. Agitated saline contrast bubble study was negative, with no evidence of any interatrial shunt.  LEFT VENTRICLE PLAX 2D LVIDd:         4.40 cm   Diastology LVIDs:         3.20 cm   LV e' medial:    7.30 cm/s LV PW:         1.10 cm   LV E/e' medial:  13.8 LV IVS:        1.10 cm   LV e' lateral:   4.97 cm/s LVOT diam:     2.10 cm   LV E/e' lateral: 20.3 LV SV:         84 LV SV Index:   33 LVOT Area:     3.46 cm  RIGHT VENTRICLE RV S prime:     14.10 cm/s TAPSE (M-mode): 2.2 cm LEFT ATRIUM             Index        RIGHT ATRIUM           Index LA diam:        3.90 cm 1.56 cm/m   RA Area:     17.00 cm LA Vol (A2C):   54.4 ml 21.74 ml/m  RA Volume:   47.00 ml  18.78 ml/m LA Vol (A4C):   58.9 ml 23.53 ml/m LA Biplane Vol: 59.6 ml 23.81 ml/m  AORTIC VALVE LVOT Vmax:   123.00 cm/s LVOT Vmean:  81.200  cm/s LVOT VTI:    0.242 m  AORTA Ao Root diam: 3.20 cm Ao Asc diam:   3.50 cm MITRAL VALVE MV Area (PHT): 2.84 cm     SHUNTS MV Decel Time: 267 msec     Systemic VTI:  0.24 m MV E velocity: 101.00 cm/s  Systemic Diam: 2.10 cm MV A velocity: 137.00 cm/s MV E/A ratio:  0.74 Arvilla Meres MD Electronically signed by Arvilla Meres MD Signature Date/Time: 11/22/2023/11:11:03 AM    Final    CT ANGIO HEAD NECK W WO CM Result Date: 11/22/2023 CLINICAL DATA:  Stroke workup EXAM: CT ANGIOGRAPHY HEAD AND NECK WITH AND WITHOUT CONTRAST TECHNIQUE: Multidetector CT imaging of the head and neck was performed using the standard protocol during bolus administration of intravenous contrast. Multiplanar CT image reconstructions and MIPs were obtained to evaluate the vascular anatomy. Carotid stenosis measurements (when applicable) are obtained utilizing NASCET criteria, using the distal internal carotid diameter as the denominator. RADIATION DOSE REDUCTION: This exam was performed according to the departmental dose-optimization program which includes automated exposure control, adjustment of the mA and/or kV according to patient size and/or use of iterative reconstruction technique. CONTRAST:  75mL OMNIPAQUE IOHEXOL 350 MG/ML SOLN COMPARISON:  Brain MRI from earlier today FINDINGS: CT HEAD FINDINGS Brain: No evidence of acute infarction, hemorrhage, hydrocephalus, extra-axial collection or mass lesion/mass effect. Vascular: Atheromatous plaque.  See below. Skull: Normal. Negative for fracture or focal lesion. Sinuses/Orbits: No acute finding. Review of the MIP images confirms the above findings CTA NECK FINDINGS Aortic arch: Mild atheromatous plaque.  Three vessel branching. Right carotid system: Partial retropharyngeal course. Atheromatous calcification at the carotid bifurcation. Left carotid system: Partial retropharyngeal course. Atheromatous calcification at the carotid bifurcation. No stenosis or ulceration. Vertebral arteries: No proximal subclavian stenosis. The left vertebral artery is  dominant. Right vertebral origin assessment is limited by the degree of motion artifact. No stenosis or beading. Skeleton: No acute or aggressive finding.  Right sphenoid sinusitis. Other neck: Negative Upper chest: Clear apical lungs Review of the MIP images confirms the above findings CTA HEAD FINDINGS Anterior circulation: No significant stenosis, proximal occlusion, aneurysm, or vascular malformation. Atheromatous plaque along the carotid siphons which is mild. Posterior circulation: Dominant left vertebral artery with atheromatous plaque. Fetal type PCA especially on the right. There is atheromatous irregularity of the posterior cerebral arteries with tandem moderate stenoses at the proximal left P2 segment. Venous sinuses: Diffusely patent Anatomic variants: None significant Review of the MIP images confirms the above findings IMPRESSION: 1. No emergent finding. 2. Atherosclerosis without flow reducing stenosis of major arteries in the neck. There is limitation in visualizing the proximal right vertebral artery due to artifact. 3. Intracranial atherosclerosis most notably affecting the left P2 segment where there is tandem moderate stenosis. Electronically Signed   By: Tiburcio Pea M.D.   On: 11/22/2023 04:32   MR Cervical Spine Wo Contrast Result Date: 11/22/2023 CLINICAL DATA:  Initial evaluation for cervical radiculopathy, left arm numbness. EXAM: MRI CERVICAL SPINE WITHOUT CONTRAST TECHNIQUE: Multiplanar, multisequence MR imaging of the cervical spine was performed. No intravenous contrast was administered. COMPARISON:  Prior CT from 04/09/2020 FINDINGS: Alignment: Examination moderately degraded by motion artifact. Straightening with mild reversal of the normal cervical lordosis. Trace degenerative anterolisthesis of C3 on C4, C4 on C5, and C7 on T1, with trace retrolisthesis of C6 on C7. Vertebrae: Mild chronic height loss noted at the superior endplates of T2 and T3. Vertebral body height otherwise  maintained with no  acute or recent fracture. Bone marrow signal intensity mildly heterogeneous with decreased T1 signal intensity, nonspecific, but most commonly related to anemia, smoking, or obesity. No discrete or worrisome osseous lesions. Mild degenerative reactive endplate changes present about the C6-7 interspace. No abnormal marrow edema. Cord: Normal signal and morphology. Posterior Fossa, vertebral arteries, paraspinal tissues: Unremarkable. Disc levels: C2-C3: Normal interspace. Mild bilateral facet degeneration. No spinal stenosis. Foramina remain patent. C3-C4: Mild disc bulge with left greater than right uncovertebral spurring. Mild to moderate left with mild right facet hypertrophy. No significant spinal stenosis. Mild left C4 foraminal narrowing. Right neural foramen remains grossly patent. C4-C5: Minimal disc bulge with uncovertebral spurring. Mild left-sided facet hypertrophy. No spinal stenosis. Mild left C5 foraminal narrowing. Right neural foramen remains patent. C5-C6: Advanced degenerative intervertebral disc space narrowing with diffuse disc osteophyte complex. Broad posterior component flattens and partially faces the ventral thecal sac. Mild spinal stenosis. Mild to moderate bilateral C6 foraminal narrowing. C6-C7: Degenerative intervertebral disc space narrowing with diffuse disc osteophyte complex. Broad posterior component flattens and partially effaces the ventral thecal sac, slightly asymmetric to the left. Mild spinal stenosis. Moderate left with mild right C7 foraminal narrowing. C7-T1: Trace anterolisthesis without significant disc bulge. Moderate right with mild left facet hypertrophy. No spinal stenosis. Foramina remain patent. T1-2: Negative interspace.  Mild facet hypertrophy.  No stenosis. T2-3: Seen only on sagittal projection. Minimal disc bulge with endplate spurring. No spinal stenosis. T3-4: Seen only on sagittal projection. Mild disc bulge with endplate spurring. No  significant stenosis. IMPRESSION: 1. Motion degraded exam. 2. Multilevel cervical spondylosis with resultant mild spinal stenosis at C5-6 and C6-7. 3. Multifactorial degenerative changes with resultant multilevel foraminal narrowing as above. Notable findings include mild left C4 and C5 foraminal stenosis, mild to moderate bilateral C6 foraminal narrowing, with moderate left and mild right C7 foraminal stenosis. Electronically Signed   By: Rise Mu M.D.   On: 11/22/2023 03:27   MR BRAIN WO CONTRAST Result Date: 11/22/2023 CLINICAL DATA:  Initial evaluation for acute neuro deficit, stroke suspected. EXAM: MRI HEAD WITHOUT CONTRAST TECHNIQUE: Multiplanar, multiecho pulse sequences of the brain and surrounding structures were obtained without intravenous contrast. COMPARISON:  CT from 11/21/2023 FINDINGS: Brain: Examination degraded by motion artifact. Mild age-related cerebral atrophy. Patchy T2/FLAIR hyperintensity involving the periventricular and deep white matter both cerebral hemispheres, most likely related chronic microvascular ischemic disease, mild for age. No evidence for acute or subacute infarct. Gray-white matter differentiation maintained. No areas of chronic cortical infarction. No acute or chronic intracranial blood products. No mass lesion, midline shift or mass effect. No hydrocephalus or extra-axial fluid collection. Previously questioned small left-sided subdural hygroma is not seen, with appearance due to slight asymmetric prominence of the subarachnoid space overlying the left cerebral convexity as compared to the right. Pituitary gland within normal limits. Vascular: Major intracranial vascular flow voids are maintained. Skull and upper cervical spine: Craniocervical junction within normal limits. Bone marrow signal intensity grossly normal. No scalp soft tissue abnormality. Sinuses/Orbits: Prior bilateral ocular lens replacement. Paranasal sinuses are largely clear. Trace  bilateral mastoid effusions noted, of doubtful significance. Negative nasopharynx. Other: None. IMPRESSION: 1. No acute intracranial abnormality. 2. Mild age-related cerebral atrophy with chronic small vessel ischemic disease. 3. Previously questioned small left-sided subdural hygroma is not seen. This was due to slight asymmetric prominence of the subarachnoid space overlying the left cerebral convexity. Electronically Signed   By: Rise Mu M.D.   On: 11/22/2023 03:16   CT HEAD CODE  STROKE WO CONTRAST Result Date: 11/21/2023 CLINICAL DATA:  Code stroke. Initial evaluation for acute neuro deficit, left-sided numbness. EXAM: CT HEAD WITHOUT CONTRAST TECHNIQUE: Contiguous axial images were obtained from the base of the skull through the vertex without intravenous contrast. RADIATION DOSE REDUCTION: This exam was performed according to the departmental dose-optimization program which includes automated exposure control, adjustment of the mA and/or kV according to patient size and/or use of iterative reconstruction technique. COMPARISON:  Prior study from 04/09/2020 FINDINGS: Brain: Cerebral volume within normal limits. No acute intracranial hemorrhage. No acute large vessel territory infarct. No mass lesion or midline shift. No hydrocephalus. 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 Vascular: No abnormal hyperdense vessel. Calcified atherosclerosis present at the skull base. Skull: Scalp soft tissues within normal limits.  Calvarium intact. Sinuses/Orbits: Globes orbital soft tissues within normal limits. Chronic right sphenoid sinus disease noted. Paranasal sinuses are otherwise clear. No significant mastoid effusion. Other: None. ASPECTS [facility] Stroke Program Early CT Score) - Ganglionic level infarction (caudate, lentiform nuclei, internal capsule, insula, M1-M3 cortex): 7 - Supraganglionic infarction (M4-M6 cortex): 3 Total  score (0-10 with 10 being normal): 10 IMPRESSION: 1. No acute intracranial abnormality. 2. ASPECTS is 10. 3. 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 No significant mass effect or midline shift. These results were communicated to Dr. Derry Lory at 10:26 pm on 11/21/2023 by text page via the [facility] messaging system. Electronically Signed   By: Rise Mu M.D.   On: 11/21/2023 22:28     Labs:   Basic Metabolic Panel: Recent Labs  Lab 11/21/23 2214  NA 142  143  K 3.8  3.8  CL 104  108  CO2 27  GLUCOSE 179*  168*  BUN 12  15  CREATININE 0.97  1.00  CALCIUM 9.2   GFR Estimated Creatinine Clearance: 80.2 mL/min (by C-G formula based on SCr of 1 mg/dL). Liver Function Tests: Recent Labs  Lab 11/21/23 2214  AST 19  ALT 14  ALKPHOS 68  BILITOT 0.6  PROT 6.5  ALBUMIN 3.3*   No results for input(s): "LIPASE", "AMYLASE" in the last 168 hours. No results for input(s): "AMMONIA" in the last 168 hours. Coagulation profile Recent Labs  Lab 11/21/23 2214  INR 1.0    CBC: Recent Labs  Lab 11/21/23 2214  WBC 6.6  NEUTROABS 4.0  HGB 13.7  12.9  HCT 41.9  38.0  MCV 91.9  PLT 191   Cardiac Enzymes: No results for input(s): "CKTOTAL", "CKMB", "CKMBINDEX", "TROPONINI" in the last 168 hours. BNP: Invalid input(s): "POCBNP" CBG: Recent Labs  Lab 11/22/23 0829 11/22/23 1211 11/22/23 1607 11/22/23 2109 11/23/23 0616  GLUCAP 146* 168* 141* 148* 177*   D-Dimer No results for input(s): "DDIMER" in the last 72 hours. Hgb A1c Recent Labs    11/22/23 0617  HGBA1C 6.7*   Lipid Profile Recent Labs    11/22/23 0617  CHOL 183  HDL 32*  LDLCALC 103*  TRIG 240*  CHOLHDL 5.7   Thyroid function studies No results for input(s): "TSH", "T4TOTAL", "T3FREE", "THYROIDAB" in the last 72 hours.  Invalid input(s): "FREET3" Anemia work up No results for input(s): "VITAMINB12", "FOLATE", "FERRITIN", "TIBC",  "IRON", "RETICCTPCT" in the last 72 hours. Microbiology No results found for this or any previous visit (from the past 240 hours).   Discharge Instructions:   Discharge Instructions     Ambulatory referral to Neurology  Complete by: As directed    Follow up any provider in about 4 weeks. Thanks.   Diet - low sodium heart healthy   Complete by: As directed    Discharge instructions   Complete by: As directed    Follow up with your primary care provider in 1 week.  Check blood work at that time.  Seek medical attention for worsening symptoms.  Discussed about getting a neurology referral if you continue to have headache and nerve symptoms including numbness.   Increase activity slowly   Complete by: As directed       Allergies as of 11/23/2023       Reactions   Amoxicillin-pot Clavulanate Nausea And Vomiting, Other (See Comments)   Combination of medications tear lining of stomach Other Reaction(s): GI Intolerance, Not available   Oxycodone Shortness Of Breath   oxycontin too   Sulfa Antibiotics Shortness Of Breath   Amoxicillin    Other Reaction(s): Unknown   Norvasc [amlodipine Besylate] Swelling   feet   Penicillins    Sulfamethoxazole Nausea Only, Other (See Comments)   Other Reaction(s): GI Intolerance   Tramadol Other (See Comments)   Unknown, toxicity. Patient advised she had to be given Narcan with this        Medication List     STOP taking these medications    Livalo 4 MG Tabs Generic drug: Pitavastatin Calcium       TAKE these medications    Accu-Chek Aviva Plus w/Device Kit USE AS DIRECTED TO TEST TWICE DAILY   Accu-Chek FastClix Lancets Misc USE AS DIRECTED TO CHECK BLOOD SUGAR THREE TIMES DAILY   Accu-Chek Guide test strip Generic drug: glucose blood USE AS DIRECTED TO CHECK BLOOD SUGAR THREE TIMES DAILY   albuterol 0.63 MG/3ML nebulizer solution Commonly known as: ACCUNEB Take 1 ampule by nebulization 3 (three) times daily as needed for  wheezing or shortness of breath.   allopurinol 100 MG tablet Commonly known as: ZYLOPRIM Take 100 mg by mouth daily.   aspirin EC 81 MG tablet Take 81 mg by mouth daily.   atorvastatin 80 MG tablet Commonly known as: LIPITOR Take 1 tablet (80 mg total) by mouth daily.   Breztri Aerosphere 160-9-4.8 MCG/ACT Aero Generic drug: Budeson-Glycopyrrol-Formoterol Inhale 2 puffs into the lungs in the morning and at bedtime.   carvedilol 25 MG tablet Commonly known as: COREG Take 25 mg by mouth 2 (two) times daily.   cetirizine 10 MG tablet Commonly known as: ZYRTEC Take 10 mg by mouth daily as needed for allergies.   diclofenac sodium 1 % Gel Commonly known as: VOLTAREN Apply 4 g topically 4 (four) times daily. What changed:  when to take this reasons to take this   feeding supplement (GLUCERNA SHAKE) Liqd Take 237 mLs by mouth every morning.   fluticasone 50 MCG/ACT nasal spray Commonly known as: FLONASE PLACE 2 SPRAYS IN EACH NOSTRIL DAILY AS NEEDED FOR ALLERGIES   Insulin Pen Needle 32G X 4 MM Misc Commonly known as: TRUEplus Pen Needles Use to inject insulin.   NovoFine 32G X 6 MM Misc Generic drug: Insulin Pen Needle SMARTSIG:1 SUB-Q 4-5 Times Daily   Jardiance 10 MG Tabs tablet Generic drug: empagliflozin Take 10 mg by mouth daily.   Lantus SoloStar 100 UNIT/ML Solostar Pen Generic drug: insulin glargine Inject 84 Units into the skin daily. What changed: how much to take   levothyroxine 100 MCG tablet Commonly known as: Synthroid Take 1 tablet (100 mcg total) by  mouth daily.   lidocaine 5 % Commonly known as: Lidoderm Place 1 patch onto the skin every 12 (twelve) hours. Remove & Discard patch within 12 hours or as directed by MD   NovoLOG FlexPen 100 UNIT/ML FlexPen Generic drug: insulin aspart Inject 60 Units into the skin 3 (three) times daily with meals. What changed:  how much to take when to take this   nystatin powder Generic drug:  nystatin APPLY TOPICALLY 4 TIMES DAILY. What changed: See the new instructions.   omeprazole 20 MG capsule Commonly known as: PRILOSEC Take 20 mg by mouth daily.   pregabalin 75 MG capsule Commonly known as: LYRICA Take 75 mg by mouth 2 (two) times daily.   torsemide 20 MG tablet Commonly known as: DEMADEX Take 20 mg by mouth daily as needed (fluid).   Zinc Acetate 50 MG Caps Take 1 capsule by mouth in the morning and at bedtime.        Follow-up Information     [facility]. Schedule an appointment as soon as possible for a visit in 1 month(s).   Specialty: Neurology Contact information: [address] [place], [address] [place] [address] [phone]                 Time coordinating discharge: 39 minutes  Signed:  Darinda [place]  [facility] 11/23/2023, 9:31 AM

## 2023-11-23 NOTE — Care Management Obs Status (Signed)
 MEDICARE OBSERVATION STATUS NOTIFICATION   Patient Details  Name: Anita James MRN: [id number] Date of Birth: 1951/02/16   Medicare Observation Status Notification Given:  Yes    Kermit Balo, RN 11/23/2023, 10:03 AM

## 2023-11-25 ENCOUNTER — Ambulatory Visit: Payer: 59 | Attending: Cardiovascular Disease | Admitting: Cardiovascular Disease

## 2023-11-25 ENCOUNTER — Encounter: Payer: Self-pay | Admitting: Neurology

## 2023-11-25 ENCOUNTER — Encounter: Payer: Self-pay | Admitting: Cardiovascular Disease

## 2023-11-25 VITALS — BP 140/82 | HR 82 | Ht 66.0 in | Wt 320.0 lb

## 2023-11-25 DIAGNOSIS — E782 Mixed hyperlipidemia: Secondary | ICD-10-CM | POA: Diagnosis not present

## 2023-11-25 DIAGNOSIS — G4733 Obstructive sleep apnea (adult) (pediatric): Secondary | ICD-10-CM

## 2023-11-25 DIAGNOSIS — I10 Essential (primary) hypertension: Secondary | ICD-10-CM | POA: Diagnosis not present

## 2023-11-25 DIAGNOSIS — I5032 Chronic diastolic (congestive) heart failure: Secondary | ICD-10-CM

## 2023-11-25 NOTE — Assessment & Plan Note (Signed)
 History of hyperlipidemia on high-dose statin therapy with lipid profile performed 12/19/2023 revealing a total cholesterol 183, LDL of 103 and HDL 32, acceptable for primary prevention.

## 2023-11-25 NOTE — Assessment & Plan Note (Signed)
 On CPAP. ?

## 2023-11-25 NOTE — Progress Notes (Signed)
 11/25/2023 Nathanial Millman   [birth year]  [id number]  Primary Physician Center, [facility] Primary Cardiologist: Runell Gess MD Nicholes Calamity, [place]  HPI:  Anita James is a 73 y.o. 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  She is accompanied by one of her daughters Anita James today.  She is a former patient of Dr. Landry Dyke.  She was referred by her primary care doctor for diastolic heart failure and lower extremity edema.  Her risk factors include treated hypertension, diabetes and hyperlipidemia.  There is no family history for heart disease.  She is never had a heart attack or stroke.  She denies chest pain.  She is somewhat limited by her morbid obesity and is here in a wheelchair.  She has obstructive sleep apnea on CPAP.  She has chronic lower extremity edema on torsemide and diastolic heart failure.   Current Meds  Medication Sig   Accu-Chek FastClix Lancets MISC USE AS DIRECTED TO CHECK BLOOD SUGAR THREE TIMES DAILY   ACCU-CHEK GUIDE test strip USE AS DIRECTED TO CHECK BLOOD SUGAR THREE TIMES DAILY   albuterol (ACCUNEB) 0.63 MG/3ML nebulizer solution Take 1 ampule by nebulization 3 (three) times daily as needed for wheezing or shortness of breath.   allopurinol (ZYLOPRIM) 100 MG tablet Take 100 mg by mouth daily.   aspirin EC 81 MG tablet Take 81 mg by mouth daily.   atorvastatin (LIPITOR) 80 MG tablet Take 1 tablet (80 mg total) by mouth daily.   Blood Glucose Monitoring Suppl (ACCU-CHEK AVIVA PLUS) w/Device KIT USE AS DIRECTED TO TEST TWICE DAILY   Budeson-Glycopyrrol-Formoterol (BREZTRI AEROSPHERE) 160-9-4.8 MCG/ACT AERO Inhale 2 puffs into the lungs in the morning and at bedtime.   carvedilol (COREG) 25 MG tablet Take 25 mg by mouth 2 (two) times daily.   cetirizine (ZYRTEC) 10 MG tablet Take 10 mg by mouth daily as needed for allergies.   diclofenac sodium (VOLTAREN) 1 % GEL  Apply 4 g topically 4 (four) times daily. (Patient taking differently: Apply 4 g topically daily as needed (pain).)   empagliflozin (JARDIANCE) 10 MG TABS tablet Take 10 mg by mouth daily.   feeding supplement, GLUCERNA SHAKE, (GLUCERNA SHAKE) LIQD Take 237 mLs by mouth every morning.   fluticasone (FLONASE) 50 MCG/ACT nasal spray PLACE 2 SPRAYS IN EACH NOSTRIL DAILY AS NEEDED FOR ALLERGIES   insulin aspart (NOVOLOG FLEXPEN) 100 UNIT/ML FlexPen Inject 60 Units into the skin 3 (three) times daily with meals. (Patient taking differently: Inject 20-45 Units into the skin 3 (three) times daily after meals.)   Insulin Glargine (LANTUS SOLOSTAR) 100 UNIT/ML Solostar Pen Inject 84 Units into the skin daily. (Patient taking differently: Inject 100 Units into the skin daily.)   Insulin Pen Needle (TRUEPLUS PEN NEEDLES) 32G X 4 MM MISC Use to inject insulin.   levothyroxine (SYNTHROID) 100 MCG tablet Take 1 tablet (100 mcg total) by mouth daily.   lidocaine (LIDODERM) 5 % Place 1 patch onto the skin every 12 (twelve) hours. Remove & Discard patch within 12 hours or as directed by MD   NOVOFINE 32G X 6 MM MISC SMARTSIG:1 SUB-Q 4-5 Times Daily   NYSTATIN powder APPLY TOPICALLY 4 TIMES DAILY. (Patient taking differently: Apply 1 Bottle topically 4 (four) times daily as needed (rash).)   omeprazole (PRILOSEC) 20 MG capsule Take 20 mg by mouth daily.   pregabalin (LYRICA) 75 MG capsule Take 75 mg by  mouth 2 (two) times daily.   torsemide (DEMADEX) 20 MG tablet Take 20 mg by mouth daily as needed (fluid).   Zinc Acetate 50 MG CAPS Take 1 capsule by mouth in the morning and at bedtime.     Allergies  Allergen Reactions   Amoxicillin-Pot Clavulanate Nausea And Vomiting and Other (See Comments)    Combination of medications tear lining of stomach  Other Reaction(s): GI Intolerance, Not available   Oxycodone Shortness Of Breath    oxycontin too   Sulfa Antibiotics Shortness Of Breath   Amoxicillin     Other  Reaction(s): Unknown   Norvasc [Amlodipine Besylate] Swelling    feet   Penicillins    Sulfamethoxazole Nausea Only and Other (See Comments)    Other Reaction(s): GI Intolerance   Tramadol Other (See Comments)    Unknown, toxicity. Patient advised she had to be given Narcan with this    Social History   Socioeconomic History   Marital status: Divorced    Spouse name: Not on file   Number of children: 6   Years of education: BA   Highest education level: Not on file  Occupational History   Occupation: unemployed  Tobacco Use   Smoking status: Never   Smokeless tobacco: Never  Vaping Use   Vaping status: Never Used  Substance and Sexual Activity   Alcohol use: No   Drug use: No   Sexual activity: Not on file  Other Topics Concern   Not on file  Social History Narrative   Not on file   Social Drivers of Health   Financial Resource Strain: Not on file  Food Insecurity: No Food Insecurity (11/22/2023)   Hunger Vital Sign    Worried About Running Out of Food in the Last Year: Never true    Ran Out of Food in the Last Year: Never true  Transportation Needs: No Transportation Needs (11/22/2023)   PRAPARE - Administrator, Civil Service (Medical): No    Lack of Transportation (Non-Medical): No  Physical Activity: Not on file  Stress: Not on file  Social Connections: Socially Isolated (11/22/2023)   Social Connection and Isolation Panel [NHANES]    Frequency of Communication with Friends and Family: More than three times a week    Frequency of Social Gatherings with Friends and Family: Once a week    Attends Religious Services: Never    Database administrator or Organizations: No    Attends Banker Meetings: Never    Marital Status: Divorced  Catering manager Violence: Not At Risk (11/22/2023)   Humiliation, Afraid, Rape, and Kick questionnaire    Fear of Current or Ex-Partner: No    Emotionally Abused: No    Physically Abused: No    Sexually Abused:  No     Review of Systems: General: negative for chills, fever, night sweats or weight changes.  Cardiovascular: negative for chest pain, dyspnea on exertion, edema, orthopnea, palpitations, paroxysmal nocturnal dyspnea or shortness of breath Dermatological: negative for rash Respiratory: negative for cough or wheezing Urologic: negative for hematuria Abdominal: negative for nausea, vomiting, diarrhea, bright red blood per rectum, melena, or hematemesis Neurologic: negative for visual changes, syncope, or dizziness All other systems reviewed and are otherwise negative except as noted above.    Blood pressure (!) 140/82, pulse 82, height 5\' 6"  (1.676 m), weight (!) 320 lb (145.2 kg), SpO2 91%.  General appearance: alert and no distress Neck: no adenopathy, no carotid bruit, no JVD,  supple, symmetrical, trachea midline, and thyroid not enlarged, symmetric, no tenderness/mass/nodules Lungs: clear to auscultation bilaterally Heart: regular rate and rhythm, S1, S2 normal, no murmur, click, rub or gallop Extremities: 1+ bilateral lower extremity edema Pulses: 2+ and symmetric Skin: Skin color, texture, turgor normal. No rashes or lesions Neurologic: Grossly normal  EKG not performed today      ASSESSMENT AND PLAN:   Essential hypertension History of essential hypertension her blood pressure measured today at 140/82.  She is on carvedilol.  Hyperlipemia History of hyperlipidemia on high-dose statin therapy with lipid profile performed 12/19/2023 revealing a total cholesterol 183, LDL of 103 and HDL 32, acceptable for primary prevention.  Morbid obesity due to excess calories (HCC) BMI 51.  Primary provider is exploring GLP-1 agonist.  OSA on CPAP On CPAP  Chronic diastolic CHF (congestive heart failure) (HCC) History of chronic diastolic heart failure with 2D echo performed 11/22/2023 revealing normal LV systolic function with grade 1 diastolic dysfunction.  She also has chronic lower  extremity edema on torsemide.  She has 1+ edema on exam today.     Runell Gess MD FACP,FACC,FAHA, [facility] 11/25/2023 3:36 PM

## 2023-11-25 NOTE — Assessment & Plan Note (Signed)
 BMI 51.  Primary provider is exploring GLP-1 agonist.

## 2023-11-25 NOTE — Assessment & Plan Note (Signed)
 History of chronic diastolic heart failure with 2D echo performed 11/22/2023 revealing normal LV systolic function with grade 1 diastolic dysfunction.  She also has chronic lower extremity edema on torsemide.  She has 1+ edema on exam today.

## 2023-11-25 NOTE — Patient Instructions (Signed)
 Medication Instructions:  Your physician recommends that you continue on your current medications as directed. Please refer to the Current Medication list given to you today.  *If you need a refill on your cardiac medications before your next appointment, please call your pharmacy*    Follow-Up: At [facility], you and your health needs are our priority.  As part of our continuing mission to provide you with exceptional heart care, we have created designated Provider Care Teams.  These Care Teams include your primary Cardiologist (physician) and Advanced Practice Providers (APPs -  Physician Assistants and Nurse Practitioners) who all work together to provide you with the care you need, when you need it.  We recommend signing up for the patient portal called "MyChart".  Sign up information is provided on this After Visit Summary.  MyChart is used to connect with patients for Virtual Visits (Telemedicine).  Patients are able to view lab/test results, encounter notes, upcoming appointments, etc.  Non-urgent messages can be sent to your provider as well.   To learn more about what you can do with MyChart, go to [email or web address].    Your next appointment:   We will see you on an as needed basis.  Provider:   Nanetta Batty, MD    Other Instructions   1st Floor: - Lobby - Registration  - Pharmacy  - Lab - Cafe  2nd Floor: - PV Lab - Diagnostic Testing (echo, CT, nuclear med)  3rd Floor: - Vacant  4th Floor: - TCTS (cardiothoracic surgery) - AFib Clinic - Structural Heart Clinic - Vascular Surgery  - Vascular Ultrasound  5th Floor: - HeartCare Cardiology (general and EP) - Clinical Pharmacy for coumadin, hypertension, lipid, weight-loss medications, and med management appointments    Valet parking services will be available as well.

## 2023-11-25 NOTE — Assessment & Plan Note (Signed)
 History of essential hypertension her blood pressure measured today at 140/82.  She is on carvedilol.

## 2023-11-26 ENCOUNTER — Telehealth: Payer: Self-pay | Admitting: Cardiovascular Disease

## 2023-11-26 NOTE — Telephone Encounter (Signed)
 Called pt on 11/26/2023 , couldn't leave a voice message to call back since voicemail not set up.   Josie LPN

## 2023-11-26 NOTE — Telephone Encounter (Signed)
 Pt wants to know if Dr Allyson Sabal will put her on a weight loss drug

## 2023-11-29 NOTE — Telephone Encounter (Signed)
 Called pt on 11/29/23. No answer and could not leave voice mail.  Josie LPN

## 2023-11-30 ENCOUNTER — Other Ambulatory Visit (HOSPITAL_COMMUNITY): Payer: Self-pay

## 2023-11-30 ENCOUNTER — Telehealth: Payer: Self-pay | Admitting: Pharmacy Technician

## 2023-11-30 NOTE — Telephone Encounter (Signed)
 Anita James, [facility]  You29 minutes ago (2:20 PM)    We will assess coverage for GLP1 for now and we will talk to patient at OV. Would you please inform patient.    Attempted to call patient, no answer voicemail not set up, unable to leave message

## 2023-11-30 NOTE — Telephone Encounter (Signed)
Referral placed for Pharm-D

## 2023-11-30 NOTE — Telephone Encounter (Signed)
 Ran test claim for ozempic. For a 28 day supply and the co-pay is 0.00 . PA is not needed at this time. Nothing saying this is a transition fill. This test claim was processed through [facility]- copay amounts may vary at other pharmacies due to pharmacy/plan contracts, or as the patient moves through the different stages of their insurance plan.

## 2023-12-01 NOTE — Telephone Encounter (Signed)
 2nd attempt to call patient, no answer voicemail not set up.

## 2023-12-02 NOTE — Telephone Encounter (Signed)
 3rd attempt to call patient, no answer, voicemail not set up  Nursing will await for patient to return call

## 2023-12-13 NOTE — Progress Notes (Unsigned)
 [facility] Clinic Note - Initial Visit   Date: 12/14/2023   Anita James MRN: [id number] DOB: December 20, 1950   Dear Dr Marland [facility], [facility]:  Thank you for your kind referral of Anita James for consultation of ***. Although her history is well known to you, please allow [place] to reiterate it for the purpose of our medical record. The patient was accompanied to the clinic by *** who also provides collateral information.     Anita James is a 73 y.o. ***-handed female with hypertension, hypothyroidism, OSA on CPAP, morbid obesity, diastolic heart failure, peripheral neuropathy, insulin-dependent DM type II, and chronic urinary incontinence presenting for evaluation of left arm tingling.   IMPRESSION/PLAN: **** Left arm paresthesias and ***.  MRI cervical spine shows multilevel cervical spondylosis, mild spinal stenosis at C5-6 and C6-7, moderate biforaminal stenosis at C6 segment.    - NCS/EMG of the left arm  Left forearm circumference sensory loss, with elbow pain, left middle finger sensory loss. Denies neck pain but complaining back pain Code Stroke CT head No acute abnormality. Small vessel disease. ASPECTS 10.    CTA head & neck left P2 segment tandem moderate stenosis. MRI C-spine- Multilevel cervical spondylosis with resultant mild spinal stenosis at C5-6 and C6-7.  Multifactorial degenerative changes with resultant multilevel foraminal narrowing as above. Notable findings include mild left C4 and C5 foraminal stenosis, mild to moderate bilateral C6 foraminal narrowing, with moderate left and mild right C7 foraminal stenosis. MRI  No acute intracranial abnormality  Return to clinic in ***  ------------------------------------------------------------- History of present illness: ***  Out-side paper records, electronic medical record, and images have been reviewed where available and summarized as: *** MRI brain 11/22/2023: 1. No acute intracranial  abnormality. 2. Mild age-related cerebral atrophy with chronic small vessel ischemic disease. 3. Previously questioned small left-sided subdural hygroma is not seen. This was due to slight asymmetric prominence of the subarachnoid space overlying the left cerebral convexity.  MRI cervical spine 11/22/2023: 1. Motion degraded exam. 2. Multilevel cervical spondylosis with resultant mild spinal stenosis at C5-6 and C6-7. 3. Multifactorial degenerative changes with resultant multilevel foraminal narrowing as above. Notable findings include mild left C4 and C5 foraminal stenosis, mild to moderate bilateral C6 foraminal narrowing, with moderate left and mild right C7 foraminal stenosis.  CTA head and neck 11/22/2023: 1. No emergent finding. 2. Atherosclerosis without flow reducing stenosis of major arteries in the neck. There is limitation in visualizing the proximal right vertebral artery due to artifact. 3. Intracranial atherosclerosis most notably affecting the left P2 segment where there is tandem moderate stenosis.   Lab Results  Component Value Date   HGBA1C 6.7 (H) 11/22/2023   Lab Results  Component Value Date   VITAMINB12 635 06/05/2014   Lab Results  Component Value Date   TSH 0.515 04/29/2018   Lab Results  Component Value Date   ESRSEDRATE 5 12/30/2011    Past Medical History:  Diagnosis Date   Abdominal pain    Anxiety    Carpal tunnel syndrome 06/14/2014   Chest pain    CHF (congestive heart failure) (HCC)    Chronic back pain    Chronic kidney disease 03/2013   failure due to meds   Diabetes mellitus type II, uncontrolled    Diverticulitis    Frequent headaches    Hiatal hernia    Hyperlipemia    Hypertension    Hypothyroidism    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   Neuropathy  Obesity    Pneumonia    Vitamin D deficiency 06/05/2014    Past Surgical History:  Procedure Laterality Date   CESAREAN SECTION     COLONOSCOPY WITH PROPOFOL N/A 07/01/2021    Procedure: COLONOSCOPY WITH PROPOFOL;  Surgeon: Kerin Salen, MD;  Location: [facility];  Service: Gastroenterology;  Laterality: N/A;   KNEE SURGERY Left    THYROID SURGERY     goiter     Medications:  Outpatient Encounter Medications as of 12/14/2023  Medication Sig Note   Accu-Chek FastClix Lancets MISC USE AS DIRECTED TO CHECK BLOOD SUGAR THREE TIMES DAILY    ACCU-CHEK GUIDE test strip USE AS DIRECTED TO CHECK BLOOD SUGAR THREE TIMES DAILY    albuterol (ACCUNEB) 0.63 MG/3ML nebulizer solution Take 1 ampule by nebulization 3 (three) times daily as needed for wheezing or shortness of breath.    allopurinol (ZYLOPRIM) 100 MG tablet Take 100 mg by mouth daily.    aspirin EC 81 MG tablet Take 81 mg by mouth daily.    atorvastatin (LIPITOR) 80 MG tablet Take 1 tablet (80 mg total) by mouth daily.    Blood Glucose Monitoring Suppl (ACCU-CHEK AVIVA PLUS) w/Device KIT USE AS DIRECTED TO TEST TWICE DAILY    Budeson-Glycopyrrol-Formoterol (BREZTRI AEROSPHERE) 160-9-4.8 MCG/ACT AERO Inhale 2 puffs into the lungs in the morning and at bedtime.    carvedilol (COREG) 25 MG tablet Take 25 mg by mouth 2 (two) times daily.    cetirizine (ZYRTEC) 10 MG tablet Take 10 mg by mouth daily as needed for allergies.    diclofenac sodium (VOLTAREN) 1 % GEL Apply 4 g topically 4 (four) times daily. (Patient taking differently: Apply 4 g topically daily as needed (pain).)    empagliflozin (JARDIANCE) 10 MG TABS tablet Take 10 mg by mouth daily.    feeding supplement, GLUCERNA SHAKE, (GLUCERNA SHAKE) LIQD Take 237 mLs by mouth every morning.    fluticasone (FLONASE) 50 MCG/ACT nasal spray PLACE 2 SPRAYS IN EACH NOSTRIL DAILY AS NEEDED FOR ALLERGIES    insulin aspart (NOVOLOG FLEXPEN) 100 UNIT/ML FlexPen Inject 60 Units into the skin 3 (three) times daily with meals. (Patient taking differently: Inject 20-45 Units into the skin 3 (three) times daily after meals.)    Insulin Glargine (LANTUS SOLOSTAR) 100 UNIT/ML  Solostar Pen Inject 84 Units into the skin daily. (Patient taking differently: Inject 100 Units into the skin daily.)    Insulin Pen Needle (TRUEPLUS PEN NEEDLES) 32G X 4 MM MISC Use to inject insulin.    levothyroxine (SYNTHROID) 100 MCG tablet Take 1 tablet (100 mcg total) by mouth daily. 06/30/2021: BRAND NAME ONLY    lidocaine (LIDODERM) 5 % Place 1 patch onto the skin every 12 (twelve) hours. Remove & Discard patch within 12 hours or as directed by MD    NOVOFINE 32G X 6 MM MISC SMARTSIG:1 SUB-Q 4-5 Times Daily    NYSTATIN powder APPLY TOPICALLY 4 TIMES DAILY. (Patient taking differently: Apply 1 Bottle topically 4 (four) times daily as needed (rash).)    omeprazole (PRILOSEC) 20 MG capsule Take 20 mg by mouth daily.    pregabalin (LYRICA) 75 MG capsule Take 75 mg by mouth 2 (two) times daily.    torsemide (DEMADEX) 20 MG tablet Take 20 mg by mouth daily as needed (fluid).    Zinc Acetate 50 MG CAPS Take 1 capsule by mouth in the morning and at bedtime.    No facility-administered encounter medications on file as of 12/14/2023.  Allergies:  Allergies  Allergen Reactions   Amoxicillin-Pot Clavulanate Nausea And Vomiting and Other (See Comments)    Combination of medications tear lining of stomach  Other Reaction(s): GI Intolerance, Not available   Oxycodone Shortness Of Breath    oxycontin too   Sulfa Antibiotics Shortness Of Breath   Amoxicillin     Other Reaction(s): Unknown   Norvasc [Amlodipine Besylate] Swelling    feet   Penicillins    Sulfamethoxazole Nausea Only and Other (See Comments)    Other Reaction(s): GI Intolerance   Tramadol Other (See Comments)    Unknown, toxicity. Patient advised she had to be given Narcan with this    Family History: Family History  Problem Relation Age of Onset   Cancer Mother        cervical- mets   Breast cancer Mother    Cancer Father    Heart disease Father    Diabetes Father    Diabetes Sister    Breast cancer Sister     Heart disease Sister    Breast cancer Sister    Heart disease Sister    Breast cancer Sister    Breast cancer Sister    COPD Sister    Breast cancer Sister    Colon cancer Neg Hx    Esophageal cancer Neg Hx    Rectal cancer Neg Hx    Stomach cancer Neg Hx     Social History: Social History   Tobacco Use   Smoking status: Never   Smokeless tobacco: Never  Vaping Use   Vaping status: Never Used  Substance Use Topics   Alcohol use: No   Drug use: No   Social History   Social History Narrative   Not on file    Vital Signs:  There were no vitals taken for this visit.   General Medical Exam:  *** General:  Well appearing, comfortable.   Eyes/ENT: see cranial nerve examination.   Neck:   No carotid bruits. Respiratory:  Clear to auscultation, good air entry bilaterally.   Cardiac:  Regular rate and rhythm, no murmur.   Extremities:  No deformities, edema, or skin discoloration.  Skin:  No rashes or lesions.  Neurological Exam: MENTAL STATUS including orientation to time, place, person, recent and remote memory, attention span and concentration, language, and fund of knowledge is ***normal.  Speech is not dysarthric.  CRANIAL NERVES: II:  No visual field defects.  ***   III-IV-VI: Pupils equal round and reactive to light.  Normal conjugate, extra-ocular eye movements in all directions of gaze.  No nystagmus.  No ptosis***.   V:  Normal facial sensation.    VII:  Normal facial symmetry and movements.   VIII:  Normal hearing and vestibular function.   IX-X:  Normal palatal movement.   XI:  Normal shoulder shrug and head rotation.   XII:  Normal tongue strength and range of motion, no deviation or fasciculation.  MOTOR:  No atrophy, fasciculations or abnormal movements.  No pronator drift.   Upper Extremity:  Right  Left  Deltoid  5/5   5/5   Biceps  5/5   5/5   Triceps  5/5   5/5   Infraspinatus 5/5  5/5  Medial pectoralis 5/5  5/5  Wrist extensors  5/5   5/5    Wrist flexors  5/5   5/5   Finger extensors  5/5   5/5   Finger flexors  5/5   5/5   Dorsal interossei  5/5   5/5   Abductor pollicis  5/5   5/5   Tone (Ashworth scale)  0  0   Lower Extremity:  Right  Left  Hip flexors  5/5   5/5   Hip extensors  5/5   5/5   Adductor 5/5  5/5  Abductor 5/5  5/5  Knee flexors  5/5   5/5   Knee extensors  5/5   5/5   Dorsiflexors  5/5   5/5   Plantarflexors  5/5   5/5   Toe extensors  5/5   5/5   Toe flexors  5/5   5/5   Tone (Ashworth scale)  0  0   MSRs:                                           Right        Left brachioradialis 2+  2+  biceps 2+  2+  triceps 2+  2+  patellar 2+  2+  ankle jerk 2+  2+  Hoffman no  no  plantar response down  down   SENSORY:  Normal and symmetric perception of light touch, pinprick, vibration, and proprioception.  Romberg's sign absent.   COORDINATION/GAIT: Normal finger-to- nose-finger***.  Intact rapid alternating movements bilaterally.  Able to rise from a chair without using arms.  Gait narrow based and stable. Tandem and stressed gait intact.    ***   Thank you for allowing me to participate in patient's care.  If I can answer any additional questions, I would be pleased to do so.    Sincerely,    Fleetwood Pierron K. Allena Katz, DO

## 2023-12-14 ENCOUNTER — Other Ambulatory Visit: Payer: Self-pay

## 2023-12-14 ENCOUNTER — Ambulatory Visit: Admitting: Neurology

## 2023-12-14 ENCOUNTER — Encounter: Payer: Self-pay | Admitting: Neurology

## 2023-12-14 VITALS — BP 132/69 | HR 81 | Ht 66.0 in | Wt 325.0 lb

## 2023-12-14 DIAGNOSIS — G5622 Lesion of ulnar nerve, left upper limb: Secondary | ICD-10-CM

## 2023-12-14 DIAGNOSIS — M5412 Radiculopathy, cervical region: Secondary | ICD-10-CM

## 2023-12-14 DIAGNOSIS — R200 Anesthesia of skin: Secondary | ICD-10-CM

## 2023-12-14 NOTE — Patient Instructions (Signed)
 We will refer you to start physical therapy  Nerve testing of the left arm  ELECTROMYOGRAM AND NERVE CONDUCTION STUDIES (EMG/NCS) INSTRUCTIONS  How to Prepare The neurologist conducting the EMG will need to know if you have certain medical conditions. Tell the neurologist and other EMG lab personnel if you: Have a pacemaker or any other electrical medical device Take blood-thinning medications Have hemophilia, a blood-clotting disorder that causes prolonged bleeding Bathing Take a shower or bath shortly before your exam in order to remove oils from your skin. Don't apply lotions or creams before the exam.  What to Expect You'll likely be asked to change into a hospital gown for the procedure and lie down on an examination table. The following explanations can help you understand what will happen during the exam.  Electrodes. The neurologist or a technician places surface electrodes at various locations on your skin depending on where you're experiencing symptoms. Or the neurologist may insert needle electrodes at different sites depending on your symptoms.  Sensations. The electrodes will at times transmit a tiny electrical current that you may feel as a twinge or spasm. The needle electrode may cause discomfort or pain that usually ends shortly after the needle is removed. If you are concerned about discomfort or pain, you may want to talk to the neurologist about taking a short break during the exam.  Instructions. During the needle EMG, the neurologist will assess whether there is any spontaneous electrical activity when the muscle is at rest - activity that isn't present in healthy muscle tissue - and the degree of activity when you slightly contract the muscle.  He or she will give you instructions on resting and contracting a muscle at appropriate times. Depending on what muscles and nerves the neurologist is examining, he or she may ask you to change positions during the exam.  After your  EMG You may experience some temporary, minor bruising where the needle electrode was inserted into your muscle. This bruising should fade within several days. If it persists, contact your primary care doctor.

## 2023-12-14 NOTE — Addendum Note (Signed)
 Addended by: Lenise Herald on: 12/14/2023 09:28 AM   Modules accepted: Orders

## 2023-12-16 ENCOUNTER — Emergency Department (HOSPITAL_BASED_OUTPATIENT_CLINIC_OR_DEPARTMENT_OTHER)

## 2023-12-16 ENCOUNTER — Other Ambulatory Visit: Payer: Self-pay

## 2023-12-16 ENCOUNTER — Encounter (HOSPITAL_BASED_OUTPATIENT_CLINIC_OR_DEPARTMENT_OTHER): Payer: Self-pay | Admitting: Emergency Medicine

## 2023-12-16 ENCOUNTER — Emergency Department (HOSPITAL_BASED_OUTPATIENT_CLINIC_OR_DEPARTMENT_OTHER)
Admission: EM | Admit: 2023-12-16 | Discharge: 2023-12-16 | Disposition: A | Discharge status: Home / Self Care | Attending: Emergency Medicine | Admitting: Emergency Medicine

## 2023-12-16 DIAGNOSIS — Z794 Long term (current) use of insulin: Secondary | ICD-10-CM | POA: Insufficient documentation

## 2023-12-16 DIAGNOSIS — I10 Essential (primary) hypertension: Secondary | ICD-10-CM | POA: Insufficient documentation

## 2023-12-16 DIAGNOSIS — R300 Dysuria: Secondary | ICD-10-CM | POA: Diagnosis not present

## 2023-12-16 DIAGNOSIS — R3915 Urgency of urination: Secondary | ICD-10-CM | POA: Diagnosis not present

## 2023-12-16 DIAGNOSIS — Z79899 Other long term (current) drug therapy: Secondary | ICD-10-CM | POA: Diagnosis not present

## 2023-12-16 DIAGNOSIS — R109 Unspecified abdominal pain: Secondary | ICD-10-CM | POA: Diagnosis not present

## 2023-12-16 DIAGNOSIS — R030 Elevated blood-pressure reading, without diagnosis of hypertension: Secondary | ICD-10-CM

## 2023-12-16 LAB — CBC WITH DIFFERENTIAL/PLATELET
Abs Immature Granulocytes: 0.21 10*3/uL — ABNORMAL HIGH (ref 0.00–0.07)
Basophils Absolute: 0 10*3/uL (ref 0.0–0.1)
Basophils Relative: 0 %
Eosinophils Absolute: 0 10*3/uL (ref 0.0–0.5)
Eosinophils Relative: 0 %
HCT: 41.8 % (ref 36.0–46.0)
Hemoglobin: 14.3 g/dL (ref 12.0–15.0)
Immature Granulocytes: 2 %
Lymphocytes Relative: 9 %
Lymphs Abs: 0.8 10*3/uL (ref 0.7–4.0)
MCH: 30.6 pg (ref 26.0–34.0)
MCHC: 34.2 g/dL (ref 30.0–36.0)
MCV: 89.3 fL (ref 80.0–100.0)
Monocytes Absolute: 0.1 10*3/uL (ref 0.1–1.0)
Monocytes Relative: 1 %
Neutro Abs: 7.5 10*3/uL (ref 1.7–7.7)
Neutrophils Relative %: 88 %
Platelets: 193 10*3/uL (ref 150–400)
RBC: 4.68 MIL/uL (ref 3.87–5.11)
RDW: 14.2 % (ref 11.5–15.5)
WBC: 8.7 10*3/uL (ref 4.0–10.5)
nRBC: 0 % (ref 0.0–0.2)

## 2023-12-16 LAB — URINALYSIS, MICROSCOPIC (REFLEX)

## 2023-12-16 LAB — URINALYSIS, ROUTINE W REFLEX MICROSCOPIC
Bilirubin Urine: NEGATIVE
Glucose, UA: >=500 mg/dL — AB
Ketones, ur: NEGATIVE mg/dL
Leukocytes,Ua: NEGATIVE
Nitrite: NEGATIVE
Protein, ur: >=300 mg/dL — AB
Specific Gravity, Urine: 1.015 (ref 1.005–1.030)
pH: 6 (ref 5.0–8.0)

## 2023-12-16 LAB — COMPREHENSIVE METABOLIC PANEL WITH GFR
ALT: 21 U/L (ref 0–44)
AST: 28 U/L (ref 15–41)
Albumin: 3.9 g/dL (ref 3.5–5.0)
Alkaline Phosphatase: 77 U/L (ref 38–126)
Anion gap: 14 (ref 5–15)
BUN: 24 mg/dL — ABNORMAL HIGH (ref 8–23)
CO2: 24 mmol/L (ref 22–32)
Calcium: 9.2 mg/dL (ref 8.9–10.3)
Chloride: 102 mmol/L (ref 98–111)
Creatinine, Ser: 1.11 mg/dL — ABNORMAL HIGH (ref 0.44–1.00)
GFR, Estimated: 53 mL/min — ABNORMAL LOW (ref >60)
Glucose, Bld: 286 mg/dL — ABNORMAL HIGH (ref 70–99)
Potassium: 4.2 mmol/L (ref 3.5–5.1)
Sodium: 140 mmol/L (ref 135–145)
Total Bilirubin: 1 mg/dL (ref 0.0–1.2)
Total Protein: 7.8 g/dL (ref 6.5–8.1)

## 2023-12-16 MED ORDER — FENTANYL CITRATE PF 50 MCG/ML IJ SOSY
50.0000 ug | PREFILLED_SYRINGE | Freq: Once | INTRAMUSCULAR | Status: AC
  Administered 2023-12-16: 50 ug via INTRAVENOUS
  Filled 2023-12-16: qty 1

## 2023-12-16 MED ORDER — SODIUM CHLORIDE 0.9 % IV BOLUS
1000.0000 mL | Freq: Once | INTRAVENOUS | Status: AC
  Administered 2023-12-16: 1000 mL via INTRAVENOUS

## 2023-12-16 MED ORDER — LIDOCAINE 5 % EX PTCH
1.0000 | MEDICATED_PATCH | CUTANEOUS | Status: DC
  Administered 2023-12-16: 1 via TRANSDERMAL
  Filled 2023-12-16: qty 1

## 2023-12-16 MED ORDER — KETOROLAC TROMETHAMINE 15 MG/ML IJ SOLN
15.0000 mg | Freq: Once | INTRAMUSCULAR | Status: AC
  Administered 2023-12-16: 15 mg via INTRAMUSCULAR
  Filled 2023-12-16: qty 1

## 2023-12-16 NOTE — ED Provider Notes (Signed)
 [place] [facility] Provider Note   CSN: [id number] Arrival date & time: 12/16/23  1435     History Chief Complaint  Patient presents with   Dysuria   Flank Pain    Anita James is a 73 y.o. female patient with history of hyperlipidemia, hypertension, chronic kidney disease who presents to the emergency department today for further evaluation of left-sided flank pain has been ongoing for the last couple of days.  Pain does wrap around into the abdomen.  She reports associated urinary urgency and dysuria but denies any hematuria or urinary frequency.  She denies fever, chills, nausea, vomiting.  Patient states this feels similar to when she had a kidney stone in the past.   Dysuria Associated symptoms: flank pain   Flank Pain       Home Medications Prior to Admission medications   Medication Sig Start Date End Date Taking? Authorizing Provider  Accu-Chek FastClix Lancets MISC USE AS DIRECTED TO CHECK BLOOD SUGAR THREE TIMES DAILY 11/07/19   Marcine Matar, MD  ACCU-CHEK GUIDE test strip USE AS DIRECTED TO CHECK BLOOD SUGAR THREE TIMES DAILY 10/04/19   Marcine Matar, MD  albuterol (ACCUNEB) 0.63 MG/3ML nebulizer solution Take 1 ampule by nebulization 3 (three) times daily as needed for wheezing or shortness of breath. 01/07/21   [provider]  allopurinol (ZYLOPRIM) 100 MG tablet Take 100 mg by mouth daily.    [provider]  aspirin EC 81 MG tablet Take 81 mg by mouth daily. 11/20/16   [provider]  atorvastatin (LIPITOR) 80 MG tablet Take 1 tablet (80 mg total) by mouth daily. 11/23/23   Pokhrel, Rebekah [place], MD  Blood Glucose Monitoring Suppl (ACCU-CHEK AVIVA PLUS) w/Device KIT USE AS DIRECTED TO TEST TWICE DAILY 04/27/18   Marcine Matar, MD  Budeson-Glycopyrrol-Formoterol (BREZTRI AEROSPHERE) 160-9-4.8 MCG/ACT AERO Inhale 2 puffs into the lungs in the morning and at bedtime.    [provider]   carvedilol (COREG) 25 MG tablet Take 25 mg by mouth 2 (two) times daily.    [provider]  cetirizine (ZYRTEC) 10 MG tablet Take 10 mg by mouth daily as needed for allergies.    [provider]  diclofenac sodium (VOLTAREN) 1 % GEL Apply 4 g topically 4 (four) times daily. Patient taking differently: Apply 4 g topically daily as needed (pain). 02/27/19   Melene Plan, DO  empagliflozin (JARDIANCE) 10 MG TABS tablet Take 10 mg by mouth daily.    [provider]  feeding supplement, GLUCERNA SHAKE, (GLUCERNA SHAKE) LIQD Take 237 mLs by mouth every morning.    [provider]  fluticasone (FLONASE) 50 MCG/ACT nasal spray PLACE 2 SPRAYS IN EACH NOSTRIL DAILY AS NEEDED FOR ALLERGIES 07/21/19   Marcine Matar, MD  insulin aspart (NOVOLOG FLEXPEN) 100 UNIT/ML FlexPen Inject 60 Units into the skin 3 (three) times daily with meals. Patient taking differently: Inject 20-45 Units into the skin 3 (three) times daily after meals. 12/05/19   Marcine Matar, MD  Insulin Glargine (LANTUS SOLOSTAR) 100 UNIT/ML Solostar Pen Inject 84 Units into the skin daily. Patient taking differently: Inject 100 Units into the skin daily. 10/13/19   Marcine Matar, MD  Insulin Pen Needle (TRUEPLUS PEN NEEDLES) 32G X 4 MM MISC Use to inject insulin. 10/13/18   Marcine Matar, MD  levothyroxine (SYNTHROID) 100 MCG tablet Take 1 tablet (100 mcg total) by mouth daily. 10/13/19   Anita James  B, MD  lidocaine (LIDODERM) 5 % Place 1 patch onto the skin every 12 (twelve) hours. Remove & Discard patch within 12 hours or as directed by MD 10/19/23   Burna Forts, MD  NOVOFINE 32G X 6 MM MISC SMARTSIG:1 SUB-Q 4-5 Times Daily 01/22/20   [provider]  NYSTATIN powder APPLY TOPICALLY 4 TIMES DAILY. Patient taking differently: Apply 1 Bottle topically 4 (four) times daily as needed (rash). 01/18/17   Pete Glatter, MD  omeprazole (PRILOSEC) 20 MG capsule Take 20 mg by mouth daily.     [provider]  pregabalin (LYRICA) 75 MG capsule Take 75 mg by mouth 2 (two) times daily.    [provider]  torsemide (DEMADEX) 20 MG tablet Take 20 mg by mouth daily as needed (fluid). 05/09/18   [provider]  Zinc Acetate 50 MG CAPS Take 1 capsule by mouth in the morning and at bedtime.    [provider]      Allergies    Amoxicillin-pot clavulanate, Oxycodone, Sulfa antibiotics, Amoxicillin, Norvasc [amlodipine besylate], Penicillins, Sulfamethoxazole, and Tramadol    Review of Systems   Review of Systems  Genitourinary:  Positive for dysuria and flank pain.  All other systems reviewed and are negative.   Physical Exam Updated Vital Signs BP (!) 197/85 (BP Location: Left Wrist)   Pulse 86   Temp 97.6 F (36.4 C) (Oral)   Resp 18   Ht 5\' 6"  (1.676 m)   Wt (!) 145.2 kg   SpO2 95%   BMI 51.65 kg/m  Physical Exam Vitals and nursing note reviewed.  Constitutional:      General: She is not in acute distress.    Appearance: Normal appearance. She is obese.  HENT:     Head: Normocephalic and atraumatic.  Eyes:     General:        Right eye: No discharge.        Left eye: No discharge.  Cardiovascular:     Comments: Regular rate and rhythm.  S1/S2 are distinct without any evidence of murmur, rubs, or gallops.  Radial pulses are 2+ bilaterally.  Dorsalis pedis pulses are 2+ bilaterally.  No evidence of pedal edema. Pulmonary:     Comments: Clear to auscultation bilaterally.  Normal effort.  No respiratory distress.  No evidence of wheezes, rales, or rhonchi heard throughout. Abdominal:     General: Abdomen is flat. Bowel sounds are normal. There is no distension.     Tenderness: There is no abdominal tenderness. There is left CVA tenderness. There is no right CVA tenderness, guarding or rebound.  Musculoskeletal:        General: Normal range of motion.     Cervical back: Neck supple.  Skin:    General: Skin is warm and dry.      Findings: No rash.  Neurological:     General: No focal deficit present.     Mental Status: She is alert.  Psychiatric:        Mood and Affect: Mood normal.        Behavior: Behavior normal.     ED Results / Procedures / Treatments   Labs (all labs ordered are listed, but only abnormal results are displayed) Labs Reviewed  URINALYSIS, ROUTINE W REFLEX MICROSCOPIC - Abnormal; Notable for the following components:      Result Value   Glucose, UA >=500 (*)    Hgb urine dipstick TRACE (*)    Protein, ur >=300 (*)  All other components within normal limits  CBC WITH DIFFERENTIAL/PLATELET - Abnormal; Notable for the following components:   Abs Immature Granulocytes 0.21 (*)    All other components within normal limits  COMPREHENSIVE METABOLIC PANEL WITH GFR - Abnormal; Notable for the following components:   Glucose, Bld 286 (*)    BUN 24 (*)    Creatinine, Ser 1.11 (*)    GFR, Estimated 53 (*)    All other components within normal limits  URINALYSIS, MICROSCOPIC (REFLEX) - Abnormal; Notable for the following components:   Bacteria, UA RARE (*)    All other components within normal limits    EKG None  Radiology CT Renal Stone Study Result Date: 12/16/2023 CLINICAL DATA:  Left flank pain. EXAM: CT ABDOMEN AND PELVIS WITHOUT CONTRAST TECHNIQUE: Multidetector CT imaging of the abdomen and pelvis was performed following the standard protocol without IV contrast. RADIATION DOSE REDUCTION: This exam was performed according to the departmental dose-optimization program which includes automated exposure control, adjustment of the mA and/or kV according to patient size and/or use of iterative reconstruction technique. COMPARISON:  May 01, 2023. FINDINGS: Lower chest: No acute abnormality. Hepatobiliary: Hepatic parenchyma is mildly heterogeneous. No gallstones, gallbladder wall thickening, or biliary dilatation. Pancreas: Unremarkable. No pancreatic ductal dilatation or surrounding  inflammatory changes. Spleen: Normal in size without focal abnormality. Adrenals/Urinary Tract: Adrenal glands are unremarkable. Kidneys are normal, without renal calculi, focal lesion, or hydronephrosis. Bladder is unremarkable. Stomach/Bowel: The stomach is unremarkable. The appendix is unremarkable. Moderate size supraumbilical ventral hernia is noted which contains a portion of transverse colon, but does not result in obstruction. No inflammation is noted. Vascular/Lymphatic: Aortic atherosclerosis. No enlarged abdominal or pelvic lymph nodes. Reproductive: Uterus and bilateral adnexa are unremarkable. Other: No ascites is noted. Musculoskeletal: No acute or significant osseous findings. IMPRESSION: Moderate size supraumbilical ventral hernia is noted which contains a portion of transverse colon, but does not result in obstruction. This is mildly enlarged compared to prior exam. Mildly heterogeneous hepatic parenchyma is noted which may simply represent artifact, but underlying neoplasm cannot be excluded. Further evaluation with ultrasound or CT scan with intravenous contrast is recommended. Electronically Signed   By: Lupita Raider M.D.   On: 12/16/2023 18:50    Procedures Procedures    Medications Ordered in ED Medications  fentaNYL (SUBLIMAZE) injection 50 mcg (has no administration in time range)  sodium chloride 0.9 % bolus 1,000 mL (0 mLs Intravenous Stopped 12/16/23 1707)  ketorolac (TORADOL) 15 MG/ML injection 15 mg (15 mg Intramuscular Given 12/16/23 1603)    ED Course/ Medical Decision Making/ A&P Clinical Course as of 12/16/23 1905  Thu Dec 16, 2023  1858 I would the patient some fentanyl.  I went over all labs and imaging with her at the bedside.  We went over the recommendation to get a right upper quadrant ultrasound which I have ordered.  Patient is in agreement.  [CF]  1903 CBC with Differential(!) Negative. [CF]  1903 Comprehensive metabolic panel(!) Slight elevation in  creatinine.  Glucose is elevated. [CF]  1903 Urinalysis, Routine w reflex microscopic -Urine, Clean Catch(!) No evidence of UTI. [CF]    Clinical Course User Index [CF] Teressa Lower, PA-C   {   Click here for ABCD2, HEART and other calculators  Medical Decision Making Sundus Pete is a 73 y.o. female patient who presents to the emergency department today for further evaluation of left-sided flank pain.  I am suspicious for kidney stone.  Also considering pyelonephritis,  referred pain from cystitis.  Patient tolerating p.o. fluids now.  Will plan to give her a liter of fluid in addition to some pain medicine.  Apart from some high blood pressure here rest of her vitals are normal.  All labs were discussed in ED course.  Right upper quadrant ultrasound is pending.  Due to shift change, the rest of her care will be transferred to oncoming provider for ultimate disposition is made.  If right upper quadrant ultrasound is negative I do feel the patient could likely go home with some pain control likely muscle relaxers as this is likely muscular concern at this is not a ureterolithiasis or UTI or pyelonephritis.  Disposition to be made pending imaging studies.  Amount and/or Complexity of Data Reviewed Labs: ordered. Decision-making details documented in ED Course. Radiology: ordered.  Risk Prescription drug management.    Final Clinical Impression(s) / ED Diagnoses Final diagnoses:  None    Rx / DC Orders ED Discharge Orders     None         Teressa Lower, PA-C 12/16/23 1905    Virgina Norfolk, DO 12/16/23 2118

## 2023-12-16 NOTE — Discharge Instructions (Addendum)
 Blood pressure was elevated.  Please go home and take your blood pressure medicine as discussed.  Please follow-up with primary care for today's visit.  I would recommend taking Tylenol 1000 mg every 6 hours.  You can also use lidocaine patch over the area of pain.  I have sent Flexeril to your pharmacy. You should not drive or operate heavy machinery if taking this medication. Please take as needed.  Continue to use heating pad over area of pain.    Return to ER with new or worsening symptoms.

## 2023-12-16 NOTE — ED Triage Notes (Signed)
 Pt POV in personal rolling walker- pt c/o possible kidney stone, possible UTI.  Reports L flank pain. Denies hematuria. Reports pressure with urination, increased frequency.   Hx of kidney stones, feels similar.

## 2023-12-17 ENCOUNTER — Encounter: Admitting: Neurology

## 2023-12-18 NOTE — ED Provider Notes (Signed)
 Got a call from pharmacy.  Patient was supposed to get a prescription for Flexeril from her visit on 3/27.  The pharmacy did not receive it.  Verbally called in the prescription to the pharmacy.   Rolan Bucco, MD 12/18/23 1556

## 2023-12-23 ENCOUNTER — Ambulatory Visit (INDEPENDENT_AMBULATORY_CARE_PROVIDER_SITE_OTHER): Admitting: Neurology

## 2023-12-23 DIAGNOSIS — R200 Anesthesia of skin: Secondary | ICD-10-CM | POA: Diagnosis not present

## 2023-12-23 DIAGNOSIS — E1142 Type 2 diabetes mellitus with diabetic polyneuropathy: Secondary | ICD-10-CM

## 2023-12-23 DIAGNOSIS — M5412 Radiculopathy, cervical region: Secondary | ICD-10-CM

## 2023-12-23 NOTE — Procedures (Signed)
  [facility]  [address] [place], [address]  [place], [place] [address] Tel: [phone] Fax: [phone] Test Date:  12/23/2023  Patient: Anita James DOB: July 16, 1951 Physician: Nita Sickle, DO  Sex: Female Height: 5\' 6"  Ref Phys: Nita Sickle, DO  ID#: [id number]   Technician:    History: This is a 73 year old female with history of diabetes mellitus referred for evaluation of left arm paresthesias and pain.  NCV & EMG Findings: Extensive electrodiagnostic testing of the left upper extremity shows:  Left median and ulnar sensory responses were absent.  Left radial sensory response shows mildly prolonged latency (3.4 ms) with a low/normal amplitude.   Left median motor response shows prolonged onset latency (7.5 ms), reduced amplitude (2.0 mV), and decreased conduction velocity (Elbow-Wrist, 43 m/s).  Left ulnar motor response shows reduced amplitude and slowed conduction velocity along the course of the nerve (B Elbow-Wrist, 41 m/s, A Elbow-B Elbow, 37 m/s).   Chronic motor axonal loss changes are seen affecting the distal hand muscles as well as the C5-C6 myotome, without accompanying active denervation.    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 Chronic C5-C6 radiculopathy affecting the left upper extremity, moderate.   ___________________________ Nita Sickle, DO    Nerve Conduction Studies   Stim Site NR Peak (ms) Norm Peak (ms) O-P Amp (V) Norm O-P Amp  Left Median Anti Sensory (2nd Digit)  32 C  Wrist *NR  <3.8  >10  Left Radial Anti Sensory (Base 1st Digit)  32 C  Wrist    *3.4 <2.8 11.0 >10  Left Ulnar Anti Sensory (5th Digit)  32 C  Wrist *NR  <3.2  >5     Stim Site NR Onset (ms) Norm Onset (ms) O-P Amp (mV) Norm O-P Amp Site1 Site2 Delta-0 (ms) Dist (cm) Vel (m/s) Norm Vel (m/s)  Left Median Motor (Abd Poll Brev)  32 C  Wrist    *7.5 <4.0 *2.0 >5 Elbow Wrist 7.0 30.0 *43 >50  Elbow    14.5   1.2         Left Ulnar Motor (Abd Dig Minimi)  32 C  Wrist    3.0 <3.1 *6.5 >7 B Elbow Wrist 5.4 22.0 *41 >50  B Elbow    8.4  4.9  A Elbow B Elbow 2.7 10.0 *37 >50  A Elbow    11.1  4.5          Electromyography   Side Muscle Ins.Act Fibs Fasc Recrt Amp Dur Poly Activation Comment  Left 1stDorInt Nml Nml Nml *2- *1+ *1+ *1+ Nml N/A  Left Abd Poll Brev Nml Nml Nml *2- *1+ *1+ *1+ Nml N/A  Left PronatorTeres Nml Nml Nml Nml Nml Nml Nml Nml N/A  Left Biceps Nml Nml Nml *1- *1+ *1+ *1+ Nml N/A  Left Triceps Nml Nml Nml Nml Nml Nml Nml Nml N/A  Left Deltoid Nml Nml Nml *1- *1+ *1+ *1+ Nml N/A  Left FlexCarpiUln Nml Nml Nml Nml Nml Nml Nml Nml N/A  Left Ext Indicis Nml Nml Nml Nml Nml Nml Nml Nml N/A      Waveforms:

## 2023-12-24 ENCOUNTER — Telehealth: Payer: Self-pay | Admitting: Cardiovascular Disease

## 2023-12-24 ENCOUNTER — Ambulatory Visit: Attending: Internal Medicine | Admitting: Pharmacist Clinician (PhC)/ Clinical Pharmacy Specialist

## 2023-12-24 ENCOUNTER — Other Ambulatory Visit (HOSPITAL_COMMUNITY): Payer: Self-pay

## 2023-12-24 ENCOUNTER — Encounter: Payer: Self-pay | Admitting: Pharmacist Clinician (PhC)/ Clinical Pharmacy Specialist

## 2023-12-24 DIAGNOSIS — Z794 Long term (current) use of insulin: Secondary | ICD-10-CM

## 2023-12-24 DIAGNOSIS — E119 Type 2 diabetes mellitus without complications: Secondary | ICD-10-CM

## 2023-12-24 DIAGNOSIS — I10 Essential (primary) hypertension: Secondary | ICD-10-CM | POA: Diagnosis not present

## 2023-12-24 MED ORDER — BLOOD PRESSURE MONITORING KIT
1.0000 | PACK | Freq: Every day | 0 refills | Status: AC
  Filled 2023-12-24: qty 1, fill #0

## 2023-12-24 MED ORDER — SEMAGLUTIDE(0.25 OR 0.5MG/DOS) 2 MG/3ML ~~LOC~~ SOPN: PEN_INJECTOR | SUBCUTANEOUS | 1 refills | Status: AC

## 2023-12-24 NOTE — Assessment & Plan Note (Signed)
 Explained that she will need to watch her blood sugar and potentially decrease insulin doses as she increases strength of Ozempic.  Patient has not met goal of at least 5% of body weight loss with comprehensive lifestyle modifications alone in the past 3-6 months. Pharmacotherapy is appropriate to pursue as augmentation. Will start Ozempic.  (She was on previously, but d/c 2/2 shortages)  Advised patient on common side effects including nausea, diarrhea, dyspepsia, decreased appetite, and fatigue. Counseled patient on reducing meal size and how to titrate medication to minimize side effects. Patient aware to call if intolerable side effects or if experiencing dehydration, abdominal pain, or dizziness. Patient will adhere to dietary modifications and will target at least 150 minutes of moderate intensity exercise weekly.   Injection technique reviewed at today's visit.    Titration Plan:  Will plan to follow the titration plan as below, pending patient is tolerating each dose before increasing to the next. Can slow titration if needed for tolerability.    -Month 1: Inject 0.25 mg  SQ once weekly x 4 weeks -Month 2: Inject 0.5 mg SQ once weekly x 6 weeks  Follow up in 2 months.

## 2023-12-24 NOTE — Telephone Encounter (Signed)
 Patient would like a phone call to disucss being able to get a blood pressure cuff/monitor and the possibility of pre-op for a hernia.  Thank you.

## 2023-12-24 NOTE — Patient Instructions (Addendum)
 We will start Ozempic  TIPS FOR SUCCESS Write down the reasons why you want to lose weight and post it in a place where you'll see it often. Start small and work your way up. Keep in mind that it takes time to achieve goals, and small steps add up. Any additional movements help to burn calories. Taking the stairs rather than the elevator and parking at the far end of your parking lot are easy ways to start. Brisk walking for at least 30 minutes 4 or more days of the week is an excellent goal to work toward  [employer] WHAT IT MEANS TO FEEL FULL Did you know that it can take 15 minutes or more for your brain to receive the message that you've eaten? That means that, if you eat less food, but consume it slower, you may still feel satisfied. Eating a lot of fruits and vegetables can also help you feel fuller. Eat off of smaller plates so that moderate portions don't seem too small  TITRATION PLAN Will plan to follow the titration plan as below, pending patient is tolerating each dose before increasing to the next. Can slow titration if needed for tolerability.    -Weeks 1-4: Inject 0.25 mg SQ once weekly  (4 weeks) -Weeks 5-10: Inject 0.5 mg SQ once weekly  (6 weeks)  Follow up in 2 months - June 2 at 10:15 am.  If you have any questions or concerns, please reach out to [place].  Robi Mitter/Chris at [phone].  THANK YOU FOR CHOOSING [facility]

## 2023-12-24 NOTE — Progress Notes (Signed)
 Office Visit    Patient Name: Anita James Date of Encounter: 12/24/2023  Primary Care Provider:  Lowry Ram, PA-C Primary Cardiologist:  None  Chief Complaint    Weight management  Significant Past Medical History   DM2 3/25 A1c 6.7; Glargine 100 U/day, novolog 20-45 U tid pc  CHF Diastolic, LEE; on torsemide, carvedilol, empagliflozin  HTN Elevated at last visit, on carvedilol  OSA On CPAP  gout On allopurinol    Allergies  Allergen Reactions   Amoxicillin-Pot Clavulanate Nausea And Vomiting and Other (See Comments)    Combination of medications tear lining of stomach  Other Reaction(s): GI Intolerance, Not available   Oxycodone Shortness Of Breath    oxycontin too   Sulfa Antibiotics Shortness Of Breath   Amoxicillin     Other Reaction(s): Unknown   Norvasc [Amlodipine Besylate] Swelling    feet   Penicillins    Sulfamethoxazole Nausea Only and Other (See Comments)    Other Reaction(s): GI Intolerance   Tramadol Other (See Comments)    Unknown, toxicity. Patient advised she had to be given Narcan with this    History of Present Illness    Anita James is a 73 y.o. female patient of Dr Allyson Sabal, in the office today to discuss options for weight management.   She tells me that she was on Ozempic in the past, but it didn't work, in part due to problems obtaining medication on a steady basis.  Currently on insulin glargine and novolog daily, in addition to empagliflozin.    Current weight management medications: none  Previously tried meds:  Ozempic - had trouble with access to medication  Current meds that may affect weight: none  Baseline weight/BMI:  148.6 kg // 52.9  Insurance payor:  [facility] Medicare/Medicaid - $0 copay for DM medication (Ozempic)  Diet: eats out some; cereal and fruit (Cheerios) for breakfast; grilled chicken; spaghetti yesterday; occasional baked potato, green beans, salads (no dressing), pintos, broccoli; breakfast when gets up (as late  as noon), snack around 3-4 pm then dinner around 6-7;  Exercise: none, SOB easily, uses walker to get around  Social History:   Tobacco: no  Alcohol: no  Caffeine: sweet tea occasionally  Accessory Clinical Findings    Lab Results  Component Value Date   CREATININE 1.11 (H) 12/16/2023   BUN 24 (H) 12/16/2023   NA 140 12/16/2023   K 4.2 12/16/2023   CL 102 12/16/2023   CO2 24 12/16/2023   Lab Results  Component Value Date   ALT 21 12/16/2023   AST 28 12/16/2023   ALKPHOS 77 12/16/2023   BILITOT 1.0 12/16/2023   Lab Results  Component Value Date   HGBA1C 6.7 (H) 11/22/2023      Home Medications/Allergies    Current Outpatient Medications  Medication Sig Dispense Refill   Blood Pressure Monitoring KIT Use daily. 1 kit 0   Semaglutide,0.25 or 0.5MG /DOS, 2 MG/3ML SOPN Inject 0.25 mg into the skin every 7 (seven) days for 28 days, THEN 0.5 mg every 7 (seven) days. 3 mL 1   Accu-Chek FastClix Lancets MISC USE AS DIRECTED TO CHECK BLOOD SUGAR THREE TIMES DAILY 306 each 0   ACCU-CHEK GUIDE test strip USE AS DIRECTED TO CHECK BLOOD SUGAR THREE TIMES DAILY 100 strip 0   albuterol (ACCUNEB) 0.63 MG/3ML nebulizer solution Take 1 ampule by nebulization 3 (three) times daily as needed for wheezing or shortness of breath.     allopurinol (ZYLOPRIM) 100 MG  tablet Take 100 mg by mouth daily.     aspirin EC 81 MG tablet Take 81 mg by mouth daily.     atorvastatin (LIPITOR) 80 MG tablet Take 1 tablet (80 mg total) by mouth daily. 30 tablet 2   Blood Glucose Monitoring Suppl (ACCU-CHEK AVIVA PLUS) w/Device KIT USE AS DIRECTED TO TEST TWICE DAILY 1 kit 0   Budeson-Glycopyrrol-Formoterol (BREZTRI AEROSPHERE) 160-9-4.8 MCG/ACT AERO Inhale 2 puffs into the lungs in the morning and at bedtime.     carvedilol (COREG) 25 MG tablet Take 25 mg by mouth 2 (two) times daily.     cetirizine (ZYRTEC) 10 MG tablet Take 10 mg by mouth daily as needed for allergies.     diclofenac sodium (VOLTAREN) 1 %  GEL Apply 4 g topically 4 (four) times daily. (Patient taking differently: Apply 4 g topically daily as needed (pain).) 100 g 0   empagliflozin (JARDIANCE) 10 MG TABS tablet Take 10 mg by mouth daily.     feeding supplement, GLUCERNA SHAKE, (GLUCERNA SHAKE) LIQD Take 237 mLs by mouth every morning.     fluticasone (FLONASE) 50 MCG/ACT nasal spray PLACE 2 SPRAYS IN EACH NOSTRIL DAILY AS NEEDED FOR ALLERGIES 48 g 0   insulin aspart (NOVOLOG FLEXPEN) 100 UNIT/ML FlexPen Inject 60 Units into the skin 3 (three) times daily with meals. (Patient taking differently: Inject 20-45 Units into the skin 3 (three) times daily after meals.) 45 mL 2   Insulin Glargine (LANTUS SOLOSTAR) 100 UNIT/ML Solostar Pen Inject 84 Units into the skin daily. (Patient taking differently: Inject 100 Units into the skin daily.) 10 pen 11   Insulin Pen Needle (TRUEPLUS PEN NEEDLES) 32G X 4 MM MISC Use to inject insulin. 100 each 11   levothyroxine (SYNTHROID) 100 MCG tablet Take 1 tablet (100 mcg total) by mouth daily. 30 tablet 6   lidocaine (LIDODERM) 5 % Place 1 patch onto the skin every 12 (twelve) hours. Remove & Discard patch within 12 hours or as directed by MD 30 patch 2   NOVOFINE 32G X 6 MM MISC SMARTSIG:1 SUB-Q 4-5 Times Daily     NYSTATIN powder APPLY TOPICALLY 4 TIMES DAILY. (Patient taking differently: Apply 1 Bottle topically 4 (four) times daily as needed (rash).) 15 g 0   omeprazole (PRILOSEC) 20 MG capsule Take 20 mg by mouth daily.     pregabalin (LYRICA) 75 MG capsule Take 75 mg by mouth 2 (two) times daily.     torsemide (DEMADEX) 20 MG tablet Take 20 mg by mouth daily as needed (fluid).     Zinc Acetate 50 MG CAPS Take 1 capsule by mouth in the morning and at bedtime.     No current facility-administered medications for this visit.     Allergies  Allergen Reactions   Amoxicillin-Pot Clavulanate Nausea And Vomiting and Other (See Comments)    Combination of medications tear lining of stomach  Other  Reaction(s): GI Intolerance, Not available   Oxycodone Shortness Of Breath    oxycontin too   Sulfa Antibiotics Shortness Of Breath   Amoxicillin     Other Reaction(s): Unknown   Norvasc [Amlodipine Besylate] Swelling    feet   Penicillins    Sulfamethoxazole Nausea Only and Other (See Comments)    Other Reaction(s): GI Intolerance   Tramadol Other (See Comments)    Unknown, toxicity. Patient advised she had to be given Narcan with this    Assessment & Plan    Essential hypertension BP elevated in  the office today, currently on carvedilol.  She is asking for a home BP monitor.  Will send prescription to [facility] to see if this can be covered.  She will need a large arm cuff.    Insulin dependent type 2 diabetes mellitus (HCC) Explained that she will need to watch her blood sugar and potentially decrease insulin doses as she increases strength of Ozempic.  Patient has not met goal of at least 5% of body weight loss with comprehensive lifestyle modifications alone in the past 3-6 months. Pharmacotherapy is appropriate to pursue as augmentation. Will start Ozempic.  (She was on previously, but d/c 2/2 shortages)  Advised patient on common side effects including nausea, diarrhea, dyspepsia, decreased appetite, and fatigue. Counseled patient on reducing meal size and how to titrate medication to minimize side effects. Patient aware to call if intolerable side effects or if experiencing dehydration, abdominal pain, or dizziness. Patient will adhere to dietary modifications and will target at least 150 minutes of moderate intensity exercise weekly.   Injection technique reviewed at today's visit.    Titration Plan:  Will plan to follow the titration plan as below, pending patient is tolerating each dose before increasing to the next. Can slow titration if needed for tolerability.    -Month 1: Inject 0.25 mg  SQ once weekly x 4 weeks -Month 2: Inject 0.5 mg SQ once weekly x 6  weeks  Follow up in 2 months.      Phillips Hay PharmD CPP [facility]  [address] [place], [place] [address] [phone]

## 2023-12-24 NOTE — Assessment & Plan Note (Deleted)
 Explained that she will need to watch her blood sugar and potentially decrease insulin doses as she increases strength of Ozempic.  Patient has not met goal of at least 5% of body weight loss with comprehensive lifestyle modifications alone in the past 3-6 months. Pharmacotherapy is appropriate to pursue as augmentation. Will start Ozempic.  (She was on previously, but d/c 2/2 shortages)  Advised patient on common side effects including nausea, diarrhea, dyspepsia, decreased appetite, and fatigue. Counseled patient on reducing meal size and how to titrate medication to minimize side effects. Patient aware to call if intolerable side effects or if experiencing dehydration, abdominal pain, or dizziness. Patient will adhere to dietary modifications and will target at least 150 minutes of moderate intensity exercise weekly.   Injection technique reviewed at today's visit.    Titration Plan:  Will plan to follow the titration plan as below, pending patient is tolerating each dose before increasing to the next. Can slow titration if needed for tolerability.    -Month 1: Inject 0.25 mg  SQ once weekly x 4 weeks -Month 2: Inject 0.5 mg SQ once weekly x 6 weeks  Follow up in 2 months.

## 2023-12-24 NOTE — Assessment & Plan Note (Signed)
 BP elevated in the office today, currently on carvedilol.  She is asking for a home BP monitor.  Will send prescription to [facility] to see if this can be covered.  She will need a large arm cuff.

## 2023-12-24 NOTE — Telephone Encounter (Signed)
 Patient identification verified by 2 forms. Marilynn Rail, RN    Called and spoke to patient  Patient states:   -needs pre-op clearance for hernia repair   -surgeon would like cardiology clearance  Advised patient:   -have surgeon office sent Pre-op clearance to [phone] Attn Pre-op  Patient had no further questions or concerns at this time

## 2023-12-27 ENCOUNTER — Ambulatory Visit: Admitting: Neurology

## 2023-12-27 ENCOUNTER — Telehealth: Payer: Self-pay | Admitting: Neurology

## 2023-12-27 NOTE — Telephone Encounter (Signed)
 Pt cld bck for results

## 2023-12-28 NOTE — Telephone Encounter (Signed)
 See result note.

## 2023-12-30 ENCOUNTER — Other Ambulatory Visit: Payer: Self-pay

## 2023-12-30 DIAGNOSIS — E1142 Type 2 diabetes mellitus with diabetic polyneuropathy: Secondary | ICD-10-CM

## 2023-12-30 DIAGNOSIS — R202 Paresthesia of skin: Secondary | ICD-10-CM

## 2023-12-30 DIAGNOSIS — M5412 Radiculopathy, cervical region: Secondary | ICD-10-CM

## 2023-12-30 NOTE — Progress Notes (Signed)
 Marland Kitchen

## 2024-01-20 ENCOUNTER — Encounter: Admitting: Neurology

## 2024-01-27 ENCOUNTER — Ambulatory Visit: Admitting: Neurology

## 2024-01-27 DIAGNOSIS — R202 Paresthesia of skin: Secondary | ICD-10-CM

## 2024-01-27 DIAGNOSIS — E1142 Type 2 diabetes mellitus with diabetic polyneuropathy: Secondary | ICD-10-CM

## 2024-01-27 DIAGNOSIS — M5412 Radiculopathy, cervical region: Secondary | ICD-10-CM

## 2024-01-27 NOTE — Procedures (Signed)
  [facility]  [address] [place], [address]  [place], [place] [address] Tel: [phone] Fax: [phone] Test Date:  01/27/2024  Patient: Anita James DOB: [birth year] Physician: Reyna Cava, DO  Sex: Female Height: 5\' 6"  Ref Phys: Reyna Cava, DO  ID#: [id number]   Technician:    History: This is a 73 year old female with diabetes mellitus referred for evaluation of right arm pain.  NCV & EMG Findings: Extensive electrodiagnostic testing of the right upper extremity shows:  Right median and ulnar sensory responses are absent.  Right radial sensory response shows prolonged latency (2.9 ms) and reduced amplitude (7.0 V).   Right median motor response shows prolonged latency (7.0 ms), reduced amplitude (4.2 mV), and decreased conduction velocity (Elbow-Wrist, 47 m/s).  Right ulnar motor response shows prolonged latency (3.2 ms), reduced amplitude (5.7 mV), decreased conduction velocity (B Elbow-Wrist, 33 m/s), and decreased conduction velocity (A Elbow-B Elbow, 43 m/s).   Chronic motor axonal loss changes are seen affecting the right first dorsal interosseous, abductor pollicis brevis, pronator teres, and biceps muscles.  There is no evidence of accompanying active denervation.    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   ___________________________ Reyna Cava, DO    Nerve Conduction Studies   Stim Site NR Peak (ms) Norm Peak (ms) O-P Amp (V) Norm O-P Amp  Right Median Anti Sensory (2nd Digit)  32 C  Wrist *NR  <3.8  >10  Right Radial Anti Sensory (Base 1st Digit)  32 C  Wrist    *2.9 <2.8 *7.0 >10  Right Ulnar Anti Sensory (5th Digit)  32 C  Wrist *NR  <3.2  >5     Stim Site NR Onset (ms) Norm Onset (ms) O-P Amp (mV) Norm O-P Amp Site1 Site2 Delta-0 (ms) Dist (cm) Vel (m/s) Norm Vel (m/s)  Right Median Motor (Abd Poll Brev)  32 C   Wrist    *7.0 <4.0 *4.2 >5 Elbow Wrist 6.6 31.0 *47 >50  Elbow    13.6  3.8         Right Ulnar Motor (Abd Dig Minimi)  32 C  Wrist    *3.2 <3.1 *5.7 >7 B Elbow Wrist 6.4 21.0 *33 >50  B Elbow    9.6  4.5  A Elbow B Elbow 2.3 10.0 *43 >50  A Elbow    11.9  4.5          Electromyography   Side Muscle Ins.Act Fibs Fasc Recrt Amp Dur Poly Activation Comment  Right 1stDorInt Nml Nml Nml *1- *1+ *1+ *1+ Nml N/A  Right PronatorTeres Nml Nml Nml *1- *1+ *1+ *1+ Nml N/A  Right Abd Poll Brev Nml Nml Nml *1- *1+ *1+ *1+ Nml N/A  Right Biceps Nml Nml Nml *1- *1+ *1+ *1+ Nml N/A  Right Triceps Nml Nml Nml Nml Nml Nml Nml Nml N/A  Right Deltoid Nml Nml Nml Nml Nml Nml Nml Nml N/A      Waveforms:

## 2024-02-01 ENCOUNTER — Ambulatory Visit: Payer: Self-pay

## 2024-02-01 ENCOUNTER — Telehealth: Payer: Self-pay | Admitting: Neurology

## 2024-02-01 NOTE — Telephone Encounter (Signed)
 Pt called in returning Anita James's call

## 2024-02-02 NOTE — Telephone Encounter (Signed)
 Pt called back again wanting her results

## 2024-02-03 NOTE — Telephone Encounter (Signed)
 See result note.

## 2024-02-21 ENCOUNTER — Ambulatory Visit
Attending: Pharmacist Clinician (PhC)/ Clinical Pharmacy Specialist | Admitting: Pharmacist Clinician (PhC)/ Clinical Pharmacy Specialist

## 2024-02-21 ENCOUNTER — Telehealth: Admitting: *Deleted

## 2024-02-21 NOTE — Progress Notes (Deleted)
 Office Visit    Patient Name: Anita James Date of Encounter: 02/21/2024  Primary Care Provider:  Tiana Flurry, PA-C Primary Cardiologist:  None  Chief Complaint    Weight management  Significant Past Medical History   DM2 3/25 A1c 6.7; Glargine 100 U/day, novolog  20-45 U tid pc  CHF Diastolic, LEE; on torsemide , carvedilol , empagliflozin  HTN Elevated at last visit, on carvedilol   OSA On CPAP  gout On allopurinol     Allergies  Allergen Reactions   Amoxicillin-Pot Clavulanate Nausea And Vomiting and Other (See Comments)    Combination of medications tear lining of stomach  Other Reaction(s): GI Intolerance, Not available   Oxycodone  Shortness Of Breath    oxycontin  too   Sulfa Antibiotics Shortness Of Breath   Amoxicillin     Other Reaction(s): Unknown   Norvasc  [Amlodipine  Besylate] Swelling    feet   Penicillins    Sulfamethoxazole Nausea Only and Other (See Comments)    Other Reaction(s): GI Intolerance   Tramadol  Other (See Comments)    Unknown, toxicity. Patient advised she had to be given Narcan with this    History of Present Illness    Anita James is a 73 y.o. female patient of Dr Katheryne Pane, in the office today to discuss options for weight management.   She tells me that she was on Ozempic  in the past, but it didn't work, in part due to problems obtaining medication on a steady basis.  Currently on insulin  glargine and novolog  daily, in addition to empagliflozin.    Today she is in the office for follow up.  Decrease insulin  needs?  Current weight management medications: none  Previously tried meds:  Ozempic  - had trouble with access to medication  Current meds that may affect weight: none  Baseline weight/BMI:  148.6 kg // 52.9 Today's weight/BMI:     Insurance payor:  [facility] Medicare/Medicaid - $0 copay for DM medication (Ozempic )  Diet: eats out some; cereal and fruit (Cheerios) for breakfast; grilled chicken; spaghetti yesterday; occasional  baked potato, green beans, salads (no dressing), pintos, broccoli; breakfast when gets up (as late as noon), snack around 3-4 pm then dinner around 6-7;  Exercise: none, SOB easily, uses walker to get around  Social History:   Tobacco: no  Alcohol: no  Caffeine : sweet tea occasionally  Accessory Clinical Findings    Lab Results  Component Value Date   CREATININE 1.11 (H) 12/16/2023   BUN 24 (H) 12/16/2023   NA 140 12/16/2023   K 4.2 12/16/2023   CL 102 12/16/2023   CO2 24 12/16/2023   Lab Results  Component Value Date   ALT 21 12/16/2023   AST 28 12/16/2023   ALKPHOS 77 12/16/2023   BILITOT 1.0 12/16/2023   Lab Results  Component Value Date   HGBA1C 6.7 (H) 11/22/2023      Home Medications/Allergies    Current Outpatient Medications  Medication Sig Dispense Refill   Accu-Chek FastClix Lancets MISC USE AS DIRECTED TO CHECK BLOOD SUGAR THREE TIMES DAILY 306 each 0   ACCU-CHEK GUIDE test strip USE AS DIRECTED TO CHECK BLOOD SUGAR THREE TIMES DAILY 100 strip 0   albuterol (ACCUNEB) 0.63 MG/3ML nebulizer solution Take 1 ampule by nebulization 3 (three) times daily as needed for wheezing or shortness of breath.     allopurinol  (ZYLOPRIM ) 100 MG tablet Take 100 mg by mouth daily.     aspirin  EC 81 MG tablet Take 81 mg by mouth daily.  atorvastatin  (LIPITOR ) 80 MG tablet Take 1 tablet (80 mg total) by mouth daily. 30 tablet 2   Blood Glucose Monitoring Suppl (ACCU-CHEK AVIVA PLUS) w/Device KIT USE AS DIRECTED TO TEST TWICE DAILY 1 kit 0   Blood Pressure Monitoring KIT Use daily. 1 kit 0   Budeson-Glycopyrrol-Formoterol  (BREZTRI AEROSPHERE) 160-9-4.8 MCG/ACT AERO Inhale 2 puffs into the lungs in the morning and at bedtime.     carvedilol  (COREG ) 25 MG tablet Take 25 mg by mouth 2 (two) times daily.     cetirizine  (ZYRTEC ) 10 MG tablet Take 10 mg by mouth daily as needed for allergies.     diclofenac  sodium (VOLTAREN ) 1 % GEL Apply 4 g topically 4 (four) times daily.  (Patient taking differently: Apply 4 g topically daily as needed (pain).) 100 g 0   empagliflozin (JARDIANCE) 10 MG TABS tablet Take 10 mg by mouth daily.     feeding supplement, GLUCERNA SHAKE, (GLUCERNA SHAKE) LIQD Take 237 mLs by mouth every morning.     fluticasone  (FLONASE ) 50 MCG/ACT nasal spray PLACE 2 SPRAYS IN EACH NOSTRIL DAILY AS NEEDED FOR ALLERGIES 48 g 0   insulin  aspart (NOVOLOG  FLEXPEN) 100 UNIT/ML FlexPen Inject 60 Units into the skin 3 (three) times daily with meals. (Patient taking differently: Inject 20-45 Units into the skin 3 (three) times daily after meals.) 45 mL 2   Insulin  Glargine (LANTUS  SOLOSTAR) 100 UNIT/ML Solostar Pen Inject 84 Units into the skin daily. (Patient taking differently: Inject 100 Units into the skin daily.) 10 pen 11   Insulin  Pen Needle (TRUEPLUS PEN NEEDLES) 32G X 4 MM MISC Use to inject insulin . 100 each 11   levothyroxine  (SYNTHROID ) 100 MCG tablet Take 1 tablet (100 mcg total) by mouth daily. 30 tablet 6   lidocaine  (LIDODERM ) 5 % Place 1 patch onto the skin every 12 (twelve) hours. Remove & Discard patch within 12 hours or as directed by MD 30 patch 2   NOVOFINE 32G X 6 MM MISC SMARTSIG:1 SUB-Q 4-5 Times Daily     NYSTATIN  powder APPLY TOPICALLY 4 TIMES DAILY. (Patient taking differently: Apply 1 Bottle topically 4 (four) times daily as needed (rash).) 15 g 0   omeprazole (PRILOSEC) 20 MG capsule Take 20 mg by mouth daily.     pregabalin  (LYRICA ) 75 MG capsule Take 75 mg by mouth 2 (two) times daily.     Semaglutide ,0.25 or 0.5MG /DOS, 2 MG/3ML SOPN Inject 0.25 mg into the skin every 7 (seven) days for 28 days, THEN 0.5 mg every 7 (seven) days. 3 mL 1   torsemide  (DEMADEX ) 20 MG tablet Take 20 mg by mouth daily as needed (fluid).     Zinc Acetate 50 MG CAPS Take 1 capsule by mouth in the morning and at bedtime.     No current facility-administered medications for this visit.     Allergies  Allergen Reactions   Amoxicillin-Pot Clavulanate Nausea  And Vomiting and Other (See Comments)    Combination of medications tear lining of stomach  Other Reaction(s): GI Intolerance, Not available   Oxycodone  Shortness Of Breath    oxycontin  too   Sulfa Antibiotics Shortness Of Breath   Amoxicillin     Other Reaction(s): Unknown   Norvasc  [Amlodipine  Besylate] Swelling    feet   Penicillins    Sulfamethoxazole Nausea Only and Other (See Comments)    Other Reaction(s): GI Intolerance   Tramadol  Other (See Comments)    Unknown, toxicity. Patient advised she had to be given Narcan  with this    Assessment & Plan    No problem-specific Assessment & Plan notes found for this encounter.    Bertil Brickey PharmD CPP [facility]  [address] [place], [place] [address] [phone]

## 2024-02-21 NOTE — Telephone Encounter (Signed)
   Left message for patient to call back to advise of below.    Patient's plan covers Durolane and CPT 20610 at 80%. $66 Deductible is met. If OOP is met, coverage goes to 100%.  No PA or precert required.   Patient's responsibility per Durolane inj = 20% (approx $420)

## 2024-03-01 NOTE — Telephone Encounter (Signed)
 Patient called back- states she never rec'd a call from us .  Pt informed of below.  Patient was very quick, rude and hung up on me. States she can't afford this.

## 2024-03-07 ENCOUNTER — Encounter: Payer: Self-pay | Admitting: Neurology

## 2024-03-07 ENCOUNTER — Ambulatory Visit: Admitting: Neurology

## 2024-03-08 ENCOUNTER — Ambulatory Visit (HOSPITAL_BASED_OUTPATIENT_CLINIC_OR_DEPARTMENT_OTHER): Attending: Neurology | Admitting: Physical Therapy

## 2024-03-08 ENCOUNTER — Other Ambulatory Visit: Payer: Self-pay

## 2024-03-08 ENCOUNTER — Encounter (HOSPITAL_BASED_OUTPATIENT_CLINIC_OR_DEPARTMENT_OTHER): Payer: Self-pay | Admitting: Physical Therapy

## 2024-03-08 DIAGNOSIS — M542 Cervicalgia: Secondary | ICD-10-CM | POA: Diagnosis present

## 2024-03-08 DIAGNOSIS — R29898 Other symptoms and signs involving the musculoskeletal system: Secondary | ICD-10-CM | POA: Diagnosis present

## 2024-03-08 DIAGNOSIS — M5412 Radiculopathy, cervical region: Secondary | ICD-10-CM | POA: Diagnosis not present

## 2024-03-08 DIAGNOSIS — R293 Abnormal posture: Secondary | ICD-10-CM | POA: Diagnosis present

## 2024-03-08 DIAGNOSIS — M6281 Muscle weakness (generalized): Secondary | ICD-10-CM | POA: Insufficient documentation

## 2024-03-08 NOTE — Therapy (Addendum)
 OUTPATIENT PHYSICAL THERAPY CERVICAL EVALUATION   Patient Name: Anita James MRN: [id number] DOB:Mar 29, 1951, 73 y.o., female Today's Date: 03/08/2024  END OF SESSION:  PT End of Session - 03/08/24 1309     Visit Number 1    Number of Visits 12    Date for PT Re-Evaluation 04/19/24    Authorization Type [employer]ICARE    Progress Note Due on Visit 10    PT Start Time 1310    PT Stop Time 1400    PT Time Calculation (min) 50 min    Activity Tolerance Patient tolerated treatment well    Behavior During Therapy [facility] for tasks assessed/performed          Past Medical History:  Diagnosis Date   Abdominal pain    Anxiety    Carpal tunnel syndrome 06/14/2014   Chest pain    CHF (congestive heart failure) (HCC)    Chronic back pain    Chronic kidney disease 03/2013   failure due to meds   Diabetes mellitus type II, uncontrolled    Diverticulitis    Frequent headaches    Hiatal hernia    Hyperlipemia    Hypertension    Hypothyroidism    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   Neuropathy    Obesity    Pneumonia    Vitamin D  deficiency 06/05/2014   Past Surgical History:  Procedure Laterality Date   CESAREAN SECTION     COLONOSCOPY WITH PROPOFOL  N/A 07/01/2021   Procedure: COLONOSCOPY WITH PROPOFOL ;  Surgeon: Genell Ken, MD;  Location: [facility];  Service: Gastroenterology;  Laterality: N/A;   KNEE SURGERY Left    THYROID SURGERY     goiter   Patient Active Problem List   Diagnosis Date Noted   Numbness and tingling of left upper extremity 11/22/2023   Peripheral neuropathy 11/22/2023   Insulin  dependent type 2 diabetes mellitus (HCC) 11/22/2023   Urinary incontinence 11/22/2023   Elevated lactic acid level 11/22/2023   Chronic gout 11/22/2023   Reactive airway disease 11/22/2023   Osteopenia of neck of left femur 05/09/2020   Chondrocalcinosis 04/29/2020   OA (osteoarthritis) of knee 04/15/2020   Right hand pain 04/15/2020   OSA on CPAP 05/19/2017    Morbid obesity due to excess calories (HCC) 03/17/2017   Neuropathic pain 11/04/2016   Carpal tunnel syndrome 06/14/2014   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   Cervical radiculopathy at C8 06/05/2014   Hyperlipemia 09/12/2013   Chronic diastolic CHF (congestive heart failure) (HCC) 09/12/2013   Major depression 09/12/2013   Allergic rhinitis 06/22/2013   Chronic pain syndrome 05/31/2013   Gastroesophageal reflux disease without esophagitis 08/30/2012   Essential hypertension    Hypothyroidism    Type 2 diabetes, uncontrolled, with neuropathy     PCP: Tiana Flurry, PA-C  REFERRING PROVIDER: Daryel Ensign, DO  REFERRING DIAG: [phone] (ICD-10-CM) - Cervical radiculopathy  THERAPY DIAG:  Cervicalgia  Muscle weakness (generalized)  Other symptoms and signs involving the musculoskeletal system  Abnormal posture  Rationale for Evaluation and Treatment: Rehabilitation  ONSET DATE: Feb/March 2025  SUBJECTIVE:  SUBJECTIVE STATEMENT: Patient states pinched nerve in neck. Patient states numb feeling in L UE and middle finger. Some numbness in L flank as well. Injections for suspected shingles in R flank/breast area but shingles never broke the skin. Can use LUE but can't feel it. Symptoms began while watching TV.   PERTINENT HISTORY:  Hx of hypertension, hypothyroidism, OSA on CPAP, morbid obesity, diastolic heart failure, peripheral neuropathy, and insulin -dependent DM type II  PAIN:  Are you having pain? Yes: NPRS scale: 8/10 Pain location: LUE Pain description: numbness Aggravating factors: constant Relieving factors: none  PRECAUTIONS: None  WEIGHT BEARING RESTRICTIONS: No  FALLS:  Has patient fallen in last 6 months? No  PLOF: Independent  PATIENT GOALS: to get  well  OBJECTIVE: (objective measures from initial evaluation unless otherwise dated)  DIAGNOSTIC FINDINGS:  MRI cervical spine 11/22/2023: 1. Motion degraded exam. 2. Multilevel cervical spondylosis with resultant mild spinal stenosis at C5-6 and C6-7. 3. Multifactorial degenerative changes with resultant multilevel foraminal narrowing as above. Notable findings include mild left C4 and C5 foraminal stenosis, mild to moderate bilateral C6 foraminal narrowing, with moderate left and mild right C7 foraminal stenosis.  PATIENT SURVEYS:  NDI:  NECK DISABILITY INDEX  Date: 03/08/24 Score  Pain intensity 2 = The pain is moderate at the moment  2. Personal care (washing, dressing, etc.) 2 = It is painful to look after myself and I am slow and careful  3. Lifting 4 =  I can only lift very light weights  4. Reading 4 =  I can hardly read at all because of severe pain in my neck  5. Headaches 3 = I have moderate headaches, which come frequently  6. Concentration 0 =  I can concentrate fully when I want to with no difficulty  7. Work 3 =  I cannot do my usual work  8. Driving 1 =  I can drive my car as long as I want with slight pain in my neck  9. Sleeping 1 = My sleep is slightly disturbed (less than 1 hr sleepless)  10. Recreation 5 = I can't do any recreation activities at all  Total 25/50   Minimum Detectable Change (90% confidence): 5 points or 10% points  COGNITION: Overall cognitive status: Within functional limits for tasks assessed  SENSATION: Impaired L C7  POSTURE: rounded shoulders, forward head, and increased thoracic kyphosis  PALPATION: Grossly TTP cervical paraspinals, UT, periscap musculature   CERVICAL ROM:   Active ROM A/PROM (deg) eval  Flexion 35*  Extension 20*  Right lateral flexion 20*  Left lateral flexion 27*  Right rotation 55*  Left rotation 37*   (Blank rows = not tested) *=pain/symptoms  UPPER EXTREMITY ROM: limited bilateral UE mobility overhead  with R more limited than L   Active ROM Right eval Left eval  Shoulder flexion    Shoulder extension    Shoulder abduction    Shoulder adduction    Shoulder extension    Shoulder internal rotation    Shoulder external rotation    Elbow flexion    Elbow extension    Wrist flexion    Wrist extension    Wrist ulnar deviation    Wrist radial deviation    Wrist pronation    Wrist supination     (Blank rows = not tested) *=pain/symptoms  UPPER EXTREMITY MMT:  MMT Right eval Left eval  Shoulder flexion 4+ 4+  Shoulder extension    Shoulder abduction 4+ 4+  Shoulder  adduction    Shoulder extension    Shoulder internal rotation 5 5  Shoulder external rotation 4+ 4+  Middle trapezius    Lower trapezius    Elbow flexion 5 5  Elbow extension 5 4+  Wrist flexion 4+ 4  Wrist extension 5 4  Wrist ulnar deviation    Wrist radial deviation    Wrist pronation    Wrist supination    Grip strength 22 lbs 18 lbs   (Blank rows = not tested) *=pain/symptoms  CERVICAL SPECIAL TESTS:  Distraction test: Negative; no change in symptoms    TODAY'S TREATMENT:                                                                                                                              DATE:  03/08/24  Seated cervical retractions 1 x 10 Seated scap retractions 1x 10  PATIENT EDUCATION:  Education details: Patient educated on exam findings, POC, scope of PT, HEP, relevant anatomy and biomechanics, and posture. Person educated: Patient Education method: Explanation, Demonstration, and Handouts Education comprehension: verbalized understanding, returned demonstration, verbal cues required, and tactile cues required  HOME EXERCISE PROGRAM: Access Code: [email or web address] URL: [email or web address] Date: 03/08/2024 Prepared by: Debria Fang Shukri Nistler  Exercises - Seated Cervical Retraction  - 3 x daily - 7 x weekly - 2 sets - 10 reps - Seated Scapular Retraction  - 3 x daily - 7 x  weekly - 2 sets - 10 reps  ASSESSMENT:  CLINICAL IMPRESSION: Patient a 73 y.o. y.o. female who was seen today for physical therapy evaluation and treatment for L cervical radiculopathy. Patient presents with pain limited deficits in cervical spine and LUE strength, ROM, endurance, activity tolerance, posture, and functional mobility with ADL. Patient is having to modify and restrict ADL as indicated by outcome measure score as well as subjective information and objective measures which is affecting overall participation. Patient will benefit from skilled physical therapy in order to improve function and reduce impairment.  OBJECTIVE IMPAIRMENTS: decreased activity tolerance, decreased endurance, decreased mobility, decreased ROM, decreased strength, hypomobility, increased muscle spasms, impaired flexibility, impaired sensation, improper body mechanics, postural dysfunction, and pain.   ACTIVITY LIMITATIONS: carrying, lifting, bending, reach over head, hygiene/grooming, and caring for others  PARTICIPATION LIMITATIONS: meal prep, cleaning, laundry, shopping, community activity, and yard work  PERSONAL FACTORS: Hx of hypertension, hypothyroidism, OSA on CPAP, morbid obesity, diastolic heart failure, peripheral neuropathy, and insulin -dependent DM type II are also affecting patient's functional outcome.   REHAB POTENTIAL: Fair    CLINICAL DECISION MAKING: Unstable/unpredictable  EVALUATION COMPLEXITY: High   GOALS: Goals reviewed with patient? Yes  SHORT TERM GOALS: Target date: 04/05/2024    Patient will be independent with HEP in order to improve functional outcomes. Baseline: Goal status: INITIAL  2.  Patient will report at least 25% improvement in symptoms for improved quality of life. Baseline:  Goal status: INITIAL   LONG TERM GOALS: Target date: 04/19/2024  Patient will report at least 75% improvement in symptoms for improved quality of life. Baseline:  Goal status:  INITIAL  2.  Patient will improve NDI score by at least 10 points in order to indicate improved tolerance to activity. Baseline: 25/50 Goal status: INITIAL  3.  Patient will demonstrate at least 10 degree improvement in cervical ROM in all restricted planes for improved ability to move head with housework. Baseline:  Goal status: INITIAL  4.  Patient will report centralized cervical symptoms < 2 /10 for improved ability to use LUE.  Baseline:  Goal status: INITIAL      PLAN:  PT FREQUENCY: 1-2x/week  PT DURATION: 6 weeks  PLANNED INTERVENTIONS: 97164- PT Re-evaluation, 97110-Therapeutic exercises, 97530- Therapeutic activity, [id number]- Neuromuscular re-education, 97535- Self Care, [id number]- Manual therapy, [id number]- Gait training, [phone]- Orthotic Fit/training, [phone]- Canalith repositioning, [id number]- Aquatic Therapy, [phone]- Splinting, [phone]- Wound care (first 20 sq cm), 97598- Wound care (each additional 20 sq cm)Patient/Family education, Balance training, Stair training, Taping, Dry Needling, Joint mobilization, Joint manipulation, Spinal manipulation, Spinal mobilization, Scar mobilization, and DME instructions.  PLAN FOR NEXT SESSION: f/u with HEP, postural and UE strength, possibly manual for pain/mobility   Beather Liming, PT, DPT 03/08/2024, 2:02 PM   Date of referral: 12/14/23 Referring provider: Patel, Donika K, DO Referring diagnosis? M54.12 (ICD-10-CM) - Cervical radiculopathy Treatment diagnosis? (if different than referring diagnosis) Cervicalgia M54.2   What was this (referring dx) caused by? Ongoing Issue  Lonne Roan of Condition: Chronic (continuous duration > 3 months)   Laterality: Both  Current Functional Measure Score: Neck Index 25/50  Objective measurements identify impairments when they are compared to normal values, the uninvolved extremity, and prior level of function.  [x]  Yes  []  No  Objective assessment of functional ability: Moderate functional  limitations   Briefly describe symptoms: L arm and hand numbness  How did symptoms start: while watching TV  Average pain intensity:  Last 24 hours: 8/10  Past week: 8/10  How often does the pt experience symptoms? Constantly  How much have the symptoms interfered with usual daily activities? Moderately  How has condition changed since care began at this facility? NA - initial visit  In general, how is the patients overall health? Fair   BACK PAIN (STarT Back Screening Tool) No

## 2024-03-11 ENCOUNTER — Other Ambulatory Visit: Payer: Self-pay | Admitting: Cardiovascular Disease

## 2024-03-17 NOTE — Telephone Encounter (Signed)
 Med fell off med list due to being ordered with # of doses Need to see if pt wants to increase to 1mg  LVM for pt to call back

## 2024-03-27 ENCOUNTER — Ambulatory Visit (HOSPITAL_BASED_OUTPATIENT_CLINIC_OR_DEPARTMENT_OTHER): Attending: Neurology | Admitting: Physical Therapy

## 2024-03-27 DIAGNOSIS — M542 Cervicalgia: Secondary | ICD-10-CM | POA: Insufficient documentation

## 2024-03-27 DIAGNOSIS — R29898 Other symptoms and signs involving the musculoskeletal system: Secondary | ICD-10-CM | POA: Insufficient documentation

## 2024-03-27 DIAGNOSIS — M6281 Muscle weakness (generalized): Secondary | ICD-10-CM | POA: Insufficient documentation

## 2024-03-27 DIAGNOSIS — R293 Abnormal posture: Secondary | ICD-10-CM | POA: Insufficient documentation

## 2024-03-31 ENCOUNTER — Encounter (HOSPITAL_BASED_OUTPATIENT_CLINIC_OR_DEPARTMENT_OTHER): Payer: Self-pay | Admitting: Physical Therapy

## 2024-03-31 ENCOUNTER — Encounter (HOSPITAL_BASED_OUTPATIENT_CLINIC_OR_DEPARTMENT_OTHER): Admitting: Physical Therapy

## 2024-03-31 ENCOUNTER — Ambulatory Visit (HOSPITAL_BASED_OUTPATIENT_CLINIC_OR_DEPARTMENT_OTHER): Admitting: Physical Therapy

## 2024-03-31 DIAGNOSIS — R29898 Other symptoms and signs involving the musculoskeletal system: Secondary | ICD-10-CM

## 2024-03-31 DIAGNOSIS — M542 Cervicalgia: Secondary | ICD-10-CM | POA: Diagnosis present

## 2024-03-31 DIAGNOSIS — R293 Abnormal posture: Secondary | ICD-10-CM | POA: Diagnosis present

## 2024-03-31 DIAGNOSIS — M6281 Muscle weakness (generalized): Secondary | ICD-10-CM

## 2024-03-31 NOTE — Therapy (Signed)
 OUTPATIENT PHYSICAL THERAPY TREATMENT   Patient Name: Anita James MRN: [id number] DOB:1951/03/02, 73 y.o., female Today's Date: 03/31/2024  END OF SESSION:  PT End of Session - 03/31/24 1156     Visit Number 2    Number of Visits 12    Date for PT Re-Evaluation 04/19/24    Authorization Type [employer]ICARE    Progress Note Due on Visit 10    PT Start Time 1156    PT Stop Time 1228    PT Time Calculation (min) 32 min    Activity Tolerance Patient tolerated treatment well    Behavior During Therapy [facility] for tasks assessed/performed          Past Medical History:  Diagnosis Date   Abdominal pain    Anxiety    Carpal tunnel syndrome 06/14/2014   Chest pain    CHF (congestive heart failure) (HCC)    Chronic back pain    Chronic kidney disease 03/2013   failure due to meds   Diabetes mellitus type II, uncontrolled    Diverticulitis    Frequent headaches    Hiatal hernia    Hyperlipemia    Hypertension    Hypothyroidism    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   Neuropathy    Obesity    Pneumonia    Vitamin D  deficiency 06/05/2014   Past Surgical History:  Procedure Laterality Date   CESAREAN SECTION     COLONOSCOPY WITH PROPOFOL  N/A 07/01/2021   Procedure: COLONOSCOPY WITH PROPOFOL ;  Surgeon: Saintclair Jasper, MD;  Location: [facility];  Service: Gastroenterology;  Laterality: N/A;   KNEE SURGERY Left    THYROID SURGERY     goiter   Patient Active Problem List   Diagnosis Date Noted   Numbness and tingling of left upper extremity 11/22/2023   Peripheral neuropathy 11/22/2023   Insulin  dependent type 2 diabetes mellitus (HCC) 11/22/2023   Urinary incontinence 11/22/2023   Elevated lactic acid level 11/22/2023   Chronic gout 11/22/2023   Reactive airway disease 11/22/2023   Osteopenia of neck of left femur 05/09/2020   Chondrocalcinosis 04/29/2020   OA (osteoarthritis) of knee 04/15/2020   Right hand pain 04/15/2020   OSA on CPAP 05/19/2017   Morbid  obesity due to excess calories (HCC) 03/17/2017   Neuropathic pain 11/04/2016   Carpal tunnel syndrome 06/14/2014   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   Cervical radiculopathy at C8 06/05/2014   Hyperlipemia 09/12/2013   Chronic diastolic CHF (congestive heart failure) (HCC) 09/12/2013   Major depression 09/12/2013   Allergic rhinitis 06/22/2013   Chronic pain syndrome 05/31/2013   Gastroesophageal reflux disease without esophagitis 08/30/2012   Essential hypertension    Hypothyroidism    Type 2 diabetes, uncontrolled, with neuropathy     PCP: Carlie Rogelia LABOR, PA-C  REFERRING PROVIDER: Tobie Tonita POUR, DO  REFERRING DIAG: [phone] (ICD-10-CM) - Cervical radiculopathy  THERAPY DIAG:  Cervicalgia  Muscle weakness (generalized)  Other symptoms and signs involving the musculoskeletal system  Abnormal posture  Rationale for Evaluation and Treatment: Rehabilitation  ONSET DATE: Feb/March 2025  SUBJECTIVE:  SUBJECTIVE STATEMENT: Patient states about the same, doing HEP.   EVAL: Patient states pinched nerve in neck. Patient states numb feeling in L UE and middle finger. Some numbness in L flank as well. Injections for suspected shingles in R flank/breast area but shingles never broke the skin. Can use LUE but can't feel it. Symptoms began while watching TV.   PERTINENT HISTORY:  Hx of hypertension, hypothyroidism, OSA on CPAP, morbid obesity, diastolic heart failure, peripheral neuropathy, and insulin -dependent DM type II  PAIN:  Are you having pain? Yes: NPRS scale: 8/10 Pain location: LUE Pain description: numbness Aggravating factors: constant Relieving factors: none  PRECAUTIONS: None  WEIGHT BEARING RESTRICTIONS: No  FALLS:  Has patient fallen in last 6 months?  No  PLOF: Independent  PATIENT GOALS: to get well  OBJECTIVE: (objective measures from initial evaluation unless otherwise dated)  DIAGNOSTIC FINDINGS:  MRI cervical spine 11/22/2023: 1. Motion degraded exam. 2. Multilevel cervical spondylosis with resultant mild spinal stenosis at C5-6 and C6-7. 3. Multifactorial degenerative changes with resultant multilevel foraminal narrowing as above. Notable findings include mild left C4 and C5 foraminal stenosis, mild to moderate bilateral C6 foraminal narrowing, with moderate left and mild right C7 foraminal stenosis.  PATIENT SURVEYS:  NDI:  NECK DISABILITY INDEX  Date: 03/08/24 Score  Pain intensity 2 = The pain is moderate at the moment  2. Personal care (washing, dressing, etc.) 2 = It is painful to look after myself and I am slow and careful  3. Lifting 4 =  I can only lift very light weights  4. Reading 4 =  I can hardly read at all because of severe pain in my neck  5. Headaches 3 = I have moderate headaches, which come frequently  6. Concentration 0 =  I can concentrate fully when I want to with no difficulty  7. Work 3 =  I cannot do my usual work  8. Driving 1 =  I can drive my car as long as I want with slight pain in my neck  9. Sleeping 1 = My sleep is slightly disturbed (less than 1 hr sleepless)  10. Recreation 5 = I can't do any recreation activities at all  Total 25/50   Minimum Detectable Change (90% confidence): 5 points or 10% points  COGNITION: Overall cognitive status: Within functional limits for tasks assessed  SENSATION: Impaired L C7  POSTURE: rounded shoulders, forward head, and increased thoracic kyphosis  PALPATION: Grossly TTP cervical paraspinals, UT, periscap musculature   CERVICAL ROM:   Active ROM A/PROM (deg) eval  Flexion 35*  Extension 20*  Right lateral flexion 20*  Left lateral flexion 27*  Right rotation 55*  Left rotation 37*   (Blank rows = not tested) *=pain/symptoms  UPPER  EXTREMITY ROM: limited bilateral UE mobility overhead with R more limited than L   Active ROM Right eval Left eval  Shoulder flexion    Shoulder extension    Shoulder abduction    Shoulder adduction    Shoulder extension    Shoulder internal rotation    Shoulder external rotation    Elbow flexion    Elbow extension    Wrist flexion    Wrist extension    Wrist ulnar deviation    Wrist radial deviation    Wrist pronation    Wrist supination     (Blank rows = not tested) *=pain/symptoms  UPPER EXTREMITY MMT:  MMT Right eval Left eval  Shoulder flexion 4+ 4+  Shoulder  extension    Shoulder abduction 4+ 4+  Shoulder adduction    Shoulder extension    Shoulder internal rotation 5 5  Shoulder external rotation 4+ 4+  Middle trapezius    Lower trapezius    Elbow flexion 5 5  Elbow extension 5 4+  Wrist flexion 4+ 4  Wrist extension 5 4  Wrist ulnar deviation    Wrist radial deviation    Wrist pronation    Wrist supination    Grip strength 22 lbs 18 lbs   (Blank rows = not tested) *=pain/symptoms  CERVICAL SPECIAL TESTS:  Distraction test: Negative; no change in symptoms    TODAY'S TREATMENT:                                                                                                                              DATE:  03/31/24 Seated cervical retractions 2 x 10 Seated scap retractions 2x 10 Seated UT stretch 3 x 20 second holds Seated bilateral shoulder ER with scap retraction YTB 2 x 10 Seated horizontal abduction YTB 2 x 10 Manual STM to bilateral UT - not tolerated well Seated row YTB 2 x 15 Seated shoulder extension YTB 2 x 15  03/08/24  Seated cervical retractions 1 x 10 Seated scap retractions 1x 10  PATIENT EDUCATION:  Education details: Patient educated on exam findings, POC, scope of PT, HEP, relevant anatomy and biomechanics, and posture. 03/31/24: exercise mechanics, HEP Person educated: Patient Education method: Explanation, Demonstration,  and Handouts Education comprehension: verbalized understanding, returned demonstration, verbal cues required, and tactile cues required  HOME EXERCISE PROGRAM: Access Code: [email or web address] URL: [email or web address] Date: 03/08/2024 Prepared by: Prentice Damiya Sandefur  Exercises - Seated Cervical Retraction  - 3 x daily - 7 x weekly - 2 sets - 10 reps - Seated Scapular Retraction  - 3 x daily - 7 x weekly - 2 sets - 10 reps  ASSESSMENT:  CLINICAL IMPRESSION: Session limited to late arrival/delayed check in. Cueing proved for mechanics of previously completed HEP. Continued with mobility and postural strengthening. Attempted STM to UT/periscap but not tolerated well due to severe hyperactive and tender musculature despite grading pressure applied. Patient will continue to benefit from physical therapy in order to improve function and reduce impairment.   OBJECTIVE IMPAIRMENTS: decreased activity tolerance, decreased endurance, decreased mobility, decreased ROM, decreased strength, hypomobility, increased muscle spasms, impaired flexibility, impaired sensation, improper body mechanics, postural dysfunction, and pain.   ACTIVITY LIMITATIONS: carrying, lifting, bending, reach over head, hygiene/grooming, and caring for others  PARTICIPATION LIMITATIONS: meal prep, cleaning, laundry, shopping, community activity, and yard work  PERSONAL FACTORS: Hx of hypertension, hypothyroidism, OSA on CPAP, morbid obesity, diastolic heart failure, peripheral neuropathy, and insulin -dependent DM type II are also affecting patient's functional outcome.   REHAB POTENTIAL: Fair    CLINICAL DECISION MAKING: Unstable/unpredictable  EVALUATION COMPLEXITY: High   GOALS: Goals reviewed with patient? Yes  SHORT TERM GOALS: Target date: 04/05/2024  Patient will be independent with HEP in order to improve functional outcomes. Baseline: Goal status: INITIAL  2.  Patient will report at least 25%  improvement in symptoms for improved quality of life. Baseline:  Goal status: INITIAL   LONG TERM GOALS: Target date: 04/19/2024    Patient will report at least 75% improvement in symptoms for improved quality of life. Baseline:  Goal status: INITIAL  2.  Patient will improve NDI score by at least 10 points in order to indicate improved tolerance to activity. Baseline: 25/50 Goal status: INITIAL  3.  Patient will demonstrate at least 10 degree improvement in cervical ROM in all restricted planes for improved ability to move head with housework. Baseline:  Goal status: INITIAL  4.  Patient will report centralized cervical symptoms < 2 /10 for improved ability to use LUE.  Baseline:  Goal status: INITIAL      PLAN:  PT FREQUENCY: 1-2x/week  PT DURATION: 6 weeks  PLANNED INTERVENTIONS: 97164- PT Re-evaluation, 97110-Therapeutic exercises, 97530- Therapeutic activity, [id number]- Neuromuscular re-education, 97535- Self Care, [id number]- Manual therapy, [id number]- Gait training, [phone]- Orthotic Fit/training, [phone]- Canalith repositioning, [id number]- Aquatic Therapy, [phone]- Splinting, [phone]- Wound care (first 20 sq cm), 97598- Wound care (each additional 20 sq cm)Patient/Family education, Balance training, Stair training, Taping, Dry Needling, Joint mobilization, Joint manipulation, Spinal manipulation, Spinal mobilization, Scar mobilization, and DME instructions.  PLAN FOR NEXT SESSION: f/u with HEP, postural and UE strength, possibly manual for pain/mobility   Prentice GORMAN Stains, PT, DPT 03/31/2024, 11:57 AM   Date of referral: 12/14/23 Referring provider: Patel, Donika K, DO Referring diagnosis? M54.12 (ICD-10-CM) - Cervical radiculopathy Treatment diagnosis? (if different than referring diagnosis) Cervicalgia M54.2   What was this (referring dx) caused by? Ongoing Issue  [place] of Condition: Chronic (continuous duration > 3 months)   Laterality: Both  Current Functional Measure Score:  Neck Index 25/50  Objective measurements identify impairments when they are compared to normal values, the uninvolved extremity, and prior level of function.  [x]  Yes  []  No  Objective assessment of functional ability: Moderate functional limitations   Briefly describe symptoms: L arm and hand numbness  How did symptoms start: while watching TV  Average pain intensity:  Last 24 hours: 8/10  Past week: 8/10  How often does the pt experience symptoms? Constantly  How much have the symptoms interfered with usual daily activities? Moderately  How has condition changed since care began at this facility? NA - initial visit  In general, how is the patients overall health? Fair   BACK PAIN (STarT Back Screening Tool) No

## 2024-04-10 ENCOUNTER — Encounter (HOSPITAL_BASED_OUTPATIENT_CLINIC_OR_DEPARTMENT_OTHER): Payer: Self-pay | Admitting: Physical Therapy

## 2024-04-10 ENCOUNTER — Ambulatory Visit (HOSPITAL_BASED_OUTPATIENT_CLINIC_OR_DEPARTMENT_OTHER): Admitting: Physical Therapy

## 2024-04-10 DIAGNOSIS — M542 Cervicalgia: Secondary | ICD-10-CM | POA: Diagnosis not present

## 2024-04-10 DIAGNOSIS — R293 Abnormal posture: Secondary | ICD-10-CM

## 2024-04-10 DIAGNOSIS — M6281 Muscle weakness (generalized): Secondary | ICD-10-CM

## 2024-04-10 DIAGNOSIS — R29898 Other symptoms and signs involving the musculoskeletal system: Secondary | ICD-10-CM

## 2024-04-10 NOTE — Therapy (Signed)
 OUTPATIENT PHYSICAL THERAPY TREATMENT   Patient Name: Anita James MRN: [id number] DOB:19-Oct-1950, 73 y.o., female Today's Date: 04/10/2024  END OF SESSION:  PT End of Session - 04/10/24 1301     Visit Number 3    Number of Visits 12    Date for PT Re-Evaluation 04/19/24    Authorization Type [employer]ICARE    Progress Note Due on Visit 10    PT Start Time 1017    PT Stop Time 1055    PT Time Calculation (min) 38 min    Activity Tolerance Patient limited by pain    Behavior During Therapy [facility] for tasks assessed/performed           Past Medical History:  Diagnosis Date   Abdominal pain    Anxiety    Carpal tunnel syndrome 06/14/2014   Chest pain    CHF (congestive heart failure) (HCC)    Chronic back pain    Chronic kidney disease 03/2013   failure due to meds   Diabetes mellitus type II, uncontrolled    Diverticulitis    Frequent headaches    Hiatal hernia    Hyperlipemia    Hypertension    Hypothyroidism    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   Neuropathy    Obesity    Pneumonia    Vitamin D  deficiency 06/05/2014   Past Surgical History:  Procedure Laterality Date   CESAREAN SECTION     COLONOSCOPY WITH PROPOFOL  N/A 07/01/2021   Procedure: COLONOSCOPY WITH PROPOFOL ;  Surgeon: Saintclair Jasper, MD;  Location: [facility];  Service: Gastroenterology;  Laterality: N/A;   KNEE SURGERY Left    THYROID SURGERY     goiter   Patient Active Problem List   Diagnosis Date Noted   Numbness and tingling of left upper extremity 11/22/2023   Peripheral neuropathy 11/22/2023   Insulin  dependent type 2 diabetes mellitus (HCC) 11/22/2023   Urinary incontinence 11/22/2023   Elevated lactic acid level 11/22/2023   Chronic gout 11/22/2023   Reactive airway disease 11/22/2023   Osteopenia of neck of left femur 05/09/2020   Chondrocalcinosis 04/29/2020   OA (osteoarthritis) of knee 04/15/2020   Right hand pain 04/15/2020   OSA on CPAP 05/19/2017   Morbid obesity  due to excess calories (HCC) 03/17/2017   Neuropathic pain 11/04/2016   Carpal tunnel syndrome 06/14/2014   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   Cervical radiculopathy at C8 06/05/2014   Hyperlipemia 09/12/2013   Chronic diastolic CHF (congestive heart failure) (HCC) 09/12/2013   Major depression 09/12/2013   Allergic rhinitis 06/22/2013   Chronic pain syndrome 05/31/2013   Gastroesophageal reflux disease without esophagitis 08/30/2012   Essential hypertension    Hypothyroidism    Type 2 diabetes, uncontrolled, with neuropathy     PCP: Carlie Rogelia LABOR, PA-C  REFERRING PROVIDER: Tobie Tonita POUR, DO  REFERRING DIAG: [phone] (ICD-10-CM) - Cervical radiculopathy  THERAPY DIAG:  Cervicalgia  Muscle weakness (generalized)  Abnormal posture  Other symptoms and signs involving the musculoskeletal system  Rationale for Evaluation and Treatment: Rehabilitation  ONSET DATE: Feb/March 2025  SUBJECTIVE:  SUBJECTIVE STATEMENT: Patient states she is feeling the same.  Not feeling well today  EVAL: Patient states pinched nerve in neck. Patient states numb feeling in L UE and middle finger. Some numbness in L flank as well. Injections for suspected shingles in R flank/breast area but shingles never broke the skin. Can use LUE but can't feel it. Symptoms began while watching TV.   PERTINENT HISTORY:  Hx of hypertension, hypothyroidism, OSA on CPAP, morbid obesity, diastolic heart failure, peripheral neuropathy, and insulin -dependent DM type II  PAIN:  Are you having pain? Yes: NPRS scale: 10/10 Pain location: LUE, neck  Pain description: numbness Aggravating factors: constant Relieving factors: none  PRECAUTIONS: None  WEIGHT BEARING RESTRICTIONS: No  FALLS:  Has patient fallen in  last 6 months? No  PLOF: Independent  PATIENT GOALS: to get well  OBJECTIVE: (objective measures from initial evaluation unless otherwise dated)  DIAGNOSTIC FINDINGS:  MRI cervical spine 11/22/2023: 1. Motion degraded exam. 2. Multilevel cervical spondylosis with resultant mild spinal stenosis at C5-6 and C6-7. 3. Multifactorial degenerative changes with resultant multilevel foraminal narrowing as above. Notable findings include mild left C4 and C5 foraminal stenosis, mild to moderate bilateral C6 foraminal narrowing, with moderate left and mild right C7 foraminal stenosis.  PATIENT SURVEYS:  NDI:  NECK DISABILITY INDEX  Date: 03/08/24 Score  Pain intensity 2 = The pain is moderate at the moment  2. Personal care (washing, dressing, etc.) 2 = It is painful to look after myself and I am slow and careful  3. Lifting 4 =  I can only lift very light weights  4. Reading 4 =  I can hardly read at all because of severe pain in my neck  5. Headaches 3 = I have moderate headaches, which come frequently  6. Concentration 0 =  I can concentrate fully when I want to with no difficulty  7. Work 3 =  I cannot do my usual work  8. Driving 1 =  I can drive my car as long as I want with slight pain in my neck  9. Sleeping 1 = My sleep is slightly disturbed (less than 1 hr sleepless)  10. Recreation 5 = I can't do any recreation activities at all  Total 25/50   Minimum Detectable Change (90% confidence): 5 points or 10% points  COGNITION: Overall cognitive status: Within functional limits for tasks assessed  SENSATION: Impaired L C7  POSTURE: rounded shoulders, forward head, and increased thoracic kyphosis  PALPATION: Grossly TTP cervical paraspinals, UT, periscap musculature   CERVICAL ROM:   Active ROM A/PROM (deg) eval  Flexion 35*  Extension 20*  Right lateral flexion 20*  Left lateral flexion 27*  Right rotation 55*  Left rotation 37*   (Blank rows = not tested)  *=pain/symptoms  UPPER EXTREMITY ROM: limited bilateral UE mobility overhead with R more limited than L   Active ROM Right eval Left eval  Shoulder flexion    Shoulder extension    Shoulder abduction    Shoulder adduction    Shoulder extension    Shoulder internal rotation    Shoulder external rotation    Elbow flexion    Elbow extension    Wrist flexion    Wrist extension    Wrist ulnar deviation    Wrist radial deviation    Wrist pronation    Wrist supination     (Blank rows = not tested) *=pain/symptoms  UPPER EXTREMITY MMT:  MMT Right eval Left eval  Shoulder flexion 4+ 4+  Shoulder extension    Shoulder abduction 4+ 4+  Shoulder adduction    Shoulder extension    Shoulder internal rotation 5 5  Shoulder external rotation 4+ 4+  Middle trapezius    Lower trapezius    Elbow flexion 5 5  Elbow extension 5 4+  Wrist flexion 4+ 4  Wrist extension 5 4  Wrist ulnar deviation    Wrist radial deviation    Wrist pronation    Wrist supination    Grip strength 22 lbs 18 lbs   (Blank rows = not tested) *=pain/symptoms  CERVICAL SPECIAL TESTS:  Distraction test: Negative; no change in symptoms    TODAY'S TREATMENT:                                                                                                                              DATE:  04/10/24 Seated cervical retraction x 10 Seated shoulder rolls x 5 R/L cervical lateral flexion for stretch Seated row with yellow band x 10 Self care: pt shown use of massage cane to neck and periscapular muscles; pt returned demo with cues.  Manual: SMT (light) to cervical paraspinals and Lt periscapular muscles in Rt sidelying   Rt sidelying, Lt overhead stretch Hooklying with 2 pillows - trial of partial snow angels (pain in Rt pec/shoulder) x 5; touch down motion x 1  - requires assist to return to seated position Standing Rt low doorway stretch for biceps brachii and pec (not tolerated due to pain in knees) Seated  UE stretch, reaching single arm back with hand on table  R/L x 15 sec each   03/31/24 Seated cervical retractions 2 x 10 Seated scap retractions 2x 10 Seated UT stretch 3 x 20 second holds Seated bilateral shoulder ER with scap retraction YTB 2 x 10 Seated horizontal abduction YTB 2 x 10 Manual STM to bilateral UT - not tolerated well Seated row YTB 2 x 15 Seated shoulder extension YTB 2 x 15  03/08/24  Seated cervical retractions 1 x 10 Seated scap retractions 1x 10  PATIENT EDUCATION:  Education details: Patient educated on exam findings, POC, scope of PT, HEP, relevant anatomy and biomechanics, and posture. 03/31/24: exercise mechanics, HEP Person educated: Patient Education method: Explanation, Demonstration, and Handouts Education comprehension: verbalized understanding, returned demonstration, verbal cues required, and tactile cues required  HOME EXERCISE PROGRAM: Access Code: [email or web address] URL: [email or web address] Date: 03/08/2024 Prepared by: Prentice Zaunegger  Exercises - Seated Cervical Retraction  - 3 x daily - 7 x weekly - 2 sets - 10 reps - Seated Scapular Retraction  - 3 x daily - 7 x weekly - 2 sets - 10 reps  ASSESSMENT:  CLINICAL IMPRESSION: Limited tolerance for exercise due to elevated pain levels upon arrival. Does best with seated exercise as pt states, knees are bone on bone and pt cannot tolerate standing well, and she required increased time and CGA for transition from hooklying  to seated position.  Continue pain sensitivity with light to medium touch with STM to Lt neck and periscapular muscles. Pt reported increased Rt pec/shoulder pain after lying on Rt side and pushing up to seated position. Pt may benefit from aquatic session to show her exercises she can do in her housing complex pool.  Patient will continue to benefit from physical therapy in order to improve function and reduce impairment.  No goals met yet.   OBJECTIVE IMPAIRMENTS:  decreased activity tolerance, decreased endurance, decreased mobility, decreased ROM, decreased strength, hypomobility, increased muscle spasms, impaired flexibility, impaired sensation, improper body mechanics, postural dysfunction, and pain.   ACTIVITY LIMITATIONS: carrying, lifting, bending, reach over head, hygiene/grooming, and caring for others  PARTICIPATION LIMITATIONS: meal prep, cleaning, laundry, shopping, community activity, and yard work  PERSONAL FACTORS: Hx of hypertension, hypothyroidism, OSA on CPAP, morbid obesity, diastolic heart failure, peripheral neuropathy, and insulin -dependent DM type II are also affecting patient's functional outcome.   REHAB POTENTIAL: Fair    CLINICAL DECISION MAKING: Unstable/unpredictable  EVALUATION COMPLEXITY: High   GOALS: Goals reviewed with patient? Yes  SHORT TERM GOALS: Target date: 04/05/2024    Patient will be independent with HEP in order to improve functional outcomes. Baseline: Goal status: INITIAL  2.  Patient will report at least 25% improvement in symptoms for improved quality of life. Baseline:  Goal status: INITIAL   LONG TERM GOALS: Target date: 04/19/2024    Patient will report at least 75% improvement in symptoms for improved quality of life. Baseline:  Goal status: INITIAL  2.  Patient will improve NDI score by at least 10 points in order to indicate improved tolerance to activity. Baseline: 25/50 Goal status: INITIAL  3.  Patient will demonstrate at least 10 degree improvement in cervical ROM in all restricted planes for improved ability to move head with housework. Baseline:  Goal status: INITIAL  4.  Patient will report centralized cervical symptoms < 2 /10 for improved ability to use LUE.  Baseline:  Goal status: INITIAL      PLAN:  PT FREQUENCY: 1-2x/week  PT DURATION: 6 weeks  PLANNED INTERVENTIONS: 97164- PT Re-evaluation, 97110-Therapeutic exercises, 97530- Therapeutic activity, [id number]-  Neuromuscular re-education, 97535- Self Care, [id number]- Manual therapy, [id number]- Gait training, [phone]- Orthotic Fit/training, [phone]- Canalith repositioning, [id number]- Aquatic Therapy, [phone]- Splinting, [phone]- Wound care (first 20 sq cm), 97598- Wound care (each additional 20 sq cm)Patient/Family education, Balance training, Stair training, Taping, Dry Needling, Joint mobilization, Joint manipulation, Spinal manipulation, Spinal mobilization, Scar mobilization, and DME instructions.  PLAN FOR NEXT SESSION: f/u with HEP, postural and UE strength, possibly manual for pain/mobility   Delon Aquas, PTA 04/10/24 1:18 PM [facility] [address] [place], [place], [address] Phone: [phone]   Fax:  [phone]   Date of referral: 12/14/23 Referring provider: Patel, Donika K, DO Referring diagnosis? M54.12 (ICD-10-CM) - Cervical radiculopathy Treatment diagnosis? (if different than referring diagnosis) Cervicalgia M54.2   What was this (referring dx) caused by? Ongoing Issue  Lysle of Condition: Chronic (continuous duration > 3 months)   Laterality: Both  Current Functional Measure Score: Neck Index 25/50  Objective measurements identify impairments when they are compared to normal values, the uninvolved extremity, and prior level of function.  [x]  Yes  []  No  Objective assessment of functional ability: Moderate functional limitations   Briefly describe symptoms: L arm and hand numbness  How did symptoms start: while watching TV  Average pain intensity:  Last 24 hours: 8/10  Past week: 8/10  How often does the pt experience symptoms? Constantly  How much have the symptoms interfered with usual daily activities? Moderately  How has condition changed since care began at this facility? NA - initial visit  In general, how is the patients overall health? Fair   BACK PAIN (STarT Back Screening Tool) No

## 2024-04-12 ENCOUNTER — Emergency Department (HOSPITAL_BASED_OUTPATIENT_CLINIC_OR_DEPARTMENT_OTHER)

## 2024-04-12 ENCOUNTER — Emergency Department (HOSPITAL_BASED_OUTPATIENT_CLINIC_OR_DEPARTMENT_OTHER): Admitting: Radiology

## 2024-04-12 ENCOUNTER — Other Ambulatory Visit: Payer: Self-pay

## 2024-04-12 ENCOUNTER — Emergency Department (HOSPITAL_BASED_OUTPATIENT_CLINIC_OR_DEPARTMENT_OTHER)
Admission: EM | Admit: 2024-04-12 | Discharge: 2024-04-12 | Disposition: A | Discharge status: Home / Self Care | Source: Ambulatory Visit | Attending: Emergency Medicine | Admitting: Emergency Medicine

## 2024-04-12 ENCOUNTER — Encounter (HOSPITAL_BASED_OUTPATIENT_CLINIC_OR_DEPARTMENT_OTHER): Payer: Self-pay | Admitting: Physical Therapy

## 2024-04-12 ENCOUNTER — Ambulatory Visit (HOSPITAL_BASED_OUTPATIENT_CLINIC_OR_DEPARTMENT_OTHER): Admitting: Physical Therapy

## 2024-04-12 ENCOUNTER — Encounter (HOSPITAL_BASED_OUTPATIENT_CLINIC_OR_DEPARTMENT_OTHER): Payer: Self-pay | Admitting: Emergency Medicine

## 2024-04-12 DIAGNOSIS — I110 Hypertensive heart disease with heart failure: Secondary | ICD-10-CM | POA: Diagnosis not present

## 2024-04-12 DIAGNOSIS — M6281 Muscle weakness (generalized): Secondary | ICD-10-CM

## 2024-04-12 DIAGNOSIS — R0789 Other chest pain: Secondary | ICD-10-CM | POA: Diagnosis present

## 2024-04-12 DIAGNOSIS — Z7982 Long term (current) use of aspirin: Secondary | ICD-10-CM | POA: Insufficient documentation

## 2024-04-12 DIAGNOSIS — M542 Cervicalgia: Secondary | ICD-10-CM

## 2024-04-12 DIAGNOSIS — R944 Abnormal results of kidney function studies: Secondary | ICD-10-CM | POA: Diagnosis not present

## 2024-04-12 DIAGNOSIS — Z794 Long term (current) use of insulin: Secondary | ICD-10-CM | POA: Diagnosis not present

## 2024-04-12 DIAGNOSIS — I509 Heart failure, unspecified: Secondary | ICD-10-CM | POA: Insufficient documentation

## 2024-04-12 DIAGNOSIS — R29898 Other symptoms and signs involving the musculoskeletal system: Secondary | ICD-10-CM

## 2024-04-12 DIAGNOSIS — E119 Type 2 diabetes mellitus without complications: Secondary | ICD-10-CM | POA: Insufficient documentation

## 2024-04-12 DIAGNOSIS — R293 Abnormal posture: Secondary | ICD-10-CM

## 2024-04-12 DIAGNOSIS — R7989 Other specified abnormal findings of blood chemistry: Secondary | ICD-10-CM

## 2024-04-12 LAB — HEPATIC FUNCTION PANEL
ALT: 11 U/L (ref 0–44)
AST: 19 U/L (ref 15–41)
Albumin: 3.9 g/dL (ref 3.5–5.0)
Alkaline Phosphatase: 84 U/L (ref 38–126)
Bilirubin, Direct: 0.1 mg/dL (ref 0.0–0.2)
Indirect Bilirubin: 0.2 mg/dL — ABNORMAL LOW (ref 0.3–0.9)
Total Bilirubin: 0.4 mg/dL (ref 0.0–1.2)
Total Protein: 6.7 g/dL (ref 6.5–8.1)

## 2024-04-12 LAB — CBC
HCT: 40.9 % (ref 36.0–46.0)
Hemoglobin: 13.9 g/dL (ref 12.0–15.0)
MCH: 30.7 pg (ref 26.0–34.0)
MCHC: 34 g/dL (ref 30.0–36.0)
MCV: 90.3 fL (ref 80.0–100.0)
Platelets: 192 K/uL (ref 150–400)
RBC: 4.53 MIL/uL (ref 3.87–5.11)
RDW: 13.2 % (ref 11.5–15.5)
WBC: 7.9 K/uL (ref 4.0–10.5)
nRBC: 0 % (ref 0.0–0.2)

## 2024-04-12 LAB — D-DIMER, QUANTITATIVE: D-Dimer, Quant: 0.95 ug{FEU}/mL — ABNORMAL HIGH (ref 0.00–0.50)

## 2024-04-12 LAB — BASIC METABOLIC PANEL WITH GFR
Anion gap: 14 (ref 5–15)
BUN: 28 mg/dL — ABNORMAL HIGH (ref 8–23)
CO2: 26 mmol/L (ref 22–32)
Calcium: 9.6 mg/dL (ref 8.9–10.3)
Chloride: 102 mmol/L (ref 98–111)
Creatinine, Ser: 1.53 mg/dL — ABNORMAL HIGH (ref 0.44–1.00)
GFR, Estimated: 36 mL/min — ABNORMAL LOW (ref >60)
Glucose, Bld: 161 mg/dL — ABNORMAL HIGH (ref 70–99)
Potassium: 4.5 mmol/L (ref 3.5–5.1)
Sodium: 141 mmol/L (ref 135–145)

## 2024-04-12 LAB — TROPONIN T, HIGH SENSITIVITY
Troponin T High Sensitivity: 26 ng/L — ABNORMAL HIGH (ref <19)
Troponin T High Sensitivity: 23 ng/L — ABNORMAL HIGH (ref <19)

## 2024-04-12 LAB — PRO BRAIN NATRIURETIC PEPTIDE: Pro Brain Natriuretic Peptide: 55.5 pg/mL (ref <300.0)

## 2024-04-12 MED ORDER — IOHEXOL 350 MG/ML SOLN
100.0000 mL | Freq: Once | INTRAVENOUS | Status: AC | PRN
  Administered 2024-04-12: 80 mL via INTRAVENOUS

## 2024-04-12 MED ORDER — PANTOPRAZOLE SODIUM 40 MG IV SOLR
40.0000 mg | Freq: Once | INTRAVENOUS | Status: AC
  Administered 2024-04-12: 40 mg via INTRAVENOUS
  Filled 2024-04-12: qty 10

## 2024-04-12 MED ORDER — LACTATED RINGERS IV BOLUS
1000.0000 mL | Freq: Once | INTRAVENOUS | Status: AC
  Administered 2024-04-12: 1000 mL via INTRAVENOUS

## 2024-04-12 NOTE — ED Provider Notes (Signed)
 [place] EMERGENCY DEPARTMENT AT [facility] Provider Note   CSN: [id number] Arrival date & time: 04/12/24  1228     Patient presents with: Chest Pain   Anita James is a 73 y.o. female with history of CHF, type 2 diabetes, obesity, hyperlipidemia, hypertension, presents with concern for right-sided chest pain that started yesterday.  States the pain is constant and involves the area between her breast and spreads up to the right shoulder.  She feels like she cannot take deep breaths in, and states this felt similar to when she had a pneumonia.  Denies any shortness of breath.  Denies any pain with exertion or pain that radiates to the back.  She denies any cough, fevers, or chills.  Reports she does have more swelling than normal on her feet.  Denies any calf pain or swelling.  She is not on any anticoagulation.  Does take a daily baby aspirin .    Chest Pain      Prior to Admission medications   Medication Sig Start Date End Date Taking? Authorizing Provider  albuterol (VENTOLIN HFA) 108 (90 Base) MCG/ACT inhaler Inhale 2 puffs into the lungs 3 (three) times daily as needed. 03/23/24  Yes [provider]  atorvastatin  (LIPITOR ) 40 MG tablet Take 40 mg by mouth at bedtime. 03/08/24  Yes [provider]  Continuous Glucose Sensor (FREESTYLE LIBRE 3 PLUS SENSOR) MISC SMARTSIG:Every 3 Days 04/04/24  Yes [provider]  hydrochlorothiazide  (MICROZIDE ) 12.5 MG capsule Take 12.5 mg by mouth daily. 03/23/24  Yes [provider]  LIVALO 4 MG TABS Take 1 tablet by mouth at bedtime. 01/15/24  Yes [provider]  PREMARIN vaginal cream Place vaginally. 03/23/24  Yes [provider]  Accu-Chek FastClix Lancets MISC USE AS DIRECTED TO CHECK BLOOD SUGAR THREE TIMES DAILY 11/07/19   Vicci Barnie NOVAK, MD  ACCU-CHEK GUIDE test strip USE AS DIRECTED TO CHECK BLOOD SUGAR THREE TIMES DAILY 10/04/19   Vicci Barnie NOVAK, MD  albuterol (ACCUNEB) 0.63  MG/3ML nebulizer solution Take 1 ampule by nebulization 3 (three) times daily as needed for wheezing or shortness of breath. 01/07/21   [provider]  allopurinol  (ZYLOPRIM ) 100 MG tablet Take 100 mg by mouth daily.    [provider]  aspirin  EC 81 MG tablet Take 81 mg by mouth daily. 11/20/16   [provider]  atorvastatin  (LIPITOR ) 80 MG tablet Take 1 tablet (80 mg total) by mouth daily. 11/23/23   Pokhrel, Laxman, MD  Blood Glucose Monitoring Suppl (ACCU-CHEK AVIVA PLUS) w/Device KIT USE AS DIRECTED TO TEST TWICE DAILY 04/27/18   Vicci Barnie NOVAK, MD  Blood Pressure Monitoring KIT Use daily. 12/24/23   Court Dorn PARAS, MD  Budeson-Glycopyrrol-Formoterol  (BREZTRI AEROSPHERE) 160-9-4.8 MCG/ACT AERO Inhale 2 puffs into the lungs in the morning and at bedtime.    [provider]  carvedilol  (COREG ) 25 MG tablet Take 25 mg by mouth 2 (two) times daily.    [provider]  cetirizine  (ZYRTEC ) 10 MG tablet Take 10 mg by mouth daily as needed for allergies.    [provider]  diclofenac  sodium (VOLTAREN ) 1 % GEL Apply 4 g topically 4 (four) times daily. Patient taking differently: Apply 4 g topically daily as needed (pain). 02/27/19   Emil Share, DO  empagliflozin (JARDIANCE) 10 MG TABS tablet Take 10 mg by mouth daily.    [provider]  feeding supplement, GLUCERNA SHAKE, (GLUCERNA SHAKE) LIQD Take 237 mLs by mouth every  morning.    [provider]  fluticasone  (FLONASE ) 50 MCG/ACT nasal spray PLACE 2 SPRAYS IN EACH NOSTRIL DAILY AS NEEDED FOR ALLERGIES 07/21/19   Vicci Barnie NOVAK, MD  insulin  aspart (NOVOLOG  FLEXPEN) 100 UNIT/ML FlexPen Inject 60 Units into the skin 3 (three) times daily with meals. Patient taking differently: Inject 20-45 Units into the skin 3 (three) times daily after meals. 12/05/19   Vicci Barnie NOVAK, MD  Insulin  Glargine (LANTUS  SOLOSTAR) 100 UNIT/ML Solostar Pen Inject 84 Units into the skin daily. Patient  taking differently: Inject 100 Units into the skin daily. 10/13/19   Vicci Barnie NOVAK, MD  Insulin  Pen Needle (TRUEPLUS PEN NEEDLES) 32G X 4 MM MISC Use to inject insulin . 10/13/18   Vicci Barnie NOVAK, MD  levothyroxine  (SYNTHROID ) 100 MCG tablet Take 1 tablet (100 mcg total) by mouth daily. 10/13/19   Vicci Barnie NOVAK, MD  lidocaine  (LIDODERM ) 5 % Place 1 patch onto the skin every 12 (twelve) hours. Remove & Discard patch within 12 hours or as directed by MD 10/19/23   Qiao, Jana W, MD  NOVOFINE 32G X 6 MM MISC SMARTSIG:1 SUB-Q 4-5 Times Daily 01/22/20   [provider]  NYSTATIN  powder APPLY TOPICALLY 4 TIMES DAILY. Patient taking differently: Apply 1 Bottle topically 4 (four) times daily as needed (rash). 01/18/17   Marilyne Stephane DASEN, MD  omeprazole (PRILOSEC) 20 MG capsule Take 20 mg by mouth daily.    [provider]  pregabalin  (LYRICA ) 75 MG capsule Take 75 mg by mouth 2 (two) times daily.    [provider]  torsemide  (DEMADEX ) 20 MG tablet Take 20 mg by mouth daily as needed (fluid). 05/09/18   [provider]  Zinc Acetate 50 MG CAPS Take 1 capsule by mouth in the morning and at bedtime.    [provider]    Allergies: Amoxicillin-pot clavulanate, Oxycodone , Sulfa antibiotics, Amoxicillin, Norvasc  [amlodipine  besylate], Penicillins, Sulfamethoxazole, and Tramadol     Review of Systems  Cardiovascular:  Positive for chest pain.    Updated Vital Signs BP (!) 159/73   Pulse 77   Temp 98 F (36.7 C) (Oral)   Resp 18   Ht 5' 5 (1.651 m)   Wt 136.1 kg   SpO2 91%   BMI 49.92 kg/m   Physical Exam Vitals and nursing note reviewed.  Constitutional:      General: She is not in acute distress.    Appearance: She is well-developed.  HENT:     Head: Normocephalic and atraumatic.  Eyes:     Conjunctiva/sclera: Conjunctivae normal.  Cardiovascular:     Rate and Rhythm: Normal rate and regular rhythm.     Heart sounds: No murmur  heard. Pulmonary:     Effort: Pulmonary effort is normal. No respiratory distress.     Breath sounds: Normal breath sounds.  Abdominal:     Palpations: Abdomen is soft.     Tenderness: There is no abdominal tenderness.  Musculoskeletal:        General: No swelling.     Cervical back: Neck supple.     Right lower leg: No edema.     Left lower leg: No edema.     Comments: No calf tenderness to palpation bilaterally  Skin:    General: Skin is warm and dry.     Capillary Refill: Capillary refill takes less than 2 seconds.  Neurological:     Mental Status: She is alert.  Psychiatric:  Mood and Affect: Mood normal.     (all labs ordered are listed, but only abnormal results are displayed) Labs Reviewed  BASIC METABOLIC PANEL WITH GFR - Abnormal; Notable for the following components:      Result Value   Glucose, Bld 161 (*)    BUN 28 (*)    Creatinine, Ser 1.53 (*)    GFR, Estimated 36 (*)    All other components within normal limits  D-DIMER, QUANTITATIVE - Abnormal; Notable for the following components:   D-Dimer, Quant 0.95 (*)    All other components within normal limits  HEPATIC FUNCTION PANEL - Abnormal; Notable for the following components:   Indirect Bilirubin 0.2 (*)    All other components within normal limits  TROPONIN T, HIGH SENSITIVITY - Abnormal; Notable for the following components:   Troponin T High Sensitivity 26 (*)    All other components within normal limits  TROPONIN T, HIGH SENSITIVITY - Abnormal; Notable for the following components:   Troponin T High Sensitivity 23 (*)    All other components within normal limits  CBC  PRO BRAIN NATRIURETIC PEPTIDE    EKG: EKG Interpretation Date/Time:  Wednesday April 12 2024 12:38:30 EDT Ventricular Rate:  77 PR Interval:  196 QRS Duration:  100 QT Interval:  398 QTC Calculation: 450 R Axis:   -29  Text Interpretation: Normal sinus rhythm Minimal voltage criteria for LVH, may be normal variant ( R in  aVL ) Possible Lateral infarct , age undetermined Abnormal ECG No significant change since last tracing Confirmed by Emil Share [phone]) on 04/12/2024 1:26:48 PM  Radiology: CT Angio Chest PE W and/or Wo Contrast Result Date: 04/12/2024 CLINICAL DATA:  Right-sided chest pain EXAM: CT ANGIOGRAPHY CHEST WITH CONTRAST TECHNIQUE: Multidetector CT imaging of the chest was performed using the standard protocol during bolus administration of intravenous contrast. Multiplanar CT image reconstructions and MIPs were obtained to evaluate the vascular anatomy. RADIATION DOSE REDUCTION: This exam was performed according to the departmental dose-optimization program which includes automated exposure control, adjustment of the mA and/or kV according to patient size and/or use of iterative reconstruction technique. CONTRAST:  80mL OMNIPAQUE  IOHEXOL  350 MG/ML SOLN COMPARISON:  Chest x-ray 04/12/2024, CT chest 05/01/2023 FINDINGS: Cardiovascular: Satisfactory opacification of the pulmonary arteries to the segmental level. No evidence of pulmonary embolism. Nonaneurysmal aorta. No dissection. Mild atherosclerosis. No pericardial effusion Mediastinum/Nodes: Patent trachea. No thyroid mass. No suspicious lymph nodes. Esophagus within normal limits. Lungs/Pleura: No acute airspace disease, pleural effusion, or pneumothorax. Stable small pulmonary nodules in the right lower lobe measuring up to 4 mm, for example series 6, image 92, no specific imaging follow-up is recommended. Upper Abdomen: No acute finding Musculoskeletal: No acute osseous abnormality Review of the MIP images confirms the above findings. IMPRESSION: 1. Negative for acute pulmonary embolus or aortic dissection. No acute intrathoracic abnormality. 2. Aortic atherosclerosis. Aortic Atherosclerosis (ICD10-I70.0). Electronically Signed   By: Luke Bun M.D.   On: 04/12/2024 16:45   DG Chest 2 View Result Date: 04/12/2024 CLINICAL DATA:  Chest pain. EXAM: CHEST - 2  VIEW COMPARISON:  April 30, 2023. FINDINGS: The heart size and mediastinal contours are within normal limits. Both lungs are clear. The visualized skeletal structures are unremarkable. IMPRESSION: No active cardiopulmonary disease. Electronically Signed   By: Lynwood Landy Raddle M.D.   On: 04/12/2024 13:34     Procedures   Medications Ordered in the ED  lactated ringers  bolus 1,000 mL (1,000 mLs Intravenous New Bag/Given 04/12/24  1455)  iohexol  (OMNIPAQUE ) 350 MG/ML injection 100 mL (80 mLs Intravenous Contrast Given 04/12/24 1621)  pantoprazole  (PROTONIX ) injection 40 mg (40 mg Intravenous Given 04/12/24 1709)                HEART Score: 5                    Medical Decision Making Amount and/or Complexity of Data Reviewed Labs: ordered. Radiology: ordered.  Risk Prescription drug management.     Differential diagnosis includes but is not limited to ACS, arrhythmia, aortic aneurysm, pericarditis, myocarditis, pericardial effusion, cardiac tamponade, musculoskeletal pain, GERD, Boerhaave's syndrome, DVT/PE, pneumonia, pleural effusion   ED Course:  Upon initial evaluation, patient is well-appearing, no acute distress.  Stable vitals.  Reporting pain that starts in between her breasts and goes up to the right shoulder.  This has been ongoing for the past 4 days.  Does report she feels like it is difficult to take deep breaths.  No lower extremity pain or swelling.   Labs Ordered: I Ordered, and personally interpreted labs.  The pertinent results include:   Initial troponin of 26, repeat 23  D-dimer elevated at 0.95 BNP within normal limits BMP with elevated creatinine 1.53, this is up from 1.1 taken 3 months ago CBC within normal limits Hepatic function panel within normal limits   Imaging Studies ordered: I ordered imaging studies including chest x-ray, CTA chest I independently visualized the imaging with scope of interpretation limited to determining acute life threatening  conditions related to emergency care. Imaging showed  No acute abnormalities on chest x-ray or CT of the chest.  No evidence of pulmonary embolism I agree with the radiologist interpretation   Cardiac Monitoring: / EKG: The patient was maintained on a cardiac monitor.  I personally viewed and interpreted the cardiac monitored which showed an underlying rhythm of: Normal sinus rhythm   Medications Given: Protonix   Upon re-evaluation, patient remains well-appearing with stable vitals.   Low concern for ACS at this time given troponin remains stable with initial troponin of 26 and repeat of 23, pain non-exertional and ongoing for 4 days, and EKG with normal sinus rhythm and no ST changes. HEART score is 5.  Did consider admission for observation, but ultimately feel that this is not consistent with cardiac etiology based off her symptoms of nonexertional chest pain and reassuring flat troponins. Chest x-ray without any acute abnormality.  CT of the chest without any evidence of pulmonary embolism.  Does not appear fluid overloaded on exam and BNP within normal limits, low concern for CHF. Patient's abdomen was soft and nontender, and no abnormalities noted on hepatic function panel, low concern for biliary pathology at this time. Patient did have elevated creatinine. Was given fluids for possible AKI secondary to pre-renal cause/dehydration.  She states she has a follow-up with her kidney doctor tomorrow. I reviewed patient case with my attending Dr. Bari who agrees with plan of care  Patient stable and appropriate for discharge home  Impression: Atypical chest pain  Disposition:  The patient was discharged home with instructions to attend her nephrology appointment tomorrow morning.  With her PCP within the next week for recheck of symptoms and further evaluation. Return precautions given.    Record Review: External records from outside source obtained and reviewed including echo from  March 2025 with LVEF of 60 to 65%, grade 1 diastolic dysfunction noted     This chart was dictated using voice recognition software,  Dragon. Despite the best efforts of this provider to proofread and correct errors, errors may still occur which can change documentation meaning.       Final diagnoses:  Atypical chest pain  Elevated serum creatinine    ED Discharge Orders     None          Anita Palma, PA-C 04/12/24 1736    Anita Roxie HERO, DO 04/13/24 0030

## 2024-04-12 NOTE — Discharge Instructions (Addendum)
 Your workup today is reassuring. It is very unlikely that your pain is due to an issue in your heart.  Your cardiac enzyme (troponin) was slightly elevated, but this stayed the same throughout your stay. This appears to be your baseline troponin.  Your EKG which is a measure of the heart's electrical activity and rhythm is normal today. These would both show abnormalities if you were having a heart attack.  Your chest x-ray is normal today. Your CT did not show any signs of a blood clot in your lungs.  Your liver labs and blood counts are all normal.  Your creatinine which is a measure of kidney function was abnormal today. This may be due to dehydration, and you were given fluids here. Please attend your appointment with your kidney doctor tomorrow scheduled.  Please follow-up with your PCP within the next week for recheck of symptoms and further evaluation.  Return to the ER if you have any shortness of breath, difficulty breathing, worsening chest pain, dizziness, jaw pain, left arm or shoulder pain, abdominal pain, unexplained fever, any other new or concerning symptoms.

## 2024-04-12 NOTE — Therapy (Signed)
 OUTPATIENT PHYSICAL THERAPY TREATMENT   Patient Name: Anita James MRN: [id number] DOB:1951-08-12, 73 y.o., female Today's Date: 04/12/2024  END OF SESSION:  PT End of Session - 04/12/24 1212     Visit Number 4    Number of Visits 12    Date for PT Re-Evaluation 04/19/24    Authorization Type [employer]    Progress Note Due on Visit 10    PT Start Time 1154    PT Stop Time 1220    PT Time Calculation (min) 26 min    Activity Tolerance Patient limited by pain    Behavior During Therapy [facility] for tasks assessed/performed            Past Medical History:  Diagnosis Date   Abdominal pain    Anxiety    Carpal tunnel syndrome 06/14/2014   Chest pain    CHF (congestive heart failure) (HCC)    Chronic back pain    Chronic kidney disease 03/2013   failure due to meds   Diabetes mellitus type II, uncontrolled    Diverticulitis    Frequent headaches    Hiatal hernia    Hyperlipemia    Hypertension    Hypothyroidism    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   Neuropathy    Obesity    Pneumonia    Vitamin D  deficiency 06/05/2014   Past Surgical History:  Procedure Laterality Date   CESAREAN SECTION     COLONOSCOPY WITH PROPOFOL  N/A 07/01/2021   Procedure: COLONOSCOPY WITH PROPOFOL ;  Surgeon: Saintclair Jasper, MD;  Location: [facility];  Service: Gastroenterology;  Laterality: N/A;   KNEE SURGERY Left    THYROID SURGERY     goiter   Patient Active Problem List   Diagnosis Date Noted   Numbness and tingling of left upper extremity 11/22/2023   Peripheral neuropathy 11/22/2023   Insulin  dependent type 2 diabetes mellitus (HCC) 11/22/2023   Urinary incontinence 11/22/2023   Elevated lactic acid level 11/22/2023   Chronic gout 11/22/2023   Reactive airway disease 11/22/2023   Osteopenia of neck of left femur 05/09/2020   Chondrocalcinosis 04/29/2020   OA (osteoarthritis) of knee 04/15/2020   Right hand pain 04/15/2020   OSA on CPAP 05/19/2017   Morbid  obesity due to excess calories (HCC) 03/17/2017   Neuropathic pain 11/04/2016   Carpal tunnel syndrome 06/14/2014   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   Cervical radiculopathy at C8 06/05/2014   Hyperlipemia 09/12/2013   Chronic diastolic CHF (congestive heart failure) (HCC) 09/12/2013   Major depression 09/12/2013   Allergic rhinitis 06/22/2013   Chronic pain syndrome 05/31/2013   Gastroesophageal reflux disease without esophagitis 08/30/2012   Essential hypertension    Hypothyroidism    Type 2 diabetes, uncontrolled, with neuropathy     PCP: Carlie Rogelia LABOR, PA-C  REFERRING PROVIDER: Tobie Tonita POUR, DO  REFERRING DIAG: [phone] (ICD-10-CM) - Cervical radiculopathy  THERAPY DIAG:  Cervicalgia  Muscle weakness (generalized)  Abnormal posture  Other symptoms and signs involving the musculoskeletal system  Rationale for Evaluation and Treatment: Rehabilitation  ONSET DATE: Feb/March 2025  SUBJECTIVE:  SUBJECTIVE STATEMENT: Patient states she is feeling the same.  Not feeling well today  EVAL: Patient states pinched nerve in neck. Patient states numb feeling in L UE and middle finger. Some numbness in L flank as well. Injections for suspected shingles in R flank/breast area but shingles never broke the skin. Can use LUE but can't feel it. Symptoms began while watching TV.   PERTINENT HISTORY:  Hx of hypertension, hypothyroidism, OSA on CPAP, morbid obesity, diastolic heart failure, peripheral neuropathy, and insulin -dependent DM type II  PAIN:  Are you having pain? Yes: NPRS scale: 10/10 Pain location: LUE, neck  Pain description: numbness Aggravating factors: constant Relieving factors: none  PRECAUTIONS: None  WEIGHT BEARING RESTRICTIONS: No  FALLS:  Has patient  fallen in last 6 months? No  PLOF: Independent  PATIENT GOALS: to get well  OBJECTIVE: (objective measures from initial evaluation unless otherwise dated)  DIAGNOSTIC FINDINGS:  MRI cervical spine 11/22/2023: 1. Motion degraded exam. 2. Multilevel cervical spondylosis with resultant mild spinal stenosis at C5-6 and C6-7. 3. Multifactorial degenerative changes with resultant multilevel foraminal narrowing as above. Notable findings include mild left C4 and C5 foraminal stenosis, mild to moderate bilateral C6 foraminal narrowing, with moderate left and mild right C7 foraminal stenosis.  PATIENT SURVEYS:  NDI:  NECK DISABILITY INDEX  Date: 03/08/24 Score  Pain intensity 2 = The pain is moderate at the moment  2. Personal care (washing, dressing, etc.) 2 = It is painful to look after myself and I am slow and careful  3. Lifting 4 =  I can only lift very light weights  4. Reading 4 =  I can hardly read at all because of severe pain in my neck  5. Headaches 3 = I have moderate headaches, which come frequently  6. Concentration 0 =  I can concentrate fully when I want to with no difficulty  7. Work 3 =  I cannot do my usual work  8. Driving 1 =  I can drive my car as long as I want with slight pain in my neck  9. Sleeping 1 = My sleep is slightly disturbed (less than 1 hr sleepless)  10. Recreation 5 = I can't do any recreation activities at all  Total 25/50   Minimum Detectable Change (90% confidence): 5 points or 10% points  COGNITION: Overall cognitive status: Within functional limits for tasks assessed  SENSATION: Impaired L C7  POSTURE: rounded shoulders, forward head, and increased thoracic kyphosis  PALPATION: Grossly TTP cervical paraspinals, UT, periscap musculature   CERVICAL ROM:   Active ROM A/PROM (deg) eval  Flexion 35*  Extension 20*  Right lateral flexion 20*  Left lateral flexion 27*  Right rotation 55*  Left rotation 37*   (Blank rows = not tested)  *=pain/symptoms  UPPER EXTREMITY ROM: limited bilateral UE mobility overhead with R more limited than L   Active ROM Right eval Left eval  Shoulder flexion    Shoulder extension    Shoulder abduction    Shoulder adduction    Shoulder extension    Shoulder internal rotation    Shoulder external rotation    Elbow flexion    Elbow extension    Wrist flexion    Wrist extension    Wrist ulnar deviation    Wrist radial deviation    Wrist pronation    Wrist supination     (Blank rows = not tested) *=pain/symptoms  UPPER EXTREMITY MMT:  MMT Right eval Left eval  Shoulder flexion 4+ 4+  Shoulder extension    Shoulder abduction 4+ 4+  Shoulder adduction    Shoulder extension    Shoulder internal rotation 5 5  Shoulder external rotation 4+ 4+  Middle trapezius    Lower trapezius    Elbow flexion 5 5  Elbow extension 5 4+  Wrist flexion 4+ 4  Wrist extension 5 4  Wrist ulnar deviation    Wrist radial deviation    Wrist pronation    Wrist supination    Grip strength 22 lbs 18 lbs   (Blank rows = not tested) *=pain/symptoms  CERVICAL SPECIAL TESTS:  Distraction test: Negative; no change in symptoms    TODAY'S TREATMENT:                                                                                                                              DATE:  04/12/2024: Seated UT stretch 1x 30 sec  Seated LS stretch 1x 30 sec  Seated row with yellow band x 10 Seated lat extension yellow band x 10  Table slides into flexion for increased shoulder mobility.  L UE reaching to R for increased thoracic mobility.    04/10/24 Seated cervical retraction x 10 Seated shoulder rolls x 5 R/L cervical lateral flexion for stretch Seated row with yellow band x 10 Self care: pt shown use of massage cane to neck and periscapular muscles; pt returned demo with cues.  Manual: SMT (light) to cervical paraspinals and Lt periscapular muscles in Rt sidelying   Rt sidelying, Lt overhead  stretch Hooklying with 2 pillows - trial of partial snow angels (pain in Rt pec/shoulder) x 5; touch down motion x 1  - requires assist to return to seated position Standing Rt low doorway stretch for biceps brachii and pec (not tolerated due to pain in knees) Seated UE stretch, reaching single arm back with hand on table  R/L x 15 sec each   03/31/24 Seated cervical retractions 2 x 10 Seated scap retractions 2x 10 Seated UT stretch 3 x 20 second holds Seated bilateral shoulder ER with scap retraction YTB 2 x 10 Seated horizontal abduction YTB 2 x 10 Manual STM to bilateral UT - not tolerated well Seated row YTB 2 x 15 Seated shoulder extension YTB 2 x 15  03/08/24  Seated cervical retractions 1 x 10 Seated scap retractions 1x 10  PATIENT EDUCATION:  Education details: Patient educated on exam findings, POC, scope of PT, HEP, relevant anatomy and biomechanics, and posture. 03/31/24: exercise mechanics, HEP Person educated: Patient Education method: Explanation, Demonstration, and Handouts Education comprehension: verbalized understanding, returned demonstration, verbal cues required, and tactile cues required  HOME EXERCISE PROGRAM: Access Code: [email or web address] URL: [email or web address] Date: 03/08/2024 Prepared by: Prentice Zaunegger  Exercises - Seated Cervical Retraction  - 3 x daily - 7 x weekly - 2 sets - 10 reps - Seated Scapular Retraction  - 3 x daily - 7 x weekly -  2 sets - 10 reps  ASSESSMENT:  CLINICAL IMPRESSION: Limited tolerance for exercise due to elevated pain levels upon arrival. She was able to perform very minimal stretching and exercises today prior to requesting to go down to the ED for shoulder and scap pain. Pt to be seen in aquatics on Monday. Patient will continue to benefit from physical therapy in order to improve function and reduce impairment.  No goals met yet.   OBJECTIVE IMPAIRMENTS: decreased activity tolerance, decreased endurance,  decreased mobility, decreased ROM, decreased strength, hypomobility, increased muscle spasms, impaired flexibility, impaired sensation, improper body mechanics, postural dysfunction, and pain.   ACTIVITY LIMITATIONS: carrying, lifting, bending, reach over head, hygiene/grooming, and caring for others  PARTICIPATION LIMITATIONS: meal prep, cleaning, laundry, shopping, community activity, and yard work  PERSONAL FACTORS: Hx of hypertension, hypothyroidism, OSA on CPAP, morbid obesity, diastolic heart failure, peripheral neuropathy, and insulin -dependent DM type II are also affecting patient's functional outcome.   REHAB POTENTIAL: Fair    CLINICAL DECISION MAKING: Unstable/unpredictable  EVALUATION COMPLEXITY: High   GOALS: Goals reviewed with patient? Yes  SHORT TERM GOALS: Target date: 04/05/2024    Patient will be independent with HEP in order to improve functional outcomes. Baseline: Goal status: INITIAL  2.  Patient will report at least 25% improvement in symptoms for improved quality of life. Baseline:  Goal status: INITIAL   LONG TERM GOALS: Target date: 04/19/2024    Patient will report at least 75% improvement in symptoms for improved quality of life. Baseline:  Goal status: INITIAL  2.  Patient will improve NDI score by at least 10 points in order to indicate improved tolerance to activity. Baseline: 25/50 Goal status: INITIAL  3.  Patient will demonstrate at least 10 degree improvement in cervical ROM in all restricted planes for improved ability to move head with housework. Baseline:  Goal status: INITIAL  4.  Patient will report centralized cervical symptoms < 2 /10 for improved ability to use LUE.  Baseline:  Goal status: INITIAL      PLAN:  PT FREQUENCY: 1-2x/week  PT DURATION: 6 weeks  PLANNED INTERVENTIONS: 97164- PT Re-evaluation, 97110-Therapeutic exercises, 97530- Therapeutic activity, [id number]- Neuromuscular re-education, 97535- Self Care,  97140- Manual therapy, [id number]- Gait training, 02239- Orthotic Fit/training, 04007- Canalith repositioning, 02886- Aquatic Therapy, [phone]- Splinting, 02402- Wound care (first 20 sq cm), 97598- Wound care (each additional 20 sq cm)Patient/Family education, Balance training, Stair training, Taping, Dry Needling, Joint mobilization, Joint manipulation, Spinal manipulation, Spinal mobilization, Scar mobilization, and DME instructions.  PLAN FOR NEXT SESSION: f/u with HEP, postural and UE strength, possibly manual for pain/mobility   Delon Aquas, PTA 04/12/24 12:25 PM [facility] [address] [place], [place], [address] Phone: [phone]   Fax:  [phone]   Date of referral: 12/14/23 Referring provider: Patel, Donika K, DO Referring diagnosis? M54.12 (ICD-10-CM) - Cervical radiculopathy Treatment diagnosis? (if different than referring diagnosis) Cervicalgia M54.2   What was this (referring dx) caused by? Ongoing Issue  Lysle of Condition: Chronic (continuous duration > 3 months)   Laterality: Both  Current Functional Measure Score: Neck Index 25/50  Objective measurements identify impairments when they are compared to normal values, the uninvolved extremity, and prior level of function.  [x]  Yes  []  No  Objective assessment of functional ability: Moderate functional limitations   Briefly describe symptoms: L arm and hand numbness  How did symptoms start: while watching TV  Average pain intensity:  Last 24 hours: 8/10  Past  week: 8/10  How often does the pt experience symptoms? Constantly  How much have the symptoms interfered with usual daily activities? Moderately  How has condition changed since care began at this facility? NA - initial visit  In general, how is the patients overall health? Fair   BACK PAIN (STarT Back Screening Tool) No

## 2024-04-12 NOTE — ED Notes (Signed)
 Pt stated that she wears her CPAP when she sleep. Pt asked for a CPAP. RT placed her on auto CPAP

## 2024-04-12 NOTE — ED Triage Notes (Signed)
 Pt caox4 c/o pain in R chest and R shoulder constant since yesterday.

## 2024-04-16 NOTE — Therapy (Signed)
 OUTPATIENT PHYSICAL THERAPY TREATMENT   Patient Name: Anita James MRN: [id number] DOB:1951-04-12, 73 y.o., female Today's Date: 04/18/2024  END OF SESSION:  PT End of Session - 04/17/24 1239     Visit Number 5    Number of Visits 12    Date for PT Re-Evaluation 04/19/24    Authorization Type [employer]ICARE    PT Start Time 1203    PT Stop Time 1242    PT Time Calculation (min) 39 min    Activity Tolerance Patient tolerated treatment well    Behavior During Therapy [facility] for tasks assessed/performed             Past Medical History:  Diagnosis Date   Abdominal pain    Anxiety    Carpal tunnel syndrome 06/14/2014   Chest pain    CHF (congestive heart failure) (HCC)    Chronic back pain    Chronic kidney disease 03/2013   failure due to meds   Diabetes mellitus type II, uncontrolled    Diverticulitis    Frequent headaches    Hiatal hernia    Hyperlipemia    Hypertension    Hypothyroidism    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   Neuropathy    Obesity    Pneumonia    Vitamin D  deficiency 06/05/2014   Past Surgical History:  Procedure Laterality Date   CESAREAN SECTION     COLONOSCOPY WITH PROPOFOL  N/A 07/01/2021   Procedure: COLONOSCOPY WITH PROPOFOL ;  Surgeon: Saintclair Jasper, MD;  Location: [facility];  Service: Gastroenterology;  Laterality: N/A;   KNEE SURGERY Left    THYROID SURGERY     goiter   Patient Active Problem List   Diagnosis Date Noted   Numbness and tingling of left upper extremity 11/22/2023   Peripheral neuropathy 11/22/2023   Insulin  dependent type 2 diabetes mellitus (HCC) 11/22/2023   Urinary incontinence 11/22/2023   Elevated lactic acid level 11/22/2023   Chronic gout 11/22/2023   Reactive airway disease 11/22/2023   Osteopenia of neck of left femur 05/09/2020   Chondrocalcinosis 04/29/2020   OA (osteoarthritis) of knee 04/15/2020   Right hand pain 04/15/2020   OSA on CPAP 05/19/2017   Morbid obesity due to excess  calories (HCC) 03/17/2017   Neuropathic pain 11/04/2016   Carpal tunnel syndrome 06/14/2014   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   Cervical radiculopathy at C8 06/05/2014   Hyperlipemia 09/12/2013   Chronic diastolic CHF (congestive heart failure) (HCC) 09/12/2013   Major depression 09/12/2013   Allergic rhinitis 06/22/2013   Chronic pain syndrome 05/31/2013   Gastroesophageal reflux disease without esophagitis 08/30/2012   Essential hypertension    Hypothyroidism    Type 2 diabetes, uncontrolled, with neuropathy     PCP: Carlie Rogelia LABOR, PA-C  REFERRING PROVIDER: Tobie Tonita POUR, DO  REFERRING DIAG: [phone] (ICD-10-CM) - Cervical radiculopathy  THERAPY DIAG:  Cervicalgia  Muscle weakness (generalized)  Abnormal posture  Other symptoms and signs involving the musculoskeletal system  Rationale for Evaluation and Treatment: Rehabilitation  ONSET DATE: Feb/March 2025  SUBJECTIVE:  SUBJECTIVE STATEMENT: Patient states she feels about the same.  Pt continues to have numbness in L middle finger and thumb.  Pt reports having a pool at her apartment complex.   Pt had a PT appt on 7/23 and went to the ER on 7/23.  Pt was having chest pain.  They did a CT angiography, chest x ray, and blood work.  Imaging indicated no acute abnormalities on chest x-ray or CT of the chest.  No evidence of pulmonary embolism.  Pt states everything looked fine and she received fluids. Pt had a nephrology appt on Thursday.     PERTINENT HISTORY:  Hx of hypertension, hypothyroidism, OSA on CPAP, morbid obesity, diastolic heart failure, peripheral neuropathy, and insulin -dependent DM type II  PAIN:  Are you having pain? Yes: NPRS scale: 8-10/10 Pain location: LUE, neck  Pain description:  numbness Aggravating factors: constant Relieving factors: none  PRECAUTIONS: None  WEIGHT BEARING RESTRICTIONS: No  FALLS:  Has patient fallen in last 6 months? No  PLOF: Independent  PATIENT GOALS: to get well  OBJECTIVE: (objective measures from initial evaluation unless otherwise dated)  DIAGNOSTIC FINDINGS:  MRI cervical spine 11/22/2023: 1. Motion degraded exam. 2. Multilevel cervical spondylosis with resultant mild spinal stenosis at C5-6 and C6-7. 3. Multifactorial degenerative changes with resultant multilevel foraminal narrowing as above. Notable findings include mild left C4 and C5 foraminal stenosis, mild to moderate bilateral C6 foraminal narrowing, with moderate left and mild right C7 foraminal stenosis.  PATIENT SURVEYS:  NDI:  NECK DISABILITY INDEX  Date: 03/08/24 Score  Pain intensity 2 = The pain is moderate at the moment  2. Personal care (washing, dressing, etc.) 2 = It is painful to look after myself and I am slow and careful  3. Lifting 4 =  I can only lift very light weights  4. Reading 4 =  I can hardly read at all because of severe pain in my neck  5. Headaches 3 = I have moderate headaches, which come frequently  6. Concentration 0 =  I can concentrate fully when I want to with no difficulty  7. Work 3 =  I cannot do my usual work  8. Driving 1 =  I can drive my car as long as I want with slight pain in my neck  9. Sleeping 1 = My sleep is slightly disturbed (less than 1 hr sleepless)  10. Recreation 5 = I can't do any recreation activities at all  Total 25/50   Minimum Detectable Change (90% confidence): 5 points or 10% points  COGNITION: Overall cognitive status: Within functional limits for tasks assessed  SENSATION: Impaired L C7  POSTURE: rounded shoulders, forward head, and increased thoracic kyphosis  PALPATION: Grossly TTP cervical paraspinals, UT, periscap musculature   CERVICAL ROM:   Active ROM A/PROM (deg) eval  Flexion 35*   Extension 20*  Right lateral flexion 20*  Left lateral flexion 27*  Right rotation 55*  Left rotation 37*   (Blank rows = not tested) *=pain/symptoms  UPPER EXTREMITY ROM: limited bilateral UE mobility overhead with R more limited than L   Active ROM Right eval Left eval  Shoulder flexion    Shoulder extension    Shoulder abduction    Shoulder adduction    Shoulder extension    Shoulder internal rotation    Shoulder external rotation    Elbow flexion    Elbow extension    Wrist flexion    Wrist extension    Wrist  ulnar deviation    Wrist radial deviation    Wrist pronation    Wrist supination     (Blank rows = not tested) *=pain/symptoms  UPPER EXTREMITY MMT:  MMT Right eval Left eval  Shoulder flexion 4+ 4+  Shoulder extension    Shoulder abduction 4+ 4+  Shoulder adduction    Shoulder extension    Shoulder internal rotation 5 5  Shoulder external rotation 4+ 4+  Middle trapezius    Lower trapezius    Elbow flexion 5 5  Elbow extension 5 4+  Wrist flexion 4+ 4  Wrist extension 5 4  Wrist ulnar deviation    Wrist radial deviation    Wrist pronation    Wrist supination    Grip strength 22 lbs 18 lbs   (Blank rows = not tested) *=pain/symptoms  CERVICAL SPECIAL TESTS:  Distraction test: Negative; no change in symptoms    TODAY'S TREATMENT:                                                                                                                              DATE:  04/17/24  Pt seen for aquatic therapy today.  Treatment took place in water 3.5 to approx 4 ft 4-6 in depth at the [facility] pool. Temp of water was 91.     Reviewed pt presentation and pain level.  PT used chair lift for pt to enter/exit pool.   Pt ambulated with yellow noodle x 3 laps Bilat shoulder ER/IR with arms at side Hz abd/add with aquatic DB's 2x10 Kickboard push/pull with retraction 2x10 Shoulder flexion/extension at approx 26ft 4-6 inches 2x12-15 Ambulated  with instruction in reciprocal arm swing   Pt requires buoyancy for support and to offload joints with strengthening exercises. Viscosity of the water is needed for resistance of strengthening; water current perturbations provides challenge to standing balance unsupported, requiring increased core activation.    04/12/2024: Seated UT stretch 1x 30 sec  Seated LS stretch 1x 30 sec  Seated row with yellow band x 10 Seated lat extension yellow band x 10  Table slides into flexion for increased shoulder mobility.  L UE reaching to R for increased thoracic mobility.    04/10/24 Seated cervical retraction x 10 Seated shoulder rolls x 5 R/L cervical lateral flexion for stretch Seated row with yellow band x 10 Self care: pt shown use of massage cane to neck and periscapular muscles; pt returned demo with cues.  Manual: SMT (light) to cervical paraspinals and Lt periscapular muscles in Rt sidelying   Rt sidelying, Lt overhead stretch Hooklying with 2 pillows - trial of partial snow angels (pain in Rt pec/shoulder) x 5; touch down motion x 1  - requires assist to return to seated position Standing Rt low doorway stretch for biceps brachii and pec (not tolerated due to pain in knees) Seated UE stretch, reaching single arm back with hand on table  R/L x 15 sec each  03/31/24 Seated cervical retractions 2 x 10 Seated scap retractions 2x 10 Seated UT stretch 3 x 20 second holds Seated bilateral shoulder ER with scap retraction YTB 2 x 10 Seated horizontal abduction YTB 2 x 10 Manual STM to bilateral UT - not tolerated well Seated row YTB 2 x 15 Seated shoulder extension YTB 2 x 15  03/08/24  Seated cervical retractions 1 x 10 Seated scap retractions 1x 10  PATIENT EDUCATION:  Education details:  aquatic properties and benefits, exercise form, POC, and relevant anatomy Person educated: Patient Education method: Explanation, Demonstration, verbal cues Education comprehension: verbalized  understanding, returned demonstration, verbal cues required  HOME EXERCISE PROGRAM: Access Code: [email or web address] URL: [email or web address] Date: 03/08/2024 Prepared by: Prentice Zaunegger  Exercises - Seated Cervical Retraction  - 3 x daily - 7 x weekly - 2 sets - 10 reps - Seated Scapular Retraction  - 3 x daily - 7 x weekly - 2 sets - 10 reps  ASSESSMENT:  CLINICAL IMPRESSION: Pt presents to PT for an aquatic treatment today.  She presents with high levels of pain and continues to have numbness in L hand.  Pt requested using the lift chair to enter pool and PT used the lift chair for pt to enter and exit pool.  PT educated pt in aquatic properties and rationale for aquatic therapy.  Pt performed aquatic exercises well with cuing and instruction in correct form.  She tolerated aquatic exercises well and is comfortable in the pool.  Pt responded well to Rx reporting improved pain to 0/10 in the water at the end of the Rx and 5-6/10 after Rx when out of the water.  Pt may benefit from increased focus on aquatic therapy at this time.    OBJECTIVE IMPAIRMENTS: decreased activity tolerance, decreased endurance, decreased mobility, decreased ROM, decreased strength, hypomobility, increased muscle spasms, impaired flexibility, impaired sensation, improper body mechanics, postural dysfunction, and pain.   ACTIVITY LIMITATIONS: carrying, lifting, bending, reach over head, hygiene/grooming, and caring for others  PARTICIPATION LIMITATIONS: meal prep, cleaning, laundry, shopping, community activity, and yard work  PERSONAL FACTORS: Hx of hypertension, hypothyroidism, OSA on CPAP, morbid obesity, diastolic heart failure, peripheral neuropathy, and insulin -dependent DM type II are also affecting patient's functional outcome.   REHAB POTENTIAL: Fair    CLINICAL DECISION MAKING: Unstable/unpredictable  EVALUATION COMPLEXITY: High   GOALS: Goals reviewed with patient? Yes  SHORT TERM GOALS:  Target date: 04/05/2024    Patient will be independent with HEP in order to improve functional outcomes. Baseline: Goal status: INITIAL  2.  Patient will report at least 25% improvement in symptoms for improved quality of life. Baseline:  Goal status: INITIAL   LONG TERM GOALS: Target date: 04/19/2024    Patient will report at least 75% improvement in symptoms for improved quality of life. Baseline:  Goal status: INITIAL  2.  Patient will improve NDI score by at least 10 points in order to indicate improved tolerance to activity. Baseline: 25/50 Goal status: INITIAL  3.  Patient will demonstrate at least 10 degree improvement in cervical ROM in all restricted planes for improved ability to move head with housework. Baseline:  Goal status: INITIAL  4.  Patient will report centralized cervical symptoms < 2 /10 for improved ability to use LUE.  Baseline:  Goal status: INITIAL      PLAN:  PT FREQUENCY: 1-2x/week  PT DURATION: 6 weeks  PLANNED INTERVENTIONS: 97164- PT Re-evaluation, 97110-Therapeutic exercises, 97530- Therapeutic activity, [id number]- Neuromuscular re-education, 97535- Self Care,  02859- Manual therapy, [id number]- Gait training, 02239- Orthotic Fit/training, 04007- Canalith repositioning, 02886- Aquatic Therapy, [id number]- Splinting, 02402- Wound care (first 20 sq cm), 97598- Wound care (each additional 20 sq cm)Patient/Family education, Balance training, Stair training, Taping, Dry Needling, Joint mobilization, Joint manipulation, Spinal manipulation, Spinal mobilization, Scar mobilization, and DME instructions.  PLAN FOR NEXT SESSION: f/u with HEP, postural and UE strength, possibly manual for pain/mobility.  Cont with aquatic and land based therapy   Leigh Minerva III PT, DPT 04/18/24 10:35 PM  [facility] [address] [place], [place], [address] Phone: [phone]   Fax:  [phone]   Date of referral:  12/14/23 Referring provider: Patel, Donika K, DO Referring diagnosis? M54.12 (ICD-10-CM) - Cervical radiculopathy Treatment diagnosis? (if different than referring diagnosis) Cervicalgia M54.2   What was this (referring dx) caused by? Ongoing Issue  Lysle of Condition: Chronic (continuous duration > 3 months)   Laterality: Both  Current Functional Measure Score: Neck Index 25/50  Objective measurements identify impairments when they are compared to normal values, the uninvolved extremity, and prior level of function.  [x]  Yes  []  No  Objective assessment of functional ability: Moderate functional limitations   Briefly describe symptoms: L arm and hand numbness  How did symptoms start: while watching TV  Average pain intensity:  Last 24 hours: 8/10  Past week: 8/10  How often does the pt experience symptoms? Constantly  How much have the symptoms interfered with usual daily activities? Moderately  How has condition changed since care began at this facility? NA - initial visit  In general, how is the patients overall health? Fair   BACK PAIN (STarT Back Screening Tool) No

## 2024-04-17 ENCOUNTER — Ambulatory Visit (HOSPITAL_BASED_OUTPATIENT_CLINIC_OR_DEPARTMENT_OTHER): Admitting: Physical Therapy

## 2024-04-17 DIAGNOSIS — M542 Cervicalgia: Secondary | ICD-10-CM

## 2024-04-17 DIAGNOSIS — R293 Abnormal posture: Secondary | ICD-10-CM

## 2024-04-17 DIAGNOSIS — M6281 Muscle weakness (generalized): Secondary | ICD-10-CM

## 2024-04-17 DIAGNOSIS — R29898 Other symptoms and signs involving the musculoskeletal system: Secondary | ICD-10-CM

## 2024-04-18 ENCOUNTER — Encounter (HOSPITAL_BASED_OUTPATIENT_CLINIC_OR_DEPARTMENT_OTHER): Payer: Self-pay | Admitting: Physical Therapy

## 2024-04-20 ENCOUNTER — Ambulatory Visit (HOSPITAL_BASED_OUTPATIENT_CLINIC_OR_DEPARTMENT_OTHER): Admitting: Physical Therapy

## 2024-04-20 ENCOUNTER — Encounter (HOSPITAL_BASED_OUTPATIENT_CLINIC_OR_DEPARTMENT_OTHER)

## 2024-04-25 ENCOUNTER — Encounter (HOSPITAL_BASED_OUTPATIENT_CLINIC_OR_DEPARTMENT_OTHER): Payer: Self-pay | Admitting: Physical Therapy

## 2024-04-25 ENCOUNTER — Ambulatory Visit (HOSPITAL_BASED_OUTPATIENT_CLINIC_OR_DEPARTMENT_OTHER): Attending: Neurology | Admitting: Physical Therapy

## 2024-04-25 DIAGNOSIS — R29898 Other symptoms and signs involving the musculoskeletal system: Secondary | ICD-10-CM | POA: Diagnosis present

## 2024-04-25 DIAGNOSIS — R293 Abnormal posture: Secondary | ICD-10-CM | POA: Insufficient documentation

## 2024-04-25 DIAGNOSIS — M6281 Muscle weakness (generalized): Secondary | ICD-10-CM | POA: Insufficient documentation

## 2024-04-25 DIAGNOSIS — M542 Cervicalgia: Secondary | ICD-10-CM | POA: Insufficient documentation

## 2024-04-25 NOTE — Therapy (Signed)
 OUTPATIENT PHYSICAL THERAPY TREATMENT   Patient Name: Anita James MRN: [id number] DOB:06-Oct-1950, 73 y.o., female Today's Date: 04/25/2024  Progress Note   Reporting Period 03/08/24 to 04/25/24   See note below for Objective Data and Assessment of Progress/Goals    END OF SESSION:  PT End of Session - 04/25/24 1305     Visit Number 6    Number of Visits 24    Date for PT Re-Evaluation 06/06/24    Authorization Type [employer]ICARE    Progress Note Due on Visit 16    PT Start Time 1303    PT Stop Time 1347    PT Time Calculation (min) 44 min    Activity Tolerance Patient tolerated treatment well    Behavior During Therapy [facility] for tasks assessed/performed             Past Medical History:  Diagnosis Date   Abdominal pain    Anxiety    Carpal tunnel syndrome 06/14/2014   Chest pain    CHF (congestive heart failure) (HCC)    Chronic back pain    Chronic kidney disease 03/2013   failure due to meds   Diabetes mellitus type II, uncontrolled    Diverticulitis    Frequent headaches    Hiatal hernia    Hyperlipemia    Hypertension    Hypothyroidism    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   Neuropathy    Obesity    Pneumonia    Vitamin D  deficiency 06/05/2014   Past Surgical History:  Procedure Laterality Date   CESAREAN SECTION     COLONOSCOPY WITH PROPOFOL  N/A 07/01/2021   Procedure: COLONOSCOPY WITH PROPOFOL ;  Surgeon: Saintclair Jasper, MD;  Location: [facility] ENDOSCOPY;  Service: Gastroenterology;  Laterality: N/A;   KNEE SURGERY Left    THYROID SURGERY     goiter   Patient Active Problem List   Diagnosis Date Noted   Numbness and tingling of left upper extremity 11/22/2023   Peripheral neuropathy 11/22/2023   Insulin  dependent type 2 diabetes mellitus (HCC) 11/22/2023   Urinary incontinence 11/22/2023   Elevated lactic acid level 11/22/2023   Chronic gout 11/22/2023   Reactive airway disease 11/22/2023   Osteopenia of neck of left femur 05/09/2020    Chondrocalcinosis 04/29/2020   OA (osteoarthritis) of knee 04/15/2020   Right hand pain 04/15/2020   OSA on CPAP 05/19/2017   Morbid obesity due to excess calories (HCC) 03/17/2017   Neuropathic pain 11/04/2016   Carpal tunnel syndrome 06/14/2014   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   Cervical radiculopathy at C8 06/05/2014   Hyperlipemia 09/12/2013   Chronic diastolic CHF (congestive heart failure) (HCC) 09/12/2013   Major depression 09/12/2013   Allergic rhinitis 06/22/2013   Chronic pain syndrome 05/31/2013   Gastroesophageal reflux disease without esophagitis 08/30/2012   Essential hypertension    Hypothyroidism    Type 2 diabetes, uncontrolled, with neuropathy     PCP: Carlie Rogelia LABOR, PA-C  REFERRING PROVIDER: Tobie Tonita POUR, DO  REFERRING DIAG: [phone] (ICD-10-CM) - Cervical radiculopathy  THERAPY DIAG:  Cervicalgia  Muscle weakness (generalized)  Abnormal posture  Other symptoms and signs involving the musculoskeletal system  Rationale for Evaluation and Treatment: Rehabilitation  ONSET DATE: Feb/March 2025  SUBJECTIVE:  SUBJECTIVE STATEMENT: Patient states symptoms worse when it rains. Doing pool exercises. Patient states 60% functional status. Can't feel thumb and middle finger. No symptoms in forearm or upper arm region like it was. Feels like she needs to stay a little longer in therapy.      PERTINENT HISTORY:  Hx of hypertension, hypothyroidism, OSA on CPAP, morbid obesity, diastolic heart failure, peripheral neuropathy, and insulin -dependent DM type II  PAIN:  Are you having pain? Yes: NPRS scale: 8-10/10 Pain location: LUE, neck  Pain description: numbness Aggravating factors: constant Relieving factors: none  PRECAUTIONS: None  WEIGHT BEARING  RESTRICTIONS: No  FALLS:  Has patient fallen in last 6 months? No  PLOF: Independent  PATIENT GOALS: to get well  OBJECTIVE: (objective measures from initial evaluation unless otherwise dated)  DIAGNOSTIC FINDINGS:  MRI cervical spine 11/22/2023: 1. Motion degraded exam. 2. Multilevel cervical spondylosis with resultant mild spinal stenosis at C5-6 and C6-7. 3. Multifactorial degenerative changes with resultant multilevel foraminal narrowing as above. Notable findings include mild left C4 and C5 foraminal stenosis, mild to moderate bilateral C6 foraminal narrowing, with moderate left and mild right C7 foraminal stenosis.  PATIENT SURVEYS:  NDI:  NECK DISABILITY INDEX  Date: 03/08/24 Score  Pain intensity 2 = The pain is moderate at the moment  2. Personal care (washing, dressing, etc.) 2 = It is painful to look after myself and I am slow and careful  3. Lifting 4 =  I can only lift very light weights  4. Reading 4 =  I can hardly read at all because of severe pain in my neck  5. Headaches 3 = I have moderate headaches, which come frequently  6. Concentration 0 =  I can concentrate fully when I want to with no difficulty  7. Work 3 =  I cannot do my usual work  8. Driving 1 =  I can drive my car as long as I want with slight pain in my neck  9. Sleeping 1 = My sleep is slightly disturbed (less than 1 hr sleepless)  10. Recreation 5 = I can't do any recreation activities at all  Total 25/50   Minimum Detectable Change (90% confidence): 5 points or 10% points  NECK DISABILITY INDEX  Date: 04/25/24 Score  Pain intensity 1 = The pain is very mild at the moment  2. Personal care (washing, dressing, etc.) 1 =  I can look after myself normally but it causes extra pain  3. Lifting 5 = I cannot lift or carry anything   4. Reading 4 =  I can hardly read at all because of severe pain in my neck  5. Headaches 5 = I have headaches almost all the time  6. Concentration 3 = I have a lot of  difficulty in concentrating when I want to  7. Work 3 =  I cannot do my usual work  8. Driving 1 =  I can drive my car as long as I want with slight pain in my neck  9. Sleeping 2 = My sleep is mildly disturbed (1-2 hrs sleepless)  10. Recreation 3 = I am able to engage in a few of my usual recreation activities because of pain in   my neck  Total 28/50   Minimum Detectable Change (90% confidence): 5 points or 10% points   COGNITION: Overall cognitive status: Within functional limits for tasks assessed  SENSATION: Impaired L C7  POSTURE: rounded shoulders, forward head, and increased thoracic  kyphosis  PALPATION: Grossly TTP cervical paraspinals, UT, periscap musculature   CERVICAL ROM:   Active ROM A/PROM (deg) eval AROM 04/25/24  Flexion 35* 35  Extension 20* 20  Right lateral flexion 20* 26  Left lateral flexion 27* 28  Right rotation 55* 53  Left rotation 37* 53   (Blank rows = not tested) *=pain/symptoms  UPPER EXTREMITY ROM: limited bilateral UE mobility overhead with R more limited than L ; 04/25/24: L thumb held in flexion, thumb pain with attempted PROM  Active ROM Right eval Left eval  Shoulder flexion    Shoulder extension    Shoulder abduction    Shoulder adduction    Shoulder extension    Shoulder internal rotation    Shoulder external rotation    Elbow flexion    Elbow extension    Wrist flexion    Wrist extension    Wrist ulnar deviation    Wrist radial deviation    Wrist pronation    Wrist supination     (Blank rows = not tested) *=pain/symptoms  UPPER EXTREMITY MMT:  MMT Right eval Left eval Right 04/25/24 Left 04/25/24  Shoulder flexion 4+ 4+ 4+ 4+  Shoulder extension      Shoulder abduction 4+ 4+ 4+ 4+  Shoulder adduction      Shoulder extension      Shoulder internal rotation 5 5 5 5   Shoulder external rotation 4+ 4+ 4+ 5  Middle trapezius      Lower trapezius      Elbow flexion 5 5 5 5   Elbow extension 5 4+ 5 5  Wrist flexion 4+ 4  5 5   Wrist extension 5 4 5 5   Wrist ulnar deviation      Wrist radial deviation      Wrist pronation      Wrist supination      Grip strength 22 lbs 18 lbs 32 lbs 31 lbs   (Blank rows = not tested) *=pain/symptoms  CERVICAL SPECIAL TESTS:  Distraction test: Negative; no change in symptoms    TODAY'S TREATMENT:                                                                                                                              DATE:  04/25/24 Reassessment  Discussion of POC    04/17/24  Pt seen for aquatic therapy today.  Treatment took place in water 3.5 to approx 4 ft 4-6 in depth at the [facility] pool. Temp of water was 91.     Reviewed pt presentation and pain level.  PT used chair lift for pt to enter/exit pool.   Pt ambulated with yellow noodle x 3 laps Bilat shoulder ER/IR with arms at side Hz abd/add with aquatic DB's 2x10 Kickboard push/pull with retraction 2x10 Shoulder flexion/extension at approx 65ft 4-6 inches 2x12-15 Ambulated with instruction in reciprocal arm swing   Pt requires buoyancy for support and to offload joints with strengthening  exercises. Viscosity of the water is needed for resistance of strengthening; water current perturbations provides challenge to standing balance unsupported, requiring increased core activation.    04/12/2024: Seated UT stretch 1x 30 sec  Seated LS stretch 1x 30 sec  Seated row with yellow band x 10 Seated lat extension yellow band x 10  Table slides into flexion for increased shoulder mobility.  L UE reaching to R for increased thoracic mobility.    04/10/24 Seated cervical retraction x 10 Seated shoulder rolls x 5 R/L cervical lateral flexion for stretch Seated row with yellow band x 10 Self care: pt shown use of massage cane to neck and periscapular muscles; pt returned demo with cues.  Manual: SMT (light) to cervical paraspinals and Lt periscapular muscles in Rt sidelying   Rt sidelying, Lt  overhead stretch Hooklying with 2 pillows - trial of partial snow angels (pain in Rt pec/shoulder) x 5; touch down motion x 1  - requires assist to return to seated position Standing Rt low doorway stretch for biceps brachii and pec (not tolerated due to pain in knees) Seated UE stretch, reaching single arm back with hand on table  R/L x 15 sec each   03/31/24 Seated cervical retractions 2 x 10 Seated scap retractions 2x 10 Seated UT stretch 3 x 20 second holds Seated bilateral shoulder ER with scap retraction YTB 2 x 10 Seated horizontal abduction YTB 2 x 10 Manual STM to bilateral UT - not tolerated well Seated row YTB 2 x 15 Seated shoulder extension YTB 2 x 15  03/08/24  Seated cervical retractions 1 x 10 Seated scap retractions 1x 10  PATIENT EDUCATION:  Education details:  aquatic properties and benefits, exercise form, POC, and relevant anatomy Person educated: Patient Education method: Explanation, Demonstration, verbal cues Education comprehension: verbalized understanding, returned demonstration, verbal cues required  HOME EXERCISE PROGRAM: Access Code: [email or web address] URL: [email or web address] Date: 03/08/2024 Prepared by: Prentice Laronda Lisby  Exercises - Seated Cervical Retraction  - 3 x daily - 7 x weekly - 2 sets - 10 reps - Seated Scapular Retraction  - 3 x daily - 7 x weekly - 2 sets - 10 reps  ASSESSMENT:  CLINICAL IMPRESSION: Patient has met 2/2 short term goals and 0/4 long term goals with ability to complete HEP and improvement in symptoms. Remaining goals not met due to continued deficits in symptoms, strength, ROM, activity tolerance, and functional mobility. Patient has made good progress toward remaining goals with equal c/sp ROM and improving bilateral UE strength. Patient with improvement in LUE radicular symptoms other than c/o thumb and middle finger numbness. Extending POC 1-2x/week for 6 weeks. Patient will continue to benefit from skilled  physical therapy in order to improve function and reduce impairment.    OBJECTIVE IMPAIRMENTS: decreased activity tolerance, decreased endurance, decreased mobility, decreased ROM, decreased strength, hypomobility, increased muscle spasms, impaired flexibility, impaired sensation, improper body mechanics, postural dysfunction, and pain.   ACTIVITY LIMITATIONS: carrying, lifting, bending, reach over head, hygiene/grooming, and caring for others  PARTICIPATION LIMITATIONS: meal prep, cleaning, laundry, shopping, community activity, and yard work  PERSONAL FACTORS: Hx of hypertension, hypothyroidism, OSA on CPAP, morbid obesity, diastolic heart failure, peripheral neuropathy, and insulin -dependent DM type II are also affecting patient's functional outcome.   REHAB POTENTIAL: Fair    CLINICAL DECISION MAKING: Unstable/unpredictable  EVALUATION COMPLEXITY: High   GOALS: Goals reviewed with patient? Yes  SHORT TERM GOALS: Target date: 04/05/2024    Patient will be independent with HEP in order  to improve functional outcomes. Baseline: Goal status: MET  2.  Patient will report at least 25% improvement in symptoms for improved quality of life. Baseline:  Goal status: MET   LONG TERM GOALS: Target date: 04/19/2024    Patient will report at least 75% improvement in symptoms for improved quality of life. Baseline:  Goal status: INITIAL  2.  Patient will improve NDI score by at least 10 points in order to indicate improved tolerance to activity. Baseline: 25/50 Goal status: INITIAL  3.  Patient will demonstrate at least 10 degree improvement in cervical ROM in all restricted planes for improved ability to move head with housework. Baseline:  Goal status: INITIAL  4.  Patient will report centralized cervical symptoms < 2 /10 for improved ability to use LUE.  Baseline:  Goal status: INITIAL      PLAN:  PT FREQUENCY: 1-2x/week  PT DURATION: 6 weeks  PLANNED INTERVENTIONS:  [id number]- PT Re-evaluation, 97110-Therapeutic exercises, 97530- Therapeutic activity, [id number]- Neuromuscular re-education, 97535- Self Care, [id number]- Manual therapy, [id number]- Gait training, [phone]- Orthotic Fit/training, [phone]- Canalith repositioning, [id number]- Aquatic Therapy, [phone]- Splinting, [phone]- Wound care (first 20 sq cm), 97598- Wound care (each additional 20 sq cm)Patient/Family education, Balance training, Stair training, Taping, Dry Needling, Joint mobilization, Joint manipulation, Spinal manipulation, Spinal mobilization, Scar mobilization, and DME instructions.  PLAN FOR NEXT SESSION: f/u with HEP, postural and UE strength, possibly manual for pain/mobility.  Cont with aquatic and land based therapy    Prentice GORMAN Stains, PT 04/25/2024, 1:53 PM   [facility] [address] [place], [place], [address] Phone: [phone]   Fax:  [phone]   Date of referral: 12/14/23 Referring provider: Patel, Donika K, DO Referring diagnosis? M54.12 (ICD-10-CM) - Cervical radiculopathy Treatment diagnosis? (if different than referring diagnosis) Cervicalgia M54.2   What was this (referring dx) caused by? Ongoing Issue  Lysle of Condition: Chronic (continuous duration > 3 months)   Laterality: Both  Current Functional Measure Score: Neck Index 25/50 04/25/24: 28/50  Objective measurements identify impairments when they are compared to normal values, the uninvolved extremity, and prior level of function.  [x]  Yes  []  No  Objective assessment of functional ability: Moderate functional limitations   Briefly describe symptoms: L arm and hand numbness  How did symptoms start: while watching TV  Average pain intensity:  Last 24 hours: 8/10  Past week: 8/10  How often does the pt experience symptoms? Constantly  How much have the symptoms interfered with usual daily activities? Moderately  How has condition changed since care began at this  facility? NA - initial visit  In general, how is the patients overall health? Fair   BACK PAIN (STarT Back Screening Tool) No

## 2024-04-26 ENCOUNTER — Other Ambulatory Visit: Payer: Self-pay

## 2024-04-26 DIAGNOSIS — Z1231 Encounter for screening mammogram for malignant neoplasm of breast: Secondary | ICD-10-CM

## 2024-04-27 ENCOUNTER — Ambulatory Visit (HOSPITAL_BASED_OUTPATIENT_CLINIC_OR_DEPARTMENT_OTHER): Admitting: Physical Therapy

## 2024-04-27 ENCOUNTER — Telehealth (HOSPITAL_BASED_OUTPATIENT_CLINIC_OR_DEPARTMENT_OTHER): Payer: Self-pay | Admitting: Physical Therapy

## 2024-04-27 NOTE — Telephone Encounter (Signed)
 Patient not seen for appointment. Patient stated she called this morning to cancel as she had another appointment.   1:32 PM, 04/27/24 Prentice CANDIE Stains PT, DPT Physical Therapist at [facility]

## 2024-05-02 ENCOUNTER — Encounter (HOSPITAL_BASED_OUTPATIENT_CLINIC_OR_DEPARTMENT_OTHER): Payer: Self-pay

## 2024-05-02 ENCOUNTER — Other Ambulatory Visit: Payer: Self-pay

## 2024-05-02 ENCOUNTER — Emergency Department (HOSPITAL_BASED_OUTPATIENT_CLINIC_OR_DEPARTMENT_OTHER)

## 2024-05-02 ENCOUNTER — Ambulatory Visit (HOSPITAL_BASED_OUTPATIENT_CLINIC_OR_DEPARTMENT_OTHER): Admitting: Physical Therapy

## 2024-05-02 ENCOUNTER — Emergency Department (HOSPITAL_BASED_OUTPATIENT_CLINIC_OR_DEPARTMENT_OTHER)
Admission: EM | Admit: 2024-05-02 | Discharge: 2024-05-02 | Disposition: A | Discharge status: Home / Self Care | Attending: Emergency Medicine | Admitting: Emergency Medicine

## 2024-05-02 DIAGNOSIS — Z7982 Long term (current) use of aspirin: Secondary | ICD-10-CM | POA: Diagnosis not present

## 2024-05-02 DIAGNOSIS — R1011 Right upper quadrant pain: Secondary | ICD-10-CM | POA: Diagnosis present

## 2024-05-02 DIAGNOSIS — Z794 Long term (current) use of insulin: Secondary | ICD-10-CM | POA: Diagnosis not present

## 2024-05-02 DIAGNOSIS — I509 Heart failure, unspecified: Secondary | ICD-10-CM | POA: Diagnosis not present

## 2024-05-02 DIAGNOSIS — J45909 Unspecified asthma, uncomplicated: Secondary | ICD-10-CM | POA: Diagnosis not present

## 2024-05-02 DIAGNOSIS — R109 Unspecified abdominal pain: Secondary | ICD-10-CM

## 2024-05-02 DIAGNOSIS — I110 Hypertensive heart disease with heart failure: Secondary | ICD-10-CM | POA: Insufficient documentation

## 2024-05-02 LAB — COMPREHENSIVE METABOLIC PANEL WITH GFR
ALT: 12 U/L (ref 0–44)
AST: 19 U/L (ref 15–41)
Albumin: 3.7 g/dL (ref 3.5–5.0)
Alkaline Phosphatase: 86 U/L (ref 38–126)
Anion gap: 11 (ref 5–15)
BUN: 23 mg/dL (ref 8–23)
CO2: 28 mmol/L (ref 22–32)
Calcium: 9.2 mg/dL (ref 8.9–10.3)
Chloride: 105 mmol/L (ref 98–111)
Creatinine, Ser: 1.16 mg/dL — ABNORMAL HIGH (ref 0.44–1.00)
GFR, Estimated: 50 mL/min — ABNORMAL LOW (ref >60)
Glucose, Bld: 102 mg/dL — ABNORMAL HIGH (ref 70–99)
Potassium: 3.8 mmol/L (ref 3.5–5.1)
Sodium: 144 mmol/L (ref 135–145)
Total Bilirubin: 0.4 mg/dL (ref 0.0–1.2)
Total Protein: 6.6 g/dL (ref 6.5–8.1)

## 2024-05-02 LAB — CBC WITH DIFFERENTIAL/PLATELET
Abs Immature Granulocytes: 0.01 K/uL (ref 0.00–0.07)
Basophils Absolute: 0 K/uL (ref 0.0–0.1)
Basophils Relative: 0 %
Eosinophils Absolute: 0.1 K/uL (ref 0.0–0.5)
Eosinophils Relative: 1 %
HCT: 40.3 % (ref 36.0–46.0)
Hemoglobin: 13.6 g/dL (ref 12.0–15.0)
Immature Granulocytes: 0 %
Lymphocytes Relative: 24 %
Lymphs Abs: 1.6 K/uL (ref 0.7–4.0)
MCH: 30.4 pg (ref 26.0–34.0)
MCHC: 33.7 g/dL (ref 30.0–36.0)
MCV: 90.2 fL (ref 80.0–100.0)
Monocytes Absolute: 0.8 K/uL (ref 0.1–1.0)
Monocytes Relative: 11 %
Neutro Abs: 4.4 K/uL (ref 1.7–7.7)
Neutrophils Relative %: 64 %
Platelets: 174 K/uL (ref 150–400)
RBC: 4.47 MIL/uL (ref 3.87–5.11)
RDW: 13.4 % (ref 11.5–15.5)
WBC: 6.9 K/uL (ref 4.0–10.5)
nRBC: 0 % (ref 0.0–0.2)

## 2024-05-02 LAB — LIPASE, BLOOD: Lipase: 37 U/L (ref 11–51)

## 2024-05-02 MED ORDER — SODIUM CHLORIDE 0.9 % IV BOLUS
1000.0000 mL | Freq: Once | INTRAVENOUS | Status: AC
  Administered 2024-05-02 (×2): 1000 mL via INTRAVENOUS

## 2024-05-02 MED ORDER — MORPHINE SULFATE (PF) 4 MG/ML IV SOLN
4.0000 mg | Freq: Once | INTRAVENOUS | Status: AC
  Administered 2024-05-02 (×2): 4 mg via INTRAVENOUS
  Filled 2024-05-02: qty 1

## 2024-05-02 MED ORDER — IOHEXOL 300 MG/ML  SOLN: 100.0000 mL | Freq: Once | INTRAMUSCULAR | Status: DC | PRN

## 2024-05-02 MED ORDER — KETOROLAC TROMETHAMINE 30 MG/ML IJ SOLN: 30.0000 mg | Freq: Once | INTRAMUSCULAR | Status: DC

## 2024-05-02 MED ORDER — ONDANSETRON HCL 4 MG/2ML IJ SOLN
4.0000 mg | Freq: Once | INTRAMUSCULAR | Status: AC
  Administered 2024-05-02 (×2): 4 mg via INTRAVENOUS
  Filled 2024-05-02: qty 2

## 2024-05-02 MED ORDER — HYDROCODONE-ACETAMINOPHEN 5-325 MG PO TABS: 2.0000 | ORAL_TABLET | ORAL | 0 refills | Status: AC | PRN

## 2024-05-02 NOTE — ED Provider Notes (Signed)
 [place] EMERGENCY DEPARTMENT AT [facility] Provider Note   CSN: [id number] Arrival date & time: 05/02/24  9948     Patient presents with: Flank Pain   Anita James is a 73 y.o. female.   Patient is a 73 year old female with past medical history of hypertension, GERD, chronic pain, sleep apnea, type 2 diabetes, chronic CHF.  Patient presenting today with complaints of pain in her right side.  She started earlier this evening with pain to her right flank and chest which moved to her right upper quadrant.  This began in the absence of any injury or trauma.  Pain is constant and worse with movement or palpation.  No alleviating factors.  No fevers or chills.  No vomiting or diarrhea.       Prior to Admission medications   Medication Sig Start Date End Date Taking? Authorizing Provider  Accu-Chek FastClix Lancets MISC USE AS DIRECTED TO CHECK BLOOD SUGAR THREE TIMES DAILY 11/07/19   Vicci Barnie NOVAK, MD  ACCU-CHEK GUIDE test strip USE AS DIRECTED TO CHECK BLOOD SUGAR THREE TIMES DAILY 10/04/19   Vicci Barnie NOVAK, MD  albuterol (ACCUNEB) 0.63 MG/3ML nebulizer solution Take 1 ampule by nebulization 3 (three) times daily as needed for wheezing or shortness of breath. 01/07/21   [provider]  albuterol (VENTOLIN HFA) 108 (90 Base) MCG/ACT inhaler Inhale 2 puffs into the lungs 3 (three) times daily as needed. 03/23/24   [provider]  allopurinol  (ZYLOPRIM ) 100 MG tablet Take 100 mg by mouth daily.    [provider]  aspirin  EC 81 MG tablet Take 81 mg by mouth daily. 11/20/16   [provider]  atorvastatin  (LIPITOR ) 40 MG tablet Take 40 mg by mouth at bedtime. 03/08/24   [provider]  atorvastatin  (LIPITOR ) 80 MG tablet Take 1 tablet (80 mg total) by mouth daily. 11/23/23   Pokhrel, Laxman, MD  Blood Glucose Monitoring Suppl (ACCU-CHEK AVIVA PLUS) w/Device KIT USE AS DIRECTED TO TEST TWICE DAILY 04/27/18   Vicci Barnie NOVAK, MD  Blood  Pressure Monitoring KIT Use daily. 12/24/23   Court Dorn PARAS, MD  Budeson-Glycopyrrol-Formoterol  (BREZTRI AEROSPHERE) 160-9-4.8 MCG/ACT AERO Inhale 2 puffs into the lungs in the morning and at bedtime.    [provider]  carvedilol  (COREG ) 25 MG tablet Take 25 mg by mouth 2 (two) times daily.    [provider]  cetirizine  (ZYRTEC ) 10 MG tablet Take 10 mg by mouth daily as needed for allergies.    [provider]  Continuous Glucose Sensor (FREESTYLE LIBRE 3 PLUS SENSOR) MISC SMARTSIG:Every 3 Days 04/04/24   [provider]  diclofenac  sodium (VOLTAREN ) 1 % GEL Apply 4 g topically 4 (four) times daily. Patient taking differently: Apply 4 g topically daily as needed (pain). 02/27/19   Emil Share, DO  empagliflozin (JARDIANCE) 10 MG TABS tablet Take 10 mg by mouth daily.    [provider]  feeding supplement, GLUCERNA SHAKE, (GLUCERNA SHAKE) LIQD Take 237 mLs by mouth every morning.    [provider]  fluticasone  (FLONASE ) 50 MCG/ACT nasal spray PLACE 2 SPRAYS IN EACH NOSTRIL DAILY AS NEEDED FOR ALLERGIES 07/21/19   Vicci Barnie NOVAK, MD  hydrochlorothiazide  (MICROZIDE ) 12.5 MG capsule Take 12.5 mg by mouth daily. 03/23/24   [provider]  insulin  aspart (NOVOLOG  FLEXPEN) 100 UNIT/ML FlexPen Inject 60 Units into the skin 3 (three) times daily with meals. Patient taking differently: Inject 20-45 Units into the skin 3 (three) times  daily after meals. 12/05/19   Vicci Barnie NOVAK, MD  Insulin  Glargine (LANTUS  SOLOSTAR) 100 UNIT/ML Solostar Pen Inject 84 Units into the skin daily. Patient taking differently: Inject 100 Units into the skin daily. 10/13/19   Vicci Barnie NOVAK, MD  Insulin  Pen Needle (TRUEPLUS PEN NEEDLES) 32G X 4 MM MISC Use to inject insulin . 10/13/18   Vicci Barnie NOVAK, MD  levothyroxine  (SYNTHROID ) 100 MCG tablet Take 1 tablet (100 mcg total) by mouth daily. 10/13/19   Vicci Barnie NOVAK, MD  lidocaine  (LIDODERM ) 5 % Place  1 patch onto the skin every 12 (twelve) hours. Remove & Discard patch within 12 hours or as directed by MD 10/19/23   Qiao, Jana W, MD  LIVALO 4 MG TABS Take 1 tablet by mouth at bedtime. 01/15/24   [provider]  NOVOFINE 32G X 6 MM MISC SMARTSIG:1 SUB-Q 4-5 Times Daily 01/22/20   [provider]  NYSTATIN  powder APPLY TOPICALLY 4 TIMES DAILY. Patient taking differently: Apply 1 Bottle topically 4 (four) times daily as needed (rash). 01/18/17   Marilyne Stephane DASEN, MD  omeprazole (PRILOSEC) 20 MG capsule Take 20 mg by mouth daily.    [provider]  pregabalin  (LYRICA ) 75 MG capsule Take 75 mg by mouth 2 (two) times daily.    [provider]  PREMARIN vaginal cream Place vaginally. 03/23/24   [provider]  torsemide  (DEMADEX ) 20 MG tablet Take 20 mg by mouth daily as needed (fluid). 05/09/18   [provider]  Zinc Acetate 50 MG CAPS Take 1 capsule by mouth in the morning and at bedtime.    [provider]    Allergies: Amoxicillin-pot clavulanate, Oxycodone , Sulfa antibiotics, Amoxicillin, Norvasc  [amlodipine  besylate], Penicillins, Sulfamethoxazole, and Tramadol     Review of Systems  All other systems reviewed and are negative.   Updated Vital Signs BP (!) 176/90 (BP Location: Right Arm)   Pulse 74   Temp 98 F (36.7 C) (Oral)   Resp (!) 22   SpO2 96%   Physical Exam Vitals and nursing note reviewed.  Constitutional:      General: She is not in acute distress.    Appearance: She is well-developed. She is not diaphoretic.  HENT:     Head: Normocephalic and atraumatic.  Cardiovascular:     Rate and Rhythm: Normal rate and regular rhythm.     Heart sounds: No murmur heard.    No friction rub. No gallop.  Pulmonary:     Effort: Pulmonary effort is normal. No respiratory distress.     Breath sounds: Normal breath sounds. No wheezing.  Abdominal:     General: Bowel sounds are normal. There is no distension.      Palpations: Abdomen is soft.     Tenderness: There is abdominal tenderness. There is no guarding or rebound.     Comments: There is tenderness noted to the right upper quadrant and right flank.  Musculoskeletal:        General: Normal range of motion.     Cervical back: Normal range of motion and neck supple.  Skin:    General: Skin is warm and dry.  Neurological:     General: No focal deficit present.     Mental Status: She is alert and oriented to person, place, and time.     (all labs ordered are listed, but only abnormal results are displayed) Labs Reviewed - No data to display  EKG: None  Radiology: No results found.  Procedures   Medications Ordered in the ED  ondansetron  (ZOFRAN ) injection 4 mg (has no administration in time range)  morphine  (PF) 4 MG/ML injection 4 mg (has no administration in time range)  ketorolac  (TORADOL ) 30 MG/ML injection 30 mg (has no administration in time range)  sodium chloride  0.9 % bolus 1,000 mL (has no administration in time range)                                    Medical Decision Making Amount and/or Complexity of Data Reviewed Labs: ordered. Radiology: ordered.  Risk Prescription drug management.   Patient is a 73 year old female presenting with right flank pain as described in the HPI.  Patient arrives here with stable vital signs and is afebrile.  Physical examination reveals tenderness under the the right breast and in the right flank, but abdomen is otherwise benign.  Laboratory studies obtained including CBC, CMP, and lipase, all of which are unremarkable.  CT scan of the chest, abdomen, and pelvis obtained showing no acute process and no etiology for her discomfort.  Due to short staffing in the radiology department, there was an extended wait on the final report of the CT scan.  Prior to this being reported, I was notified that the patient and family member were unhappy.  I returned to the room to explain to them  the reason for the delay in the CT scan.  At that point, the patient's daughter began berating me and made sarcastic remarks.  She wanted to know my name and the name of everyone on the floor so that she could report this incident to administration.  At this point, I informed the patient that she would be discharged.  I agreed to write a small quantity of pain medication and she is to follow-up with primary doctor if symptoms persist.  I feel as though acute pathology has been ruled out.     Final diagnoses:  None    ED Discharge Orders     None          Geroldine Berg, MD 05/02/24 [phone]

## 2024-05-02 NOTE — Discharge Instructions (Addendum)
 Take hydrocodone  as prescribed as needed for pain.  Follow-up with your primary doctor if symptoms or not improving in the next few days.

## 2024-05-02 NOTE — ED Notes (Signed)
 Pt asked about eating a drinking and was instructed that she could not until the CT results had resulted from the radiologist. She wanted test results in which it was explained as the nurse it was no in my scope of practice.

## 2024-05-02 NOTE — ED Triage Notes (Signed)
 Pt presents via POV c/o right sided flank pain and pain in back today. Reports pain worse with movement.

## 2024-05-02 NOTE — ED Notes (Signed)
 Patient daughter in room cussing at staff. Attempted to explain to family and patient that we were waiting on CT read results from radiology for disposition. Patient states she is in pain however refusing additional pain medication at this time.   Made multiple attempts to explain to family and patient regarding need for CT results to assist in diagnosing patient for disposition. Pt family argumentative, cussing at this RN and Delo MD regarding wait time.

## 2024-05-04 ENCOUNTER — Encounter (HOSPITAL_BASED_OUTPATIENT_CLINIC_OR_DEPARTMENT_OTHER): Payer: Self-pay | Admitting: Emergency Medicine

## 2024-05-04 ENCOUNTER — Emergency Department (HOSPITAL_BASED_OUTPATIENT_CLINIC_OR_DEPARTMENT_OTHER)

## 2024-05-04 ENCOUNTER — Emergency Department (HOSPITAL_BASED_OUTPATIENT_CLINIC_OR_DEPARTMENT_OTHER)
Admission: EM | Admit: 2024-05-04 | Discharge: 2024-05-04 | Disposition: A | Discharge status: Home / Self Care | Attending: Emergency Medicine | Admitting: Emergency Medicine

## 2024-05-04 ENCOUNTER — Ambulatory Visit (HOSPITAL_BASED_OUTPATIENT_CLINIC_OR_DEPARTMENT_OTHER): Admitting: Physical Therapy

## 2024-05-04 ENCOUNTER — Other Ambulatory Visit: Payer: Self-pay

## 2024-05-04 DIAGNOSIS — R1011 Right upper quadrant pain: Secondary | ICD-10-CM | POA: Diagnosis not present

## 2024-05-04 DIAGNOSIS — E1122 Type 2 diabetes mellitus with diabetic chronic kidney disease: Secondary | ICD-10-CM | POA: Insufficient documentation

## 2024-05-04 DIAGNOSIS — Z7982 Long term (current) use of aspirin: Secondary | ICD-10-CM | POA: Diagnosis not present

## 2024-05-04 DIAGNOSIS — R079 Chest pain, unspecified: Secondary | ICD-10-CM | POA: Diagnosis present

## 2024-05-04 DIAGNOSIS — Z7984 Long term (current) use of oral hypoglycemic drugs: Secondary | ICD-10-CM | POA: Insufficient documentation

## 2024-05-04 DIAGNOSIS — E1142 Type 2 diabetes mellitus with diabetic polyneuropathy: Secondary | ICD-10-CM | POA: Insufficient documentation

## 2024-05-04 DIAGNOSIS — Z794 Long term (current) use of insulin: Secondary | ICD-10-CM | POA: Diagnosis not present

## 2024-05-04 DIAGNOSIS — Z79899 Other long term (current) drug therapy: Secondary | ICD-10-CM | POA: Diagnosis not present

## 2024-05-04 DIAGNOSIS — I130 Hypertensive heart and chronic kidney disease with heart failure and stage 1 through stage 4 chronic kidney disease, or unspecified chronic kidney disease: Secondary | ICD-10-CM | POA: Diagnosis not present

## 2024-05-04 DIAGNOSIS — J45909 Unspecified asthma, uncomplicated: Secondary | ICD-10-CM | POA: Insufficient documentation

## 2024-05-04 DIAGNOSIS — E039 Hypothyroidism, unspecified: Secondary | ICD-10-CM | POA: Diagnosis not present

## 2024-05-04 DIAGNOSIS — N189 Chronic kidney disease, unspecified: Secondary | ICD-10-CM | POA: Diagnosis not present

## 2024-05-04 DIAGNOSIS — I5032 Chronic diastolic (congestive) heart failure: Secondary | ICD-10-CM | POA: Diagnosis not present

## 2024-05-04 LAB — CBC WITH DIFFERENTIAL/PLATELET
Abs Immature Granulocytes: 0.01 K/uL (ref 0.00–0.07)
Basophils Absolute: 0 K/uL (ref 0.0–0.1)
Basophils Relative: 0 %
Eosinophils Absolute: 0.1 K/uL (ref 0.0–0.5)
Eosinophils Relative: 1 %
HCT: 40.3 % (ref 36.0–46.0)
Hemoglobin: 13.6 g/dL (ref 12.0–15.0)
Immature Granulocytes: 0 %
Lymphocytes Relative: 19 %
Lymphs Abs: 1.3 K/uL (ref 0.7–4.0)
MCH: 30.2 pg (ref 26.0–34.0)
MCHC: 33.7 g/dL (ref 30.0–36.0)
MCV: 89.6 fL (ref 80.0–100.0)
Monocytes Absolute: 0.8 K/uL (ref 0.1–1.0)
Monocytes Relative: 11 %
Neutro Abs: 4.6 K/uL (ref 1.7–7.7)
Neutrophils Relative %: 69 %
Platelets: 174 K/uL (ref 150–400)
RBC: 4.5 MIL/uL (ref 3.87–5.11)
RDW: 13.5 % (ref 11.5–15.5)
WBC: 6.8 K/uL (ref 4.0–10.5)
nRBC: 0 % (ref 0.0–0.2)

## 2024-05-04 LAB — LIPASE, BLOOD: Lipase: 24 U/L (ref 11–51)

## 2024-05-04 LAB — COMPREHENSIVE METABOLIC PANEL WITH GFR
ALT: 14 U/L (ref 0–44)
AST: 17 U/L (ref 15–41)
Albumin: 3.9 g/dL (ref 3.5–5.0)
Alkaline Phosphatase: 84 U/L (ref 38–126)
Anion gap: 10 (ref 5–15)
BUN: 24 mg/dL — ABNORMAL HIGH (ref 8–23)
CO2: 30 mmol/L (ref 22–32)
Calcium: 9.4 mg/dL (ref 8.9–10.3)
Chloride: 102 mmol/L (ref 98–111)
Creatinine, Ser: 1.26 mg/dL — ABNORMAL HIGH (ref 0.44–1.00)
GFR, Estimated: 46 mL/min — ABNORMAL LOW (ref >60)
Glucose, Bld: 145 mg/dL — ABNORMAL HIGH (ref 70–99)
Potassium: 3.8 mmol/L (ref 3.5–5.1)
Sodium: 142 mmol/L (ref 135–145)
Total Bilirubin: 0.5 mg/dL (ref 0.0–1.2)
Total Protein: 6.8 g/dL (ref 6.5–8.1)

## 2024-05-04 LAB — TROPONIN T, HIGH SENSITIVITY
Troponin T High Sensitivity: 25 ng/L — ABNORMAL HIGH (ref 0–19)
Troponin T High Sensitivity: 23 ng/L — ABNORMAL HIGH (ref 0–19)

## 2024-05-04 LAB — D-DIMER, QUANTITATIVE: D-Dimer, Quant: 0.81 ug{FEU}/mL — ABNORMAL HIGH (ref 0.00–0.50)

## 2024-05-04 MED ORDER — HYDROMORPHONE HCL 1 MG/ML IJ SOLN
0.5000 mg | Freq: Once | INTRAMUSCULAR | Status: AC
  Administered 2024-05-04: 0.5 mg via INTRAVENOUS
  Filled 2024-05-04: qty 1

## 2024-05-04 MED ORDER — IOHEXOL 350 MG/ML SOLN
100.0000 mL | Freq: Once | INTRAVENOUS | Status: AC | PRN
Start: 2024-05-04 — End: 2024-05-04
  Administered 2024-05-04: 100 mL via INTRAVENOUS

## 2024-05-04 NOTE — Discharge Instructions (Addendum)
 We evaluated you for your chest pain. Your testing was reassuring and your CT scan didn't show any signs of a blood clot or other dangerous finding. Please follow up with your primary doctor for further care.

## 2024-05-04 NOTE — ED Triage Notes (Signed)
 Pt reports RT sided thoracic back pain that radiates under the RT breast, was seen in another ER for the same on Monday

## 2024-05-04 NOTE — ED Provider Notes (Signed)
 [place] [facility] Provider Note  CSN: [id number] Arrival date & time: 05/04/24 1717  Chief Complaint(s) Back Pain  HPI Anita James is a 73 y.o. female history of CHF, diabetes, hypertension, hyperlipidemia presenting to the emergency department with chest/back pain.  Patient reports right-sided back pain that radiates around her right chest.  Reports that the pain has been present for around a week.  No shortness of breath but reports pleuritic pain.  No recent travel or surgeries, no recent hospitalization.  Denies history of DVT or PE.  She reports some mild right upper quadrant pain.  No nausea or vomiting.  No diarrhea.  Has been taking Tylenol  without improvement.  Went to other ER earlier this week for testing but reports she was not told anything   Past Medical History Past Medical History:  Diagnosis Date   Abdominal pain    Anxiety    Carpal tunnel syndrome 06/14/2014   Chest pain    CHF (congestive heart failure) (HCC)    Chronic back pain    Chronic kidney disease 03/2013   failure due to meds   Diabetes mellitus type II, uncontrolled    Diverticulitis    Frequent headaches    Hiatal hernia    Hyperlipemia    Hypertension    Hypothyroidism    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   Neuropathy    Obesity    Pneumonia    Vitamin D  deficiency 06/05/2014   Patient Active Problem List   Diagnosis Date Noted   Numbness and tingling of left upper extremity 11/22/2023   Peripheral neuropathy 11/22/2023   Insulin  dependent type 2 diabetes mellitus (HCC) 11/22/2023   Urinary incontinence 11/22/2023   Elevated lactic acid level 11/22/2023   Chronic gout 11/22/2023   Reactive airway disease 11/22/2023   Osteopenia of neck of left femur 05/09/2020   Chondrocalcinosis 04/29/2020   OA (osteoarthritis) of knee 04/15/2020   Right hand pain 04/15/2020   OSA on CPAP 05/19/2017   Morbid obesity due to excess calories (HCC) 03/17/2017    Neuropathic pain 11/04/2016   Carpal tunnel syndrome 06/14/2014   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   Cervical radiculopathy at C8 06/05/2014   Hyperlipemia 09/12/2013   Chronic diastolic CHF (congestive heart failure) (HCC) 09/12/2013   Major depression 09/12/2013   Allergic rhinitis 06/22/2013   Chronic pain syndrome 05/31/2013   Gastroesophageal reflux disease without esophagitis 08/30/2012   Essential hypertension    Hypothyroidism    Type 2 diabetes, uncontrolled, with neuropathy    Home Medication(s) Prior to Admission medications   Medication Sig Start Date End Date Taking? Authorizing Provider  Accu-Chek FastClix Lancets MISC USE AS DIRECTED TO CHECK BLOOD SUGAR THREE TIMES DAILY 11/07/19   Vicci Barnie NOVAK, MD  ACCU-CHEK GUIDE test strip USE AS DIRECTED TO CHECK BLOOD SUGAR THREE TIMES DAILY 10/04/19   Vicci Barnie NOVAK, MD  albuterol (ACCUNEB) 0.63 MG/3ML nebulizer solution Take 1 ampule by nebulization 3 (three) times daily as needed for wheezing or shortness of breath. 01/07/21   [provider]  albuterol (VENTOLIN HFA) 108 (90 Base) MCG/ACT inhaler Inhale 2 puffs into the lungs 3 (three) times daily as needed. 03/23/24   [provider]  allopurinol  (ZYLOPRIM ) 100 MG tablet Take 100 mg by mouth daily.    [provider]  aspirin  EC 81 MG tablet Take 81 mg by mouth daily. 11/20/16   [provider]  atorvastatin  (LIPITOR ) 40 MG tablet Take 40  mg by mouth at bedtime. 03/08/24   [provider]  atorvastatin  (LIPITOR ) 80 MG tablet Take 1 tablet (80 mg total) by mouth daily. 11/23/23   Pokhrel, Laxman, MD  Blood Glucose Monitoring Suppl (ACCU-CHEK AVIVA PLUS) w/Device KIT USE AS DIRECTED TO TEST TWICE DAILY 04/27/18   Vicci Barnie NOVAK, MD  Blood Pressure Monitoring KIT Use daily. 12/24/23   Court Dorn PARAS, MD  Budeson-Glycopyrrol-Formoterol  (BREZTRI AEROSPHERE) 160-9-4.8 MCG/ACT AERO Inhale 2 puffs into the lungs in the morning and at  bedtime.    [provider]  carvedilol  (COREG ) 25 MG tablet Take 25 mg by mouth 2 (two) times daily.    [provider]  cetirizine  (ZYRTEC ) 10 MG tablet Take 10 mg by mouth daily as needed for allergies.    [provider]  Continuous Glucose Sensor (FREESTYLE LIBRE 3 PLUS SENSOR) MISC SMARTSIG:Every 3 Days 04/04/24   [provider]  diclofenac  sodium (VOLTAREN ) 1 % GEL Apply 4 g topically 4 (four) times daily. Patient taking differently: Apply 4 g topically daily as needed (pain). 02/27/19   Emil Share, DO  empagliflozin (JARDIANCE) 10 MG TABS tablet Take 10 mg by mouth daily.    [provider]  feeding supplement, GLUCERNA SHAKE, (GLUCERNA SHAKE) LIQD Take 237 mLs by mouth every morning.    [provider]  fluticasone  (FLONASE ) 50 MCG/ACT nasal spray PLACE 2 SPRAYS IN EACH NOSTRIL DAILY AS NEEDED FOR ALLERGIES 07/21/19   Vicci Barnie NOVAK, MD  hydrochlorothiazide  (MICROZIDE ) 12.5 MG capsule Take 12.5 mg by mouth daily. 03/23/24   [provider]  HYDROcodone -acetaminophen  (NORCO/VICODIN) 5-325 MG tablet Take 2 tablets by mouth every 4 (four) hours as needed. 05/02/24   Geroldine Berg, MD  insulin  aspart (NOVOLOG  FLEXPEN) 100 UNIT/ML FlexPen Inject 60 Units into the skin 3 (three) times daily with meals. Patient taking differently: Inject 20-45 Units into the skin 3 (three) times daily after meals. 12/05/19   Vicci Barnie NOVAK, MD  Insulin  Glargine (LANTUS  SOLOSTAR) 100 UNIT/ML Solostar Pen Inject 84 Units into the skin daily. Patient taking differently: Inject 100 Units into the skin daily. 10/13/19   Vicci Barnie NOVAK, MD  Insulin  Pen Needle (TRUEPLUS PEN NEEDLES) 32G X 4 MM MISC Use to inject insulin . 10/13/18   Vicci Barnie NOVAK, MD  levothyroxine  (SYNTHROID ) 100 MCG tablet Take 1 tablet (100 mcg total) by mouth daily. 10/13/19   Vicci Barnie NOVAK, MD  lidocaine  (LIDODERM ) 5 % Place 1 patch onto the skin every 12 (twelve) hours.  Remove & Discard patch within 12 hours or as directed by MD 10/19/23   Qiao, Jana W, MD  LIVALO 4 MG TABS Take 1 tablet by mouth at bedtime. 01/15/24   [provider]  NOVOFINE 32G X 6 MM MISC SMARTSIG:1 SUB-Q 4-5 Times Daily 01/22/20   [provider]  NYSTATIN  powder APPLY TOPICALLY 4 TIMES DAILY. Patient taking differently: Apply 1 Bottle topically 4 (four) times daily as needed (rash). 01/18/17   Marilyne Stephane DASEN, MD  omeprazole (PRILOSEC) 20 MG capsule Take 20 mg by mouth daily.    [provider]  pregabalin  (LYRICA ) 75 MG capsule Take 75 mg by mouth 2 (two) times daily.    [provider]  PREMARIN vaginal cream Place vaginally. 03/23/24   [provider]  torsemide  (DEMADEX ) 20 MG tablet Take 20 mg by mouth daily as needed (fluid). 05/09/18   [provider]  Zinc Acetate 50 MG CAPS Take 1 capsule by mouth in  the morning and at bedtime.    [provider]                                                                                                                                    Past Surgical History Past Surgical History:  Procedure Laterality Date   CESAREAN SECTION     COLONOSCOPY WITH PROPOFOL  N/A 07/01/2021   Procedure: COLONOSCOPY WITH PROPOFOL ;  Surgeon: Saintclair Jasper, MD;  Location: [facility];  Service: Gastroenterology;  Laterality: N/A;   KNEE SURGERY Left    THYROID SURGERY     goiter   Family History Family History  Problem Relation Age of Onset   Cancer Mother        cervical- mets   Breast cancer Mother    Cancer Father    Heart disease Father    Diabetes Father    Diabetes Sister    Breast cancer Sister    Heart disease Sister    Breast cancer Sister    Heart disease Sister    Breast cancer Sister    Breast cancer Sister    COPD Sister    Breast cancer Sister    Colon cancer Neg Hx    Esophageal cancer Neg Hx    Rectal cancer Neg Hx    Stomach cancer Neg Hx     Social History Social  History   Tobacco Use   Smoking status: Never   Smokeless tobacco: Never  Vaping Use   Vaping status: Never Used  Substance Use Topics   Alcohol use: No   Drug use: No   Allergies Amoxicillin-pot clavulanate, Oxycodone , Sulfa antibiotics, Amoxicillin, Norvasc  [amlodipine  besylate], Penicillins, Sulfamethoxazole, and Tramadol   Review of Systems Review of Systems  All other systems reviewed and are negative.   Physical Exam Vital Signs  I have reviewed the triage vital signs BP 104/63   Pulse 79   Temp 98.1 F (36.7 C) (Oral)   Resp 16   Ht 5' 5 (1.651 m)   Wt 136 kg   SpO2 91%   BMI 49.89 kg/m  Physical Exam Vitals and nursing note reviewed.  Constitutional:      General: She is not in acute distress.    Appearance: She is well-developed.  HENT:     Head: Normocephalic and atraumatic.     Mouth/Throat:     Mouth: Mucous membranes are moist.  Eyes:     Pupils: Pupils are equal, round, and reactive to light.  Cardiovascular:     Rate and Rhythm: Normal rate and regular rhythm.     Pulses:          Radial pulses are 2+ on the right side and 2+ on the left side.     Heart sounds: No murmur heard. Pulmonary:     Effort: Pulmonary effort is normal. No respiratory distress.     Breath sounds: Normal breath sounds.  Abdominal:  General: Abdomen is flat.     Palpations: Abdomen is soft.     Tenderness: There is no abdominal tenderness.  Musculoskeletal:        General: No tenderness.     Right lower leg: No edema.     Left lower leg: No edema.  Skin:    General: Skin is warm and dry.  Neurological:     General: No focal deficit present.     Mental Status: She is alert. Mental status is at baseline.  Psychiatric:        Mood and Affect: Mood normal.        Behavior: Behavior normal.     ED Results and Treatments Labs (all labs ordered are listed, but only abnormal results are displayed) Labs Reviewed  COMPREHENSIVE METABOLIC PANEL WITH GFR -  Abnormal; Notable for the following components:      Result Value   Glucose, Bld 145 (*)    BUN 24 (*)    Creatinine, Ser 1.26 (*)    GFR, Estimated 46 (*)    All other components within normal limits  D-DIMER, QUANTITATIVE - Abnormal; Notable for the following components:   D-Dimer, Quant 0.81 (*)    All other components within normal limits  TROPONIN T, HIGH SENSITIVITY - Abnormal; Notable for the following components:   Troponin T High Sensitivity 25 (*)    All other components within normal limits  TROPONIN T, HIGH SENSITIVITY - Abnormal; Notable for the following components:   Troponin T High Sensitivity 23 (*)    All other components within normal limits  CBC WITH DIFFERENTIAL/PLATELET  LIPASE, BLOOD                                                                                                                          Radiology CT Angio Chest PE W and/or Wo Contrast Result Date: 05/04/2024 CLINICAL DATA:  Positive D-dimer right-sided chest pain EXAM: CT ANGIOGRAPHY CHEST WITH CONTRAST TECHNIQUE: Multidetector CT imaging of the chest was performed using the standard protocol during bolus administration of intravenous contrast. Multiplanar CT image reconstructions and MIPs were obtained to evaluate the vascular anatomy. RADIATION DOSE REDUCTION: This exam was performed according to the departmental dose-optimization program which includes automated exposure control, adjustment of the mA and/or kV according to patient size and/or use of iterative reconstruction technique. CONTRAST:  OMNIPAQUE  IOHEXOL  350 MG/ML SOLN COMPARISON:  05/02/2024 FINDINGS: Cardiovascular: Thoracic aorta shows a normal branching pattern. No aneurysmal dilatation or dissection is noted. The pulmonary artery shows a normal branching pattern bilaterally. No intraluminal filling defect to suggest pulmonary embolism is noted. No cardiac enlargement is seen. No significant coronary calcifications are noted.  Mediastinum/Nodes: Thoracic inlet is within normal limits. No hilar or mediastinal adenopathy is noted. The esophagus as visualized is within normal limits. Lungs/Pleura: Lungs are well aerated bilaterally. No focal infiltrate or sizable effusion is seen. No sizable parenchymal nodule is noted. Upper Abdomen: Visualized upper abdomen shows no  acute abnormality. Musculoskeletal: Degenerative changes of the thoracic spine are seen. No rib abnormality is noted. Review of the MIP images confirms the above findings. IMPRESSION: No evidence of pulmonary embolism. No acute abnormality noted. Electronically Signed   By: Oneil Devonshire M.D.   On: 05/04/2024 21:27   DG Chest Portable 1 View Result Date: 05/04/2024 CLINICAL DATA:  chest pain EXAM: PORTABLE CHEST - 1 VIEW COMPARISON:  May 02, 2024, April 12, 2024 FINDINGS: Lower lung volumes. Biapical pleural thickening. No focal airspace consolidation, pleural effusion, or pneumothorax. No cardiomegaly. Aortic atherosclerosis. No acute fracture or destructive lesions. Multilevel thoracic osteophytosis. IMPRESSION: No acute cardiopulmonary abnormality. Electronically Signed   By: Rogelia Myers M.D.   On: 05/04/2024 19:50    Pertinent labs & imaging results that were available during my care of the patient were reviewed by me and considered in my medical decision making (see MDM for details).  Medications Ordered in ED Medications  HYDROmorphone  (DILAUDID ) injection 0.5 mg (0.5 mg Intravenous Given 05/04/24 1919)  iohexol  (OMNIPAQUE ) 350 MG/ML injection 100 mL (100 mLs Intravenous Contrast Given 05/04/24 2039)                                                                                                                                     Procedures Procedures  (including critical care time)  Medical Decision Making / ED Course   MDM:  73 year old presenting to the emergency department with thoracic chest/back pain.  Patient overall well-appearing,  vital signs reassuring.  Examination with no focal abnormality  Patient was in ER recently, earlier this week and had reassuring workup including CT scan of the chest abdomen pelvis which did not identify any specific cause of the patient's symptoms.  Given recurrent symptoms, will check repeat set of laboratory testing, given that she is having some chest discomfort we will also check troponin.  She does report the pain is pleuritic, not hypoxic or tachycardic but will check a D-dimer to rule out PE as I feel the risk of this is low.  Will obtain repeat chest x-ray but given reassuring CT scan earlier doubt this will identify any dangerous process such as pneumonia or pneumothorax.  Will reassess.  If workup today is also reassuring likely discharge with outpatient follow-up.  Clinical Course as of 05/04/24 2227  Thu May 04, 2024  2226 Workup overall reassuring.  CT angiography obtained given positive D-dimer which shows no evidence of PE.  Troponin minimally elevated but stable x 2.  Unclear cause of the patient's symptoms but given reassuring CT scan earlier this week as well as reassuring CT scan and laboratory testing today, feel patient is stable for discharge.  Recommended ongoing PMD follow-up. Will discharge patient to home. All questions answered. Patient comfortable with plan of discharge. Return precautions discussed with patient and specified on the after visit summary.  [WS]    Clinical Course User Index [WS] Damary Doland,  Elsie CROME, MD     Additional history obtained: -External records from outside source obtained and reviewed including: Chart review including previous notes, labs, imaging, consultation notes including previous ER visit   Lab Tests: -I ordered, reviewed, and interpreted labs.   The pertinent results include:   Labs Reviewed  COMPREHENSIVE METABOLIC PANEL WITH GFR - Abnormal; Notable for the following components:      Result Value   Glucose, Bld 145 (*)    BUN 24  (*)    Creatinine, Ser 1.26 (*)    GFR, Estimated 46 (*)    All other components within normal limits  D-DIMER, QUANTITATIVE - Abnormal; Notable for the following components:   D-Dimer, Quant 0.81 (*)    All other components within normal limits  TROPONIN T, HIGH SENSITIVITY - Abnormal; Notable for the following components:   Troponin T High Sensitivity 25 (*)    All other components within normal limits  TROPONIN T, HIGH SENSITIVITY - Abnormal; Notable for the following components:   Troponin T High Sensitivity 23 (*)    All other components within normal limits  CBC WITH DIFFERENTIAL/PLATELET  LIPASE, BLOOD    Notable for positive d-dimer, stable mild troponin elevation similar to prior visits   EKG   EKG Interpretation Date/Time:  Thursday May 04 2024 19:00:02 EDT Ventricular Rate:  73 PR Interval:  201 QRS Duration:  112 QT Interval:  441 QTC Calculation: 486 R Axis:   -26  Text Interpretation: Sinus rhythm Low voltage, precordial leads Abnormal R-wave progression, early transition Left ventricular hypertrophy Anterior Q waves, possibly due to LVH Confirmed by Francesca Elsie ([id number]) on 05/04/2024 7:32:26 PM         Imaging Studies ordered: I ordered imaging studies including CT chest On my interpretation imaging demonstrates no acute process I independently visualized and interpreted imaging. I agree with the radiologist interpretation   Medicines ordered and prescription drug management: Meds ordered this encounter  Medications   HYDROmorphone  (DILAUDID ) injection 0.5 mg   iohexol  (OMNIPAQUE ) 350 MG/ML injection 100 mL    -I have reviewed the patients home medicines and have made adjustments as needed   Cardiac Monitoring: The patient was maintained on a cardiac monitor.  I personally viewed and interpreted the cardiac monitored which showed an underlying rhythm of: NSR  Social Determinants of Health:  Diagnosis or treatment significantly limited by  social determinants of health: obesity   Reevaluation: After the interventions noted above, I reevaluated the patient and found that their symptoms have improved  Co morbidities that complicate the patient evaluation  Past Medical History:  Diagnosis Date   Abdominal pain    Anxiety    Carpal tunnel syndrome 06/14/2014   Chest pain    CHF (congestive heart failure) (HCC)    Chronic back pain    Chronic kidney disease 03/2013   failure due to meds   Diabetes mellitus type II, uncontrolled    Diverticulitis    Frequent headaches    Hiatal hernia    Hyperlipemia    Hypertension    Hypothyroidism    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   Neuropathy    Obesity    Pneumonia    Vitamin D  deficiency 06/05/2014      Dispostion: Disposition decision including need for hospitalization was considered, and patient discharged from emergency department.    Final Clinical Impression(s) / ED Diagnoses Final diagnoses:  Nonspecific chest pain     This chart was dictated using voice recognition  software.  Despite best efforts to proofread,  errors can occur which can change the documentation meaning.    Francesca Elsie CROME, MD 05/04/24 2227

## 2024-05-04 NOTE — ED Notes (Signed)
 Patient transported to CT

## 2024-05-04 NOTE — ED Notes (Signed)
 PT requested prescription for pain, [place] MD notified.

## 2024-05-23 ENCOUNTER — Ambulatory Visit (HOSPITAL_BASED_OUTPATIENT_CLINIC_OR_DEPARTMENT_OTHER): Admitting: Physical Therapy

## 2024-05-25 ENCOUNTER — Ambulatory Visit (HOSPITAL_BASED_OUTPATIENT_CLINIC_OR_DEPARTMENT_OTHER): Admitting: Physical Therapy

## 2024-05-30 ENCOUNTER — Ambulatory Visit (HOSPITAL_BASED_OUTPATIENT_CLINIC_OR_DEPARTMENT_OTHER): Attending: Neurology

## 2024-05-30 ENCOUNTER — Ambulatory Visit

## 2024-05-30 DIAGNOSIS — R29898 Other symptoms and signs involving the musculoskeletal system: Secondary | ICD-10-CM | POA: Insufficient documentation

## 2024-05-30 DIAGNOSIS — M6281 Muscle weakness (generalized): Secondary | ICD-10-CM | POA: Insufficient documentation

## 2024-05-30 DIAGNOSIS — M542 Cervicalgia: Secondary | ICD-10-CM | POA: Insufficient documentation

## 2024-05-30 DIAGNOSIS — R293 Abnormal posture: Secondary | ICD-10-CM | POA: Insufficient documentation

## 2024-06-01 ENCOUNTER — Telehealth (HOSPITAL_BASED_OUTPATIENT_CLINIC_OR_DEPARTMENT_OTHER): Payer: Self-pay | Admitting: Physical Therapy

## 2024-06-01 ENCOUNTER — Ambulatory Visit (HOSPITAL_BASED_OUTPATIENT_CLINIC_OR_DEPARTMENT_OTHER): Admitting: Physical Therapy

## 2024-06-01 NOTE — Telephone Encounter (Signed)
 Patient did not show to Physical therapy appointment on 05/30/24.  Called and left voicemail regarding missed appointment and requested patient return phone call to confirm next upcoming appointment for 06/01/24.    Delon Aquas, PTA 05/31/2024 [facility] [address] [place], [place], [address] Phone: [phone]   Fax:  [phone]

## 2024-06-01 NOTE — Telephone Encounter (Signed)
 Patient did not show to appointment today for aquatic therapy.   Called today and left message regarding missed appointment and requested return call to [phone].   Per attendance policy, if patient no-shows or late-cancels to 2 appointments, all future appointments (except next one) will be canceled.  Patient may only book one appointment at a time moving forward. If patient does not show for 3rd time, or late-cancels, the patient will be discharged and need a new referral from Dr to resume physical therapy.    Delon Aquas, PTA 06/01/24 4:04 PM [facility] [address] [place], [place], [address] Phone: [phone]   Fax:  [phone]

## 2024-06-06 ENCOUNTER — Telehealth (HOSPITAL_BASED_OUTPATIENT_CLINIC_OR_DEPARTMENT_OTHER): Payer: Self-pay

## 2024-06-06 ENCOUNTER — Ambulatory Visit (HOSPITAL_BASED_OUTPATIENT_CLINIC_OR_DEPARTMENT_OTHER)

## 2024-06-06 NOTE — Telephone Encounter (Signed)
 Called pt and left voicemail regarding missed appt. Due to this being pt's 3rd consecutive no-show, she was notified that we will d/c her from PT at this time. If she would like to continue PT she will need to obtain a new referral from MD.

## 2024-06-08 ENCOUNTER — Encounter (HOSPITAL_BASED_OUTPATIENT_CLINIC_OR_DEPARTMENT_OTHER): Payer: Self-pay | Admitting: Physical Therapy

## 2024-06-08 ENCOUNTER — Ambulatory Visit (HOSPITAL_BASED_OUTPATIENT_CLINIC_OR_DEPARTMENT_OTHER): Admitting: Physical Therapy

## 2024-06-08 NOTE — Therapy (Signed)
 [facility] at [facility] [address] [place], [place], [address] Phone: [phone]   Fax:  [phone]  Patient Details  Name: Anita James MRN: [id number] Date of Birth: March 24, 1951 Referring Provider:  No ref. provider found  Encounter Date: 06/08/2024   PHYSICAL THERAPY DISCHARGE SUMMARY  Visits from Start of Care: 6  Current functional level related to goals / functional outcomes: Unknown as patient has not returned.   Remaining deficits: Unknown as patient has not returned   Education / Equipment: HEP   Patient agrees to discharge. Patient goals were partially met. Patient is being discharged due to not returning since the last visit.   Prentice RAMAN Kyl Givler, PT 06/08/2024, 2:46 PM  [facility] Rehabilitation at [facility] [address] [place], [place], [address] Phone: [phone]   Fax:  [phone]

## 2024-06-13 ENCOUNTER — Encounter (HOSPITAL_BASED_OUTPATIENT_CLINIC_OR_DEPARTMENT_OTHER): Admitting: Physical Therapy

## 2024-06-14 ENCOUNTER — Emergency Department (HOSPITAL_BASED_OUTPATIENT_CLINIC_OR_DEPARTMENT_OTHER)

## 2024-06-14 ENCOUNTER — Inpatient Hospital Stay (HOSPITAL_BASED_OUTPATIENT_CLINIC_OR_DEPARTMENT_OTHER)
Admission: EM | Admit: 2024-06-14 | Discharge: 2024-06-20 | DRG: 291 | Disposition: A | Discharge status: Home / Self Care | Attending: Internal Medicine | Admitting: Internal Medicine

## 2024-06-14 ENCOUNTER — Encounter (HOSPITAL_BASED_OUTPATIENT_CLINIC_OR_DEPARTMENT_OTHER): Payer: Self-pay

## 2024-06-14 DIAGNOSIS — Z888 Allergy status to other drugs, medicaments and biological substances status: Secondary | ICD-10-CM

## 2024-06-14 DIAGNOSIS — I5033 Acute on chronic diastolic (congestive) heart failure: Principal | ICD-10-CM | POA: Diagnosis present

## 2024-06-14 DIAGNOSIS — E66813 Obesity, class 3: Secondary | ICD-10-CM | POA: Diagnosis present

## 2024-06-14 DIAGNOSIS — Z87892 Personal history of anaphylaxis: Secondary | ICD-10-CM

## 2024-06-14 DIAGNOSIS — N1831 Chronic kidney disease, stage 3a: Secondary | ICD-10-CM | POA: Diagnosis present

## 2024-06-14 DIAGNOSIS — I509 Heart failure, unspecified: Secondary | ICD-10-CM | POA: Diagnosis not present

## 2024-06-14 DIAGNOSIS — J45909 Unspecified asthma, uncomplicated: Secondary | ICD-10-CM | POA: Diagnosis present

## 2024-06-14 DIAGNOSIS — I10 Essential (primary) hypertension: Secondary | ICD-10-CM | POA: Diagnosis present

## 2024-06-14 DIAGNOSIS — Z803 Family history of malignant neoplasm of breast: Secondary | ICD-10-CM

## 2024-06-14 DIAGNOSIS — I130 Hypertensive heart and chronic kidney disease with heart failure and stage 1 through stage 4 chronic kidney disease, or unspecified chronic kidney disease: Principal | ICD-10-CM | POA: Diagnosis present

## 2024-06-14 DIAGNOSIS — I5032 Chronic diastolic (congestive) heart failure: Secondary | ICD-10-CM | POA: Diagnosis present

## 2024-06-14 DIAGNOSIS — Z885 Allergy status to narcotic agent status: Secondary | ICD-10-CM

## 2024-06-14 DIAGNOSIS — Z7982 Long term (current) use of aspirin: Secondary | ICD-10-CM

## 2024-06-14 DIAGNOSIS — Z880 Allergy status to penicillin: Secondary | ICD-10-CM

## 2024-06-14 DIAGNOSIS — E119 Type 2 diabetes mellitus without complications: Secondary | ICD-10-CM

## 2024-06-14 DIAGNOSIS — Z794 Long term (current) use of insulin: Secondary | ICD-10-CM

## 2024-06-14 DIAGNOSIS — G894 Chronic pain syndrome: Secondary | ICD-10-CM | POA: Diagnosis present

## 2024-06-14 DIAGNOSIS — Z881 Allergy status to other antibiotic agents status: Secondary | ICD-10-CM

## 2024-06-14 DIAGNOSIS — M25572 Pain in left ankle and joints of left foot: Secondary | ICD-10-CM | POA: Diagnosis present

## 2024-06-14 DIAGNOSIS — E785 Hyperlipidemia, unspecified: Secondary | ICD-10-CM | POA: Diagnosis present

## 2024-06-14 DIAGNOSIS — Z825 Family history of asthma and other chronic lower respiratory diseases: Secondary | ICD-10-CM

## 2024-06-14 DIAGNOSIS — Z8249 Family history of ischemic heart disease and other diseases of the circulatory system: Secondary | ICD-10-CM

## 2024-06-14 DIAGNOSIS — M62838 Other muscle spasm: Secondary | ICD-10-CM | POA: Diagnosis present

## 2024-06-14 DIAGNOSIS — Z7984 Long term (current) use of oral hypoglycemic drugs: Secondary | ICD-10-CM

## 2024-06-14 DIAGNOSIS — M7730 Calcaneal spur, unspecified foot: Secondary | ICD-10-CM | POA: Diagnosis present

## 2024-06-14 DIAGNOSIS — M25552 Pain in left hip: Secondary | ICD-10-CM | POA: Diagnosis present

## 2024-06-14 DIAGNOSIS — Z882 Allergy status to sulfonamides status: Secondary | ICD-10-CM

## 2024-06-14 DIAGNOSIS — Z79899 Other long term (current) drug therapy: Secondary | ICD-10-CM

## 2024-06-14 DIAGNOSIS — G4733 Obstructive sleep apnea (adult) (pediatric): Secondary | ICD-10-CM | POA: Diagnosis present

## 2024-06-14 DIAGNOSIS — E1122 Type 2 diabetes mellitus with diabetic chronic kidney disease: Secondary | ICD-10-CM | POA: Diagnosis present

## 2024-06-14 DIAGNOSIS — Z6841 Body Mass Index (BMI) 40.0 and over, adult: Secondary | ICD-10-CM

## 2024-06-14 DIAGNOSIS — M1A9XX0 Chronic gout, unspecified, without tophus (tophi): Secondary | ICD-10-CM | POA: Diagnosis present

## 2024-06-14 DIAGNOSIS — Z79890 Hormone replacement therapy (postmenopausal): Secondary | ICD-10-CM

## 2024-06-14 DIAGNOSIS — G629 Polyneuropathy, unspecified: Secondary | ICD-10-CM | POA: Diagnosis present

## 2024-06-14 DIAGNOSIS — E039 Hypothyroidism, unspecified: Secondary | ICD-10-CM | POA: Diagnosis present

## 2024-06-14 DIAGNOSIS — Z833 Family history of diabetes mellitus: Secondary | ICD-10-CM

## 2024-06-14 NOTE — ED Triage Notes (Addendum)
 Pt states she developed a sharp pain in her left leg that shoots up to her private area PTA

## 2024-06-15 ENCOUNTER — Emergency Department (HOSPITAL_BASED_OUTPATIENT_CLINIC_OR_DEPARTMENT_OTHER)

## 2024-06-15 ENCOUNTER — Ambulatory Visit (HOSPITAL_BASED_OUTPATIENT_CLINIC_OR_DEPARTMENT_OTHER): Admitting: Physical Therapy

## 2024-06-15 DIAGNOSIS — E1122 Type 2 diabetes mellitus with diabetic chronic kidney disease: Secondary | ICD-10-CM | POA: Diagnosis present

## 2024-06-15 DIAGNOSIS — E039 Hypothyroidism, unspecified: Secondary | ICD-10-CM | POA: Diagnosis present

## 2024-06-15 DIAGNOSIS — I130 Hypertensive heart and chronic kidney disease with heart failure and stage 1 through stage 4 chronic kidney disease, or unspecified chronic kidney disease: Secondary | ICD-10-CM | POA: Diagnosis present

## 2024-06-15 DIAGNOSIS — M62838 Other muscle spasm: Secondary | ICD-10-CM | POA: Diagnosis present

## 2024-06-15 DIAGNOSIS — Z8249 Family history of ischemic heart disease and other diseases of the circulatory system: Secondary | ICD-10-CM | POA: Diagnosis not present

## 2024-06-15 DIAGNOSIS — Z794 Long term (current) use of insulin: Secondary | ICD-10-CM | POA: Diagnosis not present

## 2024-06-15 DIAGNOSIS — Z833 Family history of diabetes mellitus: Secondary | ICD-10-CM | POA: Diagnosis not present

## 2024-06-15 DIAGNOSIS — G894 Chronic pain syndrome: Secondary | ICD-10-CM | POA: Diagnosis present

## 2024-06-15 DIAGNOSIS — E119 Type 2 diabetes mellitus without complications: Secondary | ICD-10-CM | POA: Diagnosis not present

## 2024-06-15 DIAGNOSIS — I5033 Acute on chronic diastolic (congestive) heart failure: Secondary | ICD-10-CM | POA: Diagnosis present

## 2024-06-15 DIAGNOSIS — Z803 Family history of malignant neoplasm of breast: Secondary | ICD-10-CM | POA: Diagnosis not present

## 2024-06-15 DIAGNOSIS — I509 Heart failure, unspecified: Secondary | ICD-10-CM | POA: Diagnosis present

## 2024-06-15 DIAGNOSIS — Z7984 Long term (current) use of oral hypoglycemic drugs: Secondary | ICD-10-CM | POA: Diagnosis not present

## 2024-06-15 DIAGNOSIS — G4733 Obstructive sleep apnea (adult) (pediatric): Secondary | ICD-10-CM | POA: Diagnosis present

## 2024-06-15 DIAGNOSIS — Z79899 Other long term (current) drug therapy: Secondary | ICD-10-CM | POA: Diagnosis not present

## 2024-06-15 DIAGNOSIS — E66813 Obesity, class 3: Secondary | ICD-10-CM | POA: Diagnosis present

## 2024-06-15 DIAGNOSIS — Z79890 Hormone replacement therapy (postmenopausal): Secondary | ICD-10-CM | POA: Diagnosis not present

## 2024-06-15 DIAGNOSIS — N1831 Chronic kidney disease, stage 3a: Secondary | ICD-10-CM | POA: Diagnosis present

## 2024-06-15 DIAGNOSIS — I5032 Chronic diastolic (congestive) heart failure: Secondary | ICD-10-CM | POA: Diagnosis not present

## 2024-06-15 DIAGNOSIS — J45909 Unspecified asthma, uncomplicated: Secondary | ICD-10-CM | POA: Diagnosis present

## 2024-06-15 DIAGNOSIS — E785 Hyperlipidemia, unspecified: Secondary | ICD-10-CM | POA: Diagnosis present

## 2024-06-15 DIAGNOSIS — Z7982 Long term (current) use of aspirin: Secondary | ICD-10-CM | POA: Diagnosis not present

## 2024-06-15 DIAGNOSIS — M25572 Pain in left ankle and joints of left foot: Secondary | ICD-10-CM | POA: Diagnosis present

## 2024-06-15 DIAGNOSIS — I5031 Acute diastolic (congestive) heart failure: Secondary | ICD-10-CM | POA: Diagnosis not present

## 2024-06-15 DIAGNOSIS — Z825 Family history of asthma and other chronic lower respiratory diseases: Secondary | ICD-10-CM | POA: Diagnosis not present

## 2024-06-15 DIAGNOSIS — M1A9XX0 Chronic gout, unspecified, without tophus (tophi): Secondary | ICD-10-CM | POA: Diagnosis present

## 2024-06-15 DIAGNOSIS — Z6841 Body Mass Index (BMI) 40.0 and over, adult: Secondary | ICD-10-CM | POA: Diagnosis not present

## 2024-06-15 DIAGNOSIS — M25552 Pain in left hip: Secondary | ICD-10-CM | POA: Diagnosis not present

## 2024-06-15 DIAGNOSIS — M7730 Calcaneal spur, unspecified foot: Secondary | ICD-10-CM | POA: Diagnosis present

## 2024-06-15 LAB — CBC
HCT: 42.1 % (ref 36.0–46.0)
Hemoglobin: 13.9 g/dL (ref 12.0–15.0)
MCH: 30.5 pg (ref 26.0–34.0)
MCHC: 33 g/dL (ref 30.0–36.0)
MCV: 92.3 fL (ref 80.0–100.0)
Platelets: 168 K/uL (ref 150–400)
RBC: 4.56 MIL/uL (ref 3.87–5.11)
RDW: 13.8 % (ref 11.5–15.5)
WBC: 7.1 K/uL (ref 4.0–10.5)
nRBC: 0 % (ref 0.0–0.2)

## 2024-06-15 LAB — COMPREHENSIVE METABOLIC PANEL WITH GFR
ALT: 13 U/L (ref 0–44)
AST: 20 U/L (ref 15–41)
Albumin: 3.5 g/dL (ref 3.5–5.0)
Alkaline Phosphatase: 77 U/L (ref 38–126)
Anion gap: 12 (ref 5–15)
BUN: 21 mg/dL (ref 8–23)
CO2: 25 mmol/L (ref 22–32)
Calcium: 8.7 mg/dL — ABNORMAL LOW (ref 8.9–10.3)
Chloride: 103 mmol/L (ref 98–111)
Creatinine, Ser: 1.17 mg/dL — ABNORMAL HIGH (ref 0.44–1.00)
GFR, Estimated: 50 mL/min — ABNORMAL LOW (ref >60)
Glucose, Bld: 224 mg/dL — ABNORMAL HIGH (ref 70–99)
Potassium: 3.8 mmol/L (ref 3.5–5.1)
Sodium: 140 mmol/L (ref 135–145)
Total Bilirubin: 0.4 mg/dL (ref 0.0–1.2)
Total Protein: 6 g/dL — ABNORMAL LOW (ref 6.5–8.1)

## 2024-06-15 LAB — CBG MONITORING, ED
Glucose-Capillary: 186 mg/dL — ABNORMAL HIGH (ref 70–99)
Glucose-Capillary: 173 mg/dL — ABNORMAL HIGH (ref 70–99)

## 2024-06-15 LAB — LACTIC ACID, PLASMA: Lactic Acid, Venous: 0.9 mmol/L (ref 0.5–1.9)

## 2024-06-15 LAB — CBC WITH DIFFERENTIAL/PLATELET
Abs Immature Granulocytes: 0.03 K/uL (ref 0.00–0.07)
Basophils Absolute: 0 K/uL (ref 0.0–0.1)
Basophils Relative: 0 %
Eosinophils Absolute: 0.1 K/uL (ref 0.0–0.5)
Eosinophils Relative: 1 %
HCT: 37.6 % (ref 36.0–46.0)
Hemoglobin: 13.1 g/dL (ref 12.0–15.0)
Immature Granulocytes: 0 %
Lymphocytes Relative: 17 %
Lymphs Abs: 1.3 K/uL (ref 0.7–4.0)
MCH: 31.2 pg (ref 26.0–34.0)
MCHC: 34.8 g/dL (ref 30.0–36.0)
MCV: 89.5 fL (ref 80.0–100.0)
Monocytes Absolute: 0.8 K/uL (ref 0.1–1.0)
Monocytes Relative: 11 %
Neutro Abs: 5.1 K/uL (ref 1.7–7.7)
Neutrophils Relative %: 71 %
Platelets: 182 K/uL (ref 150–400)
RBC: 4.2 MIL/uL (ref 3.87–5.11)
RDW: 13.6 % (ref 11.5–15.5)
WBC: 7.2 K/uL (ref 4.0–10.5)
nRBC: 0 % (ref 0.0–0.2)

## 2024-06-15 LAB — TROPONIN T, HIGH SENSITIVITY
Troponin T High Sensitivity: 28 ng/L — ABNORMAL HIGH (ref 0–19)
Troponin T High Sensitivity: 29 ng/L — ABNORMAL HIGH (ref 0–19)

## 2024-06-15 LAB — GLUCOSE, CAPILLARY
Glucose-Capillary: 217 mg/dL — ABNORMAL HIGH (ref 70–99)
Glucose-Capillary: 203 mg/dL — ABNORMAL HIGH (ref 70–99)

## 2024-06-15 LAB — PRO BRAIN NATRIURETIC PEPTIDE: Pro Brain Natriuretic Peptide: 356 pg/mL — ABNORMAL HIGH (ref <300.0)

## 2024-06-15 LAB — HEMOGLOBIN A1C
Hgb A1c MFr Bld: 7.1 % — ABNORMAL HIGH (ref 4.8–5.6)
Mean Plasma Glucose: 157.07 mg/dL

## 2024-06-15 LAB — CK: Total CK: 119 U/L (ref 38–234)

## 2024-06-15 LAB — D-DIMER, QUANTITATIVE: D-Dimer, Quant: 1.18 ug{FEU}/mL — ABNORMAL HIGH (ref 0.00–0.50)

## 2024-06-15 MED ORDER — INSULIN ASPART 100 UNIT/ML IJ SOLN
0.0000 [IU] | Freq: Three times a day (TID) | INTRAMUSCULAR | Status: DC
  Administered 2024-06-15: 7 [IU] via SUBCUTANEOUS
  Administered 2024-06-15 (×2): 4 [IU] via SUBCUTANEOUS
  Administered 2024-06-16: 7 [IU] via SUBCUTANEOUS
  Administered 2024-06-16: 11 [IU] via SUBCUTANEOUS
  Administered 2024-06-16 – 2024-06-17 (×2): 4 [IU] via SUBCUTANEOUS
  Administered 2024-06-17: 7 [IU] via SUBCUTANEOUS
  Administered 2024-06-17: 4 [IU] via SUBCUTANEOUS
  Administered 2024-06-18 (×2): 7 [IU] via SUBCUTANEOUS
  Administered 2024-06-18: 4 [IU] via SUBCUTANEOUS
  Administered 2024-06-19: 11 [IU] via SUBCUTANEOUS
  Administered 2024-06-19: 15 [IU] via SUBCUTANEOUS
  Administered 2024-06-19: 3 [IU] via SUBCUTANEOUS
  Administered 2024-06-20 (×2): 11 [IU] via SUBCUTANEOUS

## 2024-06-15 MED ORDER — FUROSEMIDE 10 MG/ML IJ SOLN
20.0000 mg | Freq: Once | INTRAMUSCULAR | Status: AC
  Administered 2024-06-15: 20 mg via INTRAVENOUS
  Filled 2024-06-15: qty 2

## 2024-06-15 MED ORDER — IOHEXOL 350 MG/ML SOLN
100.0000 mL | Freq: Once | INTRAVENOUS | Status: AC | PRN
  Administered 2024-06-15: 75 mL via INTRAVENOUS

## 2024-06-15 MED ORDER — LEVOTHYROXINE SODIUM 100 MCG PO TABS
100.0000 ug | ORAL_TABLET | Freq: Every day | ORAL | Status: DC
  Administered 2024-06-15: 100 ug via ORAL
  Filled 2024-06-15: qty 1

## 2024-06-15 MED ORDER — BUDESON-GLYCOPYRROL-FORMOTEROL 160-9-4.8 MCG/ACT IN AERO
2.0000 | INHALATION_SPRAY | Freq: Two times a day (BID) | RESPIRATORY_TRACT | Status: DC
  Administered 2024-06-15 – 2024-06-20 (×10): 2 via RESPIRATORY_TRACT
  Filled 2024-06-15: qty 5.9

## 2024-06-15 MED ORDER — PANTOPRAZOLE SODIUM 40 MG PO TBEC
40.0000 mg | DELAYED_RELEASE_TABLET | Freq: Every day | ORAL | Status: DC
  Administered 2024-06-15 – 2024-06-20 (×6): 40 mg via ORAL
  Filled 2024-06-15 (×6): qty 1

## 2024-06-15 MED ORDER — EMPAGLIFLOZIN 10 MG PO TABS
10.0000 mg | ORAL_TABLET | Freq: Every day | ORAL | Status: DC
  Administered 2024-06-16 – 2024-06-20 (×5): 10 mg via ORAL
  Filled 2024-06-15 (×5): qty 1

## 2024-06-15 MED ORDER — HYDROMORPHONE HCL 1 MG/ML IJ SOLN
0.5000 mg | INTRAMUSCULAR | Status: DC | PRN
  Administered 2024-06-15: 1 mg via INTRAVENOUS
  Filled 2024-06-15: qty 1

## 2024-06-15 MED ORDER — HYDROMORPHONE HCL 1 MG/ML IJ SOLN
0.5000 mg | Freq: Once | INTRAMUSCULAR | Status: AC
  Administered 2024-06-15: 0.5 mg via INTRAVENOUS
  Filled 2024-06-15: qty 1

## 2024-06-15 MED ORDER — INSULIN GLARGINE 100 UNIT/ML ~~LOC~~ SOLN
40.0000 [IU] | Freq: Every day | SUBCUTANEOUS | Status: DC
  Administered 2024-06-16: 40 [IU] via SUBCUTANEOUS
  Filled 2024-06-15: qty 0.4

## 2024-06-15 MED ORDER — FUROSEMIDE 10 MG/ML IJ SOLN
40.0000 mg | Freq: Once | INTRAMUSCULAR | Status: AC
  Administered 2024-06-15: 40 mg via INTRAVENOUS
  Filled 2024-06-15: qty 4

## 2024-06-15 MED ORDER — ONDANSETRON HCL 4 MG/2ML IJ SOLN
4.0000 mg | Freq: Once | INTRAMUSCULAR | Status: AC
Start: 2024-06-15 — End: 2024-06-15
  Administered 2024-06-15: 4 mg via INTRAVENOUS
  Filled 2024-06-15: qty 2

## 2024-06-15 MED ORDER — ENOXAPARIN SODIUM 80 MG/0.8ML IJ SOSY
75.0000 mg | PREFILLED_SYRINGE | INTRAMUSCULAR | Status: DC
  Administered 2024-06-15 – 2024-06-19 (×5): 75 mg via SUBCUTANEOUS
  Filled 2024-06-15 (×5): qty 0.8

## 2024-06-15 MED ORDER — HYDROCODONE-ACETAMINOPHEN 5-325 MG PO TABS
2.0000 | ORAL_TABLET | ORAL | Status: DC | PRN
  Administered 2024-06-15 (×4): 2 via ORAL
  Filled 2024-06-15 (×4): qty 2

## 2024-06-15 MED ORDER — INSULIN ASPART 100 UNIT/ML IJ SOLN
0.0000 [IU] | Freq: Every day | INTRAMUSCULAR | Status: DC
  Administered 2024-06-15: 3 [IU] via SUBCUTANEOUS
  Administered 2024-06-17: 2 [IU] via SUBCUTANEOUS

## 2024-06-15 MED ORDER — PREGABALIN 25 MG PO CAPS
75.0000 mg | ORAL_CAPSULE | Freq: Two times a day (BID) | ORAL | Status: DC
Start: 2024-06-15 — End: 2024-06-17
  Administered 2024-06-15 – 2024-06-17 (×5): 75 mg via ORAL
  Filled 2024-06-15 (×3): qty 3
  Filled 2024-06-15: qty 1
  Filled 2024-06-15: qty 3

## 2024-06-15 MED ORDER — FUROSEMIDE 10 MG/ML IJ SOLN: 40.0000 mg | Freq: Once | INTRAMUSCULAR | Status: DC

## 2024-06-15 MED ORDER — ASPIRIN 81 MG PO TBEC
81.0000 mg | DELAYED_RELEASE_TABLET | Freq: Every day | ORAL | Status: DC
  Administered 2024-06-15 – 2024-06-20 (×6): 81 mg via ORAL
  Filled 2024-06-15 (×6): qty 1

## 2024-06-15 MED ORDER — HYDROMORPHONE HCL 1 MG/ML IJ SOLN
1.0000 mg | Freq: Once | INTRAMUSCULAR | Status: AC
  Administered 2024-06-15: 1 mg via INTRAVENOUS
  Filled 2024-06-15: qty 1

## 2024-06-15 MED ORDER — INSULIN GLARGINE 100 UNIT/ML ~~LOC~~ SOLN
40.0000 [IU] | Freq: Every day | SUBCUTANEOUS | Status: DC
Start: 2024-06-15 — End: 2024-06-15
  Filled 2024-06-15: qty 0.4

## 2024-06-15 MED ORDER — LEVOTHYROXINE SODIUM 100 MCG PO TABS
100.0000 ug | ORAL_TABLET | Freq: Every day | ORAL | Status: DC
  Administered 2024-06-16 – 2024-06-20 (×5): 100 ug via ORAL
  Filled 2024-06-15 (×5): qty 1

## 2024-06-15 MED ORDER — ENOXAPARIN SODIUM 40 MG/0.4ML IJ SOSY: 40.0000 mg | PREFILLED_SYRINGE | INTRAMUSCULAR | Status: DC

## 2024-06-15 MED ORDER — PREGABALIN 100 MG PO CAPS
200.0000 mg | ORAL_CAPSULE | Freq: Once | ORAL | Status: AC | PRN
  Administered 2024-06-15: 200 mg via ORAL
  Filled 2024-06-15: qty 2

## 2024-06-15 MED ORDER — HYDROCODONE-ACETAMINOPHEN 5-325 MG PO TABS
1.0000 | ORAL_TABLET | Freq: Four times a day (QID) | ORAL | Status: DC | PRN
  Administered 2024-06-16 – 2024-06-20 (×12): 1 via ORAL
  Filled 2024-06-15 (×12): qty 1

## 2024-06-15 MED ORDER — CARVEDILOL 25 MG PO TABS
25.0000 mg | ORAL_TABLET | Freq: Two times a day (BID) | ORAL | Status: DC
  Administered 2024-06-15 – 2024-06-20 (×11): 25 mg via ORAL
  Filled 2024-06-15 (×3): qty 1
  Filled 2024-06-15: qty 2
  Filled 2024-06-15 (×7): qty 1

## 2024-06-15 NOTE — H&P (Addendum)
 History and Physical    Anita James:[id number] DOB: 1951/02/27 DOA: 06/14/2024  DOS: the patient was seen and examined on 06/14/2024  PCP: Shannon, Jaimie, NP   Patient coming from: Home  I have personally briefly reviewed patient's old medical records in [facility] and CareEverywhere  HPI:  Anita James is a 73 y.o. female with medical history significant of CHF, T2DM, CKD, Class III obesity presented to [facility] with complaints of worsening extremity edema, shortness of breath, abdominal distention and admitted to [facility] for CHF exacerbation.  Patient states over the last week she has been having more shortness of breath and ankle swelling.  She was on Lasix  but has not been taking it as she thinks it is killing her kidneys.  She states she has exertional dyspnea but denies orthopnea.   ED Course: Patient seen in [facility] ED and admitted to [facility].  D-dimer was elevated so DVT and PE study were done and they were negative.  Patient was given IV Lasix .  She reports some response to that.  Lab work otherwise showed normal CBC, slight elevation of creatinine and slightly elevated troponin that was flat and without any associated EKG changes.  BNP that was slightly elevated especially compared to her baseline. [facility] contacted for admission.  Review of Systems: Negative unless stated otherwise in the HPI.    Past Medical History:  Diagnosis Date   Abdominal pain    Anxiety    Carpal tunnel syndrome 06/14/2014   Chest pain    CHF (congestive heart failure) (HCC)    Chronic back pain    Chronic kidney disease 03/2013   failure due to meds   Diabetes mellitus type II, uncontrolled    Diverticulitis    Frequent headaches    Hiatal hernia    Hyperlipemia    Hypertension    Hypothyroidism    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   Neuropathy    Obesity    Pneumonia    Vitamin D  deficiency 06/05/2014    Past Surgical History:  Procedure Laterality Date    CESAREAN SECTION     COLONOSCOPY WITH PROPOFOL  N/A 07/01/2021   Procedure: COLONOSCOPY WITH PROPOFOL ;  Surgeon: Saintclair Jasper, MD;  Location: [facility];  Service: Gastroenterology;  Laterality: N/A;   KNEE SURGERY Left    THYROID SURGERY     goiter     reports that she has never smoked. She has never used smokeless tobacco. She reports that she does not drink alcohol and does not use drugs.  Allergies  Allergen Reactions   Amoxicillin-Pot Clavulanate Nausea And Vomiting and Other (See Comments)    Combination of medications tear lining of stomach  Other Reaction(s): GI Intolerance, Not available   Oxycodone  Shortness Of Breath    oxycontin  too   Penicillins Anaphylaxis   Sulfa Antibiotics Shortness Of Breath   Norvasc  [Amlodipine  Besylate] Swelling    feet   Sulfamethoxazole Nausea Only and Other (See Comments)    Other Reaction(s): GI Intolerance   Tramadol  Other (See Comments)    Unknown, toxicity. Patient advised she had to be given Narcan with this   Amoxicillin Rash    Family History  Problem Relation Age of Onset   Cancer Mother        cervical- mets   Breast cancer Mother    Cancer Father    Heart disease Father    Diabetes Father    Diabetes Sister    Breast cancer Sister  Heart disease Sister    Breast cancer Sister    Heart disease Sister    Breast cancer Sister    Breast cancer Sister    COPD Sister    Breast cancer Sister    Colon cancer Neg Hx    Esophageal cancer Neg Hx    Rectal cancer Neg Hx    Stomach cancer Neg Hx     Prior to Admission medications   Medication Sig Start Date End Date Taking? Authorizing Provider  albuterol  (ACCUNEB ) 0.63 MG/3ML nebulizer solution Take 1 ampule by nebulization 3 (three) times daily as needed for wheezing or shortness of breath. 01/07/21  Yes [provider]  albuterol  (VENTOLIN  HFA) 108 (90 Base) MCG/ACT inhaler Inhale 2 puffs into the lungs 3 (three) times daily as needed. 03/23/24  Yes [provider]  allopurinol  (ZYLOPRIM ) 100 MG tablet Take 100 mg by mouth daily.   Yes [provider]  aspirin  EC 81 MG tablet Take 81 mg by mouth daily. 11/20/16  Yes [provider]  atorvastatin  (LIPITOR ) 40 MG tablet Take 40 mg by mouth at bedtime. 03/08/24  Yes [provider]  Budeson-Glycopyrrol-Formoterol  (BREZTRI  AEROSPHERE) 160-9-4.8 MCG/ACT AERO Inhale 2 puffs into the lungs in the morning and at bedtime.   Yes [provider]  carvedilol  (COREG ) 25 MG tablet Take 25 mg by mouth 2 (two) times daily.   Yes [provider]  cetirizine  (ZYRTEC ) 10 MG tablet Take 10 mg by mouth daily as needed for allergies.   Yes [provider]  Continuous Glucose Sensor (FREESTYLE LIBRE 3 PLUS SENSOR) MISC SMARTSIG:Every 3 Days 04/04/24  Yes [provider]  CRANBERRY PO Take 1 tablet by mouth daily.   Yes [provider]  diclofenac  sodium (VOLTAREN ) 1 % GEL Apply 4 g topically 4 (four) times daily. Patient taking differently: Apply 4 g topically daily as needed (pain). 02/27/19  Yes Emil Share, DO  empagliflozin  (JARDIANCE ) 10 MG TABS tablet Take 10 mg by mouth daily.   Yes [provider]  feeding supplement, GLUCERNA SHAKE, (GLUCERNA SHAKE) LIQD Take 237 mLs by mouth every morning.   Yes [provider]  fluticasone  (FLONASE ) 50 MCG/ACT nasal spray PLACE 2 SPRAYS IN EACH NOSTRIL DAILY AS NEEDED FOR ALLERGIES 07/21/19  Yes Vicci Barnie NOVAK, MD  HYDROcodone -acetaminophen  (NORCO/VICODIN) 5-325 MG tablet Take 2 tablets by mouth every 4 (four) hours as needed. 05/02/24  Yes Delo, Vicenta, MD  insulin  aspart (NOVOLOG  FLEXPEN) 100 UNIT/ML FlexPen Inject 60 Units into the skin 3 (three) times daily with meals. Patient taking differently: Inject 20-45 Units into the skin 3 (three) times daily after meals. 12/05/19  Yes Vicci Barnie NOVAK, MD  Insulin  Glargine (LANTUS  SOLOSTAR) 100 UNIT/ML Solostar Pen Inject 84 Units into the  skin daily. Patient taking differently: Inject 100 Units into the skin daily. 10/13/19  Yes Vicci Barnie NOVAK, MD  levothyroxine  (SYNTHROID ) 100 MCG tablet Take 1 tablet (100 mcg total) by mouth daily. 10/13/19  Yes Vicci Barnie NOVAK, MD  lidocaine  (LIDODERM ) 5 % Place 1 patch onto the skin every 12 (twelve) hours. Remove & Discard patch within 12 hours or as directed by MD Patient taking differently: Place 1 patch onto the skin daily as needed (for pain). 10/19/23  Yes Qiao, Jana W, MD  NYSTATIN  powder APPLY TOPICALLY 4 TIMES DAILY. Patient taking differently: Apply 1 Bottle topically 4 (four) times daily as needed (rash). 01/18/17  Yes Marilyne Stephane DASEN, MD  omeprazole (PRILOSEC) 20 MG  capsule Take 20 mg by mouth daily.   Yes [provider]  pregabalin  (LYRICA ) 150 MG capsule Take 150 mg by mouth daily. 05/26/24  Yes [provider]  pregabalin  (LYRICA ) 300 MG capsule Take 300 mg by mouth daily. 05/26/24  Yes [provider]  pregabalin  (LYRICA ) 75 MG capsule Take 75 mg by mouth 2 (two) times daily.   Yes [provider]  PREMARIN vaginal cream Place 1 applicator vaginally daily as needed (dryness). 03/23/24  Yes [provider]  Zinc Acetate 50 MG CAPS Take 1 capsule by mouth in the morning and at bedtime.   Yes [provider]  Accu-Chek FastClix Lancets MISC USE AS DIRECTED TO CHECK BLOOD SUGAR THREE TIMES DAILY 11/07/19   Vicci Barnie NOVAK, MD  ACCU-CHEK GUIDE test strip USE AS DIRECTED TO CHECK BLOOD SUGAR THREE TIMES DAILY 10/04/19   Vicci Barnie NOVAK, MD  Blood Glucose Monitoring Suppl (ACCU-CHEK AVIVA PLUS) w/Device KIT USE AS DIRECTED TO TEST TWICE DAILY 04/27/18   Vicci Barnie NOVAK, MD  Blood Pressure Monitoring KIT Use daily. 12/24/23   Court Dorn PARAS, MD  hydrochlorothiazide  (MICROZIDE ) 12.5 MG capsule Take 12.5 mg by mouth daily. Patient not taking: Reported on 06/15/2024 03/23/24   [provider]  Insulin  Pen Needle (TRUEPLUS  PEN NEEDLES) 32G X 4 MM MISC Use to inject insulin . 10/13/18   Vicci Barnie NOVAK, MD  NOVOFINE 32G X 6 MM MISC SMARTSIG:1 SUB-Q 4-5 Times Daily 01/22/20   [provider]    Physical Exam: Vitals:   06/15/24 0830 06/15/24 0900 06/15/24 1127 06/15/24 1324  BP: (!) 169/62 (!) 149/84 (!) 154/79 139/84  Pulse: 75 74 69   Resp: 17 13 15 18   Temp:   97.9 F (36.6 C) 97.8 F (36.6 C)  TempSrc:   Oral Oral  SpO2: 95% 95% 94%   Weight:      Height:        General: in NAD CV: normal heart sounds and good perfusion, no JVD, 1 lower extremity edema Pulm: Normal lung sounds, no rales appreciated GI: umbilical hernia present MSK: no asymmetry Skin: no lesions noted Neuro: alert and oriented x4  Labs on Admission: I have personally reviewed following labs and imaging studies  CBC: Recent Labs  Lab 06/14/24 2358 06/15/24 1915  WBC 7.2 7.1  NEUTROABS 5.1  --   HGB 13.1 13.9  HCT 37.6 42.1  MCV 89.5 92.3  PLT 182 168   Basic Metabolic Panel: Recent Labs  Lab 06/14/24 2358  NA 140  K 3.8  CL 103  CO2 25  GLUCOSE 224*  BUN 21  CREATININE 1.17*  CALCIUM  8.7*   GFR: Estimated Creatinine Clearance: 67.5 mL/min (A) (by C-G formula based on SCr of 1.17 mg/dL (H)). Liver Function Tests: Recent Labs  Lab 06/14/24 2358  AST 20  ALT 13  ALKPHOS 77  BILITOT 0.4  PROT 6.0*  ALBUMIN 3.5   No results for input(s): LIPASE, AMYLASE in the last 168 hours. No results for input(s): AMMONIA in the last 168 hours. Coagulation Profile: No results for input(s): INR, PROTIME in the last 168 hours. Cardiac Enzymes: Recent Labs  Lab 06/14/24 2358  CKTOTAL 119   BNP (last 3 results) No results for input(s): BNP in the last 8760 hours. HbA1C: Recent Labs    06/15/24 1915  HGBA1C 7.1*   CBG: Recent Labs  Lab 06/15/24 0817 06/15/24 1154 06/15/24 1813  GLUCAP 186* 173* 217*   Lipid Profile:  No results for input(s): CHOL, HDL, LDLCALC, TRIG,  CHOLHDL, LDLDIRECT in the last 72 hours. Thyroid Function Tests: No results for input(s): TSH, T4TOTAL, FREET4, T3FREE, THYROIDAB in the last 72 hours. Anemia Panel: No results for input(s): VITAMINB12, FOLATE, FERRITIN, TIBC, IRON, RETICCTPCT in the last 72 hours. Urine analysis:    Component Value Date/Time   COLORURINE YELLOW 12/16/2023 1443   APPEARANCEUR CLEAR 12/16/2023 1443   APPEARANCEUR Clear 05/09/2019 1512   LABSPEC 1.015 12/16/2023 1443   LABSPEC 1.005 06/21/2013 0438   PHURINE 6.0 12/16/2023 1443   GLUCOSEU >=500 (A) 12/16/2023 1443   GLUCOSEU Negative 06/21/2013 0438   HGBUR TRACE (A) 12/16/2023 1443   BILIRUBINUR NEGATIVE 12/16/2023 1443   BILIRUBINUR Negative 05/09/2019 1512   BILIRUBINUR Negative 06/21/2013 0438   KETONESUR NEGATIVE 12/16/2023 1443   PROTEINUR >=300 (A) 12/16/2023 1443   UROBILINOGEN 0.2 08/03/2018 1707   UROBILINOGEN 0.2 12/23/2013 1408   NITRITE NEGATIVE 12/16/2023 1443   LEUKOCYTESUR NEGATIVE 12/16/2023 1443   LEUKOCYTESUR Trace 06/21/2013 0438    Radiological Exams on Admission: I have personally reviewed images CT Angio Chest Pulmonary Embolism (PE) W or WO Contrast Result Date: 06/15/2024 EXAM: CTA of the Chest with contrast for PE 06/15/2024 03:45:44 AM TECHNIQUE: CTA of the chest was performed with the administration of 75 mL of intravenous iohexol  (OMNIPAQUE ) 350 MG/ML injection 100 mL IOHEXOL  350 MG/ML SOLN. Multiplanar reformatted images are provided for review. MIP images are provided for review. Automated exposure control, iterative reconstruction, and/or weight based adjustment of the mA/kV was utilized to reduce the radiation dose to as low as reasonably achievable. COMPARISON: 05/04/2024 CLINICAL HISTORY: Pulmonary embolism (PE) suspected, low to intermediate prob, positive D-dimer. Pt states she developed a sharp pain in her left leg that shoots up to her private area. FINDINGS: PULMONARY ARTERIES: Pulmonary  arteries are adequately opacified for evaluation. No pulmonary embolism. Main pulmonary artery is normal in caliber. MEDIASTINUM: The heart demonstrates mild cardiomegaly. There is mild thoracic aortic atherosclerosis. LYMPH NODES: No mediastinal, hilar or axillary lymphadenopathy. LUNGS AND PLEURA: Scattered mild perihilar ground glass opacities in the lungs bilaterally, possibly reflecting mild interstitial edema, less likely mosaic attenuation related to air trapping. No focal consolidation or pulmonary edema. No pleural effusion or pneumothorax. UPPER ABDOMEN: Limited images of the upper abdomen are unremarkable. SOFT TISSUES AND BONES: Mild degenerative changes of the mid/lower thoracic spine. No acute bone or soft tissue abnormality. IMPRESSION: 1. No evidence of pulmonary embolism. 2. Scattered mild perihilar ground-glass opacities, possibly reflecting mild interstitial edema versus air trapping. Electronically signed by: Pinkie Pebbles MD 06/15/2024 03:55 AM EDT RP Workstation: [id number]   US  Venous Img Lower Unilateral Left Result Date: 06/15/2024 EXAM: ULTRASOUND DUPLEX OF THE LEFT LOWER EXTREMITY VEINS TECHNIQUE: Duplex ultrasound using B-mode/gray scaled imaging and Doppler spectral analysis and color flow was obtained of the deep venous structures of the left lower extremity. COMPARISON: None. CLINICAL HISTORY: Pain, swelling. FINDINGS: The common femoral vein, femoral vein, popliteal vein, posterior tibial and gastrocnemius veins of the right lower extremity demonstrate normal compressibility with normal color flow and spectral analysis. The greater saphenous vein is patent and demonstrates appropriate antegrade flow IMPRESSION: 1. No evidence of DVT. Electronically signed by: Dorethia Molt MD 06/15/2024 12:43 AM EDT RP Workstation: [id number]   DG Chest Port 1 View Result Date: 06/15/2024 CLINICAL DATA:  Shortness of breath EXAM: PORTABLE CHEST 1 VIEW COMPARISON:  05/04/2024 FINDINGS:  Cardiomegaly. No confluent airspace opacities, effusions or edema. No acute bony abnormality. IMPRESSION: Cardiomegaly.  No active disease. Electronically Signed   By: Franky Crease M.D.   On: 06/15/2024 00:07    EKG: My personal interpretation of EKG shows: NSR with low voltages without any acute findings, unchanged from prior EKG.   Assessment/Plan Principal Problem:   Acute CHF (congestive heart failure) (HCC)   Acute HF exacerbation: Patient with mild to moderate heart failure exacerbation with symptoms of dyspnea and worsening swelling along with elevation in BNP.  She was status post IV Lasix .  Patient with last echo in March 2025 showing normal EF but impaired laxation.  Will repeat echocardiogram again.  Patient is nonadherent to her Lasix  as she thinks it has side effects.  She will benefit from continued education regarding this as she will benefit from low dose oral Lasix .  OSA: restart home CPAP.   Chronic pain syndrome: Patient endorsed being on chronic opioids but chart review shows she has only been getting intermittent opioid for pain.  I changed her order to decrease the dose and interval but she will need further discussion that she cannot be discharged with opioids.  She will benefit from weight loss and PT.  Gout: Chronic continue home allopurinol   Class III obesity: Will need to discuss GLP 1 agonist as her prior heart failure, OSA, chronic pain as well.  Reactive airway disease: Sees Dr. Gene with [facility] pulmonology.  Continue home inhalers.  Lung sounds unremarkable.  Hypothyroidism: Chronic.  Continue Synthroid .  T2DM: A1c at 7.1. Semglee  started at 40 units nightly with SSI coverage. Monitor CBGs.   VTE prophylaxis:  Lovenox  Diet: HH Code Status:  Full Code Telemetry:  Admission status: Inpatient, Telemetry bed Patient is from: Home  Anticipated d/c is to: Home  Anticipated d/c date is: 1-2 days   Severity of Illness: The appropriate patient status for  this patient is INPATIENT. Inpatient status is judged to be reasonable and necessary in order to provide the required intensity of service to ensure the patient's safety. The patient's presenting symptoms, physical exam findings, and initial radiographic and laboratory data in the context of their chronic comorbidities is felt to place them at high risk for further clinical deterioration. Furthermore, it is not anticipated that the patient will be medically stable for discharge from the hospital within 2 midnights of admission.   * I certify that at the point of admission it is my clinical judgment that the patient will require inpatient hospital care spanning beyond 2 midnights from the point of admission due to high intensity of service, high risk for further deterioration and high frequency of surveillance required.DEWAINE Morene Bathe, MD [facility] DEL. [facility]

## 2024-06-15 NOTE — Plan of Care (Signed)
  Problem: Education: Goal: Ability to describe self-care measures that may prevent or decrease complications (Diabetes Survival Skills Education) will improve Outcome: Progressing   Problem: Coping: Goal: Ability to adjust to condition or change in health will improve Outcome: Progressing

## 2024-06-15 NOTE — ED Notes (Signed)
 [facility] at bedside

## 2024-06-15 NOTE — ED Provider Notes (Signed)
 [place] EMERGENCY DEPARTMENT AT [facility] Provider Note   CSN: [id number] Arrival date & time: 06/14/24  2223     Patient presents with: Leg Pain   Anita James is a 73 y.o. female.   Presents to the emergency for evaluation of multiple problems.  Patient here primarily because of sudden onset of severe pain in the lower leg radiating all the way up to the groin.  Denies injury.  Patient does report a history of congestive heart failure.  She does have swelling of her legs and is experiencing increased shortness of breath.  Patient reports that she does have a fluid pill prescribed that is only to be used as needed.  It sounds like she was on a diuretic daily until she had kidney injury and it was changed to as needed.       Prior to Admission medications   Medication Sig Start Date End Date Taking? Authorizing Provider  Accu-Chek FastClix Lancets MISC USE AS DIRECTED TO CHECK BLOOD SUGAR THREE TIMES DAILY 11/07/19   Vicci Barnie NOVAK, MD  ACCU-CHEK GUIDE test strip USE AS DIRECTED TO CHECK BLOOD SUGAR THREE TIMES DAILY 10/04/19   Vicci Barnie NOVAK, MD  albuterol  (ACCUNEB ) 0.63 MG/3ML nebulizer solution Take 1 ampule by nebulization 3 (three) times daily as needed for wheezing or shortness of breath. 01/07/21   [provider]  albuterol  (VENTOLIN  HFA) 108 (90 Base) MCG/ACT inhaler Inhale 2 puffs into the lungs 3 (three) times daily as needed. 03/23/24   [provider]  allopurinol  (ZYLOPRIM ) 100 MG tablet Take 100 mg by mouth daily.    [provider]  aspirin  EC 81 MG tablet Take 81 mg by mouth daily. 11/20/16   [provider]  atorvastatin  (LIPITOR ) 40 MG tablet Take 40 mg by mouth at bedtime. 03/08/24   [provider]  atorvastatin  (LIPITOR ) 80 MG tablet Take 1 tablet (80 mg total) by mouth daily. 11/23/23   Pokhrel, Laxman, MD  Blood Glucose Monitoring Suppl (ACCU-CHEK AVIVA PLUS) w/Device KIT USE AS DIRECTED TO TEST TWICE DAILY  04/27/18   Vicci Barnie NOVAK, MD  Blood Pressure Monitoring KIT Use daily. 12/24/23   Court Dorn PARAS, MD  Budeson-Glycopyrrol-Formoterol  (BREZTRI  AEROSPHERE) 160-9-4.8 MCG/ACT AERO Inhale 2 puffs into the lungs in the morning and at bedtime.    [provider]  carvedilol  (COREG ) 25 MG tablet Take 25 mg by mouth 2 (two) times daily.    [provider]  cetirizine  (ZYRTEC ) 10 MG tablet Take 10 mg by mouth daily as needed for allergies.    [provider]  Continuous Glucose Sensor (FREESTYLE LIBRE 3 PLUS SENSOR) MISC SMARTSIG:Every 3 Days 04/04/24   [provider]  diclofenac  sodium (VOLTAREN ) 1 % GEL Apply 4 g topically 4 (four) times daily. Patient taking differently: Apply 4 g topically daily as needed (pain). 02/27/19   Emil Share, DO  empagliflozin  (JARDIANCE ) 10 MG TABS tablet Take 10 mg by mouth daily.    [provider]  feeding supplement, GLUCERNA SHAKE, (GLUCERNA SHAKE) LIQD Take 237 mLs by mouth every morning.    [provider]  fluticasone  (FLONASE ) 50 MCG/ACT nasal spray PLACE 2 SPRAYS IN EACH NOSTRIL DAILY AS NEEDED FOR ALLERGIES 07/21/19   Vicci Barnie NOVAK, MD  hydrochlorothiazide  (MICROZIDE ) 12.5 MG capsule Take 12.5 mg by mouth daily. 03/23/24   [provider]  HYDROcodone -acetaminophen  (NORCO/VICODIN) 5-325 MG tablet Take 2 tablets by mouth every 4 (four) hours as needed. 05/02/24  Geroldine Berg, MD  insulin  aspart (NOVOLOG  FLEXPEN) 100 UNIT/ML FlexPen Inject 60 Units into the skin 3 (three) times daily with meals. Patient taking differently: Inject 20-45 Units into the skin 3 (three) times daily after meals. 12/05/19   Vicci Barnie NOVAK, MD  Insulin  Glargine (LANTUS  SOLOSTAR) 100 UNIT/ML Solostar Pen Inject 84 Units into the skin daily. Patient taking differently: Inject 100 Units into the skin daily. 10/13/19   Vicci Barnie NOVAK, MD  Insulin  Pen Needle (TRUEPLUS PEN NEEDLES) 32G X 4 MM MISC Use to inject insulin .  10/13/18   Vicci Barnie NOVAK, MD  levothyroxine  (SYNTHROID ) 100 MCG tablet Take 1 tablet (100 mcg total) by mouth daily. 10/13/19   Vicci Barnie NOVAK, MD  lidocaine  (LIDODERM ) 5 % Place 1 patch onto the skin every 12 (twelve) hours. Remove & Discard patch within 12 hours or as directed by MD 10/19/23   Qiao, Jana W, MD  LIVALO 4 MG TABS Take 1 tablet by mouth at bedtime. 01/15/24   [provider]  NOVOFINE 32G X 6 MM MISC SMARTSIG:1 SUB-Q 4-5 Times Daily 01/22/20   [provider]  NYSTATIN  powder APPLY TOPICALLY 4 TIMES DAILY. Patient taking differently: Apply 1 Bottle topically 4 (four) times daily as needed (rash). 01/18/17   Marilyne Stephane DASEN, MD  omeprazole (PRILOSEC) 20 MG capsule Take 20 mg by mouth daily.    [provider]  pregabalin  (LYRICA ) 75 MG capsule Take 75 mg by mouth 2 (two) times daily.    [provider]  PREMARIN vaginal cream Place vaginally. 03/23/24   [provider]  torsemide  (DEMADEX ) 20 MG tablet Take 20 mg by mouth daily as needed (fluid). 05/09/18   [provider]  Zinc Acetate 50 MG CAPS Take 1 capsule by mouth in the morning and at bedtime.    [provider]    Allergies: Amoxicillin-pot clavulanate, Oxycodone , Sulfa antibiotics, Amoxicillin, Norvasc  [amlodipine  besylate], Penicillins, Sulfamethoxazole, and Tramadol     Review of Systems  Updated Vital Signs BP (!) 182/74 (BP Location: Left Arm)   Pulse 85   Temp 98.1 F (36.7 C) (Oral)   Resp 19   Ht 5' 6 (1.676 m)   Wt (!) 149.7 kg   SpO2 94%   BMI 53.26 kg/m   Physical Exam Vitals and nursing note reviewed.  Constitutional:      General: She is not in acute distress.    Appearance: She is well-developed. She is ill-appearing.  HENT:     Head: Normocephalic and atraumatic.     Mouth/Throat:     Mouth: Mucous membranes are moist.  Eyes:     General: Vision grossly intact. Gaze aligned appropriately.     Extraocular Movements:  Extraocular movements intact.     Conjunctiva/sclera: Conjunctivae normal.  Cardiovascular:     Rate and Rhythm: Normal rate and regular rhythm.     Pulses:          Dorsalis pedis pulses are 1+ on the right side and 1+ on the left side.     Heart sounds: Normal heart sounds, S1 normal and S2 normal. No murmur heard.    No friction rub. No gallop.  Pulmonary:     Effort: Pulmonary effort is normal. Tachypnea present. No respiratory distress.     Breath sounds: Decreased breath sounds present.  Abdominal:     General: Bowel sounds are normal.     Palpations: Abdomen is soft.     Tenderness: There is no abdominal tenderness.  There is no guarding or rebound.     Hernia: No hernia is present.  Musculoskeletal:        General: No swelling.     Cervical back: Full passive range of motion without pain, normal range of motion and neck supple. No spinous process tenderness or muscular tenderness. Normal range of motion.     Right lower leg: Edema present.     Left lower leg: Edema present.  Skin:    General: Skin is warm and dry.     Capillary Refill: Capillary refill takes less than 2 seconds.     Findings: No ecchymosis, erythema, rash or wound.  Neurological:     General: No focal deficit present.     Mental Status: She is alert and oriented to person, place, and time.     GCS: GCS eye subscore is 4. GCS verbal subscore is 5. GCS motor subscore is 6.     Cranial Nerves: Cranial nerves 2-12 are intact.     Sensory: Sensation is intact.     Motor: Motor function is intact.     Coordination: Coordination is intact.  Psychiatric:        Attention and Perception: Attention normal.        Mood and Affect: Mood normal.        Speech: Speech normal.        Behavior: Behavior normal.     (all labs ordered are listed, but only abnormal results are displayed) Labs Reviewed  COMPREHENSIVE METABOLIC PANEL WITH GFR - Abnormal; Notable for the following components:      Result Value    Glucose, Bld 224 (*)    Creatinine, Ser 1.17 (*)    Calcium  8.7 (*)    Total Protein 6.0 (*)    GFR, Estimated 50 (*)    All other components within normal limits  D-DIMER, QUANTITATIVE - Abnormal; Notable for the following components:   D-Dimer, Quant 1.18 (*)    All other components within normal limits  PRO BRAIN NATRIURETIC PEPTIDE - Abnormal; Notable for the following components:   Pro Brain Natriuretic Peptide 356.0 (*)    All other components within normal limits  TROPONIN T, HIGH SENSITIVITY - Abnormal; Notable for the following components:   Troponin T High Sensitivity 28 (*)    All other components within normal limits  TROPONIN T, HIGH SENSITIVITY - Abnormal; Notable for the following components:   Troponin T High Sensitivity 29 (*)    All other components within normal limits  CBC WITH DIFFERENTIAL/PLATELET  LACTIC ACID, PLASMA  CK    EKG: EKG Interpretation Date/Time:  Thursday June 15 2024 00:42:35 EDT Ventricular Rate:  85 PR Interval:  199 QRS Duration:  107 QT Interval:  419 QTC Calculation: 499 R Axis:   -33  Text Interpretation: Sinus rhythm Left axis deviation Low voltage, precordial leads Consider anterior infarct Confirmed by Haze Lonni PARAS [phone]) on 06/15/2024 1:02:13 AM  Radiology: CT Angio Chest Pulmonary Embolism (PE) W or WO Contrast Result Date: 06/15/2024 EXAM: CTA of the Chest with contrast for PE 06/15/2024 03:45:44 AM TECHNIQUE: CTA of the chest was performed with the administration of 75 mL of intravenous iohexol  (OMNIPAQUE ) 350 MG/ML injection 100 mL IOHEXOL  350 MG/ML SOLN. Multiplanar reformatted images are provided for review. MIP images are provided for review. Automated exposure control, iterative reconstruction, and/or weight based adjustment of the mA/kV was utilized to reduce the radiation dose to as low as reasonably achievable. COMPARISON: 05/04/2024 CLINICAL HISTORY:  Pulmonary embolism (PE) suspected, low to intermediate  prob, positive D-dimer. Pt states she developed a sharp pain in her left leg that shoots up to her private area. FINDINGS: PULMONARY ARTERIES: Pulmonary arteries are adequately opacified for evaluation. No pulmonary embolism. Main pulmonary artery is normal in caliber. MEDIASTINUM: The heart demonstrates mild cardiomegaly. There is mild thoracic aortic atherosclerosis. LYMPH NODES: No mediastinal, hilar or axillary lymphadenopathy. LUNGS AND PLEURA: Scattered mild perihilar ground glass opacities in the lungs bilaterally, possibly reflecting mild interstitial edema, less likely mosaic attenuation related to air trapping. No focal consolidation or pulmonary edema. No pleural effusion or pneumothorax. UPPER ABDOMEN: Limited images of the upper abdomen are unremarkable. SOFT TISSUES AND BONES: Mild degenerative changes of the mid/lower thoracic spine. No acute bone or soft tissue abnormality. IMPRESSION: 1. No evidence of pulmonary embolism. 2. Scattered mild perihilar ground-glass opacities, possibly reflecting mild interstitial edema versus air trapping. Electronically signed by: Pinkie Pebbles MD 06/15/2024 03:55 AM EDT RP Workstation: [id number]   US  Venous Img Lower Unilateral Left Result Date: 06/15/2024 EXAM: ULTRASOUND DUPLEX OF THE LEFT LOWER EXTREMITY VEINS TECHNIQUE: Duplex ultrasound using B-mode/gray scaled imaging and Doppler spectral analysis and color flow was obtained of the deep venous structures of the left lower extremity. COMPARISON: None. CLINICAL HISTORY: Pain, swelling. FINDINGS: The common femoral vein, femoral vein, popliteal vein, posterior tibial and gastrocnemius veins of the right lower extremity demonstrate normal compressibility with normal color flow and spectral analysis. The greater saphenous vein is patent and demonstrates appropriate antegrade flow IMPRESSION: 1. No evidence of DVT. Electronically signed by: Dorethia Molt MD 06/15/2024 12:43 AM EDT RP Workstation: [id number]    DG Chest Port 1 View Result Date: 06/15/2024 CLINICAL DATA:  Shortness of breath EXAM: PORTABLE CHEST 1 VIEW COMPARISON:  05/04/2024 FINDINGS: Cardiomegaly. No confluent airspace opacities, effusions or edema. No acute bony abnormality. IMPRESSION: Cardiomegaly.  No active disease. Electronically Signed   By: Franky Crease M.D.   On: 06/15/2024 00:07     Procedures   Medications Ordered in the ED  HYDROmorphone  (DILAUDID ) injection 1 mg (1 mg Intravenous Given 06/15/24 0010)  ondansetron  (ZOFRAN ) injection 4 mg (4 mg Intravenous Given 06/15/24 0010)  furosemide  (LASIX ) injection 40 mg (40 mg Intravenous Given 06/15/24 0134)  HYDROmorphone  (DILAUDID ) injection 0.5 mg (0.5 mg Intravenous Given 06/15/24 0307)  iohexol  (OMNIPAQUE ) 350 MG/ML injection 100 mL (75 mLs Intravenous Contrast Given 06/15/24 0336)                                    Medical Decision Making Amount and/or Complexity of Data Reviewed External Data Reviewed: labs, radiology, ECG and notes. Labs: ordered. Decision-making details documented in ED Course. Radiology: ordered and independent interpretation performed. Decision-making details documented in ED Course. ECG/medicine tests: ordered and independent interpretation performed. Decision-making details documented in ED Course.  Risk Prescription drug management. Decision regarding hospitalization.   Differential diagnosis considered includes, but not limited to:  Orthopedic injury; arthritis; septic arthritis; ischemic limb; DVT; cellulitis; phlebitis; chf  Presents to the emergency department for evaluation of left leg pain that suddenly began before coming to the ED.  No known trauma.  Patient reports pain primarily from the lower part of the leg but it does radiate up to the groin area.  Patient with diffuse swelling of both legs.  She has a history of diastolic heart failure, not currently on scheduled diuretics.  Patient appears dyspneic.  She is requiring  supplemental oxygen  here in the emergency department.  She does not use oxygen  at home.  Slight elevation of troponin, consistent with prior chronic elevations.  Troponins are plateaued, no concern for ACS.  BNP is elevated from baseline.  Imaging consistent with mild pulmonary edema.  CT angiography without evidence of PE.  DVT study negative.  Patient with peripheral pulses in the lower extremities, no concern for ischemic leg.  No overlying cellulitis.  No signs of septic arthritis.  Patient given IV Lasix  and will be admitted for CHF exacerbation with mild O2 requirement.  CRITICAL CARE Performed by: Lonni JINNY Seats   Total critical care time: 30 minutes  Critical care time was exclusive of separately billable procedures and treating other patients.  Critical care was necessary to treat or prevent imminent or life-threatening deterioration.  Critical care was time spent personally by me on the following activities: development of treatment plan with patient and/or surrogate as well as nursing, discussions with consultants, evaluation of patient's response to treatment, examination of patient, obtaining history from patient or surrogate, ordering and performing treatments and interventions, ordering and review of laboratory studies, ordering and review of radiographic studies, pulse oximetry and re-evaluation of patient's condition.      Final diagnoses:  Acute on chronic diastolic congestive heart failure [facility])    ED Discharge Orders     None          Seats Lonni JINNY, MD 06/15/24 [phone]

## 2024-06-15 NOTE — ED Notes (Signed)
 Patient transported to CT

## 2024-06-15 NOTE — ED Notes (Signed)
 [facility] called for transport

## 2024-06-15 NOTE — ED Notes (Signed)
 Pt sitting up in recliner chair, can not tolerate laying on stretcher. Pt is positioned with pillows and comfortable at this time.

## 2024-06-15 NOTE — Progress Notes (Signed)
 Placed patient on CPAP for the night at 15cm with oxygen  set at 2lpm

## 2024-06-16 ENCOUNTER — Inpatient Hospital Stay (HOSPITAL_COMMUNITY)

## 2024-06-16 DIAGNOSIS — I5032 Chronic diastolic (congestive) heart failure: Secondary | ICD-10-CM

## 2024-06-16 DIAGNOSIS — Z794 Long term (current) use of insulin: Secondary | ICD-10-CM

## 2024-06-16 DIAGNOSIS — I5033 Acute on chronic diastolic (congestive) heart failure: Secondary | ICD-10-CM | POA: Diagnosis not present

## 2024-06-16 DIAGNOSIS — E119 Type 2 diabetes mellitus without complications: Secondary | ICD-10-CM

## 2024-06-16 DIAGNOSIS — G894 Chronic pain syndrome: Secondary | ICD-10-CM

## 2024-06-16 LAB — GLUCOSE, CAPILLARY
Glucose-Capillary: 222 mg/dL — ABNORMAL HIGH (ref 70–99)
Glucose-Capillary: 278 mg/dL — ABNORMAL HIGH (ref 70–99)
Glucose-Capillary: 256 mg/dL — ABNORMAL HIGH (ref 70–99)
Glucose-Capillary: 184 mg/dL — ABNORMAL HIGH (ref 70–99)
Glucose-Capillary: 178 mg/dL — ABNORMAL HIGH (ref 70–99)

## 2024-06-16 LAB — BLOOD GAS, ARTERIAL
Acid-Base Excess: 8.6 mmol/L — ABNORMAL HIGH (ref 0.0–2.0)
Bicarbonate: 33.5 mmol/L — ABNORMAL HIGH (ref 20.0–28.0)
Drawn by: 137461
O2 Saturation: 99.3 %
Patient temperature: 36.7
pCO2 arterial: 45 mmHg (ref 32–48)
pH, Arterial: 7.48 — ABNORMAL HIGH (ref 7.35–7.45)
pO2, Arterial: 112 mmHg — ABNORMAL HIGH (ref 83–108)

## 2024-06-16 LAB — BASIC METABOLIC PANEL WITH GFR
Anion gap: 8 (ref 5–15)
BUN: 24 mg/dL — ABNORMAL HIGH (ref 8–23)
CO2: 29 mmol/L (ref 22–32)
Calcium: 8.2 mg/dL — ABNORMAL LOW (ref 8.9–10.3)
Chloride: 103 mmol/L (ref 98–111)
Creatinine, Ser: 1.54 mg/dL — ABNORMAL HIGH (ref 0.44–1.00)
GFR, Estimated: 36 mL/min — ABNORMAL LOW (ref >60)
Glucose, Bld: 200 mg/dL — ABNORMAL HIGH (ref 70–99)
Potassium: 4.1 mmol/L (ref 3.5–5.1)
Sodium: 140 mmol/L (ref 135–145)

## 2024-06-16 LAB — HEMOGLOBIN A1C
Hgb A1c MFr Bld: 7.3 % — ABNORMAL HIGH (ref 4.8–5.6)
Mean Plasma Glucose: 162.81 mg/dL

## 2024-06-16 LAB — ECHOCARDIOGRAM COMPLETE
AR max vel: 1.73 cm2
AV Area VTI: 1.87 cm2
AV Area mean vel: 1.74 cm2
AV Mean grad: 8.8 mmHg
AV Peak grad: 17 mmHg
Ao pk vel: 2.06 m/s
Area-P 1/2: 3.01 cm2
Calc EF: 72.2 %
Height: 66 in
S' Lateral: 3.3 cm
Single Plane A2C EF: 76.2 %
Single Plane A4C EF: 67.7 %
Weight: 5280 [oz_av]

## 2024-06-16 MED ORDER — PERFLUTREN LIPID MICROSPHERE
1.0000 mL | INTRAVENOUS | Status: AC | PRN
Start: 2024-06-16 — End: 2024-06-16
  Administered 2024-06-16: 2 mL via INTRAVENOUS

## 2024-06-16 MED ORDER — CYCLOBENZAPRINE HCL 10 MG PO TABS
10.0000 mg | ORAL_TABLET | Freq: Three times a day (TID) | ORAL | Status: DC | PRN
  Administered 2024-06-16 – 2024-06-20 (×7): 10 mg via ORAL
  Filled 2024-06-16 (×7): qty 1

## 2024-06-16 MED ORDER — INSULIN GLARGINE 100 UNIT/ML ~~LOC~~ SOLN
55.0000 [IU] | Freq: Every day | SUBCUTANEOUS | Status: DC
  Administered 2024-06-17 – 2024-06-20 (×4): 55 [IU] via SUBCUTANEOUS
  Filled 2024-06-16 (×4): qty 0.55

## 2024-06-16 MED ORDER — IPRATROPIUM-ALBUTEROL 0.5-2.5 (3) MG/3ML IN SOLN
3.0000 mL | Freq: Four times a day (QID) | RESPIRATORY_TRACT | Status: DC | PRN
  Filled 2024-06-16: qty 3

## 2024-06-16 MED ORDER — INSULIN ASPART 100 UNIT/ML IJ SOLN
10.0000 [IU] | INTRAMUSCULAR | Status: AC
  Administered 2024-06-16: 10 [IU] via SUBCUTANEOUS

## 2024-06-16 MED ORDER — INSULIN GLARGINE 100 UNIT/ML ~~LOC~~ SOLN
15.0000 [IU] | Freq: Once | SUBCUTANEOUS | Status: AC
  Administered 2024-06-16: 15 [IU] via SUBCUTANEOUS
  Filled 2024-06-16: qty 0.15

## 2024-06-16 MED ORDER — BACLOFEN 10 MG PO TABS
10.0000 mg | ORAL_TABLET | Freq: Three times a day (TID) | ORAL | Status: DC | PRN
  Administered 2024-06-16 – 2024-06-20 (×4): 10 mg via ORAL
  Filled 2024-06-16 (×4): qty 1

## 2024-06-16 MED ORDER — FUROSEMIDE 10 MG/ML IJ SOLN
40.0000 mg | Freq: Every day | INTRAMUSCULAR | Status: DC
  Administered 2024-06-16 – 2024-06-19 (×3): 40 mg via INTRAVENOUS
  Filled 2024-06-16 (×4): qty 4

## 2024-06-16 NOTE — Progress Notes (Signed)
   06/16/24 2145  BiPAP/CPAP/SIPAP  BiPAP/CPAP/SIPAP Pt Type Adult  BiPAP/CPAP/SIPAP DREAMSTATIOND  Mask Type Full face mask  Dentures removed? Not applicable  Mask Size Medium  Respiratory Rate 18 breaths/min  PEEP 8 cmH20  Flow Rate 2 lpm  Patient Home Machine No  Patient Home Mask No  Patient Home Tubing No  Auto Titrate No  Device Plugged into RED Power Outlet Yes  BiPAP/CPAP /SiPAP Vitals  SpO2 100 %

## 2024-06-16 NOTE — Progress Notes (Signed)
 Progress Note    Anita James   FMW:[id number]  DOB: January 04, 1951  DOA: 06/14/2024     1 PCP: Clotilda Risen, NP  Initial CC: SOB, swelling  Hospital Course: Anita James is a 73 y.o. female with medical history significant of dCHF, T2DM, CKD, Class III obesity presented to [name] ED with complaints of worsening extremity edema, shortness of breath, abdominal distention and admitted to [facility] for CHF exacerbation.   Patient states over the last week she has been having more shortness of breath and ankle swelling.  She was on Lasix  but has not been taking it as she thinks it harms her kidneys. She states she has exertional dyspnea but denies orthopnea.    ED Course: Patient seen in [facility] ED and admitted to [facility].  D-dimer was elevated so DVT and PE study were done and they were negative.  Patient was given IV Lasix .  She reports some response to that.  Lab work otherwise showed normal CBC, slight elevation of creatinine and slightly elevated troponin that was flat and without any associated EKG changes.  BNP that was slightly elevated especially compared to her baseline. [facility] contacted for admission.  Assessment/Plan:   Acute on chronic dCHF exacerbation: Patient with mild to moderate heart failure exacerbation with symptoms of dyspnea and worsening swelling along with elevation in BNP.  She was status post IV Lasix .  Patient with last echo in March 2025 showing normal EF but impaired laxation. - Echo repeated on admission showing EF 60 to 65%, no RWMA, mild LVH, grade 1 DD - continue lasix  - trend BMP  OSA: restart home CPAP   Chronic pain syndrome:  - Patient endorsed being on chronic opioids but chart review shows she has only been getting intermittent opioid for pain.   - Okay for Norco for now -Continue Lyrica   Muscle spasms/cramping - Trial of Flexeril  and baclofen  - monitoring electrolytes   Gout: Chronic continue home allopurinol    Class III obesity: Will need  to discuss GLP 1 agonist as her prior heart failure, OSA, chronic pain as well.   Reactive airway disease: Sees Dr. Gene with [facility] pulmonology.  Continue home inhalers.  Lung sounds unremarkable.   Hypothyroidism:  Continue Synthroid .   T2DM - A1c 7.1% on admission - Adjusting Lantus  as necessary - Continue SSI and CBG monitoring - Patient wearing CGM  Interval History:  Breathing a little bit better this morning.  On room air.  Still nervous to go home without feeling much better. We discussed insulin  regimen some along with pain regimen some.   Old records reviewed in assessment of this patient  Antimicrobials:   DVT prophylaxis:  Lovenox    Code Status:   Code Status: Full Code  Mobility Assessment (Last 72 Hours)     Mobility Assessment     Row Name 06/16/24 0833 06/15/24 1324         Does the patient have exclusion criteria? No - Perform mobility assessment No - Perform mobility assessment      What is the highest level of mobility based on the mobility assessment? Level 3 (Stands with assistance) - Balance while standing  and cannot march in place Level 3 (Stands with assistance) - Balance while standing  and cannot march in place      Is the above level different from baseline mobility prior to current illness? Yes - Recommend PT order Yes - Recommend PT order         Barriers to discharge:  None Disposition Plan: Home HH orders placed: N/A Status is: Inpatient  Objective: Blood pressure (!) 144/79, pulse 77, temperature 98 F (36.7 C), temperature source Oral, resp. rate 20, height 5' 6 (1.676 m), weight (!) 149.7 kg, SpO2 92%.  Examination:  Physical Exam Constitutional:      Appearance: Normal appearance.  HENT:     Head: Normocephalic and atraumatic.     Mouth/Throat:     Mouth: Mucous membranes are moist.  Eyes:     Extraocular Movements: Extraocular movements intact.  Cardiovascular:     Rate and Rhythm: Normal rate and regular rhythm.   Pulmonary:     Effort: Pulmonary effort is normal. No respiratory distress.     Breath sounds: Normal breath sounds. No wheezing.  Abdominal:     General: Bowel sounds are normal. There is no distension.     Palpations: Abdomen is soft.     Tenderness: There is no abdominal tenderness.  Musculoskeletal:        General: Normal range of motion.     Cervical back: Normal range of motion and neck supple.  Skin:    General: Skin is warm and dry.  Neurological:     General: No focal deficit present.     Mental Status: She is alert.  Psychiatric:        Mood and Affect: Mood normal.      Consultants:    Procedures:    Data Reviewed: Results for orders placed or performed during the hospital encounter of 06/14/24 (from the past 24 hours)  Glucose, capillary     Status: Abnormal   Collection Time: 06/15/24  6:13 PM  Result Value Ref Range   Glucose-Capillary 217 (H) 70 - 99 mg/dL  Hemoglobin J8r     Status: Abnormal   Collection Time: 06/15/24  7:15 PM  Result Value Ref Range   Hgb A1c MFr Bld 7.1 (H) 4.8 - 5.6 %   Mean Plasma Glucose 157.07 mg/dL  CBC     Status: None   Collection Time: 06/15/24  7:15 PM  Result Value Ref Range   WBC 7.1 4.0 - 10.5 K/uL   RBC 4.56 3.87 - 5.11 MIL/uL   Hemoglobin 13.9 12.0 - 15.0 g/dL   HCT 57.8 63.9 - 53.9 %   MCV 92.3 80.0 - 100.0 fL   MCH 30.5 26.0 - 34.0 pg   MCHC 33.0 30.0 - 36.0 g/dL   RDW 86.1 88.4 - 84.4 %   Platelets 168 150 - 400 K/uL   nRBC 0.0 0.0 - 0.2 %  Glucose, capillary     Status: Abnormal   Collection Time: 06/15/24  9:17 PM  Result Value Ref Range   Glucose-Capillary 203 (H) 70 - 99 mg/dL  Basic metabolic panel     Status: Abnormal   Collection Time: 06/16/24  1:49 AM  Result Value Ref Range   Sodium 140 135 - 145 mmol/L   Potassium 4.1 3.5 - 5.1 mmol/L   Chloride 103 98 - 111 mmol/L   CO2 29 22 - 32 mmol/L   Glucose, Bld 200 (H) 70 - 99 mg/dL   BUN 24 (H) 8 - 23 mg/dL   Creatinine, Ser 8.45 (H) 0.44 - 1.00  mg/dL   Calcium  8.2 (L) 8.9 - 10.3 mg/dL   GFR, Estimated 36 (L) >60 mL/James   Anion gap 8 5 - 15  Glucose, capillary     Status: Abnormal   Collection Time: 06/16/24  6:11 AM  Result  Value Ref Range   Glucose-Capillary 222 (H) 70 - 99 mg/dL  Glucose, capillary     Status: Abnormal   Collection Time: 06/16/24  9:51 AM  Result Value Ref Range   Glucose-Capillary 278 (H) 70 - 99 mg/dL  Glucose, capillary     Status: Abnormal   Collection Time: 06/16/24 10:50 AM  Result Value Ref Range   Glucose-Capillary 256 (H) 70 - 99 mg/dL    I have reviewed pertinent nursing notes, vitals, labs, and images as necessary. I have ordered labwork to follow up on as indicated.  I have reviewed the last notes from staff over past 24 hours. I have discussed patient's care plan and test results with nursing staff, CM/SW, and other staff as appropriate.  Time spent: Greater than 50% of the 55 minute visit was spent in counseling/coordination of care for the patient as laid out in the A&P.   LOS: 1 day   Alm Apo, MD [facility] 06/16/2024, 2:40 PM

## 2024-06-16 NOTE — Hospital Course (Addendum)
 Anita James is a 73 y.o. female with medical history significant of dCHF, T2DM, CKD, Class III obesity presented to [facility] ED with complaints of worsening extremity edema, shortness of breath, abdominal distention and admitted to [facility] for CHF exacerbation.   Patient states over the last week she has been having more shortness of breath and ankle swelling.  She was on Lasix  but has not been taking it as she thinks it harms her kidneys. She states she has exertional dyspnea but denies orthopnea.    ED Course: Patient seen in [facility] and admitted to [facility].  D-dimer was elevated so DVT and PE study were done and they were negative.  Patient was given IV Lasix .  She reports some response to that.  Lab work otherwise showed normal CBC, slight elevation of creatinine and slightly elevated troponin that was flat and without any associated EKG changes.  BNP that was slightly elevated especially compared to her baseline. TRH contacted for admission.  Assessment/Plan:   Acute on chronic dCHF exacerbation: Patient with mild to moderate heart failure exacerbation with symptoms of dyspnea and worsening swelling along with elevation in BNP.  She was status post IV Lasix .  Patient with last echo in March 2025 showing normal EF but impaired laxation. - Echo repeated on admission showing EF 60 to 65%, no RWMA, mild LVH, grade 1 DD - continue lasix ; patient declining doses - trend BMP  Left hip pain - Patient describing shooting pain from left ankle to groin which sounds somewhat like her underlying neuropathic pains and chronic pains.  However, she is adamant pain in her left hip is new and worsened with diuresis.  Some suspicion for osteoarthritis given age and body habitus - Pursuing further workup with x-ray for now - Have informed her no escalation of pain treatment without organic cause - Continue Norco, baclofen , Flexeril   OSA: restart home CPAP   Chronic pain syndrome:  - Patient  endorsed being on chronic opioids but chart review shows she has only been getting intermittent opioid for pain.   - Okay for Norco for now -Continue Lyrica ; renally dosed  Muscle spasms/cramping - Trial of Flexeril  and baclofen  - monitoring electrolytes   Gout: Chronic continue home allopurinol    Class III obesity: Will need to discuss GLP 1 agonist as her prior heart failure, OSA, chronic pain as well.   Reactive airway disease: Sees Dr. Gene with [facility] pulmonology.  Continue home inhalers.  Lung sounds unremarkable.   Hypothyroidism:  Continue Synthroid .   T2DM - A1c 7.1% on admission - Adjusting Lantus  as necessary - Continue SSI and CBG monitoring - Patient wearing CGM

## 2024-06-16 NOTE — Progress Notes (Signed)
  Echocardiogram 2D Echocardiogram has been performed.  Norleen ORN [facility] 06/16/2024, 9:58 AM

## 2024-06-16 NOTE — Progress Notes (Signed)
 Patient has been resting with cpap on this afternoon. Patient woken up to speak with case manager and upon awaking pt appears to be drowsy. Patient able to open eyes and responds to questions but will then close her eyes and try to go back to bed. Dr. Patsy notified. STAT ABG ordered and respiratory was notified. After ABG stick, pt more alert and is requesting to go to bedside commode. Patient up to [facility] and voided 800ccs of yellow urine. Patient sitting on edge of bed, waiting for her dinner tray to come.

## 2024-06-16 NOTE — Plan of Care (Signed)

## 2024-06-16 NOTE — TOC Initial Note (Addendum)
 Transition of Care [facility]) - Initial/Assessment Note    Patient Details  Name: Anita James MRN: [id number] Date of Birth: May 17, 1951  Transition [facility] CM/SW Contact:    Waddell Barnie Rama, RN Phone Number: 06/16/2024, 4:33 PM  Clinical Narrative:                 From home with oldest daughter , Dorrinder,, has PCP and insurance on file, states has no HH services in place at this time , has cpap and walker at home.  Family member will transport them home at dc and family is support system, states gets medications [facility] [address]  Pta self ambulatory with walker.   Daughters are involved in her care, Dorrinder is listed as a daughter we can speak to regarding patient.  Expected Discharge Plan: Home/Self Care Barriers to Discharge: Continued Medical Work up   Patient Goals and CMS Choice Patient states their goals for this hospitalization and ongoing recovery are:: home with daughter   Choice offered to / list presented to : NA      Expected Discharge Plan and Services In-house Referral: NA Discharge Planning Services: CM Consult Post Acute Care Choice: NA Living arrangements for the past 2 months: Single Family Home                 DME Arranged: N/A DME Agency: NA       HH Arranged: NA          Prior Living Arrangements/Services Living arrangements for the past 2 months: Single Family Home Lives with:: Adult Children Patient language and need for interpreter reviewed:: Yes Do you feel safe going back to the place where you live?: Yes      Need for Family Participation in Patient Care: Yes (Comment) Care giver support system in place?: Yes (comment) Current home services: DME (cpap, walker) Criminal Activity/Legal Involvement Pertinent to Current Situation/Hospitalization: No - Comment as needed  Activities of Daily Living      Permission Sought/Granted Permission sought to share information with : Case Manager                Emotional  Assessment       Orientation: : Oriented to Self, Oriented to Place, Oriented to  Time, Oriented to Situation   Psych Involvement: No (comment)  Admission diagnosis:  Acute CHF (congestive heart failure) (HCC) [I50.9] Acute on chronic diastolic congestive heart failure (HCC) [I50.33] Patient Active Problem List   Diagnosis Date Noted   Acute CHF (congestive heart failure) (HCC) 06/15/2024   Numbness and tingling of left upper extremity 11/22/2023   Peripheral neuropathy 11/22/2023   Type 2 diabetes mellitus treated with insulin  (HCC) 11/22/2023   Urinary incontinence 11/22/2023   Elevated lactic acid level 11/22/2023   Chronic gout 11/22/2023   Reactive airway disease 11/22/2023   Osteopenia of neck of left femur 05/09/2020   Chondrocalcinosis 04/29/2020   OA (osteoarthritis) of knee 04/15/2020   Right hand pain 04/15/2020   OSA on CPAP 05/19/2017   Morbid obesity due to excess calories (HCC) 03/17/2017   Neuropathic pain 11/04/2016   Carpal tunnel syndrome 06/14/2014   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   Cervical radiculopathy at C8 06/05/2014   Hyperlipemia 09/12/2013   Chronic diastolic CHF (congestive heart failure) (HCC) 09/12/2013   Major depression 09/12/2013   Allergic rhinitis 06/22/2013   Chronic pain syndrome 05/31/2013   Gastroesophageal reflux disease without esophagitis 08/30/2012   Essential hypertension    Hypothyroidism  Type 2 diabetes, uncontrolled, with neuropathy    PCP:  Shannon, Jaimie, NP Pharmacy:   [id number] [address], [place] - [address] [place] [address] Phone: [phone] Fax: [phone]  [place] [facility] [address] [place] [address] Phone: [phone] Fax: [phone]     Social Drivers of Health (SDOH) Social History: SDOH Screenings   Food Insecurity: No Food Insecurity (06/16/2024)  Housing: Low Risk  (06/16/2024)  Transportation Needs: No  Transportation Needs (06/16/2024)  Utilities: Not At Risk (06/16/2024)  Depression (PHQ2-9): Low Risk  (10/21/2018)  Social Connections: Socially Isolated (06/16/2024)  Tobacco Use: Low Risk  (06/14/2024)   SDOH Interventions:     Readmission Risk Interventions     No data to display

## 2024-06-17 ENCOUNTER — Inpatient Hospital Stay (HOSPITAL_COMMUNITY)

## 2024-06-17 DIAGNOSIS — M25552 Pain in left hip: Secondary | ICD-10-CM | POA: Diagnosis not present

## 2024-06-17 DIAGNOSIS — I5032 Chronic diastolic (congestive) heart failure: Secondary | ICD-10-CM | POA: Diagnosis not present

## 2024-06-17 DIAGNOSIS — G894 Chronic pain syndrome: Secondary | ICD-10-CM | POA: Diagnosis not present

## 2024-06-17 LAB — CBC WITH DIFFERENTIAL/PLATELET
Abs Immature Granulocytes: 0.02 K/uL (ref 0.00–0.07)
Basophils Absolute: 0 K/uL (ref 0.0–0.1)
Basophils Relative: 0 %
Eosinophils Absolute: 0.1 K/uL (ref 0.0–0.5)
Eosinophils Relative: 1 %
HCT: 37.4 % (ref 36.0–46.0)
Hemoglobin: 12.8 g/dL (ref 12.0–15.0)
Immature Granulocytes: 0 %
Lymphocytes Relative: 21 %
Lymphs Abs: 1.2 K/uL (ref 0.7–4.0)
MCH: 30.8 pg (ref 26.0–34.0)
MCHC: 34.2 g/dL (ref 30.0–36.0)
MCV: 89.9 fL (ref 80.0–100.0)
Monocytes Absolute: 0.8 K/uL (ref 0.1–1.0)
Monocytes Relative: 13 %
Neutro Abs: 3.8 K/uL (ref 1.7–7.7)
Neutrophils Relative %: 65 %
Platelets: 186 K/uL (ref 150–400)
RBC: 4.16 MIL/uL (ref 3.87–5.11)
RDW: 13.4 % (ref 11.5–15.5)
WBC: 5.9 K/uL (ref 4.0–10.5)
nRBC: 0 % (ref 0.0–0.2)

## 2024-06-17 LAB — BASIC METABOLIC PANEL WITH GFR
Anion gap: 11 (ref 5–15)
BUN: 26 mg/dL — ABNORMAL HIGH (ref 8–23)
CO2: 29 mmol/L (ref 22–32)
Calcium: 8.3 mg/dL — ABNORMAL LOW (ref 8.9–10.3)
Chloride: 99 mmol/L (ref 98–111)
Creatinine, Ser: 1.41 mg/dL — ABNORMAL HIGH (ref 0.44–1.00)
GFR, Estimated: 40 mL/min — ABNORMAL LOW (ref >60)
Glucose, Bld: 167 mg/dL — ABNORMAL HIGH (ref 70–99)
Potassium: 4 mmol/L (ref 3.5–5.1)
Sodium: 139 mmol/L (ref 135–145)

## 2024-06-17 LAB — GLUCOSE, CAPILLARY
Glucose-Capillary: 155 mg/dL — ABNORMAL HIGH (ref 70–99)
Glucose-Capillary: 221 mg/dL — ABNORMAL HIGH (ref 70–99)
Glucose-Capillary: 177 mg/dL — ABNORMAL HIGH (ref 70–99)
Glucose-Capillary: 202 mg/dL — ABNORMAL HIGH (ref 70–99)

## 2024-06-17 LAB — MAGNESIUM: Magnesium: 2.3 mg/dL (ref 1.7–2.4)

## 2024-06-17 MED ORDER — PREGABALIN 25 MG PO CAPS
75.0000 mg | ORAL_CAPSULE | Freq: Once | ORAL | Status: AC
  Administered 2024-06-17: 75 mg via ORAL
  Filled 2024-06-17 (×2): qty 3

## 2024-06-17 MED ORDER — PREGABALIN 100 MG PO CAPS: 300.0000 mg | ORAL_CAPSULE | Freq: Once | ORAL | Status: DC

## 2024-06-17 MED ORDER — PREGABALIN 75 MG PO CAPS
150.0000 mg | ORAL_CAPSULE | Freq: Two times a day (BID) | ORAL | Status: DC
  Administered 2024-06-17 – 2024-06-20 (×6): 150 mg via ORAL
  Filled 2024-06-17 (×6): qty 2

## 2024-06-17 MED ORDER — PREGABALIN 25 MG PO CAPS: 375.0000 mg | ORAL_CAPSULE | Freq: Every day | ORAL | Status: DC

## 2024-06-17 NOTE — Progress Notes (Signed)
 Progress Note    Anita James   FMW:[id number]  DOB: 11-14-1950  DOA: 06/14/2024     2 PCP: Clotilda Risen, NP  Initial CC: SOB, swelling  Hospital Course: Anita James is a 72 y.o. female with medical history significant of dCHF, T2DM, CKD, Class III obesity presented to [facility] ED with complaints of worsening extremity edema, shortness of breath, abdominal distention and admitted to [facility] for CHF exacerbation.   Patient states over the last week she has been having more shortness of breath and ankle swelling.  She was on Lasix  but has not been taking it as she thinks it harms her kidneys. She states she has exertional dyspnea but denies orthopnea.    ED Course: Patient seen in [facility] and admitted to [facility].  D-dimer was elevated so DVT and PE study were done and they were negative.  Patient was given IV Lasix .  She reports some response to that.  Lab work otherwise showed normal CBC, slight elevation of creatinine and slightly elevated troponin that was flat and without any associated EKG changes.  BNP that was slightly elevated especially compared to her baseline. TR[facility] contacted for admission.  Assessment/Plan:   Acute on chronic dCHF exacerbation: Patient with mild to moderate heart failure exacerbation with symptoms of dyspnea and worsening swelling along with elevation in BNP.  She was status post IV Lasix .  Patient with last echo in March 2025 showing normal EF but impaired laxation. - Echo repeated on admission showing EF 60 to 65%, no RWMA, mild LVH, grade 1 DD - continue lasix ; patient declining doses - trend BMP  Left hip pain - Patient describing shooting pain from left ankle to groin which sounds somewhat like her underlying neuropathic pains and chronic pains.  However, she is adamant pain in her left hip is new and worsened with diuresis.  Some suspicion for osteoarthritis given age and body habitus - Pursuing further workup with x-ray for now - Have informed  her no escalation of pain treatment without organic cause - Continue Norco, baclofen , Flexeril   OSA: restart home CPAP   Chronic pain syndrome:  - Patient endorsed being on chronic opioids but chart review shows she has only been getting intermittent opioid for pain.   - Okay for Norco for now -Continue Lyrica ; renally dosed  Muscle spasms/cramping - Trial of Flexeril  and baclofen  - monitoring electrolytes   Gout: Chronic continue home allopurinol    Class III obesity: Will need to discuss GLP 1 agonist as her prior heart failure, OSA, chronic pain as well.   Reactive airway disease: Sees Dr. Gene with [facility] pulmonology.  Continue home inhalers.  Lung sounds unremarkable.   Hypothyroidism:  Continue Synthroid .   T2DM - A1c 7.1% on admission - Adjusting Lantus  as necessary - Continue SSI and CBG monitoring - Patient wearing CGM  Interval History:  Mostly complaining of left hip pain this morning.  Still has shooting pain from ankle to groin which was present prior to admission. States that she feels left hip pain is worse with Lasix  doses.   Old records reviewed in assessment of this patient  Antimicrobials:   DVT prophylaxis:  Lovenox    Code Status:   Code Status: Full Code  Mobility Assessment (Last 72 Hours)     Mobility Assessment     Row Name 06/17/24 0900 06/16/24 2030 06/16/24 0833 06/15/24 1324     Does the patient have exclusion criteria? No - Perform mobility assessment No - Perform mobility assessment  No - Perform mobility assessment No - Perform mobility assessment    What is the highest level of mobility based on the mobility assessment? Level 3 (Stands with assistance) - Balance while standing  and cannot march in place Level 3 (Stands with assistance) - Balance while standing  and cannot march in place Level 3 (Stands with assistance) - Balance while standing  and cannot march in place Level 3 (Stands with assistance) - Balance while standing  and  cannot march in place    Is the above level different from baseline mobility prior to current illness? Yes - Recommend PT order Yes - Recommend PT order Yes - Recommend PT order Yes - Recommend PT order       Barriers to discharge: None Disposition Plan: Home HH orders placed: N/A Status is: Inpatient  Objective: Blood pressure (!) 183/89, pulse 82, temperature 98 F (36.7 C), temperature source Oral, resp. rate (!) 21, height 5' 6 (1.676 m), weight (!) 149.7 kg, SpO2 100%.  Examination:  Physical Exam Constitutional:      Appearance: Normal appearance.  HENT:     Head: Normocephalic and atraumatic.     Mouth/Throat:     Mouth: Mucous membranes are moist.  Eyes:     Extraocular Movements: Extraocular movements intact.  Cardiovascular:     Rate and Rhythm: Normal rate and regular rhythm.  Pulmonary:     Effort: Pulmonary effort is normal. No respiratory distress.     Breath sounds: Normal breath sounds. No wheezing.  Abdominal:     General: Bowel sounds are normal. There is no distension.     Palpations: Abdomen is soft.     Tenderness: There is no abdominal tenderness.  Musculoskeletal:        General: Normal range of motion.     Cervical back: Normal range of motion and neck supple.  Skin:    General: Skin is warm and dry.  Neurological:     General: No focal deficit present.     Mental Status: She is alert.  Psychiatric:        Mood and Affect: Mood normal.      Consultants:    Procedures:    Data Reviewed: Results for orders placed or performed during the hospital encounter of 06/14/24 (from the past 24 hours)  Glucose, capillary     Status: Abnormal   Collection Time: 06/16/24  3:38 PM  Result Value Ref Range   Glucose-Capillary 184 (H) 70 - 99 mg/dL  Blood gas, arterial     Status: Abnormal   Collection Time: 06/16/24  5:20 PM  Result Value Ref Range   pH, Arterial 7.48 (H) 7.35 - 7.45   pCO2 arterial 45 32 - 48 mmHg   pO2, Arterial 112 (H) 83 - 108  mmHg   Bicarbonate 33.5 (H) 20.0 - 28.0 mmol/L   Acid-Base Excess 8.6 (H) 0.0 - 2.0 mmol/L   O2 Saturation 99.3 %   Patient temperature 36.7    Collection site RIGHT RADIAL    Drawn by [id number]    Allens test (pass/fail) PASS PASS  Glucose, capillary     Status: Abnormal   Collection Time: 06/16/24  8:53 PM  Result Value Ref Range   Glucose-Capillary 178 (H) 70 - 99 mg/dL  Basic metabolic panel with GFR     Status: Abnormal   Collection Time: 06/17/24  2:27 AM  Result Value Ref Range   Sodium 139 135 - 145 mmol/L   Potassium 4.0 3.5 -  5.1 mmol/L   Chloride 99 98 - 111 mmol/L   CO2 29 22 - 32 mmol/L   Glucose, Bld 167 (H) 70 - 99 mg/dL   BUN 26 (H) 8 - 23 mg/dL   Creatinine, Ser 8.58 (H) 0.44 - 1.00 mg/dL   Calcium  8.3 (L) 8.9 - 10.3 mg/dL   GFR, Estimated 40 (L) >60 mL/min   Anion gap 11 5 - 15  CBC with Differential/Platelet     Status: None   Collection Time: 06/17/24  2:27 AM  Result Value Ref Range   WBC 5.9 4.0 - 10.5 K/uL   RBC 4.16 3.87 - 5.11 MIL/uL   Hemoglobin 12.8 12.0 - 15.0 g/dL   HCT 62.5 63.9 - 53.9 %   MCV 89.9 80.0 - 100.0 fL   MCH 30.8 26.0 - 34.0 pg   MCHC 34.2 30.0 - 36.0 g/dL   RDW 86.5 88.4 - 84.4 %   Platelets 186 150 - 400 K/uL   nRBC 0.0 0.0 - 0.2 %   Neutrophils Relative % 65 %   Neutro Abs 3.8 1.7 - 7.7 K/uL   Lymphocytes Relative 21 %   Lymphs Abs 1.2 0.7 - 4.0 K/uL   Monocytes Relative 13 %   Monocytes Absolute 0.8 0.1 - 1.0 K/uL   Eosinophils Relative 1 %   Eosinophils Absolute 0.1 0.0 - 0.5 K/uL   Basophils Relative 0 %   Basophils Absolute 0.0 0.0 - 0.1 K/uL   Immature Granulocytes 0 %   Abs Immature Granulocytes 0.02 0.00 - 0.07 K/uL  Magnesium      Status: None   Collection Time: 06/17/24  2:27 AM  Result Value Ref Range   Magnesium  2.3 1.7 - 2.4 mg/dL  Glucose, capillary     Status: Abnormal   Collection Time: 06/17/24  6:31 AM  Result Value Ref Range   Glucose-Capillary 155 (H) 70 - 99 mg/dL  Glucose, capillary     Status:  Abnormal   Collection Time: 06/17/24 10:54 AM  Result Value Ref Range   Glucose-Capillary 221 (H) 70 - 99 mg/dL    I have reviewed pertinent nursing notes, vitals, labs, and images as necessary. I have ordered labwork to follow up on as indicated.  I have reviewed the last notes from staff over past 24 hours. I have discussed patient's care plan and test results with nursing staff, CM/SW, and other staff as appropriate.  Time spent: Greater than 50% of the 55 minute visit was spent in counseling/coordination of care for the patient as laid out in the A&P.   LOS: 2 days   Alm Apo, MD [facility] 06/17/2024, 1:13 PM

## 2024-06-17 NOTE — Progress Notes (Signed)
   06/17/24 2343  BiPAP/CPAP/SIPAP  $ Non-Invasive Home Ventilator  Subsequent  BiPAP/CPAP/SIPAP DREAMSTATIOND  Mask Type Full face mask  Dentures removed? Not applicable  Mask Size Medium  Respiratory Rate 18 breaths/min  PEEP 8 cmH20  Flow Rate 3 lpm  Patient Home Machine No  Patient Home Mask No  Patient Home Tubing No  Auto Titrate No  Device Plugged into RED Power Outlet Yes

## 2024-06-17 NOTE — Plan of Care (Signed)
  Problem: Education: Goal: Ability to describe self-care measures that may prevent or decrease complications (Diabetes Survival Skills Education) will improve Outcome: Progressing   Problem: Coping: Goal: Ability to adjust to condition or change in health will improve Outcome: Progressing   Problem: Fluid Volume: Goal: Ability to maintain a balanced intake and output will improve Outcome: Progressing   Problem: Cardiac: Goal: Ability to achieve and maintain adequate cardiopulmonary perfusion will improve Outcome: Progressing   Problem: Activity: Goal: Capacity to carry out activities will improve Outcome: Progressing   Problem: Education: Goal: Ability to verbalize understanding of medication therapies will improve Outcome: Progressing

## 2024-06-18 DIAGNOSIS — I5032 Chronic diastolic (congestive) heart failure: Secondary | ICD-10-CM | POA: Diagnosis not present

## 2024-06-18 DIAGNOSIS — I5031 Acute diastolic (congestive) heart failure: Secondary | ICD-10-CM | POA: Diagnosis not present

## 2024-06-18 LAB — CBC WITH DIFFERENTIAL/PLATELET
Abs Immature Granulocytes: 0.02 K/uL (ref 0.00–0.07)
Basophils Absolute: 0 K/uL (ref 0.0–0.1)
Basophils Relative: 0 %
Eosinophils Absolute: 0.1 K/uL (ref 0.0–0.5)
Eosinophils Relative: 1 %
HCT: 36.9 % (ref 36.0–46.0)
Hemoglobin: 12.5 g/dL (ref 12.0–15.0)
Immature Granulocytes: 0 %
Lymphocytes Relative: 22 %
Lymphs Abs: 1.3 K/uL (ref 0.7–4.0)
MCH: 30.6 pg (ref 26.0–34.0)
MCHC: 33.9 g/dL (ref 30.0–36.0)
MCV: 90.4 fL (ref 80.0–100.0)
Monocytes Absolute: 1 K/uL (ref 0.1–1.0)
Monocytes Relative: 17 %
Neutro Abs: 3.5 K/uL (ref 1.7–7.7)
Neutrophils Relative %: 60 %
Platelets: 176 K/uL (ref 150–400)
RBC: 4.08 MIL/uL (ref 3.87–5.11)
RDW: 13.4 % (ref 11.5–15.5)
WBC: 5.9 K/uL (ref 4.0–10.5)
nRBC: 0 % (ref 0.0–0.2)

## 2024-06-18 LAB — BASIC METABOLIC PANEL WITH GFR
Anion gap: 11 (ref 5–15)
BUN: 24 mg/dL — ABNORMAL HIGH (ref 8–23)
CO2: 25 mmol/L (ref 22–32)
Calcium: 8.3 mg/dL — ABNORMAL LOW (ref 8.9–10.3)
Chloride: 102 mmol/L (ref 98–111)
Creatinine, Ser: 1.36 mg/dL — ABNORMAL HIGH (ref 0.44–1.00)
GFR, Estimated: 42 mL/min — ABNORMAL LOW (ref >60)
Glucose, Bld: 198 mg/dL — ABNORMAL HIGH (ref 70–99)
Potassium: 3.8 mmol/L (ref 3.5–5.1)
Sodium: 138 mmol/L (ref 135–145)

## 2024-06-18 LAB — MAGNESIUM: Magnesium: 2.3 mg/dL (ref 1.7–2.4)

## 2024-06-18 LAB — GLUCOSE, CAPILLARY
Glucose-Capillary: 223 mg/dL — ABNORMAL HIGH (ref 70–99)
Glucose-Capillary: 246 mg/dL — ABNORMAL HIGH (ref 70–99)
Glucose-Capillary: 169 mg/dL — ABNORMAL HIGH (ref 70–99)
Glucose-Capillary: 163 mg/dL — ABNORMAL HIGH (ref 70–99)

## 2024-06-18 MED ORDER — MELATONIN 3 MG PO TABS
3.0000 mg | ORAL_TABLET | Freq: Every evening | ORAL | Status: DC | PRN
  Administered 2024-06-18: 3 mg via ORAL
  Filled 2024-06-18: qty 1

## 2024-06-18 NOTE — Progress Notes (Signed)
 Patient placed on CPAP for the night with 3L bled in.

## 2024-06-18 NOTE — Plan of Care (Signed)
  Problem: Fluid Volume: Goal: Ability to maintain a balanced intake and output will improve Outcome: Progressing   Problem: Coping: Goal: Ability to adjust to condition or change in health will improve Outcome: Progressing   Problem: Metabolic: Goal: Ability to maintain appropriate glucose levels will improve Outcome: Not Progressing   Problem: Nutritional: Goal: Maintenance of adequate nutrition will improve Outcome: Progressing Goal: Progress toward achieving an optimal weight will improve Outcome: Progressing

## 2024-06-18 NOTE — Progress Notes (Addendum)
 Progress Note    [name]   FMW:[id number]  DOB: 1950/11/27  DOA: 06/14/2024     3 PCP: Shannon, Jaimie, NP  Initial CC: SOB, swelling  Hospital Course: Anita James is a 73 y.o. female with medical history significant of dCHF, T2DM, CKD, Class III obesity presented to [facility] ED with complaints of worsening extremity edema, shortness of breath, abdominal distention and admitted to [facility] for CHF exacerbation.   Patient states over the last week she has been having more shortness of breath and ankle swelling.  She was on Lasix  but has not been taking it as she thinks it harms her kidneys. She states she has exertional dyspnea but denies orthopnea.    ED Course: Patient seen in [facility] ED and admitted to [facility].  D-dimer was elevated so DVT and PE study were done and they were negative.  Patient was given IV Lasix .  She reports some response to that.  Lab work otherwise showed normal CBC, slight elevation of creatinine and slightly elevated troponin that was flat and without any associated EKG changes.  BNP that was slightly elevated especially compared to her baseline. [facility] contacted for admission.  Assessment/Plan:   Acute on chronic dCHF exacerbation: Patient with mild to moderate heart failure exacerbation with symptoms of dyspnea and worsening swelling along with elevation in BNP.  She was status post IV Lasix .  Patient with last echo in March 2025 showing normal EF but impaired laxation. - Echo repeated on admission showing EF 60 to 65%, no RWMA, mild LVH, grade 1 DD - continue lasix ; patient declining doses intermittently  - trend BMP  Left hip pain - Patient describing shooting pain from left ankle to groin which sounds somewhat like her underlying neuropathic pains and chronic pains.  However, she is adamant pain in her left hip is new and worsened with diuresis.  Some suspicion for osteoarthritis given age and body habitus - X-ray unremarkable on 06/17/2024 - Have  informed her no escalation of pain treatment without organic cause - Continue Norco, baclofen , Flexeril   OSA: restart home CPAP   Chronic pain syndrome:  - Patient endorsed being on chronic opioids but chart review shows she has only been getting intermittent opioid for pain.   - Okay for Norco for now -Continue Lyrica ; renally dosed  Muscle spasms/cramping - Trial of Flexeril  and baclofen  - monitoring electrolytes   Gout: Chronic continue home allopurinol    Class III obesity: Will need to discuss GLP 1 agonist as her prior heart failure, OSA, chronic pain as well.   Reactive airway disease: Sees Dr. Gene with [facility] pulmonology.  Continue home inhalers.  Lung sounds unremarkable.   Hypothyroidism:  Continue Synthroid .   T2DM - A1c 7.1% on admission - Adjusting Lantus  as necessary - Continue SSI and CBG monitoring - Patient wearing CGM  Interval History:  Left hip much better this morning.  Also took her Lasix  this morning.   Old records reviewed in assessment of this patient  Antimicrobials:   DVT prophylaxis:  Lovenox    Code Status:   Code Status: Full Code  Mobility Assessment (Last 72 Hours)     Mobility Assessment     Row Name 06/17/24 2030 06/17/24 0900 06/16/24 2030 06/16/24 0833     Does the patient have exclusion criteria? No - Perform mobility assessment No - Perform mobility assessment No - Perform mobility assessment No - Perform mobility assessment    What is the highest level of mobility based on the  mobility assessment? Level 4 (Ambulates with assistance) - Balance while stepping forward/back - Complete Level 3 (Stands with assistance) - Balance while standing  and cannot march in place Level 3 (Stands with assistance) - Balance while standing  and cannot march in place Level 3 (Stands with assistance) - Balance while standing  and cannot march in place    Is the above level different from baseline mobility prior to current illness? Yes - Recommend  PT order Yes - Recommend PT order Yes - Recommend PT order Yes - Recommend PT order       Barriers to discharge: None Disposition Plan: Home HH orders placed: N/A Status is: Inpatient  Objective: Blood pressure (!) 155/80, pulse 68, temperature 97.8 F (36.6 C), temperature source Axillary, resp. rate 20, height 5' 6 (1.676 m), weight (!) 149.7 kg, SpO2 96%.  Examination:  Physical Exam Constitutional:      Appearance: Normal appearance.  HENT:     Head: Normocephalic and atraumatic.     Mouth/Throat:     Mouth: Mucous membranes are moist.  Eyes:     Extraocular Movements: Extraocular movements intact.  Cardiovascular:     Rate and Rhythm: Normal rate and regular rhythm.  Pulmonary:     Effort: Pulmonary effort is normal. No respiratory distress.     Breath sounds: Normal breath sounds. No wheezing.  Abdominal:     General: Bowel sounds are normal. There is no distension.     Palpations: Abdomen is soft.     Tenderness: There is no abdominal tenderness.  Musculoskeletal:        General: Normal range of motion.     Cervical back: Normal range of motion and neck supple.  Skin:    General: Skin is warm and dry.  Neurological:     General: No focal deficit present.     Mental Status: She is alert.  Psychiatric:        Mood and Affect: Mood normal.      Consultants:    Procedures:    Data Reviewed: Results for orders placed or performed during the hospital encounter of 06/14/24 (from the past 24 hours)  Glucose, capillary     Status: Abnormal   Collection Time: 06/17/24  4:31 PM  Result Value Ref Range   Glucose-Capillary 177 (H) 70 - 99 mg/dL  Glucose, capillary     Status: Abnormal   Collection Time: 06/17/24  8:59 PM  Result Value Ref Range   Glucose-Capillary 202 (H) 70 - 99 mg/dL  Basic metabolic panel with GFR     Status: Abnormal   Collection Time: 06/18/24  2:10 AM  Result Value Ref Range   Sodium 138 135 - 145 mmol/L   Potassium 3.8 3.5 - 5.1  mmol/L   Chloride 102 98 - 111 mmol/L   CO2 25 22 - 32 mmol/L   Glucose, Bld 198 (H) 70 - 99 mg/dL   BUN 24 (H) 8 - 23 mg/dL   Creatinine, Ser 8.63 (H) 0.44 - 1.00 mg/dL   Calcium  8.3 (L) 8.9 - 10.3 mg/dL   GFR, Estimated 42 (L) >60 mL/min   Anion gap 11 5 - 15  CBC with Differential/Platelet     Status: None   Collection Time: 06/18/24  2:10 AM  Result Value Ref Range   WBC 5.9 4.0 - 10.5 K/uL   RBC 4.08 3.87 - 5.11 MIL/uL   Hemoglobin 12.5 12.0 - 15.0 g/dL   HCT 63.0 63.9 - 53.9 %  MCV 90.4 80.0 - 100.0 fL   MCH 30.6 26.0 - 34.0 pg   MCHC 33.9 30.0 - 36.0 g/dL   RDW 86.5 88.4 - 84.4 %   Platelets 176 150 - 400 K/uL   nRBC 0.0 0.0 - 0.2 %   Neutrophils Relative % 60 %   Neutro Abs 3.5 1.7 - 7.7 K/uL   Lymphocytes Relative 22 %   Lymphs Abs 1.3 0.7 - 4.0 K/uL   Monocytes Relative 17 %   Monocytes Absolute 1.0 0.1 - 1.0 K/uL   Eosinophils Relative 1 %   Eosinophils Absolute 0.1 0.0 - 0.5 K/uL   Basophils Relative 0 %   Basophils Absolute 0.0 0.0 - 0.1 K/uL   Immature Granulocytes 0 %   Abs Immature Granulocytes 0.02 0.00 - 0.07 K/uL  Magnesium      Status: None   Collection Time: 06/18/24  2:10 AM  Result Value Ref Range   Magnesium  2.3 1.7 - 2.4 mg/dL  Glucose, capillary     Status: Abnormal   Collection Time: 06/18/24  6:17 AM  Result Value Ref Range   Glucose-Capillary 223 (H) 70 - 99 mg/dL  Glucose, capillary     Status: Abnormal   Collection Time: 06/18/24 11:18 AM  Result Value Ref Range   Glucose-Capillary 246 (H) 70 - 99 mg/dL    I have reviewed pertinent nursing notes, vitals, labs, and images as necessary. I have ordered labwork to follow up on as indicated.  I have reviewed the last notes from staff over past 24 hours. I have discussed patient's care plan and test results with nursing staff, CM/SW, and other staff as appropriate.  Time spent: Greater than 50% of the 55 minute visit was spent in counseling/coordination of care for the patient as laid out  in the A&P.   LOS: 3 days   Alm Apo, MD [facility] 06/18/2024, 2:45 PM

## 2024-06-18 NOTE — Plan of Care (Signed)

## 2024-06-19 ENCOUNTER — Inpatient Hospital Stay (HOSPITAL_COMMUNITY)

## 2024-06-19 DIAGNOSIS — G894 Chronic pain syndrome: Secondary | ICD-10-CM | POA: Diagnosis not present

## 2024-06-19 DIAGNOSIS — I5031 Acute diastolic (congestive) heart failure: Secondary | ICD-10-CM | POA: Diagnosis not present

## 2024-06-19 DIAGNOSIS — I5032 Chronic diastolic (congestive) heart failure: Secondary | ICD-10-CM | POA: Diagnosis not present

## 2024-06-19 LAB — GLUCOSE, CAPILLARY
Glucose-Capillary: 188 mg/dL — ABNORMAL HIGH (ref 70–99)
Glucose-Capillary: 336 mg/dL — ABNORMAL HIGH (ref 70–99)
Glucose-Capillary: 267 mg/dL — ABNORMAL HIGH (ref 70–99)
Glucose-Capillary: 150 mg/dL — ABNORMAL HIGH (ref 70–99)
Glucose-Capillary: 173 mg/dL — ABNORMAL HIGH (ref 70–99)

## 2024-06-19 LAB — CBC WITH DIFFERENTIAL/PLATELET
Abs Immature Granulocytes: 0.01 K/uL (ref 0.00–0.07)
Basophils Absolute: 0 K/uL (ref 0.0–0.1)
Basophils Relative: 0 %
Eosinophils Absolute: 0.1 K/uL (ref 0.0–0.5)
Eosinophils Relative: 1 %
HCT: 40.6 % (ref 36.0–46.0)
Hemoglobin: 14 g/dL (ref 12.0–15.0)
Immature Granulocytes: 0 %
Lymphocytes Relative: 24 %
Lymphs Abs: 1.4 K/uL (ref 0.7–4.0)
MCH: 30.8 pg (ref 26.0–34.0)
MCHC: 34.5 g/dL (ref 30.0–36.0)
MCV: 89.4 fL (ref 80.0–100.0)
Monocytes Absolute: 1 K/uL (ref 0.1–1.0)
Monocytes Relative: 16 %
Neutro Abs: 3.4 K/uL (ref 1.7–7.7)
Neutrophils Relative %: 59 %
Platelets: 182 K/uL (ref 150–400)
RBC: 4.54 MIL/uL (ref 3.87–5.11)
RDW: 13.6 % (ref 11.5–15.5)
WBC: 5.8 K/uL (ref 4.0–10.5)
nRBC: 0 % (ref 0.0–0.2)

## 2024-06-19 LAB — BASIC METABOLIC PANEL WITH GFR
Anion gap: 10 (ref 5–15)
BUN: 26 mg/dL — ABNORMAL HIGH (ref 8–23)
CO2: 29 mmol/L (ref 22–32)
Calcium: 8.7 mg/dL — ABNORMAL LOW (ref 8.9–10.3)
Chloride: 100 mmol/L (ref 98–111)
Creatinine, Ser: 1.5 mg/dL — ABNORMAL HIGH (ref 0.44–1.00)
GFR, Estimated: 37 mL/min — ABNORMAL LOW (ref >60)
Glucose, Bld: 187 mg/dL — ABNORMAL HIGH (ref 70–99)
Potassium: 4.8 mmol/L (ref 3.5–5.1)
Sodium: 139 mmol/L (ref 135–145)

## 2024-06-19 LAB — MAGNESIUM: Magnesium: 2.6 mg/dL — ABNORMAL HIGH (ref 1.7–2.4)

## 2024-06-19 NOTE — TOC Progression Note (Signed)
 Transition of Care [facility]) - Progression Note    Patient Details  Name: Anita James MRN: [id number] Date of Birth: [birth year]  [facility] CM/SW Contact  Waddell Barnie Rama, RN Phone Number: 06/19/2024, 4:25 PM  Clinical Narrative:    NCM spoke with patient offered choice for DME , she is ok with [employer] since she worked with them before.  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  She will also need Bariatric BSC and Shower chair,  NCM made referral to [facility] with [employer], he will deliver this to the patient's room.  She also needs HHPT and HHAIDE.  NCM offered choice, she has no preference,  NCM made referral to [facility] with [facility].  She is able to take referral.  Soc will begin 24 to 48 hours post dc.    Expected Discharge Plan: Home/Self Care Barriers to Discharge: Continued Medical Work up               Expected Discharge Plan and Services In-house Referral: NA Discharge Planning Services: CM Consult Post Acute Care Choice: NA Living arrangements for the past 2 months: Single Family Home                 DME Arranged: N/A DME Agency: NA       HH Arranged: NA           Social Drivers of Health (SDOH) Interventions SDOH Screenings   Food Insecurity: No Food Insecurity (06/16/2024)  Housing: Low Risk  (06/16/2024)  Transportation Needs: No Transportation Needs (06/16/2024)  Utilities: Not At Risk (06/16/2024)  Depression (PHQ2-9): Low Risk  (10/21/2018)  Social Connections: Socially Isolated (06/16/2024)  Tobacco Use: Low Risk  (06/14/2024)    Readmission Risk Interventions     No data to display

## 2024-06-19 NOTE — Progress Notes (Signed)
 Progress Note    Anita James   FMW:[id number]  DOB: 25-Mar-1951  DOA: 06/14/2024     4 PCP: Shannon, Jaimie, NP  Initial CC: SOB, swelling  Hospital Course: Anita James is a 73 y.o. female with medical history significant of dCHF, T2DM, CKD, Class III obesity presented to [facility] ED with complaints of worsening extremity edema, shortness of breath, abdominal distention and admitted to [facility] for CHF exacerbation.   Patient states over the last week she has been having more shortness of breath and ankle swelling.  She was on Lasix  but has not been taking it as she thinks it harms her kidneys. She states she has exertional dyspnea but denies orthopnea.    ED Course: Patient seen in [facility] ED and admitted to [facility].  D-dimer was elevated so DVT and PE study were done and they were negative.  Patient was given IV Lasix .  She reports some response to that.  Lab work otherwise showed normal CBC, slight elevation of creatinine and slightly elevated troponin that was flat and without any associated EKG changes.  BNP that was slightly elevated especially compared to her baseline. [facility] contacted for admission.  Assessment/Plan:   Acute on chronic dCHF exacerbation: Patient with mild to moderate heart failure exacerbation with symptoms of dyspnea and worsening swelling along with elevation in BNP.  She was status post IV Lasix .  Patient with last echo in March 2025 showing normal EF but impaired laxation. - Echo repeated on admission showing EF 60 to 65%, no RWMA, mild LVH, grade 1 DD - continue lasix ; patient declining doses intermittently  - trend BMP  Left hip pain - Patient describing shooting pain from left ankle to groin which sounds somewhat like her underlying neuropathic pains and chronic pains.  However, she is adamant pain in her left hip is new and worsened with diuresis.  Some suspicion for osteoarthritis given age and body habitus - X-ray unremarkable on 06/17/2024 - Have  informed her no escalation of pain treatment without organic cause - Continue Norco, baclofen , Flexeril   Left ankle pain - Same complaint as above; nothing concerning on exam - Imaging being obtained per patient request  OSA: restart home CPAP   Chronic pain syndrome - Patient endorsed being on chronic opioids but chart review shows she has only been getting intermittent opioid for pain.   - Okay for Norco for now - Continue Lyrica ; renally dosed  Muscle spasms/cramping - Trial of Flexeril  and baclofen  - monitoring electrolytes   Gout: Chronic continue home allopurinol    Class III obesity: Will need to discuss GLP 1 agonist as her prior heart failure, OSA, chronic pain as well.   Reactive airway disease: Sees Dr. Gene with [facility] pulmonology.  Continue home inhalers.  Lung sounds unremarkable.   Hypothyroidism:  Continue Synthroid .   T2DM - A1c 7.1% on admission - Adjusting Lantus  as necessary - Continue SSI and CBG monitoring - Patient wearing CGM  Interval History:  Now complaining of pain in left ankle and unable to ambulate effectively.  We discussed getting PT to evaluate and imaging of left ankle for reassurance.   Old records reviewed in assessment of this patient  Antimicrobials:   DVT prophylaxis:  Lovenox    Code Status:   Code Status: Full Code  Mobility Assessment (Last 72 Hours)     Mobility Assessment     Row Name 06/18/24 1940 06/18/24 0910 06/17/24 2030 06/17/24 0900 06/16/24 2030   Does the patient have exclusion criteria?  No - Perform mobility assessment No - Perform mobility assessment No - Perform mobility assessment No - Perform mobility assessment No - Perform mobility assessment   What is the highest level of mobility based on the mobility assessment? Level 4 (Ambulates with assistance) - Balance while stepping forward/back - Complete Level 4 (Ambulates with assistance) - Balance while stepping forward/back - Complete Level 4 (Ambulates  with assistance) - Balance while stepping forward/back - Complete Level 3 (Stands with assistance) - Balance while standing  and cannot march in place Level 3 (Stands with assistance) - Balance while standing  and cannot march in place   Is the above level different from baseline mobility prior to current illness? Yes - Recommend PT order Yes - Recommend PT order Yes - Recommend PT order Yes - Recommend PT order Yes - Recommend PT order      Barriers to discharge: None Disposition Plan: Home HH orders placed: N/A Status is: Inpatient  Objective: Blood pressure 139/80, pulse 91, temperature (!) 97.5 F (36.4 C), temperature source Oral, resp. rate 18, height 5' 6 (1.676 m), weight (!) 151 kg, SpO2 94%.  Examination:  Physical Exam Constitutional:      Appearance: Normal appearance.  HENT:     Head: Normocephalic and atraumatic.     Mouth/Throat:     Mouth: Mucous membranes are moist.  Eyes:     Extraocular Movements: Extraocular movements intact.  Cardiovascular:     Rate and Rhythm: Normal rate and regular rhythm.  Pulmonary:     Effort: Pulmonary effort is normal. No respiratory distress.     Breath sounds: Normal breath sounds. No wheezing.  Abdominal:     General: Bowel sounds are normal. There is no distension.     Palpations: Abdomen is soft.     Tenderness: There is no abdominal tenderness.  Musculoskeletal:        General: Normal range of motion.     Cervical back: Normal range of motion and neck supple.  Skin:    General: Skin is warm and dry.  Neurological:     General: No focal deficit present.     Mental Status: She is alert.  Psychiatric:        Mood and Affect: Mood normal.      Consultants:    Procedures:    Data Reviewed: Results for orders placed or performed during the hospital encounter of 06/14/24 (from the past 24 hours)  Glucose, capillary     Status: Abnormal   Collection Time: 06/18/24  4:12 PM  Result Value Ref Range   Glucose-Capillary  169 (H) 70 - 99 mg/dL  Glucose, capillary     Status: Abnormal   Collection Time: 06/18/24  9:45 PM  Result Value Ref Range   Glucose-Capillary 163 (H) 70 - 99 mg/dL  Glucose, capillary     Status: Abnormal   Collection Time: 06/19/24  5:56 AM  Result Value Ref Range   Glucose-Capillary 188 (H) 70 - 99 mg/dL  Basic metabolic panel with GFR     Status: Abnormal   Collection Time: 06/19/24  7:25 AM  Result Value Ref Range   Sodium 139 135 - 145 mmol/L   Potassium 4.8 3.5 - 5.1 mmol/L   Chloride 100 98 - 111 mmol/L   CO2 29 22 - 32 mmol/L   Glucose, Bld 187 (H) 70 - 99 mg/dL   BUN 26 (H) 8 - 23 mg/dL   Creatinine, Ser 8.49 (H) 0.44 - 1.00 mg/dL  Calcium  8.7 (L) 8.9 - 10.3 mg/dL   GFR, Estimated 37 (L) >60 mL/min   Anion gap 10 5 - 15  CBC with Differential/Platelet     Status: None   Collection Time: 06/19/24  7:25 AM  Result Value Ref Range   WBC 5.8 4.0 - 10.5 K/uL   RBC 4.54 3.87 - 5.11 MIL/uL   Hemoglobin 14.0 12.0 - 15.0 g/dL   HCT 59.3 63.9 - 53.9 %   MCV 89.4 80.0 - 100.0 fL   MCH 30.8 26.0 - 34.0 pg   MCHC 34.5 30.0 - 36.0 g/dL   RDW 86.3 88.4 - 84.4 %   Platelets 182 150 - 400 K/uL   nRBC 0.0 0.0 - 0.2 %   Neutrophils Relative % 59 %   Neutro Abs 3.4 1.7 - 7.7 K/uL   Lymphocytes Relative 24 %   Lymphs Abs 1.4 0.7 - 4.0 K/uL   Monocytes Relative 16 %   Monocytes Absolute 1.0 0.1 - 1.0 K/uL   Eosinophils Relative 1 %   Eosinophils Absolute 0.1 0.0 - 0.5 K/uL   Basophils Relative 0 %   Basophils Absolute 0.0 0.0 - 0.1 K/uL   Immature Granulocytes 0 %   Abs Immature Granulocytes 0.01 0.00 - 0.07 K/uL  Magnesium      Status: Abnormal   Collection Time: 06/19/24  7:25 AM  Result Value Ref Range   Magnesium  2.6 (H) 1.7 - 2.4 mg/dL  Glucose, capillary     Status: Abnormal   Collection Time: 06/19/24  8:43 AM  Result Value Ref Range   Glucose-Capillary 336 (H) 70 - 99 mg/dL  Glucose, capillary     Status: Abnormal   Collection Time: 06/19/24 11:16 AM  Result  Value Ref Range   Glucose-Capillary 267 (H) 70 - 99 mg/dL    I have reviewed pertinent nursing notes, vitals, labs, and images as necessary. I have ordered labwork to follow up on as indicated.  I have reviewed the last notes from staff over past 24 hours. I have discussed patient's care plan and test results with nursing staff, CM/SW, and other staff as appropriate.  Time spent: Greater than 50% of the 55 minute visit was spent in counseling/coordination of care for the patient as laid out in the A&P.   LOS: 4 days   Alm Apo, MD [facility] 06/19/2024, 12:29 PM

## 2024-06-19 NOTE — Plan of Care (Signed)
  Problem: Coping: Goal: Ability to adjust to condition or change in health will improve Outcome: Progressing   Problem: Fluid Volume: Goal: Ability to maintain a balanced intake and output will improve Outcome: Progressing   Problem: Health Behavior/Discharge Planning: Goal: Ability to identify and utilize available resources and services will improve Outcome: Progressing Goal: Ability to manage health-related needs will improve Outcome: Progressing   Problem: Clinical Measurements: Goal: Ability to maintain clinical measurements within normal limits will improve Outcome: Progressing Goal: Will remain free from infection Outcome: Progressing Goal: Diagnostic test results will improve Outcome: Progressing Goal: Respiratory complications will improve Outcome: Progressing Goal: Cardiovascular complication will be avoided Outcome: Progressing

## 2024-06-19 NOTE — Evaluation (Signed)
 Physical Therapy Evaluation Patient Details Name: Anita James MRN: [id number] DOB: [birth year] Today's Date: 06/19/2024  History of Present Illness  Patient is a 72 y/o female admitted 06/14/24 with SOB and LE edema and abdominal distention.  Completing diuresis for CHF exacerbation and developed L hip & ankle pain.  PMH positive for CHF, DM, obesity, gout, hypothyroid, reactive airway disease, OSA on CPAP, neuropathy and urinary incontinence.  Clinical Impression  Patient presents with decreased mobility due to pain, limited activity tolerance, decreased strength, and decreased balance.  She was previously independent with her rollator with daughter working during the day.  Currently min A for mobility in the room and with pain in bilateral hips and esp L leg to ankle.  She will benefit from skilled PT in the acute setting and from HHPT at d/c.       If plan is discharge home, recommend the following: A little help with walking and/or transfers;Assist for transportation;Help with stairs or ramp for entrance   Can travel by private vehicle        Equipment Recommendations Rollator (4 wheels);BSC/3in1;Other (comment) (shower chair)  Recommendations for Other Services       Functional Status Assessment Patient has had a recent decline in their functional status and demonstrates the ability to make significant improvements in function in a reasonable and predictable amount of time.     Precautions / Restrictions Precautions Precautions: Fall Precaution/Restrictions Comments: watch O2      Mobility  Bed Mobility Overal bed mobility: Needs Assistance Bed Mobility: Supine to Sit     Supine to sit: Supervision, Used rails, HOB elevated     General bed mobility comments: increased time, effortful    Transfers Overall transfer level: Needs assistance   Transfers: Sit to/from Stand, Bed to chair/wheelchair/BSC Sit to Stand: Min assist     Squat pivot transfers: Min assist      General transfer comment: to [facility] from EOB A for balance, cues for hand placement, to stand from recliner using momentum with rollator. assist for balance, anterior weight shift    Ambulation/Gait Ambulation/Gait assistance: Supervision, Contact guard assist Gait Distance (Feet): 20 Feet (x 2) Assistive device: Rollator (4 wheels), Rolling walker (2 wheels) Gait Pattern/deviations: Step-through pattern, Step-to pattern, Trunk flexed       General Gait Details: flexed posture with both RW and rollator, her rollator without brakes, states she has tried to get it fixed and tried to get a new one without success  Careers information officer     Tilt Bed    Modified Rankin (Stroke Patients Only)       Balance Overall balance assessment: Needs assistance   Sitting balance-Leahy Scale: Good     Standing balance support: Single extremity supported, Bilateral upper extremity supported Standing balance-Leahy Scale: Poor Standing balance comment: reliant on UE support for balance esp with transitions                             Pertinent Vitals/Pain Pain Assessment Pain Assessment: Faces Faces Pain Scale: Hurts even more Pain Location: both hips, L knee, lower leg Pain Descriptors / Indicators: Aching, Discomfort, Spasm, Sore Pain Intervention(s): Monitored during session, Repositioned, Heat applied, Limited activity within patient's tolerance    Home Living Family/patient expects to be discharged to:: Private residence Living Arrangements: Children (daughter) Available Help at Discharge: Family Type of Home: Apartment Home Access:  Level entry (walks around to back)       Home Layout: One level Home Equipment: Rollator (4 wheels);Grab bars - tub/shower Additional Comments: rollator    Prior Function Prior Level of Function : Needs assist             Mobility Comments: uses rollator, reports daughter works and can get around independent  at home, uses motorized cart in store, some assist for bed at times ADLs Comments: notes she bathes & dresses without assistance, cleans & cooks while sitting on rollator.     Extremity/Trunk Assessment   Upper Extremity Assessment Upper Extremity Assessment: Generalized weakness    Lower Extremity Assessment Lower Extremity Assessment: RLE deficits/detail;LLE deficits/detail RLE Deficits / Details: AAROM WFL, strength hip flexion 2/5, knee extension 3-/5, ankle AROM WFL, denies numbness in supine but states can't feel floor under her feet LLE Deficits / Details: AAROM WFL, strength hip flexion 2/5, knee extension 3-/5, ankle AROM WFL, denies numbness in supine but states can't feel floor under her feet    Cervical / Trunk Assessment Cervical / Trunk Assessment: Kyphotic  Communication   Communication Communication: No apparent difficulties    Cognition Arousal: Lethargic Behavior During Therapy: WFL for tasks assessed/performed   PT - Cognitive impairments: No apparent impairments                         Following commands: Intact       Cueing       General Comments General comments (skin integrity, edema, etc.): SpO2 93-95% with ambulation on RA; initially sleeping, doffed mask from CPAP to arouse and get pt to engage    Exercises     Assessment/Plan    PT Assessment Patient needs continued PT services  PT Problem List Decreased strength;Decreased activity tolerance;Decreased balance;Decreased mobility;Pain       PT Treatment Interventions DME instruction;Gait training;Patient/family education;Functional mobility training;Therapeutic activities;Therapeutic exercise    PT Goals (Current goals can be found in the Care Plan section)  Acute Rehab PT Goals Patient Stated Goal: return home with services PT Goal Formulation: With patient Time For Goal Achievement: 07/03/24 Potential to Achieve Goals: Good    Frequency Min 2X/week     Co-evaluation                AM-PAC PT 6 Clicks Mobility  Outcome Measure Help needed turning from your back to your side while in a flat bed without using bedrails?: A Little Help needed moving from lying on your back to sitting on the side of a flat bed without using bedrails?: A Little Help needed moving to and from a bed to a chair (including a wheelchair)?: A Little Help needed standing up from a chair using your arms (e.g., wheelchair or bedside chair)?: A Little Help needed to walk in hospital room?: A Little Help needed climbing 3-5 steps with a railing? : A Lot 6 Click Score: 17    End of Session Equipment Utilized During Treatment: Gait belt Activity Tolerance: Patient limited by fatigue;Patient limited by pain Patient left: in chair;with call bell/phone within reach   PT Visit Diagnosis: Other abnormalities of gait and mobility (R26.89);Muscle weakness (generalized) (M62.81);Pain Pain - Right/Left: Left Pain - part of body: Hip;Ankle and joints of foot    Time: 1110-1205 PT Time Calculation (min) (ACUTE ONLY): 55 min   Charges:   PT Evaluation $PT Eval Moderate Complexity: 1 Mod PT Treatments $Gait Training: 23-37 mins PT General Charges $$  ACUTE PT VISIT: 1 Visit         Micheline Portal, PT Acute Rehabilitation Services [phone] 06/19/2024   Montie Portal 06/19/2024, 2:56 PM

## 2024-06-20 ENCOUNTER — Other Ambulatory Visit (HOSPITAL_COMMUNITY): Payer: Self-pay

## 2024-06-20 DIAGNOSIS — G894 Chronic pain syndrome: Secondary | ICD-10-CM | POA: Diagnosis not present

## 2024-06-20 DIAGNOSIS — I5032 Chronic diastolic (congestive) heart failure: Secondary | ICD-10-CM | POA: Diagnosis not present

## 2024-06-20 LAB — CBC WITH DIFFERENTIAL/PLATELET
Abs Immature Granulocytes: 0.02 K/uL (ref 0.00–0.07)
Basophils Absolute: 0 K/uL (ref 0.0–0.1)
Basophils Relative: 0 %
Eosinophils Absolute: 0.1 K/uL (ref 0.0–0.5)
Eosinophils Relative: 1 %
HCT: 36.1 % (ref 36.0–46.0)
Hemoglobin: 12.6 g/dL (ref 12.0–15.0)
Immature Granulocytes: 0 %
Lymphocytes Relative: 17 %
Lymphs Abs: 1.2 K/uL (ref 0.7–4.0)
MCH: 31.5 pg (ref 26.0–34.0)
MCHC: 34.9 g/dL (ref 30.0–36.0)
MCV: 90.3 fL (ref 80.0–100.0)
Monocytes Absolute: 1 K/uL (ref 0.1–1.0)
Monocytes Relative: 14 %
Neutro Abs: 4.6 K/uL (ref 1.7–7.7)
Neutrophils Relative %: 68 %
Platelets: 170 K/uL (ref 150–400)
RBC: 4 MIL/uL (ref 3.87–5.11)
RDW: 13.7 % (ref 11.5–15.5)
WBC: 6.9 K/uL (ref 4.0–10.5)
nRBC: 0 % (ref 0.0–0.2)

## 2024-06-20 LAB — GLUCOSE, CAPILLARY
Glucose-Capillary: 258 mg/dL — ABNORMAL HIGH (ref 70–99)
Glucose-Capillary: 290 mg/dL — ABNORMAL HIGH (ref 70–99)

## 2024-06-20 LAB — BASIC METABOLIC PANEL WITH GFR
Anion gap: 11 (ref 5–15)
BUN: 35 mg/dL — ABNORMAL HIGH (ref 8–23)
CO2: 27 mmol/L (ref 22–32)
Calcium: 8.1 mg/dL — ABNORMAL LOW (ref 8.9–10.3)
Chloride: 97 mmol/L — ABNORMAL LOW (ref 98–111)
Creatinine, Ser: 2.13 mg/dL — ABNORMAL HIGH (ref 0.44–1.00)
GFR, Estimated: 24 mL/min — ABNORMAL LOW (ref >60)
Glucose, Bld: 250 mg/dL — ABNORMAL HIGH (ref 70–99)
Potassium: 4.1 mmol/L (ref 3.5–5.1)
Sodium: 135 mmol/L (ref 135–145)

## 2024-06-20 LAB — MAGNESIUM: Magnesium: 2.3 mg/dL (ref 1.7–2.4)

## 2024-06-20 MED ORDER — BACLOFEN 10 MG PO TABS
10.0000 mg | ORAL_TABLET | Freq: Three times a day (TID) | ORAL | 0 refills | Status: AC | PRN
  Filled 2024-06-20: qty 30, 10d supply, fill #0

## 2024-06-20 MED ORDER — TORSEMIDE 20 MG PO TABS: 20.0000 mg | ORAL_TABLET | ORAL | Status: AC

## 2024-06-20 MED ORDER — CYCLOBENZAPRINE HCL 10 MG PO TABS
10.0000 mg | ORAL_TABLET | Freq: Three times a day (TID) | ORAL | 0 refills | Status: AC | PRN
  Filled 2024-06-20: qty 30, 10d supply, fill #0

## 2024-06-20 NOTE — Progress Notes (Signed)
   06/20/24 0024  BiPAP/CPAP/SIPAP  $ Non-Invasive Ventilator  Non-Invasive Vent Subsequent  BiPAP/CPAP/SIPAP Pt Type Adult  BiPAP/CPAP/SIPAP Resmed  Mask Type Full face mask  Mask Size Medium  PEEP 8 cmH20  Flow Rate 3 lpm  Patient Home Machine No  Patient Home Mask No  Patient Home Tubing No  Auto Titrate No  BiPAP/CPAP /SiPAP Vitals  Pulse Rate 93  Resp 18  SpO2 98 %  Bilateral Breath Sounds Clear;Diminished  MEWS Score/Color  MEWS Score 0  MEWS Score Color Landy

## 2024-06-20 NOTE — Discharge Summary (Addendum)
 Physician Discharge Summary   Anita James FMW:[id number] DOB: 1951/02/03 DOA: 06/14/2024  PCP: Shannon, Jaimie, NP  Admit date: 06/14/2024 Discharge date: 06/20/2024  Admitted From: Home Disposition:  Home Discharging physician: Alm Apo, MD Barriers to discharge: none  Recommendations at discharge: Repeat BMP  Discharge Condition: stable CODE STATUS: Full  Diet recommendation:  Diet Orders (From admission, onward)     Start     Ordered   06/20/24 0000  Diet - low sodium heart healthy        06/20/24 1030   06/20/24 0000  Diet Carb Modified        06/20/24 1030   06/16/24 0929  Diet heart healthy/carb modified Room service appropriate? Yes; Fluid consistency: Thin  Diet effective now       Question Answer Comment  Diet-HS Snack? Nothing   Room service appropriate? Yes   Fluid consistency: Thin      06/16/24 0928            Hospital Course: Anita James is a 73 y.o. female with medical history significant of dCHF, T2DM, CKD, Class III obesity presented to [facility] ED with complaints of worsening extremity edema, shortness of breath, abdominal distention and admitted to [facility] for CHF exacerbation.   Patient states over the last week she has been having more shortness of breath and ankle swelling.  She was on Lasix  but has not been taking it as she thinks it harms her kidneys. She states she has exertional dyspnea but denies orthopnea.    ED Course: Patient seen in [facility] ED and admitted to [facility].  D-dimer was elevated so DVT and PE study were done and they were negative.  Patient was given IV Lasix .  She reports some response to that.  Lab work otherwise showed normal CBC, slight elevation of creatinine and slightly elevated troponin that was flat and without any associated EKG changes.  BNP that was slightly elevated especially compared to her baseline. [facility] contacted for admission.  Assessment/Plan:   Acute on chronic dCHF exacerbation: Patient with  mild to moderate heart failure exacerbation with symptoms of dyspnea and worsening swelling along with elevation in BNP.  She was status post IV Lasix .  Patient with last echo in March 2025 showing normal EF but impaired laxation. - Echo repeated on admission showing EF 60 to 65%, no RWMA, mild LVH, grade 1 DD - continue lasix ; patient declining doses intermittently  - Transitioned back to home dose torsemide  at discharge  CKD3a - patient has history of CKD3a. Baseline creat ~ 1.1 - 1.2, eGFR~ 46-50 - she takes torsemide  intermittently at home and did same with lasix  here inpt - resumed on home torsemide  at discharge - mild bump in creat on diuresis; outpatient repeat BMP - continue following with nephrology   Left hip pain - Patient describing shooting pain from left ankle to groin which sounds somewhat like her underlying neuropathic pains and chronic pains.  However, she is adamant pain in her left hip is new and worsened with diuresis.  Some suspicion for osteoarthritis given age and body habitus - X-ray unremarkable on 06/17/2024 - Have informed her no escalation of pain treatment without organic cause - Continue Norco, baclofen , Flexeril   Left ankle pain - Same complaint as above; nothing concerning on exam - Patient requested x-ray as well; no acute findings.  Small plantar calcaneal spur, vascular calcifications, mild soft tissue edema  OSA: home CPAP   Chronic pain syndrome - Patient endorsed being on  chronic opioids but chart review shows she has only been getting intermittent opioid for pain.   - Okay for Norco for now - Continue Lyrica ; renally dosed  Muscle spasms/cramping - Trial of Flexeril  and baclofen  - monitoring electrolytes   Gout: Chronic continue home allopurinol    Class III obesity: Will need to discuss GLP 1 agonist as her prior heart failure, OSA, chronic pain as well.   Reactive airway disease: Sees Dr. Gene with [facility] pulmonology.  Continue home  inhalers.  Lung sounds unremarkable.   Hypothyroidism:  Continue Synthroid .   T2DM - A1c 7.1% on admission - Adjusting Lantus  as necessary - Continue SSI and CBG monitoring - Patient wearing CGM   The patient's acute and chronic medical conditions were treated accordingly. On day of discharge, patient was felt deemed stable for discharge. Patient/family member advised to call PCP or come back to ER if needed.   Principal Diagnosis: Chronic diastolic CHF (congestive heart failure) (HCC)  Discharge Diagnoses: Active Hospital Problems   Diagnosis Date Noted   Chronic diastolic CHF (congestive heart failure) (HCC) 09/12/2013   Acute CHF (congestive heart failure) (HCC) 06/15/2024   Type 2 diabetes mellitus treated with insulin  (HCC) 11/22/2023   Hyperlipemia 09/12/2013   Chronic pain syndrome 05/31/2013   Essential hypertension    Hypothyroidism     Resolved Hospital Problems  No resolved problems to display.     Discharge Instructions     Diet - low sodium heart healthy   Complete by: As directed    Diet Carb Modified   Complete by: As directed    Increase activity slowly   Complete by: As directed       Allergies as of 06/20/2024       Reactions   Amoxicillin-pot Clavulanate Nausea And Vomiting, Other (See Comments)   Combination of medications tear lining of stomach Other Reaction(s): GI Intolerance, Not available   Oxycodone  Shortness Of Breath   oxycontin  too   Penicillins Anaphylaxis   Sulfa Antibiotics Shortness Of Breath   Norvasc  [amlodipine  Besylate] Swelling   feet   Sulfamethoxazole Nausea Only, Other (See Comments)   Other Reaction(s): GI Intolerance   Tramadol  Other (See Comments)   Unknown, toxicity. Patient advised she had to be given Narcan with this   Amoxicillin Rash        Medication List     STOP taking these medications    hydrochlorothiazide  12.5 MG capsule Commonly known as: MICROZIDE        TAKE these medications     Accu-Chek Aviva Plus w/Device Kit USE AS DIRECTED TO TEST TWICE DAILY   Accu-Chek FastClix Lancets Misc USE AS DIRECTED TO CHECK BLOOD SUGAR THREE TIMES DAILY   Accu-Chek Guide test strip Generic drug: glucose blood USE AS DIRECTED TO CHECK BLOOD SUGAR THREE TIMES DAILY   albuterol  0.63 MG/3ML nebulizer solution Commonly known as: ACCUNEB  Take 1 ampule by nebulization 3 (three) times daily as needed for wheezing or shortness of breath.   albuterol  108 (90 Base) MCG/ACT inhaler Commonly known as: VENTOLIN  HFA Inhale 2 puffs into the lungs 3 (three) times daily as needed.   allopurinol  100 MG tablet Commonly known as: ZYLOPRIM  Take 100 mg by mouth daily.   aspirin  EC 81 MG tablet Take 81 mg by mouth daily.   atorvastatin  40 MG tablet Commonly known as: LIPITOR  Take 40 mg by mouth at bedtime.   baclofen  10 MG tablet Commonly known as: LIORESAL  Take 1 tablet (10 mg total) by mouth 3 (  three) times daily as needed for muscle spasms (if no relief after flexeril ).   Blood Pressure Monitoring Kit Use daily.   Breztri  Aerosphere 160-9-4.8 MCG/ACT Aero inhaler Generic drug: budesonide -glycopyrrolate -formoterol  Inhale 2 puffs into the lungs in the morning and at bedtime.   carvedilol  25 MG tablet Commonly known as: COREG  Take 25 mg by mouth 2 (two) times daily.   cetirizine  10 MG tablet Commonly known as: ZYRTEC  Take 10 mg by mouth daily as needed for allergies.   CRANBERRY PO Take 1 tablet by mouth daily.   cyclobenzaprine  10 MG tablet Commonly known as: FLEXERIL  Take 1 tablet (10 mg total) by mouth 3 (three) times daily as needed for muscle spasms.   diclofenac  sodium 1 % Gel Commonly known as: VOLTAREN  Apply 4 g topically 4 (four) times daily. What changed:  when to take this reasons to take this   feeding supplement (GLUCERNA SHAKE) Liqd Take 237 mLs by mouth every morning.   fluticasone  50 MCG/ACT nasal spray Commonly known as: FLONASE  PLACE 2 SPRAYS IN  EACH NOSTRIL DAILY AS NEEDED FOR ALLERGIES   FreeStyle Libre 3 Plus Sensor Misc SMARTSIG:Every 3 Days   HYDROcodone -acetaminophen  5-325 MG tablet Commonly known as: NORCO/VICODIN Take 2 tablets by mouth every 4 (four) hours as needed.   Insulin  Pen Needle 32G X 4 MM Misc Commonly known as: TRUEplus Pen Needles Use to inject insulin .   NovoFine 32G X 6 MM Misc Generic drug: Insulin  Pen Needle SMARTSIG:1 SUB-Q 4-5 Times Daily   Jardiance  10 MG Tabs tablet Generic drug: empagliflozin  Take 10 mg by mouth daily.   Lantus  SoloStar 100 UNIT/ML Solostar Pen Generic drug: insulin  glargine Inject 84 Units into the skin daily. What changed: how much to take   levothyroxine  100 MCG tablet Commonly known as: Synthroid  Take 1 tablet (100 mcg total) by mouth daily.   lidocaine  5 % Commonly known as: Lidoderm  Place 1 patch onto the skin every 12 (twelve) hours. Remove & Discard patch within 12 hours or as directed by MD What changed:  when to take this reasons to take this additional instructions   NovoLOG  FlexPen 100 UNIT/ML FlexPen Generic drug: insulin  aspart Inject 60 Units into the skin 3 (three) times daily with meals. What changed:  how much to take when to take this   nystatin  powder Generic drug: nystatin  APPLY TOPICALLY 4 TIMES DAILY. What changed: See the new instructions.   omeprazole 20 MG capsule Commonly known as: PRILOSEC Take 20 mg by mouth daily.   pregabalin  300 MG capsule Commonly known as: LYRICA  Take 300 mg by mouth daily. What changed: Another medication with the same name was removed. Continue taking this medication, and follow the directions you see here.   Premarin vaginal cream Generic drug: conjugated estrogens Place 1 applicator vaginally daily as needed (dryness).   torsemide  20 MG tablet Commonly known as: DEMADEX  Take 1 tablet (20 mg total) by mouth every other day. If weight gain of 3-5 pounds in 24 hour period, take daily for 2-3  days   Zinc Acetate 50 MG Caps Take 1 capsule by mouth in the morning and at bedtime.               Durable Medical Equipment  (From admission, onward)           Start     Ordered   06/19/24 1627  For home use only DME Shower stool  Once       Comments: Bariatric   06/19/24  1626   06/19/24 1626  For home use only DME Bedside commode  Once       Comments: Bariatric  Question:  Patient needs a bedside commode to treat with the following condition  Answer:  Weakness   06/19/24 1626            Follow-up Information     Clotilda Risen, NP Follow up on 06/23/2024.   Specialty: Nurse Practitioner Why: 12 noon for hospital follow up Contact information: [address] [place] [address] [phone]         [facility] Follow up.   Specialty: Home Health Services Why: Agency will call you to set up apt times Contact information: [address] [place] [address] [phone]         [facility]  Follow up.   Why: Bariatric BSC and shower chair Contact information: [address] [place] [address] [phone]                Allergies  Allergen Reactions   Amoxicillin-Pot Clavulanate Nausea And Vomiting and Other (See Comments)    Combination of medications tear lining of stomach  Other Reaction(s): GI Intolerance, Not available   Oxycodone  Shortness Of Breath    oxycontin  too   Penicillins Anaphylaxis   Sulfa Antibiotics Shortness Of Breath   Norvasc  [Amlodipine  Besylate] Swelling    feet   Sulfamethoxazole Nausea Only and Other (See Comments)    Other Reaction(s): GI Intolerance   Tramadol  Other (See Comments)    Unknown, toxicity. Patient advised she had to be given Narcan with this   Amoxicillin Rash    Consultations:   Procedures:   Discharge Exam: BP 132/80 (BP Location: Left Wrist)   Pulse 84   Temp 98 F (36.7 C) (Oral)   Resp 18   Ht 5' 6 (1.676 m)   Wt (!) 151 kg    SpO2 91%   BMI 53.73 kg/m  Physical Exam Constitutional:      Appearance: Normal appearance.  HENT:     Head: Normocephalic and atraumatic.     Mouth/Throat:     Mouth: Mucous membranes are moist.  Eyes:     Extraocular Movements: Extraocular movements intact.  Cardiovascular:     Rate and Rhythm: Normal rate and regular rhythm.  Pulmonary:     Effort: Pulmonary effort is normal. No respiratory distress.     Breath sounds: Normal breath sounds. No wheezing.  Abdominal:     General: Bowel sounds are normal. There is no distension.     Palpations: Abdomen is soft.     Tenderness: There is no abdominal tenderness.  Musculoskeletal:        General: Normal range of motion.     Cervical back: Normal range of motion and neck supple.     Right lower leg: Edema present.     Left lower leg: Edema present.  Skin:    General: Skin is warm and dry.  Neurological:     General: No focal deficit present.     Mental Status: She is alert.  Psychiatric:        Mood and Affect: Mood normal.      The results of significant diagnostics from this hospitalization (including imaging, microbiology, ancillary and laboratory) are listed below for reference.   Microbiology: No results found for this or any previous visit (from the past 240 hours).   Labs: BNP (last 3 results) No results for input(s): BNP  in the last 8760 hours. Basic Metabolic Panel: Recent Labs  Lab 06/16/24 0149 06/17/24 0227 06/18/24 0210 06/19/24 0725 06/20/24 0152  NA 140 139 138 139 135  K 4.1 4.0 3.8 4.8 4.1  CL 103 99 102 100 97*  CO2 29 29 25 29 27   GLUCOSE 200* 167* 198* 187* 250*  BUN 24* 26* 24* 26* 35*  CREATININE 1.54* 1.41* 1.36* 1.50* 2.13*  CALCIUM  8.2* 8.3* 8.3* 8.7* 8.1*  MG  --  2.3 2.3 2.6* 2.3   Liver Function Tests: Recent Labs  Lab 06/14/24 2358  AST 20  ALT 13  ALKPHOS 77  BILITOT 0.4  PROT 6.0*  ALBUMIN 3.5   No results for input(s): LIPASE, AMYLASE in the last 168 hours. No  results for input(s): AMMONIA in the last 168 hours. CBC: Recent Labs  Lab 06/14/24 2358 06/15/24 1915 06/17/24 0227 06/18/24 0210 06/19/24 0725 06/20/24 0152  WBC 7.2 7.1 5.9 5.9 5.8 6.9  NEUTROABS 5.1  --  3.8 3.5 3.4 4.6  HGB 13.1 13.9 12.8 12.5 14.0 12.6  HCT 37.6 42.1 37.4 36.9 40.6 36.1  MCV 89.5 92.3 89.9 90.4 89.4 90.3  PLT 182 168 186 176 182 170   Cardiac Enzymes: Recent Labs  Lab 06/14/24 2358  CKTOTAL 119   BNP: Invalid input(s): POCBNP CBG: Recent Labs  Lab 06/19/24 1116 06/19/24 1622 06/19/24 2108 06/20/24 0547 06/20/24 1134  GLUCAP 267* 150* 173* 258* 290*   D-Dimer No results for input(s): DDIMER in the last 72 hours. Hgb A1c No results for input(s): HGBA1C in the last 72 hours. Lipid Profile No results for input(s): CHOL, HDL, LDLCALC, TRIG, CHOLHDL, LDLDIRECT in the last 72 hours. Thyroid function studies No results for input(s): TSH, T4TOTAL, T3FREE, THYROIDAB in the last 72 hours.  Invalid input(s): FREET3 Anemia work up No results for input(s): VITAMINB12, FOLATE, FERRITIN, TIBC, IRON, RETICCTPCT in the last 72 hours. Urinalysis    Component Value Date/Time   COLORURINE YELLOW 12/16/2023 1443   APPEARANCEUR CLEAR 12/16/2023 1443   APPEARANCEUR Clear 05/09/2019 1512   LABSPEC 1.015 12/16/2023 1443   LABSPEC 1.005 06/21/2013 0438   PHURINE 6.0 12/16/2023 1443   GLUCOSEU >=500 (A) 12/16/2023 1443   GLUCOSEU Negative 06/21/2013 0438   HGBUR TRACE (A) 12/16/2023 1443   BILIRUBINUR NEGATIVE 12/16/2023 1443   BILIRUBINUR Negative 05/09/2019 1512   BILIRUBINUR Negative 06/21/2013 0438   KETONESUR NEGATIVE 12/16/2023 1443   PROTEINUR >=300 (A) 12/16/2023 1443   UROBILINOGEN 0.2 08/03/2018 1707   UROBILINOGEN 0.2 12/23/2013 1408   NITRITE NEGATIVE 12/16/2023 1443   LEUKOCYTESUR NEGATIVE 12/16/2023 1443   LEUKOCYTESUR Trace 06/21/2013 0438   Sepsis Labs Recent Labs  Lab 06/17/24 0227  06/18/24 0210 06/19/24 0725 06/20/24 0152  WBC 5.9 5.9 5.8 6.9   Microbiology No results found for this or any previous visit (from the past 240 hours).  Procedures/Studies: DG Ankle Complete Left Result Date: 06/19/2024 EXAM: 3 OR MORE VIEW(S) XRAY OF THE LEFT ANKLE 06/19/2024 09:43:00 AM CLINICAL HISTORY: Left ankle pain, no known injury. COMPARISON: None available. FINDINGS: BONES AND JOINTS: Small plantar calcaneal spur. No acute fracture. No focal osseous lesion. No joint dislocation. SOFT TISSUES: Mild soft tissue edema. Vascular calcifications and scattered soft tissue phleboliths. IMPRESSION: 1. No acute osseous abnormality. 2. Mild soft tissue edema. 3. Small plantar calcaneal spur. 4. Vascular calcifications and scattered soft tissue phleboliths. Electronically signed by: Katheleen Faes MD 06/19/2024 01:48 PM EDT RP Workstation: [id number]   DG HIP UNILAT WITH PELVIS  2-3 VIEWS LEFT Result Date: 06/17/2024 CLINICAL DATA:  New left hip pain, worse with diuresis. No known injury. EXAM: DG HIP (WITH OR WITHOUT PELVIS) 2-3V LEFT COMPARISON:  Abdomen and pelvis CT dated 12/16/2023 FINDINGS: There is no evidence of hip fracture or dislocation. There is no evidence of arthropathy or other focal bone abnormality. IMPRESSION: Negative. Electronically Signed   By: Elspeth Bathe M.D.   On: 06/17/2024 14:23   ECHOCARDIOGRAM COMPLETE Result Date: 06/16/2024    ECHOCARDIOGRAM REPORT   Patient Name:   Anita James Date of Exam: 06/16/2024 Medical Rec #:  [id number]   Height:       66.0 in Accession #:    [id number]  Weight:       330.0 lb Date of Birth:  06/30/1951   BSA:          2.474 m Patient Age:    72 years    BP:           157/79 mmHg Patient Gender: F           HR:           85 bpm. Exam Location:  Inpatient Procedure: 2D Echo and Intracardiac Opacification Agent (Both Spectral and Color            Flow Doppler were utilized during procedure). Indications:    CHF  History:        Patient has prior  history of Echocardiogram examinations. CHF;                 Risk Factors:Hypertension and Diabetes.  Sonographer:    Norleen Amour Referring Phys: [id number] GHALIB KHAN IMPRESSIONS  1. Left ventricular ejection fraction, by estimation, is 60 to 65%. The left ventricle has normal function. The left ventricle has no regional wall motion abnormalities. There is mild left ventricular hypertrophy. Left ventricular diastolic parameters are consistent with Grade I diastolic dysfunction (impaired relaxation).  2. Right ventricular systolic function is normal. The right ventricular size is normal. There is mildly elevated pulmonary artery systolic pressure. The estimated right ventricular systolic pressure is 38.3 mmHg.  3. The mitral valve is normal in structure. No evidence of mitral valve regurgitation. No evidence of mitral stenosis.  4. The aortic valve is normal in structure. Aortic valve regurgitation is not visualized. No aortic stenosis is present.  5. The inferior vena cava is normal in size with greater than 50% respiratory variability, suggesting right atrial pressure of 3 mmHg. FINDINGS  Left Ventricle: Left ventricular ejection fraction, by estimation, is 60 to 65%. The left ventricle has normal function. The left ventricle has no regional wall motion abnormalities. The left ventricular internal cavity size was normal in size. There is  mild left ventricular hypertrophy. Left ventricular diastolic parameters are consistent with Grade I diastolic dysfunction (impaired relaxation). Right Ventricle: The right ventricular size is normal. No increase in right ventricular wall thickness. Right ventricular systolic function is normal. There is mildly elevated pulmonary artery systolic pressure. The tricuspid regurgitant velocity is 2.97  m/s, and with an assumed right atrial pressure of 3 mmHg, the estimated right ventricular systolic pressure is 38.3 mmHg. Left Atrium: Left atrial size was normal in size. Right  Atrium: Right atrial size was normal in size. Pericardium: There is no evidence of pericardial effusion. Mitral Valve: The mitral valve is normal in structure. No evidence of mitral valve regurgitation. No evidence of mitral valve stenosis. Tricuspid Valve: The tricuspid valve is normal in structure. Tricuspid valve regurgitation  is mild . No evidence of tricuspid stenosis. Aortic Valve: The aortic valve is normal in structure. Aortic valve regurgitation is not visualized. No aortic stenosis is present. Aortic valve mean gradient measures 8.8 mmHg. Aortic valve peak gradient measures 17.0 mmHg. Aortic valve area, by VTI measures 1.87 cm. Pulmonic Valve: The pulmonic valve was normal in structure. Pulmonic valve regurgitation is not visualized. No evidence of pulmonic stenosis. Aorta: The aortic root is normal in size and structure. Venous: The inferior vena cava is normal in size with greater than 50% respiratory variability, suggesting right atrial pressure of 3 mmHg. IAS/Shunts: No atrial level shunt detected by color flow Doppler.  LEFT VENTRICLE PLAX 2D LVIDd:         5.50 cm      Diastology LVIDs:         3.30 cm      LV e' medial:    7.62 cm/s LV PW:         1.20 cm      LV E/e' medial:  17.2 LV IVS:        1.30 cm      LV e' lateral:   6.09 cm/s LVOT diam:     1.90 cm      LV E/e' lateral: 21.5 LV SV:         79 LV SV Index:   32 LVOT Area:     2.84 cm  LV Volumes (MOD) LV vol d, MOD A2C: 89.5 ml LV vol d, MOD A4C: 133.0 ml LV vol s, MOD A2C: 21.3 ml LV vol s, MOD A4C: 43.0 ml LV SV MOD A2C:     68.2 ml LV SV MOD A4C:     133.0 ml LV SV MOD BP:      80.1 ml RIGHT VENTRICLE             IVC RV Basal diam:  3.50 cm     IVC diam: 1.80 cm RV S prime:     16.80 cm/s TAPSE (M-mode): 2.6 cm LEFT ATRIUM              Index        RIGHT ATRIUM           Index LA diam:        3.90 cm  1.58 cm/m   RA Area:     15.30 cm LA Vol (A2C):   110.0 ml 44.46 ml/m  RA Volume:   33.00 ml  13.34 ml/m LA Vol (A4C):   70.4 ml   28.45 ml/m LA Biplane Vol: 89.1 ml  36.01 ml/m  AORTIC VALVE                     PULMONIC VALVE AV Area (Vmax):    1.73 cm      PV Vmax:       1.42 m/s AV Area (Vmean):   1.74 cm      PV Peak grad:  8.1 mmHg AV Area (VTI):     1.87 cm AV Vmax:           206.25 cm/s AV Vmean:          138.750 cm/s AV VTI:            0.423 m AV Peak Grad:      17.0 mmHg AV Mean Grad:      8.8 mmHg LVOT Vmax:         126.00 cm/s LVOT Vmean:  85.200 cm/s LVOT VTI:          0.279 m LVOT/AV VTI ratio: 0.66  AORTA Ao Root diam: 2.80 cm MITRAL VALVE                TRICUSPID VALVE MV Area (PHT): 3.01 cm     TR Peak grad:   35.3 mmHg MV Decel Time: 252 msec     TR Vmax:        297.00 cm/s MV E velocity: 131.00 cm/s MV A velocity: 160.00 cm/s  SHUNTS MV E/A ratio:  0.82         Systemic VTI:  0.28 m                             Systemic Diam: 1.90 cm Oneil Parchment MD Electronically signed by Oneil Parchment MD Signature Date/Time: 06/16/2024/11:16:26 AM    Final    CT Angio Chest Pulmonary Embolism (PE) W or WO Contrast Result Date: 06/15/2024 EXAM: CTA of the Chest with contrast for PE 06/15/2024 03:45:44 AM TECHNIQUE: CTA of the chest was performed with the administration of 75 mL of intravenous iohexol  (OMNIPAQUE ) 350 MG/ML injection 100 mL IOHEXOL  350 MG/ML SOLN. Multiplanar reformatted images are provided for review. MIP images are provided for review. Automated exposure control, iterative reconstruction, and/or weight based adjustment of the mA/kV was utilized to reduce the radiation dose to as low as reasonably achievable. COMPARISON: 05/04/2024 CLINICAL HISTORY: Pulmonary embolism (PE) suspected, low to intermediate prob, positive D-dimer. Pt states she developed a sharp pain in her left leg that shoots up to her private area. FINDINGS: PULMONARY ARTERIES: Pulmonary arteries are adequately opacified for evaluation. No pulmonary embolism. Main pulmonary artery is normal in caliber. MEDIASTINUM: The heart demonstrates mild  cardiomegaly. There is mild thoracic aortic atherosclerosis. LYMPH NODES: No mediastinal, hilar or axillary lymphadenopathy. LUNGS AND PLEURA: Scattered mild perihilar ground glass opacities in the lungs bilaterally, possibly reflecting mild interstitial edema, less likely mosaic attenuation related to air trapping. No focal consolidation or pulmonary edema. No pleural effusion or pneumothorax. UPPER ABDOMEN: Limited images of the upper abdomen are unremarkable. SOFT TISSUES AND BONES: Mild degenerative changes of the mid/lower thoracic spine. No acute bone or soft tissue abnormality. IMPRESSION: 1. No evidence of pulmonary embolism. 2. Scattered mild perihilar ground-glass opacities, possibly reflecting mild interstitial edema versus air trapping. Electronically signed by: Pinkie Pebbles MD 06/15/2024 03:55 AM EDT RP Workstation: [id number]   US  Venous Img Lower Unilateral Left Result Date: 06/15/2024 EXAM: ULTRASOUND DUPLEX OF THE LEFT LOWER EXTREMITY VEINS TECHNIQUE: Duplex ultrasound using B-mode/gray scaled imaging and Doppler spectral analysis and color flow was obtained of the deep venous structures of the left lower extremity. COMPARISON: None. CLINICAL HISTORY: Pain, swelling. FINDINGS: The common femoral vein, femoral vein, popliteal vein, posterior tibial and gastrocnemius veins of the right lower extremity demonstrate normal compressibility with normal color flow and spectral analysis. The greater saphenous vein is patent and demonstrates appropriate antegrade flow IMPRESSION: 1. No evidence of DVT. Electronically signed by: Dorethia Molt MD 06/15/2024 12:43 AM EDT RP Workstation: [id number]   DG Chest Port 1 View Result Date: 06/15/2024 CLINICAL DATA:  Shortness of breath EXAM: PORTABLE CHEST 1 VIEW COMPARISON:  05/04/2024 FINDINGS: Cardiomegaly. No confluent airspace opacities, effusions or edema. No acute bony abnormality. IMPRESSION: Cardiomegaly.  No active disease. Electronically Signed    By: Franky Crease M.D.   On: 06/15/2024 00:07     Time coordinating  discharge: Over 30 minutes    Alm Apo, MD  [facility] 06/20/2024, 1:37 PM

## 2024-06-20 NOTE — TOC Transition Note (Signed)
 Transition of Care [facility]) - Discharge Note   Patient Details  Name: Anita James MRN: [id number] Date of Birth: 04/19/1951  Transition of Care [facility]) CM/SW Contact:  Waddell Barnie Rama, RN Phone Number: 06/20/2024, 10:56 AM   Clinical Narrative:    Patient is for dc today, [employer] to supply bariatric shower chair and bariatric bsc.  She has transportation.     Barriers to Discharge: Continued Medical Work up   Patient Goals and CMS Choice Patient states their goals for this hospitalization and ongoing recovery are:: home with daughter   Choice offered to / list presented to : NA      Discharge Placement                       Discharge Plan and Services Additional resources added to the After Visit Summary for   In-house Referral: NA Discharge Planning Services: CM Consult Post Acute Care Choice: NA          DME Arranged: N/A DME Agency: NA       HH Arranged: NA          Social Drivers of Health (SDOH) Interventions SDOH Screenings   Food Insecurity: No Food Insecurity (06/16/2024)  Housing: Low Risk  (06/16/2024)  Transportation Needs: No Transportation Needs (06/16/2024)  Utilities: Not At Risk (06/16/2024)  Depression (PHQ2-9): Low Risk  (10/21/2018)  Social Connections: Socially Isolated (06/16/2024)  Tobacco Use: Low Risk  (06/14/2024)     Readmission Risk Interventions     No data to display

## 2024-06-20 NOTE — Progress Notes (Signed)
 Discharge Nurse Summary: DC order noted per MD. DC RN at bedside with patient/daughter uneasy about the discharge plan requesting explanation regarding patient's condition and discharge instructions. Explained to patient/dtr patient primary concern for admission was CHF exacerbation in which the patient underwent diuresis d/t BLE edema/abdominal distention/SOB. Informed imaging w/o concerns per MD review.   Discussed risk with diuresis given worsening kidney function, notified MD Alm Apo dtr concerns regarding inpatient kidney function labs. Provider recommended patient/dtr follow regimen as instructed with outpatient Nephrologist Dr. Gearline. For quicker response, patient/dtr recommended to request discussion with Dr. Gearline MA to avoid delays in communication regarding treatment plan upon discharge, verbalized understanding.   AVS printed/reviewed. PIV removed, skin intact. No DME needs. No home meds. TOC meds delivered to the patient. CP/Edu resolved. Telemonitor returned to charging station. All belongings accounted for. Patient wheeled downstairs for discharge by private auto.   Rosario EMERSON Lund, RN

## 2024-06-26 ENCOUNTER — Telehealth: Payer: Self-pay | Admitting: Cardiovascular Disease

## 2024-06-26 NOTE — Telephone Encounter (Signed)
Patient would like to switch providers. Please advise

## 2024-06-28 NOTE — Telephone Encounter (Signed)
 Pt last seen in this office in 2023 by Dr Cara New referral placed in system from [place].  Looks like 3 attempts were made to schedule pt with no answer.  No Ultrasound report received from [facility]  Pt advised needs appointment with new physician in this office, and we need the ultrasound results from [facility]. Pt stated will call [facility] and ask them to send us  the ultrasound results. Pt agreeable to schedule appointment. Pt stated its hard for her to come in due to not being able to get around easy and not having rides all the time.   Pt will call if 10/14 at 1130 w/ [facility] does not work for her

## 2024-06-28 NOTE — Telephone Encounter (Signed)
 Patient is calling to follow up on ultrasound results. She states she had an ultrasound yesterday 10/7 at [facility] in [place]. She is requesting a call back with the results.

## 2024-06-29 NOTE — Progress Notes (Signed)
 GENERAL SURGERY NEW CLINIC VISIT  Patient: Anita James MRN: [id number] Date: 07/04/2024  CHIEF COMPLAINT: Ventral hernia  HISTORY OF PRESENT ILLNESS:  Anita James is a 73 y.o. female with a history of DM, OSA, HTN, HLD, GERD and CHF who presents to clinic today for evaluation of a ventral hernia.  She states that she has had a hernia present for some time now.  She believes that the bulge is getting larger and it intermittently causes discomfort.  She was recently admitted to the hospital with leg swelling and pulmonary edema.  Regarding her CHF, she had a TTE performed on 06/16/24 which revealed an LVEF of 60-65% and mild pulmonary HTN.  MEDS: Current Rx ordered in [facility][1]  PROBLEM LIST: Problem List[2]   ALLERGIES: Allergies[3]   PSH: Surgical History[4]  PMH: Medical History[5]   SOCIAL HISTORY: Social History   Socioeconomic History  . Marital status: Divorced    Spouse name: Not on file  . Number of children: Not on file  . Years of education: Not on file  . Highest education level: Not on file  Occupational History  . Not on file  Tobacco Use  . Smoking status: Never  . Smokeless tobacco: Never  Substance and Sexual Activity  . Alcohol use: No  . Drug use: No  . Sexual activity: Not on file  Other Topics Concern  . Not on file  Social History Narrative  . Not on file   Social Drivers of Health   Food Insecurity: No Food Insecurity (06/16/2024)   Received from [facility]   Food vital sign   . Within the past 12 months, you worried that your food would run out before you got money to buy more: Never true   . Within the past 12 months, the food you bought just didn't last and you didn't have money to get more: Never true  Transportation Needs: No Transportation Needs (06/16/2024)   Received from [facility] - Transportation   . In the past 12 months, has lack of transportation kept you from medical appointments or from getting medications?:  No   . In the past 12 months, has lack of transportation kept you from meetings, work, or from getting things needed for daily living?: No  Safety: Not At Risk (06/16/2024)   Received from [facility]   Safety   . Within the last year, have you been afraid of your partner or ex-partner?: No   . Within the last year, have you been humiliated or emotionally abused in other ways by your partner or ex-partner?: No   . Within the last year, have you been kicked, hit, slapped, or otherwise physically hurt by your partner or ex-partner?: No   . Within the last year, have you been raped or forced to have any kind of sexual activity by your partner or ex-partner?: No  Living Situation: Low Risk  (06/16/2024)   Received from [facility] Situation   . In the last 12 months, was there a time when you were not able to pay the mortgage or rent on time?: No   . In the past 12 months, how many times have you moved where you were living?: 0   . At any time in the past 12 months, were you homeless or living in a shelter (including now)?: No    FAMILY HISTORY: Family History[6]  ROS:  Review of systems is positive as described above and  in the HPI.  A complete point review of systems was otherwise negative.  PHYSICAL EXAMINATION: VITAL SIGNS: BP 124/79   Pulse 98   Temp 98.7 F (37.1 C) (Temporal)   Ht 1.651 m (5' 5)   Wt (!) 151 kg (331 lb 12.8 oz)   SpO2 94%   BMI 55.21 kg/m   Gen: pt sitting in chair in NAD HEENT: atraumatic, EOMI, pupils equal and round CV: regular rate and rhythm, normal S1, S2, no murmurs Pulm: normal work of breathing on room air, lungs clear bilaterally Abd: soft, large incarcerated supraumbilical hernia, non-distended, no rebound tenderness or guarding Ext: warm, well perfused, moves all extremities Neuro: GCS 15, no gross deficits Skin: no rashes, no lesions Psych: normal judgement, normal mood, normal affect  LABS:  06/15/24 Hb A1C:  7.1  06/02/24 Albumin: 3.6  I have reviewed the above labs and they are used to help determine operative risk for the patient.   IMAGING:  CT A/P (12/16/23): Moderate size supraumbilical ventral hernia is noted which contains a portion of transverse colon, but does not result in obstruction. This is mildly enlarged compared to prior exam.  Mildly heterogeneous hepatic parenchyma is noted which may simply represent artifact, but underlying neoplasm cannot be excluded. Further evaluation with ultrasound or CT scan with intravenous contrast is recommended.   ASSESSMENT/PLAN:  73 y.o. female with a large supraumbilical hernia.  We discussed that in order for her to be a candidate for surgery, she would need to lose weight first.  She will need to have a BMI less than 50 for her to be a candidate for surgery although ideally she would get down to a BMI of 40.  We will need to obtain her imaging as well to determine the exact size of her defect and to help determine the surgery should would need for a durable repair.  We discussed what eating a healthy diet entails.  We discussed eating three small meals a day, high in protein and low in carbs.  We discussed eating healthy snacks in between these meals.  We discussed avoiding empty calories such as soft drinks or sweet drinks.  We discussed drinking primarily water.  We discussed the signs and symptoms of bowel incarceration and if they develop any of these, they will seek medical attention emergently.  She will return to clinic in 6 months and we will evaluate her progress at that time.  Electronically signed by: Prentice Tanda Pizza, MD 07/04/2024 12:21 PM        [1] Meds Ordered in [facility]  Medication Sig Dispense Refill  . albuterol  0.63 mg/3 mL nebulizer solution USE 1 VIAL VIA NEBULIZER EVERY 4-6 HOURS AS NEEDED    . albuterol  HFA (PROVENTIL  HFA;VENTOLIN  HFA;PROAIR  HFA) 90 mcg/actuation inhaler USE 2 PUFFS 3 TIMES A DAY AS NEEDED FOR  SHORTNESS OF BREATH AND WHEEZE    . alcohol swabs padm Use 1 swab as needed to clean site for blood sugar check. 100 each 11  . allopurinoL  (ZYLOPRIM ) 100 mg tablet Take 100 mg by mouth daily.    . aspirin  81 mg EC tablet Take 81 mg by mouth Once Daily. 30 tablet 0  . atorvastatin  (LIPITOR ) 40 mg tablet Take 40 mg by mouth Once Daily. 30 tablet 0  . baclofen  (LIORESAL ) 10 mg tablet Take 10 mg by mouth.    . blood-glucose meter misc Use meter to check blood sugar 3 times a day.  Please dispense based on availability, insurance coverage,  and patient preference. 1 each 0  . Breztri  Aerosphere 160-9-4.8 mcg/actuation inhaler Inhale 2 puffs.    . C,E,zinc,copper 11-omega3s-lut (50 Plus Adult Eye Health) 250-5-1 mg cap Take 1 tablet by mouth 2 (two) times a day.    . calcium  carbonate-mag hydroxid (Antacid, calcium  carb-mag hyd,) 550-110 mg chew Take 1 tablet by mouth 2 (two) times a day with meals. 60 tablet 0  . carvediloL  (COREG ) 12.5 mg tablet Take 12.5 mg by mouth 2 (two) times a day. 60 tablet 3  . cetirizine  (ZyrTEC ) 10 mg tablet Take by mouth.    . cranberry 500 mg cap Take 1 capsule by mouth Once Daily.    . cyanocobalamin (VITAMIN B12) 1,000 mcg tablet Take 1,000 mcg by mouth Once Daily.    . cyclobenzaprine  (FLEXERIL ) 10 mg tablet     . dapagliflozin  propanediol (Farxiga ) 10 mg tab tablet Take 10 mg by mouth.    . dexamethasone (DECADRON) 1 mg tablet Take one tab po the night prior to testing 1 tablet 0  . dexlansoprazole  (Dexilant ) 60 mg DR capsule Take 60 mg by mouth Once Daily. 30 capsule 0  . empagliflozin  (JARDIANCE ) 10 mg tab Take 10 mg by mouth daily.    . ergocalciferol  (VITAMIN D2) 1,250 mcg (50,000 unit) capsule Take 50,000 Units by mouth.    . ezetimibe (ZETIA) 10 mg tablet Take 10 mg by mouth Once Daily.    . FreeStyle Libre 3 Plus Sensor CHANGE SENSOR EVERY 15 DAYS    . gabapentin  (NEURONTIN ) 300 mg capsule Take 600 mg by mouth 2 (two) times a day. 60 capsule 0  . ginseng 250  mg cap Take 250 mg by mouth daily.    SABRA glucose blood (Accu-Chek Guide test strips) test strip USE AS DIRECTED TO CHECK BLOOD SUGAR THREE TIMES DAILY 100 strip 3  . glucose intolerance nutritional shake (Glucerna Shake) liqd liquid Take 237 mL by mouth.    . HYDROcodone -acetaminophen  (NORCO) 5-325 mg per tablet Take 2 tablets by mouth.    . insulin  aspart U-100 (NovoLOG ) 100 unit/mL injection Inject under skin 60 units before Breakfast, Lunch and Dinner.  Hold if skips meal and do not take at bedtime 40 mL 0  . insulin  glargine (Lantus  U-100 Insulin ) 100 unit/mL injection Inject 70 Units under the skin nightly. 10 mL 1  . insulin  syr/ndl U100 half mark (BD Veo Insulin  Syr, half unit,) 0.3 mL 31 gauge x 15/64 syrg Inject 1 Syringe under the skin 3 (three) times a day. 100 Syringe 1  . Lancets (Accu-Chek Fastclix Lancet Drum) misc USE AS DIRECTED TO CHECK BLOOD SUGAR THREE TIMES DAILY 306 each 0  . levothyroxine  (SYNTHROID ) 100 mcg tablet Take 100 mcg by mouth Once Daily. 30 tablet 0  . lidocaine  (Lidoderm ) 5 % patch Place 1 patch on the skin.    . liraglutide  (Victoza  2-Pak) 0.6 mg/0.1 mL (18 mg/3 mL) injection Inject under skin 1.8 mg daily 9 mL 1  . lisinopriL  (PRINIVIL ) 40 mg tablet Take 40 mg by mouth Once Daily. 30 tablet 0  . Livalo 4 mg tab Take 1 tablet by mouth nightly.    . mometasone  (NASONEX ) 50 mcg/actuation spry spray Administer 1 spray into affected nostril(s).    SABRA omeprazole (PriLOSEC) 20 mg DR capsule Take 20 mg by mouth daily.    . pen needle, diabetic 31 gauge x 3/16 ndle INJECTS ONCE DAILY 50 each 3  . predniSONE  (DELTASONE ) 5 mg tablet Take 6 pills for first  day, 5 pills second day, 4 pills third day, 3 pills fourth day, 2 pills the fifth day, and 1 pill sixth day.    . pregabalin  (LYRICA ) 300 mg capsule Take 300 mg by mouth daily.    . Premarin 0.625 mg/gram vaginal cream Insert 1 applicator into the vagina.    . simvastatin  (ZOCOR ) 40 mg tablet Take by mouth daily.    .  spironolactone  (ALDACTONE ) 25 mg tablet TAKE 1 TABLET BY MOUTH EVERY MORNING AND 1/2 TABLET BY MOUTH EVERY EVENING    . torsemide  (DEMADEX ) 20 mg tablet Take 20 mg by mouth as needed.    . zinc acetate 50 mg (zinc) cap Take 1 capsule by mouth.     No current Epic-ordered facility-administered medications on file.  [2] Patient Active Problem List Diagnosis  . GERD (gastroesophageal reflux disease)  . Dysphagia  . Neck pain  . Altered mental status, unspecified  . 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 Nuclear sclerosis, bilateral  . Posterior subcapsular age-related cataract, right eye  . Hyperopia with presbyopia, left  . 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 Insulin  long-term use (HCC)  . Abnormal weight gain  . Essential hypertension  . CHF (congestive heart failure) (HCC)  . Morbid obesity with BMI of 45.0-49.9, adult (HCC)  . Ventral hernia without obstruction or gangrene  [3] Allergies Allergen Reactions  . Hydrocodone -Acetaminophen  Shortness Of Breath  . Oxycodone  Shortness Of Breath    oxycontin  too  . Amlodipine  Other (See Comments) and Swelling    feet  feet  feet  . Amoxicillin-Pot Clavulanate GI Intolerance  . Azithromycin  Other (See Comments)  . Sulfamethoxazole GI Intolerance  . Tramadol  Other (See Comments)    Unknown, toxicity. Patient advised she had to be given Narcan with this  [4] Past Surgical History: Procedure Laterality Date  . CESAREAN SECTION, UNSPECIFIED     Procedure: CESAREAN SECTION  [5] Past Medical History: Diagnosis Date  . Anxiety   . DM type 2 (diabetes mellitus, type 2)    (CMD)   . Essential hypertension   . GERD (gastroesophageal reflux disease)   . Hiatal hernia   . Hypothyroidism   . Peripheral  neuropathy   . Sleep apnea   [6] Family History Problem Relation Name Age of Onset  . Cancer Mother         Cervical Cancer  . Diabetes Mother    . Obesity Mother    . Diabetes Father    . Heart disease Father    . Sleep apnea Sister    . Colon cancer Neg Hx    . Esophageal cancer Neg Hx    . Stomach cancer Neg Hx

## 2024-07-14 NOTE — Progress Notes (Signed)
 New Patient Encounter:   Patient Name: Anita James Date of Birth: 01/20/1951 MRN: [id number] Today's Date: 07/14/2024  Referring MD:  Teresa Prentice Blush, MD Primary MD: Comer Angus, PA-C   Assessment, Plan and Recommendations:    Chronic fluid retention that is mostly related to morbid obesity, advanced OSA, immobility.  Appears mostly euvolemic on exam today.  Has been felt to have a component of HFpEF in the past likely exacerbated by above factors.  Multiple echocardiograms done including 1 from September 2025 which did not report any significant findings with only a grade 1 diastolic dysfunction noted.  IVC size was described as normal implying normal RA pressures.  She appears to be on good cardiac regimen including SGLT2 inhibitor.  I do not have much to add to her cardiac care at this time. Chronic ventral hernia being followed by surgery.  Given her multiple comorbidities and class III obesity, she is at increased risk for perioperative cardiac and noncardiac complications. OSA.  On CPAP. Diabetes Hypertension Peripheral neuropathy with spinal stenosis From a cardiac standpoint and given his/her underlying history of suspected heart failure in addition to her comorbidities, patient is encouraged to receive annual flu and COVID vaccines in addition to being up-to-date on his other vaccinations (pneumococcal, RSV and zoster).  Class III obesity    Chief Complaint: Edema  History of Present Illness: Anita James is a 73 y.o. old female here today to change/establish cardiac care.  She has been followed by multiple specialties including cardiology at [facility].  She has had multiple visits to the emergency room recently with different complaints.  She has chronically incarcerated ventral hernia and has been felt to be high risk for abdominal surgery.  She has been seen at [facility].  She had an echocardiogram September 2025 at [facility] which was mostly unremarkable per her report.  She  has had chronic history of peripheral edema.  She has limited mobility.  She has OSA on CPAP.  Venous scan lower extremity September 25 was negative for DVT.  She has also had several CTAs of the chest over the past couple of years.  She states that she is anticipating potential abdominal surgery although according to the notes in her chart it seems that the surgeons have recommended watchful waiting at this time due to her increased surgical risk.  No particular cardiac complaints.  She has chronic shortness of breath and chronic peripheral edema.     Previous Medical History: Medical History[1]  Social hx: Smoker: no ETOH:no Employment: no  Family Hx:  1st degree relative with CAD:  Female <55 Or Female: < 65 no    Surgical History[2]  Allergies[3]   Medications:  Current Medications[4]  REVIEW of Systems:  General: Denies any significant weight gain or weight loss in the past year. HEENT: no visual disturbances or hearing loss.  GU: No urinary frequency or hesitancy.  Endocrinology: no excessive thirst,  Musculoskeletal: No kyphosis or scoliosis. No bone deformities.  Integumentary: no skin rashes or lesions noted.  Hematology: no bleeding disorders.  Respiratory: No history of TB, Pneumonia, asthma, or bronchitis.  GI: no history of hiatal hernia or PUD. No nausea, vomiting, diarrhea, or constipation. Cardiac: as above. Psychiatry: No psychosis, no depression, no anxiety Neurology: No headaches, no seizures, no dizziness   ECG: EKG done today showed sinus rhythm, left ventricular hypertrophy, left anterior fascicular block and poor R wave progression.  No prior EKGs in our system to review  Objective: Blood pressure  138/72, pulse 86, weight (!) 148 kg (325 lb 9.6 oz), SpO2 95%.  Physical Exam:   G: Comfortable, in NAD HENT: AT/[place]: Supple, No JVD, No bruits, no masses or LN Lungs, CTA/T2P, symmetrical air movement, no rales crackles or wheezes CV: PMI non  displaced, RRR, Nl S1/S2, grade 2/6 systolic ejection murmur at the base, No H/R/T  Carotids: 2+ symmetrical ,  No bruits,   B/R: 2+ symmetrical  Pedal: Palpable bilaterally     Ext: Limited range of motion, No pitting edema, bilateral lower extremity skin changes. Neuro: A&O x3, No GFMD, appropriate affect      Rony L. Roselind, MD, CODY CHURCHES, [facility], FASE Clinical and Interventional Cardiology Hypertension Specialist    Current and old records, outside records, ECG's,radiology reports, echocardiograms, Labs, procedure notes, cath films(if any) were reviewed.    This note was generated using a voice recognition system. May contain unedited mistakes.       [1] Past Medical History: Diagnosis Date  . Anxiety   . DM type 2 (diabetes mellitus, type 2)    (CMD)   . Essential hypertension   . GERD (gastroesophageal reflux disease)   . Hiatal hernia   . Hypothyroidism   . Peripheral neuropathy   . Sleep apnea   [2] Past Surgical History: Procedure Laterality Date  . CESAREAN SECTION, UNSPECIFIED     Procedure: CESAREAN SECTION  [3] Allergies Allergen Reactions  . Hydrocodone -Acetaminophen  Shortness Of Breath  . Oxycodone  Shortness Of Breath    oxycontin  too  . Amlodipine  Other (See Comments) and Swelling    feet  feet  feet  . Amoxicillin-Pot Clavulanate GI Intolerance  . Azithromycin  Other (See Comments)  . Sulfamethoxazole GI Intolerance  . Tramadol  Other (See Comments)    Unknown, toxicity. Patient advised she had to be given Narcan with this  [4]  Current Outpatient Medications:  .  albuterol  0.63 mg/3 mL nebulizer solution, USE 1 VIAL VIA NEBULIZER EVERY 4-6 HOURS AS NEEDED, Disp: , Rfl:  .  albuterol  HFA (PROVENTIL  HFA;VENTOLIN  HFA;PROAIR  HFA) 90 mcg/actuation inhaler, USE 2 PUFFS 3 TIMES A DAY AS NEEDED FOR SHORTNESS OF BREATH AND WHEEZE, Disp: , Rfl:  .  alcohol swabs padm, Use 1 swab as needed to clean site for blood sugar check., Disp: 100 each, Rfl:  11 .  allopurinoL  (ZYLOPRIM ) 100 mg tablet, Take 100 mg by mouth daily., Disp: , Rfl:  .  aspirin  81 mg EC tablet, Take 81 mg by mouth Once Daily., Disp: 30 tablet, Rfl: 0 .  atorvastatin  (LIPITOR ) 40 mg tablet, Take 40 mg by mouth Once Daily., Disp: 30 tablet, Rfl: 0 .  blood-glucose meter misc, Use meter to check blood sugar 3 times a day.  Please dispense based on availability, insurance coverage, and patient preference., Disp: 1 each, Rfl: 0 .  Breztri  Aerosphere 160-9-4.8 mcg/actuation inhaler, Inhale 2 puffs., Disp: , Rfl:  .  C,E,zinc,copper 11-omega3s-lut (50 Plus Adult Eye Health) 250-5-1 mg cap, Take 1 tablet by mouth 2 (two) times a day., Disp: , Rfl:  .  calcium  carbonate-mag hydroxid (Antacid, calcium  carb-mag hyd,) 550-110 mg chew, Take 1 tablet by mouth 2 (two) times a day with meals., Disp: 60 tablet, Rfl: 0 .  carvediloL  (COREG ) 12.5 mg tablet, Take 12.5 mg by mouth 2 (two) times a day., Disp: 60 tablet, Rfl: 3 .  cetirizine  (ZyrTEC ) 10 mg tablet, Take by mouth., Disp: , Rfl:  .  cranberry 500 mg cap, Take 1 capsule by mouth Once  Daily., Disp: , Rfl:  .  cyclobenzaprine  (FLEXERIL ) 10 mg tablet, , Disp: , Rfl:  .  dapagliflozin  propanediol (Farxiga ) 10 mg tab tablet, Take 10 mg by mouth., Disp: , Rfl:  .  dexamethasone (DECADRON) 1 mg tablet, Take one tab po the night prior to testing, Disp: 1 tablet, Rfl: 0 .  dexlansoprazole  (Dexilant ) 60 mg DR capsule, Take 60 mg by mouth Once Daily., Disp: 30 capsule, Rfl: 0 .  empagliflozin  (JARDIANCE ) 10 mg tab, Take 10 mg by mouth daily., Disp: , Rfl:  .  ergocalciferol  (VITAMIN D2) 1,250 mcg (50,000 unit) capsule, Take 50,000 Units by mouth., Disp: , Rfl:  .  ezetimibe (ZETIA) 10 mg tablet, Take 10 mg by mouth Once Daily., Disp: , Rfl:  .  FreeStyle Libre 3 Plus Sensor, CHANGE SENSOR EVERY 15 DAYS, Disp: , Rfl:  .  gabapentin  (NEURONTIN ) 300 mg capsule, Take 600 mg by mouth 2 (two) times a day., Disp: 60 capsule, Rfl: 0 .  ginseng 250 mg  cap, Take 250 mg by mouth daily., Disp: , Rfl:  .  glucose blood (Accu-Chek Guide test strips) test strip, USE AS DIRECTED TO CHECK BLOOD SUGAR THREE TIMES DAILY, Disp: 100 strip, Rfl: 3 .  glucose intolerance nutritional shake (Glucerna Shake) liqd liquid, Take 237 mL by mouth., Disp: , Rfl:  .  HYDROcodone -acetaminophen  (NORCO) 5-325 mg per tablet, Take 2 tablets by mouth., Disp: , Rfl:  .  insulin  aspart U-100 (NovoLOG ) 100 unit/mL injection, Inject under skin 60 units before Breakfast, Lunch and Dinner.  Hold if skips meal and do not take at bedtime, Disp: 40 mL, Rfl: 0 .  insulin  glargine (Lantus  U-100 Insulin ) 100 unit/mL injection, Inject 70 Units under the skin nightly., Disp: 10 mL, Rfl: 1 .  insulin  syr/ndl U100 half mark (BD Veo Insulin  Syr, half unit,) 0.3 mL 31 gauge x 15/64 syrg, Inject 1 Syringe under the skin 3 (three) times a day., Disp: 100 Syringe, Rfl: 1 .  Lancets (Accu-Chek Fastclix Lancet Drum) misc, USE AS DIRECTED TO CHECK BLOOD SUGAR THREE TIMES DAILY, Disp: 306 each, Rfl: 0 .  levothyroxine  (SYNTHROID ) 100 mcg tablet, Take 100 mcg by mouth Once Daily., Disp: 30 tablet, Rfl: 0 .  lidocaine  (Lidoderm ) 5 % patch, Place 1 patch on the skin., Disp: , Rfl:  .  lisinopriL  (PRINIVIL ) 40 mg tablet, Take 40 mg by mouth Once Daily., Disp: 30 tablet, Rfl: 0 .  Livalo 4 mg tab, Take 1 tablet by mouth nightly., Disp: , Rfl:  .  mometasone  (NASONEX ) 50 mcg/actuation spry spray, Administer 1 spray into affected nostril(s)., Disp: , Rfl:  .  omeprazole (PriLOSEC) 20 mg DR capsule, Take 20 mg by mouth daily., Disp: , Rfl:  .  pen needle, diabetic 31 gauge x 3/16 ndle, INJECTS ONCE DAILY, Disp: 50 each, Rfl: 3 .  predniSONE  (DELTASONE ) 5 mg tablet, Take 6 pills for first day, 5 pills second day, 4 pills third day, 3 pills fourth day, 2 pills the fifth day, and 1 pill sixth day., Disp: , Rfl:  .  pregabalin  (LYRICA ) 300 mg capsule, Take 300 mg by mouth daily., Disp: , Rfl:  .  Premarin  0.625 mg/gram vaginal cream, Insert 1 applicator into the vagina., Disp: , Rfl:  .  simvastatin  (ZOCOR ) 40 mg tablet, Take by mouth daily., Disp: , Rfl:  .  spironolactone  (ALDACTONE ) 25 mg tablet, TAKE 1 TABLET BY MOUTH EVERY MORNING AND 1/2 TABLET BY MOUTH EVERY EVENING, Disp: , Rfl:  .  torsemide  (DEMADEX ) 20 mg tablet, Take 20 mg by mouth as needed., Disp: , Rfl:  .  zinc acetate 50 mg (zinc) cap, Take 1 capsule by mouth., Disp: , Rfl:  .  baclofen  (LIORESAL ) 10 mg tablet, Take 10 mg by mouth. (Patient not taking: Reported on 07/14/2024), Disp: , Rfl:  .  cyanocobalamin (VITAMIN B12) 1,000 mcg tablet, Take 1,000 mcg by mouth Once Daily. (Patient not taking: Reported on 07/14/2024), Disp: , Rfl:  .  liraglutide  (Victoza  2-Pak) 0.6 mg/0.1 mL (18 mg/3 mL) injection, Inject under skin 1.8 mg daily (Patient not taking: Reported on 07/14/2024), Disp: 9 mL, Rfl: 1

## 2024-07-22 ENCOUNTER — Emergency Department (HOSPITAL_COMMUNITY)

## 2024-07-22 ENCOUNTER — Emergency Department (HOSPITAL_COMMUNITY)
Admission: EM | Admit: 2024-07-22 | Discharge: 2024-07-22 | Disposition: A | Discharge status: Home / Self Care | Attending: Emergency Medicine | Admitting: Emergency Medicine

## 2024-07-22 ENCOUNTER — Encounter (HOSPITAL_COMMUNITY): Payer: Self-pay

## 2024-07-22 DIAGNOSIS — I509 Heart failure, unspecified: Secondary | ICD-10-CM | POA: Insufficient documentation

## 2024-07-22 DIAGNOSIS — R7989 Other specified abnormal findings of blood chemistry: Secondary | ICD-10-CM | POA: Diagnosis not present

## 2024-07-22 DIAGNOSIS — R0602 Shortness of breath: Secondary | ICD-10-CM | POA: Diagnosis present

## 2024-07-22 DIAGNOSIS — E119 Type 2 diabetes mellitus without complications: Secondary | ICD-10-CM | POA: Insufficient documentation

## 2024-07-22 DIAGNOSIS — Z7982 Long term (current) use of aspirin: Secondary | ICD-10-CM | POA: Insufficient documentation

## 2024-07-22 DIAGNOSIS — Z79899 Other long term (current) drug therapy: Secondary | ICD-10-CM | POA: Diagnosis not present

## 2024-07-22 DIAGNOSIS — Z794 Long term (current) use of insulin: Secondary | ICD-10-CM | POA: Insufficient documentation

## 2024-07-22 DIAGNOSIS — R600 Localized edema: Secondary | ICD-10-CM | POA: Insufficient documentation

## 2024-07-22 DIAGNOSIS — I110 Hypertensive heart disease with heart failure: Secondary | ICD-10-CM | POA: Diagnosis not present

## 2024-07-22 LAB — CBG MONITORING, ED: Glucose-Capillary: 121 mg/dL — ABNORMAL HIGH (ref 70–99)

## 2024-07-22 LAB — CBC
HCT: 43.2 % (ref 36.0–46.0)
Hemoglobin: 14.3 g/dL (ref 12.0–15.0)
MCH: 30.6 pg (ref 26.0–34.0)
MCHC: 33.1 g/dL (ref 30.0–36.0)
MCV: 92.3 fL (ref 80.0–100.0)
Platelets: 219 K/uL (ref 150–400)
RBC: 4.68 MIL/uL (ref 3.87–5.11)
RDW: 13.4 % (ref 11.5–15.5)
WBC: 8 K/uL (ref 4.0–10.5)
nRBC: 0 % (ref 0.0–0.2)

## 2024-07-22 LAB — COMPREHENSIVE METABOLIC PANEL WITH GFR
ALT: 15 U/L (ref 0–44)
AST: 36 U/L (ref 15–41)
Albumin: 3.7 g/dL (ref 3.5–5.0)
Alkaline Phosphatase: 81 U/L (ref 38–126)
Anion gap: 11 (ref 5–15)
BUN: 19 mg/dL (ref 8–23)
CO2: 28 mmol/L (ref 22–32)
Calcium: 9 mg/dL (ref 8.9–10.3)
Chloride: 101 mmol/L (ref 98–111)
Creatinine, Ser: 1.27 mg/dL — ABNORMAL HIGH (ref 0.44–1.00)
GFR, Estimated: 45 mL/min — ABNORMAL LOW (ref >60)
Glucose, Bld: 123 mg/dL — ABNORMAL HIGH (ref 70–99)
Potassium: 3.8 mmol/L (ref 3.5–5.1)
Sodium: 139 mmol/L (ref 135–145)
Total Bilirubin: 0.7 mg/dL (ref 0.0–1.2)
Total Protein: 6.9 g/dL (ref 6.5–8.1)

## 2024-07-22 LAB — PRO BRAIN NATRIURETIC PEPTIDE: Pro Brain Natriuretic Peptide: 522 pg/mL — ABNORMAL HIGH (ref <300.0)

## 2024-07-22 LAB — TROPONIN T, HIGH SENSITIVITY
Troponin T High Sensitivity: 27 ng/L — ABNORMAL HIGH (ref 0–19)
Troponin T High Sensitivity: 28 ng/L — ABNORMAL HIGH (ref 0–19)

## 2024-07-22 LAB — D-DIMER, QUANTITATIVE: D-Dimer, Quant: 1.34 ug{FEU}/mL — ABNORMAL HIGH (ref 0.00–0.50)

## 2024-07-22 MED ORDER — IOHEXOL 350 MG/ML SOLN
75.0000 mL | Freq: Once | INTRAVENOUS | Status: AC | PRN
  Administered 2024-07-22: 65 mL via INTRAVENOUS

## 2024-07-22 MED ORDER — ONDANSETRON HCL 4 MG/2ML IJ SOLN
4.0000 mg | Freq: Once | INTRAMUSCULAR | Status: AC
  Administered 2024-07-22: 4 mg via INTRAVENOUS
  Filled 2024-07-22: qty 2

## 2024-07-22 MED ORDER — TORSEMIDE 10 MG PO TABS: 10.0000 mg | ORAL_TABLET | Freq: Every day | ORAL | 0 refills | Status: AC

## 2024-07-22 MED ORDER — TORSEMIDE 20 MG PO TABS
10.0000 mg | ORAL_TABLET | Freq: Once | ORAL | Status: AC
  Administered 2024-07-22: 10 mg via ORAL
  Filled 2024-07-22: qty 1

## 2024-07-22 NOTE — ED Triage Notes (Signed)
 Patient arrives by EMS from home c/o [facility] since last PM.  Was here for same about one month ago.  Pedal edema noted, breath sounds clear per EMS, patient room air sat 92 and is on 2L [place].  Patient ambulates with walker.

## 2024-07-22 NOTE — ED Notes (Signed)
 Patient able to stand and pivot from chair to wheelchair with no assistance from staff in order to transport to bathroom. Patient stated that she can not use her left leg and proceeded to transfer herself from the wheelchair to the toilet without needing assistance from staff. Patient was able to use the safety rails in the restroom in order to pivot safely from the wheelchair to the toilet unassisted.

## 2024-07-22 NOTE — ED Notes (Signed)
 Patient placed in recliner, c/o leg cramps massaged leg, encouraged patient to point toes towards nose, elevate extremities.  Patient was tearful and now calm.  Patient concerned about BP and has a lot of anxiety.  Patient states she got poor care at [facility] and was not happy there.  Patient has also been given snacks and beverages as requested.

## 2024-07-22 NOTE — Discharge Instructions (Addendum)
 Lab work and imaging were reassuring today.  It is advised to follow-up with cardiology as soon as possible I have provided you with a referral if your current cardiology appointment does not work out.  It is advised to follow-up with your primary care provider for further management of your CPAP machine at home.  If symptoms worsen or if you experience worsening shortness of breath before returning to your primary care doctor please return the ED for further evaluation.  Please take the medication prescribed to help with the swelling.

## 2024-07-22 NOTE — ED Provider Notes (Signed)
 [place] EMERGENCY DEPARTMENT AT [facility] Provider Note   CSN: [id number] Arrival date & time: 07/22/24  1248     Patient presents with: Shortness of Breath   Anita James is a 73 y.o. female.  73 year old female presents to ED via EMS for complaints of shortness of breath.  Patient has significant history of CHF, type 2 diabetes, hypertension and was recently hospitalized for cardiac workup.  At that time an echo was performed and noted to have an ejection fraction estimation is 60 to 65%.  Patient advises she got a new CPAP machine this week and started using it last night and reports that she is unable to breathe with it on.  Provider that prescribed the CPAP device that it may be on the room setting.  She reports since then she has not been able to take a deep breath without pain.  She advises this morning she took several hours to get dressed.     Prior to Admission medications   Medication Sig Start Date End Date Taking? Authorizing Provider  torsemide  (DEMADEX ) 10 MG tablet Take 1 tablet (10 mg total) by mouth daily. 07/22/24  Yes Myriam Fonda RAMAN, PA-C  Accu-Chek FastClix Lancets MISC USE AS DIRECTED TO CHECK BLOOD SUGAR THREE TIMES DAILY 11/07/19   Vicci Barnie NOVAK, MD  ACCU-CHEK GUIDE test strip USE AS DIRECTED TO CHECK BLOOD SUGAR THREE TIMES DAILY 10/04/19   Vicci Barnie NOVAK, MD  albuterol  (ACCUNEB ) 0.63 MG/3ML nebulizer solution Take 1 ampule by nebulization 3 (three) times daily as needed for wheezing or shortness of breath. 01/07/21   [provider]  albuterol  (VENTOLIN  HFA) 108 (90 Base) MCG/ACT inhaler Inhale 2 puffs into the lungs 3 (three) times daily as needed. 03/23/24   [provider]  allopurinol  (ZYLOPRIM ) 100 MG tablet Take 100 mg by mouth daily.    [provider]  aspirin  EC 81 MG tablet Take 81 mg by mouth daily. 11/20/16   [provider]  atorvastatin  (LIPITOR ) 40 MG tablet Take 40 mg by mouth at bedtime. 03/08/24    [provider]  baclofen  (LIORESAL ) 10 MG tablet Take 1 tablet (10 mg total) by mouth 3 (three) times daily as needed for muscle spasms (if no relief after flexeril ). 06/20/24   Patsy Lenis, MD  Blood Glucose Monitoring Suppl (ACCU-CHEK AVIVA PLUS) w/Device KIT USE AS DIRECTED TO TEST TWICE DAILY 04/27/18   Vicci Barnie NOVAK, MD  Blood Pressure Monitoring KIT Use daily. 12/24/23   Court Dorn PARAS, MD  Budeson-Glycopyrrol-Formoterol  (BREZTRI  AEROSPHERE) 160-9-4.8 MCG/ACT AERO Inhale 2 puffs into the lungs in the morning and at bedtime.    [provider]  carvedilol  (COREG ) 25 MG tablet Take 25 mg by mouth 2 (two) times daily.    [provider]  cetirizine  (ZYRTEC ) 10 MG tablet Take 10 mg by mouth daily as needed for allergies.    [provider]  Continuous Glucose Sensor (FREESTYLE LIBRE 3 PLUS SENSOR) MISC SMARTSIG:Every 3 Days 04/04/24   [provider]  CRANBERRY PO Take 1 tablet by mouth daily.    [provider]  cyclobenzaprine  (FLEXERIL ) 10 MG tablet Take 1 tablet (10 mg total) by mouth 3 (three) times daily as needed for muscle spasms. 06/20/24   Patsy Lenis, MD  diclofenac  sodium (VOLTAREN ) 1 % GEL Apply 4 g topically 4 (four) times daily. Patient taking differently: Apply 4 g topically daily as needed (pain). 02/27/19   Floyd, Dan, DO  empagliflozin  (JARDIANCE )  10 MG TABS tablet Take 10 mg by mouth daily.    [provider]  feeding supplement, GLUCERNA SHAKE, (GLUCERNA SHAKE) LIQD Take 237 mLs by mouth every morning.    [provider]  fluticasone  (FLONASE ) 50 MCG/ACT nasal spray PLACE 2 SPRAYS IN EACH NOSTRIL DAILY AS NEEDED FOR ALLERGIES 07/21/19   Vicci Barnie NOVAK, MD  HYDROcodone -acetaminophen  (NORCO/VICODIN) 5-325 MG tablet Take 2 tablets by mouth every 4 (four) hours as needed. 05/02/24   Geroldine Berg, MD  insulin  aspart (NOVOLOG  FLEXPEN) 100 UNIT/ML FlexPen Inject 60 Units into the skin 3 (three) times  daily with meals. Patient taking differently: Inject 20-45 Units into the skin 3 (three) times daily after meals. 12/05/19   Vicci Barnie NOVAK, MD  Insulin  Glargine (LANTUS  SOLOSTAR) 100 UNIT/ML Solostar Pen Inject 84 Units into the skin daily. Patient taking differently: Inject 100 Units into the skin daily. 10/13/19   Vicci Barnie NOVAK, MD  Insulin  Pen Needle (TRUEPLUS PEN NEEDLES) 32G X 4 MM MISC Use to inject insulin . 10/13/18   Vicci Barnie NOVAK, MD  levothyroxine  (SYNTHROID ) 100 MCG tablet Take 1 tablet (100 mcg total) by mouth daily. 10/13/19   Vicci Barnie NOVAK, MD  lidocaine  (LIDODERM ) 5 % Place 1 patch onto the skin every 12 (twelve) hours. Remove & Discard patch within 12 hours or as directed by MD Patient taking differently: Place 1 patch onto the skin daily as needed (for pain). 10/19/23   Qiao, Jana W, MD  NOVOFINE 32G X 6 MM MISC SMARTSIG:1 SUB-Q 4-5 Times Daily 01/22/20   [provider]  NYSTATIN  powder APPLY TOPICALLY 4 TIMES DAILY. Patient taking differently: Apply 1 Bottle topically 4 (four) times daily as needed (rash). 01/18/17   Marilyne Stephane DASEN, MD  omeprazole (PRILOSEC) 20 MG capsule Take 20 mg by mouth daily.    [provider]  pregabalin  (LYRICA ) 300 MG capsule Take 300 mg by mouth daily. 05/26/24   [provider]  PREMARIN vaginal cream Place 1 applicator vaginally daily as needed (dryness). 03/23/24   [provider]  torsemide  (DEMADEX ) 20 MG tablet Take 1 tablet (20 mg total) by mouth every other day. If weight gain of 3-5 pounds in 24 hour period, take daily for 2-3 days 06/20/24   Patsy Lenis, MD  Zinc Acetate 50 MG CAPS Take 1 capsule by mouth in the morning and at bedtime.    [provider]    Allergies: Amoxicillin-pot clavulanate, Oxycodone , Penicillins, Sulfa antibiotics, Norvasc  [amlodipine  besylate], Sulfamethoxazole, Tramadol , and Amoxicillin    Review of Systems  Respiratory:  Positive for chest tightness and  shortness of breath.   All other systems reviewed and are negative.   Updated Vital Signs BP (!) 180/117   Pulse 93   Temp 98.4 F (36.9 C) (Oral)   Resp 16   SpO2 94%   Physical Exam Vitals and nursing note reviewed.  Constitutional:      General: She is not in acute distress.    Appearance: Normal appearance. She is well-developed. She is not ill-appearing.  HENT:     Head: Normocephalic and atraumatic.     Nose: Nose normal.  Eyes:     Extraocular Movements: Extraocular movements intact.     Conjunctiva/sclera: Conjunctivae normal.     Pupils: Pupils are equal, round, and reactive to light.  Cardiovascular:     Rate and Rhythm: Normal rate and regular rhythm.  Pulmonary:     Effort: Pulmonary effort is normal. No tachypnea or  respiratory distress.     Breath sounds: Normal breath sounds.  Abdominal:     Palpations: Abdomen is soft.     Tenderness: There is no abdominal tenderness. There is no guarding.  Musculoskeletal:        General: Normal range of motion.     Cervical back: Normal range of motion.     Right lower leg: 2+ Edema present.     Left lower leg: 2+ Edema present.     Comments: Duplex pitting edema bilaterally.  No unilateral swelling noted, no redness or warmth on exam.  Skin:    General: Skin is warm.     Capillary Refill: Capillary refill takes less than 2 seconds.  Neurological:     General: No focal deficit present.     Mental Status: She is alert.  Psychiatric:        Mood and Affect: Mood normal.        Behavior: Behavior normal.     (all labs ordered are listed, but only abnormal results are displayed) Labs Reviewed  COMPREHENSIVE METABOLIC PANEL WITH GFR - Abnormal; Notable for the following components:      Result Value   Glucose, Bld 123 (*)    Creatinine, Ser 1.27 (*)    GFR, Estimated 45 (*)    All other components within normal limits  PRO BRAIN NATRIURETIC PEPTIDE - Abnormal; Notable for the following components:   Pro Brain  Natriuretic Peptide 522.0 (*)    All other components within normal limits  D-DIMER, QUANTITATIVE (NOT AT [facility]) - Abnormal; Notable for the following components:   D-Dimer, Quant 1.34 (*)    All other components within normal limits  CBG MONITORING, ED - Abnormal; Notable for the following components:   Glucose-Capillary 121 (*)    All other components within normal limits  TROPONIN T, HIGH SENSITIVITY - Abnormal; Notable for the following components:   Troponin T High Sensitivity 27 (*)    All other components within normal limits  TROPONIN T, HIGH SENSITIVITY - Abnormal; Notable for the following components:   Troponin T High Sensitivity 28 (*)    All other components within normal limits  CBC    EKG: EKG Interpretation Date/Time:  Saturday July 22 2024 14:08:39 EDT Ventricular Rate:  83 PR Interval:    QRS Duration:  111 QT Interval:  394 QTC Calculation: 463 R Axis:   -30  Text Interpretation: Normal sinus rhythm Left ventricular hypertrophy Anterior Q waves, possibly due to LVH Confirmed by Yolande Charleston [phone]) on 07/22/2024 2:33:02 PM  Radiology: CT Angio Chest PE W and/or Wo Contrast Result Date: 07/22/2024 EXAM: CTA of the Chest with contrast for PE 07/22/2024 05:41:47 PM TECHNIQUE: CTA of the chest was performed after the administration of intravenous contrast. Multiplanar reformatted images are provided for review. MIP images are provided for review. Automated exposure control, iterative reconstruction, and/or weight based adjustment of the mA/kV was utilized to reduce the radiation dose to as low as reasonably achievable. COMPARISON: 06/15/2024 CLINICAL HISTORY: Pulmonary embolism (PE) suspected, low to intermediate prob, positive D-dimer. FINDINGS: PULMONARY ARTERIES: Pulmonary arteries are adequately opacified for evaluation. No pulmonary embolism. Main pulmonary artery is normal in caliber. MEDIASTINUM: Mild cardiomegaly. The pericardium demonstrates no acute  abnormality. Aortic atherosclerosis. LYMPH NODES: No mediastinal, hilar or axillary lymphadenopathy. LUNGS AND PLEURA: Mild vascular congestion. Ground glass opacities in the mid and lower lungs could reflect early edema. No focal consolidation. No pleural effusion or pneumothorax. UPPER ABDOMEN: Limited images of  the upper abdomen are unremarkable. SOFT TISSUES AND BONES: No acute bone or soft tissue abnormality. IMPRESSION: 1. No pulmonary embolism. 2. Mild vascular congestion and ground-glass opacities in the mid and lower lungs, possibly reflecting early edema. . 3. Mild cardiomegaly and aortic atherosclerosis. Electronically signed by: Franky Crease MD 07/22/2024 05:47 PM EDT RP Workstation: [id number]   DG Chest 2 View Result Date: 07/22/2024 CLINICAL DATA:  Shortness of breath.  Pedal edema. EXAM: CHEST - 2 VIEW COMPARISON:  06/14/2024. FINDINGS: The heart is enlarged and the mediastinal contour is within normal limits. There is atherosclerotic calcification of the aorta. The pulmonary vasculature is mildly distended. No consolidation, effusion, or pneumothorax is seen. No acute osseous abnormality. IMPRESSION: Cardiomegaly with mildly distended pulmonary vasculature. Electronically Signed   By: Leita Birmingham M.D.   On: 07/22/2024 14:46    Procedures   Medications Ordered in the ED  iohexol  (OMNIPAQUE ) 350 MG/ML injection 75 mL (65 mLs Intravenous Contrast Given 07/22/24 1740)  ondansetron  (ZOFRAN ) injection 4 mg (4 mg Intravenous Given 07/22/24 1837)  torsemide  (DEMADEX ) tablet 10 mg (10 mg Oral Given 07/22/24 1859)    73 y.o. female presents to the ED with complaints of shortness of breath, this involves an extensive number of treatment options, and is a complaint that carries with it a high risk of complications and morbidity.  The differential diagnosis includes CHF exacerbation, pneumothorax, ACS, pulmonary effusion, pneumonia, (Ddx)  On arrival pt is nontoxic, vitals significant for  hypertension. Exam significant for clear lungs auscultation she does report some pain with deep inspiration.  Additional history obtained from chart review significant for echocardiogram performed approximately 1 month ago significant for ejection fraction 60 to 65%.  I ordered medication torsemide  and Zofran  for swelling and nausea  Lab Tests:  I Ordered, reviewed, and interpreted labs, which included: CMP, CBC, D-dimer, CBG, troponin, BNP.  D-dimer was elevated, there was no unilateral leg swelling and CTA was negative for PE.  Creatinine and GFR are baseline for patient.  Troponin was mildly elevated and delta was flat.  BNP mildly elevated patient does have history of congestive heart failure with current edema.  Patient will be treated for edema.  Imaging Studies ordered:  I ordered imaging studies which included chest x-ray, I independently visualized and interpreted imaging which showed cardiomegaly, CTA noted some mild vascular congestion without a PE.  ED Course:   Patient is a comfortable in ED bed in no acute distress nontoxic-appearing upon initial evaluation.  Patient has no obvious respiratory distress and lungs are clear to auscultation all fields.  D-dimer was elevated at 1.4.  Age-adjusted was calculated which resulted in possible thrombosis.  CTA will be ordered to rule out PE.  Patient's blood pressure was elevated in the ED and she takes blood pressure medication day and night.  She reports she has not taken her evening medications yet.  Patient was advised of findings and she reported that she was concerned with going home due to difficulties with her CPAP machine for her diagnosed sleep apnea.  Patient was advised to contact her primary care who prescribed her CPAP machine for management of CPAP machine.  Patient was helped walked around ED bed with oxygen  saturation staying around 93, when she sat down oxygen  saturation increased to 96% on room air.    Patient was advised  if she had any worsening shortness of breath before she could follow-up with primary care or cardiology to return to ED.  Patient agreed to return  precautions.  Patient was discussed with attending and he agreed that she was cleared for discharge.  Portions of this note were generated with Scientist, clinical (histocompatibility and immunogenetics). Dictation errors may occur despite best attempts at proofreading.   Final diagnoses:  Bilateral lower extremity edema  Shortness of breath    ED Discharge Orders          Ordered    torsemide  (DEMADEX ) 10 MG tablet  Daily        07/22/24 1918    Ambulatory referral to Cardiology       Comments: If you have not heard from the Cardiology office within the next 72 hours please call [phone].   07/22/24 1920               Myriam Fonda RAMAN, PA-C 07/22/24 2015    Yolande Lamar BROCKS, MD 07/27/24 1059

## 2024-07-27 NOTE — Progress Notes (Signed)
 [facility] Heart and Vascular Cardiology New Patient Visit  PCP: Comer Angus, PA-C   Portions of this note were dictated using DRAGON voice recognition software. Please disregard any errors in transcription. This record has been created using Conservation officer, historic buildings. Errors have been sought and corrected, but may not always be located. Such creation errors do not reflect on the standard of medical care.  Reason for Visit:  Chief Complaint  Patient presents with  . Consult    Cardiology Assessment & Plan:  1. Swelling (Primary)  2. Shortness of breath  3. OSA (obstructive sleep apnea)  4. Essential hypertension  5. Chronic kidney disease, unspecified CKD stage   Follow-up-3 months   PLAN:   Anita James has lower extremity swelling and shortness of breath.  I suspect this is multifactorial including her chronic kidney disease and her sleep apnea.  She may have some component of diastolic dysfunction although her most recent echocardiogram did not suggest evidence of increased left atrial pressure.  She has preserved left ventricular systolic function.  Symptomatic treatment is recommended.  She is on carvedilol  25 mg twice daily, lisinopril  40 mg a day, spironolactone  25 mg a day and Jardiance  10 mg a day.  She takes some torsemide  on an as needed basis.  Her nephrologist recently recommended she try increasing the torsemide  to 3 times per week.  I will defer to the nephrologist for management of her diuretic therapy.  I have recommended she follow-up with a pulmonologist for management of sleep apnea.  Her current CPAP machine is apparently not working appropriately.  Her blood pressure was elevated here in the office today.  She is on a good antihypertensive regimen.  I will defer to the nephrologist for any thoughts on further antihypertensive therapy.  I would not make any changes in her cardiac regimen.  I will plan to see her back in 3  months.    History of Present Illness  Anita James is a 73 y.o. female with a past medical history significant for hypertension, diabetes mellitus, chronic kidney disease, sleep apnea and swelling of her legs who presented to the office today for second opinion regarding management of her leg swelling.  The patient states she sees a doctor at [facility].  Apparently with the thought she had a ventral hernia, she underwent an ultrasound of her abdomen confirming the ventral hernia.  She was referred to Dr. Teresa for an opinion regarding management of the ventral hernia.  He agrees that she has a large ventral hernia that ultimately should be surgically repaired however with her obesity recommended some attempts at weight loss prior to the surgery.  He apparently recommended she see a cardiologist for some help with management of her leg swelling.  She was referred to Dr. Roselind who she met on October 24.  He was of the opinion that her lower extremity swelling was probably multifactorial including her sleep apnea, obesity and chronic kidney disease.  He recommended continued medical therapy with symptomatic treatment of the swelling as she has been doing.  She was not pleased with his thoughts and requested a second opinion and presented to the office today.  She has had the lower extremity swelling for years.  She has been given a diagnosis of diastolic dysfunction in the past although recent echo Cardea him suggesting only grade 1 diastolic dysfunction without evidence of increased left atrial pressure which would make a contribution of the swelling less likely.  She is treated for her hypertension and this swelling with carvedilol , lisinopril , spironolactone , torsemide  as needed and Jardiance .  She still experiences swelling.  Para she sees a nephrologist for management of her chronic kidney disease.  Her nephrologist recently recommended taking the torsemide  up to 3 times per  week where she had previously only taking it as needed.  She has dyspnea on exertion.  She denies orthopnea, PND, syncope or near syncope.  Her medical history is marked for her hypertension, dyslipidemia, diabetes mellitus, sleep apnea, chronic kidney disease and her ventral hernia.  She is a non-smoker of cigarettes but does smoke marijuana.  Allergies[1]  Medications:  Current Medications[2]   Medical History[3]  Surgical History[4]  Social History: Tobacco Use History[5] Social History   Substance and Sexual Activity  Alcohol Use No   Social History   Substance and Sexual Activity  Drug Use No    Family History[6]  Review of Systems: A limited review of systems was performed.  Pertinent positives are noted in the HPI.  All others are negative   Objective  Physical Exam: Vitals:   07/27/24 1329  BP: 150/69  Pulse:   SpO2:    @VSINC @ Body mass index is 54.18 kg/m.   General appearance - alert, well appearing, and in no distress, oriented to person, place, and time, and overweight Mental status - alert, oriented to person, place, and time, normal mood, behavior, speech, dress, motor activity, and thought processes Eyes - left eye normal, right eye normal Ears - right ear normal, left ear normal Nose - normal and patent, no erythema, discharge or polyps Mouth - mucous membranes moist, pharynx normal without lesions Neck - supple, no significant adenopathy Chest - clear to auscultation, no wheezes, rales or rhonchi, symmetric air entry Heart - normal rate, regular rhythm, normal S1, S2, no murmurs, rubs, clicks or gallops Abdomen - soft, nontender, nondistended, no masses or organomegaly Neurological - alert, oriented, normal speech, no focal findings or movement disorder noted Musculoskeletal - no joint tenderness, deformity or swelling Extremities - peripheral pulses normal, no pedal edema, no clubbing or cyanosis Skin - normal coloration and turgor, no  rashes, no suspicious skin lesions noted  She walks with a rolling walker    Test Results none  Labs:  No results found for: NA, K, MG, BUN, CREATININE, CHOL, TRIG, HDL, LDL, HGB, HCT, PLT, TSH No results found for: INR, PROTIME    Debby Isla Manner, MD, MD, [facility] 07/27/2024, 1:47 PM      [1] Allergies Allergen Reactions  . Hydrocodone -Acetaminophen  Shortness Of Breath  . Oxycodone  Shortness Of Breath    oxycontin  too  . Amlodipine  Other (See Comments) and Swelling    feet  feet  feet  . Amoxicillin-Pot Clavulanate GI Intolerance  . Azithromycin  Other (See Comments)  . Sulfamethoxazole GI Intolerance  . Tramadol  Other (See Comments)    Unknown, toxicity. Patient advised she had to be given Narcan with this  [2]  Current Outpatient Medications:  .  albuterol  0.63 mg/3 mL nebulizer solution, USE 1 VIAL VIA NEBULIZER EVERY 4-6 HOURS AS NEEDED, Disp: , Rfl:  .  albuterol  HFA (PROVENTIL  HFA;VENTOLIN  HFA;PROAIR  HFA) 90 mcg/actuation inhaler, USE 2 PUFFS 3 TIMES A DAY AS NEEDED FOR SHORTNESS OF BREATH AND WHEEZE, Disp: , Rfl:  .  alcohol swabs padm, Use 1 swab as needed to clean site for blood sugar check., Disp: 100 each, Rfl: 11 .  allopurinoL  (ZYLOPRIM ) 100 mg tablet, Take 100 mg by mouth  daily., Disp: , Rfl:  .  aspirin  81 mg EC tablet, Take 81 mg by mouth Once Daily., Disp: 30 tablet, Rfl: 0 .  atorvastatin  (LIPITOR ) 40 mg tablet, Take 40 mg by mouth Once Daily., Disp: 30 tablet, Rfl: 0 .  blood-glucose meter misc, Use meter to check blood sugar 3 times a day.  Please dispense based on availability, insurance coverage, and patient preference., Disp: 1 each, Rfl: 0 .  Breztri  Aerosphere 160-9-4.8 mcg/actuation inhaler, Inhale 2 puffs., Disp: , Rfl:  .  C,E,zinc,copper 11-omega3s-lut (50 Plus Adult Eye Health) 250-5-1 mg cap, Take 1 tablet by mouth 2 (two) times a day., Disp: , Rfl:  .  calcium  carbonate-mag hydroxid (Antacid, calcium   carb-mag hyd,) 550-110 mg chew, Take 1 tablet by mouth 2 (two) times a day with meals., Disp: 60 tablet, Rfl: 0 .  carvediloL  (COREG ) 12.5 mg tablet, Take 12.5 mg by mouth 2 (two) times a day. (Patient taking differently: Take 25 mg by mouth 2 (two) times a day.), Disp: 60 tablet, Rfl: 3 .  cetirizine  (ZyrTEC ) 10 mg tablet, Take by mouth., Disp: , Rfl:  .  cranberry 500 mg cap, Take 1 capsule by mouth Once Daily., Disp: , Rfl:  .  cyanocobalamin (VITAMIN B12) 1,000 mcg tablet, Take 1,000 mcg by mouth Once Daily., Disp: , Rfl:  .  cyclobenzaprine  (FLEXERIL ) 10 mg tablet, , Disp: , Rfl:  .  dapagliflozin  propanediol (Farxiga ) 10 mg tab tablet, Take 10 mg by mouth., Disp: , Rfl:  .  dexlansoprazole  (Dexilant ) 60 mg DR capsule, Take 60 mg by mouth Once Daily., Disp: 30 capsule, Rfl: 0 .  empagliflozin  (JARDIANCE ) 10 mg tab, Take 10 mg by mouth daily., Disp: , Rfl:  .  ergocalciferol  (VITAMIN D2) 1,250 mcg (50,000 unit) capsule, Take 50,000 Units by mouth., Disp: , Rfl:  .  ezetimibe (ZETIA) 10 mg tablet, Take 10 mg by mouth Once Daily., Disp: , Rfl:  .  FreeStyle Libre 3 Plus Sensor, CHANGE SENSOR EVERY 15 DAYS, Disp: , Rfl:  .  ginseng 250 mg cap, Take 250 mg by mouth daily., Disp: , Rfl:  .  glucose blood (Accu-Chek Guide test strips) test strip, USE AS DIRECTED TO CHECK BLOOD SUGAR THREE TIMES DAILY, Disp: 100 strip, Rfl: 3 .  glucose intolerance nutritional shake (Glucerna Shake) liqd liquid, Take 237 mL by mouth., Disp: , Rfl:  .  HYDROcodone -acetaminophen  (NORCO) 5-325 mg per tablet, Take 2 tablets by mouth., Disp: , Rfl:  .  insulin  aspart U-100 (NovoLOG ) 100 unit/mL injection, Inject under skin 60 units before Breakfast, Lunch and Dinner.  Hold if skips meal and do not take at bedtime, Disp: 40 mL, Rfl: 0 .  insulin  glargine (Lantus  U-100 Insulin ) 100 unit/mL injection, Inject 70 Units under the skin nightly., Disp: 10 mL, Rfl: 1 .  insulin  syr/ndl U100 half mark (BD Veo Insulin  Syr, half  unit,) 0.3 mL 31 gauge x 15/64 syrg, Inject 1 Syringe under the skin 3 (three) times a day., Disp: 100 Syringe, Rfl: 1 .  Lancets (Accu-Chek Fastclix Lancet Drum) misc, USE AS DIRECTED TO CHECK BLOOD SUGAR THREE TIMES DAILY, Disp: 306 each, Rfl: 0 .  levothyroxine  (SYNTHROID ) 100 mcg tablet, Take 100 mcg by mouth Once Daily., Disp: 30 tablet, Rfl: 0 .  lidocaine  (Lidoderm ) 5 % patch, Place 1 patch on the skin., Disp: , Rfl:  .  lisinopriL  (PRINIVIL ) 40 mg tablet, Take 40 mg by mouth Once Daily., Disp: 30 tablet, Rfl: 0 .  Livalo 4 mg tab, Take 1 tablet by mouth nightly., Disp: , Rfl:  .  mometasone  (NASONEX ) 50 mcg/actuation spry spray, Administer 1 spray into affected nostril(s)., Disp: , Rfl:  .  omeprazole (PriLOSEC) 20 mg DR capsule, Take 20 mg by mouth daily., Disp: , Rfl:  .  oxyCODONE -acetaminophen  (PERCOCET) 5-325 mg per tablet, Take 1 tablet by mouth every 6 (six) hours as needed for pain., Disp: , Rfl:  .  pen needle, diabetic 31 gauge x 3/16 ndle, INJECTS ONCE DAILY, Disp: 50 each, Rfl: 3 .  predniSONE  (DELTASONE ) 5 mg tablet, Take 6 pills for first day, 5 pills second day, 4 pills third day, 3 pills fourth day, 2 pills the fifth day, and 1 pill sixth day., Disp: , Rfl:  .  pregabalin  (LYRICA ) 300 mg capsule, Take 300 mg by mouth daily., Disp: , Rfl:  .  Premarin 0.625 mg/gram vaginal cream, Insert 1 applicator into the vagina., Disp: , Rfl:  .  simvastatin  (ZOCOR ) 40 mg tablet, Take by mouth daily., Disp: , Rfl:  .  spironolactone  (ALDACTONE ) 25 mg tablet, TAKE 1 TABLET BY MOUTH EVERY MORNING AND 1/2 TABLET BY MOUTH EVERY EVENING, Disp: , Rfl:  .  torsemide  (DEMADEX ) 20 mg tablet, Take 20 mg by mouth as needed., Disp: , Rfl:  .  zinc acetate 50 mg (zinc) cap, Take 1 capsule by mouth., Disp: , Rfl:  .  baclofen  (LIORESAL ) 10 mg tablet, Take 10 mg by mouth. (Patient not taking: Reported on 07/27/2024), Disp: , Rfl:  .  dexamethasone (DECADRON) 1 mg tablet, Take one tab po the night prior  to testing (Patient not taking: Reported on 07/27/2024), Disp: 1 tablet, Rfl: 0 .  gabapentin  (NEURONTIN ) 300 mg capsule, Take 600 mg by mouth 2 (two) times a day. (Patient not taking: Reported on 07/27/2024), Disp: 60 capsule, Rfl: 0 .  liraglutide  (Victoza  2-Pak) 0.6 mg/0.1 mL (18 mg/3 mL) injection, Inject under skin 1.8 mg daily (Patient not taking: Reported on 07/27/2024), Disp: 9 mL, Rfl: 1 [3] Past Medical History: Diagnosis Date  . Anxiety   . DM type 2 (diabetes mellitus, type 2)   . Essential hypertension   . GERD (gastroesophageal reflux disease)   . Hiatal hernia   . Hypothyroidism   . Peripheral neuropathy   . Sleep apnea   [4] Past Surgical History: Procedure Laterality Date  . CESAREAN SECTION, UNSPECIFIED     Procedure: CESAREAN SECTION  [5] Social History Tobacco Use  Smoking Status Never  Smokeless Tobacco Never  [6] Family History Problem Relation Name Age of Onset  . Cancer Mother         Cervical Cancer  . Diabetes Mother    . Obesity Mother    . Diabetes Father    . Heart disease Father    . Sleep apnea Sister    . Colon cancer Neg Hx    . Esophageal cancer Neg Hx    . Stomach cancer Neg Hx

## 2024-07-31 ENCOUNTER — Encounter: Payer: Self-pay | Admitting: Pulmonary Disease

## 2024-07-31 ENCOUNTER — Ambulatory Visit (INDEPENDENT_AMBULATORY_CARE_PROVIDER_SITE_OTHER): Admitting: Pulmonary Disease

## 2024-07-31 VITALS — BP 144/80 | HR 88 | Temp 98.3°F | Ht 65.0 in | Wt 327.0 lb

## 2024-07-31 DIAGNOSIS — R0602 Shortness of breath: Secondary | ICD-10-CM | POA: Diagnosis not present

## 2024-07-31 DIAGNOSIS — G4733 Obstructive sleep apnea (adult) (pediatric): Secondary | ICD-10-CM

## 2024-07-31 DIAGNOSIS — I2729 Other secondary pulmonary hypertension: Secondary | ICD-10-CM | POA: Diagnosis not present

## 2024-07-31 NOTE — Patient Instructions (Signed)
 DME referral for CPAP change  Change from the pressure settings of 4-20  to 15-20  Schedule pulmonary function test, can be done on the day of next visit  Call us  within a few weeks if the new pressure change is not tolerated, if the pressure is not tolerated, then we can have the machine set at the pressure settings of your previous machine which was 17  Once we get the breathing study, if there is any significant abnormality, we can decide on if any other inhaler is needed  Graded activities as tolerated  Follow-up in 4 to 6 weeks

## 2024-07-31 NOTE — Progress Notes (Signed)
 Anita James    [id number]    [birth year]  Primary Care Physician:Shannon, Jaimie, NP  Referring Physician: Clotilda Risen, NP [address] [place],  [place] [address]  Chief complaint:   Patient being seen for shortness of breath, obstructive sleep apnea Not tolerating current CPAP Intolerance to the machine started following recent hospitalization, by the time she started to try to use the machine again she just felt it was not tolerated She was diagnosed with obstructive sleep apnea many years ago, has a previous machine, current machine is at least 58 to 71 years old She will not be due a new machine until probably December 2026 She has taken the machine to [facility] and they have checked out the machine and said it is working well She does have a backup machine that she tolerates Looking through her record she had had a previous setting of CPAP of 17 from a previous study  She was following up at [facility] for pulmonary needs and CPAP needs DME company is [employer]  Decreased ability to get around since recent visit, went into the hospital for shortness of breath, 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  History of Present Illness Anita James is a 73 year old female with sleep apnea who presents with issues related to her CPAP machine and new onset of left leg weakness. A friend, Ruffus Hope, referred her for evaluation of her CPAP machine issues.  She was hospitalized a month ago and since then has experienced worsening symptoms, including the loss of use of her left leg, which has been diagnosed as 'drop foot.'  She has been using a CPAP machine for sleep apnea for approximately seven years, initially prescribed after a critical incident at [facility]. She feels like she is 'suffocating' when using the  machine, which has not felt right since her hospitalization. She has taken the machine to the supplier, [employer], who confirmed the settings as 4 to 20, but she remains concerned about its functionality.  Her oxygen  levels dropped to 84% after using the machine, leading to a hospital visit where she was found to have fluid on her feet, lungs, and heart. She has a backup CPAP machine from seven years ago, which she feels works better, although she still wakes up two to three times a night. The pressure on this older machine was set at 17.  She has been unable to get a response from her previous CPAP managing physician at [facility], despite calling for over two weeks.  She feels tired and sleepy upon waking in the morning, despite using a backup CPAP machine.   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   albuterol  (VENTOLIN  HFA) 108 (90 Base) MCG/ACT inhaler Inhale 2 puffs into the lungs 3 (three) times daily as needed.   allopurinol  (ZYLOPRIM ) 100 MG tablet Take 100 mg by mouth daily.   aspirin  EC 81 MG tablet Take 81 mg by mouth daily.   atorvastatin  (LIPITOR ) 40 MG tablet Take 40 mg by mouth at bedtime.   baclofen   (LIORESAL ) 10 MG tablet Take 1 tablet (10 mg total) by mouth 3 (three) times daily as needed for muscle spasms (if no relief after flexeril ).   Blood Glucose Monitoring Suppl (ACCU-CHEK AVIVA PLUS) w/Device KIT USE AS DIRECTED TO TEST TWICE DAILY   Blood Pressure Monitoring KIT Use daily.   Budeson-Glycopyrrol-Formoterol  (BREZTRI  AEROSPHERE) 160-9-4.8 MCG/ACT AERO Inhale 2 puffs into the lungs in the morning and at bedtime.   carvedilol  (COREG ) 25 MG tablet Take 25 mg by mouth 2 (two) times daily.   cetirizine  (ZYRTEC ) 10 MG tablet Take 10 mg by mouth daily as needed for allergies.   Continuous Glucose Sensor (FREESTYLE LIBRE 3 PLUS SENSOR) MISC SMARTSIG:Every 3 Days   CRANBERRY PO Take 1 tablet by mouth daily.   cyclobenzaprine  (FLEXERIL ) 10 MG tablet Take 1 tablet (10 mg total) by mouth 3 (three) times daily as needed for muscle spasms.   diclofenac  sodium (VOLTAREN ) 1 % GEL Apply 4 g topically 4 (four) times daily. (Patient taking differently: Apply 4 g topically daily as needed (pain).)   empagliflozin  (JARDIANCE ) 10 MG TABS tablet Take 10 mg by mouth daily.   feeding supplement, GLUCERNA SHAKE, (GLUCERNA SHAKE) LIQD Take 237 mLs by mouth every morning.   fluticasone  (FLONASE ) 50 MCG/ACT nasal spray PLACE 2 SPRAYS IN EACH NOSTRIL DAILY AS NEEDED FOR ALLERGIES   HYDROcodone -acetaminophen  (NORCO/VICODIN) 5-325 MG tablet Take 2 tablets by mouth every 4 (four) hours as needed.   insulin  aspart (NOVOLOG  FLEXPEN) 100 UNIT/ML FlexPen Inject 60 Units into the skin 3 (three) times daily with meals. (Patient taking differently: Inject 20-45 Units into the skin 3 (three) times daily after meals.)   Insulin  Glargine (LANTUS  SOLOSTAR) 100 UNIT/ML Solostar Pen Inject 84 Units into the skin daily. (Patient taking differently: Inject 100 Units into the skin daily.)   Insulin  Pen Needle (TRUEPLUS PEN NEEDLES) 32G X 4 MM MISC Use to inject insulin .   levothyroxine  (SYNTHROID ) 100 MCG tablet Take 1 tablet (100 mcg  total) by mouth daily.   lidocaine  (LIDODERM ) 5 % Place 1 patch onto the skin every 12 (twelve) hours. Remove & Discard patch within 12 hours or as directed by MD (Patient taking differently: Place 1 patch onto the skin daily as needed (for pain).)   NOVOFINE 32G X 6 MM MISC SMARTSIG:1 SUB-Q 4-5 Times Daily   NYSTATIN  powder APPLY TOPICALLY 4 TIMES DAILY. (Patient taking differently: Apply 1 Bottle topically 4 (four) times daily as needed (rash).)   omeprazole (PRILOSEC) 20 MG capsule Take 20 mg by mouth daily.   pregabalin  (LYRICA ) 300 MG capsule Take 300 mg by mouth daily.   PREMARIN vaginal cream Place 1 applicator vaginally daily as needed (dryness).   torsemide  (DEMADEX ) 10 MG tablet Take 1 tablet (10 mg total) by mouth daily.   torsemide  (DEMADEX ) 20 MG tablet Take 1 tablet (20 mg total) by mouth every other day. If weight gain of 3-5 pounds in 24 hour period, take daily  for 2-3 days   Zinc Acetate 50 MG CAPS Take 1 capsule by mouth in the morning and at bedtime.   No facility-administered encounter medications on file as of 07/31/2024.    Allergies as of 07/31/2024 - Review Complete 07/31/2024  Allergen Reaction Noted   Amoxicillin-pot clavulanate Nausea And Vomiting and Other (See Comments) 08/07/2011   Oxycodone  Shortness Of Breath 11/13/2016   Penicillins Anaphylaxis 11/05/2023   Sulfa antibiotics Shortness Of Breath 08/07/2011   Norvasc  [amlodipine  besylate] Swelling 09/08/2016   Sulfamethoxazole Nausea Only and Other (See Comments) 05/24/2013   Tramadol  Other (See Comments) 11/26/2016   Amoxicillin Rash 11/22/2019    Past Medical History:  Diagnosis Date   Abdominal pain    Anxiety    Carpal tunnel syndrome 06/14/2014   Chest pain    CHF (congestive heart failure) (HCC)    Chronic back pain    Chronic kidney disease 03/2013   failure due to meds   Diabetes mellitus type II, uncontrolled    Diverticulitis    Frequent headaches    Hiatal hernia    Hyperlipemia     Hypertension    Hypothyroidism    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   Neuropathy    Obesity    Pneumonia    Vitamin D  deficiency 06/05/2014    Past Surgical History:  Procedure Laterality Date   CESAREAN SECTION     COLONOSCOPY WITH PROPOFOL  N/A 07/01/2021   Procedure: COLONOSCOPY WITH PROPOFOL ;  Surgeon: Saintclair Jasper, MD;  Location: [facility];  Service: Gastroenterology;  Laterality: N/A;   KNEE SURGERY Left    THYROID SURGERY     goiter    Family History  Problem Relation Age of Onset   Cancer Mother        cervical- mets   Breast cancer Mother    Cancer Father    Heart disease Father    Diabetes Father    Diabetes Sister    Breast cancer Sister    Heart disease Sister    Breast cancer Sister    Heart disease Sister    Breast cancer Sister    Breast cancer Sister    COPD Sister    Breast cancer Sister    Colon cancer Neg Hx    Esophageal cancer Neg Hx    Rectal cancer Neg Hx    Stomach cancer Neg Hx     Social History   Socioeconomic History   Marital status: Divorced    Spouse name: Not on file   Number of children: 6   Years of education: BA   Highest education level: Not on file  Occupational History   Occupation: unemployed  Tobacco Use   Smoking status: Never   Smokeless tobacco: Never  Vaping Use   Vaping status: Never Used  Substance and Sexual Activity   Alcohol use: No   Drug use: No   Sexual activity: Not on file  Other Topics Concern   Not on file  Social History Narrative   Are you right handed or left handed? Right   Are you currently employed ?    What is your current occupation? Works 4 hours a week   Do you live at home alone?   Who lives with you? daughter   What type of home do you live in: 1 story or 2 story? one   Caffeine  1 glass of tea     Social Drivers of Corporate Investment Banker Strain: Not on file  Food  Insecurity: No Food Insecurity (06/16/2024)   Hunger Vital Sign    Worried About Running Out of Food in the  Last Year: Never true    Ran Out of Food in the Last Year: Never true  Transportation Needs: No Transportation Needs (06/16/2024)   PRAPARE - Administrator, Civil Service (Medical): No    Lack of Transportation (Non-Medical): No  Physical Activity: Not on file  Stress: Not on file  Social Connections: Socially Isolated (06/16/2024)   Social Connection and Isolation Panel    Frequency of Communication with Friends and Family: More than three times a week    Frequency of Social Gatherings with Friends and Family: Once a week    Attends Religious Services: Never    Database Administrator or Organizations: No    Attends Banker Meetings: Never    Marital Status: Divorced  Catering Manager Violence: Not At Risk (06/16/2024)   Humiliation, Afraid, Rape, and Kick questionnaire    Fear of Current or Ex-Partner: No    Emotionally Abused: No    Physically Abused: No    Sexually Abused: No    Review of Systems  Respiratory:  Positive for apnea and shortness of breath.   Psychiatric/Behavioral:  Positive for sleep disturbance.     Vitals:   07/31/24 0927  BP: (!) 144/80  Pulse: 88  Temp: 98.3 F (36.8 C)  SpO2: 92%     Physical Exam Constitutional:      Appearance: She is obese.  HENT:     Head: Normocephalic.     Nose: Nose normal.     Mouth/Throat:     Mouth: Mucous membranes are moist.  Eyes:     General: No scleral icterus.    Pupils: Pupils are equal, round, and reactive to light.  Cardiovascular:     Rate and Rhythm: Normal rate and regular rhythm.     Heart sounds: No murmur heard.    No friction rub.  Pulmonary:     Effort: No respiratory distress.     Breath sounds: No stridor. No wheezing or rhonchi.  Musculoskeletal:     Cervical back: No rigidity or tenderness.  Neurological:     Mental Status: She is alert.  Psychiatric:        Mood and Affect: Mood normal.    Epworth Sleepiness Scale of 16  Data Reviewed: Previous sleep study  reviewed from 2017 showing mild obstructive sleep apnea Titrated to CPAP of 17  CT scan with cardiomegaly, pulmonary vascular congestion  Echocardiogram 06/16/2024 with normal ejection fraction, diastolic dysfunction, mild elevation of pulmonary pressures  No PFTs on record  Assessment and Plan Assessment & Plan Obstructive sleep apnea with CPAP intolerance and suboptimal therapy Obstructive sleep apnea with CPAP intolerance and suboptimal therapy. Reports suffocation sensation with current CPAP machine, set at 4-20 cm H2O. Last sleep study in 2017 showed optimal pressure at 17 cm H2O. Current machine may not be providing adequate pressure, leading to nocturnal hypoxemia (oxygen  saturation dropping to 84%). Backup machine set at 17 cm H2O provides better symptom control. No recent changes to CPAP settings. Medical supply company confirmed machine is functioning properly. Requires reassessment of CPAP settings and potential sleep study to confirm current severity of sleep apnea. - Sent prescription to [facility] to adjust CPAP pressure to 15-20 cm H2O. - Will consider referral to sleep lab for sleep study to reassess sleep apnea severity and CPAP settings.  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  Jennet Epley MD [facility] 07/31/2024, 9:52 AM  CC: Shannon, Jaimie, NP

## 2024-10-05 ENCOUNTER — Encounter: Payer: Self-pay | Admitting: Pulmonary Disease

## 2024-10-05 ENCOUNTER — Ambulatory Visit: Admitting: Pulmonary Disease

## 2024-10-05 ENCOUNTER — Ambulatory Visit

## 2024-10-05 VITALS — BP 136/78 | HR 105 | Temp 97.8°F | Ht 66.25 in | Wt 306.0 lb

## 2024-10-05 DIAGNOSIS — R0602 Shortness of breath: Secondary | ICD-10-CM

## 2024-10-05 DIAGNOSIS — J449 Chronic obstructive pulmonary disease, unspecified: Secondary | ICD-10-CM

## 2024-10-05 DIAGNOSIS — I272 Pulmonary hypertension, unspecified: Secondary | ICD-10-CM

## 2024-10-05 DIAGNOSIS — G4733 Obstructive sleep apnea (adult) (pediatric): Secondary | ICD-10-CM | POA: Diagnosis not present

## 2024-10-05 LAB — PULMONARY FUNCTION TEST
DL/VA % pred: 107 %
DL/VA: 4.38 ml/min/mmHg/L
DLCO unc % pred: 86 %
DLCO unc: 18.04 ml/min/mmHg
FEF 25-75 Post: 1.98 L/s
FEF 25-75 Pre: 2.08 L/s
FEF2575-%Change-Post: -4 %
FEF2575-%Pred-Post: 99 %
FEF2575-%Pred-Pre: 104 %
FEV1-%Change-Post: 0 %
FEV1-%Pred-Post: 85 %
FEV1-%Pred-Pre: 85 %
FEV1-Post: 2.09 L
FEV1-Pre: 2.1 L
FEV1FVC-%Change-Post: 2 %
FEV1FVC-%Pred-Pre: 105 %
FEV6-%Change-Post: -3 %
FEV6-%Pred-Post: 82 %
FEV6-%Pred-Pre: 85 %
FEV6-Post: 2.56 L
FEV6-Pre: 2.64 L
FEV6FVC-%Pred-Post: 104 %
FEV6FVC-%Pred-Pre: 104 %
FVC-%Change-Post: -3 %
FVC-%Pred-Post: 78 %
FVC-%Pred-Pre: 81 %
FVC-Post: 2.56 L
FVC-Pre: 2.64 L
Post FEV1/FVC ratio: 82 %
Post FEV6/FVC ratio: 100 %
Pre FEV1/FVC ratio: 80 %
Pre FEV6/FVC Ratio: 100 %
RV % pred: 103 %
RV: 2.41 L
TLC % pred: 93 %
TLC: 5.03 L

## 2024-10-05 NOTE — Patient Instructions (Addendum)
 We will schedule him for sleep study - This should be done when you are feeling at your usual so if you are still having a lot of pain and discomfort you may want to reschedule  Your breathing study as we looked at today is within normal limits  Continue Breztri   Use nebulizer as needed  Graded activities as tolerated  Tentative follow-up in about 3 months  Call us  with significant concerns

## 2024-10-05 NOTE — Patient Instructions (Signed)
 Full pft performed today

## 2024-10-05 NOTE — Progress Notes (Signed)
 "              Anita James    [id number]    1951/02/10  Primary Care Physician:Jai Jerrold Costa, NP  Referring Physician: Neda Jennet LABOR, MD [address] [place],  [place] [address]  Chief complaint:  Patient being seen for shortness of breath Obstructive sleep apnea  Discussed the use of AI scribe software for clinical note transcription with the patient, who gave verbal consent to proceed.  History of Present Illness Anita James is a 74 year old female who presents for follow-up regarding her CPAP use and sleep study.  She has been experiencing issues with her CPAP machine, which was adjusted during her last visit. She is unsure if these adjustments have made a significant difference. There was a discussion about undergoing a sleep study if the CPAP adjustments were ineffective.  She was hospitalized in September and subsequently developed left leg drop foot, resulting in no feeling from the knee down. She is undergoing spinal block procedures on her back and hip to alleviate pressure and improve her ability to walk. She has been prescribed two types of braces, which she is scheduled to pick up next week.  In the past two weeks, she has developed shingles, marking her seventh episode. This is the first time the shingles have broken out on her skin, despite having received the shingles vaccine. The current outbreak is located from her right side around to her spine. She is concerned about the potential impact on her lungs.  A recent breathing study showed normal results. Despite this, she experiences shortness of breath.  She has obstructive sleep apnea - Has not been compliant with CPAP During the last visit we have discussed optimizing CPAP pressures and also possibility of obtaining a new sleep study to ascertain severity of sleep disordered breathing are not optimally titrated to tolerable pressures  She is in agreement to have this done  Continues to use a  nebulizer, inhalers on a regular basis  Gets fatigued easily  Outpatient Encounter Medications as of 10/05/2024  Medication Sig   Accu-Chek FastClix Lancets MISC USE AS DIRECTED TO CHECK BLOOD SUGAR THREE TIMES DAILY   ACCU-CHEK GUIDE test strip USE AS DIRECTED TO CHECK BLOOD SUGAR THREE TIMES DAILY   albuterol  (ACCUNEB ) 0.63 MG/3ML nebulizer solution Take 1 ampule by nebulization 3 (three) times daily as needed for wheezing or shortness of breath.   albuterol  (VENTOLIN  HFA) 108 (90 Base) MCG/ACT inhaler Inhale 2 puffs into the lungs 3 (three) times daily as needed.   allopurinol  (ZYLOPRIM ) 100 MG tablet Take 100 mg by mouth daily.   aspirin  EC 81 MG tablet Take 81 mg by mouth daily.   atorvastatin  (LIPITOR ) 40 MG tablet Take 40 mg by mouth at bedtime.   baclofen  (LIORESAL ) 10 MG tablet Take 1 tablet (10 mg total) by mouth 3 (three) times daily as needed for muscle spasms (if no relief after flexeril ).   Blood Glucose Monitoring Suppl (ACCU-CHEK AVIVA PLUS) w/Device KIT USE AS DIRECTED TO TEST TWICE DAILY   Blood Pressure Monitoring KIT Use daily.   Budeson-Glycopyrrol-Formoterol  (BREZTRI  AEROSPHERE) 160-9-4.8 MCG/ACT AERO Inhale 2 puffs into the lungs in the morning and at bedtime.   carvedilol  (COREG ) 25 MG tablet Take 25 mg by mouth 2 (two) times daily.   cetirizine  (ZYRTEC ) 10 MG tablet Take 10 mg by mouth daily as needed for allergies.   Continuous Glucose Sensor (FREESTYLE LIBRE 3 PLUS SENSOR) MISC SMARTSIG:Every 3  Days   CRANBERRY PO Take 1 tablet by mouth daily.   cyclobenzaprine  (FLEXERIL ) 10 MG tablet Take 1 tablet (10 mg total) by mouth 3 (three) times daily as needed for muscle spasms.   diclofenac  sodium (VOLTAREN ) 1 % GEL Apply 4 g topically 4 (four) times daily. (Patient taking differently: Apply 4 g topically daily as needed (pain).)   empagliflozin  (JARDIANCE ) 10 MG TABS tablet Take 10 mg by mouth daily.   feeding supplement, GLUCERNA SHAKE, (GLUCERNA SHAKE) LIQD Take 237 mLs  by mouth every morning.   fluticasone  (FLONASE ) 50 MCG/ACT nasal spray PLACE 2 SPRAYS IN EACH NOSTRIL DAILY AS NEEDED FOR ALLERGIES   HYDROcodone -acetaminophen  (NORCO/VICODIN) 5-325 MG tablet Take 2 tablets by mouth every 4 (four) hours as needed.   insulin  aspart (NOVOLOG  FLEXPEN) 100 UNIT/ML FlexPen Inject 60 Units into the skin 3 (three) times daily with meals. (Patient taking differently: Inject 20-45 Units into the skin 3 (three) times daily after meals.)   Insulin  Glargine (LANTUS  SOLOSTAR) 100 UNIT/ML Solostar Pen Inject 84 Units into the skin daily. (Patient taking differently: Inject 100 Units into the skin daily.)   Insulin  Pen Needle (TRUEPLUS PEN NEEDLES) 32G X 4 MM MISC Use to inject insulin .   levothyroxine  (SYNTHROID ) 100 MCG tablet Take 1 tablet (100 mcg total) by mouth daily.   lidocaine  (LIDODERM ) 5 % Place 1 patch onto the skin every 12 (twelve) hours. Remove & Discard patch within 12 hours or as directed by MD (Patient taking differently: Place 1 patch onto the skin daily as needed (for pain).)   NOVOFINE 32G X 6 MM MISC SMARTSIG:1 SUB-Q 4-5 Times Daily   NYSTATIN  powder APPLY TOPICALLY 4 TIMES DAILY. (Patient taking differently: Apply 1 Bottle topically 4 (four) times daily as needed (rash).)   omeprazole (PRILOSEC) 20 MG capsule Take 20 mg by mouth daily.   pregabalin  (LYRICA ) 300 MG capsule Take 300 mg by mouth daily.   PREMARIN vaginal cream Place 1 applicator vaginally daily as needed (dryness).   torsemide  (DEMADEX ) 10 MG tablet Take 1 tablet (10 mg total) by mouth daily.   torsemide  (DEMADEX ) 20 MG tablet Take 1 tablet (20 mg total) by mouth every other day. If weight gain of 3-5 pounds in 24 hour period, take daily for 2-3 days   Zinc Acetate 50 MG CAPS Take 1 capsule by mouth in the morning and at bedtime.   No facility-administered encounter medications on file as of 10/05/2024.    Allergies as of 10/05/2024 - Review Complete 10/05/2024  Allergen Reaction Noted    Amoxicillin-pot clavulanate Nausea And Vomiting and Other (See Comments) 08/07/2011   Oxycodone  Shortness Of Breath 11/13/2016   Penicillins Anaphylaxis 11/05/2023   Sulfa antibiotics Shortness Of Breath 08/07/2011   Norvasc  [amlodipine  besylate] Swelling 09/08/2016   Sulfamethoxazole Nausea Only and Other (See Comments) 05/24/2013   Tramadol  Other (See Comments) 11/26/2016   Amoxicillin Rash 11/22/2019    Past Medical History:  Diagnosis Date   Abdominal pain    Anxiety    Carpal tunnel syndrome 06/14/2014   Chest pain    CHF (congestive heart failure) (HCC)    Chronic back pain    Chronic kidney disease 03/2013   failure due to meds   Diabetes mellitus type II, uncontrolled    Diverticulitis    Frequent headaches    Hiatal hernia    Hyperlipemia    Hypertension    Hypothyroidism    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   Neuropathy  Obesity    Pneumonia    Vitamin D  deficiency 06/05/2014    Past Surgical History:  Procedure Laterality Date   CESAREAN SECTION     COLONOSCOPY WITH PROPOFOL  N/A 07/01/2021   Procedure: COLONOSCOPY WITH PROPOFOL ;  Surgeon: Saintclair Jasper, MD;  Location: [facility];  Service: Gastroenterology;  Laterality: N/A;   KNEE SURGERY Left    THYROID SURGERY     goiter    Family History  Problem Relation Age of Onset   Cancer Mother        cervical- mets   Breast cancer Mother    Cancer Father    Heart disease Father    Diabetes Father    Diabetes Sister    Breast cancer Sister    Heart disease Sister    Breast cancer Sister    Heart disease Sister    Breast cancer Sister    Breast cancer Sister    COPD Sister    Breast cancer Sister    Colon cancer Neg Hx    Esophageal cancer Neg Hx    Rectal cancer Neg Hx    Stomach cancer Neg Hx     Social History   Socioeconomic History   Marital status: Divorced    Spouse name: Not on file   Number of children: 6   Years of education: BA   Highest education level: Not on file   Occupational History   Occupation: unemployed  Tobacco Use   Smoking status: Never   Smokeless tobacco: Never  Vaping Use   Vaping status: Never Used  Substance and Sexual Activity   Alcohol use: No   Drug use: No   Sexual activity: Not on file  Other Topics Concern   Not on file  Social History Narrative   Are you right handed or left handed? Right   Are you currently employed ?    What is your current occupation? Works 4 hours a week   Do you live at home alone?   Who lives with you? daughter   What type of home do you live in: 1 story or 2 story? one   Caffeine  1 glass of tea     Social Drivers of Health   Tobacco Use: Low Risk (10/05/2024)   Patient History    Smoking Tobacco Use: Never    Smokeless Tobacco Use: Never    Passive Exposure: Not on file  Financial Resource Strain: Not on file  Food Insecurity: No Food Insecurity (06/16/2024)   Epic    Worried About Programme Researcher, Broadcasting/film/video in the Last Year: Never true    Ran Out of Food in the Last Year: Never true  Transportation Needs: No Transportation Needs (06/16/2024)   Epic    Lack of Transportation (Medical): No    Lack of Transportation (Non-Medical): No  Physical Activity: Not on file  Stress: Not on file  Social Connections: Socially Isolated (06/16/2024)   Social Connection and Isolation Panel    Frequency of Communication with Friends and Family: More than three times a week    Frequency of Social Gatherings with Friends and Family: Once a week    Attends Religious Services: Never    Database Administrator or Organizations: No    Attends Banker Meetings: Never    Marital Status: Divorced  Catering Manager Violence: Not At Risk (06/16/2024)   Epic    Fear of Current or Ex-Partner: No    Emotionally Abused: No    Physically  Abused: No    Sexually Abused: No  Depression (PHQ2-9): Not on file  Alcohol Screen: Not on file  Housing: Low Risk (06/16/2024)   Epic    Unable to Pay for Housing in the  Last Year: No    Number of Times Moved in the Last Year: 0    Homeless in the Last Year: No  Utilities: Not At Risk (06/16/2024)   Epic    Threatened with loss of utilities: No  Health Literacy: Not on file    Review of Systems  Respiratory:  Positive for apnea and shortness of breath.   Musculoskeletal:  Positive for arthralgias.  Skin:  Positive for rash.  Psychiatric/Behavioral:  Positive for sleep disturbance.     Vitals:   10/05/24 1606  BP: 136/78  Pulse: (!) 105  Temp: 97.8 F (36.6 C)  SpO2: 96%     Physical Exam Constitutional:      Appearance: She is obese.  HENT:     Head: Normocephalic.     Nose: Nose normal.     Mouth/Throat:     Mouth: Mucous membranes are moist.  Eyes:     General: No scleral icterus. Cardiovascular:     Rate and Rhythm: Normal rate and regular rhythm.     Heart sounds: No murmur heard.    No friction rub.  Pulmonary:     Effort: No respiratory distress.     Breath sounds: No stridor. No wheezing or rhonchi.  Musculoskeletal:     Cervical back: No rigidity or tenderness.  Neurological:     Mental Status: She is alert.  Psychiatric:        Mood and Affect: Mood normal.    Data Reviewed: Compliance data from CPAP Has not been using it  PFT from 10/05/2024 - Normal spirometry, no obstruction, no restriction, normal diffusing capacity   Assessment and Plan Assessment & Plan Obstructive sleep apnea Previous CPAP adjustments. Normal pulmonary function tests indicate no underlying lung disease. Shortness of breath likely multifactorial, including pain and fatigue. CPAP remains the primary treatment, with surgical options being less favorable due to potential scarring. CPAP is effective in most cases, but requires fine-tuning and adjustment of pressures. Surgical options are less favorable due to potential scarring and healing issues. - Ordered sleep study to assess current severity of sleep apnea and adjust CPAP settings. -  Continue CPAP therapy with adjustments as needed. - Educated on the importance of sleep quality for accurate sleep study results.  Obstructive lung disease Continue Breztri  - Continue nebulization treatments  Shortness of breath - This is multifactorial, has a history of diastolic heart failure, class III obesity, musculoskeletal limitations, dropfoot - Continue inhalers  Pulmonary hypertension likely group 2 and 3   Orders Placed This Encounter  Procedures   Split night study    Standing Status:   Future    Expiration Date:   10/05/2025    Where should this test be performed::   [facility]    I personally spent a total of 32 minutes in the care of the patient today including preparing to see the patient, getting/reviewing separately obtained history, performing a medically appropriate exam/evaluation, counseling and educating, placing orders, documenting clinical information in the EHR, and independently interpreting results.   Jennet Epley MD  Pulmonary and Critical Care 10/05/2024, 4:26 PM  CC: Epley Jennet LABOR, MD   "

## 2024-10-05 NOTE — Progress Notes (Signed)
 Full pft performed today

## 2024-10-18 ENCOUNTER — Encounter

## 2024-10-25 ENCOUNTER — Other Ambulatory Visit: Payer: Self-pay

## 2024-10-25 ENCOUNTER — Emergency Department (HOSPITAL_COMMUNITY)

## 2024-10-25 ENCOUNTER — Encounter (HOSPITAL_COMMUNITY): Payer: Self-pay

## 2024-10-25 ENCOUNTER — Emergency Department (HOSPITAL_COMMUNITY)
Admission: EM | Admit: 2024-10-25 | Discharge: 2024-10-25 | Disposition: A | Discharge status: Home / Self Care | Attending: Emergency Medicine | Admitting: Emergency Medicine

## 2024-10-25 DIAGNOSIS — K219 Gastro-esophageal reflux disease without esophagitis: Secondary | ICD-10-CM | POA: Insufficient documentation

## 2024-10-25 DIAGNOSIS — Z7982 Long term (current) use of aspirin: Secondary | ICD-10-CM | POA: Insufficient documentation

## 2024-10-25 DIAGNOSIS — R109 Unspecified abdominal pain: Secondary | ICD-10-CM

## 2024-10-25 DIAGNOSIS — R0602 Shortness of breath: Secondary | ICD-10-CM | POA: Insufficient documentation

## 2024-10-25 DIAGNOSIS — Z794 Long term (current) use of insulin: Secondary | ICD-10-CM | POA: Insufficient documentation

## 2024-10-25 DIAGNOSIS — D72829 Elevated white blood cell count, unspecified: Secondary | ICD-10-CM | POA: Insufficient documentation

## 2024-10-25 LAB — BASIC METABOLIC PANEL WITH GFR
Anion gap: 10 (ref 5–15)
BUN: 19 mg/dL (ref 8–23)
CO2: 24 mmol/L (ref 22–32)
Calcium: 8.6 mg/dL — ABNORMAL LOW (ref 8.9–10.3)
Chloride: 104 mmol/L (ref 98–111)
Creatinine, Ser: 1.22 mg/dL — ABNORMAL HIGH (ref 0.44–1.00)
GFR, Estimated: 47 mL/min — ABNORMAL LOW
Glucose, Bld: 285 mg/dL — ABNORMAL HIGH (ref 70–99)
Potassium: 4.3 mmol/L (ref 3.5–5.1)
Sodium: 138 mmol/L (ref 135–145)

## 2024-10-25 LAB — COMPREHENSIVE METABOLIC PANEL WITH GFR
ALT: 10 U/L (ref 0–44)
AST: 16 U/L (ref 15–41)
Albumin: 3.1 g/dL — ABNORMAL LOW (ref 3.5–5.0)
Alkaline Phosphatase: 55 U/L (ref 38–126)
Anion gap: 8 (ref 5–15)
BUN: 20 mg/dL (ref 8–23)
CO2: 27 mmol/L (ref 22–32)
Calcium: 8.5 mg/dL — ABNORMAL LOW (ref 8.9–10.3)
Chloride: 105 mmol/L (ref 98–111)
Creatinine, Ser: 1.23 mg/dL — ABNORMAL HIGH (ref 0.44–1.00)
GFR, Estimated: 47 mL/min — ABNORMAL LOW
Glucose, Bld: 262 mg/dL — ABNORMAL HIGH (ref 70–99)
Potassium: 4.1 mmol/L (ref 3.5–5.1)
Sodium: 140 mmol/L (ref 135–145)
Total Bilirubin: 0.5 mg/dL (ref 0.0–1.2)
Total Protein: 5.4 g/dL — ABNORMAL LOW (ref 6.5–8.1)

## 2024-10-25 LAB — TROPONIN T, HIGH SENSITIVITY
Troponin T High Sensitivity: 43 ng/L — ABNORMAL HIGH (ref 0–19)
Troponin T High Sensitivity: 37 ng/L — ABNORMAL HIGH (ref 0–19)

## 2024-10-25 LAB — CBC
HCT: 38.3 % (ref 36.0–46.0)
Hemoglobin: 13.4 g/dL (ref 12.0–15.0)
MCH: 32.8 pg (ref 26.0–34.0)
MCHC: 35 g/dL (ref 30.0–36.0)
MCV: 93.6 fL (ref 80.0–100.0)
Platelets: 202 10*3/uL (ref 150–400)
RBC: 4.09 MIL/uL (ref 3.87–5.11)
RDW: 13.6 % (ref 11.5–15.5)
WBC: 10.7 10*3/uL — ABNORMAL HIGH (ref 4.0–10.5)
nRBC: 0 % (ref 0.0–0.2)

## 2024-10-25 LAB — PRO BRAIN NATRIURETIC PEPTIDE: Pro Brain Natriuretic Peptide: 585 pg/mL — ABNORMAL HIGH

## 2024-10-25 MED ORDER — IOHEXOL 350 MG/ML SOLN
75.0000 mL | Freq: Once | INTRAVENOUS | Status: AC | PRN
  Administered 2024-10-25: 75 mL via INTRAVENOUS

## 2024-10-25 MED ORDER — FAMOTIDINE 40 MG PO TABS: 40.0000 mg | ORAL_TABLET | Freq: Every day | ORAL | 0 refills | Status: AC

## 2024-10-25 MED ORDER — METOCLOPRAMIDE HCL 5 MG/ML IJ SOLN
10.0000 mg | Freq: Once | INTRAMUSCULAR | Status: AC
  Administered 2024-10-25: 10 mg via INTRAVENOUS
  Filled 2024-10-25: qty 2

## 2024-10-25 MED ORDER — PANTOPRAZOLE SODIUM 40 MG PO TBEC: 40.0000 mg | DELAYED_RELEASE_TABLET | Freq: Every day | ORAL | 0 refills | Status: DC

## 2024-10-25 MED ORDER — LACTATED RINGERS IV BOLUS
1000.0000 mL | Freq: Once | INTRAVENOUS | Status: AC
  Administered 2024-10-25: 1000 mL via INTRAVENOUS

## 2024-10-25 MED ORDER — ONDANSETRON HCL 4 MG PO TABS: 4.0000 mg | ORAL_TABLET | Freq: Three times a day (TID) | ORAL | 0 refills | Status: AC | PRN

## 2024-10-25 MED ORDER — PANTOPRAZOLE SODIUM 40 MG IV SOLR
40.0000 mg | Freq: Once | INTRAVENOUS | Status: AC
  Administered 2024-10-25: 40 mg via INTRAVENOUS
  Filled 2024-10-25: qty 10

## 2024-10-25 MED ORDER — ONDANSETRON HCL 4 MG/2ML IJ SOLN
4.0000 mg | Freq: Once | INTRAMUSCULAR | Status: AC | PRN
  Administered 2024-10-25: 4 mg via INTRAVENOUS
  Filled 2024-10-25: qty 2

## 2024-10-25 NOTE — ED Notes (Signed)
 [facility] arranged for the patient to return home.  ETA: within 30 minutes. RN updated with transport status.

## 2024-10-25 NOTE — ED Provider Notes (Signed)
 I took over care of patient at 7 AM. She has normal lab work and on reevaluation feeling much better. She is on cpap which she uses at home.  Physical Exam  BP (!) 162/78   Pulse (!) 101   Temp 98.2 F (36.8 C) (Oral)   Resp (!) 22   Ht 5' 6 (1.676 m)   Wt (!) 138.8 kg   SpO2 100%   BMI 49.39 kg/m   Physical Exam Vitals and nursing note reviewed.  Constitutional:      General: She is not in acute distress.    Appearance: She is well-developed. She is obese.  HENT:     Head: Normocephalic and atraumatic.  Eyes:     Conjunctiva/sclera: Conjunctivae normal.  Cardiovascular:     Rate and Rhythm: Normal rate and regular rhythm.     Heart sounds: No murmur heard. Pulmonary:     Effort: Pulmonary effort is normal. No respiratory distress.     Breath sounds: Normal breath sounds.  Abdominal:     Palpations: Abdomen is soft.     Tenderness: There is no abdominal tenderness.  Musculoskeletal:        General: No swelling.     Cervical back: Neck supple.  Skin:    General: Skin is warm and dry.     Capillary Refill: Capillary refill takes less than 2 seconds.  Neurological:     Mental Status: She is alert.  Psychiatric:        Mood and Affect: Mood normal.     Procedures  Procedures  ED Course / MDM    Medical Decision Making Patient with negative lab work and CT scan.  She was given Protonix  and multiple medications here for symptoms.  Sleeping on CPAP and feeling much better.  Will start her on Pepcid  in addition to her Protonix .  Given Zofran  to use as needed for nausea and vomiting.  Advised close follow-up with primary care and otherwise return to the ER for new or worsening symptoms.  She feels comfortable to plan be discharged home.  Problems Addressed: Abdominal pain, non-surgical: acute illness or injury Gastroesophageal reflux disease without esophagitis: chronic illness or injury Shortness of breath: acute illness or injury  Amount and/or Complexity of Data  Reviewed Labs: ordered. Decision-making details documented in ED Course. Radiology: ordered and independent interpretation performed. Decision-making details documented in ED Course.  Risk OTC drugs. Prescription drug management. Drug therapy requiring intensive monitoring for toxicity. Diagnosis or treatment significantly limited by social determinants of health.          Gennaro Bouchard L, DO 10/25/24 1301

## 2024-10-25 NOTE — Discharge Instructions (Addendum)
 Take your Pepcid  as prescribed and your Zofran  as needed.  You should continue your omeprazole.  Wear your CPAP as recommended.  Follow-up with primary care in 1 to 2 weeks.  Return to the ER for new or worsening symptoms.

## 2024-10-25 NOTE — ED Notes (Signed)
 Patient transported to CT

## 2024-10-25 NOTE — ED Triage Notes (Signed)
 Pt BIB [facility] from home due to a fall. Pt states she was trying to take CPAP off to go to the bathroom when she slid out of bed and onto the floor onto her bottom. Pt denies hitting her head, LOC, and use of blood thinners.  Pt also is c/o chest pain, abdominal pain, n/v, and SHOB x1 day. A&Ox4.   En route: 4 mg zofran  and 324 mg Aspirin    [facility] vitals  170/109 BP  106 HR  100% sp02   361 cbg   18 G RAC

## 2024-10-25 NOTE — ED Provider Notes (Signed)
 "  EMERGENCY DEPARTMENT AT [facility] Provider Note   CSN: [id number] Arrival date & time: 10/25/24  [id number]     Patient presents with: Shortness of Breath (Chest pain, fall )   Anita James is a 74 y.o. female.   70 female that presents to the ER today mostly for abdominal pain and vomiting.  Patient states that she has been vomiting at night approximately 1-2 times each night for the last couple weeks.  Does not happen during the day.  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  No bowel obstruction.  Soundly tonight she had intermittent diarrhea and slipped on it and fell.  She was not on her CPAP which she normally is at night.  A family had to call EMS to help her get up and EMS arrival they brought her to the ER.  Triage notes that shortness of breath patient states that she is short of breath because she does not have her CPAP on.  She is on nasal cannula right now and oxygen  is fine but she says she does not wear oxygen  during the day.  Oxygen  was shut off and her sats remained above 95.  No fevers.  No constipation.  Currently no plan for surgery on the large ventral hernia.  She states she did have shingles recently in January.   Shortness of Breath      Prior to Admission medications  Medication Sig Start Date End Date Taking? Authorizing Provider  Accu-Chek FastClix Lancets MISC USE AS DIRECTED TO CHECK BLOOD SUGAR THREE TIMES DAILY 11/07/19   Vicci Barnie NOVAK, MD  ACCU-CHEK GUIDE test strip USE AS DIRECTED TO CHECK BLOOD SUGAR THREE TIMES DAILY 10/04/19   Vicci Barnie NOVAK, MD  albuterol  (ACCUNEB ) 0.63 MG/3ML nebulizer solution Take 1 ampule by nebulization 3 (three) times daily as needed for wheezing or shortness of breath. 01/07/21   [provider]  albuterol  (VENTOLIN  HFA) 108 (90 Base) MCG/ACT inhaler Inhale 2 puffs into the lungs 3 (three) times daily as needed. 03/23/24   [provider]   allopurinol  (ZYLOPRIM ) 100 MG tablet Take 100 mg by mouth daily.    [provider]  aspirin  EC 81 MG tablet Take 81 mg by mouth daily. 11/20/16   [provider]  atorvastatin  (LIPITOR ) 40 MG tablet Take 40 mg by mouth at bedtime. 03/08/24   [provider]  baclofen  (LIORESAL ) 10 MG tablet Take 1 tablet (10 mg total) by mouth 3 (three) times daily as needed for muscle spasms (if no relief after flexeril ). 06/20/24   Patsy Lenis, MD  Blood Glucose Monitoring Suppl (ACCU-CHEK AVIVA PLUS) w/Device KIT USE AS DIRECTED TO TEST TWICE DAILY 04/27/18   Vicci Barnie NOVAK, MD  Blood Pressure Monitoring KIT Use daily. 12/24/23   Court Dorn PARAS, MD  Budeson-Glycopyrrol-Formoterol  (BREZTRI  AEROSPHERE) 160-9-4.8 MCG/ACT AERO Inhale 2 puffs into the lungs in the morning and at bedtime.    [provider]  carvedilol  (COREG ) 25 MG tablet Take 25 mg by mouth 2 (two) times daily.    [provider]  cetirizine  (ZYRTEC ) 10 MG tablet Take 10 mg by mouth daily as needed for allergies.    [provider]  Continuous Glucose Sensor (FREESTYLE LIBRE 3 PLUS SENSOR) MISC SMARTSIG:Every 3 Days 04/04/24   [provider]  CRANBERRY PO Take 1 tablet by mouth daily.    [provider]  cyclobenzaprine  (FLEXERIL ) 10  MG tablet Take 1 tablet (10 mg total) by mouth 3 (three) times daily as needed for muscle spasms. 06/20/24   Patsy Lenis, MD  diclofenac  sodium (VOLTAREN ) 1 % GEL Apply 4 g topically 4 (four) times daily. Patient taking differently: Apply 4 g topically daily as needed (pain). 02/27/19   Emil Share, DO  empagliflozin  (JARDIANCE ) 10 MG TABS tablet Take 10 mg by mouth daily.    [provider]  feeding supplement, GLUCERNA SHAKE, (GLUCERNA SHAKE) LIQD Take 237 mLs by mouth every morning.    [provider]  fluticasone  (FLONASE ) 50 MCG/ACT nasal spray PLACE 2 SPRAYS IN EACH NOSTRIL DAILY AS NEEDED FOR ALLERGIES 07/21/19   Vicci Barnie NOVAK, MD  HYDROcodone -acetaminophen  (NORCO/VICODIN) 5-325 MG tablet Take 2 tablets by mouth every 4 (four) hours as needed. 05/02/24   Geroldine Berg, MD  insulin  aspart (NOVOLOG  FLEXPEN) 100 UNIT/ML FlexPen Inject 60 Units into the skin 3 (three) times daily with meals. Patient taking differently: Inject 20-45 Units into the skin 3 (three) times daily after meals. 12/05/19   Vicci Barnie NOVAK, MD  Insulin  Glargine (LANTUS  SOLOSTAR) 100 UNIT/ML Solostar Pen Inject 84 Units into the skin daily. Patient taking differently: Inject 100 Units into the skin daily. 10/13/19   Vicci Barnie NOVAK, MD  Insulin  Pen Needle (TRUEPLUS PEN NEEDLES) 32G X 4 MM MISC Use to inject insulin . 10/13/18   Vicci Barnie NOVAK, MD  levothyroxine  (SYNTHROID ) 100 MCG tablet Take 1 tablet (100 mcg total) by mouth daily. 10/13/19   Vicci Barnie NOVAK, MD  lidocaine  (LIDODERM ) 5 % Place 1 patch onto the skin every 12 (twelve) hours. Remove & Discard patch within 12 hours or as directed by MD Patient taking differently: Place 1 patch onto the skin daily as needed (for pain). 10/19/23   Qiao, Jana W, MD  NOVOFINE 32G X 6 MM MISC SMARTSIG:1 SUB-Q 4-5 Times Daily 01/22/20   [provider]  NYSTATIN  powder APPLY TOPICALLY 4 TIMES DAILY. Patient taking differently: Apply 1 Bottle topically 4 (four) times daily as needed (rash). 01/18/17   Marilyne Stephane DASEN, MD  omeprazole (PRILOSEC) 20 MG capsule Take 20 mg by mouth daily.    [provider]  pregabalin  (LYRICA ) 300 MG capsule Take 300 mg by mouth daily. 05/26/24   [provider]  PREMARIN vaginal cream Place 1 applicator vaginally daily as needed (dryness). 03/23/24   [provider]  torsemide  (DEMADEX ) 10 MG tablet Take 1 tablet (10 mg total) by mouth daily. 07/22/24   Myriam Fonda RAMAN, PA-C  torsemide  (DEMADEX ) 20 MG tablet Take 1 tablet (20 mg total) by mouth every other day. If weight gain of 3-5 pounds in 24 hour period, take daily for 2-3 days  06/20/24   Patsy Lenis, MD  Zinc Acetate 50 MG CAPS Take 1 capsule by mouth in the morning and at bedtime.    [provider]    Allergies: Amoxicillin-pot clavulanate, Oxycodone , Penicillins, Sulfa antibiotics, Norvasc  [amlodipine  besylate], Sulfamethoxazole, Tramadol , and Amoxicillin    Review of Systems  Respiratory:  Positive for shortness of breath.     Updated Vital Signs BP (!) 162/78   Pulse 92   Temp 99.1 F (37.3 C) (Oral)   Resp (!) 22   Ht 5' 6 (1.676 m)   Wt (!) 138.8 kg   SpO2 100%   BMI 49.39 kg/m   Physical Exam Vitals and nursing note reviewed.  Constitutional:      Appearance: She is well-developed.  HENT:     Head: Normocephalic and atraumatic.  Cardiovascular:     Rate and Rhythm: Regular rhythm. Tachycardia present.  Pulmonary:     Effort: Tachypnea present. No respiratory distress.     Breath sounds: No stridor. No decreased breath sounds or wheezing.  Abdominal:     General: There is no distension.  Musculoskeletal:     Cervical back: Normal range of motion.  Skin:    General: Skin is warm and dry.  Neurological:     Mental Status: She is alert.     (all labs ordered are listed, but only abnormal results are displayed) Labs Reviewed  BASIC METABOLIC PANEL WITH GFR - Abnormal; Notable for the following components:      Result Value   Glucose, Bld 285 (*)    Creatinine, Ser 1.22 (*)    Calcium  8.6 (*)    GFR, Estimated 47 (*)    All other components within normal limits  CBC - Abnormal; Notable for the following components:   WBC 10.7 (*)    All other components within normal limits  COMPREHENSIVE METABOLIC PANEL WITH GFR - Abnormal; Notable for the following components:   Glucose, Bld 262 (*)    Creatinine, Ser 1.23 (*)    Calcium  8.5 (*)    Total Protein 5.4 (*)    Albumin 3.1 (*)    GFR, Estimated 47 (*)    All other components within normal limits  PRO BRAIN NATRIURETIC PEPTIDE - Abnormal; Notable for the following  components:   Pro Brain Natriuretic Peptide 585.0 (*)    All other components within normal limits  TROPONIN T, HIGH SENSITIVITY - Abnormal; Notable for the following components:   Troponin T High Sensitivity 43 (*)    All other components within normal limits  TROPONIN T, HIGH SENSITIVITY - Abnormal; Notable for the following components:   Troponin T High Sensitivity 37 (*)    All other components within normal limits  URINALYSIS, ROUTINE W REFLEX MICROSCOPIC    EKG: None  Radiology: CT ABDOMEN PELVIS W CONTRAST Result Date: 10/25/2024 EXAM: CT ABDOMEN AND PELVIS WITH CONTRAST 10/25/2024 06:23:04 AM TECHNIQUE: CT of the abdomen and pelvis was performed with the administration of 75 mL of iohexol  (OMNIPAQUE ) 350 MG/ML injection. Multiplanar reformatted images are provided for review. Automated exposure control, iterative reconstruction, and/or weight-based adjustment of the mA/kV was utilized to reduce the radiation dose to as low as reasonably achievable. COMPARISON: Non-contrast CT chest, abdomen, and pelvis 05/02/2024. CLINICAL HISTORY: 74 year old female with acute, nonlocalized abdominal pain after falling out of bed onto the floor. FINDINGS: LOWER CHEST: Mildly lower lung volumes and increased mild atelectasis at the lung bases. LIVER: The liver is unremarkable. GALLBLADDER AND BILE DUCTS: Redundant gallbladder is mildly larger but with no regional inflammation, no CT evidence of gallstones. No bile duct dilatation. SPLEEN: No acute abnormality. PANCREAS: No acute abnormality. ADRENAL GLANDS: No acute abnormality. KIDNEYS, URETERS AND BLADDER: No stones in the kidneys or ureters. No hydronephrosis. No perinephric or periureteral stranding. Urinary bladder is unremarkable. No delayed renal excretory images. Occasional small calcified pelvic phleboliths. GI AND BOWEL: Chronic supraumbilical ventral abdominal hernia is stable from last year measuring about 10 cm and containing the mid segment of  the transverse colon and mesentery. No evidence of incarceration. Nondilated stomach and small bowel. Mildly redundant large bowel. Mild sigmoid colon diverticulosis. Normal gas containing appendix on coronal image 85. Linear and benign appearing mesenteric calcification in the left abdomen on series 2  image 73 is chronic. There is no bowel obstruction. PERITONEUM AND RETROPERITONEUM: No ascites. No free air. VASCULATURE: Aorta is normal in caliber. Suboptimal intravascular contrast but the major arterial structures and portal venous system in the abdomen and pelvis appear patent. Moderate calcified abdominal aortic atherosclerosis. LYMPH NODES: No lymphadenopathy. REPRODUCTIVE ORGANS: Small volume gas within the vagina, similar to the previous exam. Negative uterus and adnexa. BONES AND SOFT TISSUES: Spinal disc and endplate degeneration with intermittent vacuum disc. No acute osseous abnormality identified. Asymmetric new right ventral abdominal wall subcutaneous density and stranding over an area of greater than 20 cm (series 2 image 53). Due to body habitus the entire left abdominal wall is not included. No abdominal wall soft tissue gas identified. Mildly asymmetric flank subcutaneous edema also suspected. IMPRESSION: 1. Broad based right ventral abdominal wall subcutaneous stranding is nonspecific but more resembles cellulitis than abdominal wall injury. 2. No acute traumatic injury identified in the abdomen or pelvis. 3. Stable chronic supraumbilical ventral hernia (10 cm) containing transverse colon. No incarceration. 4. Aortic Atherosclerosis (ICD10-I70.0). Electronically signed by: Helayne Hurst MD 10/25/2024 06:44 AM EST RP Workstation: [id number]   DG Chest 2 View Result Date: 10/25/2024 EXAM: AP AND LATERAL (2) VIEW(S) XRAY OF THE CHEST 10/25/2024 04:20:00 AM COMPARISON: AP AND LATERAL CHEST DATED 07/22/2024. CLINICAL HISTORY: Chest pain. Chest pain. FINDINGS: LUNGS AND PLEURA: The lungs are mildly  demineralized. No focal pulmonary opacity. No pleural effusion. No pneumothorax. HEART AND MEDIASTINUM: The heart is moderately enlarged. No vascular congestion is seen. Mediastinum stable with calcification of the transverse aorta. BONES AND SOFT TISSUES: No acute osseous abnormality. IMPRESSION: 1. No acute findings. 2. Moderately enlarged heart without vascular congestion. Electronically signed by: Francis Quam MD 10/25/2024 05:05 AM EST RP Workstation: [id number]     Procedures   Medications Ordered in the ED  pantoprazole  (PROTONIX ) injection 40 mg (has no administration in time range)  ondansetron  (ZOFRAN ) injection 4 mg (4 mg Intravenous Given 10/25/24 0459)  metoCLOPramide  (REGLAN ) injection 10 mg (10 mg Intravenous Given 10/25/24 0703)  iohexol  (OMNIPAQUE ) 350 MG/ML injection 75 mL (75 mLs Intravenous Contrast Given 10/25/24 0623)  lactated ringers  bolus 1,000 mL (1,000 mLs Intravenous New Bag/Given 10/25/24 0704)                                    Medical Decision Making Amount and/or Complexity of Data Reviewed Labs: ordered. Radiology: ordered.  Risk Prescription drug management.   Her symptoms are mostly GI I think the sob is from not being on her CPAP. Will order same. BNP is fine, xr without edema, lungs clear.   Suspect the emesis is likely from GERD, would double it for the next couple weeks and add on zofran  and/or pepcid  as well. CT scan without hernia complications. Questionable cellulitis but on physical exam there is no erythema, induration. 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   Started some fluids, protonix , zofran  here. Think she can probably go home as long as PO improves.   Care transferred pending reevaluation and disposition.    Evlyn Amason, Selinda, MD 10/25/24 [phone]  "

## 2024-12-06 ENCOUNTER — Ambulatory Visit (HOSPITAL_BASED_OUTPATIENT_CLINIC_OR_DEPARTMENT_OTHER): Admitting: Pulmonary Disease

## 2025-01-15 ENCOUNTER — Ambulatory Visit: Admitting: Pulmonary Disease
